# Patient Record
Sex: Female | Born: 1950 | Race: White | Hispanic: No | Marital: Married | Smoking: Former smoker
Health system: Southern US, Community
[De-identification: ages and names within clinical notes are randomized; demographics above are authoritative.]

## PROBLEM LIST (undated history)

## (undated) ENCOUNTER — Ambulatory Visit (HOSPITAL_COMMUNITY): Admission: EM | Discharge status: Absent without leave | Payer: 59 | Source: Home / Self Care

## (undated) DIAGNOSIS — B9681 Helicobacter pylori [H. pylori] as the cause of diseases classified elsewhere: Secondary | ICD-10-CM

## (undated) DIAGNOSIS — K2970 Gastritis, unspecified, without bleeding: Secondary | ICD-10-CM

## (undated) DIAGNOSIS — C539 Malignant neoplasm of cervix uteri, unspecified: Secondary | ICD-10-CM

## (undated) DIAGNOSIS — M810 Age-related osteoporosis without current pathological fracture: Secondary | ICD-10-CM

## (undated) DIAGNOSIS — I7381 Erythromelalgia: Secondary | ICD-10-CM

## (undated) DIAGNOSIS — G629 Polyneuropathy, unspecified: Secondary | ICD-10-CM

## (undated) DIAGNOSIS — Z923 Personal history of irradiation: Secondary | ICD-10-CM

## (undated) DIAGNOSIS — S0240DA Maxillary fracture, left side, initial encounter for closed fracture: Secondary | ICD-10-CM

## (undated) DIAGNOSIS — B029 Zoster without complications: Secondary | ICD-10-CM

## (undated) DIAGNOSIS — M069 Rheumatoid arthritis, unspecified: Secondary | ICD-10-CM

## (undated) DIAGNOSIS — S82141A Displaced bicondylar fracture of right tibia, initial encounter for closed fracture: Secondary | ICD-10-CM

## (undated) DIAGNOSIS — M19049 Primary osteoarthritis, unspecified hand: Secondary | ICD-10-CM

## (undated) DIAGNOSIS — J189 Pneumonia, unspecified organism: Secondary | ICD-10-CM

## (undated) DIAGNOSIS — H269 Unspecified cataract: Secondary | ICD-10-CM

## (undated) DIAGNOSIS — N950 Postmenopausal bleeding: Secondary | ICD-10-CM

## (undated) DIAGNOSIS — Z9889 Other specified postprocedural states: Secondary | ICD-10-CM

## (undated) DIAGNOSIS — Z86010 Personal history of colonic polyps: Secondary | ICD-10-CM

## (undated) DIAGNOSIS — M11279 Other chondrocalcinosis, unspecified ankle and foot: Secondary | ICD-10-CM

## (undated) DIAGNOSIS — K219 Gastro-esophageal reflux disease without esophagitis: Secondary | ICD-10-CM

## (undated) DIAGNOSIS — I10 Essential (primary) hypertension: Secondary | ICD-10-CM

## (undated) DIAGNOSIS — R112 Nausea with vomiting, unspecified: Secondary | ICD-10-CM

## (undated) DIAGNOSIS — T7840XA Allergy, unspecified, initial encounter: Secondary | ICD-10-CM

## (undated) DIAGNOSIS — M8580 Other specified disorders of bone density and structure, unspecified site: Secondary | ICD-10-CM

## (undated) HISTORY — DX: Essential (primary) hypertension: I10

## (undated) HISTORY — DX: Gastro-esophageal reflux disease without esophagitis: K21.9

## (undated) HISTORY — PX: INNER EAR SURGERY: SHX679

## (undated) HISTORY — DX: Other chondrocalcinosis, unspecified ankle and foot: M11.279

## (undated) HISTORY — DX: Rheumatoid arthritis, unspecified: M06.9

## (undated) HISTORY — DX: Personal history of colonic polyps: Z86.010

## (undated) HISTORY — DX: Gastritis, unspecified, without bleeding: K29.70

## (undated) HISTORY — DX: Helicobacter pylori (H. pylori) as the cause of diseases classified elsewhere: B96.81

## (undated) HISTORY — DX: Age-related osteoporosis without current pathological fracture: M81.0

## (undated) HISTORY — DX: Displaced bicondylar fracture of right tibia, initial encounter for closed fracture: S82.141A

## (undated) HISTORY — DX: Maxillary fracture, left side, initial encounter for closed fracture: S02.40DA

## (undated) HISTORY — DX: Allergy, unspecified, initial encounter: T78.40XA

## (undated) HISTORY — DX: Unspecified cataract: H26.9

## (undated) HISTORY — PX: FRACTURE SURGERY: SHX138

## (undated) HISTORY — DX: Other specified disorders of bone density and structure, unspecified site: M85.80

## (undated) HISTORY — PX: COLONOSCOPY: SHX174

## (undated) HISTORY — DX: Primary osteoarthritis, unspecified hand: M19.049

## (undated) HISTORY — PX: COSMETIC SURGERY: SHX468

---

## 1970-08-19 HISTORY — PX: BREAST BIOPSY: SHX20

## 1998-05-31 ENCOUNTER — Encounter: Payer: Self-pay | Admitting: Family Medicine

## 1998-05-31 ENCOUNTER — Ambulatory Visit (HOSPITAL_COMMUNITY): Admission: RE | Admit: 1998-05-31 | Discharge: 1998-05-31 | Payer: Self-pay | Admitting: Family Medicine

## 1998-06-06 ENCOUNTER — Ambulatory Visit (HOSPITAL_COMMUNITY): Admission: RE | Admit: 1998-06-06 | Discharge: 1998-06-06 | Payer: Self-pay | Admitting: Family Medicine

## 1998-06-06 ENCOUNTER — Encounter: Payer: Self-pay | Admitting: Family Medicine

## 1999-11-26 ENCOUNTER — Other Ambulatory Visit: Admission: RE | Admit: 1999-11-26 | Discharge: 1999-11-26 | Payer: Self-pay | Admitting: *Deleted

## 2000-08-22 ENCOUNTER — Encounter: Admission: RE | Admit: 2000-08-22 | Discharge: 2000-08-22 | Payer: Self-pay | Admitting: Family Medicine

## 2000-08-22 ENCOUNTER — Encounter: Payer: Self-pay | Admitting: Family Medicine

## 2000-09-08 ENCOUNTER — Ambulatory Visit (HOSPITAL_BASED_OUTPATIENT_CLINIC_OR_DEPARTMENT_OTHER): Admission: RE | Admit: 2000-09-08 | Discharge: 2000-09-08 | Payer: Self-pay | Admitting: General Surgery

## 2000-12-17 ENCOUNTER — Other Ambulatory Visit: Admission: RE | Admit: 2000-12-17 | Discharge: 2000-12-17 | Payer: Self-pay | Admitting: *Deleted

## 2001-02-04 ENCOUNTER — Encounter: Admission: RE | Admit: 2001-02-04 | Discharge: 2001-02-04 | Payer: Self-pay | Admitting: Family Medicine

## 2001-02-04 ENCOUNTER — Encounter: Payer: Self-pay | Admitting: Family Medicine

## 2004-05-09 ENCOUNTER — Emergency Department (HOSPITAL_COMMUNITY): Admission: EM | Admit: 2004-05-09 | Discharge: 2004-05-09 | Payer: Self-pay | Admitting: Emergency Medicine

## 2005-08-19 HISTORY — PX: OTHER SURGICAL HISTORY: SHX169

## 2005-11-07 ENCOUNTER — Inpatient Hospital Stay (HOSPITAL_COMMUNITY): Admission: EM | Admit: 2005-11-07 | Discharge: 2005-11-09 | Payer: Self-pay | Admitting: Emergency Medicine

## 2005-11-08 ENCOUNTER — Ambulatory Visit: Payer: Self-pay | Admitting: Cardiology

## 2005-11-08 ENCOUNTER — Encounter: Payer: Self-pay | Admitting: Cardiology

## 2007-09-30 ENCOUNTER — Encounter: Admission: RE | Admit: 2007-09-30 | Discharge: 2007-09-30 | Payer: Self-pay | Admitting: Family Medicine

## 2008-08-19 HISTORY — PX: OTHER SURGICAL HISTORY: SHX169

## 2008-11-16 ENCOUNTER — Emergency Department (HOSPITAL_COMMUNITY): Admission: EM | Admit: 2008-11-16 | Discharge: 2008-11-16 | Payer: Self-pay | Admitting: Emergency Medicine

## 2009-04-19 ENCOUNTER — Encounter: Admission: RE | Admit: 2009-04-19 | Discharge: 2009-04-19 | Payer: Self-pay | Admitting: Family Medicine

## 2009-11-18 ENCOUNTER — Ambulatory Visit: Payer: Self-pay | Admitting: Diagnostic Radiology

## 2009-11-18 ENCOUNTER — Encounter: Payer: Self-pay | Admitting: Emergency Medicine

## 2009-11-18 ENCOUNTER — Inpatient Hospital Stay (HOSPITAL_COMMUNITY): Admission: EM | Admit: 2009-11-18 | Discharge: 2009-11-21 | Payer: Self-pay | Admitting: Emergency Medicine

## 2010-02-09 ENCOUNTER — Ambulatory Visit (HOSPITAL_COMMUNITY): Admission: RE | Admit: 2010-02-09 | Discharge: 2010-02-09 | Payer: Self-pay | Admitting: Otolaryngology

## 2010-03-29 ENCOUNTER — Ambulatory Visit (HOSPITAL_COMMUNITY): Admission: RE | Admit: 2010-03-29 | Discharge: 2010-03-29 | Payer: Self-pay | Admitting: Otolaryngology

## 2010-08-02 ENCOUNTER — Ambulatory Visit: Payer: Self-pay | Admitting: Vascular Surgery

## 2010-09-09 ENCOUNTER — Encounter: Payer: Self-pay | Admitting: Otolaryngology

## 2010-11-02 LAB — CBC
HCT: 44 % (ref 36.0–46.0)
Hemoglobin: 15.5 g/dL — ABNORMAL HIGH (ref 12.0–15.0)
MCH: 28.2 pg (ref 26.0–34.0)
MCHC: 35.2 g/dL (ref 30.0–36.0)
MCV: 80.1 fL (ref 78.0–100.0)
Platelets: 216 K/uL (ref 150–400)
RBC: 5.49 MIL/uL — ABNORMAL HIGH (ref 3.87–5.11)
RDW: 13.3 % (ref 11.5–15.5)
WBC: 6.9 10*3/uL (ref 4.0–10.5)

## 2010-11-02 LAB — BASIC METABOLIC PANEL
BUN: 10 mg/dL (ref 6–23)
CO2: 29 mEq/L (ref 19–32)
Calcium: 9.8 mg/dL (ref 8.4–10.5)
Chloride: 102 mEq/L (ref 96–112)
Creatinine, Ser: 0.91 mg/dL (ref 0.4–1.2)
GFR calc Af Amer: >60 mL/min (ref >60)
GFR calc non Af Amer: >60 mL/min (ref >60)
Glucose, Bld: 111 mg/dL — ABNORMAL HIGH (ref 70–99)
Potassium: 3.5 mEq/L (ref 3.5–5.1)
Sodium: 138 mEq/L (ref 135–145)

## 2010-11-02 LAB — URINALYSIS, ROUTINE W REFLEX MICROSCOPIC
Bilirubin Urine: NEGATIVE
Glucose, UA: NEGATIVE mg/dL
Hgb urine dipstick: NEGATIVE
Ketones, ur: NEGATIVE mg/dL
Nitrite: NEGATIVE
Protein, ur: NEGATIVE mg/dL
Specific Gravity, Urine: 1.016 (ref 1.005–1.030)
Urobilinogen, UA: 0.2 mg/dL (ref 0.0–1.0)
pH: 5 (ref 5.0–8.0)

## 2010-11-02 LAB — URINE MICROSCOPIC-ADD ON

## 2010-11-02 LAB — SURGICAL PCR SCREEN
MRSA, PCR: NEGATIVE
Staphylococcus aureus: NEGATIVE

## 2010-11-04 LAB — CREATININE, SERUM
Creatinine, Ser: 0.88 mg/dL (ref 0.4–1.2)
GFR calc Af Amer: >60 mL/min (ref >60)
GFR calc non Af Amer: >60 mL/min (ref >60)

## 2010-11-04 LAB — BUN: BUN: 11 mg/dL (ref 6–23)

## 2010-11-07 LAB — CBC
HCT: 42.5 % (ref 36.0–46.0)
Hemoglobin: 14.5 g/dL (ref 12.0–15.0)
MCV: 84.8 fL (ref 78.0–100.0)
Platelets: 210 10*3/uL (ref 150–400)
RDW: 13.7 % (ref 11.5–15.5)
WBC: 8.8 10*3/uL (ref 4.0–10.5)

## 2010-11-07 LAB — DIFFERENTIAL
Eosinophils Absolute: 0 10*3/uL (ref 0.0–0.7)
Lymphocytes Relative: 12 % (ref 12–46)
Lymphs Abs: 1.1 10*3/uL (ref 0.7–4.0)
Monocytes Absolute: 0.3 10*3/uL (ref 0.1–1.0)
Monocytes Relative: 3 % (ref 3–12)
Neutrophils Relative %: 85 % — ABNORMAL HIGH (ref 43–77)

## 2010-11-07 LAB — BASIC METABOLIC PANEL
CO2: 25 mEq/L (ref 19–32)
Creatinine, Ser: 0.84 mg/dL (ref 0.4–1.2)
GFR calc Af Amer: >60 mL/min (ref >60)

## 2010-11-29 LAB — CBC
Hemoglobin: 14.9 g/dL (ref 12.0–15.0)
MCHC: 34.4 g/dL (ref 30.0–36.0)
MCV: 84.8 fL (ref 78.0–100.0)
Platelets: 228 10*3/uL (ref 150–400)
RDW: 13 % (ref 11.5–15.5)

## 2010-11-29 LAB — POCT CARDIAC MARKERS
Myoglobin, poc: 58.9 ng/mL (ref 12–200)
Troponin i, poc: <0.05 ng/mL (ref 0.00–0.09)

## 2010-11-29 LAB — BASIC METABOLIC PANEL
Chloride: 98 mEq/L (ref 96–112)
GFR calc Af Amer: >60 mL/min (ref >60)
GFR calc non Af Amer: >60 mL/min (ref >60)
Sodium: 133 mEq/L — ABNORMAL LOW (ref 135–145)

## 2010-11-29 LAB — DIFFERENTIAL
Basophils Relative: 1 % (ref 0–1)
Eosinophils Absolute: 0 10*3/uL (ref 0.0–0.7)
Monocytes Relative: 4 % (ref 3–12)
Neutro Abs: 5.3 10*3/uL (ref 1.7–7.7)
Neutrophils Relative %: 72 % (ref 43–77)

## 2011-01-01 NOTE — Procedures (Signed)
DUPLEX DEEP VENOUS EXAM - LOWER EXTREMITY   INDICATION:  Bilateral lower extremity edema.   HISTORY:  Edema:  Yes.  Trauma/Surgery:  No.  Pain:  No.  PE:  No.  Previous DVT:  No.  Anticoagulants:  No.  Other:   DUPLEX EXAM:                CFV   SFV   PopV  PTV    GSV                R  L  R  L  R  L  R   L  R  L  Thrombosis    o  o  o  o  o  o  o   o  o  o  Spontaneous   +  +  +  +  +  +  +   +  +  +  Phasic        +  +  +  +  +  +  +   +  +  +  Augmentation  +  +  +  +  +  +  +   +  +  +  Compressible  +  +  +  +  +  +  +   +  +  +  Competent     o  o  +  +  +  +  +   +  +  +   Legend:  + - yes  o - no  p - partial  D - decreased   IMPRESSION:  1. Bilateral lower extremities appear negative for deep venous      thrombosis.  2. Bilateral deep venous insufficiency noted at the level of the      common femoral vein.    _____________________________  Janetta Hora. Fields, MD   EM/MEDQ  D:  08/02/2010  T:  08/02/2010  Job:  [id number]

## 2011-01-01 NOTE — Assessment & Plan Note (Signed)
OFFICE VISIT   [name], [name]  DOB:  [birth year]                                       08/02/2010  ZOXWR#:[id number]   CHIEF COMPLAINT:  Leg swelling.   HISTORY OF PRESENT ILLNESS:  The patient is a 60 year old female who has  a chronic history of bilateral lower extremity swelling for several  years.  She also occasionally has some knee pain.  She does not really  describe any claudication symptoms.  She occasionally has some burning  in the front of her foot and toes which usually occurs in the evenings.  She denies any history of diabetes.  She states that the swelling has  slowly become worse over the years as well as the burning and tingling  in her toes.  She denies prior lower extremity trauma.  She denies prior  history of DVT.  She currently is on diuretics to try to improve her leg  swelling and has marginal relief with this.  She has not been as active  in the past year due to a recent injury in her forearm.  She was more  active riding a bicycle prior to this.   CHRONIC MEDICAL PROBLEMS:  Include hypertension.  She is currently on  medication and followed by Dr. Ludwig Clarks for this.   PAST SURGICAL HISTORY:  She had an ear perforation repaired in August of  2011.  She had a left wrist fracture plated in April of 2011 and she had  a right wrist operation in 2007.   SOCIAL HISTORY:  She is an Tree surgeon.  She is married.  She has three  children.  She is a former smoker but quit over 20 years ago.  She does  not consume alcohol regularly.   FAMILY HISTORY:  She denies any history of early atherosclerosis or  abdominal aortic aneurysm.   REVIEW OF SYSTEMS:  Pertinent for recent weight gain.  She also  currently is 5 feet 6 inches, 222 pounds.  CARDIAC:  She has had some palpitations in the past.  URINARY:  She has some occasional dysuria and difficulty urinating.  MUSCULOSKELETAL:  She has joint pain in her knees.  All other systems are  negative.  Please see intake referral form for  details.   MEDICATIONS:  1. Metoprolol 50 mg 2 tablets twice a day.  2. Hydrochlorothiazide 25 mg once daily.  3. Lopid 2.5 mg once daily.  4. Aspirin 81 mg once a day.  5. Alprazolam p.r.n.  6. Naproxen 500 mg p.r.n.   ALLERGIES:  No known drug allergies.   PHYSICAL EXAM:  Vital signs:  Blood pressure is 147/90 in the left arm,  heart rate is 58 and regular, oxygen saturation is 100% on room air.  HEENT:  Unremarkable.  Neck:  Has 2+ carotid pulses without bruit.  Chest:  Clear to auscultation.  Cardiac:  Regular rate and rhythm  without murmur.  Abdomen:  Obese, soft, nontender, nondistended.  No  masses.  Extremities:  She has 2+ femoral, dorsalis pedis and posterior  tibial pulses bilaterally.  She has trace pretibial edema bilaterally.  She has a few clusters of varicosities on the left posterior calf.  She  has no obvious visible varicosities in the right calf.  Musculoskeletal:  She has no obvious major joint deformities.  Neurologic:  Shows  symmetric upper  extremity and lower extremity motor strength which is  5/5 and symmetric.  Sensation seems to be intact in both feet.  Skin:  Has no open ulcers or rashes.   She had a venous duplex ultrasound today which showed no evidence of  DVT.  She did have incompetence of the femoral vein bilaterally.  The  superficial veins did not have incompetence.   In summary, the patient has chronic intermittent leg swelling.  Some of  this is probably secondary to obesity.  I discussed with her today a  weight loss plan as well as increasing her exercise overall.  She also  does have deep vein incompetence and I also discussed with her that  primarily this is treated with medical therapy as there are really no  good surgical options for this.  In light of this I have placed her in  compression stockings today that she should wear as much as possible to  try to decrease the swelling in her  legs and also decrease the venous  back flow into her lower extremities for symptomatic relief.  She will  follow up with me on an as-needed basis.     Janetta Hora. Fields, MD  Electronically Signed   CEF/MEDQ  D:  08/02/2010  T:  08/03/2010  Job:  [id number]   cc:   Henri Medal, MD

## 2011-01-04 NOTE — Discharge Summary (Signed)
NAMEMICHELLE, WNEK NO.:  [id number]   MEDICAL RECORD NO.:  [id number]          PATIENT TYPE:  INP   LOCATION:  3737                         FACILITY:  [facility]   PHYSICIAN:  Nelma Rothman, MD   DATE OF BIRTH:  1950-10-27   DATE OF ADMISSION:  11/07/2005  DATE OF DISCHARGE:  11/09/2005                                 DISCHARGE SUMMARY   PRIMARY CARE PHYSICIAN:  [facility], Marian Sorrow, M.D.   DISCHARGE DIAGNOSES:  1.  Community-acquired pneumonia.  2.  Ventricular ectopy secondary to hypokalemia, resolved.  3.  Hypokalemia, likely secondary to diuretic.  4.  Hypertension.  5.  Dyslipidemia.   PROCEDURES:  The patient did undergo a 2-D echocardiogram on March23, 2007  which demonstrated overall normal ejection fraction being 55-60%.  There  were no regional wall motion abnormalities.  There was grade 1 diastolic  dysfunction and trace to mild aortic valvular regurgitation.  There was mild  mitral regurgitation and a mildly dilated left atrium.   LABORATORY DATA:  On admission, potassium was 3.3, magnesium was 2.0,  creatinine was 0.9.  BNP was mildly elevated at 336.  Total cholesterol 190,  triglycerides 106, HDL 34, LDL 135.  A TSH was 0.702.  Blood cultures are no  growth to date.   HISTORY AND PHYSICAL:  Please see dictated admission history and physical by  Dr. Angelena Sole on March22,2007 for details but briefly [name] is a very  pleasant 60 year old lady with a history of hypertension who presented  directly from the [facility] office for workup of increase  in ventricular ectopy in the setting of community-acquired pneumonia.   HOSPITAL COURSE:  [name] was admitted to the medical unit and monitored  on telemetry.  Chest x-ray done at her primary care physician's office  demonstrated a right middle and right lower lobe pneumonia.  For this, she  was started on intravenous Rocephin and azithromycin.  She did have  a  temperature up to 103.0 on the evening of admission, but has been afebrile  since.  She has remained on room air and has not required any oxygen  throughout the hospitalization.   More concerning to her were these extra heart beats.  EKG revealed normal  sinus rhythm with frequent premature ventricular complexes.  The same was  revealed on telemetry.  Electrolytes revealed a magnesium of 2.0 and low  potassium of 3.3.  TSH was within normal limits.  She was given a total of  80 mEq of potassium chloride over the course of 24 hours and with this the  ectopy was markedly decreased.  In fact, on the morning of discharge, she  had hardly any ectopy at all on telemetry.  Her potassium level had  increased to 3.5.  For completeness sake, a 2-D echocardiogram was done on  March23, 2007 with results as dictated above, but relatively normal.  She  does take Ziac at home for a history of blood pressure.  She was continued  on bisoprolol component of this, given its beta blocking activities, which I  do believe  is beneficial for her should this ventricular ectopy ever become  an issue again.  However, the hydrochlorothiazide component was held during  this hospitalization secondary to hypokalemia.  As this seemed to control  her blood pressure quite well, I think it would be reasonable to resume  taking her Ziac as previously, but with the addition of daily potassium  replacement.  I will discharge her home today in much improved condition  with 20 mEq daily of K-Dur.  She should report to [facility] sometime early next week to have her potassium level checked and  dose adjusted accordingly.  Also, for further cardiac risk stratification,  fasting lipid profile was checked and did demonstrate a mildly elevated LDL  in the 130s.  Given lack of significant family history, personal history of  heart disease, or diabetes, an LDL of 130 would be reasonable, however, the  patient will  discuss with her primary care Clinton Wahlberg possible initiation of  anti-cholesterol medications such as Statin.   DISCHARGE MEDICATIONS:  1.  Avelox 400 mg p.o. daily x7 days.  2.  K-Dur 20 mEq p.o. daily.  3.  She is to resume all of her home medications including Ziac 10/6.25 1      tablets p.o. daily.   She was instructed to return to [facility] sometime early  next week to have a potassium level checked.      Nelma Rothman, MD  Electronically Signed     RAR/MEDQ  D:  11/09/2005  T:  11/11/2005  Job:  [id number]   cc:   Marian Sorrow, M.D.  fax [phone]

## 2011-01-04 NOTE — H&P (Signed)
[name], [name] NO.:  [id number]   MEDICAL RECORD NO.:  [id number]          PATIENT TYPE:  INP   LOCATION:  3737                         FACILITY:  [facility]   PHYSICIAN:  Hettie Holstein, D.O.    DATE OF BIRTH:  [birth year]   DATE OF ADMISSION:  11/07/2005  DATE OF DISCHARGE:                                HISTORY & PHYSICAL   PRIMARY CARE PHYSICIAN:  [facility], Dr. Phillips Odor   HISTORY OF PRESENT ILLNESS:  This is a direct admission from office of Dr.  Phillips Odor.  [name] is a 60 year old female who has been feeling unwell for  the past week.  On Wednesday, she started with a sore throat and a postnasal  drip which has progressed.  She subsequently developed fevers and a  persistent cough, worsening since Sunday.  She had a temperature of 102. She  was seen in her primary care physician's office and found to have a right  middle lobe and right lower lobe infiltrate on her chest x-ray and irregular  heart rate and relative low blood pressure.  She was sent to [facility] for  admission and was seen in the triage area.   PAST MEDICAL HISTORY:  1.  History of hypertension.  2.  Dyslipidemia.  3.  History of implant.   Otherwise, no other known medical history.   PAST SURGICAL HISTORY:  1.  Gallbladder.  2.  Uterus.  3.  She had a colonoscopy several years ago due to some abdominal      discomfort, but this has resolved at this point.  4.  She had a mammogram about 4 years ago.   She has not had flu or Pneumovax.   MEDICATIONS:  She takes Ziac 10/6.25 daily.   ALLERGIES:  No known drug allergies.   SOCIAL HISTORY:  She is a nonsmoker; she quit over 20 years ago.  Denies  alcohol.  She is a self Optometrist.  She has three children.   FAMILY HISTORY:  Her mother died at age 74 due to complications of fall.  Father died at age 69 with a myocardial infarction.   REVIEW OF SYSTEMS:  She had been in her usual state of health up until  Wednesday.  No weight loss, no appetite change until recently.  Otherwise,  further review is unremarkable.   PHYSICAL EXAMINATION:  VITAL SIGNS: Were stable in the emergency department.  She did have a transient temperature of 102.  GENERAL: Awake, alert, in no acute distress.  HEENT:  Head normocephalic and atraumatic.  Extraocular movements intact.  NECK:  Supple, nontender.  No palpable thyromegaly or mass.  CARDIOVASCULAR:  Normal S1 and S2, though this was irregular.  LUNGS:  Diminished at the bases.  She had good air entry and no use of  accessory muscles on respirations.  There is no dullness to percussion.  ABDOMEN:  Soft, nontender.  EXTREMITIES:  No edema.  NEUROLOGIC:  Exam reveals the patient to be alert and oriented x3.   LABORATORY DATA:  None performed with exception of EKG which, as above,  revealed PVCs.  She had sinus rhythm, and she had right bundle branch block.   ASSESSMENT:  1.  Community-acquired pneumonia.  2.  Palpitations.  3.  Hyperlipidemia.  4.  Febrile illness due to the above.   PLAN:  At this time, we are going to check initial blood cultures prior to  antibiotics, check her electrolytes, follow her clinical course on telemetry  floor for the next 23 hours and perhaps check a 2-D echocardiogram.      Hettie Holstein, D.O.  Electronically Signed     ESS/MEDQ  D:  11/07/2005  T:  11/08/2005  Job:  [id number]   cc:   Corrie Mckusick, M.D.  Fax: [phone]

## 2011-01-04 NOTE — Op Note (Signed)
[place]  Patient:    [name], [name]                       MRN: [id number] Proc. Date: 09/08/00 Adm. Date:  [id number] Attending:  Janalyn Rouse CC:         Dario Guardian, M.D.   Operative Report  PREOPERATIVE DIAGNOSIS:  Intraductal papilloma of the right breast.  POSTOPERATIVE DIAGNOSIS:  Intraductal papilloma of the right breast.  PROCEDURE:  Excision of intraductal papilloma of the right breast.  SURGEON:  Rose Phi. Maple Hudson, M.D.  ANESTHESIA:  MAC.  DESCRIPTION OF PROCEDURE:  Patient placed on the operating table and the right breast prepped and draped in the usual fashion.  The nipple discharge came from the 7 oclock position, so a curvilinear circumareolar incision was outlined centered on that position.  The area was infiltrated with 1% Xylocaine with adrenalin.  An incision was made, and we dissected under the areola to the nipple and then excised the visible intraductal papilloma.  We also opened up the skin on the other side with a buttonhole, so all the incisions were closed with subcuticular 4-0 Monocryl and Dermabond.  Dressing applied.  The patient transferred to the recovery room in satisfactory condition, having tolerated the procedure well. DD:  09/08/00 TD:  09/08/00 Job: [id number]

## 2011-06-20 ENCOUNTER — Other Ambulatory Visit (HOSPITAL_COMMUNITY): Payer: Self-pay | Admitting: Orthopedic Surgery

## 2011-06-20 ENCOUNTER — Ambulatory Visit (HOSPITAL_COMMUNITY)
Admission: RE | Admit: 2011-06-20 | Discharge: 2011-06-20 | Disposition: A | Discharge status: Home / Self Care | Payer: 59 | Source: Ambulatory Visit | Attending: Orthopedic Surgery | Admitting: Orthopedic Surgery

## 2011-06-20 DIAGNOSIS — R52 Pain, unspecified: Secondary | ICD-10-CM

## 2011-06-20 DIAGNOSIS — Z01818 Encounter for other preprocedural examination: Secondary | ICD-10-CM | POA: Insufficient documentation

## 2011-10-10 ENCOUNTER — Encounter: Payer: Self-pay | Admitting: Internal Medicine

## 2011-10-10 ENCOUNTER — Ambulatory Visit (INDEPENDENT_AMBULATORY_CARE_PROVIDER_SITE_OTHER): Payer: 59 | Admitting: Internal Medicine

## 2011-10-10 DIAGNOSIS — E669 Obesity, unspecified: Secondary | ICD-10-CM

## 2011-10-10 DIAGNOSIS — Z1211 Encounter for screening for malignant neoplasm of colon: Secondary | ICD-10-CM

## 2011-10-10 DIAGNOSIS — I10 Essential (primary) hypertension: Secondary | ICD-10-CM | POA: Insufficient documentation

## 2011-10-10 DIAGNOSIS — M069 Rheumatoid arthritis, unspecified: Secondary | ICD-10-CM

## 2011-10-10 DIAGNOSIS — Z13820 Encounter for screening for osteoporosis: Secondary | ICD-10-CM

## 2011-10-10 MED ORDER — CALCIUM-MAGNESIUM-VITAMIN D ER 600-40-500 MG-MG-UNIT PO TB24: 4.0000 | ORAL_TABLET | Freq: Every day | ORAL | Status: DC

## 2011-10-10 MED ORDER — VITAMIN D 1000 UNITS PO TABS: 1000.0000 [IU] | ORAL_TABLET | Freq: Every day | ORAL | Status: DC

## 2011-10-10 NOTE — Progress Notes (Signed)
Subjective:    Patient ID: [name], female    DOB: 1951-06-16, 61 y.o.   MRN: [id number]  HPI  New patient to me, here today to establish care - previously followed at [facility] Reviewed chronic medical issues today  Rheumatoid Arthritis. Follows with rheumatology for same. On Plaquenil and sulfalazine for treatment of same  - also prior MTX. No acute joint flares or active synovitis symptoms  Hypertension. On ARB+HCT therapy with beta blocker for control of same. reports compliance with medications as prescribed. Denies adverse side effects related to medication  Anxiety. Uses Xanax when necessary for control of same symptoms - denies depression or panic attack symptoms. Never on SSRI therapy or counseling  Obesity. Reports will increase in weight since 2010 following decreased mobility with rheumatoid diagnosis. Exercise and weightbearing causes pain so patient avoids aerobic activity  Past Medical History  Diagnosis Date  . Hypertension   . Rheumatoid arthritis dx 2010  . Pseudogout of foot   . GERD (gastroesophageal reflux disease)    Family History  Problem Relation Age of Onset  . Hypertension Father   . Hypertension Sister     x 3  . Hypertension Brother   . Heart attack Father   . Hyperthyroidism Sister     x2, s/p RAI ablation  . Hypothyroidism Brother   . Stroke Mother    History  Substance Use Topics  . Smoking status: Former Smoker    Types: Cigarettes  . Smokeless tobacco: Former Neurosurgeon    Quit date: 08/19/1981  . Alcohol Use: No    Review of Systems Constitutional: Negative for fever or unexpected weight change.  Respiratory: Negative for cough and shortness of breath.   Cardiovascular: Negative for chest pain or palpitations.  Gastrointestinal: Negative for abdominal pain, no bowel changes.  Musculoskeletal: Negative for gait problem or joint swelling.  Skin: Negative for rash.  Neurological: Negative for dizziness or headache.    No other specific complaints in a complete review of systems (except as listed in HPI above).     Objective:   Physical Exam BP 112/78  Pulse 67  Temp(Src) 98.7 F (37.1 C) (Oral)  Ht 5\' 6"  (1.676 m)  Wt 266 lb (120.657 kg)  BMI 42.93 kg/m2  SpO2 97% Wt Readings from Last 3 Encounters:  10/10/11 266 lb (120.657 kg)   Constitutional: She is overweight, but appears well-developed and well-nourished. No distress.  HENT: Head: Normocephalic and atraumatic. Ears: L TM ok, right TM with scarring from prior rupture and graft repair, no erythema or effusion; Nose: Nose normal. Mouth/Throat: Oropharynx is clear and moist. No oropharyngeal exudate.  Eyes: Conjunctivae and EOM are normal. Pupils are equal, round, and reactive to light. No scleral icterus.  Neck: Thick. Normal range of motion. Neck supple. No JVD present. No thyromegaly present.  Cardiovascular: Normal rate, regular rhythm and normal heart sounds.  No murmur heard. No BLE edema. Pulmonary/Chest: Effort normal and breath sounds normal. No respiratory distress. She has no wheezes.  Abdominal: Soft. Bowel sounds are normal. She exhibits no distension. There is no tenderness. no masses Musculoskeletal: Well-healed scar over left wrist with dorsal plate. Otherwise Normal range of motion, no joint effusions or active synovitis in hands, wrist or ankles. No gross deformities Neurological: She is alert and oriented to person, place, and time. No cranial nerve deficit. Coordination normal.  Skin: Skin is warm and dry. No rash noted. No erythema.  Psychiatric: She has a normal mood and affect. Her  behavior is normal. Judgment and thought content normal.   No results found for this basename: WBC,  HGB,  HCT,  PLT,  GLUCOSE,  CHOL,  TRIG,  HDL,  LDLDIRECT,  LDLCALC,  ALT,  AST,  NA,  K,  CL,  CREATININE,  BUN,  CO2,  TSH,  PSA,  INR,  GLUF,  HGBA1C,  MICROALBUR       Assessment & Plan:  See problem list. Medications and labs reviewed  today.  Time spent with pt today 45 minutes, greater than 50% time spent counseling patient on RA, hypertension, weight and  medication review. Also review of prior records and ROI to obtain recent labs from other providers  Will refer to GI for screening colonoscopy and arrange screening DEXA 4 health maintenance. Review of her health maintenance - reports physical done by primary in [place] over past few months.

## 2011-10-10 NOTE — Patient Instructions (Signed)
It was good to see you today. We have reviewed your prior records including labs and tests today we will send to your prior provider(s) for "release of records" as discussed today -  Medications reviewed, no changes at this time. Call us when med refills are needed we'll make referral for DEXA bone density and for screening colonoscopy. Our office will contact you regarding appointment(s) once made. Please schedule followup in 6 months for blood pressure and weight check, call sooner if problems.

## 2011-10-10 NOTE — Assessment & Plan Note (Signed)
BP Readings from Last 3 Encounters:  10/10/11 112/78   The current medical regimen is effective;  continue present plan and medications. Send for ROI re: recent labs - lipids, Cr, glc

## 2011-10-10 NOTE — Assessment & Plan Note (Signed)
Parts increase in weight since 2010 the following decreased mobility with RAdiagnosis  Send for records to review TSH and other labs Encouraged water aerobics to minimize weightbearing in addition to slow walks to maintain bone health Encouragement and support offered, patient will check into insurance options to help treat same  Wt Readings from Last 3 Encounters:  10/10/11 266 lb (120.657 kg)

## 2011-10-10 NOTE — Assessment & Plan Note (Signed)
Diagnosed 2010 following complicated wrist fracture Follows with rheumatology for same, Deveshwar -  prior trial MTX poorly tolerated due to side effects (hair loss) Now on plaquenil and sulfasalazine - no active synovitis Send for release of information from rheumatologist regarding diagnosis, labs and meds

## 2011-10-14 ENCOUNTER — Other Ambulatory Visit: Payer: Self-pay | Admitting: Internal Medicine

## 2011-10-15 ENCOUNTER — Telehealth: Payer: Self-pay | Admitting: Internal Medicine

## 2011-10-15 NOTE — Telephone Encounter (Signed)
Received records from Dr.Stallings, forwarded 60 pages to Dr.Leschber on 10/15/11

## 2011-10-16 ENCOUNTER — Inpatient Hospital Stay: Admission: RE | Admit: 2011-10-16 | Payer: 59 | Source: Ambulatory Visit

## 2011-10-17 ENCOUNTER — Encounter: Payer: Self-pay | Admitting: Internal Medicine

## 2011-10-17 DIAGNOSIS — M8580 Other specified disorders of bone density and structure, unspecified site: Secondary | ICD-10-CM

## 2011-10-17 DIAGNOSIS — M19049 Primary osteoarthritis, unspecified hand: Secondary | ICD-10-CM

## 2011-10-17 DIAGNOSIS — M810 Age-related osteoporosis without current pathological fracture: Secondary | ICD-10-CM | POA: Insufficient documentation

## 2011-10-17 HISTORY — DX: Primary osteoarthritis, unspecified hand: M19.049

## 2011-10-17 HISTORY — DX: Other specified disorders of bone density and structure, unspecified site: M85.80

## 2011-10-23 ENCOUNTER — Ambulatory Visit (INDEPENDENT_AMBULATORY_CARE_PROVIDER_SITE_OTHER)
Admission: RE | Admit: 2011-10-23 | Discharge: 2011-10-23 | Disposition: A | Discharge status: Home / Self Care | Payer: 59 | Source: Ambulatory Visit | Attending: Internal Medicine | Admitting: Internal Medicine

## 2011-10-23 DIAGNOSIS — Z13820 Encounter for screening for osteoporosis: Secondary | ICD-10-CM

## 2011-11-18 ENCOUNTER — Encounter: Payer: Self-pay | Admitting: Internal Medicine

## 2011-11-20 ENCOUNTER — Ambulatory Visit (INDEPENDENT_AMBULATORY_CARE_PROVIDER_SITE_OTHER): Payer: 59 | Admitting: Internal Medicine

## 2011-11-20 ENCOUNTER — Encounter: Payer: Self-pay | Admitting: Internal Medicine

## 2011-11-20 VITALS — BP 120/80 | HR 66 | Temp 98.6°F | Ht 66.0 in | Wt 220.8 lb

## 2011-11-20 DIAGNOSIS — T148XXA Other injury of unspecified body region, initial encounter: Secondary | ICD-10-CM

## 2011-11-20 DIAGNOSIS — R109 Unspecified abdominal pain: Secondary | ICD-10-CM

## 2011-11-20 DIAGNOSIS — I10 Essential (primary) hypertension: Secondary | ICD-10-CM

## 2011-11-20 DIAGNOSIS — M069 Rheumatoid arthritis, unspecified: Secondary | ICD-10-CM

## 2011-11-20 MED ORDER — NAPROXEN SODIUM 220 MG PO TABS: 220.0000 mg | ORAL_TABLET | Freq: Two times a day (BID) | ORAL | Status: AC

## 2011-11-20 MED ORDER — ALPRAZOLAM 0.5 MG PO TABS: 0.5000 mg | ORAL_TABLET | Freq: Every day | ORAL | Status: DC | PRN

## 2011-11-20 MED ORDER — CYCLOBENZAPRINE HCL 5 MG PO TABS: 5.0000 mg | ORAL_TABLET | Freq: Three times a day (TID) | ORAL | Status: AC | PRN

## 2011-11-20 NOTE — Assessment & Plan Note (Signed)
Diagnosed 2010 following complicated wrist fracture Follows with rheumatology for same, Deveshwar -  prior trial MTX poorly tolerated due to side effects (hair loss) Now on plaquenil and sulfasalazine - no active synovitis Pt reports recent labs normal so will not repeat same at this time 

## 2011-11-20 NOTE — Progress Notes (Signed)
  Subjective:    Patient ID: [name], female    DOB: 09-16-1950, 61 y.o.   MRN: [id number]  Flank Pain This is a new problem. The current episode started 1 to 4 weeks ago. The problem occurs intermittently. The problem has been waxing and waning since onset. Pain location: R lateral side. The quality of the pain is described as aching, cramping and stabbing. The pain does not radiate. The pain is moderate. The pain is the same all the time. The symptoms are aggravated by position and twisting. Stiffness is present all day. Pertinent negatives include no abdominal pain, bladder incontinence, bowel incontinence, dysuria, fever, leg pain, numbness, paresthesias, weakness or weight loss. Risk factors include recent trauma. She has tried chiropractic manipulation (tylenol) for the symptoms. The treatment provided mild relief.   Past Medical History  Diagnosis Date  . Hypertension   . Rheumatoid arthritis dx 2010  . Pseudogout of foot   . GERD (gastroesophageal reflux disease)      Review of Systems  Constitutional: Negative for fever and weight loss.  Gastrointestinal: Negative for abdominal pain and bowel incontinence.  Genitourinary: Positive for flank pain. Negative for bladder incontinence and dysuria.  Neurological: Negative for weakness, numbness and paresthesias.       Objective:   Physical Exam  BP 120/80  Pulse 66  Temp(Src) 98.6 F (37 C) (Oral)  Ht 5\' 6"  (1.676 m)  Wt 220 lb 12.8 oz (100.154 kg)  BMI 35.64 kg/m2  SpO2 95% Wt Readings from Last 3 Encounters:  11/20/11 220 lb 12.8 oz (100.154 kg)  10/10/11 266 lb (120.657 kg)   Constitutional: She appears well-developed and well-nourished. No distress. Neck: Normal range of motion. Neck supple. No JVD present. No thyromegaly present.  Cardiovascular: Normal rate, regular rhythm and normal heart sounds.  No murmur heard. No BLE edema. Pulmonary/Chest: Effort normal and breath sounds normal. No respiratory distress.  She has no wheezes.  Abdominal: Soft. Bowel sounds are normal. She exhibits no distension. There is no tenderness. no masses Musculoskeletal: Back: full range of motion of thoracic and lumbar spine. Non tender to palpation. Negative straight leg raise. DTR's are symmetrically intact. Sensation intact in all dermatomes of the lower extremities. Full strength to manual muscle testing. patient is able to heel toe walk without difficulty and ambulates with antalgic gait. Skin: Skin is warm and dry. No rash noted. No erythema.  Psychiatric: She has a normal mood and affect. Her behavior is normal. Judgment and thought content normal.   No results found for this basename: WBC, HGB, HCT, PLT, GLUCOSE, CHOL, TRIG, HDL, LDLDIRECT, LDLCALC, ALT, AST, NA, K, CL, CREATININE, BUN, CO2, TSH, PSA, INR, GLUF, HGBA1C, MICROALBUR       Assessment & Plan:   R flank pain/side pain - suspect muscle strain from overuse - exam and hx otherwise benign Reports recent labs at rheum normal 10/22/11 - no GU symptoms  Treat with aleve bid, flexeril qhs and ice/heat Pt to call if worse or unimproved

## 2011-11-20 NOTE — Patient Instructions (Signed)
It was good to see you today. Will treat your muscle strain with Flexeril muscle relaxer at bedtime and Aleve twice a day for next 36 hours, then as needed Other medications reviewed, no changes. Refills provided as requested Your prescription(s) have been submitted to your pharmacy. Please take as directed and contact our office if you believe you are having problem(s) with the medication(s). If symptoms unimproved in next 7-10 days with this treatment, call for reevaluation. Call sooner if worse

## 2011-11-20 NOTE — Assessment & Plan Note (Signed)
BP Readings from Last 3 Encounters:  11/20/11 120/80  10/10/11 112/78   The current medical regimen is effective;  continue present plan and medications. I feel short term NSAIDs safe for MSkel pain despite risk of inc BP and fluid retension - Pt will call if problems

## 2011-12-23 ENCOUNTER — Encounter: Payer: Self-pay | Admitting: Gastroenterology

## 2012-03-19 HISTORY — PX: CATARACT EXTRACTION: SUR2

## 2012-04-09 ENCOUNTER — Ambulatory Visit: Payer: 59 | Admitting: Internal Medicine

## 2012-04-15 ENCOUNTER — Ambulatory Visit (INDEPENDENT_AMBULATORY_CARE_PROVIDER_SITE_OTHER): Payer: 59 | Admitting: Internal Medicine

## 2012-04-15 ENCOUNTER — Encounter: Payer: Self-pay | Admitting: Internal Medicine

## 2012-04-15 VITALS — BP 100/72 | HR 68 | Temp 98.6°F | Ht 66.0 in | Wt 214.1 lb

## 2012-04-15 DIAGNOSIS — E669 Obesity, unspecified: Secondary | ICD-10-CM

## 2012-04-15 DIAGNOSIS — M069 Rheumatoid arthritis, unspecified: Secondary | ICD-10-CM

## 2012-04-15 DIAGNOSIS — J309 Allergic rhinitis, unspecified: Secondary | ICD-10-CM

## 2012-04-15 DIAGNOSIS — I10 Essential (primary) hypertension: Secondary | ICD-10-CM

## 2012-04-15 DIAGNOSIS — Z1239 Encounter for other screening for malignant neoplasm of breast: Secondary | ICD-10-CM

## 2012-04-15 DIAGNOSIS — Z1211 Encounter for screening for malignant neoplasm of colon: Secondary | ICD-10-CM

## 2012-04-15 DIAGNOSIS — Z1231 Encounter for screening mammogram for malignant neoplasm of breast: Secondary | ICD-10-CM

## 2012-04-15 MED ORDER — LORATADINE 10 MG PO TABS: 10.0000 mg | ORAL_TABLET | Freq: Every day | ORAL | Status: DC | PRN

## 2012-04-15 MED ORDER — LOSARTAN POTASSIUM-HCTZ 100-25 MG PO TABS: 1.0000 | ORAL_TABLET | Freq: Every day | ORAL | Status: DC

## 2012-04-15 MED ORDER — METOPROLOL SUCCINATE ER 50 MG PO TB24: 100.0000 mg | ORAL_TABLET | Freq: Every day | ORAL | Status: DC

## 2012-04-15 NOTE — Assessment & Plan Note (Signed)
BP Readings from Last 3 Encounters:  04/15/12 100/72  11/20/11 120/80  10/10/11 112/78   The current medical regimen is effective;  continue present plan and medications. I feel short term NSAIDs safe for occasional MSkel pain despite risk of inc BP and fluid retention - Pt will call if problems

## 2012-04-15 NOTE — Progress Notes (Signed)
  Subjective:    Patient ID: [name], female    DOB: 1950/08/25, 61 y.o.   MRN: [id number]  HPI  Here for follow up - reviewed chronic medical issues today  Rheumatoid Arthritis. Follows with rheumatology for same. On Plaquenil and sulfalazine for treatment of same  - also prior MTX. No acute joint flares or active synovitis symptoms  Hypertension. On ARB+HCT therapy with beta blocker for control of same. reports compliance with medications as prescribed. Denies adverse side effects related to medication  Anxiety. Uses Xanax when necessary for control of same symptoms - denies depression or panic attack symptoms. Never on SSRI therapy or counseling  Obesity. Reports increase in weight since 2010 following decreased mobility with rheumatoid diagnosis. Exercise and weightbearing caused pain so patient avoided aerobic activity - but now improving - weight loss reviewed  Past Medical History  Diagnosis Date  . Hypertension   . Rheumatoid arthritis dx 2010  . Pseudogout of foot   . GERD (gastroesophageal reflux disease)     Review of Systems  Constitutional: Negative for fever or unexpected weight change.  Respiratory: Negative for cough and shortness of breath.   Cardiovascular: Negative for chest pain or palpitations.      Objective:   Physical Exam  BP 100/72  Pulse 68  Temp 98.6 F (37 C) (Oral)  Ht 5\' 6"  (1.676 m)  Wt 214 lb 1.9 oz (97.124 kg)  BMI 34.56 kg/m2  SpO2 97% Wt Readings from Last 3 Encounters:  04/15/12 214 lb 1.9 oz (97.124 kg)  11/20/11 220 lb 12.8 oz (100.154 kg)  10/10/11 266 lb (120.657 kg)   Constitutional: She is overweight, but appears well-developed and well-nourished. No distress.   Neck: Thick. Normal range of motion. Neck supple. No JVD present. No thyromegaly present.  Cardiovascular: Normal rate, regular rhythm and normal heart sounds.  No murmur heard. No BLE edema. Pulmonary/Chest: Effort normal and breath sounds normal. No  respiratory distress. She has no wheezes.  Psychiatric: She has a normal mood and affect. Her behavior is normal. Judgment and thought content normal.   Lab Results  Component Value Date   WBC 6.9 03/23/2010   HGB 15.5* 03/23/2010   HCT 44.0 03/23/2010   PLT 216 03/23/2010   GLUCOSE 111* 03/23/2010   NA 138 03/23/2010   K 3.5 03/23/2010   CL 102 03/23/2010   CREATININE 0.91 03/23/2010   BUN 10 03/23/2010   CO2 29 03/23/2010       Assessment & Plan:  See problem list. Medications and labs reviewed today.  allergic rhinitis with allergic sinusitis symptoms - recommended OTC meds

## 2012-04-15 NOTE — Assessment & Plan Note (Signed)
Diagnosed 2010 following complicated wrist fracture Follows with rheumatology for same, Deveshwar -  prior trial MTX poorly tolerated due to side effects (hair loss) Now on plaquenil and sulfasalazine - no active synovitis Pt reports recent labs normal so will not repeat same at this time

## 2012-04-15 NOTE — Patient Instructions (Addendum)
It was good to see you today. We have reviewed your interval medical history including labs and tests today Medications reviewed, no changes at this time. Refill on medication(s) as discussed today. Use claritin as needed for allergy and sinus SUBJECTIVE:  Please schedule followup in 6-12 months for physical and blood pressure and weight check, call sooner if problems. we'll make referral for mammogram and colon screening . Our office will contact you regarding appointment(s) once made.

## 2012-04-15 NOTE — Assessment & Plan Note (Signed)
increase in weight since 2010 the following decreased mobility with RA diagnosis  Now successfully working on diet/exercise Encouraged continued water aerobics to minimize weightbearing in addition to slow walks to maintain bone health Encouragement and support offered  Wt Readings from Last 3 Encounters:  04/15/12 214 lb 1.9 oz (97.124 kg)  11/20/11 220 lb 12.8 oz (100.154 kg)  10/10/11 266 lb (120.657 kg)

## 2012-04-16 ENCOUNTER — Encounter: Payer: Self-pay | Admitting: Gastroenterology

## 2012-05-29 ENCOUNTER — Encounter: Payer: 59 | Admitting: Gastroenterology

## 2012-07-15 ENCOUNTER — Other Ambulatory Visit: Payer: Self-pay | Admitting: Internal Medicine

## 2012-08-17 ENCOUNTER — Telehealth: Payer: Self-pay | Admitting: *Deleted

## 2012-08-17 DIAGNOSIS — N644 Mastodynia: Secondary | ICD-10-CM

## 2012-08-17 NOTE — Telephone Encounter (Signed)
[facility] called stated [facility] had [facility] about [facility] to be scheduled and asked if she had any problems. Stated she told them yes that her left breast was sore. Patient was told that her doctor would need to send in another type of order for her mamogram for this. Please advise. Patient call back # [phone].

## 2012-08-18 ENCOUNTER — Telehealth: Payer: Self-pay | Admitting: *Deleted

## 2012-08-18 NOTE — Telephone Encounter (Signed)
SPOKE WITH RECEPTIONIST AT [facility]. THEY WILL CALL PATIENT AND SCHEDULE TEST WITH PATIENT.

## 2012-08-18 NOTE — Telephone Encounter (Signed)
Dx mammo for left breast pain ordered lease notify them of same

## 2012-08-18 NOTE — Telephone Encounter (Signed)
A user error has taken place: closed for admin

## 2012-08-26 ENCOUNTER — Ambulatory Visit
Admission: RE | Admit: 2012-08-26 | Discharge: 2012-08-26 | Disposition: A | Discharge status: Home / Self Care | Payer: 59 | Source: Ambulatory Visit | Attending: Internal Medicine | Admitting: Internal Medicine

## 2012-08-26 DIAGNOSIS — N644 Mastodynia: Secondary | ICD-10-CM

## 2012-10-29 ENCOUNTER — Other Ambulatory Visit: Payer: Self-pay | Admitting: Internal Medicine

## 2012-10-31 ENCOUNTER — Other Ambulatory Visit: Payer: Self-pay | Admitting: Internal Medicine

## 2013-01-19 ENCOUNTER — Other Ambulatory Visit: Payer: Self-pay | Admitting: Internal Medicine

## 2013-01-19 NOTE — Telephone Encounter (Signed)
Faxed script back to [facility].../lmb 

## 2013-03-29 ENCOUNTER — Other Ambulatory Visit: Payer: Self-pay | Admitting: Internal Medicine

## 2013-04-28 ENCOUNTER — Other Ambulatory Visit: Payer: Self-pay | Admitting: Internal Medicine

## 2013-05-10 ENCOUNTER — Ambulatory Visit (INDEPENDENT_AMBULATORY_CARE_PROVIDER_SITE_OTHER): Payer: 59 | Admitting: Internal Medicine

## 2013-05-10 ENCOUNTER — Encounter: Payer: Self-pay | Admitting: Internal Medicine

## 2013-05-10 VITALS — BP 120/72 | HR 68 | Temp 98.6°F | Wt 214.0 lb

## 2013-05-10 DIAGNOSIS — M069 Rheumatoid arthritis, unspecified: Secondary | ICD-10-CM

## 2013-05-10 DIAGNOSIS — Z0000 Encounter for general adult medical examination without abnormal findings: Secondary | ICD-10-CM

## 2013-05-10 DIAGNOSIS — I10 Essential (primary) hypertension: Secondary | ICD-10-CM

## 2013-05-10 DIAGNOSIS — M899 Disorder of bone, unspecified: Secondary | ICD-10-CM

## 2013-05-10 DIAGNOSIS — Z1211 Encounter for screening for malignant neoplasm of colon: Secondary | ICD-10-CM

## 2013-05-10 DIAGNOSIS — M8580 Other specified disorders of bone density and structure, unspecified site: Secondary | ICD-10-CM

## 2013-05-10 DIAGNOSIS — E669 Obesity, unspecified: Secondary | ICD-10-CM

## 2013-05-10 MED ORDER — METOPROLOL SUCCINATE ER 50 MG PO TB24: ORAL_TABLET | ORAL | Status: DC

## 2013-05-10 MED ORDER — LOSARTAN POTASSIUM-HCTZ 100-25 MG PO TABS: ORAL_TABLET | ORAL | Status: DC

## 2013-05-10 MED ORDER — PREGABALIN 25 MG PO CAPS: 25.0000 mg | ORAL_CAPSULE | Freq: Three times a day (TID) | ORAL | Status: DC

## 2013-05-10 NOTE — Patient Instructions (Addendum)
It was good to see you today. Health Maintenance reviewed - refer for colonoscopy for colon cancer screening- all other recommended immunizations and age-appropriate screenings are up-to-date. Test(s) ordered today. Your results will be released to MyChart (or called to you) after review, usually within 72hours after test completion. If any changes need to be made, you will be notified at that same time. Medications reviewed and updated, begin Lyrica low-dose 25 mg at bedtime, up to 3 times daily for pain -no other changes recommended at this time. Refill on other medications provided as requested Continue working with your other specialists as discussed Proceed with a flu shot this fall if okay with rheumatology Please schedule followup in 12 months for annual medical wellness exam and labs, call sooner if problems.  Health Maintenance, Females A healthy lifestyle and preventative care can promote health and wellness.  Maintain regular health, dental, and eye exams.  Eat a healthy diet. Foods like vegetables, fruits, whole grains, low-fat dairy products, and lean protein foods contain the nutrients you need without too many calories. Decrease your intake of foods high in solid fats, added sugars, and salt. Get information about a proper diet from your caregiver, if necessary.  Regular physical exercise is one of the most important things you can do for your health. Most adults should get at least 150 minutes of moderate-intensity exercise (any activity that increases your heart rate and causes you to sweat) each week. In addition, most adults need muscle-strengthening exercises on 2 or more days a week.   Maintain a healthy weight. The body mass index (BMI) is a screening tool to identify possible weight problems. It provides an estimate of body fat based on height and weight. Your caregiver can help determine your BMI, and can help you achieve or maintain a healthy weight. For adults 20 years  and older:  A BMI below 18.5 is considered underweight.  A BMI of 18.5 to 24.9 is normal.  A BMI of 25 to 29.9 is considered overweight.  A BMI of 30 and above is considered obese.  Maintain normal blood lipids and cholesterol by exercising and minimizing your intake of saturated fat. Eat a balanced diet with plenty of fruits and vegetables. Blood tests for lipids and cholesterol should begin at age 57 and be repeated every 5 years. If your lipid or cholesterol levels are high, you are over 50, or you are a high risk for heart disease, you may need your cholesterol levels checked more frequently.Ongoing high lipid and cholesterol levels should be treated with medicines if diet and exercise are not effective.  If you smoke, find out from your caregiver how to quit. If you do not use tobacco, do not start.  If you are pregnant, do not drink alcohol. If you are breastfeeding, be very cautious about drinking alcohol. If you are not pregnant and choose to drink alcohol, do not exceed 1 drink per day. One drink is considered to be 12 ounces (355 mL) of beer, 5 ounces (148 mL) of wine, or 1.5 ounces (44 mL) of liquor.  Avoid use of street drugs. Do not share needles with anyone. Ask for help if you need support or instructions about stopping the use of drugs.  High blood pressure causes heart disease and increases the risk of stroke. Blood pressure should be checked at least every 1 to 2 years. Ongoing high blood pressure should be treated with medicines, if weight loss and exercise are not effective.  If you are  60 to 64 years old, ask your caregiver if you should take aspirin to prevent strokes.  Diabetes screening involves taking a blood sample to check your fasting blood sugar level. This should be done once every 3 years, after age 68, if you are within normal weight and without risk factors for diabetes. Testing should be considered at a younger age or be carried out more frequently if you are  overweight and have at least 1 risk factor for diabetes.  Breast cancer screening is essential preventative care for women. You should practice "breast self-awareness." This means understanding the normal appearance and feel of your breasts and may include breast self-examination. Any changes detected, no matter how small, should be reported to a caregiver. Women in their 25s and 30s should have a clinical breast exam (CBE) by a caregiver as part of a regular health exam every 1 to 3 years. After age 75, women should have a CBE every year. Starting at age 80, women should consider having a mammogram (breast X-ray) every year. Women who have a family history of breast cancer should talk to their caregiver about genetic screening. Women at a high risk of breast cancer should talk to their caregiver about having an MRI and a mammogram every year.  The Pap test is a screening test for cervical cancer. Women should have a Pap test starting at age 69. Between ages 26 and 58, Pap tests should be repeated every 2 years. Beginning at age 16, you should have a Pap test every 3 years as long as the past 3 Pap tests have been normal. If you had a hysterectomy for a problem that was not cancer or a condition that could lead to cancer, then you no longer need Pap tests. If you are between ages 74 and 77, and you have had normal Pap tests going back 10 years, you no longer need Pap tests. If you have had past treatment for cervical cancer or a condition that could lead to cancer, you need Pap tests and screening for cancer for at least 20 years after your treatment. If Pap tests have been discontinued, risk factors (such as a new sexual partner) need to be reassessed to determine if screening should be resumed. Some women have medical problems that increase the chance of getting cervical cancer. In these cases, your caregiver may recommend more frequent screening and Pap tests.  The human papillomavirus (HPV) test is an  additional test that may be used for cervical cancer screening. The HPV test looks for the virus that can cause the cell changes on the cervix. The cells collected during the Pap test can be tested for HPV. The HPV test could be used to screen women aged 60 years and older, and should be used in women of any age who have unclear Pap test results. After the age of 47, women should have HPV testing at the same frequency as a Pap test.  Colorectal cancer can be detected and often prevented. Most routine colorectal cancer screening begins at the age of 49 and continues through age 17. However, your caregiver may recommend screening at an earlier age if you have risk factors for colon cancer. On a yearly basis, your caregiver may provide home test kits to check for hidden blood in the stool. Use of a small camera at the end of a tube, to directly examine the colon (sigmoidoscopy or colonoscopy), can detect the earliest forms of colorectal cancer. Talk to your caregiver about this  at age 33, when routine screening begins. Direct examination of the colon should be repeated every 5 to 10 years through age 64, unless early forms of pre-cancerous polyps or small growths are found.  Hepatitis C blood testing is recommended for all people born from [birth year] through 1967 and any individual with known risks for hepatitis C.  Practice safe sex. Use condoms and avoid high-risk sexual practices to reduce the spread of sexually transmitted infections (STIs). Sexually active women aged 45 and younger should be checked for Chlamydia, which is a common sexually transmitted infection. Older women with new or multiple partners should also be tested for Chlamydia. Testing for other STIs is recommended if you are sexually active and at increased risk.  Osteoporosis is a disease in which the bones lose minerals and strength with aging. This can result in serious bone fractures. The risk of osteoporosis can be identified using a bone  density scan. Women ages 2 and over and women at risk for fractures or osteoporosis should discuss screening with their caregivers. Ask your caregiver whether you should be taking a calcium supplement or vitamin D to reduce the rate of osteoporosis.  Menopause can be associated with physical symptoms and risks. Hormone replacement therapy is available to decrease symptoms and risks. You should talk to your caregiver about whether hormone replacement therapy is right for you.  Use sunscreen with a sun protection factor (SPF) of 30 or greater. Apply sunscreen liberally and repeatedly throughout the day. You should seek shade when your shadow is shorter than you. Protect yourself by wearing long sleeves, pants, a wide-brimmed hat, and sunglasses year round, whenever you are outdoors.  Notify your caregiver of new moles or changes in moles, especially if there is a change in shape or color. Also notify your caregiver if a mole is larger than the size of a pencil eraser.  Stay current with your immunizations. Document Released: 02/18/2011 Document Revised: 10/28/2011 Document Reviewed: 02/18/2011 [facility] Patient Information 2014 [place], [place].

## 2013-05-10 NOTE — Progress Notes (Signed)
Subjective:    Patient ID: [name], female    DOB: September 21, 1950, 62 y.o.   MRN: [id number]  HPI patient is here today for annual physical. Patient feels well overall Also reviewed chronic medical issues today  Rheumatoid Arthritis. Follows with rheumatology for same. currently on Humira every 2 weeks -Previously on Plaquenil, sulfalazine, and remote MTX. No acute joint flares or active synovitis symptoms. Chronic bilateral foot pain, worse at night. Associated with burning symptoms. Has been advised Lyrica by rheumatologist but has previously declined to initiate same  Hypertension. On ARB+HCT therapy with beta blocker for control of same. reports compliance with medications as prescribed. Denies adverse side effects related to medication  Anxiety. Uses Xanax when necessary for control of same symptoms - denies depression or panic attack symptoms. Never on SSRI therapy or counseling  Obesity. Reports increase in weight since 2010 following decreased mobility with rheumatoid diagnosis. Exercise and weightbearing caused pain so patient avoided aerobic activity - but now improving - weight loss reviewed  Past Medical History  Diagnosis Date  . Hypertension   . Rheumatoid arthritis(714.0) dx 2010  . Pseudogout of foot   . GERD (gastroesophageal reflux disease)    Family History  Problem Relation Age of Onset  . Hypertension Father   . Hypertension Sister     x 3  . Hypertension Brother   . Heart attack Father   . Hyperthyroidism Sister     x2, s/p RAI ablation  . Hypothyroidism Brother   . Stroke Mother    History  Substance Use Topics  . Smoking status: Former Smoker    Types: Cigarettes  . Smokeless tobacco: Former Neurosurgeon    Quit date: 08/19/1981  . Alcohol Use: No    Review of Systems Constitutional: Negative for fever or weight change.  Respiratory: Negative for cough and shortness of breath.   Cardiovascular: Negative for chest pain or palpitations.   Gastrointestinal: Negative for abdominal pain, no bowel changes.  Musculoskeletal: Negative for gait problem or joint swelling.  Skin: Negative for rash.  Neurological: Negative for dizziness or headache.  No other specific complaints in a complete review of systems (except as listed in HPI above).      Objective:   Physical Exam BP 120/72  Pulse 68  Temp(Src) 98.6 F (37 C) (Oral)  Wt 214 lb (97.07 kg)  BMI 34.56 kg/m2  SpO2 97% Wt Readings from Last 3 Encounters:  05/10/13 214 lb (97.07 kg)  04/15/12 214 lb 1.9 oz (97.124 kg)  11/20/11 220 lb 12.8 oz (100.154 kg)   Constitutional: She is overweight, but appears well-developed and well-nourished. No distress.    HENT: Head: Normocephalic and atraumatic. Ears: B TMs ok, no erythema or effusion; Nose: Nose normal. Mouth/Throat: Oropharynx is clear and moist. No oropharyngeal exudate.  Eyes: Conjunctivae and EOM are normal. Pupils are equal, round, and reactive to light. No scleral icterus.  Neck: Normal range of motion. Neck supple. No JVD present. No thyromegaly present.  Cardiovascular: Normal rate, regular rhythm and normal heart sounds.  No murmur heard. No BLE edema. Pulmonary/Chest: Effort normal and breath sounds normal. No respiratory distress. She has no wheezes.  Abdominal: Soft. Bowel sounds are normal. She exhibits no distension. There is no tenderness. no masses Musculoskeletal: Normal range of motion, no joint effusions. No gross deformities Neurological: She is alert and oriented to person, place, and time. No cranial nerve deficit. Coordination, balance, strength, speech and gait are normal.  Skin: Skin is warm and  dry. No rash noted. No erythema.  Psychiatric: She has a normal mood and affect. Her behavior is normal. Judgment and thought content normal.    Lab Results  Component Value Date   WBC 6.9 03/23/2010   HGB 15.5* 03/23/2010   HCT 44.0 03/23/2010   PLT 216 03/23/2010   GLUCOSE 111* 03/23/2010   NA 138 03/23/2010    K 3.5 03/23/2010   CL 102 03/23/2010   CREATININE 0.91 03/23/2010   BUN 10 03/23/2010   CO2 29 03/23/2010       Assessment & Plan:  CPX/v70.0 - Patient has been counseled on age-appropriate routine health concerns for screening and prevention. These are reviewed and up-to-date. Immunizations are up-to-date or declined. Labs ordered and reviewed.refer for colonoscopy  Also see problem list. Medications and labs reviewed today.

## 2013-05-10 NOTE — Assessment & Plan Note (Signed)
Prior DEXA scan and progression reviewed. Will check vitamin D level at recommendation of rheumatologist. Advised to continue weightbearing activity with calcium supplementation as prescribed

## 2013-05-10 NOTE — Assessment & Plan Note (Signed)
Diagnosed 2010 following complicated wrist fracture Follows with rheumatology for same, Deveshwar -  prior trial MTX poorly tolerated due to side effects (hair loss) Then unsuccessful management on plaquenil and sulfasalazine -  Currently, on Humira every 2 weeks -no active synovitis, but question overlapping peripheral neuropathy B feet Discussion the medication options available, the patient elects to initiate low-dose Lyrica for symptomatic management at this time - rx done First follow up with rheumatology next month as planned, call here sooner or rheumatology if problems

## 2013-05-10 NOTE — Assessment & Plan Note (Signed)
BP Readings from Last 3 Encounters:  05/10/13 120/72  04/15/12 100/72  11/20/11 120/80   The current medical regimen is effective;  continue present plan and medications. Ok for short term NSAIDs to tx occasional MSkel pain despite risk of inc BP and fluid retention -

## 2013-05-10 NOTE — Assessment & Plan Note (Signed)
increase in weight since 2010 the following decreased mobility with RA diagnosis  working on diet/exercise Encouraged continued water aerobics to minimize weightbearing in addition to slow walks to maintain bone health Encouragement and support offered  Wt Readings from Last 3 Encounters:  05/10/13 214 lb (97.07 kg)  04/15/12 214 lb 1.9 oz (97.124 kg)  11/20/11 220 lb 12.8 oz (100.154 kg)

## 2013-05-14 ENCOUNTER — Other Ambulatory Visit (INDEPENDENT_AMBULATORY_CARE_PROVIDER_SITE_OTHER): Payer: 59

## 2013-05-14 DIAGNOSIS — Z0000 Encounter for general adult medical examination without abnormal findings: Secondary | ICD-10-CM

## 2013-05-14 DIAGNOSIS — M8580 Other specified disorders of bone density and structure, unspecified site: Secondary | ICD-10-CM

## 2013-05-14 LAB — CBC WITH DIFFERENTIAL/PLATELET
Basophils Relative: 0.5 % (ref 0.0–3.0)
Eosinophils Absolute: 0.2 10*3/uL (ref 0.0–0.7)
Eosinophils Relative: 3.1 % (ref 0.0–5.0)
HCT: 42.1 % (ref 36.0–46.0)
Lymphocytes Relative: 41.4 % (ref 12.0–46.0)
Lymphs Abs: 2.3 10*3/uL (ref 0.7–4.0)
MCHC: 34.3 g/dL (ref 30.0–36.0)
MCV: 84.3 fl (ref 78.0–100.0)
Monocytes Absolute: 0.5 10*3/uL (ref 0.1–1.0)
Neutro Abs: 2.6 10*3/uL (ref 1.4–7.7)
RBC: 5 Mil/uL (ref 3.87–5.11)
WBC: 5.6 10*3/uL (ref 4.5–10.5)

## 2013-05-14 LAB — LDL CHOLESTEROL, DIRECT: Direct LDL: 146.3 mg/dL

## 2013-05-14 LAB — URINALYSIS, ROUTINE W REFLEX MICROSCOPIC
Bilirubin Urine: NEGATIVE
Hgb urine dipstick: NEGATIVE
Ketones, ur: NEGATIVE
Total Protein, Urine: NEGATIVE
Urine Glucose: 100

## 2013-05-14 LAB — LIPID PANEL
HDL: 49.9 mg/dL (ref >39.00)
Triglycerides: 117 mg/dL (ref 0.0–149.0)

## 2013-05-14 LAB — BASIC METABOLIC PANEL
CO2: 28 mEq/L (ref 19–32)
Calcium: 10.6 mg/dL — ABNORMAL HIGH (ref 8.4–10.5)
Chloride: 101 mEq/L (ref 96–112)
Creatinine, Ser: 1 mg/dL (ref 0.4–1.2)
Glucose, Bld: 105 mg/dL — ABNORMAL HIGH (ref 70–99)
Potassium: 4 mEq/L (ref 3.5–5.1)

## 2013-05-14 LAB — TSH: TSH: 1.03 u[IU]/mL (ref 0.35–5.50)

## 2013-05-14 LAB — HEPATIC FUNCTION PANEL
ALT: 24 U/L (ref 0–35)
AST: 20 U/L (ref 0–37)
Albumin: 4.2 g/dL (ref 3.5–5.2)
Alkaline Phosphatase: 55 U/L (ref 39–117)
Total Bilirubin: 0.6 mg/dL (ref 0.3–1.2)
Total Protein: 7.1 g/dL (ref 6.0–8.3)

## 2013-05-15 LAB — VITAMIN D 25 HYDROXY (VIT D DEFICIENCY, FRACTURES): Vit D, 25-Hydroxy: 51 ng/mL (ref 30–89)

## 2013-07-09 ENCOUNTER — Encounter: Payer: Self-pay | Admitting: Internal Medicine

## 2013-12-17 ENCOUNTER — Telehealth: Payer: Self-pay

## 2013-12-17 ENCOUNTER — Other Ambulatory Visit: Payer: Self-pay | Admitting: Internal Medicine

## 2013-12-17 MED ORDER — CYCLOBENZAPRINE HCL 5 MG PO TABS: ORAL_TABLET | ORAL | Status: DC

## 2013-12-17 NOTE — Telephone Encounter (Signed)
Patient would like a refill of cyclobenzaprine currently off list, but on historical list.  Advise on refill ([place].)

## 2013-12-17 NOTE — Telephone Encounter (Signed)
Cyclobenzaprine 5 mg script has been faxed to [facility]

## 2013-12-19 ENCOUNTER — Ambulatory Visit (INDEPENDENT_AMBULATORY_CARE_PROVIDER_SITE_OTHER): Payer: 59 | Admitting: Internal Medicine

## 2013-12-19 VITALS — BP 130/80 | HR 78 | Temp 98.5°F | Resp 16 | Ht 64.75 in | Wt 217.4 lb

## 2013-12-19 DIAGNOSIS — R0789 Other chest pain: Secondary | ICD-10-CM

## 2013-12-19 DIAGNOSIS — S29019A Strain of muscle and tendon of unspecified wall of thorax, initial encounter: Secondary | ICD-10-CM

## 2013-12-19 DIAGNOSIS — S239XXA Sprain of unspecified parts of thorax, initial encounter: Secondary | ICD-10-CM

## 2013-12-19 DIAGNOSIS — R071 Chest pain on breathing: Secondary | ICD-10-CM

## 2013-12-19 MED ORDER — TRAMADOL HCL 50 MG PO TABS: ORAL_TABLET | ORAL | Status: DC

## 2013-12-19 MED ORDER — MELOXICAM 15 MG PO TABS: 15.0000 mg | ORAL_TABLET | Freq: Every day | ORAL | Status: DC

## 2013-12-19 MED ORDER — TIZANIDINE HCL 6 MG PO CAPS: 6.0000 mg | ORAL_CAPSULE | Freq: Three times a day (TID) | ORAL | Status: DC

## 2013-12-19 NOTE — Progress Notes (Signed)
This chart was scribed for [employer]. Laney Pastor, MD by Marcha Dutton, ED Scribe. This patient was seen in room 10 and the patient's care was started at 8:46 AM.  Subjective:    Patient ID: [name], female    DOB: 1950/10/11, 63 y.o.   MRN: [id number]  HPI Chief Complaint  Patient presents with  . Shoulder Pain    L shoulder radiating around to L side of chest, pt. thinks she may have worked too hard in the yard on Wed. Pain started on Thurs.  . Chest Pain    Assoc. with L shoulder pain    HPI Comments: [name] is a 63 y.o. female who presents to the [facility] complaining of left shoulder pain radiating to lower left breast and back. Pt reports her lower back began hurting 4 days ago after working in the yard. She reports that her lower back pain has resolved but reports a constact ache in lower left breast radiating to back. She states she was doing some shoveling and hoeing in the garden and may have overdone it. Pt is right hand dominant. Pt reports rolling in bed hurts, opening car doors, and pain with inspiration. Pt denies any injury or popping sensation. She states the pain has been keeping her from sleeping and would like something to help her but is concerned it may interfere with her BP medication.   Patient Active Problem List   Diagnosis Date Noted  . Allergic rhinitis, cause unspecified   . 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 Osteopenia 10/17/2011  . Rheumatoid arthritis(714.0)   . Hypertension   . Obese      Review of Systems  Constitutional: Positive for activity change. Negative for fever and fatigue.  HENT: Negative for congestion.   Respiratory: Negative for cough, chest tightness and shortness of breath.   Gastrointestinal: Negative for nausea, vomiting and abdominal pain.  Musculoskeletal: Positive for arthralgias, back pain and myalgias.  Neurological: Positive for speech  difficulty.       Objective:   Physical Exam  Eyes: Pupils are equal, round, and reactive to light.  Neck: Normal range of motion. Neck supple.  Cardiovascular: Normal rate, regular rhythm and normal heart sounds.   No murmur heard. Pulmonary/Chest: Effort normal and breath sounds normal. No respiratory distress.  Musculoskeletal:  Pt has tenderness to palpation along the left scapular border and left trapezius, Left shoulder has a full ROM, mildly tender along sternum, full chest excursion without pain    Triage vitals: BP 130/80  Pulse 78  Temp(Src) 98.5 F (36.9 C) (Oral)  Resp 16  Ht 5' 4.75" (1.645 m)  Wt 217 lb 6.4 oz (98.612 kg)  BMI 36.44 kg/m2  SpO2 97%     Assessment & Plan:    Meds ordered this encounter  Medications  . meloxicam (MOBIC) 15 MG tablet    Sig: Take 1 tablet (15 mg total) by mouth daily.    Dispense:  15 tablet    Refill:  0  . tizanidine (ZANAFLEX) 6 MG capsule    Sig: Take 1 capsule (6 mg total) by mouth 3 (three) times daily.    Dispense:  14 capsule    Refill:  0  . traMADol (ULTRAM) 50 MG tablet    Sig: 1-2 hs prn pain    Dispense:  10 tablet    Refill:  0   1. Pain, chest wall  2. Thoracic myofascial strain      Pt advised of plan for treatment and pt agrees.I have completed the patient encounter in its entirety as documented by the scribe, with editing by me where necessary. Perle Gibbon P. Laney Pastor, M.D.

## 2013-12-23 ENCOUNTER — Encounter: Payer: Self-pay | Admitting: Internal Medicine

## 2013-12-23 ENCOUNTER — Ambulatory Visit (INDEPENDENT_AMBULATORY_CARE_PROVIDER_SITE_OTHER): Payer: 59 | Admitting: Internal Medicine

## 2013-12-23 VITALS — BP 110/80 | HR 72 | Temp 97.9°F | Resp 12 | Wt 213.8 lb

## 2013-12-23 DIAGNOSIS — B0229 Other postherpetic nervous system involvement: Secondary | ICD-10-CM

## 2013-12-23 MED ORDER — VALACYCLOVIR HCL 500 MG PO TABS: 500.0000 mg | ORAL_TABLET | Freq: Three times a day (TID) | ORAL | Status: DC

## 2013-12-23 MED ORDER — GABAPENTIN 100 MG PO CAPS: ORAL_CAPSULE | ORAL | Status: DC

## 2013-12-23 NOTE — Patient Instructions (Signed)
Assess response to the gabapentin one every 8 hours as needed. If it is partially beneficial, it can be increased up to a total of 3 pills every 8 hours as needed. This increase of 1 pill each dose  should take place over 36 hours at least.

## 2013-12-23 NOTE — Progress Notes (Signed)
   Subjective:    Patient ID: [name], female    DOB: June 01, 1951, 63 y.o.   MRN: [id number]  HPI  Her symptoms began 12/17/13  as pain in the mid thoracic spine the day after working in yard.  It is described as dull but constant, up to a level VII.  It will radiate from the spine anteriorly to the left breast as well as superiorly into the neck  Temporary relief was provided by ice. Muscle relaxants and nonsteroidals have not been of help.  She does fine that thoracic rotation or deep breathing aggravates the pain She also noted a rash on the posterior thorax  She has a past history of low back pain but no surgery or interventions therapeutically.    Review of Systems  She's had associated fatigue. She describes some chills.  There's been some dizziness and low-grade headache since the onset of the spine pain  She notes some tingling in her fingers.       Objective:   Physical Exam  She appears uncomfortable but in no acute distress  Has no lymphadenopathy about the neck or axilla  Chest is clear with no rubs or increased work of breathing  There is no pain with chest compression or compression of the thoracic spine  Heart rhythm is regular  She is able to lie flat and sit up without help. Straight leg raising is negative.  Strength, tone, deep tendon reflexes are normal.  Gait is normal; she has some difficulty doing heel walking gait due feeling unsteady  instability.  There is a papular rash in the posterior thorax in the T-4 dermatome        Assessment & Plan:  #1 T4 post herpetic neuralgia See orders

## 2013-12-23 NOTE — Progress Notes (Signed)
Pre visit review using our clinic review tool, if applicable. No additional management support is needed unless otherwise documented below in the visit note. 

## 2014-03-09 ENCOUNTER — Encounter: Payer: Self-pay | Admitting: Internal Medicine

## 2014-03-09 ENCOUNTER — Ambulatory Visit (INDEPENDENT_AMBULATORY_CARE_PROVIDER_SITE_OTHER): Payer: 59 | Admitting: Internal Medicine

## 2014-03-09 VITALS — BP 118/84 | HR 82 | Temp 98.2°F | Wt 216.4 lb

## 2014-03-09 DIAGNOSIS — J310 Chronic rhinitis: Secondary | ICD-10-CM

## 2014-03-09 DIAGNOSIS — H6993 Unspecified Eustachian tube disorder, bilateral: Secondary | ICD-10-CM

## 2014-03-09 DIAGNOSIS — H6121 Impacted cerumen, right ear: Secondary | ICD-10-CM

## 2014-03-09 DIAGNOSIS — H6120 Impacted cerumen, unspecified ear: Secondary | ICD-10-CM

## 2014-03-09 DIAGNOSIS — H6990 Unspecified Eustachian tube disorder, unspecified ear: Secondary | ICD-10-CM

## 2014-03-09 NOTE — Patient Instructions (Signed)
Plain Mucinex (NOT D) for thick secretions ;force NON dairy fluids .   Nasal cleansing in the shower as discussed with lather of mild shampoo.After 10 seconds wash off lather while  exhaling through nostrils. Make sure that all residual soap is removed to prevent irritation.  Flonase OR Nasacort AQ 1 spray in each nostril twice a day as needed. Use the "crossover" technique into opposite nostril spraying toward opposite ear @ 45 degree angle, not straight up into nostril.  Use a Neti pot daily only  as needed for significant sinus congestion; going from open side to congested side . Plain Allegra (NOT D )  160 daily , Loratidine 10 mg , OR Zyrtec 10 mg @ bedtime  as needed for itchy eyes & sneezing.  Please do not use Q-tips as we discussed. Should wax build up occur, please put 2-3 drops of mineral oil in the affected  ear at night to soften the wax .Cover the canal with a  cotton ball to prevent the oil from staining bed linens. In the morning fill the ear canal with hydrogen peroxide & lie in the opposite lateral decubitus position(on the side opposite the affected ear)  for 10-15 minutes. After allowing this period of time for the peroxide to dissolve the wax ;shower and use the thinnest washrag available to wick out the wax. If both ears are involved ; alternate this treatment from ear to ear each night until no wax is found on the washrag. 

## 2014-03-09 NOTE — Progress Notes (Signed)
   Subjective:    Patient ID: [name], female    DOB: [birth year], 63 y.o.   MRN: [id number]  HPI  A week ago she developed sore throat, rhinitis, and head congestion after working in yard. She's been using Mucinex with minimal response.  She's developed an earache worse on the right. She also has a dry cough. She describes mild dizziness with this complex.  She has a history of seasonal allergies.    Review of Systems  She specifically denies frontal headache, facial pain, nasal purulence, dental pain, or purulent sputum.  She has no associated shortness of breath or wheezing.  Chest no fever, chills, or sweats.     Objective:   Physical Exam  Upper partial is present  She has a significant cerumen impaction on the right. She hears a whisper at 6 feet bilaterally.  The remainder of the exam is unremarkable.  General appearance:good health ;well nourished; no acute distress or increased work of breathing is present.  No  lymphadenopathy about the head, neck, or axilla noted.   Eyes: No conjunctival inflammation or lid edema is present. There is no scleral icterus.  Ears:  External ear exam shows no significant lesions or deformities.   Nose:  External nasal examination shows no deformity or inflammation. Nasal mucosa are pink and moist without lesions or exudates. No septal dislocation or deviation.No obstruction to airflow.   Oral exam: Dental hygiene is good; lips and gums are healthy appearing.There is no oropharyngeal erythema or exudate noted.   Neck:  No deformities, thyromegaly, masses, or tenderness noted.   Supple with full range of motion without pain.   Heart:  Normal rate and regular rhythm. S1 and S2 normal without gallop, murmur, click, rub or other extra sounds.   Lungs:Chest clear to auscultation; no wheezes, rhonchi,rales ,or rubs present.No increased work of breathing.    Extremities:  No cyanosis, edema, or clubbing  noted    Skin: Warm &  dry          Assessment & Plan:  #1 nonallergic rhinitis  #2 cerumen impaction  See after visit summary

## 2014-03-09 NOTE — Progress Notes (Signed)
Pre visit review using our clinic review tool, if applicable. No additional management support is needed unless otherwise documented below in the visit note. 

## 2014-04-02 ENCOUNTER — Other Ambulatory Visit: Payer: Self-pay | Admitting: Internal Medicine

## 2014-04-29 ENCOUNTER — Ambulatory Visit (INDEPENDENT_AMBULATORY_CARE_PROVIDER_SITE_OTHER): Payer: 59 | Admitting: Internal Medicine

## 2014-04-29 ENCOUNTER — Other Ambulatory Visit (INDEPENDENT_AMBULATORY_CARE_PROVIDER_SITE_OTHER): Payer: 59

## 2014-04-29 ENCOUNTER — Encounter: Payer: Self-pay | Admitting: Internal Medicine

## 2014-04-29 VITALS — BP 134/80 | HR 66 | Temp 98.3°F | Wt 218.4 lb

## 2014-04-29 DIAGNOSIS — R109 Unspecified abdominal pain: Secondary | ICD-10-CM | POA: Insufficient documentation

## 2014-04-29 DIAGNOSIS — R112 Nausea with vomiting, unspecified: Secondary | ICD-10-CM

## 2014-04-29 DIAGNOSIS — R1112 Projectile vomiting: Secondary | ICD-10-CM

## 2014-04-29 DIAGNOSIS — Z0000 Encounter for general adult medical examination without abnormal findings: Secondary | ICD-10-CM

## 2014-04-29 DIAGNOSIS — K921 Melena: Secondary | ICD-10-CM

## 2014-04-29 LAB — URINALYSIS, ROUTINE W REFLEX MICROSCOPIC
BILIRUBIN URINE: NEGATIVE
HGB URINE DIPSTICK: NEGATIVE
Ketones, ur: NEGATIVE
LEUKOCYTES UA: NEGATIVE
NITRITE: NEGATIVE
RBC / HPF: NONE SEEN (ref 0–?)
Specific Gravity, Urine: 1.01 (ref 1.000–1.030)
Total Protein, Urine: NEGATIVE
Urine Glucose: NEGATIVE
Urobilinogen, UA: 0.2 (ref 0.0–1.0)
pH: 5.5 (ref 5.0–8.0)

## 2014-04-29 NOTE — Patient Instructions (Signed)
Please continue all other medications as before, and refills have been done if requested.  Please have the pharmacy call with any other refills you may need.  Please continue your efforts at being more active, low cholesterol diet, and weight control.  You are otherwise up to date with prevention measures today.  Please keep your appointments with your specialists as you may have planned  Please go to the LAB in the Basement (turn left off the elevator) for the tests to be done today - just the urine test today  You will be contacted by phone if any changes need to be made immediately.  Otherwise, you will receive a letter about your results with an explanation, but please check with MyChart first.  Please remember to sign up for MyChart if you have not done so, as this will be important to you in the future with finding out test results, communicating by private email, and scheduling acute appointments online when needed.  You will be contacted regarding the referral for: colonoscopy

## 2014-04-29 NOTE — Progress Notes (Signed)
Pre visit review using our clinic review tool, if applicable. No additional management support is needed unless otherwise documented below in the visit note. 

## 2014-04-29 NOTE — Progress Notes (Signed)
Subjective:    Patient ID: [name], female    DOB: May 31, 1951, 63 y.o.   MRN: [id number]  HPI  Here to f/u,  C/o onset feeling bad, bloating after eating dinner wed sept 9, then several vomiting that evening, no one else sick but her, no fever, felt sluggish tired yesterday, with achy soreness mild worse with moving about/twisting to the left abd/side. Had some recent constipation but not really a problem in the past 2 days, o/w no further n/v.  Stil has GB.  Years ago has hx of abd pain wtihout explanation except for "twisted colon" that o/w did not require tx.  Takes prn phazyme otc for bloating. Past Medical History  Diagnosis Date  . Hypertension   . Rheumatoid arthritis(714.0) dx 2010  . Pseudogout of foot   . GERD (gastroesophageal reflux disease)   . Allergy    Past Surgical History  Procedure Laterality Date  . Breast biopsy  1972  . Broken wrist  2010  . Pneumonia  2007  . Inner ear surgery      busted ear drum  . Cataract extraction  03/2012    left  . Cosmetic surgery    . Fracture surgery      reports that she has quit smoking. Her smoking use included Cigarettes. She smoked 0.00 packs per day. She quit smokeless tobacco use about 32 years ago. She reports that she does not drink alcohol or use illicit drugs. family history includes Heart attack in her father; Hypertension in her brother, father, and sister; Hyperthyroidism in her sister; Hypothyroidism in her brother; Stroke in her mother. No Known Allergies Current Outpatient Prescriptions on File Prior to Visit  Medication Sig Dispense Refill  . acetaminophen (TYLENOL) 325 MG tablet Take 325 mg by mouth every 6 (six) hours as needed.      . ALPRAZolam (XANAX) 0.5 MG tablet TAKE ONE TABLET BY MOUTH EVERY DAY AS NEEDED FOR ANXIETY  30 tablet  0  . aspirin 81 MG tablet Take 81 mg by mouth daily.      . Biotin (BIOTIN 5000) 5 MG CAPS Take by mouth daily.      . Calcium-Magnesium-Vitamin D [id number] MG-MG-UNIT  TB24 Take 4 tablets by mouth daily.      Marland Kitchen gabapentin (NEURONTIN) 100 MG capsule One pill every eight hours as needed; dose may be increased by one pill each dose after 48 hours if only partially effective  30 capsule  2  . guaiFENesin (MUCINEX) 600 MG 12 hr tablet Take 1,200 mg by mouth 2 (two) times daily as needed for congestion.      . hydroxychloroquine (PLAQUENIL) 200 MG tablet Take by mouth 2 (two) times daily.      . Loratadine 10 MG CAPS Take 10 mg by mouth.      . losartan-hydrochlorothiazide (HYZAAR) 100-25 MG per tablet TAKE ONE TABLET BY MOUTH ONCE DAILY  90 tablet  0  . metoprolol succinate (TOPROL-XL) 50 MG 24 hr tablet TAKE TWO TABLETS BY MOUTH ONCE DAILY.  60 tablet  11  . ranitidine (ZANTAC) 150 MG tablet Take 150 mg by mouth daily as needed.      . sulfaSALAzine (AZULFIDINE) 500 MG tablet Take 1,000 mg by mouth 2 (two) times daily.      . cholecalciferol (VITAMIN D) 1000 UNITS tablet Take 1 tablet (1,000 Units total) by mouth daily.       No current facility-administered medications on file prior to visit.  Review of Systems  Constitutional: Negative for unusual diaphoresis or other sweats  HENT: Negative for ringing in ear Eyes: Negative for double vision or worsening visual disturbance.  Respiratory: Negative for choking and stridor.   Gastrointestinal: Negative for vomiting or other signifcant bowel change Genitourinary: Negative for hematuria or decreased urine volume.  Musculoskeletal: Negative for other MSK pain or swelling Skin: Negative for color change and worsening wound.  Neurological: Negative for tremors and numbness other than noted  Psychiatric/Behavioral: Negative for decreased concentration or agitation other than above       Objective:   Physical Exam BP 134/80  Pulse 66  Temp(Src) 98.3 F (36.8 C) (Oral)  Wt 218 lb 6 oz (99.054 kg)  SpO2 96% VS noted,  Constitutional: Pt appears well-developed, well-nourished.  HENT: Head: NCAT.  Right Ear:  External ear normal.  Left Ear: External ear normal.  Eyes: . Pupils are equal, round, and reactive to light. Conjunctivae and EOM are normal Neck: Normal range of motion. Neck supple.  Cardiovascular: Normal rate and regular rhythm.   Pulmonary/Chest: Effort normal and breath sounds normal.  Abd:  Soft, NT, ND, + BS, mild tender noted left lateral side/chest wall at t8 , ant axillary line Neurological: Pt is alert. Not confused , motor grossly intact Skin: Skin is warm. No rash Psychiatric: Pt behavior is normal. No agitation.     Assessment & Plan:

## 2014-04-30 NOTE — Assessment & Plan Note (Signed)
C/w msk, for tylenol prn, to f/u any worsening symptoms or concerns 

## 2014-04-30 NOTE — Assessment & Plan Note (Signed)
Incidental hx, ongoing intermittent many years, has always deferred colonoscopy in past, now ok for exam, will order

## 2014-04-30 NOTE — Assessment & Plan Note (Signed)
Single evening mult episodes projectile, now resolved, no obviuos cause, symptoms resolved, exam benign,  to f/u any worsening symptoms or concerns

## 2014-05-05 ENCOUNTER — Encounter: Payer: Self-pay | Admitting: Internal Medicine

## 2014-05-12 ENCOUNTER — Other Ambulatory Visit: Payer: Self-pay | Admitting: Internal Medicine

## 2014-06-01 ENCOUNTER — Other Ambulatory Visit: Payer: Self-pay | Admitting: Internal Medicine

## 2014-06-02 ENCOUNTER — Encounter: Payer: Self-pay | Admitting: Internal Medicine

## 2014-06-02 ENCOUNTER — Ambulatory Visit (INDEPENDENT_AMBULATORY_CARE_PROVIDER_SITE_OTHER): Payer: 59 | Admitting: Internal Medicine

## 2014-06-02 VITALS — BP 130/78 | HR 64 | Temp 98.7°F | Wt 220.4 lb

## 2014-06-02 DIAGNOSIS — E785 Hyperlipidemia, unspecified: Secondary | ICD-10-CM

## 2014-06-02 DIAGNOSIS — F411 Generalized anxiety disorder: Secondary | ICD-10-CM

## 2014-06-02 DIAGNOSIS — I10 Essential (primary) hypertension: Secondary | ICD-10-CM

## 2014-06-02 DIAGNOSIS — M069 Rheumatoid arthritis, unspecified: Secondary | ICD-10-CM

## 2014-06-02 DIAGNOSIS — R739 Hyperglycemia, unspecified: Secondary | ICD-10-CM

## 2014-06-02 MED ORDER — METOPROLOL SUCCINATE ER 50 MG PO TB24: ORAL_TABLET | ORAL | Status: DC

## 2014-06-02 MED ORDER — ALPRAZOLAM 0.5 MG PO TABS: ORAL_TABLET | ORAL | Status: DC

## 2014-06-02 MED ORDER — LOSARTAN POTASSIUM-HCTZ 100-25 MG PO TABS: ORAL_TABLET | ORAL | Status: DC

## 2014-06-02 NOTE — Assessment & Plan Note (Signed)
A1c

## 2014-06-02 NOTE — Progress Notes (Signed)
Pre visit review using our clinic review tool, if applicable. No additional management support is needed unless otherwise documented below in the visit note. 

## 2014-06-02 NOTE — Assessment & Plan Note (Addendum)
Blood pressure goals reviewed. BMET WNL 05/23/14

## 2014-06-02 NOTE — Patient Instructions (Addendum)
Plain Mucinex (NOT D) for thick secretions ;force NON dairy fluids .   Nasal cleansing in the shower as discussed with lather of mild shampoo.After 10 seconds wash off lather while  exhaling through nostrils. Make sure that all residual soap is removed to prevent irritation.  Flonase OR Nasacort AQ 1 spray in each nostril twice a day as needed. Use the "crossover" technique into opposite nostril spraying toward opposite ear @ 45 degree angle, not straight up into nostril.  Use a Neti pot daily only  as needed for significant sinus congestion; going from open side to congested side . Plain Allegra (NOT D )  160 daily , Loratidine 10 mg , OR Zyrtec 10 mg @ bedtime  as needed for itchy eyes & sneezing.   Minimal Blood Pressure Goal= AVERAGE < 140/90;  Ideal is an AVERAGE < 135/85. This AVERAGE should be calculated from @ least 5-7 BP readings taken @ different times of day on different days of week. You should not respond to isolated BP readings , but rather the AVERAGE for that week .Please bring your  blood pressure cuff to office visits to verify that it is reliable.It  can also be checked against the blood pressure device at the pharmacy. Finger or wrist cuffs are not dependable; an arm cuff is.

## 2014-06-02 NOTE — Progress Notes (Signed)
   Subjective:    Patient ID: [name], female    DOB: [birth year], 63 y.o.   MRN: [id number]  HPI She is here to assess status of active health conditions:  Diet/ nutrition:modified heart healthy Exercise program:none due to RA  HYPERTENSION: Disease Monitoring:no monitor except @ pharmacy 130/70 or < Medication Compliance:yes Adverse effects: none  FASTING HYPERGLYCEMIA :  FBS range/average:no monitor FH DM:no Ophthamology care:UTD   HYPERLIPIDEMIA: FH MI /CVA:Father MI @ 30 Medication Compliance:no statin  Hypercalcemia: No vitamin D or Ca++ supplementation.Ca++ 10.5 on 01/17/13 Also "normal" 05/23/14   Recent flare of perennial rhinitis symptoms.     Review of Systems  Chest pain, palpitations:no       Dyspnea:no Edema:persistent hand & foot edema Claudication: no Lightheadedness,Syncope:no Weight gain/loss:no Polyuria/phagia/dipsia:  no    Blurred vision /diplopia/lossof vision:no Limb numbness/tingling/burning:no Non healing skin lesions:no Abd pain, bowel changes: no; colonoscopy next month  Myalgias: no Memory loss:no        Objective:   Physical Exam  Gen.:  well-nourished in appearance. Alert, appropriate and cooperative throughout exam. As per CDC Guidelines ,Epic documents obesity as being present . Appears younger than stated age  Head: Normocephalic without obvious abnormalities  Eyes: No corneal or conjunctival inflammation noted. Pupils equal round reactive to light and accommodation. Extraocular motion intact.  Ears: External  ear exam reveals no significant lesions or deformities. Canals clear .TMs normal. Hearing is grossly normal bilaterally. Nose: External nasal exam reveals no deformity or inflammation. Nasal mucosa are pink and moist. No lesions or exudates noted.   Mouth: Oral mucosa and oropharynx reveal no lesions or exudates. Teeth in good repair. Neck: No deformities, masses, or tenderness noted.  Thyroid normal Lungs:  Normal respiratory effort; chest expands symmetrically. Lungs are clear to auscultation without rales, wheezes, or increased work of breathing. Heart: Normal rate and rhythm. Normal S1 and S2. No gallop, click, or rub. No murmur. Abdomen: Bowel sounds normal; abdomen soft and nontender. No masses, organomegaly or hernias noted.                          Musculoskeletal/extremities: No deformity or scoliosis noted of  the thoracic or lumbar spine. . Crepitus of knees  No clubbing, cyanosis, edema, or significant extremity  deformity noted. Range of motion normal .Tone & strength normal. Hand joints normal Fingernail health good. Able to lie down & sit up w/o help. Negative SLR bilaterally Vascular: Carotid, radial artery, dorsalis pedis and  posterior tibial pulses are full and equal. No bruits present. Neurologic: Alert and oriented x3.  Gait normal      Skin: Intact without suspicious lesions or rashes. Lymph: No cervical, axillary lymphadenopathy present. Psych: Mood and affect are normal. Normally interactive                                                                                      Assessment & Plan:  See Current Assessment & Plan in Problem List under specific Diagnosis

## 2014-06-02 NOTE — Assessment & Plan Note (Signed)
Dr Deveshwar 

## 2014-06-02 NOTE — Assessment & Plan Note (Signed)
NMR Lipoprofile  

## 2014-06-02 NOTE — Assessment & Plan Note (Addendum)
Calcium "normal" 05/23/2014 @ Dr Arlean Hopping office

## 2014-06-03 ENCOUNTER — Telehealth: Payer: Self-pay | Admitting: Internal Medicine

## 2014-06-03 NOTE — Telephone Encounter (Signed)
emmi emailed °

## 2014-06-21 ENCOUNTER — Other Ambulatory Visit (INDEPENDENT_AMBULATORY_CARE_PROVIDER_SITE_OTHER): Payer: 59

## 2014-06-21 ENCOUNTER — Other Ambulatory Visit: Payer: Self-pay | Admitting: Internal Medicine

## 2014-06-21 DIAGNOSIS — R739 Hyperglycemia, unspecified: Secondary | ICD-10-CM

## 2014-06-21 DIAGNOSIS — E785 Hyperlipidemia, unspecified: Secondary | ICD-10-CM

## 2014-06-21 LAB — HEMOGLOBIN A1C: Hgb A1c MFr Bld: 5.1 % (ref 4.6–6.5)

## 2014-06-21 LAB — TSH: TSH: 0.87 u[IU]/mL (ref 0.35–4.50)

## 2014-06-23 LAB — NMR LIPOPROFILE WITH LIPIDS
Cholesterol, Total: 225 mg/dL — ABNORMAL HIGH (ref 100–199)
HDL Particle Number: 25.8 umol/L — ABNORMAL LOW (ref >=30.5)
HDL Size: 9.3 nm (ref >=9.2)
HDL-C: 48 mg/dL (ref >39)
LARGE VLDL-P: 7.9 nmol/L — AB (ref <=2.7)
LDL (calc): 143 mg/dL — ABNORMAL HIGH (ref 0–99)
LDL PARTICLE NUMBER: 1754 nmol/L — AB (ref <1000)
LDL Size: 21.1 nm (ref >=20.8)
LP-IR Score: 49 — ABNORMAL HIGH (ref <=45)
Large HDL-P: 6.6 umol/L (ref >=4.8)
Small LDL Particle Number: 792 nmol/L — ABNORMAL HIGH (ref <=527)
TRIGLYCERIDES: 169 mg/dL — AB (ref 0–149)
VLDL SIZE: 47 nm — AB (ref <=46.6)

## 2014-07-05 ENCOUNTER — Ambulatory Visit (INDEPENDENT_AMBULATORY_CARE_PROVIDER_SITE_OTHER): Payer: 59 | Admitting: Internal Medicine

## 2014-07-05 ENCOUNTER — Encounter: Payer: Self-pay | Admitting: Internal Medicine

## 2014-07-05 VITALS — BP 132/86 | HR 72 | Ht 64.0 in | Wt 221.8 lb

## 2014-07-05 DIAGNOSIS — Z1211 Encounter for screening for malignant neoplasm of colon: Secondary | ICD-10-CM

## 2014-07-05 DIAGNOSIS — K649 Unspecified hemorrhoids: Secondary | ICD-10-CM

## 2014-07-05 NOTE — Progress Notes (Signed)
   Subjective:    Patient ID: [name], female    DOB: 1950-10-30, 63 y.o.   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 It is better since she has treated her constipation with herbal laxative supplement. It's very rare and only bright red blood with wiping at times and she can have symptoms of prolapsing hemorrhoids at times and she has been diagnosed with hemorrhoids. She has been reluctant to have a screening colonoscopy but is willing to do so now. Her GI review of systems is otherwise negative at this point. Medications, allergies, past medical history, past surgical history, family history and social history are reviewed and updated in the EMR.   Review of Systems Positive for joint pains, she has both rheumatoid and osteoarthritis back pain allergies. All other review of systems are negative.    Objective:   Physical Exam General:  Well-developed, well-nourished and in no acute distress Eyes:  anicteric. Lungs: Clear to auscultation bilaterally. Heart:  S1S2, no rubs, murmurs, gallops. Abdomen:  soft, non-tender, no hepatosplenomegaly, hernia, or mass and BS+.  Rectal: deferred Lymph:  no cervical or supraclavicular adenopathy. Extremities:   Trace edema, erythematous skin in feet Skin   no rash. Neuro:  A&O x 3.  Psych:  appropriate mood and  Affect.   Data Reviewed:        Assessment & Plan:  Colon cancer screening  Bleeding hemorrhoids  1. Screening colonoscopy discussed and she agrees to schedule. The risks and benefits as well as alternatives of endoscopic procedure(s) have been discussed and reviewed. All questions answered. The patient agrees to proceed. 2. Evaluate hemorrhoids at colonoscopy - briefly discussed possible ligation though since using stool softener/herb supplement much less constipation and bleeding   I appreciate the opportunity to care for  this patient.  EX:[id number] Asa Lente, MD

## 2014-07-05 NOTE — Patient Instructions (Signed)
You have been scheduled for a colonoscopy. Please follow written instructions given to you at your visit today.  Please pick up your prep supplies at the pharamcy. If you use inhalers (even only as needed), please bring them with you on the day of your procedure. Your physician has requested that you go to [email or web address] and enter the access code given to you at your visit today. This web site gives a general overview about your procedure. However, you should still follow specific instructions given to you by our office regarding your preparation for the procedure.   I appreciate the opportunity to care for you.

## 2014-07-06 ENCOUNTER — Encounter: Payer: Self-pay | Admitting: Family Medicine

## 2014-07-06 ENCOUNTER — Ambulatory Visit (INDEPENDENT_AMBULATORY_CARE_PROVIDER_SITE_OTHER): Payer: 59 | Admitting: Family Medicine

## 2014-07-06 VITALS — BP 114/70 | HR 72 | Ht 64.0 in | Wt 218.0 lb

## 2014-07-06 DIAGNOSIS — M79672 Pain in left foot: Secondary | ICD-10-CM

## 2014-07-06 DIAGNOSIS — M79671 Pain in right foot: Secondary | ICD-10-CM

## 2014-07-06 DIAGNOSIS — G609 Hereditary and idiopathic neuropathy, unspecified: Secondary | ICD-10-CM | POA: Insufficient documentation

## 2014-07-06 DIAGNOSIS — G629 Polyneuropathy, unspecified: Secondary | ICD-10-CM

## 2014-07-06 MED ORDER — HYDROCORTISONE 2.5 % EX OINT: TOPICAL_OINTMENT | CUTANEOUS | Status: DC

## 2014-07-06 NOTE — Patient Instructions (Signed)
Good to see you Try a sandal in the house with a back. Bronwen Betters Try hydrocortisone to feet 2 times daily.  Try new lotion to skin, Eucerin, Aveeno, etc but look for rich.  Nothing with alcohol or fragrance in it.  B12 1087mcg daily B6 200mg  daily.  Continue all other medications at this time Try to some of the exercises.  See me again in 4 weeks.

## 2014-07-06 NOTE — Assessment & Plan Note (Signed)
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 We discussed the possibility of increasing the we did decided not to at this time. I do think that some of patient's significant discomfort in her foot and warm feeling is more secondary to an allergic reaction. We discussed changing either the cleaning supplies or wearing shoes while she is in-house. We discussed different over-the-counter medicines as well the can be beneficial including B12 and B6 supple mentation. Patient will try these interventions and start home exercises as well to see if we can increase feeling in her feet and come back again in 4 weeks for further evaluation.

## 2014-07-06 NOTE — Progress Notes (Signed)
  [facility] [address] [place], [place] [address] Phone: [phone] Subjective:     CC: neuropathy.     RJJ:[id number] [name] is a 63 y.o. female coming in with complaint of neuropathy. Feet bilaterally.  Patient states she has had this for quite some time. Patient does not remember any true injury. Patient does have a past medical history significant for rheumatoid arthritis and has been diagnosed with osteoporosis.  Patient states that her foot pain seems to be worse. Patient has been diagnosed with neuropathy. Patient was started on gabapentin 100 mg 3 times a day with minimal benefit. Patient does walk around barefoot significant amount of time. Patient states that the plantar aspect of feet can get extremely warm if she tries to put him in shoes or walks for a long amount of time. Patient states some of her toes can be numb from time to time as well. This can be associated with swelling as well. Does not notice any significant change with home exercises, medications or food. Patient denies any significant injuries to the feet. Patient rates the severity of pain is 8 out of 10. Patient has started some over-the-counter medications with minimal benefit so far.     Past medical history, social, surgical and family history all reviewed in electronic medical record.   Review of Systems: No headache, visual changes, nausea, vomiting, diarrhea, constipation, dizziness, abdominal pain, skin rash, fevers, chills, night sweats, weight loss, swollen lymph nodes, body aches, joint swelling, muscle aches, chest pain, shortness of breath, mood changes.   Objective Blood pressure 114/70, pulse 72, height 5\' 4"  (1.626 m), weight 218 lb (98.884 kg), SpO2 96 %.  General: No apparent distress alert and oriented x3 mood and affect normal, dressed appropriately.  HEENT: Pupils equal, extraocular movements intact  Respiratory: Patient's speak in full sentences and does  not appear short of breath  Cardiovascular: No lower extremity edema, non tender, no erythema  Skin: Warm dry intact with no signs of infection or rash on extremities or on axial skeleton.  Abdomen: Soft nontender  Neuro: Cranial nerves II through XII are intact, neurovascularly intact in all extremities with 2+ DTRs and 2+ pulses.  Lymph: No lymphadenopathy of posterior or anterior cervical chain or axillae bilaterally.  Gait normal with good balance and coordination.  MSK:  Non tender with full range of motion and good stability and symmetric strength and tone of shoulders, elbows, wrist, hip, knee and ankles bilaterally.  Foot exam shows the patient does have mild breakdown of the longitudinal and transverse arch bilaterally. Patient's left large toe does have callus formation as well as a bunion formation with mild bunionette on the fifth toe. Patient's plantar aspect of the feet though does have significant erythema and appears to be more of a contact dermatitis. She is neurovascularly intact distally. Patient does have some mild hallux rigidus bilaterally left greater than right.    Impression and Recommendations:     This case required medical decision making of moderate complexity.

## 2014-07-06 NOTE — Assessment & Plan Note (Signed)
I believe the patient's foot pain bilaterally is likely multifactorial. Patient's history of diabetes as well as rheumatoid arthritis anything that there is some peripheral neuropathy. In addition this note patient does have significant erythema of the plantar aspect of the foot as well as significant dry skin that could be secondary to a contact dermatitis. We'll try hydrocortisone cream. We discussed proper shoe wear. We discussed over-the-counter medicines the can help with also osteoarthritis that could be contributing. We discussed about different supplementations over-the-counter that can help with the neuropathy symptoms. Patient will try these interventions and come back and see me again in 4 weeks for further evaluation.

## 2014-08-02 ENCOUNTER — Ambulatory Visit: Payer: 59 | Admitting: Family Medicine

## 2014-08-02 ENCOUNTER — Telehealth: Payer: Self-pay | Admitting: Internal Medicine

## 2014-08-02 NOTE — Telephone Encounter (Signed)
Pt reflects NO SHOW status for todays scheduled follow up appt. Please advise.

## 2014-08-03 NOTE — Telephone Encounter (Signed)
No show please, noted.

## 2014-08-22 ENCOUNTER — Encounter: Payer: Self-pay | Admitting: Family Medicine

## 2014-08-22 ENCOUNTER — Ambulatory Visit (INDEPENDENT_AMBULATORY_CARE_PROVIDER_SITE_OTHER): Payer: 59 | Admitting: Family Medicine

## 2014-08-22 VITALS — BP 140/84 | HR 74 | Ht 64.0 in

## 2014-08-22 DIAGNOSIS — M79671 Pain in right foot: Secondary | ICD-10-CM

## 2014-08-22 DIAGNOSIS — M79672 Pain in left foot: Secondary | ICD-10-CM

## 2014-08-22 NOTE — Progress Notes (Signed)
  [facility] [address] [place], [place] [address] Phone: [phone] Subjective:     CC: neuropathy follow up  EHO:[id number] MAYANA IRIGOYEN is a 64 y.o. female coming in with complaint of neuropathy. Feet bilaterally.  Patient states she has had this for quite some time. Patient does not remember any true injury. Patient does have a past medical history significant for rheumatoid arthritis and has been diagnosed with osteoporosis.  Patient states that her foot pain seems to be worse. Patient has been diagnosed with neuropathy. Patient was started on gabapentin 100 mg 3 times a day with minimal benefit. Patient does walk around barefoot significant amount of time.  Patient was told to do and icing protocol, we discussed over-the-counter medications, and we discussed home exercises. Patient states that she has had a 70% improvement in her feet. Still has some mild discomfort. Patient has changed her tennis shoes and this has been helpful. Patient states that still has a burning sensation in the end of a long day. Not completely pain-free but better.     Past medical history, social, surgical and family history all reviewed in electronic medical record.   Review of Systems: No headache, visual changes, nausea, vomiting, diarrhea, constipation, dizziness, abdominal pain, skin rash, fevers, chills, night sweats, weight loss, swollen lymph nodes, body aches, joint swelling, muscle aches, chest pain, shortness of breath, mood changes.   Objective Blood pressure 140/84, pulse 74, height 5\' 4"  (1.626 m), SpO2 97 %.  General: No apparent distress alert and oriented x3 mood and affect normal, dressed appropriately.  HEENT: Pupils equal, extraocular movements intact  Respiratory: Patient's speak in full sentences and does not appear short of breath  Cardiovascular: No lower extremity edema, non tender, no erythema  Skin: Warm dry intact with no signs of infection or  rash on extremities or on axial skeleton.  Abdomen: Soft nontender  Neuro: Cranial nerves II through XII are intact, neurovascularly intact in all extremities with 2+ DTRs and 2+ pulses.  Lymph: No lymphadenopathy of posterior or anterior cervical chain or axillae bilaterally.  Gait normal with good balance and coordination.  MSK:  Non tender with full range of motion and good stability and symmetric strength and tone of shoulders, elbows, wrist, hip, knee and ankles bilaterally.  Foot exam shows the patient does have mild breakdown of the longitudinal and transverse arch bilaterally. Patient's left large toe does have callus formation as well as a bunion formation with mild bunionette on the fifth toe. Patient's plantar aspect of the feet redness of feet is significantly better from previous exam. She is neurovascularly intact distally. Patient does have some mild hallux rigidus bilaterally left greater than right. No significant change from previous exam.    Impression and Recommendations:     This case required medical decision making of moderate complexity.

## 2014-08-22 NOTE — Assessment & Plan Note (Signed)
Patient is doing relatively well with conservative therapy. With patient's rheumatoid arthritis and CPPD and hypercalcemia this all began be contributing. Discussed with patient continue to wear good shoes and will try over-the-counter orthotics. We discussed continuing the over-the-counter medications. We discussed the icing protocol and how this can be beneficial. Patient will try to make these changes and come back and see me. Continued have pain she may be a candidate for custom orthotics. We will discuss this again in follow-up in 3 weeks.

## 2014-08-22 NOTE — Patient Instructions (Signed)
So good to see you You are dong great!!!! Spenco orthotic online look for "total support" Continue the ice and the natural medicines  Don't worry vitamin D going up.  If not much better in 3-4 weeks then we can do custom orthotics.  Please make  30 minute appointment.

## 2014-09-07 ENCOUNTER — Telehealth: Payer: Self-pay | Admitting: Internal Medicine

## 2014-09-07 NOTE — Telephone Encounter (Signed)
no

## 2014-09-09 ENCOUNTER — Encounter: Payer: 59 | Admitting: Internal Medicine

## 2014-09-15 ENCOUNTER — Ambulatory Visit (INDEPENDENT_AMBULATORY_CARE_PROVIDER_SITE_OTHER): Payer: 59

## 2014-09-15 DIAGNOSIS — Z23 Encounter for immunization: Secondary | ICD-10-CM

## 2014-10-28 ENCOUNTER — Ambulatory Visit (AMBULATORY_SURGERY_CENTER): Payer: 59 | Admitting: Internal Medicine

## 2014-10-28 ENCOUNTER — Encounter: Payer: Self-pay | Admitting: Internal Medicine

## 2014-10-28 VITALS — BP 149/71 | HR 52 | Temp 97.1°F | Resp 18 | Ht 64.0 in | Wt 221.0 lb

## 2014-10-28 DIAGNOSIS — D123 Benign neoplasm of transverse colon: Secondary | ICD-10-CM

## 2014-10-28 DIAGNOSIS — Z1211 Encounter for screening for malignant neoplasm of colon: Secondary | ICD-10-CM

## 2014-10-28 MED ORDER — SODIUM CHLORIDE 0.9 % IV SOLN: 500.0000 mL | INTRAVENOUS | Status: DC

## 2014-10-28 NOTE — Progress Notes (Signed)
Report to PACU, RN, vss, BBS= Clear.  

## 2014-10-28 NOTE — Op Note (Signed)
[place]  [address] [place], [id number]   COLONOSCOPY PROCEDURE REPORT  PATIENT: Christine, [name]  MR#: [id number] BIRTHDATE: 09/11/1950 , 14  yrs. old GENDER: female ENDOSCOPIST: Gatha Mayer, MD, [facility] PROCEDURE DATE:  10/28/2014 PROCEDURE:   Colonoscopy, screening and Colonoscopy with snare polypectomy First Screening Colonoscopy - Avg.  risk and is 50 yrs.  old or older Yes.  Prior Negative Screening - Now for repeat screening. N/A  History of Adenoma - Now for follow-up colonoscopy & has been > or = to 3 yrs.  N/A ASA CLASS:   Class II INDICATIONS:Screening for colonic neoplasia and Colorectal Neoplasm Risk Assessment for this procedure is average risk. MEDICATIONS: Propofol 300 mg IV and Monitored anesthesia care  DESCRIPTION OF PROCEDURE:   After the risks benefits and alternatives of the procedure were thoroughly explained, informed consent was obtained.  The digital rectal exam revealed no abnormalities of the rectum.   The [id number]  endoscope was introduced through the anus and advanced to the cecum, which was identified by both the appendix and ileocecal valve. No adverse events experienced.   The quality of the prep was excellent. (MiraLax was used)  The instrument was then slowly withdrawn as the colon was fully examined.      COLON FINDINGS: A sessile polyp measuring 12 mm in size was found in the transverse colon.  A polypectomy was performed in a piecemeal fashion with a cold snare.  The resection was complete, the polyp tissue was completely retrieved and sent to histology.   Melanosis coli was found throughout the entire examined colon.   The examination was otherwise normal.  Retroflexed views revealed no abnormalities. The time to cecum = 7.0 Withdrawal time = 15.2   The scope was withdrawn and the procedure completed. COMPLICATIONS: There were no immediate complications.  ENDOSCOPIC IMPRESSION: 1.   Sessile polyp  was found in the transverse colon; polypectomy was performed in a piecemeal fashion with a cold snare 2.   Melanosis coli was found throughout the entire examined colon 3.   The examination was otherwise normal  RECOMMENDATIONS: 1.  Timing of repeat colonoscopy will be determined by pathology findings. 2.  Hemorrhoids not seen today but has sxs consistent with these.  eSigned:  Gatha Mayer, MD, [facility] 10/28/2014 2:54 PM   cc: Dr. Asa Lente and The Patient

## 2014-10-28 NOTE — Patient Instructions (Addendum)
I found and removed one polyp that looks benign but could mean coming back in 3-5 years instead of 10. You have melanosis coli also - discoloration of the colon lining from certain laxatives, It is not a problem.  I will let you know pathology results and when to have another routine colonoscopy by mail.  I appreciate the opportunity to care for you. Gatha Mayer, MD, FACG  YOU HAD AN ENDOSCOPIC PROCEDURE TODAY AT [facility]:   Refer to the procedure report that was given to you for any specific questions about what was found during the examination.  If the procedure report does not answer your questions, please call your gastroenterologist to clarify.  If you requested that your care partner not be given the details of your procedure findings, then the procedure report has been included in a sealed envelope for you to review at your convenience later.  YOU SHOULD EXPECT: Some feelings of bloating in the abdomen. Passage of more gas than usual.  Walking can help get rid of the air that was put into your GI tract during the procedure and reduce the bloating. If you had a lower endoscopy (such as a colonoscopy or flexible sigmoidoscopy) you may notice spotting of blood in your stool or on the toilet paper. If you underwent a bowel prep for your procedure, you may not have a normal bowel movement for a few days.  Please Note:  You might notice some irritation and congestion in your nose or some drainage.  This is from the oxygen used during your procedure.  There is no need for concern and it should clear up in a day or so.  SYMPTOMS TO REPORT IMMEDIATELY:   Following lower endoscopy (colonoscopy or flexible sigmoidoscopy):  Excessive amounts of blood in the stool  Significant tenderness or worsening of abdominal pains  Swelling of the abdomen that is new, acute  Fever of 100F or higher    For urgent or emergent issues, a gastroenterologist can be reached at any  hour by calling [phone].   DIET: Your first meal following the procedure should be a small meal and then it is ok to progress to your normal diet. Heavy or fried foods are harder to digest and may make you feel nauseous or bloated.  Likewise, meals heavy in dairy and vegetables can increase bloating.  Drink plenty of fluids but you should avoid alcoholic beverages for 24 hours.  ACTIVITY:  You should plan to take it easy for the rest of today and you should NOT DRIVE or use heavy machinery until tomorrow (because of the sedation medicines used during the test).    FOLLOW UP: Our staff will call the number listed on your records the next business day following your procedure to check on you and address any questions or concerns that you may have regarding the information given to you following your procedure. If we do not reach you, we will leave a message.  However, if you are feeling well and you are not experiencing any problems, there is no need to return our call.  We will assume that you have returned to your regular daily activities without incident.  If any biopsies were taken you will be contacted by phone or by letter within the next 1-3 weeks.  Please call us at [phone] if you have not heard about the biopsies in 3 weeks.    SIGNATURES/CONFIDENTIALITY: You and/or your care partner have signed paperwork  which will be entered into your electronic medical record.  These signatures attest to the fact that that the information above on your After Visit Summary has been reviewed and is understood.  Full responsibility of the confidentiality of this discharge information lies with you and/or your care-partner.   Polyp information given.

## 2014-10-28 NOTE — Progress Notes (Signed)
Called to room to assist during endoscopic procedure.  Patient ID and intended procedure confirmed with present staff. Received instructions for my participation in the procedure from the performing physician.  

## 2014-10-31 ENCOUNTER — Telehealth: Payer: Self-pay

## 2014-10-31 NOTE — Telephone Encounter (Signed)
  Follow up Call-  Call back number 10/28/2014  Post procedure Call Back phone  # [phone]  Permission to leave phone message Yes     Patient questions:  Do you have a fever, pain , or abdominal swelling? No. Pain Score  0 *  Have you tolerated food without any problems? Yes.    Have you been able to return to your normal activities? Yes.    Do you have any questions about your discharge instructions: Diet   No. Medications  No. Follow up visit  No.  Do you have questions or concerns about your Care? No.  Actions: * If pain score is 4 or above: No action needed, pain <4.

## 2014-11-03 ENCOUNTER — Encounter: Payer: Self-pay | Admitting: Internal Medicine

## 2014-11-03 DIAGNOSIS — Z860100 Personal history of colon polyps, unspecified: Secondary | ICD-10-CM | POA: Insufficient documentation

## 2014-11-03 HISTORY — DX: Personal history of colonic polyps: Z86.010

## 2014-11-03 HISTORY — DX: Personal history of colon polyps, unspecified: Z86.0100

## 2014-11-03 NOTE — Progress Notes (Signed)
Quick Note:  Sessile serrated polyp 12 mm - repeat colonoscopy 2019 ______

## 2015-01-06 ENCOUNTER — Other Ambulatory Visit: Payer: Self-pay | Admitting: Internal Medicine

## 2015-01-10 ENCOUNTER — Ambulatory Visit (INDEPENDENT_AMBULATORY_CARE_PROVIDER_SITE_OTHER): Payer: 59 | Admitting: Family

## 2015-01-10 ENCOUNTER — Encounter: Payer: Self-pay | Admitting: Family

## 2015-01-10 VITALS — BP 124/82 | HR 67 | Temp 98.1°F | Resp 18 | Ht 64.0 in | Wt 226.0 lb

## 2015-01-10 DIAGNOSIS — Z1231 Encounter for screening mammogram for malignant neoplasm of breast: Secondary | ICD-10-CM | POA: Diagnosis not present

## 2015-01-10 DIAGNOSIS — I10 Essential (primary) hypertension: Secondary | ICD-10-CM

## 2015-01-10 MED ORDER — METOPROLOL SUCCINATE ER 50 MG PO TB24: ORAL_TABLET | ORAL | Status: DC

## 2015-01-10 MED ORDER — LOSARTAN POTASSIUM-HCTZ 100-25 MG PO TABS: ORAL_TABLET | ORAL | Status: DC

## 2015-01-10 NOTE — Progress Notes (Signed)
Subjective:    Patient ID: [name], female    DOB: 22-Jul-1951, 64 y.o.   MRN: [id number]  Chief Complaint  Patient presents with  . Medication Refill    Refills on BP meds    HPI:  [name] is a 64 y.o. female with a PMH of hypertension, neuropathy, osteoarthritis, anxiety, and rheumatoid arthritis who presents today for an office visit for medication refill   1.) Blood pressure - Reports that her blood pressure is doing well. Currently takes it rarely. Takes it out at the store and they are below the goal of 140/90. Currently taking losartan-HCTZ and metoprolol. Takes the medication as prescribed and denies any adverse side effects.   BP Readings from Last 3 Encounters:  01/10/15 124/82  10/28/14 149/71  08/22/14 140/84    2.) Overdue for mammogram - overdue for mammogram. Requesting referral for mammogram.   No Known Allergies   Current Outpatient Prescriptions on File Prior to Visit  Medication Sig Dispense Refill  . acetaminophen (TYLENOL) 325 MG tablet Take 325 mg by mouth every 6 (six) hours as needed.    . ALPRAZolam (XANAX) 0.5 MG tablet TAKE ONE TABLET BY MOUTH EVERY DAY AS NEEDED FOR ANXIETY 30 tablet 0  . aspirin 81 MG tablet Take 81 mg by mouth daily.    . Biotin (BIOTIN 5000) 5 MG CAPS Take by mouth daily.    . Calcium-Magnesium-Vitamin D [id number] MG-MG-UNIT TB24 Take 4 tablets by mouth daily.    Marland Kitchen gabapentin (NEURONTIN) 100 MG capsule One pill every eight hours as needed; dose may be increased by one pill each dose after 48 hours if only partially effective 30 capsule 2  . Glucosamine-Chondroit-Vit C-Mn (GLUCOSAMINE CHONDR 1500 COMPLX PO) Take 2 capsules by mouth daily.    Marland Kitchen guaiFENesin (MUCINEX) 600 MG 12 hr tablet Take 1,200 mg by mouth 2 (two) times daily as needed for congestion.    . hydrocortisone 2.5 % ointment Apply to affected area BID. 30 g 3  . hydroxychloroquine (PLAQUENIL) 200 MG tablet Take by mouth 2 (two) times daily.    .  Loratadine 10 MG CAPS Take 10 mg by mouth.    . losartan-hydrochlorothiazide (HYZAAR) 100-25 MG per tablet TAKE ONE TABLET BY MOUTH ONCE DAILY 90 tablet 3  . metoprolol succinate (TOPROL-XL) 50 MG 24 hr tablet TAKE 2 TABLETS BY MOUTH ONCE DAILY 180 tablet 3  . ranitidine (ZANTAC) 150 MG tablet Take 150 mg by mouth daily as needed.    . sulfaSALAzine (AZULFIDINE) 500 MG tablet Take 1,000 mg by mouth 2 (two) times daily.    . Turmeric 450 MG CAPS Take 1 capsule by mouth daily.    . cholecalciferol (VITAMIN D) 1000 UNITS tablet Take 1 tablet (1,000 Units total) by mouth daily. (Patient taking differently: Take 4,000 Units by mouth daily. )     No current facility-administered medications on file prior to visit.      Review of Systems  Eyes:       Negative for changes in vision  Respiratory: Negative for chest tightness and shortness of breath.   Cardiovascular: Negative for chest pain, palpitations and leg swelling.  Neurological: Negative for headaches.      Objective:    BP 124/82 mmHg  Pulse 67  Temp(Src) 98.1 F (36.7 C) (Oral)  Resp 18  Ht 5\' 4"  (1.626 m)  Wt 226 lb (102.513 kg)  BMI 38.77 kg/m2  SpO2 95% Nursing note and vital signs reviewed.  Physical Exam  Constitutional: She is oriented to person, place, and time. She appears well-developed and well-nourished. No distress.  Cardiovascular: Normal rate, regular rhythm, normal heart sounds and intact distal pulses.   Pulmonary/Chest: Effort normal and breath sounds normal.  Neurological: She is alert and oriented to person, place, and time.  Skin: Skin is warm and dry.  Psychiatric: She has a normal mood and affect. Her behavior is normal. Judgment and thought content normal.       Assessment & Plan:   Problem List Items Addressed This Visit      Cardiovascular and Mediastinum   Hypertension - Primary    Hypertension remains well controlled with current regimen of metoprolol and losartan-hydrochlorothiazide.  Denies adverse side effects. Continue current medication regimen and dosage of metoprolol and losartan-hydrochlorothiazide. Follow-up with PCP for annual exam.      Relevant Medications   metoprolol succinate (TOPROL-XL) 50 MG 24 hr tablet   losartan-hydrochlorothiazide (HYZAAR) 100-25 MG per tablet     Other   Encounter for screening mammogram for breast cancer    Referral for mammogram placed.      Relevant Orders   MM DIGITAL SCREENING BILATERAL

## 2015-01-10 NOTE — Assessment & Plan Note (Signed)
Hypertension remains well controlled with current regimen of metoprolol and losartan-hydrochlorothiazide. Denies adverse side effects. Continue current medication regimen and dosage of metoprolol and losartan-hydrochlorothiazide. Follow-up with PCP for annual exam.

## 2015-01-10 NOTE — Assessment & Plan Note (Signed)
Referral for mammogram placed 

## 2015-01-10 NOTE — Progress Notes (Signed)
Pre visit review using our clinic review tool, if applicable. No additional management support is needed unless otherwise documented below in the visit note. 

## 2015-01-10 NOTE — Patient Instructions (Addendum)
Thank you for choosing [facility].  Summary/Instructions:  Please continue to take your medications as prescribed.   Your prescription(s) have been submitted to your pharmacy or been printed and provided for you. Please take as directed and contact our office if you believe you are having problem(s) with the medication(s) or have any questions.  If your symptoms worsen or fail to improve, please contact our office for further instruction, or in case of emergency go directly to the emergency room at the closest medical facility.     

## 2015-01-17 ENCOUNTER — Telehealth: Payer: Self-pay | Admitting: Geriatric Medicine

## 2015-01-17 NOTE — Telephone Encounter (Signed)
Mammogram ordered on 01/10/15 by Terri Piedra.

## 2015-02-13 ENCOUNTER — Other Ambulatory Visit: Payer: Self-pay

## 2015-04-21 LAB — BASIC METABOLIC PANEL
BUN: 13 mg/dL (ref 4–21)
Creatinine: 0.8 mg/dL (ref 0.5–1.1)
GLUCOSE: 96 mg/dL
Potassium: 4.2 mmol/L (ref 3.4–5.3)
Sodium: 138 mmol/L (ref 137–147)

## 2015-04-21 LAB — CBC AND DIFFERENTIAL
HEMATOCRIT: 41 % (ref 36–46)
Hemoglobin: 14.5 g/dL (ref 12.0–16.0)
PLATELETS: 229 10*3/uL (ref 150–399)
WBC: 5.6 10*3/mL

## 2015-04-21 LAB — HEPATIC FUNCTION PANEL
ALT: 17 U/L (ref 7–35)
AST: 16 U/L (ref 13–35)
Alkaline Phosphatase: 71 U/L (ref 25–125)
BILIRUBIN, TOTAL: 0.4 mg/dL

## 2015-05-04 ENCOUNTER — Encounter: Payer: Self-pay | Admitting: Internal Medicine

## 2015-05-04 ENCOUNTER — Ambulatory Visit (INDEPENDENT_AMBULATORY_CARE_PROVIDER_SITE_OTHER): Payer: 59 | Admitting: Internal Medicine

## 2015-05-04 VITALS — BP 130/76 | HR 67 | Temp 98.4°F | Resp 18 | Wt 222.0 lb

## 2015-05-04 DIAGNOSIS — J310 Chronic rhinitis: Secondary | ICD-10-CM | POA: Diagnosis not present

## 2015-05-04 DIAGNOSIS — R5383 Other fatigue: Secondary | ICD-10-CM | POA: Diagnosis not present

## 2015-05-04 DIAGNOSIS — Z23 Encounter for immunization: Secondary | ICD-10-CM

## 2015-05-04 DIAGNOSIS — M5414 Radiculopathy, thoracic region: Secondary | ICD-10-CM | POA: Diagnosis not present

## 2015-05-04 MED ORDER — TRAMADOL HCL 50 MG PO TABS: 50.0000 mg | ORAL_TABLET | Freq: Three times a day (TID) | ORAL | Status: DC | PRN

## 2015-05-04 NOTE — Patient Instructions (Signed)
The best exercises for the low back include freestyle swimming, stretch aerobics, and yoga.Cybex & Nautilus machines rather than dead weights are better for the back.  Plain Mucinex (NOT D) for thick secretions ;force NON dairy fluids .   Nasal cleansing in the shower as discussed with lather of mild shampoo.After 10 seconds wash off lather while  exhaling through nostrils. Make sure that all residual soap is removed to prevent irritation.  Fluticasone 1 spray in each nostril twice a day as needed. Use the "crossover" technique into opposite nostril spraying toward opposite ear @ 45 degree angle, not straight up into nostril.  Use a Neti pot daily only  as needed for significant sinus congestion; going from open side to congested side . Plain Allegra (NOT D )  160 daily , Loratidine 10 mg , OR Zyrtec 10 mg @ bedtime  as needed for itchy eyes & sneezing.

## 2015-05-04 NOTE — Progress Notes (Signed)
Pre visit review using our clinic review tool, if applicable. No additional management support is needed unless otherwise documented below in the visit note. 

## 2015-05-04 NOTE — Progress Notes (Signed)
   Subjective:    Patient ID: [name], female    DOB: [birth year], 64 y.o.   MRN: [id number]  HPI She began to have discomfort in the mid thoracic area with a constant aching pain across the back. There was no definite trigger or injury although she had been doing laundry yesterday. Deep breathing has caused a nonproductive cough. There is no pleuritic component to her back pain.  She did have sinus infection 2-3  weeks ago which resolved with medications. She states that her ears are still feeling clogged .She has no residual upper respiratory tract symptoms otherwise. Also she has some fatigue.  Review of Systems  Fever, chills, sweats, or unexplained weight loss not present. Vertigo, near syncope or imbalance denied. There is no numbness, tingling, or weakness in extremities.   No loss of control of bladder or bowels.  There is no significant cough, sputum production,hemoptysis, wheezing,or  paroxysmal nocturnal dyspnea. Abdominal pain, significant dyspepsia, dysphagia, melena, rectal bleeding, or persistently small caliber stools are not present. Dysuria, pyuria, hematuria, frequency, nocturia or polyuria are denied. Frontal headache, facial pain , nasal purulence, dental pain, sore throat , otic pain or otic discharge denied.     Objective:   Physical Exam Pertinent or positive findings include: Otic canals are clear; tympanic membranes are slightly dull. There is erythema of the nasal mucosa. Abdomen is protuberant. Varus changes are noted of the knees. She has very subtle DIP osteoarthritic changes. She has crepitus in her knees. Homans sign is negative. Tone & DTRs normal. Strength excellent to opposition in upper and lower extremities. Heel and toe walking were completed without difficulty. Straight leg raising is negative.   General appearance :adequately nourished; in no distress.  Eyes: No conjunctival inflammation or scleral icterus is present.  Oral exam:  Lips and  gums are healthy appearing.There is no oropharyngeal erythema or exudate noted. Dental hygiene is good.  Heart:  Normal rate and regular rhythm. S1 and S2 normal without gallop, murmur, click, rub or other extra sounds    Lungs:Chest clear to auscultation; no wheezes, rhonchi,rales ,or rubs present.No increased work of breathing.   Abdomen: bowel sounds normal, soft and non-tender without masses, organomegaly or hernias noted.  No guarding or rebound. No flank tenderness to percussion.  Vascular : all pulses equal ; no bruits present.  Skin:Warm & dry.  Intact without suspicious lesions or rashes ; no tenting or jaundice   Lymphatic: No lymphadenopathy is noted about the head, neck, axilla      Assessment & Plan:  #1 thoracic radiculopathy  #2 nonallergic rhinitis with component of Eustachian tube dysfunction  See orders

## 2015-05-16 ENCOUNTER — Encounter: Payer: Self-pay | Admitting: Family Medicine

## 2015-05-16 ENCOUNTER — Ambulatory Visit (INDEPENDENT_AMBULATORY_CARE_PROVIDER_SITE_OTHER): Payer: 59 | Admitting: Family Medicine

## 2015-05-16 VITALS — BP 153/82 | HR 69 | Temp 98.8°F | Ht 64.0 in | Wt 227.0 lb

## 2015-05-16 DIAGNOSIS — K1120 Sialoadenitis, unspecified: Secondary | ICD-10-CM | POA: Diagnosis not present

## 2015-05-16 MED ORDER — AMOXICILLIN-POT CLAVULANATE 875-125 MG PO TABS: 1.0000 | ORAL_TABLET | Freq: Two times a day (BID) | ORAL | Status: DC

## 2015-05-16 NOTE — Progress Notes (Signed)
Pre visit review using our clinic review tool, if applicable. No additional management support is needed unless otherwise documented below in the visit note. 

## 2015-05-16 NOTE — Progress Notes (Signed)
   Subjective:    Patient ID: [name], female    DOB: 1950/08/25, 64 y.o.   MRN: [id number]  HPI Here for 2 days of swelling and pain in the right cheek. She thinks she had a low grade fever last night. She has a mild headache. No ST or cough. About 2 weeks ago she finished a course of Cefuroxime given to her by her ENT, Dr. Ernesto Rutherford, for a sinus infection.    Review of Systems  Constitutional: Positive for fever.  HENT: Positive for facial swelling. Negative for congestion, ear pain, mouth sores, nosebleeds, postnasal drip, sinus pressure and sore throat.   Eyes: Negative.   Respiratory: Negative.        Objective:   Physical Exam  Constitutional: She appears well-developed and well-nourished.  HENT:  Right Ear: External ear normal.  Left Ear: External ear normal.  Nose: Nose normal.  Mouth/Throat: Oropharynx is clear and moist.  The lower right cheek is swollen, tender, and warm to the touch. No visible rash   Eyes: Conjunctivae are normal.  Neck: Neck supple. No thyromegaly present.  Pulmonary/Chest: Effort normal and breath sounds normal.  Lymphadenopathy:    She has no cervical adenopathy.          Assessment & Plan:  Salivary gland infection. Use warm compresses and given 10 days of Augmentin. Recheck prn

## 2015-05-30 ENCOUNTER — Other Ambulatory Visit (INDEPENDENT_AMBULATORY_CARE_PROVIDER_SITE_OTHER): Payer: 59

## 2015-05-30 ENCOUNTER — Encounter: Payer: Self-pay | Admitting: Family

## 2015-05-30 ENCOUNTER — Ambulatory Visit (INDEPENDENT_AMBULATORY_CARE_PROVIDER_SITE_OTHER): Payer: 59 | Admitting: Family

## 2015-05-30 VITALS — BP 138/82 | HR 79 | Temp 97.8°F | Resp 18 | Ht 64.0 in | Wt 225.8 lb

## 2015-05-30 DIAGNOSIS — R221 Localized swelling, mass and lump, neck: Secondary | ICD-10-CM

## 2015-05-30 LAB — CBC WITH DIFFERENTIAL/PLATELET
BASOS ABS: 0.1 10*3/uL (ref 0.0–0.1)
Basophils Relative: 0.7 % (ref 0.0–3.0)
EOS ABS: 0.2 10*3/uL (ref 0.0–0.7)
Eosinophils Relative: 2.7 % (ref 0.0–5.0)
HCT: 43.3 % (ref 36.0–46.0)
HEMOGLOBIN: 14.5 g/dL (ref 12.0–15.0)
Lymphocytes Relative: 33.3 % (ref 12.0–46.0)
Lymphs Abs: 2.4 10*3/uL (ref 0.7–4.0)
MCHC: 33.4 g/dL (ref 30.0–36.0)
MCV: 83.8 fl (ref 78.0–100.0)
MONO ABS: 0.6 10*3/uL (ref 0.1–1.0)
Monocytes Relative: 8 % (ref 3.0–12.0)
Neutro Abs: 4 10*3/uL (ref 1.4–7.7)
Neutrophils Relative %: 55.3 % (ref 43.0–77.0)
Platelets: 240 10*3/uL (ref 150.0–400.0)
RBC: 5.17 Mil/uL — AB (ref 3.87–5.11)
RDW: 13.4 % (ref 11.5–15.5)
WBC: 7.2 10*3/uL (ref 4.0–10.5)

## 2015-05-30 LAB — TSH: TSH: 1.21 u[IU]/mL (ref 0.35–4.50)

## 2015-05-30 NOTE — Progress Notes (Signed)
Pre visit review using our clinic review tool, if applicable. No additional management support is needed unless otherwise documented below in the visit note. 

## 2015-05-30 NOTE — Assessment & Plan Note (Signed)
Neck mass of questionable origin most likely lymph node. Obtain ultrasound of the neck. Obtain CBC w diff to rule out infection and TSH to rule out thyroid dysfunction. No airway compromise. Follow up pending ultrasound and blood work results.

## 2015-05-30 NOTE — Progress Notes (Signed)
Subjective:    Patient ID: [name], female    DOB: [birth year], 64 y.o.   MRN: [id number]  Chief Complaint  Patient presents with  . Cyst    has a knot that has come up on her throat that was sore to touch, started after finishing antibiotics for a sinus infection    HPI:  [name] is a 64 y.o. female who  has a past medical history of Hypertension; Rheumatoid arthritis(714.0) (dx 2010); Pseudogout of foot; GERD (gastroesophageal reflux disease); Allergy; Cataract; and Hx of colonic polyp - ssp (11/03/2014). and presents today for an acute office visit.  This is a new problem. Associated symptom of a knot located on the left aspect of her neck under her chin has been gong on form about 3 days. Recently seen and treated for a sinus infection. Notes that she has some increased swelling of her jaw that were related to 2 infected teeth. The teeth were removed by dentistry and then she noted a small mass on the left side of her neck. Notes that it fluctuates in size. Since initial onset it has gotten slightly better but then swells again. Describes a feeling of uncomfortableness that feels like it is pulling occasionally. Denies difficulty swallowing or shortness of breath.    No Known Allergies   Current Outpatient Prescriptions on File Prior to Visit  Medication Sig Dispense Refill  . acetaminophen (TYLENOL) 325 MG tablet Take 325 mg by mouth every 6 (six) hours as needed.    . ALPRAZolam (XANAX) 0.5 MG tablet TAKE ONE TABLET BY MOUTH EVERY DAY AS NEEDED FOR ANXIETY 30 tablet 0  . amoxicillin-clavulanate (AUGMENTIN) 875-125 MG tablet Take 1 tablet by mouth 2 (two) times daily. 20 tablet 0  . aspirin 81 MG tablet Take 81 mg by mouth daily.    . Biotin (BIOTIN 5000) 5 MG CAPS Take by mouth daily.    . Calcium-Magnesium-Vitamin D 469-62-952 MG-MG-UNIT TB24 Take 4 tablets by mouth daily.    Marland Kitchen gabapentin (NEURONTIN) 100 MG capsule One pill every eight hours as needed; dose may be  increased by one pill each dose after 48 hours if only partially effective 30 capsule 2  . Glucosamine-Chondroit-Vit C-Mn (GLUCOSAMINE CHONDR 1500 COMPLX PO) Take 2 capsules by mouth daily.    Marland Kitchen guaiFENesin (MUCINEX) 600 MG 12 hr tablet Take 1,200 mg by mouth 2 (two) times daily as needed for congestion.    . hydrocortisone 2.5 % ointment Apply to affected area BID. 30 g 3  . hydroxychloroquine (PLAQUENIL) 200 MG tablet Take 200 mg by mouth 2 (two) times daily.     . Loratadine 10 MG CAPS Take 10 mg by mouth.    . losartan-hydrochlorothiazide (HYZAAR) 100-25 MG per tablet TAKE ONE TABLET BY MOUTH ONCE DAILY 90 tablet 0  . metoprolol succinate (TOPROL-XL) 50 MG 24 hr tablet TAKE 2 TABLETS BY MOUTH ONCE DAILY 180 tablet 0  . Misc Natural Products (NARCOSOFT HERBAL LAX PO) Take by mouth.    . ranitidine (ZANTAC) 150 MG tablet Take 150 mg by mouth daily as needed.    . sulfaSALAzine (AZULFIDINE) 500 MG tablet Take 1,000 mg by mouth 2 (two) times daily.    . Turmeric 450 MG CAPS Take 1 capsule by mouth daily.    . cholecalciferol (VITAMIN D) 1000 UNITS tablet Take 1 tablet (1,000 Units total) by mouth daily. (Patient taking differently: Take 4,000 Units by mouth daily. )     No current  facility-administered medications on file prior to visit.    Past Medical History  Diagnosis Date  . Hypertension   . Rheumatoid arthritis(714.0) dx 2010  . Pseudogout of foot   . GERD (gastroesophageal reflux disease)   . Allergy   . Cataract     BILATERAL-REMOVED 2 YEARS AGO  . Hx of colonic polyp - ssp 11/03/2014    Past Surgical History  Procedure Laterality Date  . Breast biopsy  1972  . Broken wrist  2010  . Pneumonia  2007  . Inner ear surgery      busted ear drum  . Cataract extraction  03/2012    left  . Cosmetic surgery    . Fracture surgery    . Colonoscopy       Review of Systems  Constitutional: Positive for chills. Negative for fever, diaphoresis, fatigue and unexpected weight  change.  Respiratory: Positive for chest tightness. Negative for cough, wheezing and stridor.   Cardiovascular: Negative for chest pain, palpitations and leg swelling.      Objective:    BP 138/82 mmHg  Pulse 79  Temp(Src) 97.8 F (36.6 C) (Oral)  Resp 18  Ht 5\' 4"  (1.626 m)  Wt 225 lb 12.8 oz (102.422 kg)  BMI 38.74 kg/m2  SpO2 96% Nursing note and vital signs reviewed.  Physical Exam  Constitutional: She is oriented to person, place, and time. She appears well-developed and well-nourished. No distress.  Neck: Trachea normal. Neck supple. No edema present. No thyromegaly present.    Cardiovascular: Normal rate, regular rhythm, normal heart sounds and intact distal pulses.   Pulmonary/Chest: Effort normal and breath sounds normal.  Neurological: She is alert and oriented to person, place, and time.  Skin: Skin is warm and dry.  Psychiatric: She has a normal mood and affect. Her behavior is normal. Judgment and thought content normal.       Assessment & Plan:   Problem List Items Addressed This Visit      Other   Neck mass - Primary    Neck mass of questionable origin most likely lymph node. Obtain ultrasound of the neck. Obtain CBC w diff to rule out infection and TSH to rule out thyroid dysfunction. No airway compromise. Follow up pending ultrasound and blood work results.       Relevant Orders   US Soft Tissue Head/Neck   CBC w/Diff (Completed)   TSH (Completed)

## 2015-05-30 NOTE — Patient Instructions (Signed)
Thank you for choosing [facility].  Summary/Instructions:  Please stop by the lab on the basement level of the building for your blood work. Your results will be released to [facility] (or called to you) after review, usually within 72 hours after test completion. If any changes need to be made, you will be notified at that same time.  If your symptoms worsen or fail to improve, please contact our office for further instruction, or in case of emergency go directly to the emergency room at the closest medical facility.   Lymphadenopathy Lymphadenopathy refers to swollen or enlarged lymph glands, also called lymph nodes. Lymph glands are part of your body's defense (immune) system, which protects the body from infections, germs, and diseases. Lymph glands are found in many locations in your body, including the neck, underarm, and groin.  Many things can cause lymph glands to become enlarged. When your immune system responds to germs, such as viruses or bacteria, infection-fighting cells and fluid build up. This causes the glands to grow in size. Usually, this is not something to worry about. The swelling and any soreness often go away without treatment. However, swollen lymph glands can also be caused by a number of diseases. Your health care provider may do various tests to help determine the cause. If the cause of your swollen lymph glands cannot be found, it is important to monitor your condition to make sure the swelling goes away. HOME CARE INSTRUCTIONS Watch your condition for any changes. The following actions may help to lessen any discomfort you are feeling:  Get plenty of rest.  Take medicines only as directed by your health care provider. Your health care provider may recommend over-the-counter medicines for pain.  Apply moist heat compresses to the site of swollen lymph nodes as directed by your health care provider. This can help reduce any pain.  Check your lymph nodes daily for  any changes.  Keep all follow-up visits as directed by your health care provider. This is important. SEEK MEDICAL CARE IF:  Your lymph nodes are still swollen after 2 weeks.  Your swelling increases or spreads to other areas.  Your lymph nodes are hard, seem fixed to the skin, or are growing rapidly.  Your skin over the lymph nodes is red and inflamed.  You have a fever.  You have chills.  You have fatigue.  You develop a sore throat.  You have abdominal pain.  You have weight loss.  You have night sweats. SEEK IMMEDIATE MEDICAL CARE IF:  You notice fluid leaking from the area of the enlarged lymph node.  You have severe pain in any area of your body.  You have chest pain.  You have shortness of breath.   This information is not intended to replace advice given to you by your health care provider. Make sure you discuss any questions you have with your health care provider.   Document Released: 05/14/2008 Document Revised: 08/26/2014 Document Reviewed: 03/10/2014 Elsevier Interactive Patient Education [employer].

## 2015-06-02 ENCOUNTER — Other Ambulatory Visit: Payer: Self-pay | Admitting: Family

## 2015-06-02 ENCOUNTER — Encounter: Payer: Self-pay | Admitting: Family

## 2015-06-02 ENCOUNTER — Ambulatory Visit
Admission: RE | Admit: 2015-06-02 | Discharge: 2015-06-02 | Disposition: A | Discharge status: Home / Self Care | Payer: 59 | Source: Ambulatory Visit | Attending: Family | Admitting: Family

## 2015-06-02 DIAGNOSIS — R221 Localized swelling, mass and lump, neck: Secondary | ICD-10-CM

## 2015-06-05 ENCOUNTER — Encounter: Payer: Self-pay | Admitting: Family

## 2015-06-05 ENCOUNTER — Other Ambulatory Visit (INDEPENDENT_AMBULATORY_CARE_PROVIDER_SITE_OTHER): Payer: 59

## 2015-06-05 DIAGNOSIS — R221 Localized swelling, mass and lump, neck: Secondary | ICD-10-CM

## 2015-06-05 LAB — BASIC METABOLIC PANEL
BUN: 10 mg/dL (ref 6–23)
CO2: 29 mEq/L (ref 19–32)
Calcium: 10.4 mg/dL (ref 8.4–10.5)
Chloride: 100 mEq/L (ref 96–112)
Creatinine, Ser: 0.86 mg/dL (ref 0.40–1.20)
GFR: 70.08 mL/min (ref >60.00)
Glucose, Bld: 99 mg/dL (ref 70–99)
POTASSIUM: 3.4 meq/L — AB (ref 3.5–5.1)
Sodium: 137 mEq/L (ref 135–145)

## 2015-06-06 ENCOUNTER — Ambulatory Visit (INDEPENDENT_AMBULATORY_CARE_PROVIDER_SITE_OTHER)
Admission: RE | Admit: 2015-06-06 | Discharge: 2015-06-06 | Disposition: A | Discharge status: Home / Self Care | Payer: 59 | Source: Ambulatory Visit | Attending: Family | Admitting: Family

## 2015-06-06 DIAGNOSIS — R221 Localized swelling, mass and lump, neck: Secondary | ICD-10-CM

## 2015-06-06 MED ORDER — IOHEXOL 300 MG/ML  SOLN
75.0000 mL | Freq: Once | INTRAMUSCULAR | Status: AC | PRN
  Administered 2015-06-06: 75 mL via INTRAVENOUS

## 2015-06-08 ENCOUNTER — Encounter: Payer: Self-pay | Admitting: Family

## 2015-06-08 ENCOUNTER — Other Ambulatory Visit: Payer: Self-pay | Admitting: Family

## 2015-06-08 MED ORDER — LEVOFLOXACIN 500 MG PO TABS: 500.0000 mg | ORAL_TABLET | Freq: Every day | ORAL | Status: DC

## 2015-06-28 ENCOUNTER — Encounter: Payer: Self-pay | Admitting: Internal Medicine

## 2015-07-21 ENCOUNTER — Ambulatory Visit (INDEPENDENT_AMBULATORY_CARE_PROVIDER_SITE_OTHER): Payer: 59 | Admitting: Podiatry

## 2015-07-21 ENCOUNTER — Encounter: Payer: Self-pay | Admitting: Podiatry

## 2015-07-21 VITALS — BP 135/68 | HR 70 | Resp 16

## 2015-07-21 DIAGNOSIS — G629 Polyneuropathy, unspecified: Secondary | ICD-10-CM

## 2015-07-21 DIAGNOSIS — B0229 Other postherpetic nervous system involvement: Secondary | ICD-10-CM | POA: Diagnosis not present

## 2015-07-21 MED ORDER — GABAPENTIN 400 MG PO CAPS: 400.0000 mg | ORAL_CAPSULE | Freq: Three times a day (TID) | ORAL | Status: DC

## 2015-07-21 NOTE — Progress Notes (Signed)
   Subjective:    Patient ID: [name], female    DOB: [birth year], 64 y.o.   MRN: [id number]  HPI Comments: "They think I have neuropathy"  Patient presents with: Foot Pain: Plantar bilateral - severe burning for about 2 years, but has worsened in the last year, can't walk for long periods without the burning sensations, notices that they get "blood red" at times, tries to wear loose socks and shoes-her rheumatologist and PCP thinks she may have neuropathy-recommendation/treatment? NOTE: Patient is on Gabapentin-initially started when she has shingles-is currently taking 100 mg BID    Foot Pain Associated symptoms include arthralgias.      Review of Systems  HENT: Positive for hearing loss and sinus pressure.   Musculoskeletal: Positive for arthralgias.  All other systems reviewed and are negative.      Objective:   Physical Exam        Assessment & Plan:

## 2015-07-24 NOTE — Progress Notes (Signed)
Subjective:     Patient ID: [name], female   DOB: [birth year], 64 y.o.   MRN: [id number]  HPI patient presents stating that she is getting some burning pains in both her feet and they can get blood read at times she's been trying to wear loose socks and the diagnosis of neuropathy has been given to her   Review of Systems  All other systems reviewed and are negative.      Objective:   Physical Exam  Constitutional: She is oriented to person, place, and time.  Cardiovascular: Intact distal pulses.   Musculoskeletal: Normal range of motion.  Neurological: She is oriented to person, place, and time.  Skin: Skin is warm.  Nursing note and vitals reviewed.  neurovascular status intact muscle strength adequate range of motion within normal with patient noted to have discomfort in the plantar aspect of both feet and does have mild diminishment of sharp dull and vibratory. Patient also may have some vascular abnormalities even though she does have good pulses and good digital perfusion     Assessment:     Difficult to say if this may not be some kind of a hormonal issue or other systemic issue creating the changes she is experiencing    Plan:     H&P conditions reviewed and we will increase her gabapentin and see if she responds well to this. May require other treatments depending on response

## 2015-08-17 ENCOUNTER — Other Ambulatory Visit: Payer: Self-pay | Admitting: Family

## 2015-08-25 MED FILL — GABAPENTIN 100 MG CAPSULE: 100 | 90 days supply | Qty: 360 | Fill #0

## 2015-09-08 ENCOUNTER — Encounter: Payer: Self-pay | Admitting: Family Medicine

## 2015-09-08 ENCOUNTER — Ambulatory Visit (INDEPENDENT_AMBULATORY_CARE_PROVIDER_SITE_OTHER): Payer: 59 | Admitting: Family Medicine

## 2015-09-08 VITALS — BP 118/82 | HR 88 | Temp 98.6°F | Ht 64.0 in | Wt 231.7 lb

## 2015-09-08 DIAGNOSIS — G629 Polyneuropathy, unspecified: Secondary | ICD-10-CM

## 2015-09-08 DIAGNOSIS — K219 Gastro-esophageal reflux disease without esophagitis: Secondary | ICD-10-CM

## 2015-09-08 DIAGNOSIS — M069 Rheumatoid arthritis, unspecified: Secondary | ICD-10-CM

## 2015-09-08 DIAGNOSIS — I10 Essential (primary) hypertension: Secondary | ICD-10-CM

## 2015-09-08 DIAGNOSIS — E669 Obesity, unspecified: Secondary | ICD-10-CM

## 2015-09-08 DIAGNOSIS — R221 Localized swelling, mass and lump, neck: Secondary | ICD-10-CM

## 2015-09-08 DIAGNOSIS — E785 Hyperlipidemia, unspecified: Secondary | ICD-10-CM

## 2015-09-08 DIAGNOSIS — Z23 Encounter for immunization: Secondary | ICD-10-CM | POA: Diagnosis not present

## 2015-09-08 NOTE — Progress Notes (Signed)
Pre visit review using our clinic review tool, if applicable. No additional management support is needed unless otherwise documented below in the visit note. 

## 2015-09-08 NOTE — Addendum Note (Signed)
Addended by: Agnes Lawrence on: 09/08/2015 04:19 PM   Modules accepted: Orders

## 2015-09-08 NOTE — Patient Instructions (Signed)
BEFORE YOU LEAVE: -PCV13 -schedule a physical exam in about 3 months - come fasting for labs that day  Please schedule your mammogram  Call your Ear nose and throat doctor today to schedule an appointment about the swelling in the neck  Consider slowly increasing your neurontin to see if this helps with the neuropathy  We recommend the following healthy lifestyle measures: - eat a healthy whole foods diet consisting of regular small meals composed of vegetables, fruits, beans, nuts, seeds, healthy meats such as white chicken and fish and whole grains.  - avoid sweets, white starchy foods, fried foods, fast food, processed foods, sodas, red meet and other fattening foods.  - get a least 150-300 minutes of aerobic exercise per week.

## 2015-09-08 NOTE — Progress Notes (Signed)
HPI:  [name] is here to establish care. She reports she used to see Dr. Asa Lente. Her husband is [name] whom takes care of our computers.  Has the following chronic problems that require follow up and concerns today:  Rheumatoid Arthritis/Peripheral Neuropathy: -managed by her [facility] -meds: gabapentin 200mg  bid, plaquenil, sulfasalazine -her neuropathy in her feet is still bad - sees podiatrist  HTN: -asa, losartan-hctz 100-25, toprol xl 50mg  -denies: hx palpitations, hx tachy, CP, SOB  GERD: -takes zantac -no hx stricture, no dysphagia  Seasonal allergies: -take loratidine, musinex -sees Dr. Marcelline Mates - ENT -neck LAD - has not resolved, did not follow up on this  ROS negative for unless reported above: fevers, unintentional weight loss, hearing or vision loss, chest pain, palpitations, struggling to breath, hemoptysis, melena, hematochezia, hematuria, falls, loc, si, thoughts of self harm  Past Medical History  Diagnosis Date  . Hypertension   . Rheumatoid arthritis(714.0) dx 2010  . Pseudogout of foot   . GERD (gastroesophageal reflux disease)   . Allergy   . Cataract     BILATERAL-REMOVED 2 YEARS AGO  . Hx of colonic polyp - ssp 11/03/2014  . 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 Osteopenia 10/17/2011    DEXA 09/2007: -1.4 L fem; 10/2011: -1.2 L fem     Past Surgical History  Procedure Laterality Date  . Breast biopsy  1972  . Broken wrist  2010  . Pneumonia  2007  . Inner ear surgery      busted ear drum  . Cataract extraction  03/2012    left  . Cosmetic surgery    . Fracture surgery    . Colonoscopy      Family History  Problem Relation Age of Onset  . Hypertension Father   . Hypertension Sister     x 3  . Hypertension Brother   . Heart attack Father   . Hyperthyroidism Sister     x2, s/p RAI ablation  . Hypothyroidism Brother   . Stroke Mother     Social History   Social History  . Marital Status: Married    Spouse Name:  N/A  . Number of Children: 3  . Years of Education: N/A   Occupational History  . Retired    Social History Main Topics  . Smoking status: Former Smoker    Types: Cigarettes  . Smokeless tobacco: Former Systems developer    Quit date: 08/19/1981  . Alcohol Use: No  . Drug Use: No  . Sexual Activity: Not Asked   Other Topics Concern  . None   Social History Narrative   Artist -retired Building control surveyor   Married, lives with spouse, [name], he is IT support for [employer]   3 sons   2 caffeinated beverages a day   No regular exercise, diet is ok     Current outpatient prescriptions:  .  aspirin 81 MG tablet, Take 81 mg by mouth daily., Disp: , Rfl:  .  COLLAGEN PO, Take by mouth., Disp: , Rfl:  .  gabapentin (NEURONTIN) 400 MG capsule, Take 1 capsule (400 mg total) by mouth 3 (three) times daily., Disp: 90 capsule, Rfl: 3 .  guaiFENesin (MUCINEX) 600 MG 12 hr tablet, Take 1,200 mg by mouth 2 (two) times daily as needed for congestion., Disp: , Rfl:  .  hydrocortisone 2.5 % ointment, Apply to affected area BID., Disp: 30 g, Rfl: 3 .  hydroxychloroquine (PLAQUENIL) 200 MG tablet, Take 200  mg by mouth 2 (two) times daily. , Disp: , Rfl:  .  Linoleic Acid-Sunflower Oil (CLA PO), Take by mouth., Disp: , Rfl:  .  Loratadine 10 MG CAPS, Take 10 mg by mouth., Disp: , Rfl:  .  losartan-hydrochlorothiazide (HYZAAR) 100-25 MG tablet, TAKE 1 TABLET BY MOUTH ONCE DAILY, Disp: 90 tablet, Rfl: 0 .  metoprolol succinate (TOPROL-XL) 50 MG 24 hr tablet, TAKE 2 TABLETS BY MOUTH ONCE DAILY, Disp: 180 tablet, Rfl: 0 .  Misc Natural Products (NARCOSOFT HERBAL LAX PO), Take by mouth., Disp: , Rfl:  .  ranitidine (ZANTAC) 150 MG tablet, Take 150 mg by mouth daily as needed., Disp: , Rfl:  .  sulfaSALAzine (AZULFIDINE) 500 MG tablet, Take 1,000 mg by mouth 2 (two) times daily., Disp: , Rfl:  .  Turmeric 450 MG CAPS, Take 1 capsule by mouth daily., Disp: , Rfl:   EXAM:  Filed Vitals:   09/08/15  1505  BP: 118/82  Pulse: 88  Temp: 98.6 F (37 C)    Body mass index is 39.75 kg/(m^2).  GENERAL: vitals reviewed and listed above, alert, oriented, appears well hydrated and in no acute distress  HEENT: atraumatic, conjunttiva clear, no obvious abnormalities on inspection of external nose and ears  NECK: no obvious masses on inspection,somewhat mobile 1.5x2 cm mas L submandibular region  LUNGS: clear to auscultation bilaterally, no wheezes, rales or rhonchi, good air movement  CV: HRRR, no peripheral edema  MS: moves all extremities without noticeable abnormality  PSYCH: pleasant and cooperative, no obvious depression or anxiety  ASSESSMENT AND PLAN:  Discussed the following assessment and plan:  Essential hypertension -stable, cont treatment  Obese Hyperlipidemia -lifestlye recs, activity somewhat limited due to below -labs at CPE  Rheumatoid arthritis, involving unspecified site, unspecified rheumatoid factor presence (HCC) Neuropathy ([place]) -managed by rheum and podiatry -she may consider trial slowly increasing neurotin  Neck mass -advised ENT eval, she agrees to set up appt as see ENT frequently for other issues per her report  Gastroesophageal reflux disease, esophagitis presence not specified -stable, cont current tx  -We reviewed the PMH, PSH, FH, SH, Meds and Allergies. -We provided refills for any medications we will prescribe as needed. -We addressed current concerns per orders and patient instructions. -We have asked for records for pertinent exams, studies, vaccines and notes from previous providers. -We have advised patient to follow up per instructions below.   -Patient advised to return or notify a doctor immediately if symptoms worsen or persist or new concerns arise.  Patient Instructions  BEFORE YOU LEAVE: -PCV13 -schedule a physical exam in about 3 months - come fasting for labs that day  Please schedule your mammogram  Call your Ear  nose and throat doctor today to schedule an appointment about the swelling in the neck  Consider slowly increasing your neurontin to see if this helps with the neuropathy  We recommend the following healthy lifestyle measures: - eat a healthy whole foods diet consisting of regular small meals composed of vegetables, fruits, beans, nuts, seeds, healthy meats such as white chicken and fish and whole grains.  - avoid sweets, white starchy foods, fried foods, fast food, processed foods, sodas, red meet and other fattening foods.  - get a least 150-300 minutes of aerobic exercise per week.       Colin Benton R.

## 2015-09-13 DIAGNOSIS — J320 Chronic maxillary sinusitis: Secondary | ICD-10-CM | POA: Diagnosis not present

## 2015-09-13 DIAGNOSIS — J322 Chronic ethmoidal sinusitis: Secondary | ICD-10-CM | POA: Diagnosis not present

## 2015-09-13 DIAGNOSIS — J370 Chronic laryngitis: Secondary | ICD-10-CM | POA: Diagnosis not present

## 2015-09-13 MED FILL — FLUCONAZOLE 150 MG TABLET: 150 | 3 days supply | Qty: 3 | Fill #0

## 2015-09-13 MED FILL — CLINDAMYCIN HCL 150 MG CAPS: 150 | 10 days supply | Qty: 30 | Fill #0

## 2015-09-15 MED FILL — sulfaSALAzine 500 MG TBEC: 500 | 30 days supply | Qty: 120 | Fill #2

## 2015-09-20 DIAGNOSIS — J370 Chronic laryngitis: Secondary | ICD-10-CM | POA: Diagnosis not present

## 2015-09-20 DIAGNOSIS — J320 Chronic maxillary sinusitis: Secondary | ICD-10-CM | POA: Diagnosis not present

## 2015-09-20 DIAGNOSIS — J322 Chronic ethmoidal sinusitis: Secondary | ICD-10-CM | POA: Diagnosis not present

## 2015-09-25 DIAGNOSIS — G9009 Other idiopathic peripheral autonomic neuropathy: Secondary | ICD-10-CM | POA: Diagnosis not present

## 2015-10-04 ENCOUNTER — Other Ambulatory Visit: Payer: Self-pay | Admitting: *Deleted

## 2015-10-04 DIAGNOSIS — I10 Essential (primary) hypertension: Secondary | ICD-10-CM

## 2015-10-05 MED ORDER — METOPROLOL SUCCINATE ER 50 MG PO TB24: ORAL_TABLET | ORAL | Status: DC

## 2015-10-05 MED FILL — METOPROLOL SUCC ER 50 MG TA: 50 | 90 days supply | Qty: 180 | Fill #0

## 2015-10-12 MED FILL — sulfaSALAzine 500 MG TBEC: 500 | 30 days supply | Qty: 120 | Fill #0

## 2015-10-24 DIAGNOSIS — M9903 Segmental and somatic dysfunction of lumbar region: Secondary | ICD-10-CM | POA: Diagnosis not present

## 2015-10-24 DIAGNOSIS — M5431 Sciatica, right side: Secondary | ICD-10-CM | POA: Diagnosis not present

## 2015-10-26 ENCOUNTER — Ambulatory Visit: Payer: 59 | Admitting: Podiatry

## 2015-10-26 DIAGNOSIS — M5431 Sciatica, right side: Secondary | ICD-10-CM | POA: Diagnosis not present

## 2015-10-26 DIAGNOSIS — M9903 Segmental and somatic dysfunction of lumbar region: Secondary | ICD-10-CM | POA: Diagnosis not present

## 2015-10-30 DIAGNOSIS — M5431 Sciatica, right side: Secondary | ICD-10-CM | POA: Diagnosis not present

## 2015-10-30 DIAGNOSIS — M9903 Segmental and somatic dysfunction of lumbar region: Secondary | ICD-10-CM | POA: Diagnosis not present

## 2015-11-01 DIAGNOSIS — M5431 Sciatica, right side: Secondary | ICD-10-CM | POA: Diagnosis not present

## 2015-11-01 DIAGNOSIS — M9903 Segmental and somatic dysfunction of lumbar region: Secondary | ICD-10-CM | POA: Diagnosis not present

## 2015-11-06 DIAGNOSIS — M5431 Sciatica, right side: Secondary | ICD-10-CM | POA: Diagnosis not present

## 2015-11-06 DIAGNOSIS — M9903 Segmental and somatic dysfunction of lumbar region: Secondary | ICD-10-CM | POA: Diagnosis not present

## 2015-11-08 DIAGNOSIS — M5431 Sciatica, right side: Secondary | ICD-10-CM | POA: Diagnosis not present

## 2015-11-08 DIAGNOSIS — M9903 Segmental and somatic dysfunction of lumbar region: Secondary | ICD-10-CM | POA: Diagnosis not present

## 2015-11-13 DIAGNOSIS — M5431 Sciatica, right side: Secondary | ICD-10-CM | POA: Diagnosis not present

## 2015-11-13 DIAGNOSIS — M9903 Segmental and somatic dysfunction of lumbar region: Secondary | ICD-10-CM | POA: Diagnosis not present

## 2015-11-15 MED FILL — sulfaSALAzine 500 MG TBEC: 500 | 30 days supply | Qty: 120 | Fill #1

## 2015-11-16 MED FILL — GABAPENTIN 100 MG CAPSULE: 100 | 90 days supply | Qty: 360 | Fill #0

## 2015-11-20 DIAGNOSIS — M9903 Segmental and somatic dysfunction of lumbar region: Secondary | ICD-10-CM | POA: Diagnosis not present

## 2015-11-20 DIAGNOSIS — M5431 Sciatica, right side: Secondary | ICD-10-CM | POA: Diagnosis not present

## 2015-11-23 ENCOUNTER — Other Ambulatory Visit: Payer: Self-pay | Admitting: *Deleted

## 2015-11-23 NOTE — Telephone Encounter (Signed)
Patient has appt for physical on 4/20.

## 2015-11-24 MED ORDER — LOSARTAN POTASSIUM-HCTZ 100-25 MG PO TABS: 1.0000 | ORAL_TABLET | Freq: Every day | ORAL | Status: DC

## 2015-11-24 MED FILL — LOSARTAN-HCTZ 100-25 MG TAB: 100-25 | 90 days supply | Qty: 90 | Fill #0

## 2015-12-05 DIAGNOSIS — H6121 Impacted cerumen, right ear: Secondary | ICD-10-CM | POA: Diagnosis not present

## 2015-12-05 DIAGNOSIS — J040 Acute laryngitis: Secondary | ICD-10-CM | POA: Diagnosis not present

## 2015-12-05 DIAGNOSIS — J3081 Allergic rhinitis due to animal (cat) (dog) hair and dander: Secondary | ICD-10-CM | POA: Diagnosis not present

## 2015-12-05 DIAGNOSIS — E040 Nontoxic diffuse goiter: Secondary | ICD-10-CM | POA: Diagnosis not present

## 2015-12-05 DIAGNOSIS — J320 Chronic maxillary sinusitis: Secondary | ICD-10-CM | POA: Diagnosis not present

## 2015-12-05 DIAGNOSIS — J301 Allergic rhinitis due to pollen: Secondary | ICD-10-CM | POA: Diagnosis not present

## 2015-12-05 MED FILL — FLUCONAZOLE 150 MG TABLET: 150 | 10 days supply | Qty: 3 | Fill #0

## 2015-12-05 MED FILL — CEFUROXIME AXETIL 250 MG TA: 250 | 10 days supply | Qty: 20 | Fill #0

## 2015-12-06 ENCOUNTER — Other Ambulatory Visit (HOSPITAL_COMMUNITY): Payer: Self-pay | Admitting: Otolaryngology

## 2015-12-06 DIAGNOSIS — R220 Localized swelling, mass and lump, head: Secondary | ICD-10-CM

## 2015-12-07 ENCOUNTER — Encounter: Payer: 59 | Admitting: Family Medicine

## 2015-12-08 ENCOUNTER — Ambulatory Visit (HOSPITAL_COMMUNITY)
Admission: RE | Admit: 2015-12-08 | Discharge: 2015-12-08 | Disposition: A | Discharge status: Home / Self Care | Payer: 59 | Source: Ambulatory Visit | Attending: Otolaryngology | Admitting: Otolaryngology

## 2015-12-08 DIAGNOSIS — R221 Localized swelling, mass and lump, neck: Secondary | ICD-10-CM | POA: Diagnosis not present

## 2015-12-08 DIAGNOSIS — Z79899 Other long term (current) drug therapy: Secondary | ICD-10-CM | POA: Diagnosis not present

## 2015-12-08 DIAGNOSIS — R220 Localized swelling, mass and lump, head: Secondary | ICD-10-CM | POA: Insufficient documentation

## 2015-12-13 MED FILL — sulfaSALAzine 500 MG TBEC: 500 | 30 days supply | Qty: 120 | Fill #2

## 2015-12-13 MED FILL — HYDROXYCHLOROQUINE 200 MG T: 200 | 84 days supply | Qty: 120 | Fill #1

## 2015-12-18 ENCOUNTER — Other Ambulatory Visit: Payer: Self-pay | Admitting: Radiology

## 2015-12-19 ENCOUNTER — Other Ambulatory Visit: Payer: Self-pay | Admitting: Radiology

## 2015-12-20 ENCOUNTER — Ambulatory Visit (HOSPITAL_COMMUNITY)
Admission: RE | Admit: 2015-12-20 | Discharge: 2015-12-20 | Disposition: A | Discharge status: Home / Self Care | Payer: 59 | Source: Ambulatory Visit | Attending: Otolaryngology | Admitting: Otolaryngology

## 2015-12-20 DIAGNOSIS — Z8249 Family history of ischemic heart disease and other diseases of the circulatory system: Secondary | ICD-10-CM | POA: Diagnosis not present

## 2015-12-20 DIAGNOSIS — K219 Gastro-esophageal reflux disease without esophagitis: Secondary | ICD-10-CM | POA: Insufficient documentation

## 2015-12-20 DIAGNOSIS — Z79899 Other long term (current) drug therapy: Secondary | ICD-10-CM | POA: Diagnosis not present

## 2015-12-20 DIAGNOSIS — M8580 Other specified disorders of bone density and structure, unspecified site: Secondary | ICD-10-CM | POA: Insufficient documentation

## 2015-12-20 DIAGNOSIS — Z8349 Family history of other endocrine, nutritional and metabolic diseases: Secondary | ICD-10-CM | POA: Diagnosis not present

## 2015-12-20 DIAGNOSIS — R590 Localized enlarged lymph nodes: Secondary | ICD-10-CM | POA: Diagnosis present

## 2015-12-20 DIAGNOSIS — M069 Rheumatoid arthritis, unspecified: Secondary | ICD-10-CM | POA: Diagnosis not present

## 2015-12-20 DIAGNOSIS — Z87891 Personal history of nicotine dependence: Secondary | ICD-10-CM | POA: Insufficient documentation

## 2015-12-20 DIAGNOSIS — Z7982 Long term (current) use of aspirin: Secondary | ICD-10-CM | POA: Insufficient documentation

## 2015-12-20 DIAGNOSIS — I10 Essential (primary) hypertension: Secondary | ICD-10-CM | POA: Insufficient documentation

## 2015-12-20 DIAGNOSIS — Z823 Family history of stroke: Secondary | ICD-10-CM | POA: Insufficient documentation

## 2015-12-20 DIAGNOSIS — R599 Enlarged lymph nodes, unspecified: Secondary | ICD-10-CM | POA: Diagnosis not present

## 2015-12-20 DIAGNOSIS — R220 Localized swelling, mass and lump, head: Secondary | ICD-10-CM

## 2015-12-20 LAB — CBC
HCT: 41.5 % (ref 36.0–46.0)
Hemoglobin: 14.4 g/dL (ref 12.0–15.0)
MCH: 28.5 pg (ref 26.0–34.0)
MCHC: 34.7 g/dL (ref 30.0–36.0)
MCV: 82 fL (ref 78.0–100.0)
PLATELETS: 211 10*3/uL (ref 150–400)
RBC: 5.06 MIL/uL (ref 3.87–5.11)
RDW: 12.7 % (ref 11.5–15.5)
WBC: 5.2 10*3/uL (ref 4.0–10.5)

## 2015-12-20 LAB — PROTIME-INR
INR: 0.99 (ref 0.00–1.49)
Prothrombin Time: 13.3 seconds (ref 11.6–15.2)

## 2015-12-20 LAB — APTT: APTT: 26 s (ref 24–37)

## 2015-12-20 MED ORDER — SODIUM CHLORIDE 0.9 % IV SOLN: Freq: Once | INTRAVENOUS | Status: DC

## 2015-12-20 MED ORDER — LIDOCAINE HCL (PF) 1 % IJ SOLN
INTRAMUSCULAR | Status: AC
  Filled 2015-12-20: qty 10

## 2015-12-20 NOTE — Procedures (Signed)
Successful LT cervical submandibular LN core bx No comp Stable EBL 0 Full report in PACS

## 2015-12-20 NOTE — H&P (Signed)
Chief Complaint: Enlarged submandibular lymph node  Referring Physician(s): Crossley,James J  Supervising Physician: Daryll Brod  Patient Status: In-pt / Out-pt  History of Present Illness: [name] is a 65 y.o. female who states she has had a "hard marble" under her chin for a few months now.  She had a CT scan in and [place] in October 2016 which showed a round hypoechoic structure measuring 1.3 x 1.2 x 1.5 cm  She states she has been on a couple of rounds of antibiotics as her provider felt it could be infectious or inflammatory response.  Repeat US done 12/08/2015 showed a smaller similar-appearing 7 x 7 x 6 mm hypoechoic nodule, as well as an additional submandibular lymph node 16 x 8 x 15 mm containing a 7 mm low-attenuation pathologic appearing nodular region.  We are asked to biopsy this area today.  She feels well today and denies any fever/chills.  She is NPO.  She is not taking blood thinners.  Past Medical History  Diagnosis Date  . Hypertension   . Rheumatoid arthritis(714.0) dx 2010  . Pseudogout of foot   . GERD (gastroesophageal reflux disease)   . Allergy   . Cataract     BILATERAL-REMOVED 2 YEARS AGO  . Hx of colonic polyp - ssp 11/03/2014  . 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 Osteopenia 10/17/2011    DEXA 09/2007: -1.4 L fem; 10/2011: -1.2 L fem     Past Surgical History  Procedure Laterality Date  . Breast biopsy  1972  . Broken wrist  2010  . Pneumonia  2007  . Inner ear surgery      busted ear drum  . Cataract extraction  03/2012    left  . Cosmetic surgery    . Fracture surgery    . Colonoscopy      Allergies: Review of patient's allergies indicates no known allergies.  Medications: Prior to Admission medications   Medication Sig Start Date End Date Taking? Authorizing Provider  aspirin 81 MG tablet Take 81 mg by mouth daily.   Yes Historical Provider, MD  B Complex-C (B-COMPLEX WITH VITAMIN C) tablet Take 2 tablets by mouth  daily.   Yes Historical Provider, MD  cholecalciferol (VITAMIN D) 1000 units tablet Take 1,000 Units by mouth daily.   Yes Historical Provider, MD  diclofenac sodium (VOLTAREN) 1 % GEL Apply 4 g topically 4 (four) times daily.   Yes Historical Provider, MD  gabapentin (NEURONTIN) 400 MG capsule Take 1 capsule (400 mg total) by mouth 3 (three) times daily. Patient taking differently: Take 400 mg by mouth 2 (two) times daily.  07/21/15  Yes Tamala Fothergill Regal, DPM  guaiFENesin (MUCINEX) 600 MG 12 hr tablet Take 1,200 mg by mouth 2 (two) times daily as needed for cough or to loosen phlegm.    Yes Historical Provider, MD  hydroxychloroquine (PLAQUENIL) 200 MG tablet Take 200 mg by mouth daily.    Yes Historical Provider, MD  Loratadine 10 MG CAPS Take 10 mg by mouth daily as needed (allergies).    Yes Historical Provider, MD  losartan-hydrochlorothiazide (HYZAAR) 100-25 MG tablet Take 1 tablet by mouth daily. 11/24/15  Yes Lucretia Kern, DO  metoprolol succinate (TOPROL-XL) 50 MG 24 hr tablet TAKE 2 TABLETS BY MOUTH ONCE DAILY Patient taking differently: Take 50 mg by mouth 2 (two) times daily.  10/05/15  Yes Lucretia Kern, DO  pyridOXINE (VITAMIN B-6) 100 MG tablet Take 100 mg by mouth daily.  Yes Historical Provider, MD  ranitidine (ZANTAC) 150 MG tablet Take 150 mg by mouth daily as needed for heartburn.    Yes Historical Provider, MD  sulfaSALAzine (AZULFIDINE) 500 MG tablet Take 1,000 mg by mouth 2 (two) times daily.   Yes Historical Provider, MD  thiamine (VITAMIN B-1) 100 MG tablet Take 300 mg by mouth daily.   Yes Historical Provider, MD     Family History  Problem Relation Age of Onset  . Hypertension Father   . Hypertension Sister     x 3  . Hypertension Brother   . Heart attack Father   . Hyperthyroidism Sister     x2, s/p RAI ablation  . Hypothyroidism Brother   . Stroke Mother     Social History   Social History  . Marital Status: Married    Spouse Name: N/A  . Number of Children:  3  . Years of Education: N/A   Occupational History  . Retired    Social History Main Topics  . Smoking status: Former Smoker    Types: Cigarettes  . Smokeless tobacco: Former Systems developer    Quit date: 08/19/1981  . Alcohol Use: No  . Drug Use: No  . Sexual Activity: Not on file   Other Topics Concern  . Not on file   Social History Narrative   Artist -retired Building control surveyor   Married, lives with spouse, [name], he is IT support for [employer]   3 sons   2 caffeinated beverages a day   No regular exercise, diet is ok      Review of Systems: A 12 point ROS discussed  Review of Systems  Constitutional: Negative.   HENT:       Palpable nodule left submandibular area  Respiratory: Negative.   Cardiovascular: Negative.   Gastrointestinal: Negative.   Genitourinary: Negative.   Musculoskeletal: Negative.   Skin: Negative.   Neurological: Negative.   Hematological: Negative.   Psychiatric/Behavioral: Negative.     Vital Signs: BP 151/71 mmHg  Pulse 66  Temp(Src) 98.5 F (36.9 C) (Oral)  Resp 16  Ht 5\' 6"  (1.676 m)  Wt 231 lb (104.781 kg)  BMI 37.30 kg/m2  SpO2 100%  Physical Exam  Constitutional: She is oriented to person, place, and time. She appears well-developed and well-nourished.  HENT:  Head: Normocephalic and atraumatic.  Eyes: EOM are normal.  Neck:  Palpable nodule left submandibular area  Cardiovascular: Normal rate, regular rhythm and normal heart sounds.   Pulmonary/Chest: Effort normal and breath sounds normal. No respiratory distress.  Abdominal: Soft. She exhibits no distension. There is no tenderness.  Musculoskeletal: Normal range of motion.  Neurological: She is alert and oriented to person, place, and time.  Skin: Skin is warm and dry.  Psychiatric: She has a normal mood and affect. Her behavior is normal. Judgment and thought content normal.  Vitals reviewed.   Mallampati Score:  MD Evaluation Airway: WNL Heart:  WNL Abdomen: WNL Chest/ Lungs: WNL ASA  Classification: 2 Mallampati/Airway Score: Two  Imaging: US Soft Tissue Head/neck  12/08/2015  CLINICAL DATA:  Left submandibular mass EXAM: ULTRASOUND OF HEAD/NECK SOFT TISSUES TECHNIQUE: Ultrasound examination of the head and neck soft tissues was performed in the area of clinical concern. COMPARISON:  CT 06/06/2015 and previous studies FINDINGS: Hypoechoic 12 x 8 x 12 mm sharply marginated nodule in the left submandibular region corresponding to palpable area, slightly decreased in size previously 13 x 12 x 15. This demonstrates internal  blood flow signal on color Doppler. There is a smaller similar-appearing 7 x 7 x 6 mm hypoechoic nodule, as well as an additional submandibular lymph node 16 x 8 x 15 mm containing a 7 mm low-attenuation pathologic appearing nodular region. IMPRESSION: 1. Slight decrease in size of left submandibular pathologic appearing adenopathy Electronically Signed   By: Lucrezia Europe M.D.   On: 12/08/2015 14:07    Labs:  CBC:  Recent Labs  04/21/15 05/30/15 1627  WBC 5.6 7.2  HGB 14.5 14.5  HCT 41 43.3  PLT 229 240.0    COAGS: No results for input(s): INR, APTT in the last 8760 hours.  BMP:  Recent Labs  04/21/15 06/05/15 1403  NA 138 137  K 4.2 3.4*  CL  --  100  CO2  --  29  GLUCOSE  --  99  BUN 13 10  CALCIUM  --  10.4  CREATININE 0.8 0.86    LIVER FUNCTION TESTS:  Recent Labs  04/21/15  AST 16  ALT 17  ALKPHOS 71    TUMOR MARKERS: No results for input(s): AFPTM, CEA, CA199, CHROMGRNA in the last 8760 hours.  Assessment and Plan:  A 7 x 7 x 6 mm hypoechoic nodule, as well as an additional submandibular lymph node 16 x 8 x 15 mm   Will proceed with US guided biopsy today by Dr. Annamaria Boots.  Risks and Benefits discussed with the patient including, but not limited to bleeding, infection, damage to adjacent structures or low yield requiring additional tests.  All of the patient's questions were  answered, patient is agreeable to proceed. Consent signed and in chart.  Thank you for this interesting consult.  I greatly enjoyed meeting DOSHA NARAMORE and look forward to participating in their care.  A copy of this report was sent to the requesting provider on this date.  Electronically Signed: Murrell Redden PA-C 12/20/2015, 12:46 PM   I spent a total of  30 Minutes in face to face in clinical consultation, greater than 50% of which was counseling/coordinating care for lymph node biopsy

## 2015-12-22 DIAGNOSIS — M0579 Rheumatoid arthritis with rheumatoid factor of multiple sites without organ or systems involvement: Secondary | ICD-10-CM | POA: Diagnosis not present

## 2015-12-22 DIAGNOSIS — Z09 Encounter for follow-up examination after completed treatment for conditions other than malignant neoplasm: Secondary | ICD-10-CM | POA: Diagnosis not present

## 2015-12-22 DIAGNOSIS — M461 Sacroiliitis, not elsewhere classified: Secondary | ICD-10-CM | POA: Diagnosis not present

## 2015-12-22 DIAGNOSIS — M06251 Rheumatoid bursitis, right hip: Secondary | ICD-10-CM | POA: Diagnosis not present

## 2015-12-27 DIAGNOSIS — E040 Nontoxic diffuse goiter: Secondary | ICD-10-CM | POA: Diagnosis not present

## 2015-12-28 ENCOUNTER — Other Ambulatory Visit: Payer: Self-pay | Admitting: Family Medicine

## 2015-12-28 MED FILL — METOPROLOL SUCC ER 50 MG TA: 50 | 90 days supply | Qty: 180 | Fill #0

## 2015-12-28 MED FILL — DICLOFENAC SODIUM 1% GEL: 1 | 11 days supply | Qty: 300 | Fill #0

## 2016-01-09 DIAGNOSIS — Z09 Encounter for follow-up examination after completed treatment for conditions other than malignant neoplasm: Secondary | ICD-10-CM | POA: Diagnosis not present

## 2016-01-09 DIAGNOSIS — M7071 Other bursitis of hip, right hip: Secondary | ICD-10-CM | POA: Diagnosis not present

## 2016-01-09 DIAGNOSIS — M0579 Rheumatoid arthritis with rheumatoid factor of multiple sites without organ or systems involvement: Secondary | ICD-10-CM | POA: Diagnosis not present

## 2016-01-17 MED FILL — sulfaSALAzine 500 MG TBEC: 500 | 90 days supply | Qty: 360 | Fill #0

## 2016-02-12 ENCOUNTER — Ambulatory Visit (INDEPENDENT_AMBULATORY_CARE_PROVIDER_SITE_OTHER): Payer: 59 | Admitting: Family Medicine

## 2016-02-12 ENCOUNTER — Encounter: Payer: Self-pay | Admitting: Family Medicine

## 2016-02-12 ENCOUNTER — Ambulatory Visit (INDEPENDENT_AMBULATORY_CARE_PROVIDER_SITE_OTHER)
Admission: RE | Admit: 2016-02-12 | Discharge: 2016-02-12 | Disposition: A | Discharge status: Home / Self Care | Payer: 59 | Source: Ambulatory Visit | Attending: Family Medicine | Admitting: Family Medicine

## 2016-02-12 VITALS — BP 142/80 | HR 74 | Temp 98.3°F | Ht 66.0 in | Wt 233.1 lb

## 2016-02-12 DIAGNOSIS — M5441 Lumbago with sciatica, right side: Secondary | ICD-10-CM

## 2016-02-12 DIAGNOSIS — M47816 Spondylosis without myelopathy or radiculopathy, lumbar region: Secondary | ICD-10-CM | POA: Diagnosis not present

## 2016-02-12 MED ORDER — NAPROXEN SODIUM 220 MG PO TABS: 220.0000 mg | ORAL_TABLET | Freq: Two times a day (BID) | ORAL | Status: DC

## 2016-02-12 NOTE — Progress Notes (Signed)
Pre visit review using our clinic review tool, if applicable. No additional management support is needed unless otherwise documented below in the visit note. 

## 2016-02-12 NOTE — Progress Notes (Signed)
HPI:  [name] is here for an acute visit for:  Back pain: -hx back pain on brief review prior PCP notes -This pain started about 2 months ago after she stepped wrong on a stone and lurched to the right without following -The pain is in the right low back and radiates to the right buttock in time will quickly radiate down the right leg as well -She describes the pain as sharp and constant at a mild-to-moderate level, with severe pain briefly at times with certain movements such as bending over -She reports she saw her rheumatologist for this and was diagnosed with right hip bursitis and was given an injection reports this did not help at all and she was not happy with them coming up with this diagnosis so has not followed up there -Ibuprofen helps with the pain and she takes from time to time, Voltaren does not help, Tylenol does not help -She saw her chiropractor, but this did not help -Denies weakness, numbness, fevers, malaise, bowel or bladder incontinence   Chronic conditions:  Rheumatoid Arthritis/Peripheral Neuropathy: -managed by her [facility] -meds: gabapentin 200mg  bid, plaquenil, sulfasalazine -her neuropathy in her feet is still bad - sees podiatrist  HTN: -asa, losartan-hctz 100-25, toprol xl 50mg  -denies: hx palpitations, hx tachy, CP, SOB  GERD: -takes zantac -no hx stricture, no dysphagia  Seasonal allergies: -take loratidine, musinex -sees Dr. Marcelline Mates - ENT -neck LAD - has not resolved, did not follow up on this  ROS: See pertinent positives and negatives per HPI.  Past Medical History  Diagnosis Date  . Hypertension   . Rheumatoid arthritis(714.0) dx 2010  . Pseudogout of foot   . GERD (gastroesophageal reflux disease)   . Allergy   . Cataract     BILATERAL-REMOVED 2 YEARS AGO  . Hx of colonic polyp - ssp 11/03/2014  . 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 Osteopenia 10/17/2011    DEXA 09/2007: -1.4 L fem; 10/2011: -1.2 L fem      Past Surgical History  Procedure Laterality Date  . Breast biopsy  1972  . Broken wrist  2010  . Pneumonia  2007  . Inner ear surgery      busted ear drum  . Cataract extraction  03/2012    left  . Cosmetic surgery    . Fracture surgery    . Colonoscopy      Family History  Problem Relation Age of Onset  . Hypertension Father   . Hypertension Sister     x 3  . Hypertension Brother   . Heart attack Father   . Hyperthyroidism Sister     x2, s/p RAI ablation  . Hypothyroidism Brother   . Stroke Mother     Social History   Social History  . Marital Status: Married    Spouse Name: N/A  . Number of Children: 3  . Years of Education: N/A   Occupational History  . Retired    Social History Main Topics  . Smoking status: Former Smoker    Types: Cigarettes  . Smokeless tobacco: Former Systems developer    Quit date: 08/19/1981  . Alcohol Use: No  . Drug Use: No  . Sexual Activity: Not Asked   Other Topics Concern  . None   Social History Narrative   Artist -retired Building control surveyor   Married, lives with spouse, [name], he is IT support for [employer]   3 sons   2 caffeinated beverages a day  No regular exercise, diet is ok     Current outpatient prescriptions:  .  aspirin 81 MG tablet, Take 81 mg by mouth daily., Disp: , Rfl:  .  B Complex-C (B-COMPLEX WITH VITAMIN C) tablet, Take 2 tablets by mouth daily., Disp: , Rfl:  .  BIOTIN PO, Take 5,000 mg by mouth daily., Disp: , Rfl:  .  cholecalciferol (VITAMIN D) 1000 units tablet, Take 1,000 Units by mouth daily., Disp: , Rfl:  .  Cyanocobalamin (VITAMIN B-12 PO), Take 2,500 mg by mouth daily., Disp: , Rfl:  .  gabapentin (NEURONTIN) 400 MG capsule, Take 1 capsule (400 mg total) by mouth 3 (three) times daily. (Patient taking differently: Take 100 mg by mouth 2 (two) times daily. Takes 200mg  every morning and 100mg  at bedtime), Disp: 90 capsule, Rfl: 3 .  guaiFENesin (MUCINEX) 600 MG 12 hr tablet, Take  1,200 mg by mouth 2 (two) times daily as needed for cough or to loosen phlegm. , Disp: , Rfl:  .  hydroxychloroquine (PLAQUENIL) 200 MG tablet, Take 200 mg by mouth daily. , Disp: , Rfl:  .  Loratadine 10 MG CAPS, Take 10 mg by mouth daily as needed (allergies). , Disp: , Rfl:  .  losartan-hydrochlorothiazide (HYZAAR) 100-25 MG tablet, Take 1 tablet by mouth daily., Disp: 90 tablet, Rfl: 3 .  metoprolol succinate (TOPROL-XL) 50 MG 24 hr tablet, TAKE 2 TABLETS BY MOUTH ONCE DAILY, Disp: 180 tablet, Rfl: 0 .  pyridOXINE (VITAMIN B-6) 100 MG tablet, Take 100 mg by mouth daily., Disp: , Rfl:  .  ranitidine (ZANTAC) 150 MG tablet, Take 150 mg by mouth daily as needed for heartburn. , Disp: , Rfl:  .  sulfaSALAzine (AZULFIDINE) 500 MG tablet, Take 1,000 mg by mouth 2 (two) times daily., Disp: , Rfl:  .  thiamine (VITAMIN B-1) 100 MG tablet, Take 300 mg by mouth daily., Disp: , Rfl:  .  naproxen sodium (ALEVE) 220 MG tablet, Take 1 tablet (220 mg total) by mouth 2 (two) times daily with a meal., Disp: , Rfl:   EXAM:  Filed Vitals:   02/12/16 1258  BP: 142/80  Pulse: 74  Temp: 98.3 F (36.8 C)    Body mass index is 37.64 kg/(m^2).  GENERAL: vitals reviewed and listed above, alert, oriented, appears well hydrated and in no acute distress  HEENT: atraumatic, conjunttiva clear, no obvious abnormalities on inspection of external nose and ears  NECK: no obvious masses on inspection  LUNGS: clear to auscultation bilaterally, no wheezes, rales or rhonchi, good air movement  CV: HRRR, no peripheral edema  MS: moves all extremities without noticeable abnormality Normal Gait Normal inspection of back, no obvious scoliosis or leg length descrepancy No bony TTP Soft tissue TTP at: Right SI joint, right buttock, right greater trochanter and IT band -/+ tests: neg trendelenburg,-facet loading, -SLRT, -CLRT, -FABER, -FADIR Normal muscle strength, sensation to light touch and DTRs in LEs  bilaterally  PSYCH: pleasant and cooperative, no obvious depression or anxiety  ASSESSMENT AND PLAN:  Discussed the following assessment and plan:  Right-sided low back pain with right-sided sciatica - Plan: DG Lumbar Spine Complete  -Symptoms suggest possible radicular pain  -Exam without significant focal findings and multiple areas of tenderness  -we discussed possible serious and likely etiologies, workup and treatment, treatment risks and return precautions -after this discussion, Maritsa opted for start with plain films of the back, home exercise program, cautious use of NSAIDs and other over-the-counter options for pain  for now with close follow-up -May need  further evaluation with MRI and/or ortho symptoms persist -of course, we advised Sabra  to return or notify a doctor immediately if symptoms worsen or persist or new concerns arise.   Patient Instructions  BEFORE YOU LEAVE: -xray sheet -low back exercises -follow up in 1 month  Go get the xray  Topical sports creams with capsaicin or menthol along with Aleve not more then twice daily per instructions as needed for pain.  Heat 15 minutes twice daily.  Do the exercises provided 4 days per week.     Colin Benton R.

## 2016-02-12 NOTE — Patient Instructions (Addendum)
BEFORE YOU LEAVE: -xray sheet -low back exercises -follow up in 1 month  Go get the xray  Topical sports creams with capsaicin or menthol along with Aleve not more then twice daily per instructions as needed for pain.  Heat 15 minutes twice daily.  Do the exercises provided 4 days per week.

## 2016-02-22 MED FILL — LOSARTAN-HCTZ 100-25 MG TAB: 100-25 | 90 days supply | Qty: 90 | Fill #1

## 2016-03-11 DIAGNOSIS — M25551 Pain in right hip: Secondary | ICD-10-CM | POA: Diagnosis not present

## 2016-03-11 DIAGNOSIS — M545 Low back pain: Secondary | ICD-10-CM | POA: Diagnosis not present

## 2016-03-13 ENCOUNTER — Other Ambulatory Visit: Payer: Self-pay | Admitting: Orthopaedic Surgery

## 2016-03-13 DIAGNOSIS — G8929 Other chronic pain: Secondary | ICD-10-CM

## 2016-03-13 DIAGNOSIS — M549 Dorsalgia, unspecified: Principal | ICD-10-CM

## 2016-03-13 DIAGNOSIS — M5416 Radiculopathy, lumbar region: Secondary | ICD-10-CM

## 2016-03-14 MED FILL — GABAPENTIN 100 MG CAPSULE: 100 | 90 days supply | Qty: 360 | Fill #0

## 2016-03-18 ENCOUNTER — Ambulatory Visit: Payer: 59 | Admitting: Family Medicine

## 2016-03-19 ENCOUNTER — Ambulatory Visit
Admission: RE | Admit: 2016-03-19 | Discharge: 2016-03-19 | Disposition: A | Discharge status: Home / Self Care | Payer: 59 | Source: Ambulatory Visit | Attending: Orthopaedic Surgery | Admitting: Orthopaedic Surgery

## 2016-03-19 DIAGNOSIS — M5416 Radiculopathy, lumbar region: Secondary | ICD-10-CM

## 2016-03-19 DIAGNOSIS — M4806 Spinal stenosis, lumbar region: Secondary | ICD-10-CM | POA: Diagnosis not present

## 2016-03-19 DIAGNOSIS — G8929 Other chronic pain: Secondary | ICD-10-CM

## 2016-03-19 DIAGNOSIS — M549 Dorsalgia, unspecified: Principal | ICD-10-CM

## 2016-03-25 NOTE — Progress Notes (Deleted)
HPI:  Chronic back pain: -referred to ortho -ortho did MRI and she is here today to follow up on MRI results? -sees Dr. Estanislado Pandy for her hx rheumatoid arthritis  ROS: See pertinent positives and negatives per HPI.  Past Medical History:  Diagnosis Date  . Allergy   . Cataract    BILATERAL-REMOVED 2 YEARS AGO  . GERD (gastroesophageal reflux disease)   . Hx of colonic polyp - ssp 11/03/2014  . Hypertension   . 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 Osteopenia 10/17/2011   DEXA 09/2007: -1.4 L fem; 10/2011: -1.2 L fem   . Pseudogout of foot   . Rheumatoid arthritis(714.0) dx 2010    Past Surgical History:  Procedure Laterality Date  . BREAST BIOPSY  1972  . Broken wrist  2010  . CATARACT EXTRACTION  03/2012   left  . COLONOSCOPY    . COSMETIC SURGERY    . FRACTURE SURGERY    . INNER EAR SURGERY     busted ear drum  . pneumonia  2007    Family History  Problem Relation Age of Onset  . Hypertension Father   . Hypertension Sister     x 3  . Hypertension Brother   . Heart attack Father   . Hyperthyroidism Sister     x2, s/p RAI ablation  . Hypothyroidism Brother   . Stroke Mother     Social History   Social History  . Marital status: Married    Spouse name: N/A  . Number of children: 3  . Years of education: N/A   Occupational History  . Retired    Social History Main Topics  . Smoking status: Former Smoker    Types: Cigarettes  . Smokeless tobacco: Former Systems developer    Quit date: 08/19/1981  . Alcohol use No  . Drug use: No  . Sexual activity: Not on file   Other Topics Concern  . Not on file   Social History Narrative   Artist -retired Building control surveyor   Married, lives with spouse, [name], he is IT support for [employer]   3 sons   2 caffeinated beverages a day   No regular exercise, diet is ok     Current Outpatient Prescriptions:  .  aspirin 81 MG tablet, Take 81 mg by mouth daily., Disp: , Rfl:  .  B Complex-C (B-COMPLEX WITH  VITAMIN C) tablet, Take 2 tablets by mouth daily., Disp: , Rfl:  .  BIOTIN PO, Take 5,000 mg by mouth daily., Disp: , Rfl:  .  cholecalciferol (VITAMIN D) 1000 units tablet, Take 1,000 Units by mouth daily., Disp: , Rfl:  .  Cyanocobalamin (VITAMIN B-12 PO), Take 2,500 mg by mouth daily., Disp: , Rfl:  .  gabapentin (NEURONTIN) 400 MG capsule, Take 1 capsule (400 mg total) by mouth 3 (three) times daily. (Patient taking differently: Take 100 mg by mouth 2 (two) times daily. Takes 200mg  every morning and 100mg  at bedtime), Disp: 90 capsule, Rfl: 3 .  guaiFENesin (MUCINEX) 600 MG 12 hr tablet, Take 1,200 mg by mouth 2 (two) times daily as needed for cough or to loosen phlegm. , Disp: , Rfl:  .  hydroxychloroquine (PLAQUENIL) 200 MG tablet, Take 200 mg by mouth daily. , Disp: , Rfl:  .  Loratadine 10 MG CAPS, Take 10 mg by mouth daily as needed (allergies). , Disp: , Rfl:  .  losartan-hydrochlorothiazide (HYZAAR) 100-25 MG tablet, Take 1 tablet by mouth daily., Disp: 90  tablet, Rfl: 3 .  metoprolol succinate (TOPROL-XL) 50 MG 24 hr tablet, TAKE 2 TABLETS BY MOUTH ONCE DAILY, Disp: 180 tablet, Rfl: 0 .  naproxen sodium (ALEVE) 220 MG tablet, Take 1 tablet (220 mg total) by mouth 2 (two) times daily with a meal., Disp: , Rfl:  .  pyridOXINE (VITAMIN B-6) 100 MG tablet, Take 100 mg by mouth daily., Disp: , Rfl:  .  ranitidine (ZANTAC) 150 MG tablet, Take 150 mg by mouth daily as needed for heartburn. , Disp: , Rfl:  .  sulfaSALAzine (AZULFIDINE) 500 MG tablet, Take 1,000 mg by mouth 2 (two) times daily., Disp: , Rfl:  .  thiamine (VITAMIN B-1) 100 MG tablet, Take 300 mg by mouth daily., Disp: , Rfl:   EXAM:  There were no vitals filed for this visit.  There is no height or weight on file to calculate BMI.  GENERAL: vitals reviewed and listed above, alert, oriented, appears well hydrated and in no acute distress  HEENT: atraumatic, conjunttiva clear, no obvious abnormalities on inspection of  external nose and ears  NECK: no obvious masses on inspection  LUNGS: clear to auscultation bilaterally, no wheezes, rales or rhonchi, good air movement  CV: HRRR, no peripheral edema  MS: moves all extremities without noticeable abnormality  PSYCH: pleasant and cooperative, no obvious depression or anxiety  ASSESSMENT AND PLAN:  Discussed the following assessment and plan:  No diagnosis found.  -Patient advised to return or notify a doctor immediately if symptoms worsen or persist or new concerns arise.  There are no Patient Instructions on file for this visit.  Colin Benton R., DO

## 2016-03-26 ENCOUNTER — Telehealth: Payer: Self-pay | Admitting: Family Medicine

## 2016-03-26 ENCOUNTER — Ambulatory Visit: Payer: 59 | Admitting: Family Medicine
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
## 2016-03-29 ENCOUNTER — Other Ambulatory Visit: Payer: Self-pay | Admitting: Family Medicine

## 2016-03-29 MED FILL — METOPROLOL SUCC ER 50 MG TA: 50 | 90 days supply | Qty: 180 | Fill #0

## 2016-04-10 DIAGNOSIS — M545 Low back pain: Secondary | ICD-10-CM | POA: Diagnosis not present

## 2016-04-10 MED FILL — traMADol HCL 50 MG TABS: 50 | 8 days supply | Qty: 45 | Fill #0

## 2016-04-15 DIAGNOSIS — M4306 Spondylolysis, lumbar region: Secondary | ICD-10-CM | POA: Diagnosis not present

## 2016-04-15 DIAGNOSIS — M5416 Radiculopathy, lumbar region: Secondary | ICD-10-CM | POA: Diagnosis not present

## 2016-04-15 DIAGNOSIS — M4316 Spondylolisthesis, lumbar region: Secondary | ICD-10-CM | POA: Diagnosis not present

## 2016-04-15 MED FILL — HYDROXYCHLOROQUINE 200 MG T: 200 | 84 days supply | Qty: 120 | Fill #0

## 2016-04-18 MED FILL — sulfaSALAzine 500 MG TBEC: 500 | 90 days supply | Qty: 360 | Fill #1

## 2016-05-01 MED FILL — traMADol HCL 50 MG TABS: 50 | 8 days supply | Qty: 45 | Fill #0

## 2016-05-06 DIAGNOSIS — M4316 Spondylolisthesis, lumbar region: Secondary | ICD-10-CM | POA: Diagnosis not present

## 2016-05-13 MED FILL — traMADol HCL 50 MG TABS: 50 | 7 days supply | Qty: 45 | Fill #0

## 2016-05-16 MED FILL — LOSARTAN-HCTZ 100-25 MG TAB: 100-25 | 90 days supply | Qty: 90 | Fill #2

## 2016-05-22 ENCOUNTER — Ambulatory Visit (INDEPENDENT_AMBULATORY_CARE_PROVIDER_SITE_OTHER): Payer: 59 | Admitting: Orthopaedic Surgery

## 2016-05-23 DIAGNOSIS — M9904 Segmental and somatic dysfunction of sacral region: Secondary | ICD-10-CM | POA: Diagnosis not present

## 2016-05-23 DIAGNOSIS — M9905 Segmental and somatic dysfunction of pelvic region: Secondary | ICD-10-CM | POA: Diagnosis not present

## 2016-05-23 DIAGNOSIS — M5136 Other intervertebral disc degeneration, lumbar region: Secondary | ICD-10-CM | POA: Diagnosis not present

## 2016-05-23 DIAGNOSIS — M9903 Segmental and somatic dysfunction of lumbar region: Secondary | ICD-10-CM | POA: Diagnosis not present

## 2016-05-24 DIAGNOSIS — M5136 Other intervertebral disc degeneration, lumbar region: Secondary | ICD-10-CM | POA: Diagnosis not present

## 2016-05-24 DIAGNOSIS — M9903 Segmental and somatic dysfunction of lumbar region: Secondary | ICD-10-CM | POA: Diagnosis not present

## 2016-05-24 DIAGNOSIS — M9904 Segmental and somatic dysfunction of sacral region: Secondary | ICD-10-CM | POA: Diagnosis not present

## 2016-05-24 DIAGNOSIS — M9905 Segmental and somatic dysfunction of pelvic region: Secondary | ICD-10-CM | POA: Diagnosis not present

## 2016-05-27 DIAGNOSIS — M9904 Segmental and somatic dysfunction of sacral region: Secondary | ICD-10-CM | POA: Diagnosis not present

## 2016-05-27 DIAGNOSIS — M5136 Other intervertebral disc degeneration, lumbar region: Secondary | ICD-10-CM | POA: Diagnosis not present

## 2016-05-27 DIAGNOSIS — M9903 Segmental and somatic dysfunction of lumbar region: Secondary | ICD-10-CM | POA: Diagnosis not present

## 2016-05-27 DIAGNOSIS — M9905 Segmental and somatic dysfunction of pelvic region: Secondary | ICD-10-CM | POA: Diagnosis not present

## 2016-05-29 DIAGNOSIS — M9905 Segmental and somatic dysfunction of pelvic region: Secondary | ICD-10-CM | POA: Diagnosis not present

## 2016-05-29 DIAGNOSIS — M9904 Segmental and somatic dysfunction of sacral region: Secondary | ICD-10-CM | POA: Diagnosis not present

## 2016-05-29 DIAGNOSIS — M9903 Segmental and somatic dysfunction of lumbar region: Secondary | ICD-10-CM | POA: Diagnosis not present

## 2016-05-29 DIAGNOSIS — M5136 Other intervertebral disc degeneration, lumbar region: Secondary | ICD-10-CM | POA: Diagnosis not present

## 2016-05-30 DIAGNOSIS — M5417 Radiculopathy, lumbosacral region: Secondary | ICD-10-CM | POA: Insufficient documentation

## 2016-05-30 NOTE — Progress Notes (Signed)
HPI:  [name] is a very pleasant 65. She recently transferred her care to me and has not been in much since. She reports she is here today to go over MRI results done with her orthopedic specialist. She has a hx of RA, neuropathy and chronic pain and sees a rheumatologist and now an orthopedic specialist for these issues. She has DDD and had and MRI ordered by her specialist  (Dr. Lorin Mercy) per review of chart on 03/2016 that per radiology report showed DDD with facet arthrosis and possible involvement of R S1. She unfortunately did not have a good experience with Dr. Lorin Mercy. Several spinal injections did not help. She reports he told her she was a big woman and tried to insist that she take tramadol for pain. She then sought care at a chiropractic clinic and feels finally is getting some relief (Dr. Veronda Prude) Her chronic medical problems that I manage include hypertension and GERD. She has not been in for some time to follow up on these issues. No reported CP, SOB, HA, vision changes. She sees Dr. Marcelline Mates in ENT for allergies.  ROS: See pertinent positives and negatives per HPI.  Past Medical History:  Diagnosis Date  . Allergy   . Cataract    BILATERAL-REMOVED 2 YEARS AGO  . GERD (gastroesophageal reflux disease)   . Hx of colonic polyp - ssp 11/03/2014  . Hypertension   . 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 Osteopenia 10/17/2011   DEXA 09/2007: -1.4 L fem; 10/2011: -1.2 L fem   . Pseudogout of foot   . Rheumatoid arthritis(714.0) dx 2010    Past Surgical History:  Procedure Laterality Date  . BREAST BIOPSY  1972  . Broken wrist  2010  . CATARACT EXTRACTION  03/2012   left  . COLONOSCOPY    . COSMETIC SURGERY    . FRACTURE SURGERY    . INNER EAR SURGERY     busted ear drum  . pneumonia  2007    Family History  Problem Relation Age of Onset  . Hypertension Father   . Hypertension Sister     x 3  . Hypertension Brother   . Heart attack Father   . Hyperthyroidism Sister    x2, s/p RAI ablation  . Hypothyroidism Brother   . Stroke Mother     Social History   Social History  . Marital status: Married    Spouse name: N/A  . Number of children: 3  . Years of education: N/A   Occupational History  . Retired    Social History Main Topics  . Smoking status: Former Smoker    Types: Cigarettes  . Smokeless tobacco: Former Systems developer    Quit date: 08/19/1981  . Alcohol use No  . Drug use: No  . Sexual activity: Not Asked   Other Topics Concern  . None   Social History Narrative   Artist -retired Building control surveyor   Married, lives with spouse, [name], he is IT support for [employer]   3 sons   2 caffeinated beverages a day   No regular exercise, diet is ok     Current Outpatient Prescriptions:  .  aspirin 81 MG tablet, Take 81 mg by mouth daily., Disp: , Rfl:  .  B Complex-C (B-COMPLEX WITH VITAMIN C) tablet, Take 2 tablets by mouth daily., Disp: , Rfl:  .  BIOTIN PO, Take 5,000 mg by mouth daily., Disp: , Rfl:  .  cholecalciferol (VITAMIN D) 1000  units tablet, Take 1,000 Units by mouth daily., Disp: , Rfl:  .  Cyanocobalamin (VITAMIN B-12 PO), Take 2,500 mg by mouth daily., Disp: , Rfl:  .  gabapentin (NEURONTIN) 400 MG capsule, Take 1 capsule (400 mg total) by mouth 3 (three) times daily. (Patient taking differently: Take 100 mg by mouth 2 (two) times daily. Takes 200mg  every morning and 100mg  at bedtime), Disp: 90 capsule, Rfl: 3 .  guaiFENesin (MUCINEX) 600 MG 12 hr tablet, Take 1,200 mg by mouth 2 (two) times daily as needed for cough or to loosen phlegm. , Disp: , Rfl:  .  hydroxychloroquine (PLAQUENIL) 200 MG tablet, Take 200 mg by mouth daily. , Disp: , Rfl:  .  Loratadine 10 MG CAPS, Take 10 mg by mouth daily as needed (allergies). , Disp: , Rfl:  .  losartan-hydrochlorothiazide (HYZAAR) 100-25 MG tablet, Take 1 tablet by mouth daily., Disp: 90 tablet, Rfl: 3 .  metoprolol succinate (TOPROL-XL) 50 MG 24 hr tablet, TAKE 2 TABLETS BY  MOUTH ONCE DAILY, Disp: 180 tablet, Rfl: 0 .  naproxen sodium (ALEVE) 220 MG tablet, Take 1 tablet (220 mg total) by mouth 2 (two) times daily with a meal., Disp: , Rfl:  .  pyridOXINE (VITAMIN B-6) 100 MG tablet, Take 100 mg by mouth daily., Disp: , Rfl:  .  ranitidine (ZANTAC) 150 MG tablet, Take 150 mg by mouth daily as needed for heartburn. , Disp: , Rfl:  .  sulfaSALAzine (AZULFIDINE) 500 MG tablet, Take 1,000 mg by mouth 2 (two) times daily., Disp: , Rfl:  .  thiamine (VITAMIN B-1) 100 MG tablet, Take 300 mg by mouth daily., Disp: , Rfl:   EXAM:  Vitals:   05/31/16 0958  BP: 126/80  Pulse: 73  Temp: 98.6 F (37 C)    Body mass index is 36.57 kg/m.  GENERAL: vitals reviewed and listed above, alert, oriented, appears well hydrated and in no acute distress  HEENT: atraumatic, conjunttiva clear, no obvious abnormalities on inspection of external nose and ears  NECK: no obvious masses on inspection  LUNGS: clear to auscultation bilaterally, no wheezes, rales or rhonchi, good air movement  CV: HRRR, no peripheral edema  MS: moves all extremities without noticeable abnormality  PSYCH: pleasant and cooperative, no obvious depression or anxiety  ASSESSMENT AND PLAN:  Discussed the following assessment and plan:  Need for Tdap vaccination - Plan: Tdap vaccine greater than or equal to 7yo IM  Essential hypertension - Plan: Basic metabolic panel, CBC (no diff)  Hyperlipidemia, unspecified hyperlipidemia type - Plan: Lipid Panel  Seasonal allergic rhinitis, unspecified chronicity, unspecified trigger  Rheumatoid arthritis, involving unspecified site, unspecified rheumatoid factor presence (HCC)  L-S radiculopathy  BMI 36.0-36.9,adult  -I am so glad that she is getting some relief from the back pain - this is such a difficult issue to treat, I am glad she is trying to avoid opoids (which are not a good answer for chronic musculoskeletal pain.) -she plans to continue  current treatment for her back and I also advised easing into a core strengthening program and regular gentle aerobic exercise to prevent future flares and work on a healthy diet for wt reduction -advised labs today for HTN management -she is due for a preventive visit and advised her to schedule -advise/offered mammogram and vaccines that are due -Patient advised to return or notify a doctor immediately if symptoms worsen or persist or new concerns arise.  Patient Instructions  BEFORE YOU LEAVE: -low back exercises -flu  shot and Tdap -follow up: schedule physical exam in 3 months  I am so glad that you have found something that is helping the back pain!  Once your chiropractor feels you are ready, please start a core strengthening program and regular aerobic exercise (water aerobics or walking on a flat surface may be best). Ease into these activities gradually but with a goal of 150 minutes of exercise per week minimum.  We have ordered labs or studies at this visit. It can take up to 1-2 weeks for results and processing. IF results require follow up or explanation, we will call you with instructions. Clinically stable results will be released to your [facility]. If you have not heard from [place] or cannot find your results in [facility] in 2 weeks please contact our office at [phone].  If you are not yet signed up for [facility], please consider signing up.   We recommend the following healthy lifestyle for LIFE. I think a healthy, nutrient rich diet may help with the back issues as well. 1) Small portions.   Tip: eat off of a salad plate instead of a dinner plate.  Tip: if you need more or a snack choose fruits, veggies and/or a handful of nuts or seeds.  2) Eat a healthy clean diet.  * Tip: Avoid (less then 1 serving per week): processed foods, sweets, sweetened drinks, white starches (rice, flour, bread, potatoes, pasta, etc), red meat, fast foods, butter  *Tip: CHOOSE instead   * 5-9  servings per day of fresh or frozen fruits and vegetables (but not corn, potatoes, bananas, canned or dried fruit)   *nuts and seeds, beans   *olives and olive oil   *small portions of lean meats such as fish and white chicken    *small portions of whole grains  3)Get at least 150 minutes of sweaty aerobic exercise per week.  4)Reduce stress - consider counseling, meditation and relaxation to balance other aspects of your life.            Colin Benton R., DO

## 2016-05-31 ENCOUNTER — Encounter: Payer: Self-pay | Admitting: Family Medicine

## 2016-05-31 ENCOUNTER — Ambulatory Visit (INDEPENDENT_AMBULATORY_CARE_PROVIDER_SITE_OTHER): Payer: 59 | Admitting: Family Medicine

## 2016-05-31 VITALS — BP 126/80 | HR 73 | Temp 98.6°F | Wt 226.6 lb

## 2016-05-31 DIAGNOSIS — J302 Other seasonal allergic rhinitis: Secondary | ICD-10-CM

## 2016-05-31 DIAGNOSIS — I10 Essential (primary) hypertension: Secondary | ICD-10-CM | POA: Diagnosis not present

## 2016-05-31 DIAGNOSIS — M5136 Other intervertebral disc degeneration, lumbar region: Secondary | ICD-10-CM | POA: Diagnosis not present

## 2016-05-31 DIAGNOSIS — M9904 Segmental and somatic dysfunction of sacral region: Secondary | ICD-10-CM | POA: Diagnosis not present

## 2016-05-31 DIAGNOSIS — M069 Rheumatoid arthritis, unspecified: Secondary | ICD-10-CM

## 2016-05-31 DIAGNOSIS — Z23 Encounter for immunization: Secondary | ICD-10-CM

## 2016-05-31 DIAGNOSIS — M5417 Radiculopathy, lumbosacral region: Secondary | ICD-10-CM

## 2016-05-31 DIAGNOSIS — E785 Hyperlipidemia, unspecified: Secondary | ICD-10-CM | POA: Diagnosis not present

## 2016-05-31 DIAGNOSIS — M9905 Segmental and somatic dysfunction of pelvic region: Secondary | ICD-10-CM | POA: Diagnosis not present

## 2016-05-31 DIAGNOSIS — M9903 Segmental and somatic dysfunction of lumbar region: Secondary | ICD-10-CM | POA: Diagnosis not present

## 2016-05-31 DIAGNOSIS — Z6836 Body mass index (BMI) 36.0-36.9, adult: Secondary | ICD-10-CM

## 2016-05-31 LAB — BASIC METABOLIC PANEL
BUN: 14 mg/dL (ref 6–23)
CALCIUM: 10.8 mg/dL — AB (ref 8.4–10.5)
CO2: 33 mEq/L — ABNORMAL HIGH (ref 19–32)
CREATININE: 0.9 mg/dL (ref 0.40–1.20)
Chloride: 98 mEq/L (ref 96–112)
GFR: 66.3 mL/min (ref >60.00)
Glucose, Bld: 100 mg/dL — ABNORMAL HIGH (ref 70–99)
Potassium: 4.1 mEq/L (ref 3.5–5.1)
Sodium: 136 mEq/L (ref 135–145)

## 2016-05-31 LAB — CBC
HCT: 41.6 % (ref 36.0–46.0)
Hemoglobin: 14.2 g/dL (ref 12.0–15.0)
MCHC: 34 g/dL (ref 30.0–36.0)
MCV: 86.5 fl (ref 78.0–100.0)
Platelets: 235 10*3/uL (ref 150.0–400.0)
RBC: 4.81 Mil/uL (ref 3.87–5.11)
RDW: 13.3 % (ref 11.5–15.5)
WBC: 5.7 10*3/uL (ref 4.0–10.5)

## 2016-05-31 LAB — LIPID PANEL
CHOLESTEROL: 223 mg/dL — AB (ref 0–200)
HDL: 56.3 mg/dL (ref >39.00)
LDL CALC: 144 mg/dL — AB (ref 0–99)
NonHDL: 166.99
TRIGLYCERIDES: 116 mg/dL (ref 0.0–149.0)
Total CHOL/HDL Ratio: 4
VLDL: 23.2 mg/dL (ref 0.0–40.0)

## 2016-05-31 NOTE — Patient Instructions (Signed)
BEFORE YOU LEAVE: -low back exercises -flu shot and Tdap -follow up: schedule physical exam in 3 months  I am so glad that you have found something that is helping the back pain!  Once your chiropractor feels you are ready, please start a core strengthening program and regular aerobic exercise (water aerobics or walking on a flat surface may be best). Ease into these activities gradually but with a goal of 150 minutes of exercise per week minimum.  We have ordered labs or studies at this visit. It can take up to 1-2 weeks for results and processing. IF results require follow up or explanation, we will call you with instructions. Clinically stable results will be released to your [facility]. If you have not heard from [place] or cannot find your results in [facility] in 2 weeks please contact our office at [phone].  If you are not yet signed up for [facility] please consider signing up.   We recommend the following healthy lifestyle for LIFE. I think a healthy, nutrient rich diet may help with the back issues as well. 1) Small portions.   Tip: eat off of a salad plate instead of a dinner plate.  Tip: if you need more or a snack choose fruits, veggies and/or a handful of nuts or seeds.  2) Eat a healthy clean diet.  * Tip: Avoid (less then 1 serving per week): processed foods, sweets, sweetened drinks, white starches (rice, flour, bread, potatoes, pasta, etc), red meat, fast foods, butter  *Tip: CHOOSE instead   * 5-9 servings per day of fresh or frozen fruits and vegetables (but not corn, potatoes, bananas, canned or dried fruit)   *nuts and seeds, beans   *olives and olive oil   *small portions of lean meats such as fish and white chicken    *small portions of whole grains  3)Get at least 150 minutes of sweaty aerobic exercise per week.  4)Reduce stress - consider counseling, meditation and relaxation to balance other aspects of your life.

## 2016-05-31 NOTE — Progress Notes (Signed)
Pre visit review using our clinic review tool, if applicable. No additional management support is needed unless otherwise documented below in the visit note. 

## 2016-06-03 DIAGNOSIS — M5136 Other intervertebral disc degeneration, lumbar region: Secondary | ICD-10-CM | POA: Diagnosis not present

## 2016-06-03 DIAGNOSIS — M9904 Segmental and somatic dysfunction of sacral region: Secondary | ICD-10-CM | POA: Diagnosis not present

## 2016-06-03 DIAGNOSIS — M9905 Segmental and somatic dysfunction of pelvic region: Secondary | ICD-10-CM | POA: Diagnosis not present

## 2016-06-03 DIAGNOSIS — M9903 Segmental and somatic dysfunction of lumbar region: Secondary | ICD-10-CM | POA: Diagnosis not present

## 2016-06-04 MED FILL — traMADol HCL 50 MG TABS: 50 | 7 days supply | Qty: 45 | Fill #0

## 2016-06-04 NOTE — Addendum Note (Signed)
Addended by: Agnes Lawrence on: 06/04/2016 05:51 PM   Modules accepted: Orders

## 2016-06-05 DIAGNOSIS — M9903 Segmental and somatic dysfunction of lumbar region: Secondary | ICD-10-CM | POA: Diagnosis not present

## 2016-06-05 DIAGNOSIS — M9904 Segmental and somatic dysfunction of sacral region: Secondary | ICD-10-CM | POA: Diagnosis not present

## 2016-06-05 DIAGNOSIS — M5136 Other intervertebral disc degeneration, lumbar region: Secondary | ICD-10-CM | POA: Diagnosis not present

## 2016-06-05 DIAGNOSIS — M9905 Segmental and somatic dysfunction of pelvic region: Secondary | ICD-10-CM | POA: Diagnosis not present

## 2016-06-07 DIAGNOSIS — M9904 Segmental and somatic dysfunction of sacral region: Secondary | ICD-10-CM | POA: Diagnosis not present

## 2016-06-07 DIAGNOSIS — M5136 Other intervertebral disc degeneration, lumbar region: Secondary | ICD-10-CM | POA: Diagnosis not present

## 2016-06-07 DIAGNOSIS — M9905 Segmental and somatic dysfunction of pelvic region: Secondary | ICD-10-CM | POA: Diagnosis not present

## 2016-06-07 DIAGNOSIS — M9903 Segmental and somatic dysfunction of lumbar region: Secondary | ICD-10-CM | POA: Diagnosis not present

## 2016-06-10 DIAGNOSIS — M9904 Segmental and somatic dysfunction of sacral region: Secondary | ICD-10-CM | POA: Diagnosis not present

## 2016-06-10 DIAGNOSIS — M9905 Segmental and somatic dysfunction of pelvic region: Secondary | ICD-10-CM | POA: Diagnosis not present

## 2016-06-10 DIAGNOSIS — M5136 Other intervertebral disc degeneration, lumbar region: Secondary | ICD-10-CM | POA: Diagnosis not present

## 2016-06-10 DIAGNOSIS — M9903 Segmental and somatic dysfunction of lumbar region: Secondary | ICD-10-CM | POA: Diagnosis not present

## 2016-06-18 DIAGNOSIS — M5136 Other intervertebral disc degeneration, lumbar region: Secondary | ICD-10-CM | POA: Diagnosis not present

## 2016-06-18 DIAGNOSIS — M9904 Segmental and somatic dysfunction of sacral region: Secondary | ICD-10-CM | POA: Diagnosis not present

## 2016-06-18 DIAGNOSIS — M9905 Segmental and somatic dysfunction of pelvic region: Secondary | ICD-10-CM | POA: Diagnosis not present

## 2016-06-18 DIAGNOSIS — M9903 Segmental and somatic dysfunction of lumbar region: Secondary | ICD-10-CM | POA: Diagnosis not present

## 2016-06-25 DIAGNOSIS — M5136 Other intervertebral disc degeneration, lumbar region: Secondary | ICD-10-CM | POA: Diagnosis not present

## 2016-06-25 DIAGNOSIS — M9905 Segmental and somatic dysfunction of pelvic region: Secondary | ICD-10-CM | POA: Diagnosis not present

## 2016-06-25 DIAGNOSIS — M9903 Segmental and somatic dysfunction of lumbar region: Secondary | ICD-10-CM | POA: Diagnosis not present

## 2016-06-25 DIAGNOSIS — M9904 Segmental and somatic dysfunction of sacral region: Secondary | ICD-10-CM | POA: Diagnosis not present

## 2016-06-26 ENCOUNTER — Other Ambulatory Visit: Payer: Self-pay | Admitting: Family Medicine

## 2016-06-26 MED FILL — GABAPENTIN 100 MG CAPSULE: 100 | 90 days supply | Qty: 360 | Fill #1

## 2016-06-27 ENCOUNTER — Other Ambulatory Visit: Payer: Self-pay | Admitting: Rheumatology

## 2016-06-27 DIAGNOSIS — M9904 Segmental and somatic dysfunction of sacral region: Secondary | ICD-10-CM | POA: Diagnosis not present

## 2016-06-27 DIAGNOSIS — E040 Nontoxic diffuse goiter: Secondary | ICD-10-CM | POA: Diagnosis not present

## 2016-06-27 DIAGNOSIS — M5136 Other intervertebral disc degeneration, lumbar region: Secondary | ICD-10-CM | POA: Diagnosis not present

## 2016-06-27 DIAGNOSIS — M9905 Segmental and somatic dysfunction of pelvic region: Secondary | ICD-10-CM | POA: Diagnosis not present

## 2016-06-27 DIAGNOSIS — M9903 Segmental and somatic dysfunction of lumbar region: Secondary | ICD-10-CM | POA: Diagnosis not present

## 2016-06-27 DIAGNOSIS — Z79899 Other long term (current) drug therapy: Secondary | ICD-10-CM | POA: Diagnosis not present

## 2016-06-27 DIAGNOSIS — H6121 Impacted cerumen, right ear: Secondary | ICD-10-CM | POA: Diagnosis not present

## 2016-06-27 LAB — CBC WITH DIFFERENTIAL/PLATELET
BASOS ABS: 55 {cells}/uL (ref 0–200)
BASOS PCT: 1 %
EOS ABS: 110 {cells}/uL (ref 15–500)
Eosinophils Relative: 2 %
HEMATOCRIT: 40.5 % (ref 35.0–45.0)
Hemoglobin: 13.5 g/dL (ref 11.7–15.5)
LYMPHS PCT: 31 %
Lymphs Abs: 1705 cells/uL (ref 850–3900)
MCH: 28.7 pg (ref 27.0–33.0)
MCHC: 33.3 g/dL (ref 32.0–36.0)
MCV: 86.2 fL (ref 80.0–100.0)
MONO ABS: 550 {cells}/uL (ref 200–950)
MONOS PCT: 10 %
MPV: 8.9 fL (ref 7.5–12.5)
NEUTROS ABS: 3080 {cells}/uL (ref 1500–7800)
Neutrophils Relative %: 56 %
Platelets: 243 10*3/uL (ref 140–400)
RBC: 4.7 MIL/uL (ref 3.80–5.10)
RDW: 13.5 % (ref 11.0–15.0)
WBC: 5.5 10*3/uL (ref 3.8–10.8)

## 2016-06-27 MED FILL — METOPROLOL SUCC ER 50 MG TA: 50 | 90 days supply | Qty: 180 | Fill #0

## 2016-06-28 LAB — COMPLETE METABOLIC PANEL WITH GFR
ALT: 21 U/L (ref 6–29)
AST: 20 U/L (ref 10–35)
Albumin: 4.1 g/dL (ref 3.6–5.1)
Alkaline Phosphatase: 67 U/L (ref 33–130)
BILIRUBIN TOTAL: 0.5 mg/dL (ref 0.2–1.2)
BUN: 11 mg/dL (ref 7–25)
CHLORIDE: 98 mmol/L (ref 98–110)
CO2: 26 mmol/L (ref 20–31)
CREATININE: 0.95 mg/dL (ref 0.50–0.99)
Calcium: 10.6 mg/dL — ABNORMAL HIGH (ref 8.6–10.4)
GFR, EST AFRICAN AMERICAN: 72 mL/min (ref >=60)
GFR, Est Non African American: 62 mL/min (ref >=60)
GLUCOSE: 107 mg/dL — AB (ref 65–99)
Potassium: 3.8 mmol/L (ref 3.5–5.3)
SODIUM: 135 mmol/L (ref 135–146)
TOTAL PROTEIN: 6.8 g/dL (ref 6.1–8.1)

## 2016-06-28 NOTE — Progress Notes (Signed)
No calcium suppl or Vit D

## 2016-07-01 ENCOUNTER — Other Ambulatory Visit (INDEPENDENT_AMBULATORY_CARE_PROVIDER_SITE_OTHER): Payer: 59

## 2016-07-01 DIAGNOSIS — M9903 Segmental and somatic dysfunction of lumbar region: Secondary | ICD-10-CM | POA: Diagnosis not present

## 2016-07-01 DIAGNOSIS — M5136 Other intervertebral disc degeneration, lumbar region: Secondary | ICD-10-CM | POA: Diagnosis not present

## 2016-07-01 DIAGNOSIS — M9905 Segmental and somatic dysfunction of pelvic region: Secondary | ICD-10-CM | POA: Diagnosis not present

## 2016-07-01 DIAGNOSIS — M9904 Segmental and somatic dysfunction of sacral region: Secondary | ICD-10-CM | POA: Diagnosis not present

## 2016-07-02 LAB — CALCIUM, IONIZED: CALCIUM ION: 5.6 mg/dL (ref 4.8–5.6)

## 2016-07-03 DIAGNOSIS — M5136 Other intervertebral disc degeneration, lumbar region: Secondary | ICD-10-CM | POA: Diagnosis not present

## 2016-07-03 DIAGNOSIS — M9905 Segmental and somatic dysfunction of pelvic region: Secondary | ICD-10-CM | POA: Diagnosis not present

## 2016-07-03 DIAGNOSIS — M9903 Segmental and somatic dysfunction of lumbar region: Secondary | ICD-10-CM | POA: Diagnosis not present

## 2016-07-03 DIAGNOSIS — M9904 Segmental and somatic dysfunction of sacral region: Secondary | ICD-10-CM | POA: Diagnosis not present

## 2016-07-17 ENCOUNTER — Other Ambulatory Visit: Payer: Self-pay | Admitting: Rheumatology

## 2016-07-17 MED FILL — sulfaSALAzine 500 MG TABS: 500 | 30 days supply | Qty: 120 | Fill #0

## 2016-07-18 ENCOUNTER — Encounter: Payer: Self-pay | Admitting: Family Medicine

## 2016-07-18 ENCOUNTER — Ambulatory Visit (INDEPENDENT_AMBULATORY_CARE_PROVIDER_SITE_OTHER): Payer: 59 | Admitting: Family Medicine

## 2016-07-18 VITALS — BP 140/88 | HR 74 | Temp 98.0°F | Ht 66.0 in | Wt 228.7 lb

## 2016-07-18 DIAGNOSIS — M069 Rheumatoid arthritis, unspecified: Secondary | ICD-10-CM | POA: Diagnosis not present

## 2016-07-18 DIAGNOSIS — M5440 Lumbago with sciatica, unspecified side: Secondary | ICD-10-CM | POA: Diagnosis not present

## 2016-07-18 DIAGNOSIS — G8929 Other chronic pain: Secondary | ICD-10-CM

## 2016-07-18 DIAGNOSIS — M5417 Radiculopathy, lumbosacral region: Secondary | ICD-10-CM | POA: Diagnosis not present

## 2016-07-18 MED ORDER — CYCLOBENZAPRINE HCL 5 MG PO TABS: 5.0000 mg | ORAL_TABLET | Freq: Every evening | ORAL | 0 refills | Status: DC | PRN

## 2016-07-18 MED FILL — CYCLOBENZAPRINE 5 MG TABLET: 5 | 30 days supply | Qty: 30 | Fill #0

## 2016-07-18 NOTE — Progress Notes (Signed)
Pre visit review using our clinic review tool, if applicable. No additional management support is needed unless otherwise documented below in the visit note. 

## 2016-07-18 NOTE — Patient Instructions (Signed)
Keep follow up as scheduled and we will plan to recheck labs then.  Use the flexeril as needed but limit as we discussed.  Start to gradual work up on walking on flat surface. Start with 5 minutes, then increase by 5 minutes every 1-2 weeks.  Work on eating a healthy clean diet that helps your body to heal - I put some tips on a healthy anti-inflammatory diet below.   -We placed a referral for you as discussed. It usually takes about 1-2 weeks to process and schedule this referral. If you have not heard from [place] regarding this appointment in 2 weeks please contact our office.   We recommend the following healthy lifestyle for LIFE: 1) Small portions.   Tip: eat off of a salad plate instead of a dinner plate.  Tip: It is ok to feel hungry after a meal of proper portion sizes  Tip: if you need more or a snack choose fruits, veggies and/or a handful of nuts or seeds.  2) Eat a healthy clean diet.  * Tip: Avoid (less then 1 serving per week): processed foods, sweets, sweetened drinks, white starches (rice, flour, bread, potatoes, pasta, etc), red meat, fast foods, butter  *Tip: CHOOSE instead   * 5-9 servings per day of fresh or frozen fruits and vegetables (but not corn, potatoes, bananas, canned or dried fruit)   *nuts and seeds, beans   *olives and olive oil   *small portions of lean meats such as fish and white chicken    *small portions of whole grains  3)Get at least 150 minutes of sweaty aerobic exercise per week.  4)Reduce stress - consider counseling, meditation and relaxation to balance other aspects of your life.

## 2016-07-18 NOTE — Progress Notes (Signed)
HPI:  [name] is a pleasant 65 year old, relatively new patient to me, with a past medical history significant for chronic back pain/DDD(followed by several specialists including orthopedics, rheumatology and chiropractic), acid reflux, hypertension, osteoarthritis and rheumatoid arthritis (managed by her rheumatologist) here for follow-up. She did not like her orthopedic specialist, and at her last visit was doing well with chiropractic care. Today she reports she is doing "100% better" compared to her debilitating severe pain in the past, however he still has some right sided low back pain that radiates to the right buttock. She continues to work with her chiropractic doctor on this and he suggested a muscle relaxer at night may be helpful. She is here today to see if I will give her a prescription for this. She has started to get back to some of her usual activities including yardwork. She does not get any regular exercise. Denies weakness, numbness, bowel or bladder incontinence, unexplained weight loss, fevers or malaise. She seems to have poor diet. She had a mildly elevated calcium on basic labs and recheck was normal after holding supplements. We did advise that she continue to hold off on supplements  and plan was to monitor on lab work. This is also monitored by her rheumatologist. Her rheumatologist advised holding vitamin D. She wonders when this will be reexamined. Wants to transfer rheumatology care to Dr. Amil Amen and request referral. His office is much close to her home and he has in office lab which she feels will be much more convenient. She scheduled for preventive care visit in January.  ROS: See pertinent positives and negatives per HPI.  Past Medical History:  Diagnosis Date  . Allergy   . Cataract    BILATERAL-REMOVED 2 YEARS AGO  . GERD (gastroesophageal reflux disease)   . Hx of colonic polyp - ssp 11/03/2014  . Hypertension   . 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 Osteopenia 10/17/2011   DEXA 09/2007: -1.4 L fem; 10/2011: -1.2 L fem   . Pseudogout of foot   . Rheumatoid arthritis(714.0) dx 2010    Past Surgical History:  Procedure Laterality Date  . BREAST BIOPSY  1972  . Broken wrist  2010  . CATARACT EXTRACTION  03/2012   left  . COLONOSCOPY    . COSMETIC SURGERY    . FRACTURE SURGERY    . INNER EAR SURGERY     busted ear drum  . pneumonia  2007    Family History  Problem Relation Age of Onset  . Hypertension Father   . Hypertension Sister     x 3  . Hypertension Brother   . Heart attack Father   . Hyperthyroidism Sister     x2, s/p RAI ablation  . Hypothyroidism Brother   . Stroke Mother     Social History   Social History  . Marital status: Married    Spouse name: N/A  . Number of children: 3  . Years of education: N/A   Occupational History  . Retired    Social History Main Topics  . Smoking status: Former Smoker    Types: Cigarettes  . Smokeless tobacco: Former Systems developer    Quit date: 08/19/1981  . Alcohol use No  . Drug use: No  . Sexual activity: Not Asked   Other Topics Concern  . None   Social History Narrative   Artist -retired Building control surveyor   Married, lives with spouse, [name], he is IT support for [employer]  3 sons   2 caffeinated beverages a day   No regular exercise, diet is ok     Current Outpatient Prescriptions:  .  aspirin 81 MG tablet, Take 81 mg by mouth daily., Disp: , Rfl:  .  gabapentin (NEURONTIN) 400 MG capsule, Take 1 capsule (400 mg total) by mouth 3 (three) times daily. (Patient taking differently: Take 100 mg by mouth 2 (two) times daily. Takes 200mg  every morning and 100mg  at bedtime), Disp: 90 capsule, Rfl: 3 .  guaiFENesin (MUCINEX) 600 MG 12 hr tablet, Take 1,200 mg by mouth 2 (two) times daily as needed for cough or to loosen phlegm. , Disp: , Rfl:  .  hydroxychloroquine (PLAQUENIL) 200 MG tablet, Take 200 mg by mouth daily. , Disp: , Rfl:  .  Loratadine 10 MG  CAPS, Take 10 mg by mouth daily as needed (allergies). , Disp: , Rfl:  .  losartan-hydrochlorothiazide (HYZAAR) 100-25 MG tablet, Take 1 tablet by mouth daily., Disp: 90 tablet, Rfl: 3 .  metoprolol succinate (TOPROL-XL) 50 MG 24 hr tablet, TAKE 2 TABLETS BY MOUTH ONCE DAILY, Disp: 180 tablet, Rfl: 1 .  naproxen sodium (ALEVE) 220 MG tablet, Take 1 tablet (220 mg total) by mouth 2 (two) times daily with a meal., Disp: , Rfl:  .  ranitidine (ZANTAC) 150 MG tablet, Take 150 mg by mouth daily as needed for heartburn. , Disp: , Rfl:  .  sulfaSALAzine (AZULFIDINE) 500 MG EC tablet, TAKE 2 TABLETS BY MOUTH TWICE DAILY, Disp: 120 tablet, Rfl: 0 .  B Complex-C (B-COMPLEX WITH VITAMIN C) tablet, Take 2 tablets by mouth daily., Disp: , Rfl:  .  BIOTIN PO, Take 5,000 mg by mouth daily., Disp: , Rfl:  .  cholecalciferol (VITAMIN D) 1000 units tablet, Take 1,000 Units by mouth daily., Disp: , Rfl:  .  Cyanocobalamin (VITAMIN B-12 PO), Take 2,500 mg by mouth daily., Disp: , Rfl:  .  cyclobenzaprine (FLEXERIL) 5 MG tablet, Take 1 tablet (5 mg total) by mouth at bedtime as needed for muscle spasms., Disp: 30 tablet, Rfl: 0 .  pyridOXINE (VITAMIN B-6) 100 MG tablet, Take 100 mg by mouth daily., Disp: , Rfl:  .  thiamine (VITAMIN B-1) 100 MG tablet, Take 300 mg by mouth daily., Disp: , Rfl:   EXAM:  Vitals:   07/18/16 1312  BP: 140/88  Pulse: 74  Temp: 98 F (36.7 C)    Body mass index is 36.91 kg/m.  GENERAL: vitals reviewed and listed above, alert, oriented, appears well hydrated and in no acute distress  HEENT: atraumatic, conjunttiva clear, no obvious abnormalities on inspection of external nose and ears  NECK: no obvious masses on inspection  LUNGS: clear to auscultation bilaterally, no wheezes, rales or rhonchi, good air movement  CV: HRRR, no peripheral edema  MS: moves all extremities without noticeable abnormality, normal gait, normal function of the lower extremities   PSYCH: pleasant  and cooperative, no obvious depression or anxiety  ASSESSMENT AND PLAN:  Discussed the following assessment and plan:  Chronic low back pain with sciatica, sciatica laterality unspecified, unspecified back pain laterality  L-S radiculopathy  Rheumatoid arthritis, involving unspecified site, unspecified rheumatoid factor presence ([place]) - Plan: Ambulatory referral to Rheumatology  -flexeril rx for intermittent use provided after discussion risks -referral per her request to another rheumatologist due to more convenient location -offered referral to a different back specialist, but she feels is doing so much better with her chiropractor then she ever did and  prefers to continue that instead -advised healthy diet, gradually trying to get some gentle exercise and weight reduction -plan to recheck ion ca and ipth at [place] -Patient advised to return or notify a doctor immediately if symptoms worsen or persist or new concerns arise.  Patient Instructions  Keep follow up as scheduled and we will plan to recheck labs then.  Use the flexeril as needed but limit as we discussed.  Start to gradual work up on walking on flat surface. Start with 5 minutes, then increase by 5 minutes every 1-2 weeks.  Work on eating a healthy clean diet that helps your body to heal - I put some tips on a healthy anti-inflammatory diet below.   -We placed a referral for you as discussed. It usually takes about 1-2 weeks to process and schedule this referral. If you have not heard from [place] regarding this appointment in 2 weeks please contact our office.   We recommend the following healthy lifestyle for LIFE: 1) Small portions.   Tip: eat off of a salad plate instead of a dinner plate.  Tip: It is ok to feel hungry after a meal of proper portion sizes  Tip: if you need more or a snack choose fruits, veggies and/or a handful of nuts or seeds.  2) Eat a healthy clean diet.  * Tip: Avoid (less then 1 serving per  week): processed foods, sweets, sweetened drinks, white starches (rice, flour, bread, potatoes, pasta, etc), red meat, fast foods, butter  *Tip: CHOOSE instead   * 5-9 servings per day of fresh or frozen fruits and vegetables (but not corn, potatoes, bananas, canned or dried fruit)   *nuts and seeds, beans   *olives and olive oil   *small portions of lean meats such as fish and white chicken    *small portions of whole grains  3)Get at least 150 minutes of sweaty aerobic exercise per week.  4)Reduce stress - consider counseling, meditation and relaxation to balance other aspects of your life.      Colin Benton R., DO

## 2016-08-08 ENCOUNTER — Ambulatory Visit (INDEPENDENT_AMBULATORY_CARE_PROVIDER_SITE_OTHER): Payer: 59 | Admitting: Family Medicine

## 2016-08-08 ENCOUNTER — Encounter: Payer: Self-pay | Admitting: Family Medicine

## 2016-08-08 VITALS — BP 136/82 | HR 87 | Temp 98.3°F | Ht 66.0 in | Wt 230.8 lb

## 2016-08-08 DIAGNOSIS — G6289 Other specified polyneuropathies: Secondary | ICD-10-CM

## 2016-08-08 DIAGNOSIS — I999 Unspecified disorder of circulatory system: Secondary | ICD-10-CM

## 2016-08-08 DIAGNOSIS — L03115 Cellulitis of right lower limb: Secondary | ICD-10-CM | POA: Diagnosis not present

## 2016-08-08 DIAGNOSIS — R230 Cyanosis: Secondary | ICD-10-CM

## 2016-08-08 DIAGNOSIS — R0989 Other specified symptoms and signs involving the circulatory and respiratory systems: Secondary | ICD-10-CM

## 2016-08-08 MED ORDER — CEPHALEXIN 500 MG PO CAPS: 500.0000 mg | ORAL_CAPSULE | Freq: Three times a day (TID) | ORAL | 0 refills | Status: DC

## 2016-08-08 MED FILL — CEPHALEXIN 500 MG CAPSULE: 500 | 5 days supply | Qty: 15 | Fill #0

## 2016-08-08 NOTE — Progress Notes (Addendum)
HPI:  [name] is a pleasant 65 year old with a past medical history significant for rheumatoid arthritis, peripheral neuropathy, hypertension, acid reflux, seasonal allergies and chronic pain here for an acute visit for lower extremity edema, pain in feet and redness of skin on feet. Reports her feet are chronically (for several years )mildly swollen and discolored - blue or purple most of the time. She has peripheral neuropathy and saw a podiatrist about this and was told there was nothing to be done other than gabapentin. She went shopping 3 days ago and pain, redness, swelling in her feet worsened. Denies: Fevers, malaise, redness or swelling in the legs, shortness of breath. She reports that putting her feet in ice actually helps them in terms of coloring and pain. She would like to see a vascular specialist to evaluate the circulation in her feet.  ROS: See pertinent positives and negatives per HPI.  Past Medical History:  Diagnosis Date  . Allergy   . Cataract    BILATERAL-REMOVED 2 YEARS AGO  . GERD (gastroesophageal reflux disease)   . Hx of colonic polyp - ssp 11/03/2014  . Hypertension   . 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 Osteopenia 10/17/2011   DEXA 09/2007: -1.4 L fem; 10/2011: -1.2 L fem   . Pseudogout of foot   . Rheumatoid arthritis(714.0) dx 2010    Past Surgical History:  Procedure Laterality Date  . BREAST BIOPSY  1972  . Broken wrist  2010  . CATARACT EXTRACTION  03/2012   left  . COLONOSCOPY    . COSMETIC SURGERY    . FRACTURE SURGERY    . INNER EAR SURGERY     busted ear drum  . pneumonia  2007    Family History  Problem Relation Age of Onset  . Hypertension Father   . Hypertension Sister     x 3  . Hypertension Brother   . Heart attack Father   . Hyperthyroidism Sister     x2, s/p RAI ablation  . Hypothyroidism Brother   . Stroke Mother     Social History   Social History  . Marital status: Married    Spouse name: N/A  . Number of  children: 3  . Years of education: N/A   Occupational History  . Retired    Social History Main Topics  . Smoking status: Former Smoker    Types: Cigarettes  . Smokeless tobacco: Former Systems developer    Quit date: 08/19/1981  . Alcohol use No  . Drug use: No  . Sexual activity: Not Asked   Other Topics Concern  . None   Social History Narrative   Artist -retired Building control surveyor   Married, lives with spouse, [name], he is IT support for [employer]   3 sons   2 caffeinated beverages a day   No regular exercise, diet is ok     Current Outpatient Prescriptions:  .  aspirin 81 MG tablet, Take 81 mg by mouth daily., Disp: , Rfl:  .  B Complex-C (B-COMPLEX WITH VITAMIN C) tablet, Take 2 tablets by mouth daily., Disp: , Rfl:  .  BIOTIN PO, Take 5,000 mg by mouth daily., Disp: , Rfl:  .  cholecalciferol (VITAMIN D) 1000 units tablet, Take 1,000 Units by mouth daily., Disp: , Rfl:  .  Cyanocobalamin (VITAMIN B-12 PO), Take 2,500 mg by mouth daily., Disp: , Rfl:  .  cyclobenzaprine (FLEXERIL) 5 MG tablet, Take 1 tablet (5 mg total) by mouth  at bedtime as needed for muscle spasms., Disp: 30 tablet, Rfl: 0 .  gabapentin (NEURONTIN) 400 MG capsule, Take 1 capsule (400 mg total) by mouth 3 (three) times daily. (Patient taking differently: Take 100 mg by mouth 2 (two) times daily. Takes 200mg  every morning and 100mg  at bedtime), Disp: 90 capsule, Rfl: 3 .  guaiFENesin (MUCINEX) 600 MG 12 hr tablet, Take 1,200 mg by mouth 2 (two) times daily as needed for cough or to loosen phlegm. , Disp: , Rfl:  .  hydroxychloroquine (PLAQUENIL) 200 MG tablet, Take 200 mg by mouth daily. , Disp: , Rfl:  .  Loratadine 10 MG CAPS, Take 10 mg by mouth daily as needed (allergies). , Disp: , Rfl:  .  losartan-hydrochlorothiazide (HYZAAR) 100-25 MG tablet, Take 1 tablet by mouth daily., Disp: 90 tablet, Rfl: 3 .  metoprolol succinate (TOPROL-XL) 50 MG 24 hr tablet, TAKE 2 TABLETS BY MOUTH ONCE DAILY, Disp: 180  tablet, Rfl: 1 .  naproxen sodium (ALEVE) 220 MG tablet, Take 1 tablet (220 mg total) by mouth 2 (two) times daily with a meal., Disp: , Rfl:  .  pyridOXINE (VITAMIN B-6) 100 MG tablet, Take 100 mg by mouth daily., Disp: , Rfl:  .  ranitidine (ZANTAC) 150 MG tablet, Take 150 mg by mouth daily as needed for heartburn. , Disp: , Rfl:  .  sulfaSALAzine (AZULFIDINE) 500 MG EC tablet, TAKE 2 TABLETS BY MOUTH TWICE DAILY, Disp: 120 tablet, Rfl: 0 .  thiamine (VITAMIN B-1) 100 MG tablet, Take 300 mg by mouth daily., Disp: , Rfl:   EXAM:  Vitals:   08/08/16 1612  BP: 136/82  Pulse: 87  Temp: 98.3 F (36.8 C)    Body mass index is 37.25 kg/m.  GENERAL: vitals reviewed and listed above, alert, oriented, appears well hydrated and in no acute distress  HEENT: atraumatic, conjunttiva clear, no obvious abnormalities on inspection of external nose and ears  NECK: no obvious masses on inspection  LUNGS: clear to auscultation bilaterally, no wheezes, rales or rhonchi, good air movement  CV: HRRR, mild ankle and foot edema bilat  MS: moves all extremities without noticeable abnormality  SKIN: purpulish discoloration of skin on feet and toes, pedal pulses ok, blotchy erythema and warmth dorsal R foot, normal cap refill  PSYCH: pleasant and cooperative, no obvious depression or anxiety  ASSESSMENT AND PLAN:  Discussed the following assessment and plan:  Poor circulation  Blue toes  Other polyneuropathy (HCC)  Cellulitis of right lower extremity  -looks like she may be developing cellulitis - opted to start abx -referral to vasc per her preference after discussion -elevate legs -Patient advised to return or notify a doctor immediately if symptoms worsen or persist or new concerns arise.  There are no Patient Instructions on file for this visit.  Colin Benton R., DO

## 2016-08-08 NOTE — Patient Instructions (Signed)
Please start the antibiotic, Keflex, and take 3 times daily.  Elevate the feet above the waist for 30 minutes twice daily.  We placed a referral for you as discussed to the vascular specialist. It usually takes about 1-2 weeks to process and schedule this referral. If you have not heard from [place] regarding this appointment in 2 weeks please contact our office.  Care if worsening symptoms, fevers, redness is spreading, new concerns or if symptoms are not improving with treatment.

## 2016-08-08 NOTE — Progress Notes (Signed)
Pre visit review using our clinic review tool, if applicable. No additional management support is needed unless otherwise documented below in the visit note. 

## 2016-08-14 DIAGNOSIS — M0609 Rheumatoid arthritis without rheumatoid factor, multiple sites: Secondary | ICD-10-CM | POA: Diagnosis not present

## 2016-08-14 DIAGNOSIS — M5136 Other intervertebral disc degeneration, lumbar region: Secondary | ICD-10-CM | POA: Diagnosis not present

## 2016-08-14 DIAGNOSIS — M2550 Pain in unspecified joint: Secondary | ICD-10-CM | POA: Diagnosis not present

## 2016-08-14 DIAGNOSIS — M5441 Lumbago with sciatica, right side: Secondary | ICD-10-CM | POA: Diagnosis not present

## 2016-08-14 DIAGNOSIS — E669 Obesity, unspecified: Secondary | ICD-10-CM | POA: Diagnosis not present

## 2016-08-14 DIAGNOSIS — Z6838 Body mass index (BMI) 38.0-38.9, adult: Secondary | ICD-10-CM | POA: Diagnosis not present

## 2016-08-15 ENCOUNTER — Telehealth: Payer: Self-pay | Admitting: Rheumatology

## 2016-08-15 NOTE — Telephone Encounter (Signed)
Patient wanted [place] to know she has switched to [facility] rheumatology. She appreciates everything Dr. Estanislado Pandy has done for her; she just needs to get a doctor that is closer to her home.

## 2016-08-20 ENCOUNTER — Other Ambulatory Visit: Payer: Self-pay | Admitting: Rheumatology

## 2016-08-20 DIAGNOSIS — M9905 Segmental and somatic dysfunction of pelvic region: Secondary | ICD-10-CM | POA: Diagnosis not present

## 2016-08-20 DIAGNOSIS — M9904 Segmental and somatic dysfunction of sacral region: Secondary | ICD-10-CM | POA: Diagnosis not present

## 2016-08-20 DIAGNOSIS — M9903 Segmental and somatic dysfunction of lumbar region: Secondary | ICD-10-CM | POA: Diagnosis not present

## 2016-08-20 DIAGNOSIS — M5441 Lumbago with sciatica, right side: Secondary | ICD-10-CM | POA: Diagnosis not present

## 2016-08-20 MED FILL — LOSARTAN-HCTZ 100-25 MG TAB: 100-25 | 90 days supply | Qty: 90 | Fill #3

## 2016-08-20 MED FILL — HYDROXYCHLOROQUINE 200 MG T: 200 | 30 days supply | Qty: 30 | Fill #0

## 2016-08-20 MED FILL — sulfaSALAzine 500 MG TABS: 500 | 30 days supply | Qty: 120 | Fill #0

## 2016-08-20 NOTE — Telephone Encounter (Signed)
Patient will be seeing Dr. Amil Amen as her Rheumatologist but is requesting two of her Rxs (at least 1 wk supply) until her upcoming appt. Plaquenil & (other that could not make out from phone message left...ending in "zine")

## 2016-08-21 NOTE — Telephone Encounter (Signed)
Patient states we can disregard the message because Dr. Melissa Noon office filled the prescriptions for her.

## 2016-08-29 DIAGNOSIS — M9905 Segmental and somatic dysfunction of pelvic region: Secondary | ICD-10-CM | POA: Diagnosis not present

## 2016-08-29 DIAGNOSIS — M5441 Lumbago with sciatica, right side: Secondary | ICD-10-CM | POA: Diagnosis not present

## 2016-08-29 DIAGNOSIS — M9903 Segmental and somatic dysfunction of lumbar region: Secondary | ICD-10-CM | POA: Diagnosis not present

## 2016-08-29 DIAGNOSIS — M9904 Segmental and somatic dysfunction of sacral region: Secondary | ICD-10-CM | POA: Diagnosis not present

## 2016-09-05 ENCOUNTER — Ambulatory Visit: Payer: 59 | Admitting: Family Medicine

## 2016-09-06 ENCOUNTER — Other Ambulatory Visit: Payer: Self-pay | Admitting: Surgery

## 2016-09-06 DIAGNOSIS — R230 Cyanosis: Secondary | ICD-10-CM

## 2016-09-11 NOTE — Progress Notes (Signed)
HPI:  Here for CPE:  -Concerns and/or follow up today: PMH sig for RA (now seeing [facility] rheum),Obesity, Hypertension,  peripheral neuropathy, acid reflux, chronic pain. She is seeing a new rheumatologist and has appointment in a few weeks to see neurosurgeon. Continues to see chiropractor which she feels helps the most. Take motrin several times per day which she feels is the only thing that helps her pain. No exercise. Diet ok. Due for labs, hep c screen, mammo, pna ppsv23, shingles vaccine  -Diet: variety of foods, balance and well rounded, larger portion sizes  -Exercise: no regular exercise  -Taking folic acid, vitamin D or calcium: yes  -Diabetes and Dyslipidemia Screening: labs today  -Vaccines: UTD for most part, pneumococcal today, declines shingles vaccine for now  -pap history: report regular paps and all normal in the past until 59 and now declines pelvic or pap  -sexual activity: yes, female partner, no new partners  -wants STI testing (Hep C if born 60-65): no to sti, agrees to hep c screening  -FH breast, colon or ovarian ca: see FH Last mammogram: she agrees to schedule, refuses to let us schedule Last colon cancer screening: done, utd per pt  DEXA done and normal 2013  -Alcohol, Tobacco, drug use: see social history  Review of Systems - no fevers, unintentional weight loss, vision loss, hearing loss, chest pain, sob, hemoptysis, melena, hematochezia, hematuria, genital discharge, changing or concerning skin lesions, bleeding, bruising, loc, thoughts of self harm or SI  Past Medical History:  Diagnosis Date  . Allergy   . Cataract    BILATERAL-REMOVED 2 YEARS AGO  . GERD (gastroesophageal reflux disease)   . Hx of colonic polyp - ssp 11/03/2014  . Hypertension   . 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 Osteopenia 10/17/2011   DEXA 09/2007: -1.4 L fem; 10/2011: -1.2 L fem   . Pseudogout of foot   . Rheumatoid arthritis(714.0) dx 2010    Past Surgical History:   Procedure Laterality Date  . BREAST BIOPSY  1972  . Broken wrist  2010  . CATARACT EXTRACTION  03/2012   left  . COLONOSCOPY    . COSMETIC SURGERY    . FRACTURE SURGERY    . INNER EAR SURGERY     busted ear drum  . pneumonia  2007    Family History  Problem Relation Age of Onset  . Hypertension Father   . Hypertension Sister     x 3  . Hypertension Brother   . Heart attack Father   . Hyperthyroidism Sister     x2, s/p RAI ablation  . Hypothyroidism Brother   . Stroke Mother     Social History   Social History  . Marital status: Married    Spouse name: N/A  . Number of children: 3  . Years of education: N/A   Occupational History  . Retired    Social History Main Topics  . Smoking status: Former Smoker    Types: Cigarettes  . Smokeless tobacco: Former Systems developer    Quit date: 08/19/1981  . Alcohol use No  . Drug use: No  . Sexual activity: Not Asked   Other Topics Concern  . None   Social History Narrative   Artist -retired Building control surveyor   Married, lives with spouse, [name], he is IT support for [employer]   3 sons   2 caffeinated beverages a day   No regular exercise, diet is ok     Current  Outpatient Prescriptions:  .  aspirin 81 MG tablet, Take 81 mg by mouth daily., Disp: , Rfl:  .  B Complex-C (B-COMPLEX WITH VITAMIN C) tablet, Take 2 tablets by mouth daily., Disp: , Rfl:  .  BIOTIN PO, Take 5,000 mg by mouth daily., Disp: , Rfl:  .  cholecalciferol (VITAMIN D) 1000 units tablet, Take 1,000 Units by mouth daily., Disp: , Rfl:  .  Cyanocobalamin (VITAMIN B-12 PO), Take 2,500 mg by mouth daily., Disp: , Rfl:  .  cyclobenzaprine (FLEXERIL) 5 MG tablet, Take 1 tablet (5 mg total) by mouth at bedtime as needed for muscle spasms., Disp: 30 tablet, Rfl: 0 .  gabapentin (NEURONTIN) 400 MG capsule, Take 1 capsule (400 mg total) by mouth 3 (three) times daily. (Patient taking differently: Take 100 mg by mouth 2 (two) times daily. Takes 200mg  every  morning and 100mg  at bedtime), Disp: 90 capsule, Rfl: 3 .  guaiFENesin (MUCINEX) 600 MG 12 hr tablet, Take 1,200 mg by mouth 2 (two) times daily as needed for cough or to loosen phlegm. , Disp: , Rfl:  .  hydroxychloroquine (PLAQUENIL) 200 MG tablet, Take 200 mg by mouth daily. , Disp: , Rfl:  .  Loratadine 10 MG CAPS, Take 10 mg by mouth daily as needed (allergies). , Disp: , Rfl:  .  losartan-hydrochlorothiazide (HYZAAR) 100-25 MG tablet, Take 1 tablet by mouth daily., Disp: 90 tablet, Rfl: 3 .  metoprolol succinate (TOPROL-XL) 50 MG 24 hr tablet, TAKE 2 TABLETS BY MOUTH ONCE DAILY, Disp: 180 tablet, Rfl: 1 .  naproxen sodium (ALEVE) 220 MG tablet, Take 1 tablet (220 mg total) by mouth 2 (two) times daily with a meal., Disp: , Rfl:  .  pyridOXINE (VITAMIN B-6) 100 MG tablet, Take 100 mg by mouth daily., Disp: , Rfl:  .  ranitidine (ZANTAC) 150 MG tablet, Take 150 mg by mouth daily as needed for heartburn. , Disp: , Rfl:  .  sulfaSALAzine (AZULFIDINE) 500 MG EC tablet, TAKE 2 TABLETS BY MOUTH TWICE DAILY, Disp: 120 tablet, Rfl: 0 .  thiamine (VITAMIN B-1) 100 MG tablet, Take 300 mg by mouth daily., Disp: , Rfl:  .  amLODipine (NORVASC) 5 MG tablet, Take 0.5 tablets (2.5 mg total) by mouth daily., Disp: 30 tablet, Rfl: 3  EXAM:  Vitals:   09/12/16 0914  BP: (!) 160/82  Pulse: 69  Temp: 98.2 F (36.8 C)   Body mass index is 37.38 kg/m.  GENERAL: vitals reviewed and listed below, alert, oriented, appears well hydrated and in no acute distress  HEENT: head atraumatic, PERRLA, normal appearance of eyes, ears, nose and mouth. moist mucus membranes.  NECK: supple, no masses or lymphadenopathy  LUNGS: clear to auscultation bilaterally, no rales, rhonchi or wheeze  CV: HRRR, no peripheral edema or cyanosis, normal pedal pulses  BREAST: normal appearance - no skin lesions or discharge noted on inspection of both breasts, on palpation of both breast has implants - asymmetrical with what  feels sl larger and firmer implant vs breast asymetry on the L - she reports this has been the case forever since implants and is unchanged - reports she does self breast exams on a regular basis.  GU: declined  ABDOMEN: bowel sounds normal, soft, non tender to palpation, no masses, no rebound or guarding  SKIN: no rash or abnormal lesions  MS: normal gait, moves all extremities normally  NEURO: normal gait, speech and thought processing grossly intact, muscle tone grossly intact throughout  PSYCH:  normal affect, pleasant and cooperative  ASSESSMENT AND PLAN:  Discussed the following assessment and plan:  Encounter for preventive health examination - Plan: Lipid Panel, Hemoglobin A1c, Hep C Antibody Need for prophylactic vaccination against Streptococcus pneumoniae (pneumococcus) - Plan: Pneumococcal polysaccharide vaccine 23-valent greater than or equal to 2yo subcutaneous/IM -Discussed and advised all [place] preventive services health task force level A and B recommendations for age, sex and risks. -Advised at least 150 minutes of exercise per week and a healthy diet -advised mammogram and given asymmetry advised we order - she refused and assures me this has been present since her breast augmentation and agrees to call the breast center. -labs, studies and vaccines per orders this encounter  Essential hypertension - Plan: Basic metabolic panel -add norvasc after discussion risks/options -labs -follow up 1 month  Rheumatoid arthritis, involving unspecified site, unspecified rheumatoid factor presence ([place]) -sees rheumatologist for management  Hyperlipidemia, unspecified hyperlipidemia type -labs, lifestyle recs  Neuropathy ([place]) -sees specialist  Need for prophylactic vaccination against Streptococcus pneumoniae (pneumococcus) - Plan: Pneumococcal polysaccharide vaccine 23-valent greater than or equal to 2yo subcutaneous/IM   Orders Placed This Encounter  Procedures  .  Pneumococcal polysaccharide vaccine 23-valent greater than or equal to 2yo subcutaneous/IM  . Basic metabolic panel  . Lipid Panel  . Hemoglobin A1c  . Hep C Antibody    Patient advised to return to clinic immediately if symptoms worsen or persist or new concerns.  Patient Instructions  BEFORE YOU LEAVE: -labs -PPSV23 -follow up: 1 month for hypertension  Start the norvasc (amlodipine) 2.5 mg every day in the morning for the blood pressure.  Call today to set up your mammogram  We have ordered labs or studies at this visit. It can take up to 1-2 weeks for results and processing. IF results require follow up or explanation, we will call you with instructions. Clinically stable results will be released to your [facility]. If you have not heard from [place] or cannot find your results in [facility] in 2 weeks please contact our office at [phone].  If you are not yet signed up for [facility], please consider signing up.   We recommend the following healthy lifestyle for LIFE: 1) Small portions.   Tip: eat off of a salad plate instead of a dinner plate.  Tip: It is ok to feel hungry after a meal - that likely means you ate an appropriate portion.  Tip: if you need more or a snack choose fruits, veggies and/or a handful of nuts or seeds.  2) Eat a healthy clean diet.  * Tip: Avoid (less then 1 serving per week): processed foods, sweets, sweetened drinks, white starches (rice, flour, bread, potatoes, pasta, etc), red meat, fast foods, butter  *Tip: CHOOSE instead   * 5-9 servings per day of fresh or frozen fruits and vegetables (but not corn, potatoes, bananas, canned or dried fruit)   *nuts and seeds, beans   *olives and olive oil   *small portions of lean meats such as fish and white chicken    *small portions of whole grains  3)Get at least 150 minutes of sweaty aerobic exercise per week.  4)Reduce stress - consider counseling, meditation and relaxation to balance other aspects of your  life.          No Follow-up on file.  Colin Benton R., DO

## 2016-09-12 ENCOUNTER — Encounter: Payer: Self-pay | Admitting: Family Medicine

## 2016-09-12 ENCOUNTER — Ambulatory Visit (INDEPENDENT_AMBULATORY_CARE_PROVIDER_SITE_OTHER): Payer: 59 | Admitting: Family Medicine

## 2016-09-12 VITALS — BP 160/82 | HR 69 | Temp 98.2°F | Ht 66.0 in | Wt 231.6 lb

## 2016-09-12 DIAGNOSIS — G629 Polyneuropathy, unspecified: Secondary | ICD-10-CM

## 2016-09-12 DIAGNOSIS — Z9882 Breast implant status: Secondary | ICD-10-CM | POA: Insufficient documentation

## 2016-09-12 DIAGNOSIS — E785 Hyperlipidemia, unspecified: Secondary | ICD-10-CM

## 2016-09-12 DIAGNOSIS — R899 Unspecified abnormal finding in specimens from other organs, systems and tissues: Secondary | ICD-10-CM

## 2016-09-12 DIAGNOSIS — M069 Rheumatoid arthritis, unspecified: Secondary | ICD-10-CM | POA: Diagnosis not present

## 2016-09-12 DIAGNOSIS — I10 Essential (primary) hypertension: Secondary | ICD-10-CM | POA: Diagnosis not present

## 2016-09-12 DIAGNOSIS — Z23 Encounter for immunization: Secondary | ICD-10-CM | POA: Diagnosis not present

## 2016-09-12 DIAGNOSIS — Z6837 Body mass index (BMI) 37.0-37.9, adult: Secondary | ICD-10-CM

## 2016-09-12 DIAGNOSIS — M5417 Radiculopathy, lumbosacral region: Secondary | ICD-10-CM

## 2016-09-12 DIAGNOSIS — M9904 Segmental and somatic dysfunction of sacral region: Secondary | ICD-10-CM | POA: Diagnosis not present

## 2016-09-12 DIAGNOSIS — M9905 Segmental and somatic dysfunction of pelvic region: Secondary | ICD-10-CM | POA: Diagnosis not present

## 2016-09-12 DIAGNOSIS — M9903 Segmental and somatic dysfunction of lumbar region: Secondary | ICD-10-CM | POA: Diagnosis not present

## 2016-09-12 DIAGNOSIS — Z0000 Encounter for general adult medical examination without abnormal findings: Secondary | ICD-10-CM | POA: Diagnosis not present

## 2016-09-12 DIAGNOSIS — IMO0001 Reserved for inherently not codable concepts without codable children: Secondary | ICD-10-CM

## 2016-09-12 DIAGNOSIS — E6609 Other obesity due to excess calories: Secondary | ICD-10-CM

## 2016-09-12 DIAGNOSIS — M5441 Lumbago with sciatica, right side: Secondary | ICD-10-CM | POA: Diagnosis not present

## 2016-09-12 LAB — BASIC METABOLIC PANEL
BUN: 12 mg/dL (ref 6–23)
CALCIUM: 10.6 mg/dL — AB (ref 8.4–10.5)
CHLORIDE: 101 meq/L (ref 96–112)
CO2: 29 mEq/L (ref 19–32)
CREATININE: 0.85 mg/dL (ref 0.40–1.20)
GFR: 70.76 mL/min (ref >60.00)
Glucose, Bld: 101 mg/dL — ABNORMAL HIGH (ref 70–99)
Potassium: 3.6 mEq/L (ref 3.5–5.1)
Sodium: 138 mEq/L (ref 135–145)

## 2016-09-12 LAB — LIPID PANEL
CHOLESTEROL: 232 mg/dL — AB (ref 0–200)
HDL: 49.1 mg/dL (ref >39.00)
LDL Cholesterol: 154 mg/dL — ABNORMAL HIGH (ref 0–99)
NonHDL: 182.42
Total CHOL/HDL Ratio: 5
Triglycerides: 141 mg/dL (ref 0.0–149.0)
VLDL: 28.2 mg/dL (ref 0.0–40.0)

## 2016-09-12 LAB — HEMOGLOBIN A1C: HEMOGLOBIN A1C: 5.2 % (ref 4.6–6.5)

## 2016-09-12 MED ORDER — AMLODIPINE BESYLATE 5 MG PO TABS: 0.5000 mg | ORAL_TABLET | Freq: Every day | ORAL | 3 refills | Status: DC

## 2016-09-12 MED FILL — AMLODIPINE BESYLATE 5 MG TA: 5 | 90 days supply | Qty: 45 | Fill #0

## 2016-09-12 NOTE — Patient Instructions (Addendum)
BEFORE YOU LEAVE: -labs -PPSV23 -follow up: 1 month for hypertension  Start the norvasc (amlodipine) 2.5 mg every day in the morning for the blood pressure.  Call today to set up your mammogram  We have ordered labs or studies at this visit. It can take up to 1-2 weeks for results and processing. IF results require follow up or explanation, we will call you with instructions. Clinically stable results will be released to your [facility]. If you have not heard from [place] or cannot find your results in [facility] in 2 weeks please contact our office at [phone].  If you are not yet signed up for [facility], please consider signing up.   We recommend the following healthy lifestyle for LIFE: 1) Small portions.   Tip: eat off of a salad plate instead of a dinner plate.  Tip: It is ok to feel hungry after a meal - that likely means you ate an appropriate portion.  Tip: if you need more or a snack choose fruits, veggies and/or a handful of nuts or seeds.  2) Eat a healthy clean diet.  * Tip: Avoid (less then 1 serving per week): processed foods, sweets, sweetened drinks, white starches (rice, flour, bread, potatoes, pasta, etc), red meat, fast foods, butter  *Tip: CHOOSE instead   * 5-9 servings per day of fresh or frozen fruits and vegetables (but not corn, potatoes, bananas, canned or dried fruit)   *nuts and seeds, beans   *olives and olive oil   *small portions of lean meats such as fish and white chicken    *small portions of whole grains  3)Get at least 150 minutes of sweaty aerobic exercise per week.  4)Reduce stress - consider counseling, meditation and relaxation to balance other aspects of your life.

## 2016-09-12 NOTE — Progress Notes (Signed)
Pre visit review using our clinic review tool, if applicable. No additional management support is needed unless otherwise documented below in the visit note. 

## 2016-09-13 LAB — HEPATITIS C ANTIBODY: HCV AB: NEGATIVE

## 2016-09-16 NOTE — Addendum Note (Signed)
Addended by: Agnes Lawrence on: 09/16/2016 01:13 PM   Modules accepted: Orders

## 2016-09-16 NOTE — Addendum Note (Signed)
Addended by: Agnes Lawrence on: 09/16/2016 01:11 PM   Modules accepted: Orders

## 2016-09-18 MED FILL — sulfaSALAzine 500 MG TABS: 500 | 30 days supply | Qty: 120 | Fill #1

## 2016-09-18 MED FILL — HYDROXYCHLOROQUINE 200 MG T: 200 | 30 days supply | Qty: 30 | Fill #1

## 2016-09-19 DIAGNOSIS — M9905 Segmental and somatic dysfunction of pelvic region: Secondary | ICD-10-CM | POA: Diagnosis not present

## 2016-09-19 DIAGNOSIS — M9903 Segmental and somatic dysfunction of lumbar region: Secondary | ICD-10-CM | POA: Diagnosis not present

## 2016-09-19 DIAGNOSIS — M9904 Segmental and somatic dysfunction of sacral region: Secondary | ICD-10-CM | POA: Diagnosis not present

## 2016-09-19 DIAGNOSIS — M5441 Lumbago with sciatica, right side: Secondary | ICD-10-CM | POA: Diagnosis not present

## 2016-09-20 ENCOUNTER — Encounter: Payer: Self-pay | Admitting: Surgery

## 2016-09-25 ENCOUNTER — Ambulatory Visit (INDEPENDENT_AMBULATORY_CARE_PROVIDER_SITE_OTHER): Payer: 59 | Admitting: Surgery

## 2016-09-25 ENCOUNTER — Encounter: Payer: Self-pay | Admitting: Surgery

## 2016-09-25 ENCOUNTER — Ambulatory Visit (HOSPITAL_COMMUNITY)
Admission: RE | Admit: 2016-09-25 | Discharge: 2016-09-25 | Disposition: A | Discharge status: Home / Self Care | Payer: 59 | Source: Ambulatory Visit | Attending: Surgery | Admitting: Surgery

## 2016-09-25 VITALS — BP 153/86 | HR 62 | Temp 97.7°F | Resp 18 | Ht 66.0 in | Wt 230.9 lb

## 2016-09-25 DIAGNOSIS — M7989 Other specified soft tissue disorders: Secondary | ICD-10-CM | POA: Diagnosis not present

## 2016-09-25 DIAGNOSIS — I872 Venous insufficiency (chronic) (peripheral): Secondary | ICD-10-CM

## 2016-09-25 DIAGNOSIS — R230 Cyanosis: Secondary | ICD-10-CM | POA: Insufficient documentation

## 2016-09-25 NOTE — Progress Notes (Signed)
[facility]in [facility]ist of [place]  Patient name: [name] MRN: [id number] DOB: 1951/05/09 Sex: female   REFERRING PROVIDER:    [place] FOR CONSULT:    Poor circulation  HISTORY OF PRESENT ILLNESS:   [name] is a 66 y.o. female, who is Her today for evaluation of poor circulation.  The patient states that she has been having pain in her feet for at least 2 years.  Currently her right leg bothers her more than the left.  She feels as if her feet are on fire.  They're very painful.  She also feels like her feet are going to explode.  She states that her symptoms are improved with Motrin as well as placing them in ice.  There are no aggravating factors.  Her pain appears to be constant.  She has tried Neurontin at high doses but could not tolerate it.  Lower dose Neurontin has minimal effect.  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 Vertell Limber and with neurosurgery within the next few weeks.  She states all of her symptoms got worse when she developed shingles approximately 2 years ago.  She does report having pain in her lower back.  The patient has a history of hypertension which is medically managed on multiple medications.  She is a former smoker.  PAST MEDICAL HISTORY    Past Medical History:  Diagnosis Date  . Allergy   . Cataract    BILATERAL-REMOVED 2 YEARS AGO  . GERD (gastroesophageal reflux disease)   . Hx of colonic polyp - ssp 11/03/2014  . Hypertension   . 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 Osteopenia 10/17/2011   DEXA 09/2007: -1.4 L fem; 10/2011: -1.2 L fem   . Pseudogout of foot   . Rheumatoid arthritis(714.0) dx 2010     FAMILY HISTORY   Family History  Problem Relation Age of Onset  . Hypertension Father   . Hypertension Sister     x 3  . Hypertension Brother   . Heart attack Father   . Hyperthyroidism Sister       x2, s/p RAI ablation  . Hypothyroidism Brother   . Stroke Mother     SOCIAL HISTORY:   Social History   Social History  . Marital status: Married    Spouse name: N/A  . Number of children: 3  . Years of education: N/A   Occupational History  . Retired    Social History Main Topics  . Smoking status: Former Smoker    Types: Cigarettes  . Smokeless tobacco: Former Systems developer    Quit date: 08/19/1981  . Alcohol use No  . Drug use: No  . Sexual activity: Not on file   Other Topics Concern  . Not on file   Social History Narrative   Artist -retired Building control surveyor   Married, lives with spouse, [name], he is IT support for [employer]   3 sons   2 caffeinated beverages a day   No regular exercise, diet is ok    ALLERGIES:    No Known Allergies  CURRENT MEDICATIONS:    Current Outpatient Prescriptions  Medication Sig Dispense Refill  . amLODipine (NORVASC) 5 MG tablet Take 0.5 tablets (2.5 mg total) by mouth daily. 30 tablet 3  . aspirin 81 MG tablet Take 81 mg by mouth daily.    Marland Kitchen B  Complex-C (B-COMPLEX WITH VITAMIN C) tablet Take 2 tablets by mouth daily.    Marland Kitchen BIOTIN PO Take 5,000 mg by mouth daily.    . Cyanocobalamin (VITAMIN B-12 PO) Take 2,500 mg by mouth daily.    . cyclobenzaprine (FLEXERIL) 5 MG tablet Take 1 tablet (5 mg total) by mouth at bedtime as needed for muscle spasms. 30 tablet 0  . gabapentin (NEURONTIN) 400 MG capsule Take 1 capsule (400 mg total) by mouth 3 (three) times daily. (Patient taking differently: Take 100 mg by mouth 2 (two) times daily. Takes 200mg  every morning and 100mg  at bedtime) 90 capsule 3  . guaiFENesin (MUCINEX) 600 MG 12 hr tablet Take 1,200 mg by mouth 2 (two) times daily as needed for cough or to loosen phlegm.     . hydroxychloroquine (PLAQUENIL) 200 MG tablet Take 200 mg by mouth daily.     . Loratadine 10 MG CAPS Take 10 mg by mouth daily as needed (allergies).     . losartan-hydrochlorothiazide (HYZAAR)  100-25 MG tablet Take 1 tablet by mouth daily. 90 tablet 3  . metoprolol succinate (TOPROL-XL) 50 MG 24 hr tablet TAKE 2 TABLETS BY MOUTH ONCE DAILY 180 tablet 1  . naproxen sodium (ALEVE) 220 MG tablet Take 1 tablet (220 mg total) by mouth 2 (two) times daily with a meal.    . pyridOXINE (VITAMIN B-6) 100 MG tablet Take 100 mg by mouth daily.    . ranitidine (ZANTAC) 150 MG tablet Take 150 mg by mouth daily as needed for heartburn.     . sulfaSALAzine (AZULFIDINE) 500 MG EC tablet TAKE 2 TABLETS BY MOUTH TWICE DAILY 120 tablet 0  . thiamine (VITAMIN B-1) 100 MG tablet Take 300 mg by mouth daily.     No current facility-administered medications for this visit.     REVIEW OF SYSTEMS:   [X]  denotes positive finding, [ ]  denotes negative finding Cardiac  Comments:  Chest pain or chest pressure:    Shortness of breath upon exertion:    Short of breath when lying flat:    Irregular heart rhythm:        Vascular    Pain in calf, thigh, or hip brought on by ambulation: x   Pain in feet at night that wakes you up from your sleep:  x   Blood clot in your veins:    Leg swelling:  x       Pulmonary    Oxygen at home:    Productive cough:     Wheezing:         Neurologic    Sudden weakness in arms or legs:     Sudden numbness in arms or legs:     Sudden onset of difficulty speaking or slurred speech:    Temporary loss of vision in one eye:     Problems with dizziness:         Gastrointestinal    Blood in stool:      Vomited blood:         Genitourinary    Burning when urinating:     Blood in urine:        Psychiatric    Major depression:         Hematologic    Bleeding problems:    Problems with blood clotting too easily:        Skin    Rashes or ulcers:        Constitutional    Fever or  chills:     PHYSICAL EXAM:   There were no vitals filed for this visit.  GENERAL: The patient is a well-nourished female, in no acute distress. The vital signs are documented  above. CARDIAC: There is a regular rate and rhythm.  VASCULAR: Palpable pedal pulses.  1+ pitting edema bilateral lower extremity PULMONARY: Nonlabored respirations ABDOMEN: Soft and non-tender with normal pitched bowel sounds.  MUSCULOSKELETAL: There are no major deformities or cyanosis. NEUROLOGIC: No focal weakness or paresthesias are detected. SKIN: Bluish discoloration of both feet which are warm to the touch PSYCHIATRIC: The patient has a normal affect.  STUDIES:   ABIs were performed today.  These were normal bilaterally with triphasic waveforms.  ASSESSMENT and PLAN   Bilateral foot pain:  The patient had a normal ABI today.  In addition to palpable pedal pulses, I do not think that she has arterial insufficiency contributing to her problem.  Because of her swelling, I suspect she may have some underlying component of chronic venous insufficiency.  The initial management would be with leg elevation and compression stockings.  I have recommended at least a 3 month trial of this.  She is a little concerned that she may not be able to put the compression stockings on.  The patient suffers from chronic low back pain with severe arthritis.  She is scheduled to see neurosurgery next week.  I suspect this is the underlying etiology of her leg issues.  The patient will call me in approximately 3 months if she wishes to proceed with a workup for chronic venous insufficiency.  Her next test would be a reflux evaluation of both lower extremities.   Annamarie Major, MD [facility] of [facility] [phone] Pager [phone]

## 2016-09-26 MED FILL — METOPROLOL SUCC ER 50 MG TA: 50 | 90 days supply | Qty: 180 | Fill #1

## 2016-09-27 DIAGNOSIS — Z6837 Body mass index (BMI) 37.0-37.9, adult: Secondary | ICD-10-CM | POA: Diagnosis not present

## 2016-09-27 DIAGNOSIS — E669 Obesity, unspecified: Secondary | ICD-10-CM | POA: Diagnosis not present

## 2016-09-27 DIAGNOSIS — M5136 Other intervertebral disc degeneration, lumbar region: Secondary | ICD-10-CM | POA: Diagnosis not present

## 2016-09-27 DIAGNOSIS — M2550 Pain in unspecified joint: Secondary | ICD-10-CM | POA: Diagnosis not present

## 2016-09-27 DIAGNOSIS — M7989 Other specified soft tissue disorders: Secondary | ICD-10-CM | POA: Diagnosis not present

## 2016-09-27 DIAGNOSIS — M0609 Rheumatoid arthritis without rheumatoid factor, multiple sites: Secondary | ICD-10-CM | POA: Diagnosis not present

## 2016-09-27 DIAGNOSIS — Z6838 Body mass index (BMI) 38.0-38.9, adult: Secondary | ICD-10-CM | POA: Diagnosis not present

## 2016-09-27 DIAGNOSIS — M5441 Lumbago with sciatica, right side: Secondary | ICD-10-CM | POA: Diagnosis not present

## 2016-09-30 ENCOUNTER — Telehealth: Payer: Self-pay | Admitting: Family Medicine

## 2016-09-30 MED ORDER — GABAPENTIN 100 MG PO CAPS: 200.0000 mg | ORAL_CAPSULE | Freq: Two times a day (BID) | ORAL | 1 refills | Status: DC

## 2016-09-30 MED FILL — GABAPENTIN 100 MG CAPSULE: 100 | 90 days supply | Qty: 360 | Fill #0

## 2016-09-30 NOTE — Telephone Encounter (Signed)
° ° ° °  Pt request refill of the following:   gabapentin (NEURONTIN) 400 MG capsule  Phamacy:  [facility]

## 2016-09-30 NOTE — Telephone Encounter (Signed)
Ok to refill per last rx - 90 days, 1 rf. Thanks!

## 2016-09-30 NOTE — Telephone Encounter (Signed)
Per Dr Maudie Mercury Rx sent with instructions as per how the pt is taking this.

## 2016-10-02 ENCOUNTER — Other Ambulatory Visit: Payer: Self-pay | Admitting: Neurosurgery

## 2016-10-02 DIAGNOSIS — M9983 Other biomechanical lesions of lumbar region: Secondary | ICD-10-CM | POA: Diagnosis not present

## 2016-10-02 DIAGNOSIS — M5126 Other intervertebral disc displacement, lumbar region: Secondary | ICD-10-CM | POA: Diagnosis not present

## 2016-10-02 DIAGNOSIS — M5416 Radiculopathy, lumbar region: Secondary | ICD-10-CM | POA: Diagnosis not present

## 2016-10-02 DIAGNOSIS — M545 Low back pain: Secondary | ICD-10-CM | POA: Diagnosis not present

## 2016-10-03 ENCOUNTER — Telehealth: Payer: Self-pay | Admitting: Family Medicine

## 2016-10-03 DIAGNOSIS — M5441 Lumbago with sciatica, right side: Secondary | ICD-10-CM | POA: Diagnosis not present

## 2016-10-03 DIAGNOSIS — M9903 Segmental and somatic dysfunction of lumbar region: Secondary | ICD-10-CM | POA: Diagnosis not present

## 2016-10-03 DIAGNOSIS — M9905 Segmental and somatic dysfunction of pelvic region: Secondary | ICD-10-CM | POA: Diagnosis not present

## 2016-10-03 DIAGNOSIS — M9904 Segmental and somatic dysfunction of sacral region: Secondary | ICD-10-CM | POA: Diagnosis not present

## 2016-10-03 NOTE — Telephone Encounter (Signed)
FYI pt is having back surgery on 10-11-16

## 2016-10-04 NOTE — Telephone Encounter (Signed)
I called the pt and informed her of the message below

## 2016-10-04 NOTE — Telephone Encounter (Signed)
Please call her and let her know I hope it goes well and will keep her in my prayers.

## 2016-10-08 MED FILL — OXYCODONE/APAP 5/325 MG TAB: 5-325 | 15 days supply | Qty: 60 | Fill #0

## 2016-10-09 NOTE — Pre-Procedure Instructions (Signed)
[name]  10/09/2016      [place], [place] - [address] [place] [address] Phone: [phone] Fax: [phone]    Your procedure is scheduled on Fri, Feb 23 @ 11:45 AM  Report to [facility] Admitting at 8:45 AM  Call this number if you have problems the morning of surgery:  [phone]   Remember:  Do not eat food or drink liquids after midnight.  Take these medicines the morning of surgery with A SIP OF WATER Amlodipine(Norvasc) Gabapentin(Neurontin),Plaquenil(Hydroxychloroquine),Zantac(Ranitidine), and Metoprolol(Toprol)              Stop taking any Vitamins or Herbal Medications. No Goody's,BC's,Aleve,Advil,Motrin,Ibuprofen,or Fish Oil.      Do not wear jewelry, make-up or nail polish.  Do not wear lotions, powders,  perfumes, or deoderant.  Do not shave 48 hours prior to surgery.    Do not bring valuables to the hospital.  [facility] is not responsible for any belongings or valuables.  Contacts, dentures or bridgework may not be worn into surgery.  Leave your suitcase in the car.  After surgery it may be brought to your room.  For patients admitted to the hospital, discharge time will be determined by your treatment team.  Patients discharged the day of surgery will not be allowed to drive home.    [facility]  Before surgery, you can play an important role.  Because skin is not sterile, your skin needs to be as free of germs as possible.  You can reduce the number of germs on you skin by washing with CHG (chlorahexidine gluconate) soap before surgery.  CHG is an antiseptic cleaner which kills germs and bonds with the skin to continue killing germs even after washing.  Please DO NOT use if you have an allergy to CHG or antibacterial soaps.  If your skin becomes reddened/irritated stop using the CHG and inform your nurse when you arrive at  Short Stay.  Do not shave (including legs and underarms) for at least 48 hours prior to the first CHG shower.  You may shave your face.  Please follow these instructions carefully:   1.  Shower with CHG Soap the night before surgery and the                                morning of Surgery.  2.  If you choose to wash your hair, wash your hair first as usual with your       normal shampoo.  3.  After you shampoo, rinse your hair and body thoroughly to remove the                      Shampoo.  4.  Use CHG as you would any other liquid soap.  You can apply chg directly       to the skin and wash gently with scrungie or a clean washcloth.  5.  Apply the CHG Soap to your body ONLY FROM THE NECK DOWN.        Do not use on open wounds or open sores.  Avoid contact with your eyes,       ears, mouth and genitals (private parts).  Wash genitals (private parts)       with your normal soap.  6.  Wash thoroughly, paying  special attention to the area where your surgery        will be performed.  7.  Thoroughly rinse your body with warm water from the neck down.  8.  DO NOT shower/wash with your normal soap after using and rinsing off       the CHG Soap.  9.  Pat yourself dry with a clean towel.            10.  Wear clean pajamas.            11.  Place clean sheets on your bed the night of your first shower and do not        sleep with pets.  Day of Surgery  Do not apply any lotions/deoderants the morning of surgery.  Please wear clean clothes to the hospital/surgery center.   Please read over the following fact sheets that you were given. Pain Booklet, Coughing and Deep Breathing, MRSA Information and Surgical Site Infection Prevention

## 2016-10-10 ENCOUNTER — Encounter (HOSPITAL_COMMUNITY): Payer: Self-pay

## 2016-10-10 ENCOUNTER — Other Ambulatory Visit: Payer: Self-pay

## 2016-10-10 ENCOUNTER — Encounter (HOSPITAL_COMMUNITY)
Admission: RE | Admit: 2016-10-10 | Discharge: 2016-10-10 | Disposition: A | Discharge status: Home / Self Care | Payer: 59 | Source: Ambulatory Visit | Attending: Neurosurgery | Admitting: Neurosurgery

## 2016-10-10 DIAGNOSIS — Z7982 Long term (current) use of aspirin: Secondary | ICD-10-CM | POA: Diagnosis not present

## 2016-10-10 DIAGNOSIS — M48061 Spinal stenosis, lumbar region without neurogenic claudication: Secondary | ICD-10-CM

## 2016-10-10 DIAGNOSIS — Z01818 Encounter for other preprocedural examination: Secondary | ICD-10-CM | POA: Insufficient documentation

## 2016-10-10 DIAGNOSIS — K219 Gastro-esophageal reflux disease without esophagitis: Secondary | ICD-10-CM | POA: Diagnosis not present

## 2016-10-10 DIAGNOSIS — I10 Essential (primary) hypertension: Secondary | ICD-10-CM | POA: Diagnosis not present

## 2016-10-10 DIAGNOSIS — Z87891 Personal history of nicotine dependence: Secondary | ICD-10-CM | POA: Diagnosis not present

## 2016-10-10 DIAGNOSIS — M069 Rheumatoid arthritis, unspecified: Secondary | ICD-10-CM | POA: Diagnosis not present

## 2016-10-10 DIAGNOSIS — M48062 Spinal stenosis, lumbar region with neurogenic claudication: Secondary | ICD-10-CM | POA: Diagnosis not present

## 2016-10-10 DIAGNOSIS — M5117 Intervertebral disc disorders with radiculopathy, lumbosacral region: Secondary | ICD-10-CM | POA: Diagnosis not present

## 2016-10-10 DIAGNOSIS — M419 Scoliosis, unspecified: Secondary | ICD-10-CM | POA: Diagnosis not present

## 2016-10-10 HISTORY — DX: Other specified postprocedural states: R11.2

## 2016-10-10 HISTORY — DX: Other specified postprocedural states: Z98.890

## 2016-10-10 LAB — BASIC METABOLIC PANEL
Anion gap: 7 (ref 5–15)
BUN: 11 mg/dL (ref 6–20)
CO2: 26 mmol/L (ref 22–32)
Calcium: 10.7 mg/dL — ABNORMAL HIGH (ref 8.9–10.3)
Chloride: 101 mmol/L (ref 101–111)
Creatinine, Ser: 0.83 mg/dL (ref 0.44–1.00)
Glucose, Bld: 105 mg/dL — ABNORMAL HIGH (ref 65–99)
POTASSIUM: 3.8 mmol/L (ref 3.5–5.1)
SODIUM: 134 mmol/L — AB (ref 135–145)

## 2016-10-10 LAB — CBC
HCT: 39.8 % (ref 36.0–46.0)
Hemoglobin: 13.8 g/dL (ref 12.0–15.0)
MCH: 27.9 pg (ref 26.0–34.0)
MCHC: 34.7 g/dL (ref 30.0–36.0)
MCV: 80.4 fL (ref 78.0–100.0)
PLATELETS: 197 10*3/uL (ref 150–400)
RBC: 4.95 MIL/uL (ref 3.87–5.11)
RDW: 12.9 % (ref 11.5–15.5)
WBC: 6.1 10*3/uL (ref 4.0–10.5)

## 2016-10-10 LAB — SURGICAL PCR SCREEN
MRSA, PCR: NEGATIVE
Staphylococcus aureus: NEGATIVE

## 2016-10-10 LAB — TYPE AND SCREEN
ABO/RH(D): A POS
Antibody Screen: NEGATIVE

## 2016-10-10 LAB — ABO/RH: ABO/RH(D): A POS

## 2016-10-10 MED ORDER — CHLORHEXIDINE GLUCONATE CLOTH 2 % EX PADS: 6.0000 | MEDICATED_PAD | Freq: Once | CUTANEOUS | Status: DC

## 2016-10-10 NOTE — Patient Outreach (Signed)
[facility]) Care Management  10/10/2016  [name] [birth year] [id number]  Subjective: Telephone call to patient. Discussed UMR pre-op follow up. Client voices understanding and agrees to pre-op call.    Objective: per chart review- client to be admitted on 10/11/16 for L5-S1 maximum access posterior lumbar interbody fusion.  Assessment: received UMR pre-op call referral on 10/10/16. Pre op call completed. 66 year old with history of HTN, Rheumatoid Arthritis.   [name] states her husband is the [employer]. FMLA discussed with client. [name] reports that her son is there with her and her Husband will not need to take more than a day off as a result of her procedure. RNCM reinforced the benefits of having FMLA in place. [name] voiced understanding. Client reports she does not have short term/long term disability and has not worked in while due to her back issues. 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    Post procedure equipment/home health-Client reports she has a back brace provided prior to surgery. RNCM reinforced that equipment/home health will be arranged per inpatient case manager prior to discharge, if needed.   RNCM discussed [place] benefit is higher when using a  facility/pharmacy. [name] verbalized understanding. She reports she uses a [facility] [employer] routinely.  Support: Client reports she has transportation to her follow up appointment. She also reports that she has someone to obtain any new prescriptions at discharge  .  Discussed Advanced Directives. RNCM reinforced that Renaldo Harrison with the Spiritual care department available to assist [facility] employees with Advanced Directives as needed.  No other medical issues identified and no additional community resource information needs at this time.   Patient is agreeable post procedure call.  Thea Silversmith, RN, MSN, [place] Coordinator Cell:  [phone]

## 2016-10-10 NOTE — Telephone Encounter (Signed)
This encounter was created in error - please disregard.

## 2016-10-10 NOTE — Patient Outreach (Signed)
[name]) Care Management  10/10/2016  [name] 1950-10-16 [id number]  Subjective: none.  Objective: Per chart review, client with other biomechanical lesions of lumbar region is scheduled for L5-S1 maximun access posterior lumbar interbody fusion on 10/11/16. History of HTN, Rheumatiod Arthritis.   Assessment:  Received UMR Pre-surgical call referral on 10/10/16. Telephone call to patient's home / mobile number, no answer. HIPPA compliant voice message left on home number. Client's voice message is not set up on mobile number. Pre-surgical call pending patient contact.   Plan: RNCM will await return call.   Thea Silversmith, RN, MSN, [place] Coordinator Cell: [phone]

## 2016-10-11 ENCOUNTER — Inpatient Hospital Stay (HOSPITAL_COMMUNITY): Payer: 59 | Admitting: Certified Registered Nurse Anesthetist

## 2016-10-11 ENCOUNTER — Encounter (HOSPITAL_COMMUNITY): Payer: Self-pay | Admitting: Certified Registered Nurse Anesthetist

## 2016-10-11 ENCOUNTER — Encounter (HOSPITAL_COMMUNITY)
Admission: RE | Disposition: A | Discharge status: Home Health | Payer: Self-pay | Source: Ambulatory Visit | Attending: Neurosurgery

## 2016-10-11 ENCOUNTER — Inpatient Hospital Stay (HOSPITAL_COMMUNITY)
Admission: RE | Admit: 2016-10-11 | Discharge: 2016-10-14 | DRG: 460 | Disposition: A | Discharge status: Home Health | Payer: 59 | Source: Ambulatory Visit | Attending: Neurosurgery | Admitting: Neurosurgery

## 2016-10-11 ENCOUNTER — Inpatient Hospital Stay (HOSPITAL_COMMUNITY): Payer: 59

## 2016-10-11 ENCOUNTER — Ambulatory Visit: Payer: 59 | Admitting: Family Medicine

## 2016-10-11 DIAGNOSIS — E785 Hyperlipidemia, unspecified: Secondary | ICD-10-CM | POA: Diagnosis not present

## 2016-10-11 DIAGNOSIS — Z87891 Personal history of nicotine dependence: Secondary | ICD-10-CM

## 2016-10-11 DIAGNOSIS — M4326 Fusion of spine, lumbar region: Secondary | ICD-10-CM | POA: Diagnosis not present

## 2016-10-11 DIAGNOSIS — M5136 Other intervertebral disc degeneration, lumbar region: Secondary | ICD-10-CM | POA: Diagnosis not present

## 2016-10-11 DIAGNOSIS — M4807 Spinal stenosis, lumbosacral region: Secondary | ICD-10-CM | POA: Diagnosis not present

## 2016-10-11 DIAGNOSIS — Z7982 Long term (current) use of aspirin: Secondary | ICD-10-CM | POA: Diagnosis not present

## 2016-10-11 DIAGNOSIS — K219 Gastro-esophageal reflux disease without esophagitis: Secondary | ICD-10-CM | POA: Diagnosis present

## 2016-10-11 DIAGNOSIS — I10 Essential (primary) hypertension: Secondary | ICD-10-CM | POA: Diagnosis present

## 2016-10-11 DIAGNOSIS — M48062 Spinal stenosis, lumbar region with neurogenic claudication: Secondary | ICD-10-CM | POA: Diagnosis present

## 2016-10-11 DIAGNOSIS — M069 Rheumatoid arthritis, unspecified: Secondary | ICD-10-CM | POA: Diagnosis not present

## 2016-10-11 DIAGNOSIS — M4317 Spondylolisthesis, lumbosacral region: Secondary | ICD-10-CM | POA: Diagnosis not present

## 2016-10-11 DIAGNOSIS — M419 Scoliosis, unspecified: Secondary | ICD-10-CM | POA: Diagnosis present

## 2016-10-11 DIAGNOSIS — M5117 Intervertebral disc disorders with radiculopathy, lumbosacral region: Principal | ICD-10-CM | POA: Diagnosis present

## 2016-10-11 DIAGNOSIS — J309 Allergic rhinitis, unspecified: Secondary | ICD-10-CM | POA: Diagnosis not present

## 2016-10-11 DIAGNOSIS — M48061 Spinal stenosis, lumbar region without neurogenic claudication: Secondary | ICD-10-CM | POA: Diagnosis not present

## 2016-10-11 DIAGNOSIS — M4727 Other spondylosis with radiculopathy, lumbosacral region: Secondary | ICD-10-CM | POA: Diagnosis not present

## 2016-10-11 DIAGNOSIS — M549 Dorsalgia, unspecified: Secondary | ICD-10-CM | POA: Diagnosis present

## 2016-10-11 DIAGNOSIS — Z419 Encounter for procedure for purposes other than remedying health state, unspecified: Secondary | ICD-10-CM

## 2016-10-11 HISTORY — PX: MAXIMUM ACCESS (MAS)POSTERIOR LUMBAR INTERBODY FUSION (PLIF) 1 LEVEL: SHX6368

## 2016-10-11 SURGERY — FOR MAXIMUM ACCESS (MAS) POSTERIOR LUMBAR INTERBODY FUSION (PLIF) 1 LEVEL
Anesthesia: General | Site: Back

## 2016-10-11 MED ORDER — LACTATED RINGERS IV SOLN
INTRAVENOUS | Status: DC
  Administered 2016-10-11 (×2): via INTRAVENOUS

## 2016-10-11 MED ORDER — ACETAMINOPHEN 650 MG RE SUPP
650.0000 mg | Freq: Four times a day (QID) | RECTAL | Status: DC | PRN
Start: 2016-10-11 — End: 2016-10-14

## 2016-10-11 MED ORDER — METHOCARBAMOL 500 MG PO TABS
500.0000 mg | ORAL_TABLET | Freq: Four times a day (QID) | ORAL | Status: DC | PRN
Start: 2016-10-11 — End: 2016-10-14
  Administered 2016-10-12 – 2016-10-13 (×4): 500 mg via ORAL
  Filled 2016-10-11 (×6): qty 1

## 2016-10-11 MED ORDER — SODIUM CHLORIDE 0.9 % IV SOLN: 250.0000 mL | INTRAVENOUS | Status: DC

## 2016-10-11 MED ORDER — ZOLPIDEM TARTRATE 5 MG PO TABS: 5.0000 mg | ORAL_TABLET | Freq: Every evening | ORAL | Status: DC | PRN

## 2016-10-11 MED ORDER — ACETAMINOPHEN 650 MG RE SUPP: 650.0000 mg | RECTAL | Status: DC | PRN

## 2016-10-11 MED ORDER — BISACODYL 10 MG RE SUPP: 10.0000 mg | Freq: Every day | RECTAL | Status: DC | PRN

## 2016-10-11 MED ORDER — MENTHOL 3 MG MT LOZG
1.0000 | LOZENGE | OROMUCOSAL | Status: DC | PRN
  Filled 2016-10-11: qty 9

## 2016-10-11 MED ORDER — THROMBIN 20000 UNITS EX SOLR
CUTANEOUS | Status: DC | PRN
  Administered 2016-10-11: 14:00:00 via TOPICAL

## 2016-10-11 MED ORDER — PHENYLEPHRINE HCL 10 MG/ML IJ SOLN
INTRAMUSCULAR | Status: DC | PRN
  Administered 2016-10-11 (×2): 80 ug via INTRAVENOUS
  Administered 2016-10-11: 120 ug via INTRAVENOUS

## 2016-10-11 MED ORDER — HYDROMORPHONE HCL 1 MG/ML IJ SOLN
1.0000 mg | INTRAMUSCULAR | Status: DC | PRN
  Administered 2016-10-11: 1 mg via INTRAVENOUS
  Filled 2016-10-11: qty 1

## 2016-10-11 MED ORDER — SULFASALAZINE 500 MG PO TBEC
1000.0000 mg | DELAYED_RELEASE_TABLET | Freq: Two times a day (BID) | ORAL | Status: DC
  Administered 2016-10-12 – 2016-10-14 (×5): 1000 mg via ORAL
  Filled 2016-10-11 (×6): qty 2

## 2016-10-11 MED ORDER — DEXAMETHASONE SODIUM PHOSPHATE 10 MG/ML IJ SOLN
INTRAMUSCULAR | Status: AC
  Filled 2016-10-11: qty 1

## 2016-10-11 MED ORDER — HYDROMORPHONE HCL 1 MG/ML IJ SOLN
INTRAMUSCULAR | Status: AC
  Filled 2016-10-11: qty 0.5

## 2016-10-11 MED ORDER — HYDROXYCHLOROQUINE SULFATE 200 MG PO TABS
200.0000 mg | ORAL_TABLET | Freq: Every day | ORAL | Status: DC
  Administered 2016-10-12 – 2016-10-14 (×3): 200 mg via ORAL
  Filled 2016-10-11 (×3): qty 1

## 2016-10-11 MED ORDER — ONDANSETRON HCL 4 MG/2ML IJ SOLN
INTRAMUSCULAR | Status: AC
  Filled 2016-10-11: qty 2

## 2016-10-11 MED ORDER — SODIUM CHLORIDE 0.9% FLUSH
3.0000 mL | Freq: Two times a day (BID) | INTRAVENOUS | Status: DC
  Administered 2016-10-11: 3 mL via INTRAVENOUS

## 2016-10-11 MED ORDER — PROPOFOL 10 MG/ML IV BOLUS
INTRAVENOUS | Status: DC | PRN
Start: 2016-10-11 — End: 2016-10-11
  Administered 2016-10-11: 200 mg via INTRAVENOUS
  Administered 2016-10-11: 50 mg via INTRAVENOUS

## 2016-10-11 MED ORDER — PHENOL 1.4 % MT LIQD
1.0000 | OROMUCOSAL | Status: DC | PRN
  Administered 2016-10-12: 1 via OROMUCOSAL
  Filled 2016-10-11: qty 177

## 2016-10-11 MED ORDER — BIOTIN 2.5 MG PO TABS: ORAL_TABLET | Freq: Every day | ORAL | Status: DC

## 2016-10-11 MED ORDER — HYDROMORPHONE HCL 1 MG/ML IJ SOLN
0.2500 mg | INTRAMUSCULAR | Status: DC | PRN
  Administered 2016-10-11 (×2): 0.5 mg via INTRAVENOUS

## 2016-10-11 MED ORDER — HYDROCHLOROTHIAZIDE 25 MG PO TABS
25.0000 mg | ORAL_TABLET | Freq: Every day | ORAL | Status: DC
  Administered 2016-10-12: 25 mg via ORAL
  Filled 2016-10-11: qty 1

## 2016-10-11 MED ORDER — VITAMIN B-12 100 MCG PO TABS
100.0000 ug | ORAL_TABLET | Freq: Every day | ORAL | Status: DC
  Administered 2016-10-13: 100 ug via ORAL
  Filled 2016-10-11 (×3): qty 1

## 2016-10-11 MED ORDER — OXYCODONE HCL 5 MG PO TABS
5.0000 mg | ORAL_TABLET | ORAL | Status: DC | PRN
  Administered 2016-10-12: 5 mg via ORAL
  Administered 2016-10-12: 10 mg via ORAL
  Filled 2016-10-11 (×3): qty 2
  Filled 2016-10-11: qty 1

## 2016-10-11 MED ORDER — LOSARTAN POTASSIUM 50 MG PO TABS
100.0000 mg | ORAL_TABLET | Freq: Every day | ORAL | Status: DC
  Administered 2016-10-12 – 2016-10-14 (×2): 100 mg via ORAL
  Filled 2016-10-11 (×2): qty 2

## 2016-10-11 MED ORDER — ROCURONIUM BROMIDE 50 MG/5ML IV SOSY
PREFILLED_SYRINGE | INTRAVENOUS | Status: AC
  Filled 2016-10-11: qty 10

## 2016-10-11 MED ORDER — GUAIFENESIN ER 600 MG PO TB12: 600.0000 mg | ORAL_TABLET | Freq: Two times a day (BID) | ORAL | Status: DC | PRN

## 2016-10-11 MED ORDER — ASPIRIN 81 MG PO CHEW
81.0000 mg | CHEWABLE_TABLET | Freq: Every day | ORAL | Status: DC
  Administered 2016-10-12 – 2016-10-14 (×3): 81 mg via ORAL
  Filled 2016-10-11 (×3): qty 1

## 2016-10-11 MED ORDER — PHENYLEPHRINE 40 MCG/ML (10ML) SYRINGE FOR IV PUSH (FOR BLOOD PRESSURE SUPPORT)
PREFILLED_SYRINGE | INTRAVENOUS | Status: AC
  Filled 2016-10-11: qty 10

## 2016-10-11 MED ORDER — METOPROLOL SUCCINATE ER 100 MG PO TB24
100.0000 mg | ORAL_TABLET | Freq: Every day | ORAL | Status: DC
Start: 2016-10-12 — End: 2016-10-14
  Administered 2016-10-12 – 2016-10-13 (×2): 100 mg via ORAL
  Filled 2016-10-11 (×2): qty 1

## 2016-10-11 MED ORDER — BUPIVACAINE HCL (PF) 0.5 % IJ SOLN
INTRAMUSCULAR | Status: AC
  Filled 2016-10-11: qty 30

## 2016-10-11 MED ORDER — RED YEAST RICE 600 MG PO CAPS: ORAL_CAPSULE | Freq: Every day | ORAL | Status: DC

## 2016-10-11 MED ORDER — ONDANSETRON HCL 4 MG/2ML IJ SOLN
4.0000 mg | INTRAMUSCULAR | Status: DC | PRN
  Administered 2016-10-11 – 2016-10-13 (×4): 4 mg via INTRAVENOUS
  Filled 2016-10-11 (×4): qty 2

## 2016-10-11 MED ORDER — FENTANYL CITRATE (PF) 100 MCG/2ML IJ SOLN
INTRAMUSCULAR | Status: AC
  Filled 2016-10-11: qty 4

## 2016-10-11 MED ORDER — THROMBIN 20000 UNITS EX SOLR
CUTANEOUS | Status: AC
  Filled 2016-10-11: qty 20000

## 2016-10-11 MED ORDER — LIDOCAINE 2% (20 MG/ML) 5 ML SYRINGE
INTRAMUSCULAR | Status: AC
Start: 2016-10-11 — End: 2016-10-11
  Filled 2016-10-11: qty 5

## 2016-10-11 MED ORDER — LIDOCAINE-EPINEPHRINE 2 %-1:100000 IJ SOLN
INTRAMUSCULAR | Status: AC
  Filled 2016-10-11: qty 1

## 2016-10-11 MED ORDER — VITAMIN B-6 100 MG PO TABS
100.0000 mg | ORAL_TABLET | Freq: Every day | ORAL | Status: DC
  Administered 2016-10-12 – 2016-10-13 (×2): 100 mg via ORAL
  Filled 2016-10-11 (×4): qty 1

## 2016-10-11 MED ORDER — BUPIVACAINE LIPOSOME 1.3 % IJ SUSP
20.0000 mL | INTRAMUSCULAR | Status: AC
  Administered 2016-10-11: 20 mL
  Filled 2016-10-11: qty 20

## 2016-10-11 MED ORDER — PHENYLEPHRINE HCL 10 MG/ML IJ SOLN
INTRAMUSCULAR | Status: DC | PRN
  Administered 2016-10-11: 40 ug/min via INTRAVENOUS

## 2016-10-11 MED ORDER — FENTANYL CITRATE (PF) 100 MCG/2ML IJ SOLN
INTRAMUSCULAR | Status: DC | PRN
  Administered 2016-10-11 (×2): 50 ug via INTRAVENOUS

## 2016-10-11 MED ORDER — SCOPOLAMINE 1 MG/3DAYS TD PT72
MEDICATED_PATCH | TRANSDERMAL | Status: AC
  Filled 2016-10-11: qty 1

## 2016-10-11 MED ORDER — ARTIFICIAL TEARS OP OINT
TOPICAL_OINTMENT | OPHTHALMIC | Status: DC | PRN
  Administered 2016-10-11: 1 via OPHTHALMIC

## 2016-10-11 MED ORDER — MIDAZOLAM HCL 5 MG/5ML IJ SOLN
INTRAMUSCULAR | Status: DC | PRN
  Administered 2016-10-11: 2 mg via INTRAVENOUS

## 2016-10-11 MED ORDER — TURMERIC CURCUMIN PO CAPS: ORAL_CAPSULE | Freq: Every day | ORAL | Status: DC

## 2016-10-11 MED ORDER — EPHEDRINE 5 MG/ML INJ
INTRAVENOUS | Status: AC
  Filled 2016-10-11: qty 10

## 2016-10-11 MED ORDER — KCL IN DEXTROSE-NACL 20-5-0.45 MEQ/L-%-% IV SOLN
INTRAVENOUS | Status: DC
  Administered 2016-10-11 – 2016-10-12 (×3): via INTRAVENOUS
  Filled 2016-10-11 (×4): qty 1000

## 2016-10-11 MED ORDER — B COMPLEX-C PO TABS: 2.0000 | ORAL_TABLET | Freq: Every day | ORAL | Status: DC

## 2016-10-11 MED ORDER — ACETAMINOPHEN 325 MG PO TABS
650.0000 mg | ORAL_TABLET | ORAL | Status: DC | PRN
  Administered 2016-10-12 – 2016-10-14 (×9): 650 mg via ORAL
  Filled 2016-10-11 (×7): qty 2

## 2016-10-11 MED ORDER — CEFAZOLIN SODIUM-DEXTROSE 2-4 GM/100ML-% IV SOLN
INTRAVENOUS | Status: AC
  Filled 2016-10-11: qty 100

## 2016-10-11 MED ORDER — SUCCINYLCHOLINE CHLORIDE 200 MG/10ML IV SOSY
PREFILLED_SYRINGE | INTRAVENOUS | Status: DC | PRN
  Administered 2016-10-11: 60 mg via INTRAVENOUS
  Administered 2016-10-11: 100 mg via INTRAVENOUS

## 2016-10-11 MED ORDER — 0.9 % SODIUM CHLORIDE (POUR BTL) OPTIME
TOPICAL | Status: DC | PRN
  Administered 2016-10-11: 1000 mL

## 2016-10-11 MED ORDER — METHOCARBAMOL 1000 MG/10ML IJ SOLN
500.0000 mg | Freq: Four times a day (QID) | INTRAVENOUS | Status: DC | PRN
  Administered 2016-10-11: 500 mg via INTRAVENOUS
  Filled 2016-10-11 (×3): qty 5

## 2016-10-11 MED ORDER — EPHEDRINE SULFATE 50 MG/ML IJ SOLN
INTRAMUSCULAR | Status: DC | PRN
  Administered 2016-10-11: 5 mg via INTRAVENOUS
  Administered 2016-10-11: 10 mg via INTRAVENOUS

## 2016-10-11 MED ORDER — LIDOCAINE HCL (CARDIAC) 20 MG/ML IV SOLN
INTRAVENOUS | Status: DC | PRN
  Administered 2016-10-11: 100 mg via INTRAVENOUS

## 2016-10-11 MED ORDER — SODIUM CHLORIDE 0.9% FLUSH: 3.0000 mL | INTRAVENOUS | Status: DC | PRN

## 2016-10-11 MED ORDER — CEFAZOLIN SODIUM-DEXTROSE 2-4 GM/100ML-% IV SOLN
2.0000 g | Freq: Three times a day (TID) | INTRAVENOUS | Status: DC
  Filled 2016-10-11 (×2): qty 100

## 2016-10-11 MED ORDER — POLYETHYLENE GLYCOL 3350 17 G PO PACK: 17.0000 g | PACK | Freq: Every day | ORAL | Status: DC | PRN

## 2016-10-11 MED ORDER — LOSARTAN POTASSIUM-HCTZ 100-25 MG PO TABS: 1.0000 | ORAL_TABLET | Freq: Every day | ORAL | Status: DC

## 2016-10-11 MED ORDER — CEFAZOLIN SODIUM-DEXTROSE 2-4 GM/100ML-% IV SOLN
2.0000 g | INTRAVENOUS | Status: AC
  Administered 2016-10-11: 2 g via INTRAVENOUS

## 2016-10-11 MED ORDER — VITAMIN B-1 100 MG PO TABS
100.0000 mg | ORAL_TABLET | Freq: Every day | ORAL | Status: DC
  Administered 2016-10-13: 100 mg via ORAL
  Filled 2016-10-11 (×3): qty 1

## 2016-10-11 MED ORDER — PROPOFOL 10 MG/ML IV BOLUS
INTRAVENOUS | Status: AC
  Filled 2016-10-11: qty 20

## 2016-10-11 MED ORDER — DEXAMETHASONE SODIUM PHOSPHATE 10 MG/ML IJ SOLN
INTRAMUSCULAR | Status: DC | PRN
  Administered 2016-10-11: 10 mg via INTRAVENOUS

## 2016-10-11 MED ORDER — BUPIVACAINE HCL (PF) 0.5 % IJ SOLN
INTRAMUSCULAR | Status: DC | PRN
Start: 2016-10-11 — End: 2016-10-11
  Administered 2016-10-11: 5 mL

## 2016-10-11 MED ORDER — FLEET ENEMA 7-19 GM/118ML RE ENEM: 1.0000 | ENEMA | Freq: Once | RECTAL | Status: DC | PRN

## 2016-10-11 MED ORDER — DOCUSATE SODIUM 100 MG PO CAPS
100.0000 mg | ORAL_CAPSULE | Freq: Two times a day (BID) | ORAL | Status: DC
  Administered 2016-10-12 – 2016-10-14 (×5): 100 mg via ORAL
  Filled 2016-10-11 (×6): qty 1

## 2016-10-11 MED ORDER — FAMOTIDINE 10 MG PO TABS
10.0000 mg | ORAL_TABLET | Freq: Two times a day (BID) | ORAL | Status: DC
  Administered 2016-10-12 – 2016-10-14 (×5): 10 mg via ORAL
  Filled 2016-10-11 (×5): qty 1

## 2016-10-11 MED ORDER — AMLODIPINE BESYLATE 5 MG PO TABS
2.5000 mg | ORAL_TABLET | Freq: Every day | ORAL | Status: DC
  Administered 2016-10-12 – 2016-10-14 (×2): 2.5 mg via ORAL
  Filled 2016-10-11 (×2): qty 1

## 2016-10-11 MED ORDER — LIDOCAINE-EPINEPHRINE 2 %-1:100000 IJ SOLN
INTRAMUSCULAR | Status: DC | PRN
  Administered 2016-10-11: 5 mL

## 2016-10-11 MED ORDER — PROMETHAZINE HCL 25 MG/ML IJ SOLN: 6.2500 mg | INTRAMUSCULAR | Status: DC | PRN

## 2016-10-11 MED ORDER — SCOPOLAMINE 1 MG/3DAYS TD PT72SCOPOLAMINE 1 MG/3DAYS
MEDICATED_PATCH | TRANSDERMAL | Status: DC | PRN
Start: 2016-10-11 — End: 2016-10-11
  Administered 2016-10-11: 1 via TRANSDERMAL

## 2016-10-11 MED ORDER — SUCCINYLCHOLINE CHLORIDE 200 MG/10ML IV SOSY
PREFILLED_SYRINGE | INTRAVENOUS | Status: AC
  Filled 2016-10-11: qty 10

## 2016-10-11 MED ORDER — ACETAMINOPHEN 325 MG PO TABS
650.0000 mg | ORAL_TABLET | Freq: Four times a day (QID) | ORAL | Status: DC | PRN
Start: 2016-10-11 — End: 2016-10-14
  Filled 2016-10-11 (×2): qty 2

## 2016-10-11 MED ORDER — GABAPENTIN 100 MG PO CAPS
200.0000 mg | ORAL_CAPSULE | Freq: Two times a day (BID) | ORAL | Status: DC
  Administered 2016-10-11 – 2016-10-14 (×6): 200 mg via ORAL
  Filled 2016-10-11 (×7): qty 2

## 2016-10-11 MED ORDER — MIDAZOLAM HCL 2 MG/2ML IJ SOLN
INTRAMUSCULAR | Status: AC
  Filled 2016-10-11: qty 2

## 2016-10-11 MED ORDER — CEFAZOLIN SODIUM-DEXTROSE 2-4 GM/100ML-% IV SOLN
2.0000 g | Freq: Three times a day (TID) | INTRAVENOUS | Status: AC
  Administered 2016-10-11 – 2016-10-12 (×2): 2 g via INTRAVENOUS
  Filled 2016-10-11 (×2): qty 100

## 2016-10-11 SURGICAL SUPPLY — 93 items
ADH SKN CLS APL DERMABOND .7 (GAUZE/BANDAGES/DRESSINGS) ×1
BASKET BONE COLLECTION (BASKET) ×3 IMPLANT
BIT DRILL PLIF MAS DISP 5.5MM (DRILL) IMPLANT
BLADE CLIPPER SURG (BLADE) IMPLANT
BONE CANC CHIPS 20CC PCAN1/4 (Bone Implant) ×3 IMPLANT
BUR MATCHSTICK NEURO 3.0 LAGG (BURR) ×3 IMPLANT
BUR ROUND FLUTED 5 RND (BURR) ×2 IMPLANT
BUR ROUND FLUTED 5MM RND (BURR) ×1
CAGE COROENT LG 10X9X23-12 (Cage) ×4 IMPLANT
CANISTER SUCT 3000ML PPV (MISCELLANEOUS) ×3 IMPLANT
CAP RELINE MOD TULIP RMM (Cap) ×4 IMPLANT
CARTRIDGE OIL MAESTRO DRILL (MISCELLANEOUS) ×1 IMPLANT
CHIPS CANC BONE 20CC PCAN1/4 (Bone Implant) ×1 IMPLANT
CLIP NEUROVISION LG (CLIP) ×2 IMPLANT
CONT SPEC 4OZ CLIKSEAL STRL BL (MISCELLANEOUS) ×3 IMPLANT
COVER BACK TABLE 24X17X13 BIG (DRAPES) IMPLANT
COVER BACK TABLE 60X90IN (DRAPES) ×3 IMPLANT
DECANTER SPIKE VIAL GLASS SM (MISCELLANEOUS) ×3 IMPLANT
DERMABOND ADVANCED (GAUZE/BANDAGES/DRESSINGS) ×2
DERMABOND ADVANCED .7 DNX12 (GAUZE/BANDAGES/DRESSINGS) ×1 IMPLANT
DIFFUSER DRILL AIR PNEUMATIC (MISCELLANEOUS) ×3 IMPLANT
DRAPE C-ARM 42X72 X-RAY (DRAPES) ×3 IMPLANT
DRAPE C-ARMOR (DRAPES) ×3 IMPLANT
DRAPE LAPAROTOMY 100X72X124 (DRAPES) ×3 IMPLANT
DRAPE POUCH INSTRU U-SHP 10X18 (DRAPES) ×3 IMPLANT
DRAPE SURG 17X23 STRL (DRAPES) ×3 IMPLANT
DRILL PLIF MAS DISP 5.5MM (DRILL) ×3
DRSG OPSITE POSTOP 4X6 (GAUZE/BANDAGES/DRESSINGS) ×2 IMPLANT
DURAPREP 26ML APPLICATOR (WOUND CARE) ×3 IMPLANT
ELECT BLADE 4.0 EZ CLEAN MEGAD (MISCELLANEOUS) ×3
ELECT REM PT RETURN 9FT ADLT (ELECTROSURGICAL) ×3
ELECTRODE BLDE 4.0 EZ CLN MEGD (MISCELLANEOUS) IMPLANT
ELECTRODE REM PT RTRN 9FT ADLT (ELECTROSURGICAL) ×1 IMPLANT
EVACUATOR 1/8 PVC DRAIN (DRAIN) ×1 IMPLANT
GAUZE SPONGE 4X4 12PLY STRL (GAUZE/BANDAGES/DRESSINGS) ×1 IMPLANT
GAUZE SPONGE 4X4 16PLY XRAY LF (GAUZE/BANDAGES/DRESSINGS) IMPLANT
GLOVE BIO SURGEON STRL SZ8 (GLOVE) ×5 IMPLANT
GLOVE BIOGEL PI IND STRL 7.0 (GLOVE) IMPLANT
GLOVE BIOGEL PI IND STRL 7.5 (GLOVE) IMPLANT
GLOVE BIOGEL PI IND STRL 8 (GLOVE) ×1 IMPLANT
GLOVE BIOGEL PI IND STRL 8.5 (GLOVE) ×1 IMPLANT
GLOVE BIOGEL PI INDICATOR 7.0 (GLOVE) ×2
GLOVE BIOGEL PI INDICATOR 7.5 (GLOVE) ×2
GLOVE BIOGEL PI INDICATOR 8 (GLOVE) ×4
GLOVE BIOGEL PI INDICATOR 8.5 (GLOVE) ×4
GLOVE ECLIPSE 8.0 STRL XLNG CF (GLOVE) ×7 IMPLANT
GLOVE EXAM NITRILE LRG STRL (GLOVE) IMPLANT
GLOVE EXAM NITRILE XL STR (GLOVE) ×2 IMPLANT
GLOVE EXAM NITRILE XS STR PU (GLOVE) IMPLANT
GLOVE SS BIOGEL STRL SZ 7.5 (GLOVE) IMPLANT
GLOVE SUPERSENSE BIOGEL SZ 7.5 (GLOVE)
GLOVE SURG SS PI 7.0 STRL IVOR (GLOVE) ×6 IMPLANT
GOWN STRL REUS W/ TWL LRG LVL3 (GOWN DISPOSABLE) IMPLANT
GOWN STRL REUS W/ TWL XL LVL3 (GOWN DISPOSABLE) ×1 IMPLANT
GOWN STRL REUS W/TWL 2XL LVL3 (GOWN DISPOSABLE) ×5 IMPLANT
GOWN STRL REUS W/TWL LRG LVL3 (GOWN DISPOSABLE) ×3
GOWN STRL REUS W/TWL XL LVL3 (GOWN DISPOSABLE) ×9
GRAFT BNE CANC CHIPS 1-8 20CC (Bone Implant) IMPLANT
KIT BASIN OR (CUSTOM PROCEDURE TRAY) ×3 IMPLANT
KIT POSITION SURG JACKSON T1 (MISCELLANEOUS) ×3 IMPLANT
KIT ROOM TURNOVER OR (KITS) ×3 IMPLANT
MARKER SKIN DUAL TIP RULER LAB (MISCELLANEOUS) ×2 IMPLANT
MODULE NVM5 NEXT GEN EMG (NEEDLE) ×2 IMPLANT
NDL HYPO 21X1.5 SAFETY (NEEDLE) IMPLANT
NDL HYPO 25X1 1.5 SAFETY (NEEDLE) ×1 IMPLANT
NDL SPNL 18GX3.5 QUINCKE PK (NEEDLE) IMPLANT
NEEDLE HYPO 21X1.5 SAFETY (NEEDLE) ×3 IMPLANT
NEEDLE HYPO 25X1 1.5 SAFETY (NEEDLE) ×3 IMPLANT
NEEDLE SPNL 18GX3.5 QUINCKE PK (NEEDLE) ×3 IMPLANT
NS IRRIG 1000ML POUR BTL (IV SOLUTION) ×3 IMPLANT
OIL CARTRIDGE MAESTRO DRILL (MISCELLANEOUS) ×3
PACK LAMINECTOMY NEURO (CUSTOM PROCEDURE TRAY) ×3 IMPLANT
PAD ARMBOARD 7.5X6 YLW CONV (MISCELLANEOUS) ×7 IMPLANT
PATTIES SURGICAL .5 X.5 (GAUZE/BANDAGES/DRESSINGS) IMPLANT
PATTIES SURGICAL .5 X1 (DISPOSABLE) IMPLANT
PATTIES SURGICAL 1X1 (DISPOSABLE) IMPLANT
ROD RELINE COCR LORD 5.0X35 (Rod) ×4 IMPLANT
SCREW LOCK RSS 4.5/5.0MM (Screw) ×8 IMPLANT
SCREW RELINE RMM 6.5X35 4S (Screw) ×4 IMPLANT
SCREW SHANK RELINE MOD 5.5X35 (Screw) ×4 IMPLANT
SPONGE LAP 4X18 X RAY DECT (DISPOSABLE) IMPLANT
SPONGE SURGIFOAM ABS GEL 100 (HEMOSTASIS) ×3 IMPLANT
STAPLER SKIN PROX WIDE 3.9 (STAPLE) IMPLANT
SUT VIC AB 1 CT1 18XBRD ANBCTR (SUTURE) ×2 IMPLANT
SUT VIC AB 1 CT1 8-18 (SUTURE) ×3
SUT VIC AB 2-0 CT1 18 (SUTURE) ×4 IMPLANT
SUT VIC AB 3-0 SH 8-18 (SUTURE) ×4 IMPLANT
SYR 20CC LL (SYRINGE) ×2 IMPLANT
SYR 5ML LL (SYRINGE) IMPLANT
TOWEL OR 17X24 6PK STRL BLUE (TOWEL DISPOSABLE) ×3 IMPLANT
TOWEL OR 17X26 10 PK STRL BLUE (TOWEL DISPOSABLE) ×3 IMPLANT
TRAY FOLEY W/METER SILVER 16FR (SET/KITS/TRAYS/PACK) ×3 IMPLANT
WATER STERILE IRR 1000ML POUR (IV SOLUTION) ×3 IMPLANT

## 2016-10-11 NOTE — Transfer of Care (Signed)
Immediate Anesthesia Transfer of Care Note  Patient: [name]  Procedure(s) Performed: Procedure(s): Lumbar one-Sacral one Maximum access posterior lumbar interbody fusion (N/A)  Patient Location: PACU  Anesthesia Type:General  Level of Consciousness: awake, alert  and oriented  Airway & Oxygen Therapy: Patient Spontanous Breathing and Patient connected to nasal cannula oxygen  Post-op Assessment: Report given to RN and Post -op Vital signs reviewed and stable  Post vital signs: Reviewed and stable  Last Vitals:  Vitals:   10/11/16 0856 10/11/16 1600  BP: (!) 147/68   Pulse: 68   Resp: 18   Temp: 36.9 C (P) 36.1 C    Last Pain:  Vitals:   10/11/16 0856  TempSrc: Oral         Complications: No apparent anesthesia complications

## 2016-10-11 NOTE — Progress Notes (Signed)
Pt received from PACU drowsy with no noted distress. Pt stable, neuro intact. Surgical honeycomb dressing clean, dry and intact. Pt oriented to room. Safety measures in place. Pt complaint of nausea, administered prn Zofran for comfort. Family at bedside. Call bell within reach.

## 2016-10-11 NOTE — Anesthesia Preprocedure Evaluation (Addendum)
Anesthesia Evaluation  Patient identified by MRN, date of birth, ID band Patient awake    Reviewed: Allergy & Precautions, NPO status , Patient's Chart, lab work & pertinent test results  History of Anesthesia Complications (+) PONV and history of anesthetic complications  Airway Mallampati: II  TM Distance: >3 FB Neck ROM: Full    Dental  (+) Teeth Intact   Pulmonary former smoker,    breath sounds clear to auscultation       Cardiovascular hypertension,  Rhythm:Regular Rate:Normal     Neuro/Psych    GI/Hepatic Neg liver ROS, GERD  ,  Endo/Other    Renal/GU negative Renal ROS     Musculoskeletal  (+) Arthritis ,   Abdominal (+) + obese,   Peds  Hematology   Anesthesia Other Findings   Reproductive/Obstetrics                             Anesthesia Physical Anesthesia Plan  ASA: II  Anesthesia Plan: General   Post-op Pain Management:    Induction: Intravenous  Airway Management Planned: Oral ETT  Additional Equipment:   Intra-op Plan:   Post-operative Plan: Extubation in OR  Informed Consent: I have reviewed the patients History and Physical, chart, labs and discussed the procedure including the risks, benefits and alternatives for the proposed anesthesia with the patient or authorized representative who has indicated his/her understanding and acceptance.   Dental advisory given  Plan Discussed with: CRNA  Anesthesia Plan Comments:         Anesthesia Quick Evaluation

## 2016-10-11 NOTE — Brief Op Note (Signed)
10/11/2016  3:47 PM  PATIENT:  [name]  66 y.o. female  PRE-OPERATIVE DIAGNOSIS:  Lumbar foraminal stenosis, spondylolisthesis, herniated lumbar disc, lumbago, lumbar radiculopathy L 5 S 1   POST-OPERATIVE DIAGNOSIS:   Lumbar foraminal stenosis, spondylolisthesis, herniated lumbar disc, lumbago, lumbar radiculopathy L 5 S 1    PROCEDURE:  Procedure(s): Lumbar one-Sacral one Maximum access posterior lumbar interbody fusion (N/A) with PEEK cages, posterolateral arthrodesis  Decompression greater than for standard PLIF procedure  SURGEON:  Surgeon(s) and Role:    * Erline Levine, MD - Primary    * Kevan Ny Ditty, MD - Assisting  PHYSICIAN ASSISTANT:   ASSISTANTS: P[place], RN   ANESTHESIA:   general  EBL:  Total I/O In: 1000 [I.V.:1000] Out: 35 [Urine:240; Blood:300]  BLOOD ADMINISTERED:none  DRAINS: none   LOCAL MEDICATIONS USED:  MARCAINE    and LIDOCAINE   SPECIMEN:  No Specimen  DISPOSITION OF SPECIMEN:  N/A  COUNTS:  YES  TOURNIQUET:  * No tourniquets in log *  DICTATION: Patient is a 66 year old female with spondylosis , stenosis, spondylolisthesis, disc herniation and severe back and right greater than left lower extremity pain at L 5 S 1  level of the lumbar spine. It was elected to take her to surgery for MASPLIF L 5 S 1 level with posterolateral arthrodesis.  Procedure:   Following uncomplicated induction of GETA, and placement of electrodes for neural monitoring, patient was turned into a prone position on the [place] tableand using AP  fluoroscopy the area of planned incision was marked, prepped with betadine scrub and Duraprep, then draped. Exposure was performed of facet joint complex at L 5 S 1 level and the MAS retractor was placed.5.5 x 35 mm cortical Nuvasive screws were placed at L 5 bilaterally according to standard landmarks using neural monitoring.  A total laminectomy of L 5 was then performed with disarticulation of facets.  This bone  was saved for grafting, combined with allograft chips after being run through bone mill and was placed in bone packing device.  Thorough discectomy was performed bilaterally at L 5 S 1 and the endplates were prepared for grafting.  The facet joints were severely overgrown and dense amounts of hypertrophied bone and ligament were adherent to the nerve roots and thecal sac.  These were painstakingly decompressed under Loupe magnification and with microinstruments.  Decompression was greater than for standard PLIF procedure.  23 x 10 x 12 degree cages were placed in the interspace and positioning was confirmed with AP and lateral fluoroscopy.  10 cc of autograft was packed in the interspace medial to the second cage.   Remaining screws were placed at S 1 and 35 mm rods were placed.   And the screws were locked and torqued. Final Xrays showed well positioned implants and screw fixation. The posterolateral region was packed with remaining 15 cc of autograft on the right of midline. The wounds were irrigated and then closed with 1, 2-0 and 3-0 Vicryl stitches. Sterile occlusive dressing was placed with Dermabond and an occlusive dressing. The patient was then extubated in the operating room and taken to recovery in stable and satisfactory condition having tolerated her operation well. Counts were correct at the end of the case.  PLAN OF CARE: Admit to inpatient   PATIENT DISPOSITION:  PACU - hemodynamically stable.   Delay start of Pharmacological VTE agent (>24hrs) due to surgical blood loss or risk of bleeding: yes

## 2016-10-11 NOTE — Progress Notes (Signed)
Awake, alert, conversant.  MAEW with good strength.

## 2016-10-11 NOTE — Op Note (Signed)
10/11/2016  3:47 PM  PATIENT:  [name]  66 y.o. female  PRE-OPERATIVE DIAGNOSIS:  Lumbar foraminal stenosis, spondylolisthesis, herniated lumbar disc, lumbago, lumbar radiculopathy L 5 S 1   POST-OPERATIVE DIAGNOSIS:   Lumbar foraminal stenosis, spondylolisthesis, herniated lumbar disc, lumbago, lumbar radiculopathy L 5 S 1    PROCEDURE:  Procedure(s): Lumbar one-Sacral one Maximum access posterior lumbar interbody fusion (N/A) with PEEK cages, posterolateral arthrodesis  Decompression greater than for standard PLIF procedure  SURGEON:  Surgeon(s) and Role:    * Erline Levine, MD - Primary    * Kevan Ny Ditty, MD - Assisting  PHYSICIAN ASSISTANT:   ASSISTANTS: Poteat, RN   ANESTHESIA:   general  EBL:  Total I/O In: 1000 [I.V.:1000] Out: 69 [Urine:240; Blood:300]  BLOOD ADMINISTERED:none  DRAINS: none   LOCAL MEDICATIONS USED:  MARCAINE    and LIDOCAINE   SPECIMEN:  No Specimen  DISPOSITION OF SPECIMEN:  N/A  COUNTS:  YES  TOURNIQUET:  * No tourniquets in log *  DICTATION: Patient is a 66 year old female with spondylosis , stenosis, spondylolisthesis, disc herniation and severe back and right greater than left lower extremity pain at L 5 S 1  level of the lumbar spine. It was elected to take her to surgery for MASPLIF L 5 S 1 level with posterolateral arthrodesis.  Procedure:   Following uncomplicated induction of GETA, and placement of electrodes for neural monitoring, patient was turned into a prone position on the Coleta tableand using AP  fluoroscopy the area of planned incision was marked, prepped with betadine scrub and Duraprep, then draped. Exposure was performed of facet joint complex at L 5 S 1 level and the MAS retractor was placed.5.5 x 35 mm cortical Nuvasive screws were placed at L 5 bilaterally according to standard landmarks using neural monitoring.  A total laminectomy of L 5 was then performed with disarticulation of facets.  This bone  was saved for grafting, combined with allograft chips after being run through bone mill and was placed in bone packing device.  Thorough discectomy was performed bilaterally at L 5 S 1 and the endplates were prepared for grafting.  The facet joints were severely overgrown and dense amounts of hypertrophied bone and ligament were adherent to the nerve roots and thecal sac.  These were painstakingly decompressed under Loupe magnification and with microinstruments.  Decompression was greater than for standard PLIF procedure.  23 x 10 x 12 degree cages were placed in the interspace and positioning was confirmed with AP and lateral fluoroscopy.  10 cc of autograft was packed in the interspace medial to the second cage.   Remaining screws were placed at S 1 and 35 mm rods were placed.   And the screws were locked and torqued. Final Xrays showed well positioned implants and screw fixation. The posterolateral region was packed with remaining 15 cc of autograft on the right of midline. The wounds were irrigated and then closed with 1, 2-0 and 3-0 Vicryl stitches. Sterile occlusive dressing was placed with Dermabond and an occlusive dressing. The patient was then extubated in the operating room and taken to recovery in stable and satisfactory condition having tolerated her operation well. Counts were correct at the end of the case.  PLAN OF CARE: Admit to inpatient   PATIENT DISPOSITION:  PACU - hemodynamically stable.   Delay start of Pharmacological VTE agent (>24hrs) due to surgical blood loss or risk of bleeding: yes

## 2016-10-11 NOTE — Anesthesia Postprocedure Evaluation (Signed)
Anesthesia Post Note  Patient: [name]  Procedure(s) Performed: Procedure(s) (LRB): Lumbar one-Sacral one Maximum access posterior lumbar interbody fusion (N/A)  Patient location during evaluation: PACU Anesthesia Type: General Level of consciousness: awake and alert and oriented Pain management: pain level controlled Vital Signs Assessment: post-procedure vital signs reviewed and stable Respiratory status: spontaneous breathing, nonlabored ventilation and respiratory function stable Cardiovascular status: blood pressure returned to baseline and stable Postop Assessment: no signs of nausea or vomiting Anesthetic complications: no       Last Vitals:  Vitals:   10/11/16 1646 10/11/16 1658  BP: 133/65   Pulse: (!) 55   Resp: 10   Temp:  36.1 C    Last Pain:  Vitals:   10/11/16 1638  TempSrc:   PainSc: 10-Worst pain ever    LLE Motor Response: Purposeful movement (10/11/16 1658) LLE Sensation: Full sensation;No numbness;No pain;No tingling (10/11/16 1658) RLE Motor Response: Purposeful movement (10/11/16 1658) RLE Sensation: Full sensation;No numbness;No pain;No tingling (10/11/16 1658)      Adlai Nieblas A.

## 2016-10-11 NOTE — Interval H&P Note (Signed)
History and Physical Interval Note:  10/11/2016 12:03 PM  [name]  has presented today for surgery, with the diagnosis of Lumbar foraminal stenosis  The various methods of treatment have been discussed with the patient and family. After consideration of risks, benefits and other options for treatment, the patient has consented to  Procedure(s) with comments: L5-S1 Maximum access posterior lumbar interbody fusion (N/A) - L5-S1 Maximum access posterior lumbar interbody fusion as a surgical intervention .  The patient's history has been reviewed, patient examined, no change in status, stable for surgery.  I have reviewed the patient's chart and labs.  Questions were answered to the patient's satisfaction.     [name] D

## 2016-10-11 NOTE — Anesthesia Procedure Notes (Signed)
Procedure Name: Intubation Date/Time: 10/11/2016 12:58 PM Performed by: Candis Shine Pre-anesthesia Checklist: Patient identified, Emergency Drugs available, Suction available and Patient being monitored Patient Re-evaluated:Patient Re-evaluated prior to inductionOxygen Delivery Method: Circle System Utilized Preoxygenation: Pre-oxygenation with 100% oxygen Intubation Type: IV induction Ventilation: Mask ventilation without difficulty Laryngoscope Size: Miller and 2 Tube type: Oral Tube size: 7.0 mm Number of attempts: 3 Airway Equipment and Method: Stylet and Oral airway Placement Confirmation: ETT inserted through vocal cords under direct vision,  positive ETCO2 and breath sounds checked- equal and bilateral Secured at: 22 cm Tube secured with: Tape Dental Injury: Teeth and Oropharynx as per pre-operative assessment  Comments: DL x 1 by CRNA with Mac 3, grade II view, unable to pass ETT. DL x 1 by Dr. Orene Desanctis with Sabra Heck 2, pt no longer relaxed, more propofol and succs given, DL again by Dr. Orene Desanctis, 7.0 ETT placed atraumatically.

## 2016-10-11 NOTE — H&P (Signed)
Patient ID:   [id number] Patient: [name]  Date of Birth: 1951-05-20 Visit Type: Office Visit   Date: 10/02/2016 11:30 AM Provider: Marchia Meiers. Vertell Limber MD   This 66 year old female presents for back pain.  History of Present Illness: 1.  back pain    [name], 66 year old female visits for evaluation of back and bilateral leg pain.  She recalls twisting her ankle while walking last March.  This seems to have exacerbated her longstanding low back discomfort.  She finds sitting and walking painful.  Her pain is usually 5/10 and is a 10/10 its worst.  ESI offered no relief Chiropractic offers only short periods of relief  Motrin 1 b.i.d.-t.i.d. Flexeril 5 mg only taken once, no help Aleve only as needed Gabapentin 200 mg b.i.d. for neuropathy (she did not tolerate higher doses)  History:  HTN, cholesterol, RA, GERD Surgical history:  Inner ear surgery 2007, cataract 2013  MRI June 2017 and x-ray on [facility]  10/02/16  MRI: Severe facet arthrosis at L5-S1 with grade 1 anterolisthesis and moderate right lateral recess stenosis, potentially affecting the right S1 nerve.  Mild right and moderate left lateral recess stenosis at L4-5.  Moderate left lateral recess stenosis at L3-4.  X-rays show narrowing at left L3-4, mild arthritis at L4-5, severe arthritis at L5-S1 with L5 nerve impingement on the right, and L5-S1 retrolisthesis in extension.        PAST MEDICAL/SURGICAL HISTORY   (Detailed)  Disease/disorder Onset Date Management Date Comments  Arthritis      High cholesterol      Hypertension         Family History  (Detailed)   SOCIAL HISTORY  (Detailed) Tobacco use reviewed. Preferred language is Unknown.   Smoking status: Never smoker.  SMOKING STATUS Use Status Type Smoking Status Usage Per Day Years Used Total Pack Years  no/never  Never smoker             MEDICATIONS(added, continued or stopped this visit): Started Medication Directions  Instruction Stopped   amlodipine 5 mg tablet take 1/2  tablet by oral route  every day     aspirin 81 mg tablet,delayed release take 1 tablet by oral route  every day     biotin 5,000 mcg disintegrating tablet Take daily as directed     cyclobenzaprine 5 mg tablet take 1 tablet by oral route  every day     gabapentin 400 mg capsule take 1 capsule by oral route 3 times every day     guaifenesin ER 600 mg tablet, extended release 12 hr take 1 tablet by oral route  every 12 hours as needed     hydroxychloroquine 200 mg tablet take 1 tablet by oral route  every day     loratadine 10 mg tablet take 1 tablet by oral route  every day     losartan 100 mg-hydrochlorothiazide 25 mg tablet take 1 tablet by oral route  every day     metoprolol succinate ER 50 mg tablet,extended release 24 hr take 2 tablet by oral route  every day     naproxen sodium 220 mg capsule take 2 capsule by oral route 2 times every day     pyridoxine (vitamin B6) 100 mg tablet Take 1 tablet daily     ranitidine 150 mg tablet take 1 tablet by oral route  every day     sulfasalazine 500 mg tablet take 2 tablet by oral route 2 times every day  after meals     thiamine HCl (vitamin B1) 100 mg tablet Take 1 tablet daily     Vitamin B Complex With C capsule Take 1 tablet daily     vitamin b12  ORAL        ALLERGIES: Ingredient Reaction Medication Name Comment  NO KNOWN ALLERGIES     No known allergies.    Vitals Date Temp F BP Pulse Ht In Wt Lb BMI BSA Pain Score  10/02/2016  172/83 66 66 231 37.28  5/10     PHYSICAL EXAM General Level of Distress: no acute distress Overall Appearance: normal    Cardiovascular Cardiac: regular rate and rhythm without murmur  Respiratory Lungs: clear to auscultation  Neurological Recent and Remote Memory: normal Attention Span and Concentration:   normal Language: normal Fund of Knowledge: normal  Right Left Sensation: normal normal Upper Extremity  Coordination: normal normal  Lower Extremity Coordination: normal normal  Musculoskeletal Gait and Station: normal  Right Left Upper Extremity Muscle Strength: normal normal Lower Extremity Muscle Strength: normal normal Upper Extremity Muscle Tone:  normal normal Lower Extremity Muscle Tone: normal normal  Motor Strength Upper and lower extremity motor strength was tested in the clinically pertinent muscles. Any abnormal findings will be noted below.   Right Left EHL: 4/5    Deep Tendon Reflexes  Right Left Biceps: normal normal Triceps: normal normal Brachioradialis: normal normal Patellar: normal normal Achilles: normal normal  Sensory Sensation was tested at L1 to S1.   Cranial Nerves II. Optic Nerve/Visual Fields: normal III. Oculomotor: normal IV. Trochlear: normal V. Trigeminal: normal VI. Abducens: normal VII. Facial: normal VIII. Acoustic/Vestibular: normal IX. Glossopharyngeal: normal X. Vagus: normal XI. Spinal Accessory: normal XII. Hypoglossal: normal  Motor and other Tests Lhermittes: negative Rhomberg: negative    Right Left Hoffman's: normal normal Clonus: normal normal Babinski: normal normal SLR: negative negative Patrick's Corky Sox): negative negative Toe Walk: normal normal Toe Lift: normal normal Heel Walk: normal normal SI Joint: nontender nontender   Additional Findings:  Feet are extremely red/warm, positive sciatic notch discomfort on the right, toe touch 8 inches, positive seated SLR on the right, intact pinprick bilaterally, normal stocking distribution, and decreased proprioception and vibration at malleolus, and right S1-2 paraesthesia.   DIAGNOSTIC RESULTS 10/02/16 MRI: Severe facet arthrosis at L5-S1 with grade 1 anterolisthesis and moderate right lateral recess stenosis, potentially affecting the right S1 nerve.  Mild right and moderate left lateral recess stenosis at L4-5.  Moderate left lateral recess stenosis at  L3-4.    IMPRESSION The patient comes in with long-lasting low back and bilateral leg pain, right> left, that was exacerbated a year ago due to a slip.  She described the right leg pain as a "shooting pain" down to her knee.  She denies any weakness and states that injections have provided no relief.  She notes that she "can't do anything" due to her symptoms which has resulted in weight gain.  Additionally, she reports neuropathy bilaterally in her feet.  She states her feet begin to tingle when standing.  Her MRI and X-ray show narrowing at left L3-4, mild arthritis at L4-5 left>right, severe arthritis at L5-S1 with L5 nerve impingement on the right, and L5-S1 retrolisthesis in extension.  On confrontational testing, both feet are extremely red/warm, 4/5 right EHL, positive sciatic notch discomfort on the right, toe touch 8 inches, positive seated SLR on the right, intact pinprick bilaterally, normal stocking distribution, and decreased proprioception and vibration at malleolus, and  right S1-2 paraesthesia.  We discussed the importance of weight loss in alleviating her symptoms.  I believe surgical intervention is warranted at this time.  Schedule L5-S1 decompression and fusion MAS PLIF.  Completed Orders (this encounter) Order Details Reason Side Interpretation Result Initial Treatment Date Region  Scoliosis- AP/Lat 1 of 2     10/02/2016 All Levels to All Levels  Lumbar Spine- AP/Lat/Flex/Ex 2 of 2     10/02/2016 All Levels to All Levels   Assessment/Plan # Detail Type Description   1. Assessment Lumbar radiculopathy (M54.16).         Pain Assessment/Treatment Pain Scale: 5/10. Method: Numeric Pain Intensity Scale. Location: back. Onset: 10/31/2015. Duration: varies. Quality: discomforting. Pain Assessment/Treatment follow-up plan of care: Patient is taking medications as prescribed..  Schedule L5-S1 decompression and fusion MAS PLIF.  Nurse education given.  Fit for LSO  brace.  Orders: Diagnostic Procedures: Assessment Procedure  M54.16 Lumbar Spine- AP/Lat/Flex/Ex  M54.16 Scoliosis- AP/Lat             Provider:  Marchia Meiers. Vertell Limber MD  10/02/2016 02:13 PM Dictation edited by: Daine Gravel    CC Providers: [facility] [address] [place], [place] [id number]              Electronically signed by Marchia Meiers. Vertell Limber MD on 10/06/2016 02:47 PM

## 2016-10-12 NOTE — Care Management Note (Signed)
Case Management Note  Patient Details  Name: [name] MRN: [id number] Date of Birth: Apr 08, 1951  Subjective/Objective:                 Patient from home, admitted for:  Lumbar foraminal stenosis, spondylolisthesis, herniated lumbar disc, lumbago, lumbar radiculopathy L 5 S 1  PROCEDURE:  Procedure(s): Lumbar one-Sacral one Maximum access posterior lumbar interbody fusion (N/A) with PEEK cages, posterolateral arthrodesis Decompression greater than for standard PLIF procedure. Plan for patient to work with PT today.    Action/Plan:  CM will continue to follow for DC needs.  Expected Discharge Date:                  Expected Discharge Plan:     In-House Referral:  Clinical Social Work  Discharge planning Services  CM Consult  Post Acute Care Choice:    Choice offered to:     DME Arranged:    DME Agency:     HH Arranged:    HH Agency:     Status of Service:  In process, will continue to follow  If discussed at Long Length of Stay Meetings, dates discussed:    Additional Comments:  Carles Collet, RN 10/12/2016, 10:42 AM

## 2016-10-12 NOTE — Progress Notes (Signed)
Patient ID: [name], female   DOB: 1951/06/28, 66 y.o.   MRN: [id number] Patient reports more neck pain and ear fullness and sore throat than low back pain or leg pain. Symptoms in her feet preoperatively are gone. Moves legs well. She does have a headache. Checkout report did not mention spinal fluid leak but it is certainly in the differential, though it is not highly suspected. Mobilize today with therapy if she can tolerate it. Continue pain control.

## 2016-10-12 NOTE — Evaluation (Signed)
Occupational Therapy Evaluation Patient Details Name: [name] MRN: [id number] DOB: 13-Feb-1951 Today's Date: 10/12/2016    History of Present Illness This 66 y.o. female admitted for L1-S1 maximum access PLIF with PEEK cages, and posterolateral arthrodesis.  PMH includes: RA, pseudogout foot, HTN   Clinical Impression   Pt admitted with above. She demonstrates the below listed deficits and will benefit from continued OT to maximize safety and independence with BADLs.  Eval limited to EOB sitting today due to onset of "chills" with extreme shivering/shaking, hyperventilation with inability to progress activity despite ~25 mins of attempting to manage symptoms.  Pt indicates Pain precipitates the "chills".  She reported pain to be 3-5/10 during eval.   She currently requires max - total A for ADLs.   She lives in 2 story home with bed and bath upstairs.  Based on eval anticipate she will need SNF level rehab prior to return home.  Will follow.       Follow Up Recommendations  SNF;Supervision/Assistance - 24 hour    Equipment Recommendations  3 in 1 bedside commode;Tub/shower bench    Recommendations for Other Services       Precautions / Restrictions Precautions Precautions: Back;Fall Precaution Booklet Issued: No Precaution Comments: Pt instructed in back precautions       Mobility Bed Mobility Overal bed mobility: Needs Assistance Bed Mobility: Rolling;Sidelying to Sit;Sit to Sidelying Rolling: Mod assist Sidelying to sit: Max assist     Sit to sidelying: Max assist General bed mobility comments: Pt instructed in log rolling.  She rquired assist with all aspects.  She was able to assist minimally with trunk and with LEs   Transfers                 General transfer comment: unable to attempt     Balance Overall balance assessment: Needs assistance Sitting-balance support: Feet supported Sitting balance-Leahy Scale: Poor Sitting balance - Comments:  requires min A                                     ADL Overall ADL's : Needs assistance/impaired Eating/Feeding: Independent Eating/Feeding Details (indicate cue type and reason): although nsg reports spouse has been feeding pt  Grooming: Wash/dry hands;Wash/dry face;Brushing hair;Set up;Bed level   Upper Body Bathing: Maximal assistance;Bed level   Lower Body Bathing: Total assistance;Bed level   Upper Body Dressing : Total assistance;Bed level   Lower Body Dressing: Total assistance;Bed level   Toilet Transfer: Total assistance Toilet Transfer Details (indicate cue type and reason): unable to safely attempt  Toileting- Clothing Manipulation and Hygiene: Total assistance;Bed level       Functional mobility during ADLs: Maximal assistance General ADL Comments: Pt only able to tolerate EOB sitting.  Upon sitting, pt immediately began shaking - head/neck, Rt UE, and grunting with increased RR.  Pt instructed on pursed lip breahing which helped minimally.  Upon questioning, pt indicates she is cold and got chilled.  Blankets wrapped around pt with minimal improvement.  Spouse reports this behavior has been going on for >6wks weeks PTA, and occurs anytime she gets chilled.   Upon further questioning, pt reports that pain triggers the chills that then trigger the shaking.   Unable to adequately settle pt to progress session, so she was returned to supine afer EOB sitting x ~25 mins      Vision  Perception     Praxis      Pertinent Vitals/Pain Pain Assessment: 0-10 Pain Score: 4  Pain Location: back and neck  Pain Descriptors / Indicators: Aching;Spasm;Operative site guarding;Grimacing;Crying Pain Intervention(s): Monitored during session;Limited activity within patient's tolerance;Repositioned;Premedicated before session;Ice applied;Relaxation     Hand Dominance Right   Extremity/Trunk Assessment Upper Extremity Assessment Upper Extremity Assessment:  Overall WFL for tasks assessed   Lower Extremity Assessment Lower Extremity Assessment: Defer to PT evaluation       Communication Communication Communication: No difficulties   Cognition Arousal/Alertness: Awake/alert Behavior During Therapy: Anxious Overall Cognitive Status: Within Functional Limits for tasks assessed                     General Comments       Exercises       Shoulder Instructions      Home Living Family/patient expects to be discharged to:: Private residence Living Arrangements: Spouse/significant other;Children Available Help at Discharge: Family;Available 24 hours/day Type of Home: House Home Access: Level entry     Home Layout: Two level;Bed/bath upstairs Alternate Level Stairs-Number of Steps: 13   Bathroom Shower/Tub: Walk-in shower;Tub only   Bathroom Toilet: Handicapped height     Home Equipment: Financial controller: Reacher Additional Comments: Pt reports that one of her sons, or spouse will be with her 24/7      Prior Functioning/Environment Level of Independence: Independent        Comments: Pt reports she was independent including driving         OT Problem List: Decreased strength;Decreased activity tolerance;Impaired balance (sitting and/or standing);Decreased safety awareness;Decreased knowledge of use of DME or AE;Decreased knowledge of precautions;Obesity;Pain      OT Treatment/Interventions: Self-care/ADL training;DME and/or AE instruction;Therapeutic activities;Patient/family education;Balance training    OT Goals(Current goals can be found in the care plan section) Acute Rehab OT Goals Patient Stated Goal: to get better  OT Goal Formulation: With patient/family Time For Goal Achievement: 10/19/16 Potential to Achieve Goals: Good ADL Goals Pt Will Perform Grooming: with set-up;with supervision;sitting Pt Will Perform Upper Body Bathing: with supervision;with set-up;sitting Pt Will  Perform Lower Body Bathing: with min assist;with adaptive equipment;sit to/from stand Pt Will Perform Upper Body Dressing: with set-up;with supervision;sitting Pt Will Perform Lower Body Dressing: with min assist;with adaptive equipment;sit to/from stand Pt Will Transfer to Toilet: with min assist;ambulating;regular height toilet;bedside commode;grab bars Pt Will Perform Toileting - Clothing Manipulation and hygiene: with min assist;sit to/from stand;with adaptive equipment  OT Frequency: Min 2X/week   Barriers to D/C:            Co-evaluation              End of Session Equipment Utilized During Treatment:  (unable to apply brace due to severe shaking and unsteadiness) Nurse Communication: Mobility status;Other (comment) (pt behaviors during eval )  Activity Tolerance: Other (comment) (chills and shaking ) Patient left: in bed;with call bell/phone within reach;with family/visitor present  OT Visit Diagnosis: Pain                ADL either performed or assessed with clinical judgement  Time: [id number] OT Time Calculation (min): 56 min Charges:  OT General Charges $OT Visit: 1 Procedure OT Evaluation $OT Eval Moderate Complexity: 1 Procedure OT Treatments $Therapeutic Activity: 38-52 mins G-Codes:     [facility], OTR/L [id number]   Lucille Passy M 10/12/2016, 1:39 PM

## 2016-10-12 NOTE — Evaluation (Signed)
Physical Therapy Evaluation Patient Details Name: [name] MRN: [id number] DOB: September 06, 1950 Today's Date: 10/12/2016   History of Present Illness  This 66 y.o. female admitted for L1-S1 maximum access PLIF with PEEK cages, and posterolateral arthrodesis.  PMH includes: RA, pseudogout foot, HTN  Clinical Impression  Patient presents with problems listed below.  Will benefit from acute PT to maximize functional mobility prior to discharge.  Patient limited to bed mobility and sitting EOB today.  Unable to attempt standing.  Continued to have "chills" and shaking at EOB as per OT note this am.   Anticipate slow progress, requiring increased time for recovery/rehab.  Recommend ST-SNF at d/c prior to return home with husband.    Follow Up Recommendations SNF;Supervision/Assistance - 24 hour    Equipment Recommendations  Rolling walker with 5" wheels;3in1 (PT)    Recommendations for Other Services       Precautions / Restrictions Precautions Precautions: Back;Fall Precaution Booklet Issued: No Precaution Comments: Pt instructed in back precautions  Required Braces or Orthoses: Spinal Brace Spinal Brace: Applied in sitting position (To bathroom without brace) Restrictions Weight Bearing Restrictions: No      Mobility  Bed Mobility Overal bed mobility: Needs Assistance Bed Mobility: Rolling;Sidelying to Sit;Sit to Sidelying Rolling: Mod assist Sidelying to sit: Mod assist     Sit to sidelying: Mod assist General bed mobility comments: Patient able to recall log rolling from OT session.  Mod assist to roll to Lt side along with use of rail.  Mod assist to bring trunk to sitting position.  Required increased time for mobility.  Patient able to sit EOB x 8 minutes with UE support and min guard assist.  Patient shaking as described earlier in OT note.  Focused on deep breathing activities.  Patient unable to move to standing.  Returned to sidelying with assist to bring LE's onto  bed.  Transfers                 General transfer comment: NT  Ambulation/Gait                Stairs            Wheelchair Mobility    Modified Rankin (Stroke Patients Only)       Balance   Sitting-balance support: Feet supported;Single extremity supported Sitting balance-Leahy Scale: Poor                                       Pertinent Vitals/Pain Pain Assessment: 0-10 Pain Score: 3  Pain Location: back and neck  Pain Descriptors / Indicators: Aching;Grimacing Pain Intervention(s): Limited activity within patient's tolerance;Monitored during session;Repositioned;Patient requesting pain meds-RN notified    Home Living Family/patient expects to be discharged to:: Private residence Living Arrangements: Spouse/significant other;Children Available Help at Discharge: Family;Available 24 hours/day Type of Home: House Home Access: Level entry     Home Layout: Two level;Bed/bath upstairs Home Equipment: None Additional Comments: Pt reports that one of her sons, or spouse will be with her 24/7    Prior Function Level of Independence: Independent         Comments: Pt reports she was independent including driving      Hand Dominance   Dominant Hand: Right    Extremity/Trunk Assessment   Upper Extremity Assessment Upper Extremity Assessment: Defer to OT evaluation    Lower Extremity Assessment Lower Extremity Assessment: Generalized weakness  Communication   Communication: No difficulties  Cognition Arousal/Alertness: Awake/alert Behavior During Therapy: Anxious;Flat affect Overall Cognitive Status: Within Functional Limits for tasks assessed                      General Comments      Exercises General Exercises - Lower Extremity Ankle Circles/Pumps: AROM;Both;10 reps;Seated   Assessment/Plan    PT Assessment Patient needs continued PT services  PT Problem List Decreased strength;Decreased activity  tolerance;Decreased balance;Decreased mobility;Decreased knowledge of use of DME;Decreased knowledge of precautions;Cardiopulmonary status limiting activity;Obesity;Pain       PT Treatment Interventions DME instruction;Gait training;Stair training;Functional mobility training;Therapeutic activities;Patient/family education    PT Goals (Current goals can be found in the Care Plan section)  Acute Rehab PT Goals Patient Stated Goal: to get better  PT Goal Formulation: With patient/family Time For Goal Achievement: 10/26/16 Potential to Achieve Goals: Good    Frequency Min 5X/week   Barriers to discharge Inaccessible home environment Bedroom/bathroom are upstairs - 13 steps    Co-evaluation               End of Session Equipment Utilized During Treatment: Oxygen Activity Tolerance: Patient limited by pain;Patient limited by fatigue Patient left: in bed;with call bell/phone within reach;with bed alarm set;with family/visitor present;with SCD's reapplied Nurse Communication: Mobility status;Patient requests pain meds (Nausea meds) PT Visit Diagnosis: Other abnormalities of gait and mobility (R26.89);Muscle weakness (generalized) (M62.81);Pain Pain - part of body:  (Back)         Time: [id number] PT Time Calculation (min) (ACUTE ONLY): 16 min   Charges:   PT Evaluation $PT Eval Moderate Complexity: 1 Procedure     PT G Codes:         Despina Pole 10-21-16, 10:27 PM Carita Pian. Sanjuana Kava, [place] Pager [phone]

## 2016-10-13 MED ORDER — SODIUM CHLORIDE 0.9% FLUSH
3.0000 mL | Freq: Two times a day (BID) | INTRAVENOUS | Status: DC
  Administered 2016-10-13 – 2016-10-14 (×2): 3 mL via INTRAVENOUS

## 2016-10-13 MED ORDER — HYDROCODONE-ACETAMINOPHEN 5-325 MG PO TABS
1.0000 | ORAL_TABLET | ORAL | Status: DC | PRN
  Filled 2016-10-13: qty 1

## 2016-10-13 NOTE — Progress Notes (Signed)
LCSW was consulted for skilled nursing placement. Met with patient at bedside along with her husband reports they plan to go home and would like home health vs SNF.  LCSW will speak with CM regarding plan.  No other needs or questions warranted at this time.  Plan: CSW signed off and patient planning to go home.

## 2016-10-13 NOTE — Progress Notes (Addendum)
Pt seen and examined.  No issues overnight. Reports last night was a much better night pain wise. Tolerating po. Denies bowel or bladder dysfunction Does not feel as though she is ready to go home  EXAM: Temp:  [98.9 F (37.2 C)-99.9 F (37.7 C)] 99 F (37.2 C) (02/25 0500) Pulse Rate:  [75-83] 75 (02/25 0500) Resp:  [15-18] 16 (02/25 0500) BP: (104-132)/(51-62) 132/57 (02/25 0500) SpO2:  [94 %-97 %] 94 % (02/25 0500) Weight:  [103.9 kg (229 lb)] 103.9 kg (229 lb) (02/24 1600) Intake/Output      02/24 0701 - 02/25 0700   Urine (mL/kg/hr) 2425 (1)   Total Output 2425   Net -2425        Awake and alert Follows commands throughout Full strength Surgical site c/d/i. No signs of infection  Plan Appears to be improving slowly Continue current care Most likely d/c tomorrow   Addendum Nursing reports patient advised them Oxycodone gives her a HA She only has oxycodone listed for breakthrough pain Pain is currently managed with tylenol and muscle relaxer Put in order for vicodin as needed/ D/C Oxy

## 2016-10-13 NOTE — Progress Notes (Signed)
Physical Therapy Treatment Patient Details Name: [name] MRN: [id number] DOB: 1951/03/11 Today's Date: 10/13/2016    History of Present Illness This 66 y.o. female admitted for L1-S1 maximum access PLIF with PEEK cages, and posterolateral arthrodesis.  PMH includes: RA, pseudogout foot, HTN    PT Comments    Able to progress mobility despite fatigue and pain; Educated in back precautions, and extensive education re: benefits of getting up, moving, walking to combat the effects of bedrest; Hypotensive after walking (see serial BPs below) -- RN aware    Follow Up Recommendations  SNF;Supervision/Assistance - 24 hour     Equipment Recommendations  Rolling walker with 5" wheels;3in1 (PT)    Recommendations for Other Services       Precautions / Restrictions Precautions Precautions: Back;Fall Precaution Comments: Pt instructed in back precautions  Required Braces or Orthoses: Spinal Brace Spinal Brace: Applied in sitting position    Mobility  Bed Mobility Overal bed mobility: Needs Assistance Bed Mobility: Rolling;Sidelying to Sit Rolling: Min guard Sidelying to sit: Min guard       General bed mobility comments: able to recall log rolling, hob flat, utilized rails to assist with rolling, exited on L side.  max cues and increased time to scoot eob and weight shift hips  Transfers Overall transfer level: Needs assistance Equipment used: Rolling walker (2 wheeled) Transfers: Sit to/from [facility] Sit to Stand: Min assist;Mod assist Stand pivot transfers: Min assist       General transfer comment: max inst. cues for safe hand placement, intermittent tactile guidance  Ambulation/Gait Ambulation/Gait assistance: Min guard;+2 safety/equipment Ambulation Distance (Feet): 30 Feet Assistive device: Rolling walker (2 wheeled) Gait Pattern/deviations: Shuffle;Decreased step length - right;Decreased step length - left     General Gait Details:  Cues for upright posture and eyes open during walk; second person for safety   Stairs            Wheelchair Mobility    Modified Rankin (Stroke Patients Only)       Balance   Sitting-balance support: Bilateral upper extremity supported Sitting balance-Leahy Scale: Fair       Standing balance-Leahy Scale: Poor                      Cognition Arousal/Alertness: Awake/alert Behavior During Therapy: Flat affect Overall Cognitive Status: Within Functional Limits for tasks assessed                 General Comments: Needing encouragement to participate    Exercises General Exercises - Lower Extremity Ankle Circles/Pumps: AROM;Both;Seated;20 reps (at least 20 reps)    General Comments General comments (skin integrity, edema, etc.):    10/13/16 0815 10/13/16 0819 10/13/16 0823  Vital Signs  Pulse Rate 65 64 66  BP (!) 89/51 (!) 92/56 (!) 101/57  Patient Position (if appropriate) Sitting Sitting Sitting         Pertinent Vitals/Pain Pain Assessment: 0-10 Pain Score: 5  Pain Location: back Pain Descriptors / Indicators: Aching Pain Intervention(s): Limited activity within patient's tolerance;Monitored during session;Premedicated before session;Repositioned    Home Living                      Prior Function            PT Goals (current goals can now be found in the care plan section) Acute Rehab PT Goals Patient Stated Goal: to get better  PT Goal Formulation: With patient/family Time For  Goal Achievement: 10/26/16 Potential to Achieve Goals: Good Progress towards PT goals: Progressing toward goals    Frequency    Min 5X/week      PT Plan Current plan remains appropriate    Co-evaluation PT/OT/SLP Co-Evaluation/Treatment: Yes Reason for Co-Treatment: For patient/therapist safety PT goals addressed during session: Mobility/safety with mobility       End of Session Equipment Utilized During Treatment: Gait belt;Back  brace Activity Tolerance: Patient limited by fatigue Patient left: in chair;with call bell/phone within reach;with family/visitor present;with nursing/sitter in room Nurse Communication: Mobility status;Other (comment) (BP) PT Visit Diagnosis: Other abnormalities of gait and mobility (R26.89);Muscle weakness (generalized) (M62.81);Pain Pain - part of body:  (Back)     Time: [id number] PT Time Calculation (min) (ACUTE ONLY): 38 min  Charges:  $Gait Training: 8-22 mins                    G Codes:       Colletta Maryland 15-Oct-2016, 8:39 AM  Roney Marion, [place] Pager [phone] Office [phone]

## 2016-10-13 NOTE — Progress Notes (Signed)
BP daily meds held today due to Low Bp. Will continue to monitor. VS 101/57, with pulse 60.

## 2016-10-13 NOTE — Progress Notes (Signed)
Occupational Therapy Treatment Patient Details Name: [name] MRN: [id number] DOB: 07/26/1951 Today's Date: 10/13/2016    History of present illness This 66 y.o. female admitted for L1-S1 maximum access PLIF with PEEK cages, and posterolateral arthrodesis.  PMH includes: RA, pseudogout foot, HTN   OT comments  Pt. Seen for co-tx with PT to promote safety while progressing pts. Functional mobility in conjunction with ADL completion.  Pt. Continues to c/o nausea and dizziness.  Fearful of vomiting.  Was able to tolerate OOB and short distance ambulation (see PT notes for details), along with standing grooming tasks.  At end of session pt. With increased c/o not feeling well.  Encouraged oob as able to tolerate.  Will continue to follow acutely.    Initial BP 89/51 P 63 2nd BP 92/52 P 65 3rd BP 101/57 P 70  O2 also placed by RN as initial o2 was 87 and quickly returned to 92   Follow Up Recommendations  SNF;Supervision/Assistance - 24 hour    Equipment Recommendations       Recommendations for Other Services      Precautions / Restrictions Precautions Precautions: Back;Fall Precaution Comments: Pt instructed in back precautions  Required Braces or Orthoses: Spinal Brace Spinal Brace: Applied in sitting position       Mobility Bed Mobility Overal bed mobility: Needs Assistance Bed Mobility: Rolling;Sidelying to Sit Rolling: Min guard Sidelying to sit: Min guard       General bed mobility comments: able to recall log rolling, hob flat, utilized rails to assist with rolling, exited on L side.  max cues and increased time to scoot eob and weight shift hips  Transfers Overall transfer level: Needs assistance Equipment used: Rolling walker (2 wheeled) Transfers: Sit to/from [facility] Sit to Stand: Min assist;Mod assist Stand pivot transfers: Min assist       General transfer comment: max inst. cues for safe hand placement, intermittent tactile  guidance    Balance   Sitting-balance support: Bilateral upper extremity supported Sitting balance-Leahy Scale: Fair       Standing balance-Leahy Scale: Poor                     ADL Overall ADL's : Needs assistance/impaired     Grooming: Wash/dry hands;Wash/dry face;Oral care;Min guard;Standing Grooming Details (indicate cue type and reason): educated on walker placement while standing at the counter         Upper Body Dressing : Maximal assistance;Sitting Upper Body Dressing Details (indicate cue type and reason): to don brace and gown     Toilet Transfer: Minimal assistance;RW Toilet Transfer Details (indicate cue type and reason): simulated during amb. in the room to the recliner, cues for backing all the way up before sitting down and reaching for the arm rests Toileting- Clothing Manipulation and Hygiene: Maximal assistance;Sit to/from stand Toileting - Clothing Manipulation Details (indicate cue type and reason): simulated during in room transfers, (declined toilet transfer when offered)     Functional mobility during ADLs: Minimal assistance        Vision                     Perception     Praxis      Cognition   Behavior During Therapy: Flat affect Overall Cognitive Status: Within Functional Limits for tasks assessed                  General Comments: Needing encouragement to participate  Exercises General Exercises - Lower Extremity Ankle Circles/Pumps: AROM;Both;Seated;20 reps (at least 20 reps)   Shoulder Instructions       General Comments      Pertinent Vitals/ Pain       Pain Assessment: 0-10 Pain Score: 5  Pain Location: back Pain Descriptors / Indicators: Aching Pain Intervention(s): Limited activity within patient's tolerance;Monitored during session;Premedicated before session;Repositioned  Home Living                                          Prior Functioning/Environment               Frequency  Min 2X/week        Progress Toward Goals  OT Goals(current goals can now be found in the care plan section)  Progress towards OT goals: Progressing toward goals  Acute Rehab OT Goals Patient Stated Goal: to get better   Plan Discharge plan remains appropriate    Co-evaluation    PT/OT/SLP Co-Evaluation/Treatment: Yes Reason for Co-Treatment: For patient/therapist safety;To address functional/ADL transfers PT goals addressed during session: Mobility/safety with mobility OT goals addressed during session: ADL's and self-care      End of Session Equipment Utilized During Treatment: Rolling walker;Gait belt;Back brace;Oxygen;Other (comment) (o2 placed by RN at end of session)  OT Visit Diagnosis: Pain   Activity Tolerance Other (comment) (limited by c/o nausea, dizziness )   Patient Left in chair;with call bell/phone within reach;with family/visitor present;with nursing/sitter in room   Nurse Communication Other (comment) (Vital signs taken 3x and reported to RN pt. not feeling well)        Time: [id number] OT Time Calculation (min): 38 min  Charges: OT General Charges $OT Visit: 1 Procedure OT Treatments $Self Care/Home Management : 23-37 mins     Janice Coffin, COTA/L 10/13/2016, 8:43 AM

## 2016-10-14 ENCOUNTER — Encounter (HOSPITAL_COMMUNITY): Payer: Self-pay | Admitting: Neurosurgery

## 2016-10-14 MED ORDER — TIZANIDINE HCL 4 MG PO TABS: 2.0000 mg | ORAL_TABLET | Freq: Four times a day (QID) | ORAL | 1 refills | Status: DC | PRN

## 2016-10-14 MED FILL — tiZANidine HCL 4 MG TABS: 4 | 15 days supply | Qty: 60 | Fill #0

## 2016-10-14 NOTE — Discharge Summary (Signed)
Physician Discharge Summary  Patient ID: [name] MRN: [id number] DOB/AGE: [birth year] 66 y.o.  Admit date: 10/11/2016 Discharge date: 10/14/2016  Admission Diagnoses:Lumbar stenosis, spondylolisthesis, radiculopath HNP, L 5 S 1   Discharge Diagnoses: Lumbar stenosis, spondylolisthesis, radiculopath HNP, L 5 S 1   Active Problems:   Lumbar stenosis with neurogenic claudication   Discharged Condition: good  Hospital Course: Patient underwent decompression and fusion L 5 S 1 with significant improvement in back and leg pain and weakness  Consults: None  Significant Diagnostic Studies: None  Treatments: surgery: decompression and fusion L 5 S 1  Discharge Exam: Blood pressure (!) 140/59, pulse 72, temperature 98.6 F (37 C), temperature source Oral, resp. rate 18, height 5\' 6"  (1.676 m), weight 103.9 kg (229 lb), SpO2 100 %. Neurologic: Alert and oriented X 3, normal strength and tone. Normal symmetric reflexes. Normal coordination and gait Wound:CDI  Disposition: Home   Allergies as of 10/14/2016   No Known Allergies     Medication List    STOP taking these medications   hydroxychloroquine 200 MG tablet Commonly known as:  PLAQUENIL   metoprolol succinate 50 MG 24 hr tablet Commonly known as:  TOPROL-XL     TAKE these medications   amLODipine 5 MG tablet Commonly known as:  NORVASC Take 0.5 tablets (2.5 mg total) by mouth daily.   aspirin 81 MG tablet Take 81 mg by mouth daily.   B-complex with vitamin C tablet Take 2 tablets by mouth daily.   BIOTIN PO Take 1 tablet by mouth daily.   gabapentin 100 MG capsule Commonly known as:  NEURONTIN Take 2 capsules (200 mg total) by mouth 2 (two) times daily.   guaiFENesin 600 MG 12 hr tablet Commonly known as:  MUCINEX Take 600 mg by mouth 2 (two) times daily as needed for cough or to loosen phlegm.   losartan-hydrochlorothiazide 100-25 MG tablet Commonly known as:  HYZAAR Take 1 tablet by mouth  daily.   naproxen sodium 220 MG tablet Commonly known as:  ALEVE Take 1 tablet (220 mg total) by mouth 2 (two) times daily with a meal. What changed:  when to take this  reasons to take this   pyridOXINE 100 MG tablet Commonly known as:  VITAMIN B-6 Take 100 mg by mouth daily.   ranitidine 150 MG tablet Commonly known as:  ZANTAC Take 150 mg by mouth daily as needed for heartburn.   RED YEAST RICE PO Take 2 tablets by mouth daily.   sulfaSALAzine 500 MG EC tablet Commonly known as:  AZULFIDINE TAKE 2 TABLETS BY MOUTH TWICE DAILY   thiamine 100 MG tablet Commonly known as:  VITAMIN B-1 Take 100 mg by mouth daily.   tiZANidine 4 MG tablet Commonly known as:  ZANAFLEX Take 0.5-1 tablets (2-4 mg total) by mouth every 6 (six) hours as needed for muscle spasms.   TURMERIC CURCUMIN PO Take 2 tablets by mouth daily.   VITAMIN B-12 PO Take 1 tablet by mouth daily.        Signed: Peggyann Shoals, MD 10/14/2016, 7:41 AM

## 2016-10-14 NOTE — Care Management Note (Signed)
Case Management Note  Patient Details  Name: SHANELE BELONGIA MRN: [id number] Date of Birth: [birth year]  Subjective/Objective:                    Action/Plan: Pt discharging home with her husband and orders for [facility] services. CM provided her with a list of [place] agencies. She selected [facility]. [facility] Glad informed and accepted the referral. Pt also with orders for walker. Santiago Glad will have equipment delivered to the room. Husband to provide transportation home.   Expected Discharge Date:  10/14/16               Expected Discharge Plan:  [place]  In-House Referral:  Clinical Social Work  Discharge planning Services  CM Consult  Post Acute Care Choice:  Durable Medical Equipment, Home Health Choice offered to:  Patient  DME Arranged:  Walker rolling DME Agency:  [place]:  PT [facility] Agency:  [place]  Status of Service:  Completed, signed off  If discussed at [facility] of Stay Meetings, dates discussed:    Additional Comments:  Pollie Friar, RN 10/14/2016, 10:11 AM

## 2016-10-14 NOTE — Progress Notes (Addendum)
Occupational Therapy Treatment Patient Details Name: [name] MRN: [id number] DOB: October 12, 1950 Today's Date: 10/14/2016    History of present illness This 66 y.o. female admitted for L1-S1 maximum access PLIF with PEEK cages, and posterolateral arthrodesis.  PMH includes: RA, pseudogout foot, HTN   OT comments  Pt demonstrating good progress toward OT goals. No report of dizziness/lightheadedness this session. She was able to complete toilet transfers this session with supervision for safety and shower transfers with min guard assist. She was able to recall 3/3 back precautions but continues to require VC's to adhere to these. She continues to be limited by significant pain. Pt reporting preference to go home vs. SNF at this time and discussed need for assistance at all times. She reports that she will have assistance from her family at all times. Husband present and demonstrates good understanding of all topics. Updated D/C recommendation to [facility] services in order to improve independence with ADL and functional mobility. Will continue to follow acutely.    Follow Up Recommendations  Home health OT;Supervision/Assistance - 24 hour    Equipment Recommendations  3 in 1 bedside commode;Tub/shower bench    Recommendations for Other Services      Precautions / Restrictions Precautions Precautions: Back;Fall Precaution Booklet Issued: No Precaution Comments: Pt able to recall 3/3 back precautions independently.  Required Braces or Orthoses: Spinal Brace Spinal Brace: Lumbar corset;Applied in sitting position Restrictions Weight Bearing Restrictions: No       Mobility Bed Mobility Overal bed mobility: Needs Assistance Bed Mobility: Rolling;Sidelying to Sit;Sit to Sidelying Rolling: Supervision Sidelying to sit: Supervision     Sit to sidelying: Supervision General bed mobility comments: Pt able to recall log roll technique and required supervision for safety throughout bed  mobility.   Transfers Overall transfer level: Needs assistance Equipment used: Rolling walker (2 wheeled) Transfers: Sit to/from Stand Sit to Stand: Supervision         General transfer comment: VC's for safe hand placement on initial standing.    Balance Overall balance assessment: Needs assistance Sitting-balance support: Feet supported Sitting balance-Leahy Scale: Fair Sitting balance - Comments: Pt able to apply brace in sitting with set up.    Standing balance support: Bilateral upper extremity supported Standing balance-Leahy Scale: Fair Standing balance comment: Able to stand at sink without UE support and with supervision from OT for safety to brush teeth.                   ADL Overall ADL's : Needs assistance/impaired     Grooming: Oral care;Supervision/safety;Standing           Upper Body Dressing : Minimal assistance;Sitting Upper Body Dressing Details (indicate cue type and reason): Min assist to don brace. VC's for not twisting. Lower Body Dressing: Moderate assistance;Sit to/from stand   Toilet Transfer: Supervision/safety;RW     Toileting - Clothing Manipulation Details (indicate cue type and reason): Discussed no twisting. Pt had already used restroom. Tub/ Shower Transfer: Min guard;Ambulation;Rolling walker   Functional mobility during ADLs: Supervision/safety;Rolling walker General ADL Comments: Pt much improved this session. Educated on compensatory ADL strategies including 2 cup method for brushing teeth and use of wet wipes for toileting.      Vision                     Perception     Praxis      Cognition   Behavior During Therapy: [facility] for tasks assessed/performed Overall Cognitive Status: Within  Functional Limits for tasks assessed                  General Comments: Needing encouragement to participate      Exercises     Shoulder Instructions       General Comments      Pertinent Vitals/ Pain        Pain Assessment: 0-10 Pain Score: 7  Pain Location: back  Pain Descriptors / Indicators: Operative site guarding;Spasm Pain Intervention(s): Monitored during session;Repositioned  Home Living                                          Prior Functioning/Environment              Frequency           Progress Toward Goals  OT Goals(current goals can now be found in the care plan section)  Progress towards OT goals: Progressing toward goals  Acute Rehab OT Goals Patient Stated Goal: to get better  OT Goal Formulation: With patient/family Time For Goal Achievement: 10/19/16 Potential to Achieve Goals: Good ADL Goals Pt Will Perform Grooming: with set-up;with supervision;sitting Pt Will Perform Upper Body Bathing: with supervision;with set-up;sitting Pt Will Perform Lower Body Bathing: with min assist;with adaptive equipment;sit to/from stand Pt Will Perform Upper Body Dressing: with set-up;with supervision;sitting Pt Will Perform Lower Body Dressing: with min assist;with adaptive equipment;sit to/from stand Pt Will Transfer to Toilet: with min assist;ambulating;regular height toilet;bedside commode;grab bars Pt Will Perform Toileting - Clothing Manipulation and hygiene: with min assist;sit to/from stand;with adaptive equipment  Plan Discharge plan needs to be updated    Co-evaluation                 End of Session Equipment Utilized During Treatment: Gait belt;Back brace;Rolling walker  OT Visit Diagnosis: Pain;Unsteadiness on feet (R26.81)   Activity Tolerance Patient tolerated treatment well   Patient Left in chair;with call bell/phone within reach;with family/visitor present   Nurse Communication          Time: [id number] OT Time Calculation (min): 21 min  Charges: OT General Charges $OT Visit: 1 Procedure OT Treatments $Self Care/Home Management : 8-22 mins  Norman Herrlich, MS OTR/L  Pager: [place]  [name] 10/14/2016, 11:39 AM

## 2016-10-14 NOTE — Progress Notes (Signed)
Physical Therapy Treatment Patient Details Name: [name] MRN: [id number] DOB: April 29, 1951 Today's Date: 10/14/2016    History of Present Illness This 66 y.o. female admitted for L1-S1 maximum access PLIF with PEEK cages, and posterolateral arthrodesis.  PMH includes: RA, pseudogout foot, HTN    PT Comments    Pt progressing towards goals. Per CSW notes and pt report, pt wants to go home with HHPT vs. SNF. D/c recommendations updated. Pt increased ambulation tolerance, however, continues to be limited secondary to reports of back spasms. Attempted stair training, however, pt unable to complete full flight of stairs. Pt completed some stair training and required min guard to min A, however, continues to be limited by back pain. Educated about HHPT and assist required at home. Pt states husband and son will be able to provide 24/7 assist that is required. Recommending HHPT for follow up recommendations. Will continue to follow.    Follow Up Recommendations  Home health PT;Supervision/Assistance - 24 hour     Equipment Recommendations  Rolling walker with 5" wheels;3in1 (PT)    Recommendations for Other Services       Precautions / Restrictions Precautions Precautions: Back;Fall Precaution Booklet Issued: No Precaution Comments: Pt able to recall 1/3 back precautions independently. Required verbal cues for lifting and twisting precautions.  Required Braces or Orthoses: Spinal Brace Spinal Brace: Lumbar corset;Applied in sitting position Restrictions Weight Bearing Restrictions: No    Mobility  Bed Mobility Overal bed mobility: Needs Assistance Bed Mobility: Rolling;Sidelying to Sit Rolling: Supervision Sidelying to sit: Supervision       General bed mobility comments: Pt able to recall log roll technique and required supervision for safety throughout bed mobility.   Transfers Overall transfer level: Needs assistance Equipment used: Rolling walker (2  wheeled) Transfers: Sit to/from Stand Sit to Stand: Supervision         General transfer comment: Required verbal cues for appropriate UE placement during transfer. Pt required verbal cues for scooting to EOB before standing this session.   Ambulation/Gait Ambulation/Gait assistance: Supervision;Min guard;+2 safety/equipment (chair follow ) Ambulation Distance (Feet): 50 Feet Assistive device: Rolling walker (2 wheeled) Gait Pattern/deviations: Step-through pattern;Decreased stride length;Wide base of support;Trunk flexed Gait velocity: Decreased Gait velocity interpretation: Below normal speed for age/gender General Gait Details: Required chair follow for safety during ambulation. Pt required verbal cues for upright posture. Reports of spasming in back which limited gait distance. Required min A towards end of gait training seondary to decreased steadiness from back spasms.    Stairs Stairs: Yes   Stair Management: One rail Right;Step to pattern;Sideways Number of Stairs: 7 General stair comments: Pt educated about using BUE for stair mangement and ascending/descending stairs sideways to maintain precautions. Pt limited in number of stairs performed secondary to back spasms. required min A for descending secondary to back spasms and decreased steadiness. Pt has bathroom on first level and educated about staying on first level until spasms decrease and safety with mobility increased. Pt and husband educated about assist required on stairs.   Wheelchair Mobility    Modified Rankin (Stroke Patients Only)       Balance Overall balance assessment: Needs assistance Sitting-balance support: Feet supported Sitting balance-Leahy Scale: Fair Sitting balance - Comments: Pt able to apply brace in sitting with set up.    Standing balance support: Bilateral upper extremity supported Standing balance-Leahy Scale: Poor Standing balance comment: Required use of BUE on RW to maintain static  standing balance.  Cognition Arousal/Alertness: Awake/alert Behavior During Therapy: WFL for tasks assessed/performed Overall Cognitive Status: Within Functional Limits for tasks assessed                      Exercises      General Comments General comments (skin integrity, edema, etc.): Husband present during session. Educated about use of shower seat and hand held shower head during bathing activities for increased safety. Educated about assist level required at home. Pt reporting she wants to go home with HHPT.        Pertinent Vitals/Pain Pain Assessment: 0-10 Pain Score: 8  (during ambulation ) Pain Location: back  Pain Descriptors / Indicators: Operative site guarding;Spasm Pain Intervention(s): Limited activity within patient's tolerance;Monitored during session;Repositioned    Home Living                      Prior Function            PT Goals (current goals can now be found in the care plan section) Acute Rehab PT Goals Patient Stated Goal: to get better  PT Goal Formulation: With patient/family Time For Goal Achievement: 10/26/16 Potential to Achieve Goals: Good Progress towards PT goals: Progressing toward goals    Frequency    Min 5X/week      PT Plan Discharge plan needs to be updated    Co-evaluation             End of Session Equipment Utilized During Treatment: Gait belt;Back brace Activity Tolerance: Patient limited by pain Patient left: in bed;with chair alarm set;with family/visitor present;with call bell/phone within reach Nurse Communication: Mobility status PT Visit Diagnosis: Muscle weakness (generalized) (M62.81);Pain;Other abnormalities of gait and mobility (R26.89) Pain - part of body:  (back)     Time: [id number] PT Time Calculation (min) (ACUTE ONLY): 35 min  Charges:  $Gait Training: 23-37 mins                    G Codes:       Mamie Levers 10/14/2016, 9:29  AM  Nicky Pugh, PT, DPT  Acute Rehabilitation Services  Pager: [phone]

## 2016-10-14 NOTE — Progress Notes (Signed)
Subjective: Patient reports doing much better  Objective: Vital signs in last 24 hours: Temp:  [98.6 F (37 C)-98.8 F (37.1 C)] 98.6 F (37 C) (02/26 0500) Pulse Rate:  [63-74] 72 (02/26 0500) Resp:  [16-18] 18 (02/26 0500) BP: (89-144)/(51-71) 140/59 (02/26 0500) SpO2:  [99 %-100 %] 100 % (02/26 0500)  Intake/Output from previous day: 02/25 0701 - 02/26 0700 In: 1500 [P.O.:600; I.V.:900] Out: -  Intake/Output this shift: No intake/output data recorded.  Physical Exam: Feet no longer red/hot.  Strength full.  Dressing CDI  Lab Results: No results for input(s): WBC, HGB, HCT, PLT in the last 72 hours. BMET No results for input(s): NA, K, CL, CO2, GLUCOSE, BUN, CREATININE, CALCIUM in the last 72 hours.  Studies/Results: No results found.  Assessment/Plan: Doing well.  D/C home.    LOS: 3 days    Peggyann Shoals, MD 10/14/2016, 7:39 AM

## 2016-10-14 NOTE — Progress Notes (Signed)
Subjective: Patient reports "I feel great! I'm just taking Tylenol when I have some pain"  Objective: Vital signs in last 24 hours: Temp:  [98.6 F (37 C)-98.8 F (37.1 C)] 98.6 F (37 C) (02/26 0500) Pulse Rate:  [63-74] 72 (02/26 0500) Resp:  [16-18] 18 (02/26 0500) BP: (89-144)/(51-71) 140/59 (02/26 0500) SpO2:  [99 %-100 %] 100 % (02/26 0500)  Intake/Output from previous day: 02/25 0701 - 02/26 0700 In: 1500 [P.O.:600; I.V.:900] Out: -  Intake/Output this shift: No intake/output data recorded.  Alert, conversant. Husband present. No report of headache or leg pain. Pt notes occasional lumbar/incisional pain, controlled by Tylenol. She has stopped Robaxin as it lowered her blood pressure. Incision is flat without erythema or drainage beneath Dermabond and Honeycomb Drsg. Strength is good BLE.   Lab Results: No results for input(s): WBC, HGB, HCT, PLT in the last 72 hours. BMET No results for input(s): NA, K, CL, CO2, GLUCOSE, BUN, CREATININE, CALCIUM in the last 72 hours.  Studies/Results: No results found.  Assessment/Plan: Improving  LOS: 3 days  Ok per DrStern to d/c to home. RW, HHPT. Pt has sufficient pain medication at home for prn use. Tizanidine 2mg  will be eRx'ed through office for prn use as alternative to Robaxin. Pt verbalizes understanding of d/c instructions and agrees to call office to schedue 3-4 week f/u appt.    Verdis Prime 10/14/2016, 7:35 AM

## 2016-10-15 DIAGNOSIS — Z4789 Encounter for other orthopedic aftercare: Secondary | ICD-10-CM | POA: Diagnosis not present

## 2016-10-15 DIAGNOSIS — M4316 Spondylolisthesis, lumbar region: Secondary | ICD-10-CM | POA: Diagnosis not present

## 2016-10-15 DIAGNOSIS — M069 Rheumatoid arthritis, unspecified: Secondary | ICD-10-CM | POA: Diagnosis not present

## 2016-10-16 ENCOUNTER — Other Ambulatory Visit: Payer: Self-pay | Admitting: *Deleted

## 2016-10-16 ENCOUNTER — Telehealth: Payer: Self-pay | Admitting: Family Medicine

## 2016-10-16 ENCOUNTER — Encounter: Payer: Self-pay | Admitting: *Deleted

## 2016-10-16 DIAGNOSIS — M4807 Spinal stenosis, lumbosacral region: Secondary | ICD-10-CM | POA: Diagnosis not present

## 2016-10-16 DIAGNOSIS — R269 Unspecified abnormalities of gait and mobility: Secondary | ICD-10-CM | POA: Diagnosis not present

## 2016-10-16 NOTE — Telephone Encounter (Signed)
Pt just had back surgery and was called by [facility] nurse today. Nurse advised pt she is not supposed to be taking  hydroxychloroquine (PLAQUENIL) 200 MG tablet metoprolol succinate (TOPROL-XL) 50 MG 24 hr tablet  Pt states she has continued to take these 2 meds since home from the hospital. Would like Dr Maudie Mercury to call her and let her know what she is supposed to be taking.  Pt aware Dr Maudie Mercury is out today and will look for a call back tomorrow, Thursday, March 1

## 2016-10-16 NOTE — Patient Outreach (Addendum)
[name]) Care Management  10/16/2016  [name] 02-24-1951 [id number]   Subjective: Telephone call to patient's home number times 2, spoke with patient, and HIPAA verified.  Patient also gave RNCM verbal authorization to speak with husband [name] regarding her healthcare as needed.   Discussed [facility] Care Management [facility] Transition of care follow up, patient voices understanding, and is in agreement to complete follow up. Patient states she is doing well, receiving home health physical therapy, has supportive family that is assisting her, and a scheduled follow up appointment with surgeon on 11/04/16.   States her primary MD increased blood pressure medication prior to admission due to elevation. During hospitalization blood pressure was low, has purchased a blood pressure,  planning to monitor blood pressure, and will report readings to MD.  States today she has not taken blood pressure because husband just got home with the cuff an hour ago.  Medication review completed and discrepancies identified.   Patient states she will call primary MD today to discuss discrepancies (Norvasc, Toprol, and Plaquenil) and schedule sooner follow up appointment .   Spoke with patient's husband regarding hospital indemnity plan, voiced understanding, states he did not get this benefit, and will double check with his Scientist, research (medical) department.  Patient states she does not have any transition of care, care coordination, disease management, disease monitoring, transportation, community resource, or pharmacy needs at this time.  States she is very appreciative of follow up and is in agreement with receiving [facility] information.   Outpatient Encounter Prescriptions as of 10/16/2016  Medication Sig Note  . gabapentin (NEURONTIN) 100 MG capsule Take 2 capsules (200 mg total) by mouth 2 (two) times daily.   Marland Kitchen guaiFENesin (MUCINEX) 600 MG 12 hr tablet Take 600 mg by mouth 2 (two) times daily  as needed for cough or to loosen phlegm.    . hydroxychloroquine (PLAQUENIL) 200 MG tablet Take 200 mg by mouth daily.   Marland Kitchen losartan-hydrochlorothiazide (HYZAAR) 100-25 MG tablet Take 1 tablet by mouth daily.   . ranitidine (ZANTAC) 150 MG tablet Take 150 mg by mouth daily as needed for heartburn.    . sulfaSALAzine (AZULFIDINE) 500 MG EC tablet TAKE 2 TABLETS BY MOUTH TWICE DAILY   . tiZANidine (ZANAFLEX) 4 MG tablet Take 0.5-1 tablets (2-4 mg total) by mouth every 6 (six) hours as needed for muscle spasms.   Marland Kitchen amLODipine (NORVASC) 5 MG tablet Take 0.5 tablets (2.5 mg total) by mouth daily. (Patient not taking: Reported on 10/16/2016) 10/16/2016: Patient states she is not taking, will call the MD to clarify.   Marland Kitchen aspirin 81 MG tablet Take 81 mg by mouth daily. 10/07/2016: Currently holding.  . B Complex-C (B-COMPLEX WITH VITAMIN C) tablet Take 2 tablets by mouth daily. 10/07/2016: Currently holding.  Marland Kitchen BIOTIN PO Take 1 tablet by mouth daily.  10/07/2016: Currently holding.  . Cyanocobalamin (VITAMIN B-12 PO) Take 1 tablet by mouth daily.  10/07/2016: Currently holding.  . naproxen sodium (ALEVE) 220 MG tablet Take 1 tablet (220 mg total) by mouth 2 (two) times daily with a meal. (Patient not taking: Reported on 10/16/2016)   . pyridOXINE (VITAMIN B-6) 100 MG tablet Take 100 mg by mouth daily. 10/07/2016: Currently holding.  . Red Yeast Rice Extract (RED YEAST RICE PO) Take 2 tablets by mouth daily. 10/07/2016: Currently holding.  . thiamine (VITAMIN B-1) 100 MG tablet Take 100 mg by mouth daily.  10/07/2016: Currently holding.  . TURMERIC CURCUMIN PO Take  2 tablets by mouth daily. 10/07/2016: Currently holding.   No facility-administered encounter medications on file as of 10/16/2016.    Objective: Per chart and [facility] point of care tool review, patient hospitalized 10/11/16 - 10/14/16.   Patient has a history of rheumatoid arthritis, hypertension, and hyperlipidemia.   [facility] Care Management preoperative call  completed on 10/10/16.    Assessment: Received [facility] Transition of care referral on 10/14/16 via [employer] report.  Transition of care referral completed, no care management needs, and will proceed with case closure.  Plan: RNCM will send successful outreach letter, [facility] pamphlet, and magnet. RNCM will send case closure due to follow up completed / no care management needs request to [facility].  Jashay Roddy H. Annia Friendly, BSN, [place] Management [facility] Telephonic CM Phone: [phone] Fax: [phone]

## 2016-10-17 NOTE — Telephone Encounter (Signed)
Please call patient. I hope she is doing well and recovering from her surgery. I am not sure why the discharge summary advises her to stop the Toprol-XL. This is not typically a medication that should be stopped suddenly. She should call her surgeon's office to inquire why this recommendation was made, as it appears the discharge summary was signed by her surgeon. Then I would recommend that she please call us as she should stop this medication slowly if possible if there is a reason she needed to come off of it. She should call her rheumatologist about the plaquenil as this is not a medication that I prescribed for her.

## 2016-10-17 NOTE — Telephone Encounter (Signed)
I called the pt and informed her of the message below and she stated she will call the surgeon's office and call us back.

## 2016-10-18 DIAGNOSIS — M069 Rheumatoid arthritis, unspecified: Secondary | ICD-10-CM | POA: Diagnosis not present

## 2016-10-18 DIAGNOSIS — Z4789 Encounter for other orthopedic aftercare: Secondary | ICD-10-CM | POA: Diagnosis not present

## 2016-10-18 DIAGNOSIS — M4316 Spondylolisthesis, lumbar region: Secondary | ICD-10-CM | POA: Diagnosis not present

## 2016-10-21 DIAGNOSIS — M4316 Spondylolisthesis, lumbar region: Secondary | ICD-10-CM | POA: Diagnosis not present

## 2016-10-21 DIAGNOSIS — M069 Rheumatoid arthritis, unspecified: Secondary | ICD-10-CM | POA: Diagnosis not present

## 2016-10-21 DIAGNOSIS — Z4789 Encounter for other orthopedic aftercare: Secondary | ICD-10-CM | POA: Diagnosis not present

## 2016-10-22 MED FILL — sulfaSALAzine 500 MG TABS: 500 | 30 days supply | Qty: 120 | Fill #2

## 2016-10-24 DIAGNOSIS — M069 Rheumatoid arthritis, unspecified: Secondary | ICD-10-CM | POA: Diagnosis not present

## 2016-10-24 DIAGNOSIS — Z4789 Encounter for other orthopedic aftercare: Secondary | ICD-10-CM | POA: Diagnosis not present

## 2016-10-24 DIAGNOSIS — M4316 Spondylolisthesis, lumbar region: Secondary | ICD-10-CM | POA: Diagnosis not present

## 2016-10-28 DIAGNOSIS — M069 Rheumatoid arthritis, unspecified: Secondary | ICD-10-CM | POA: Diagnosis not present

## 2016-10-28 DIAGNOSIS — Z4789 Encounter for other orthopedic aftercare: Secondary | ICD-10-CM | POA: Diagnosis not present

## 2016-10-28 DIAGNOSIS — M4316 Spondylolisthesis, lumbar region: Secondary | ICD-10-CM | POA: Diagnosis not present

## 2016-10-28 MED FILL — tiZANidine HCL 4 MG TABS: 4 | 15 days supply | Qty: 60 | Fill #1

## 2016-11-04 DIAGNOSIS — M545 Low back pain: Secondary | ICD-10-CM | POA: Diagnosis not present

## 2016-11-04 DIAGNOSIS — Z6837 Body mass index (BMI) 37.0-37.9, adult: Secondary | ICD-10-CM | POA: Diagnosis not present

## 2016-11-04 DIAGNOSIS — M9983 Other biomechanical lesions of lumbar region: Secondary | ICD-10-CM | POA: Diagnosis not present

## 2016-11-04 DIAGNOSIS — M5416 Radiculopathy, lumbar region: Secondary | ICD-10-CM | POA: Diagnosis not present

## 2016-11-04 DIAGNOSIS — M5126 Other intervertebral disc displacement, lumbar region: Secondary | ICD-10-CM | POA: Diagnosis not present

## 2016-11-11 MED FILL — tiZANidine HCL 4 MG TABS: 4 | 15 days supply | Qty: 30 | Fill #0

## 2016-11-13 NOTE — Anesthesia Postprocedure Evaluation (Signed)
Anesthesia Post Note  Patient: [name]  Procedure(s) Performed: Procedure(s) (LRB): Lumbar one-Sacral one Maximum access posterior lumbar interbody fusion (N/A)  Patient location during evaluation: PACU Anesthesia Type: General Level of consciousness: awake and sedated Pain management: pain level controlled Vital Signs Assessment: post-procedure vital signs reviewed and stable Respiratory status: spontaneous breathing, nonlabored ventilation, respiratory function stable and patient connected to nasal cannula oxygen Cardiovascular status: blood pressure returned to baseline and stable Postop Assessment: no signs of nausea or vomiting Anesthetic complications: no       Last Vitals:  Vitals:   10/14/16 0911 10/14/16 0946  BP: (!) 125/52   Pulse:  82  Resp:  19  Temp:  36.7 C    Last Pain:  Vitals:   10/14/16 0946  TempSrc: Oral  PainSc:                  Marianne Golightly,JAMES TERRILL

## 2016-11-20 ENCOUNTER — Other Ambulatory Visit: Payer: Self-pay | Admitting: Family Medicine

## 2016-11-20 MED FILL — LOSARTAN-HCTZ 100-25 MG TAB: 100-25 | 90 days supply | Qty: 90 | Fill #0

## 2016-11-20 NOTE — Telephone Encounter (Signed)
Sent to the pharmacy by e-scribe.  Pt has upcoming appt for follow up amlodipine and labs on 01/09/17

## 2016-11-21 ENCOUNTER — Telehealth: Payer: Self-pay | Admitting: Family Medicine

## 2016-11-21 DIAGNOSIS — G629 Polyneuropathy, unspecified: Secondary | ICD-10-CM

## 2016-11-21 NOTE — Telephone Encounter (Signed)
Pt would like to have a referral to Dr. Narda Amber @ [facility] for the pain that she is still having in her feet after back surgery and pt prefers to see Dr. 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

## 2016-11-22 ENCOUNTER — Encounter: Payer: Self-pay | Admitting: Neurology

## 2016-11-22 MED FILL — sulfaSALAzine 500 MG TABS: 500 | 30 days supply | Qty: 120 | Fill #0

## 2016-11-22 NOTE — Telephone Encounter (Signed)
Referral placed and I called the pt and informed her someone will call with appt info. 

## 2016-11-22 NOTE — Telephone Encounter (Signed)
Ok to refer per her request.

## 2016-11-25 ENCOUNTER — Ambulatory Visit: Payer: 59 | Admitting: Rheumatology

## 2016-11-28 MED FILL — tiZANidine HCL 4 MG TABS: 4 | 15 days supply | Qty: 30 | Fill #0

## 2016-12-12 ENCOUNTER — Ambulatory Visit (INDEPENDENT_AMBULATORY_CARE_PROVIDER_SITE_OTHER): Payer: 59 | Admitting: Family Medicine

## 2016-12-12 ENCOUNTER — Encounter: Payer: Self-pay | Admitting: Family Medicine

## 2016-12-12 ENCOUNTER — Telehealth: Payer: Self-pay | Admitting: Family Medicine

## 2016-12-12 VITALS — BP 122/72 | HR 78 | Temp 98.2°F | Ht 66.0 in | Wt 229.1 lb

## 2016-12-12 DIAGNOSIS — R600 Localized edema: Secondary | ICD-10-CM | POA: Diagnosis not present

## 2016-12-12 DIAGNOSIS — I10 Essential (primary) hypertension: Secondary | ICD-10-CM

## 2016-12-12 MED ORDER — FUROSEMIDE 20 MG PO TABS: 20.0000 mg | ORAL_TABLET | Freq: Every day | ORAL | 3 refills | Status: DC

## 2016-12-12 MED ORDER — LOSARTAN POTASSIUM 100 MG PO TABS: 100.0000 mg | ORAL_TABLET | Freq: Every day | ORAL | 3 refills | Status: DC

## 2016-12-12 MED ORDER — METOPROLOL SUCCINATE ER 50 MG PO TB24: 50.0000 mg | ORAL_TABLET | Freq: Every day | ORAL | 3 refills | Status: DC

## 2016-12-12 MED FILL — LOSARTAN POTASSIUM 100 MG T: 100 | 90 days supply | Qty: 90 | Fill #0

## 2016-12-12 MED FILL — FUROSEMIDE 20 MG TABLET: 20 | 30 days supply | Qty: 30 | Fill #0

## 2016-12-12 NOTE — Telephone Encounter (Signed)
Pt would like jo ann to return her call concerning side effect of amlodipine . Pt no longer want to take med

## 2016-12-12 NOTE — Telephone Encounter (Signed)
I called the pt and scheduled an appt for today at 4:30pm. 

## 2016-12-12 NOTE — Progress Notes (Signed)
HPI:  Acute visit for several issues. She reports chronic (> 3 months and unclear when it started) intermittent spells of feeling chilled at night. She has chronic pain and swelling in the feet (per notes this was mentioned as far back as 2015). She has burning pain in the feet - is seeing neurologist for this and has diagnosed peripheral neuropathy. She thinks her Norvasc was held during a hospitalization for back surgery in February and that when it was held the swelling, foot pain and chills at night resolved, then recurred when medications were restarted when she got home. She feels the Norvasc may be contributing to all of these symptoms and wants to stop it. She is very bothered by the swelling. Feels she can not go out with her feet looking like this. Denies CP, SOB, DOE, fevers.  ROS: See pertinent positives and negatives per HPI.  Past Medical History:  Diagnosis Date  . Allergy   . Cataract    BILATERAL-REMOVED 2 YEARS AGO  . GERD (gastroesophageal reflux disease)   . Hx of colonic polyp - ssp 11/03/2014  . Hypertension   . 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 Osteopenia 10/17/2011   DEXA 09/2007: -1.4 L fem; 10/2011: -1.2 L fem   . PONV (postoperative nausea and vomiting)   . Pseudogout of foot   . Rheumatoid arthritis(714.0) dx 2010    Past Surgical History:  Procedure Laterality Date  . BREAST BIOPSY  1972  . Broken wrist  2010  . CATARACT EXTRACTION  03/2012   left  . COLONOSCOPY    . COSMETIC SURGERY    . FRACTURE SURGERY    . INNER EAR SURGERY     busted ear drum  . MAXIMUM ACCESS (MAS)POSTERIOR LUMBAR INTERBODY FUSION (PLIF) 1 LEVEL N/A 10/11/2016   Procedure: Lumbar one-Sacral one Maximum access posterior lumbar interbody fusion;  Surgeon: Erline Levine, MD;  Location: [facility];  Service: Neurosurgery;  Laterality: N/A;  . pneumonia  2007    Family History  Problem Relation Age of Onset  . Stroke Mother   . Hypertension Father   . Heart attack Father   .  Hypertension Sister     x 3  . Hypertension Brother   . Hyperthyroidism Sister     x2, s/p RAI ablation  . Hypothyroidism Brother     Social History   Social History  . Marital status: Married    Spouse name: N/A  . Number of children: 3  . Years of education: N/A   Occupational History  . Retired    Social History Main Topics  . Smoking status: Former Smoker    Types: Cigarettes  . Smokeless tobacco: Former Systems developer    Quit date: 08/19/1981  . Alcohol use No  . Drug use: No  . Sexual activity: Not Asked   Other Topics Concern  . None   Social History Narrative   Artist -retired Building control surveyor   Married, lives with spouse, [name], he is IT support for [employer]   3 sons   2 caffeinated beverages a day   No regular exercise, diet is ok     Current Outpatient Prescriptions:  .  aspirin 81 MG tablet, Take 81 mg by mouth daily., Disp: , Rfl:  .  B Complex-C (B-COMPLEX WITH VITAMIN C) tablet, Take 2 tablets by mouth daily., Disp: , Rfl:  .  BIOTIN PO, Take 1 tablet by mouth daily. , Disp: , Rfl:  .  Cyanocobalamin (VITAMIN  B-12 PO), Take 1 tablet by mouth daily. , Disp: , Rfl:  .  gabapentin (NEURONTIN) 100 MG capsule, Take 2 capsules (200 mg total) by mouth 2 (two) times daily., Disp: 360 capsule, Rfl: 1 .  guaiFENesin (MUCINEX) 600 MG 12 hr tablet, Take 600 mg by mouth 2 (two) times daily as needed for cough or to loosen phlegm. , Disp: , Rfl:  .  hydroxychloroquine (PLAQUENIL) 200 MG tablet, Take 200 mg by mouth daily., Disp: , Rfl:  .  pyridOXINE (VITAMIN B-6) 100 MG tablet, Take 100 mg by mouth daily., Disp: , Rfl:  .  ranitidine (ZANTAC) 150 MG tablet, Take 150 mg by mouth daily as needed for heartburn. , Disp: , Rfl:  .  Red Yeast Rice Extract (RED YEAST RICE PO), Take 2 tablets by mouth daily., Disp: , Rfl:  .  sulfaSALAzine (AZULFIDINE) 500 MG EC tablet, TAKE 2 TABLETS BY MOUTH TWICE DAILY, Disp: 120 tablet, Rfl: 0 .  thiamine (VITAMIN B-1) 100 MG  tablet, Take 100 mg by mouth daily. , Disp: , Rfl:  .  tiZANidine (ZANAFLEX) 4 MG tablet, Take 0.5-1 tablets (2-4 mg total) by mouth every 6 (six) hours as needed for muscle spasms., Disp: 60 tablet, Rfl: 1 .  TURMERIC CURCUMIN PO, Take 2 tablets by mouth daily., Disp: , Rfl:  .  furosemide (LASIX) 20 MG tablet, Take 1 tablet (20 mg total) by mouth daily., Disp: 30 tablet, Rfl: 3 .  losartan (COZAAR) 100 MG tablet, Take 1 tablet (100 mg total) by mouth daily., Disp: 90 tablet, Rfl: 3 .  metoprolol succinate (TOPROL-XL) 50 MG 24 hr tablet, Take 1 tablet (50 mg total) by mouth daily. Take with or immediately following a meal., Disp: 90 tablet, Rfl: 3  EXAM:  Vitals:   12/12/16 1607  BP: 122/72  Pulse: 78  Temp: 98.2 F (36.8 C)    Body mass index is 36.98 kg/m.  GENERAL: vitals reviewed and listed above, alert, oriented, appears well hydrated and in no acute distress  HEENT: atraumatic, conjunttiva clear, no obvious abnormalities on inspection of external nose and ears  NECK: no obvious masses on inspection  LUNGS: clear to auscultation bilaterally, no wheezes, rales or rhonchi, good air movement  CV: HRRR, tr-1+ swelling in feet and ankles  MS: moves all extremities without noticeable abnormality  PSYCH: pleasant and cooperative, no obvious depression or anxiety  ASSESSMENT AND PLAN:  Discussed the following assessment and plan:  Lower extremity edema  Essential hypertension  -I cannot see where norvasc was held in the hospital, looks like metoprolol was held on discharge, and she reports it was restarted and she is taking it -she has had these issues for many yeats and I am not sure the norvasc is contributing, but she would like to try a trial off of it -discussed alt options for her blood pressure  -opted to stop norvasc, try lasix in place of hxtz -close follow up in 2 weeks -advised f/u with neurologist and rheumatologist as other symptoms not likely caused by the  norvasc and more likely related to her neuropathy and RA -Patient advised to return or notify a doctor immediately if symptoms worsen or persist or new concerns arise.  Patient Instructions  BEFORE YOU LEAVE: -follow up: 2 weeks  For Blood pressure and concerns regarding swelling in the feet and ankles:  STOP the following medications: 1) Norvasc (amlodipine) 2) Hyzaar (losartan-hctz 100-25)  START the following medications: 1) Lasix 20mg  daily 2) Losartan  100mg  daily  Continue the Metoprolol 50mg  twice daily  Elevate legs for 30 minutes twice daily.  Follow up with your neurologist and your rheumatologist about the other symptoms.   Colin Benton R., DO

## 2016-12-12 NOTE — Telephone Encounter (Signed)
Can you help her get appt so we can eval and help with choosing a different medication? Thanks.

## 2016-12-12 NOTE — Telephone Encounter (Signed)
I called the pt and she stated she does not want to take Amlodipine any longer due to: 1) increased swelling in both feet, to the point she can no longer wear shoes 2) has had "shakes" and chills, temp not taken and she did not have these symptoms when she was off of this medication while in the hospital after back surgery.  She has not taken her dose for today and questions what to do?  Message sent to Dr Maudie Mercury.

## 2016-12-12 NOTE — Patient Instructions (Signed)
BEFORE YOU LEAVE: -follow up: 2 weeks  For Blood pressure and concerns regarding swelling in the feet and ankles:  STOP the following medications: 1) Norvasc (amlodipine) 2) Hyzaar (losartan-hctz 100-25)  START the following medications: 1) Lasix 20mg  daily 2) Losartan 100mg  daily  Continue the Metoprolol 50mg  twice daily  Elevate legs for 30 minutes twice daily.  Follow up with your neurologist and your rheumatologist about the other symptoms.

## 2016-12-12 NOTE — Progress Notes (Signed)
Pre visit review using our clinic review tool, if applicable. No additional management support is needed unless otherwise documented below in the visit note. 

## 2016-12-16 DIAGNOSIS — M9983 Other biomechanical lesions of lumbar region: Secondary | ICD-10-CM | POA: Diagnosis not present

## 2016-12-18 ENCOUNTER — Encounter: Payer: Self-pay | Admitting: Neurology

## 2016-12-18 ENCOUNTER — Other Ambulatory Visit (INDEPENDENT_AMBULATORY_CARE_PROVIDER_SITE_OTHER): Payer: 59

## 2016-12-18 ENCOUNTER — Ambulatory Visit (INDEPENDENT_AMBULATORY_CARE_PROVIDER_SITE_OTHER): Payer: 59 | Admitting: Neurology

## 2016-12-18 ENCOUNTER — Other Ambulatory Visit: Payer: 59

## 2016-12-18 VITALS — BP 152/84 | HR 69 | Temp 97.6°F | Ht 66.0 in | Wt 234.0 lb

## 2016-12-18 DIAGNOSIS — I7381 Erythromelalgia: Secondary | ICD-10-CM | POA: Diagnosis not present

## 2016-12-18 DIAGNOSIS — G629 Polyneuropathy, unspecified: Secondary | ICD-10-CM

## 2016-12-18 LAB — VITAMIN B12: Vitamin B-12: 405 pg/mL (ref 211–911)

## 2016-12-18 LAB — TSH: TSH: 0.89 u[IU]/mL (ref 0.35–4.50)

## 2016-12-18 MED ORDER — NORTRIPTYLINE HCL 10 MG PO CAPS: ORAL_CAPSULE | ORAL | 5 refills | Status: DC

## 2016-12-18 MED FILL — NORTRIPTYLINE HCL 10 MG CAP: 10 | 30 days supply | Qty: 60 | Fill #0

## 2016-12-18 NOTE — Patient Instructions (Addendum)
1.  Start nortriptyline 10mg  at bedtime for 2 week, then increase to 2 tablet at bedtime 2.  NCS/EMG of the legs 3.  Check labs 4.  Follow-up with Dr. Amil Amen and discuss the diagnosis of erythromelalgia  Return to clinic in 3 months

## 2016-12-18 NOTE — Progress Notes (Signed)
[name] [name]   Dear Dr. Maudie Mercury:  Thank you for your kind referral of [name] for consultation of bilateral feet pain. Although her history is well known to you, please allow [place] to reiterate it for the purpose of our medical record. The patient was accompanied to the clinic by self.  History of Present Illness: [name] is a 66 y.o. right-handed Caucasian female with hypertension, GERD, rheumatoid arthritis, status post L5-S1 lumbar fusion and decompression presenting for evaluation of bilateral feet pain.    Starting around 5 years ago, she started redness and swelling of the toes and feet.  Later, she began having burning and firey sensation of the toes.  She also has numbness of the feet.  Over the past two years, her burning pain, swelling, and redness has progressively worsened and now involve both feet to the level of the ankles. Her toes can swell so much that it prohibits her from moving her toes at times.   She gets significant relief of symptoms with soaking her feet in ice water.  Activity and heat exacerbates her symptoms.  She has previously seen cardiology who did not feel symptoms were vascular in origin.  She went to [facility] and started her on gabapentin 200mg  twice daily, which provides some relief.   She was unable to tolerate any higher dose due to sedation.  She did not tolerate Lyrica due to hair loss. She started vitamin B12 1042mcg and B1 for neuropathy after reading that this may help.   She was diagnosed with rheumatoid arthritis around the same time that her feet started to change color and cause pain.  She takes a daily aspirin, but does not appreciate that this improves her pain any.  She sees Dr. Amil Amen for RA and takes plaquenil and sulfasalazine.   She has long history of low back and bilateral leg pain worse on the right  which was exacerbated in 2017 due to a slip. Around that time, she started having right leg shooting pain radiating from her back to her knee. MRI lumbar spine from August 2017 showed narrowing at left L3-4, mild arthritis at L4-5 left>right, severe arthritis at L5-S1 with L5 nerve impingement on the right.  Due to ongoing pain, she underwent decompression and fusion L5-S1 with significant improvement in back and leg pain  on 10/11/2016 by Dr. Vertell Limber and is very pleased with her results.  Out-side paper records, electronic medical record, and images have been reviewed where available and summarized as:  MRI lumbar spine 03/19/2016: 1. Severe facet arthrosis at L5-S1 with grade 1 anterolisthesis and moderate right lateral recess stenosis, potentially affecting the right S1 nerve. 2. Mild right and moderate left lateral recess stenosis at L4-5. 3. Moderate left lateral recess stenosis at L3-4.  EKG 10/10/2016:  QTc 442  Lab Results  Component Value Date   HGBA1C 5.2 09/12/2016    Past Medical History:  Diagnosis Date  . Allergy   . Cataract    BILATERAL-REMOVED 2 YEARS AGO  . GERD (gastroesophageal reflux disease)   . Hx of colonic polyp - ssp 11/03/2014  . Hypertension   . 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 Osteopenia 10/17/2011   DEXA 09/2007: -1.4 L fem; 10/2011: -1.2 L fem   . PONV (postoperative nausea and vomiting)   . Pseudogout of foot   . Rheumatoid arthritis(714.0) dx 2010  Past Surgical History:  Procedure Laterality Date  . BREAST BIOPSY  1972  . Broken wrist  2010  . CATARACT EXTRACTION  03/2012   left  . COLONOSCOPY    . COSMETIC SURGERY    . FRACTURE SURGERY    . INNER EAR SURGERY     busted ear drum  . MAXIMUM ACCESS (MAS)POSTERIOR LUMBAR INTERBODY FUSION (PLIF) 1 LEVEL N/A 10/11/2016   Procedure: Lumbar one-Sacral one Maximum access posterior lumbar interbody fusion;  Surgeon: Erline Levine, MD;  Location: [place];  Service: Neurosurgery;  Laterality: N/A;  . pneumonia   2007     Medications:  Outpatient Encounter Prescriptions as of 12/18/2016  Medication Sig Note  . aspirin 81 MG tablet Take 81 mg by mouth daily.   . B Complex-C (B-COMPLEX WITH VITAMIN C) tablet Take 2 tablets by mouth daily.   Marland Kitchen BIOTIN PO Take 1 tablet by mouth daily.    . Cyanocobalamin (VITAMIN B-12 PO) Take 1 tablet by mouth daily.    . furosemide (LASIX) 20 MG tablet Take 1 tablet (20 mg total) by mouth daily.   Marland Kitchen gabapentin (NEURONTIN) 100 MG capsule Take 2 capsules (200 mg total) by mouth 2 (two) times daily.   Marland Kitchen guaiFENesin (MUCINEX) 600 MG 12 hr tablet Take 600 mg by mouth 2 (two) times daily as needed for cough or to loosen phlegm.    Marland Kitchen losartan (COZAAR) 100 MG tablet Take 1 tablet (100 mg total) by mouth daily.   . metoprolol succinate (TOPROL-XL) 50 MG 24 hr tablet Take 1 tablet (50 mg total) by mouth daily. Take with or immediately following a meal.   . pyridOXINE (VITAMIN B-6) 100 MG tablet Take 100 mg by mouth daily.   . ranitidine (ZANTAC) 150 MG tablet Take 150 mg by mouth daily as needed for heartburn.    . Red Yeast Rice Extract (RED YEAST RICE PO) Take 2 tablets by mouth daily. 10/07/2016: Currently holding.  . sulfaSALAzine (AZULFIDINE) 500 MG EC tablet TAKE 2 TABLETS BY MOUTH TWICE DAILY   . thiamine (VITAMIN B-1) 100 MG tablet Take 100 mg by mouth daily.  10/07/2016: Currently holding.  Marland Kitchen tiZANidine (ZANAFLEX) 4 MG tablet Take 0.5-1 tablets (2-4 mg total) by mouth every 6 (six) hours as needed for muscle spasms.   . [DISCONTINUED] TURMERIC CURCUMIN PO Take 2 tablets by mouth daily. 10/07/2016: Currently holding.  . hydroxychloroquine (PLAQUENIL) 200 MG tablet Take 200 mg by mouth daily. 12/18/2016: Not taking  . nortriptyline (PAMELOR) 10 MG capsule Nortriptyline 10mg  at bedtime for 2 week, then increase to 2 tablet at bedtime    No facility-administered encounter medications on file as of 12/18/2016.      Allergies:  Allergies  Allergen Reactions  . Amlodipine  Besylate     Tremors    Family History: Family History  Problem Relation Age of Onset  . Stroke Mother   . Hypertension Father   . Heart attack Father   . Hypertension Sister     x 3  . Hypertension Brother   . Hyperthyroidism Sister     x2, s/p RAI ablation  . Hypothyroidism Brother     Social History: Social History  Substance Use Topics  . Smoking status: Former Smoker    Types: Cigarettes  . Smokeless tobacco: Former Systems developer    Quit date: 08/19/1981  . Alcohol use No   Social History   Social History Narrative   Artist -retired Building control surveyor   Married, lives with  spouse, [name], he is IT support for [employer]   3 sons   2 caffeinated beverages a day   No regular exercise, diet is ok    Review of Systems:  CONSTITUTIONAL: No fevers, chills, night sweats, or weight loss.   EYES: No visual changes or eye pain ENT: No hearing changes.  No history of nose bleeds.   RESPIRATORY: No cough, wheezing and shortness of breath.   CARDIOVASCULAR: Negative for chest pain, and palpitations.   GI: Negative for abdominal discomfort, blood in stools or black stools.  No recent change in bowel habits.   GU:  No history of incontinence.   MUSCLOSKELETAL: +history of joint pain or swelling.  No myalgias.   SKIN: Negative for lesions, rash, and itching.   HEMATOLOGY/ONCOLOGY: Negative for prolonged bleeding, bruising easily, and swollen nodes.  No history of cancer.   ENDOCRINE: Negative for cold or heat intolerance, polydipsia or goiter.   PSYCH:  No depression or anxiety symptoms.   NEURO: As Above.   Vital Signs:  BP (!) 152/84   Pulse 69   Temp 97.6 F (36.4 C) (Oral)   Ht 5\' 6"  (1.676 m)   Wt 234 lb (106.1 kg)   SpO2 94%   BMI 37.77 kg/m    General Medical Exam:   General:  Well appearing, comfortable.   Eyes/ENT: see cranial nerve examination.   Neck: No masses appreciated.  Full range of motion without tenderness.  No carotid bruits. Respiratory:   Clear to auscultation, good air entry bilaterally.   Cardiac:  Regular rate and rhythm, no murmur.   Extremities:  Bilateral feet with prominent erythema and swelling of the feet and toes.  Feet are warm to touch.  Neurological Exam: MENTAL STATUS including orientation to time, place, person, recent and remote memory, attention span and concentration, language, and fund of knowledge is normal.  Speech is not dysarthric.  CRANIAL NERVES: II:  No visual field defects.  Unremarkable fundi.   III-IV-VI: Pupils equal round and reactive to light.  Normal conjugate, extra-ocular eye movements in all directions of gaze.  No nystagmus.  Right ptosis (mild, old).   V:  Normal facial sensation.   VII:  Normal facial symmetry and movements.    VIII:  Normal hearing and vestibular function.   IX-X:  Normal palatal movement.   XI:  Normal shoulder shrug and head rotation.   XII:  Normal tongue strength and range of motion, no deviation or fasciculation.  MOTOR:  No atrophy, fasciculations or abnormal movements.  No pronator drift.  Tone is normal.    Right Upper Extremity:    Left Upper Extremity:    Deltoid  5/5   Deltoid  5/5   Biceps  5/5   Biceps  5/5   Triceps  5/5   Triceps  5/5   Wrist extensors  5/5   Wrist extensors  5/5   Wrist flexors  5/5   Wrist flexors  5/5   Finger extensors  5/5   Finger extensors  5/5   Finger flexors  5/5   Finger flexors  5/5   Dorsal interossei  5/5   Dorsal interossei  5/5   Abductor pollicis  5/5   Abductor pollicis  5/5   Tone (Ashworth scale)  0  Tone (Ashworth scale)  0   Right Lower Extremity:    Left Lower Extremity:    Hip flexors  5/5   Hip flexors  5/5   Hip  extensors  5/5   Hip extensors  5/5   Knee flexors  5/5   Knee flexors  5/5   Knee extensors  5/5   Knee extensors  5/5   Dorsiflexors  5/5   Dorsiflexors  5/5   Plantarflexors  5/5   Plantarflexors  5/5   Toe extensors  5/5   Toe extensors  5/5   Toe flexors  5/5   Toe flexors  5/5   Tone  (Ashworth scale)  0  Tone (Ashworth scale)  0   MSRs:  Right                                                                 Left brachioradialis 2+  brachioradialis 2+  biceps 2+  biceps 2+  triceps 2+  triceps 2+  patellar 2+  patellar 2+  ankle jerk 0  ankle jerk 0  Hoffman no  Hoffman no  plantar response down  plantar response down   SENSORY: Vibration is reduced to 20% at the great toe bilaterally.  Temperature, pin prick, and light touch intact throughout.  Romberg's sign absent.   COORDINATION/GAIT: Normal finger-to- nose-finger.  Intact rapid alternating movements bilaterally.  Gait narrow based and stable. Tandem and stressed gait intact.    IMPRESSION: [name] is a 66 year old female referred for evaluation of bilateral feet pain.  On exam, she has prominent erythema and swelling of the feet.  Vibration is the only sensory modality reduced at the great toe, otherwise pin prick, light touch, and temperature is intact.  Her swelling and hyperemia are not typical features of neuropathy and more consistent with erythromelalgia, which can be associated with autoimmune conditions such as rheumatoid arthritis.  She may have mild neuropathy underlying to these symptoms, but I do not see that it is the primary contributor to her pain.    To better determine the degree of neuropathy, NCS/EMG of the legs will be ordered; however, will need to keep in mind that the results may be hampered by the fact that she has prominent edema of the legs.  To assess for secondary causes of neuropathy, TSH, vitamin B12, copper, SPEP with IFE will be checked.  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  In the meantime, I will start nortriptyline 10mg  at bedtime and titrate to 20mg  at bedtime for her pain.  Continue gapapentin 200mg  twice daily.  Return to clinic in 4 months.   The duration of this  appointment visit was 50 minutes of face-to-face time with the patient.  Greater than 50% of this time was spent in counseling, explanation of diagnosis, planning of further management, and coordination of care.   Thank you for allowing me to participate in patient's care.  If I can answer any additional questions, I would be pleased to do so.    Sincerely,    Landin Tallon K. Posey Pronto, DO

## 2016-12-19 MED FILL — sulfaSALAzine 500 MG TABS: 500 | 30 days supply | Qty: 120 | Fill #1

## 2016-12-19 NOTE — Progress Notes (Signed)
Note faxed.

## 2016-12-20 ENCOUNTER — Telehealth: Payer: Self-pay

## 2016-12-20 DIAGNOSIS — R600 Localized edema: Secondary | ICD-10-CM

## 2016-12-20 LAB — PROTEIN ELECTROPHORESIS, SERUM
Albumin ELP: 3.9 g/dL (ref 3.8–4.8)
Alpha-1-Globulin: 0.3 g/dL (ref 0.2–0.3)
Alpha-2-Globulin: 0.6 g/dL (ref 0.5–0.9)
Beta 2: 0.3 g/dL (ref 0.2–0.5)
Beta Globulin: 0.5 g/dL (ref 0.4–0.6)
Gamma Globulin: 1.2 g/dL (ref 0.8–1.7)
Total Protein, Serum Electrophoresis: 6.8 g/dL (ref 6.1–8.1)

## 2016-12-20 LAB — IMMUNOFIXATION ELECTROPHORESIS
IGA: 201 mg/dL (ref 81–463)
IGG (IMMUNOGLOBIN G), SERUM: 1222 mg/dL (ref 694–1618)
IGM, SERUM: 76 mg/dL (ref 48–271)

## 2016-12-20 LAB — COPPER, SERUM: COPPER: 91 ug/dL (ref 70–175)

## 2016-12-20 NOTE — Telephone Encounter (Signed)
Thanks! Was she ok with ordering the heart Echo as well? Ok to place order if she is agreeable.

## 2016-12-20 NOTE — Telephone Encounter (Signed)
Spoke with pt and she denies CP, ShOB or other cardiac s/s. She is scheduled for EMG with Dr. Posey Pronto in the near future. Explained that this will help determine neuropathy but not help with any vein issues. Gave recommendation to contact [facility] for urgent appt to review symptoms. She agrees and will call them today. Nothing further needed at this time.   Dr. Maudie Mercury - FYI. Thanks!

## 2016-12-20 NOTE — Telephone Encounter (Signed)
Patient called to report that since starting Lasix she has not noticed an improvement in her BLE edema. She has not noticed any increase in frequency of urination. She has gained 5lbs since LOV. She would like to know what you would recommend.  Dr. Maudie Mercury - Please advise. Thanks!

## 2016-12-20 NOTE — Telephone Encounter (Signed)
I am not sure what else we can do for her legs if the lasix is not helping and she can not tolerate compression or elevation. Would advise follow up with [facility] about this - I think from reading their notes they were planning to do testing for vein issues. Would advise Echo since she had weight gain to ensure heart ok. If having trouble breathing, CP, SOB advise appt today here also.

## 2016-12-20 NOTE — Telephone Encounter (Signed)
Echo ordered.

## 2016-12-25 ENCOUNTER — Other Ambulatory Visit: Payer: 59

## 2016-12-25 NOTE — Progress Notes (Signed)
HPI:  Follow up chronic LE edema and hypertension. Saw vasc in th past for this. Held norvasc and did trial lasix last visit. She doesn't think this helped and is quite distraught about the swelling. She reported in the past that she does not tolerate compression or elevation due to other health issues (neuropathy in feet and back pain - sees specialist for these issues.) Today reports has difficulty putting on compression socks and difficulty stacking pillow on bed to elevated feet. Ordered wedge to elevated feet. Ordered echo and referred her back to vascular doc - these evaluations are pending. She is not getting any activity. No SOB, DOE, orthopnea, CP, palpitations. 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 Neurologist also started nortriptyline and ordered several labs (IFEP, SPEP, copper, TSH, B12 - all ok) and NCS/EMG for her foot pain issues. Appreciate care. Due for mammogram.   ROS: See pertinent positives and negatives per HPI.  Past Medical History:  Diagnosis Date  . Allergy   . Cataract    BILATERAL-REMOVED 2 YEARS AGO  . GERD (gastroesophageal reflux disease)   . Hx of colonic polyp - ssp 11/03/2014  . Hypertension   . 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 Osteopenia 10/17/2011   DEXA 09/2007: -1.4 L fem; 10/2011: -1.2 L fem   . PONV (postoperative nausea and vomiting)   . Pseudogout of foot   . Rheumatoid arthritis(714.0) dx 2010    Past Surgical History:  Procedure Laterality Date  . BREAST BIOPSY  1972  . Broken wrist  2010  . CATARACT EXTRACTION  03/2012   left  . COLONOSCOPY    . COSMETIC SURGERY    . FRACTURE SURGERY    . INNER EAR SURGERY     busted ear drum  . MAXIMUM ACCESS (MAS)POSTERIOR LUMBAR INTERBODY FUSION (PLIF) 1 LEVEL N/A 10/11/2016   Procedure: Lumbar one-Sacral one Maximum access posterior lumbar interbody fusion;  Surgeon: Erline Levine, MD;  Location: [facility];   Service: Neurosurgery;  Laterality: N/A;  . pneumonia  2007    Family History  Problem Relation Age of Onset  . Stroke Mother   . Hypertension Father   . Heart attack Father   . Hypertension Sister        x 3  . Hypertension Brother   . Hyperthyroidism Sister        x2, s/p RAI ablation  . Hypothyroidism Brother     Social History   Social History  . Marital status: Married    Spouse name: N/A  . Number of children: 3  . Years of education: N/A   Occupational History  . Retired    Social History Main Topics  . Smoking status: Former Smoker    Types: Cigarettes  . Smokeless tobacco: Former Systems developer    Quit date: 08/19/1981  . Alcohol use No  . Drug use: No  . Sexual activity: Not Asked   Other Topics Concern  . None   Social History Narrative   Artist -retired Building control surveyor   Married, lives with spouse, [name], he is IT support for [employer]   3 sons   2 caffeinated beverages a day   No regular exercise, diet is ok     Current Outpatient Prescriptions:  .  aspirin 81 MG tablet, Take 81 mg by mouth daily., Disp: , Rfl:  .  B Complex-C (B-COMPLEX WITH VITAMIN C) tablet, Take 2  tablets by mouth daily., Disp: , Rfl:  .  BIOTIN PO, Take 1 tablet by mouth daily. , Disp: , Rfl:  .  Cyanocobalamin (VITAMIN B-12 PO), Take 1 tablet by mouth daily. , Disp: , Rfl:  .  furosemide (LASIX) 20 MG tablet, Take 1 tablet (20 mg total) by mouth daily., Disp: 30 tablet, Rfl: 3 .  gabapentin (NEURONTIN) 100 MG capsule, Take 2 capsules (200 mg total) by mouth 2 (two) times daily., Disp: 360 capsule, Rfl: 1 .  guaiFENesin (MUCINEX) 600 MG 12 hr tablet, Take 600 mg by mouth 2 (two) times daily as needed for cough or to loosen phlegm. , Disp: , Rfl:  .  hydroxychloroquine (PLAQUENIL) 200 MG tablet, Take 200 mg by mouth daily., Disp: , Rfl:  .  losartan (COZAAR) 100 MG tablet, Take 1 tablet (100 mg total) by mouth daily., Disp: 90 tablet, Rfl: 3 .  metoprolol succinate  (TOPROL-XL) 50 MG 24 hr tablet, Take 1 tablet (50 mg total) by mouth daily. Take with or immediately following a meal., Disp: 90 tablet, Rfl: 3 .  nortriptyline (PAMELOR) 10 MG capsule, Nortriptyline 10mg  at bedtime for 2 week, then increase to 2 tablet at bedtime, Disp: 60 capsule, Rfl: 5 .  pyridOXINE (VITAMIN B-6) 100 MG tablet, Take 100 mg by mouth daily., Disp: , Rfl:  .  ranitidine (ZANTAC) 150 MG tablet, Take 150 mg by mouth daily as needed for heartburn. , Disp: , Rfl:  .  Red Yeast Rice Extract (RED YEAST RICE PO), Take 2 tablets by mouth daily., Disp: , Rfl:  .  sulfaSALAzine (AZULFIDINE) 500 MG EC tablet, TAKE 2 TABLETS BY MOUTH TWICE DAILY, Disp: 120 tablet, Rfl: 0 .  thiamine (VITAMIN B-1) 100 MG tablet, Take 100 mg by mouth daily. , Disp: , Rfl:  .  tiZANidine (ZANAFLEX) 4 MG tablet, Take 0.5-1 tablets (2-4 mg total) by mouth every 6 (six) hours as needed for muscle spasms., Disp: 60 tablet, Rfl: 1  EXAM:  Vitals:   12/26/16 1027  BP: 118/80  Pulse: 72  Temp: 98.2 F (36.8 C)    Body mass index is 37.87 kg/m.  GENERAL: vitals reviewed and listed above, alert, oriented, appears well hydrated and in no acute distress  HEENT: atraumatic, conjunttiva clear, no obvious abnormalities on inspection of external nose and ears  NECK: no obvious masses on inspection  LUNGS: clear to auscultation bilaterally, no wheezes, rales or rhonchi, good air movement  CV: HRRR, 1+ bilat ankle edema to mid calf  MS: moves all extremities without noticeable abnormality  PSYCH: pleasant and cooperative, no obvious depression or anxiety  ASSESSMENT AND PLAN:  Discussed the following assessment and plan:  Secondary hypertension  Leg edema - Plan: Basic metabolic panel  Pain in both feet  -echo, BMP since recently started lasix - BP ok on this regimen -discussed options for help with donning comp, elevation - wedge and comp sock donner advised -has f/u with vasc -has nerve  conduction testing pedning -lifestyle recs - advised considering water aerobics -Patient advised to return or notify a doctor immediately if symptoms worsen or persist or new concerns arise.  Patient Instructions  BEFORE YOU LEAVE: -lab -follow up: 3 months  Try the compression donner (available on amazon) to help with getting the compression sock on.  See the Vascular doctor, get the Echo and the nerve testing as planned.  Try the nortriptyline the neurologist suggested to see if this helps the symptoms.    [name],  Jakeob Tullis R., DO

## 2016-12-26 ENCOUNTER — Encounter: Payer: Self-pay | Admitting: Family Medicine

## 2016-12-26 ENCOUNTER — Ambulatory Visit (INDEPENDENT_AMBULATORY_CARE_PROVIDER_SITE_OTHER): Payer: 59 | Admitting: Family Medicine

## 2016-12-26 VITALS — BP 118/80 | HR 72 | Temp 98.2°F | Ht 66.0 in | Wt 234.6 lb

## 2016-12-26 DIAGNOSIS — R600 Localized edema: Secondary | ICD-10-CM

## 2016-12-26 DIAGNOSIS — I159 Secondary hypertension, unspecified: Secondary | ICD-10-CM | POA: Diagnosis not present

## 2016-12-26 DIAGNOSIS — M79671 Pain in right foot: Secondary | ICD-10-CM | POA: Diagnosis not present

## 2016-12-26 DIAGNOSIS — M79672 Pain in left foot: Secondary | ICD-10-CM

## 2016-12-26 LAB — BASIC METABOLIC PANEL
BUN: 12 mg/dL (ref 6–23)
CO2: 27 mEq/L (ref 19–32)
CREATININE: 0.85 mg/dL (ref 0.40–1.20)
Calcium: 10 mg/dL (ref 8.4–10.5)
Chloride: 104 mEq/L (ref 96–112)
GFR: 70.7 mL/min (ref >60.00)
Glucose, Bld: 103 mg/dL — ABNORMAL HIGH (ref 70–99)
POTASSIUM: 4 meq/L (ref 3.5–5.1)
Sodium: 138 mEq/L (ref 135–145)

## 2016-12-26 MED FILL — HYDROXYCHLOROQUINE 200 MG T: 200 | 30 days supply | Qty: 30 | Fill #2

## 2016-12-26 MED FILL — METOPROLOL SUCC ER 50 MG TA: 50 | 90 days supply | Qty: 90 | Fill #0

## 2016-12-26 NOTE — Patient Instructions (Signed)
BEFORE YOU LEAVE: -lab -follow up: 3 months  Try the compression donner (available on amazon) to help with getting the compression sock on.  See the Vascular doctor, get the Echo and the nerve testing as planned.  Try the nortriptyline the neurologist suggested to see if this helps the symptoms.

## 2017-01-01 ENCOUNTER — Other Ambulatory Visit: Payer: Self-pay

## 2017-01-01 ENCOUNTER — Ambulatory Visit (HOSPITAL_COMMUNITY): Payer: 59 | Attending: Cardiovascular Disease

## 2017-01-01 DIAGNOSIS — R600 Localized edema: Secondary | ICD-10-CM | POA: Insufficient documentation

## 2017-01-01 DIAGNOSIS — I340 Nonrheumatic mitral (valve) insufficiency: Secondary | ICD-10-CM | POA: Insufficient documentation

## 2017-01-02 ENCOUNTER — Ambulatory Visit (INDEPENDENT_AMBULATORY_CARE_PROVIDER_SITE_OTHER): Payer: 59 | Admitting: Neurology

## 2017-01-02 DIAGNOSIS — I7381 Erythromelalgia: Secondary | ICD-10-CM

## 2017-01-02 DIAGNOSIS — G629 Polyneuropathy, unspecified: Secondary | ICD-10-CM | POA: Diagnosis not present

## 2017-01-02 MED FILL — GABAPENTIN 100 MG CAPSULE: 100 | 90 days supply | Qty: 360 | Fill #1

## 2017-01-02 NOTE — Procedures (Signed)
[facility]  [place], [address]  [place], [place] [address] Tel: [phone] Fax:  [phone] Test Date:  01/02/2017  Patient: [name] DOB: 03/10/1951 Physician: Narda Amber, DO  Sex: Female Height: 5\' 6"  Ref Phys: Narda Amber, DO  ID#: [id number] Temp: 35.9C Technician:    Patient Complaints: This is a 66 year-old female with rheumatoid arthritis and erythromelalgia referred for evaluation of bilateral feet pain.  NCV & EMG Findings: Extensive electrodiagnostic testing is somewhat hampered by patient's lower extremity edema. Findings are as follows:  1. Bilateral superficial peroneal sensory responses are absent. Left sural sensory response shows reduced amplitude. Right sural sensory responses within normal limits. 2. Bilateral peroneal motor responses are absent at the extensor digitorum brevis, and normal at the tibialis anterior. Bilateral tibial motor responses show reduced amplitude and proximal stimulation was technically challenging due to body physiognomy. 3. Moderate recruitment pattern was seen in the flexor digitorum longus muscles bilaterally, without accompanied changes in motor unit evaluation.   Impression: This study was technically challenged and limited by lower extremity edema.  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  Clinical correlation recommended.   ___________________________ Narda Amber, DO    Nerve Conduction Studies Anti Sensory Summary Table   Stim Site NR Peak (ms) Norm Peak (ms) P-T Amp (V) Norm P-T Amp  Left Sup Peroneal Anti Sensory (Ant Lat Mall)  35.9C  12 cm NR  <4.6  >3  Right Sup Peroneal Anti Sensory (Ant Lat Mall)  35.9C    Lower leg edema  12 cm NR  <4.6  >3  Left Sural Anti Sensory (Lat Mall)  35.9C  Calf    3.3 <4.6 2.7 >3  Right Sural Anti Sensory (Lat Mall)  35.9C  Calf    3.3  <4.6 5.4 >3   Motor Summary Table   Stim Site NR Onset (ms) Norm Onset (ms) O-P Amp (mV) Norm O-P Amp Site1 Site2 Delta-0 (ms) Dist (cm) Vel (m/s) Norm Vel (m/s)  Left Peroneal Motor (Ext Dig Brev)  35.9C    Lower leg edema  Ankle NR  <6.0  >2.5 B Fib Ankle  0.0  >40  B Fib NR     Poplt B Fib  0.0  >40  Poplt NR            Right Peroneal Motor (Ext Dig Brev)  35.9C  Ankle NR  <6.0  >2.5 B Fib Ankle  0.0  >40  B Fib NR     Poplt B Fib  0.0  >40  Poplt NR            Left Peroneal TA Motor (Tib Ant)  35.9C  Fib Head    3.2 <4.5 3.2 >3 Poplit Fib Head 1.4 8.0 57 >40  Poplit    4.6  2.9         Right Peroneal TA Motor (Tib Ant)  35.9C    Lower leg edema  Fib Head    3.1 <4.5 3.0 >3 Poplit Fib Head 1.4 8.0 57 >40  Poplit    4.5  3.0         Left Tibial Motor (Abd Hall Brev)  35.9C    body habitus behind knee  Ankle    4.1 <6.0 2.2 >4 Knee Ankle 9.7 0.0  >40  Knee    13.8  0.5  Right Tibial Motor (Abd Hall Brev)  35.9C    body habitus behind knee  Ankle    3.8 <6.0 1.9 >4 Knee Ankle  0.0  >40  Knee NR             EMG   Side Muscle Ins Act Fibs Psw Fasc Number Recrt Dur Dur. Amp Amp. Poly Poly. Comment  Left AntTibialis Nml Nml Nml Nml Nml Nml Nml Nml Nml Nml Nml Nml N/A  Left Gastroc Nml Nml Nml Nml Nml Nml Nml Nml Nml Nml Nml Nml N/A  Left Flex Dig Long Nml Nml Nml Nml 3- Mod-V Nml Nml Nml Nml Nml Nml N/A  Left RectFemoris Nml Nml Nml Nml Nml Nml Nml Nml Nml Nml Nml Nml N/A  Left GluteusMed Nml Nml Nml Nml Nml Nml Nml Nml Nml Nml Nml Nml N/A  Left BicepsFemS Nml Nml Nml Nml Nml Nml Nml Nml Nml Nml Nml Nml N/A  Right BicepsFemS Nml Nml Nml Nml Nml Nml Nml Nml Nml Nml Nml Nml N/A  Right AntTibialis Nml Nml Nml Nml Nml Nml Nml Nml Nml Nml Nml Nml N/A  Right Flex Dig Long Nml Nml Nml Nml 3- Mod-V Nml Nml Nml Nml Nml Nml N/A  Right RectFemoris Nml Nml Nml Nml Nml Nml Nml Nml Nml Nml Nml Nml N/A  Right GluteusMed Nml Nml Nml Nml Nml Nml Nml Nml Nml Nml Nml Nml N/A       Waveforms:

## 2017-01-06 ENCOUNTER — Telehealth: Payer: Self-pay | Admitting: *Deleted

## 2017-01-06 NOTE — Telephone Encounter (Signed)
Note faxed.

## 2017-01-06 NOTE — Telephone Encounter (Signed)
-----   Message from Alda Berthold, DO sent at 01/03/2017  4:58 PM EDT ----- Please fax these results to Dr. Leigh Aurora, rheumatology. Thanks.

## 2017-01-09 ENCOUNTER — Ambulatory Visit: Payer: 59 | Admitting: Family Medicine

## 2017-01-09 MED FILL — FUROSEMIDE 20 MG TABLET: 20 | 30 days supply | Qty: 30 | Fill #1

## 2017-01-10 DIAGNOSIS — M2550 Pain in unspecified joint: Secondary | ICD-10-CM | POA: Diagnosis not present

## 2017-01-10 DIAGNOSIS — E669 Obesity, unspecified: Secondary | ICD-10-CM | POA: Diagnosis not present

## 2017-01-10 DIAGNOSIS — M0609 Rheumatoid arthritis without rheumatoid factor, multiple sites: Secondary | ICD-10-CM | POA: Diagnosis not present

## 2017-01-10 DIAGNOSIS — M5136 Other intervertebral disc degeneration, lumbar region: Secondary | ICD-10-CM | POA: Diagnosis not present

## 2017-01-10 DIAGNOSIS — I7381 Erythromelalgia: Secondary | ICD-10-CM | POA: Diagnosis not present

## 2017-01-10 DIAGNOSIS — M5441 Lumbago with sciatica, right side: Secondary | ICD-10-CM | POA: Diagnosis not present

## 2017-01-10 DIAGNOSIS — Z6837 Body mass index (BMI) 37.0-37.9, adult: Secondary | ICD-10-CM | POA: Diagnosis not present

## 2017-01-15 ENCOUNTER — Other Ambulatory Visit: Payer: Self-pay | Admitting: Family Medicine

## 2017-01-15 DIAGNOSIS — Z1231 Encounter for screening mammogram for malignant neoplasm of breast: Secondary | ICD-10-CM

## 2017-01-19 MED FILL — HYDROXYCHLOROQUINE 200 MG T: 200 | 30 days supply | Qty: 60 | Fill #0

## 2017-01-21 MED FILL — sulfaSALAzine 500 MG TABS: 500 | 30 days supply | Qty: 120 | Fill #0

## 2017-01-27 ENCOUNTER — Encounter: Payer: Self-pay | Admitting: Surgery

## 2017-01-30 ENCOUNTER — Ambulatory Visit: Payer: Medicare Other

## 2017-01-30 NOTE — Addendum Note (Signed)
Addended by: Lianne Cure A on: 01/30/2017 10:21 AM   Modules accepted: Orders

## 2017-02-03 ENCOUNTER — Ambulatory Visit (HOSPITAL_COMMUNITY)
Admission: RE | Admit: 2017-02-03 | Discharge: 2017-02-03 | Disposition: A | Discharge status: Home / Self Care | Payer: 59 | Source: Ambulatory Visit | Attending: Surgery | Admitting: Surgery

## 2017-02-03 ENCOUNTER — Ambulatory Visit (INDEPENDENT_AMBULATORY_CARE_PROVIDER_SITE_OTHER): Payer: 59 | Admitting: Surgery

## 2017-02-03 ENCOUNTER — Encounter: Payer: Self-pay | Admitting: Surgery

## 2017-02-03 VITALS — BP 205/87 | HR 73 | Temp 98.0°F | Resp 20 | Ht 66.0 in | Wt 236.0 lb

## 2017-02-03 DIAGNOSIS — M7989 Other specified soft tissue disorders: Secondary | ICD-10-CM

## 2017-02-03 DIAGNOSIS — I872 Venous insufficiency (chronic) (peripheral): Secondary | ICD-10-CM | POA: Insufficient documentation

## 2017-02-03 NOTE — Progress Notes (Signed)
[facility] [place]  Patient name: [name] MRN: [id number] DOB: 1951/07/05 Sex: female   REASON FOR VISIT:    Follow up  HISOTRY OF PRESENT ILLNESS:    [name] is a 66 y.o. female, who is Her today for evaluation of poor circulation.  The patient states that she has been having pain in her feet for at least 2 years.  Currently her right leg bothers her more than the left.  She feels as if her feet are on fire.  They're very painful.  She also feels like her feet are going to explode.  She states that her symptoms are improved with Motrin as well as placing them in ice.  There are no aggravating factors.  Her pain appears to be constant.  She has tried Neurontin at high doses but could not tolerate it.  Lower dose Neurontin has minimal effect.  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 Vertell Limber and with neurosurgery within the next few weeks.  She states all of her symptoms got worse when she developed shingles approximately 2 years ago.  She does report having pain in her lower back.  Since I last saw her, she has undergone back surgery with Dr. Vertell Limber.  She still complains of pain in her feet as well as swelling.  She was unable to put on the compression stockings.  PAST MEDICAL HISTORY:   Past Medical History:  Diagnosis Date  . Allergy   . Cataract    BILATERAL-REMOVED 2 YEARS AGO  . GERD (gastroesophageal reflux disease)   . Hx of colonic polyp - ssp 11/03/2014  . Hypertension   . 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 Osteopenia 10/17/2011   DEXA 09/2007: -1.4 L fem; 10/2011: -1.2 L fem   . PONV (postoperative nausea and vomiting)   . Pseudogout of foot   . Rheumatoid arthritis(714.0) dx 2010     FAMILY HISTORY:   Family History  Problem Relation Age of Onset  . Stroke Mother   . Hypertension Father   . Heart attack Father     . Hypertension Sister        x 3  . Hypertension Brother   . Hyperthyroidism Sister        x2, s/p RAI ablation  . Hypothyroidism Brother     SOCIAL HISTORY:   Social History  Substance Use Topics  . Smoking status: Former Smoker    Types: Cigarettes  . Smokeless tobacco: Former Systems developer    Quit date: 08/19/1981  . Alcohol use No     ALLERGIES:   Allergies  Allergen Reactions  . Amlodipine Besylate     Tremors     CURRENT MEDICATIONS:   Current Outpatient Prescriptions  Medication Sig Dispense Refill  . aspirin 81 MG tablet Take 81 mg by mouth daily.    . B Complex-C (B-COMPLEX WITH VITAMIN C) tablet Take 2 tablets by mouth daily.    Marland Kitchen BIOTIN PO Take 1 tablet by mouth daily.     . Cyanocobalamin (VITAMIN B-12 PO) Take 1 tablet by mouth daily.     . furosemide (LASIX) 20 MG tablet Take 1 tablet (20 mg total) by mouth daily. 30 tablet 3  . gabapentin (NEURONTIN) 100 MG capsule Take 2 capsules (200 mg total) by mouth 2 (two) times daily. 360 capsule 1  . guaiFENesin (MUCINEX) 600 MG 12 hr  tablet Take 600 mg by mouth 2 (two) times daily as needed for cough or to loosen phlegm.     . hydroxychloroquine (PLAQUENIL) 200 MG tablet Take 200 mg by mouth daily.    Marland Kitchen losartan (COZAAR) 100 MG tablet Take 1 tablet (100 mg total) by mouth daily. 90 tablet 3  . metoprolol succinate (TOPROL-XL) 50 MG 24 hr tablet Take 1 tablet (50 mg total) by mouth daily. Take with or immediately following a meal. 90 tablet 3  . nortriptyline (PAMELOR) 10 MG capsule Nortriptyline 10mg  at bedtime for 2 week, then increase to 2 tablet at bedtime 60 capsule 5  . pyridOXINE (VITAMIN B-6) 100 MG tablet Take 100 mg by mouth daily.    . ranitidine (ZANTAC) 150 MG tablet Take 150 mg by mouth daily as needed for heartburn.     . Red Yeast Rice Extract (RED YEAST RICE PO) Take 2 tablets by mouth daily.    Marland Kitchen sulfaSALAzine (AZULFIDINE) 500 MG EC tablet TAKE 2 TABLETS BY MOUTH TWICE DAILY 120 tablet 0  . thiamine  (VITAMIN B-1) 100 MG tablet Take 100 mg by mouth daily.     Marland Kitchen tiZANidine (ZANAFLEX) 4 MG tablet Take 0.5-1 tablets (2-4 mg total) by mouth every 6 (six) hours as needed for muscle spasms. 60 tablet 1   No current facility-administered medications for this visit.     REVIEW OF SYSTEMS:   [X]  denotes positive finding, [ ]  denotes negative finding Cardiac  Comments:  Chest pain or chest pressure:    Shortness of breath upon exertion:    Short of breath when lying flat:    Irregular heart rhythm:        Vascular    Pain in calf, thigh, or hip brought on by ambulation:    Pain in feet at night that wakes you up from your sleep:  x   Blood clot in your veins:    Leg swelling:  x       Pulmonary    Oxygen at home:    Productive cough:     Wheezing:         Neurologic    Sudden weakness in arms or legs:     Sudden numbness in arms or legs:     Sudden onset of difficulty speaking or slurred speech:    Temporary loss of vision in one eye:     Problems with dizziness:         Gastrointestinal    Blood in stool:     Vomited blood:         Genitourinary    Burning when urinating:     Blood in urine:        Psychiatric    Major depression:         Hematologic    Bleeding problems:    Problems with blood clotting too easily:        Skin    Rashes or ulcers:        Constitutional    Fever or chills:      PHYSICAL EXAM:   Vitals:   02/03/17 1612 02/03/17 1617  BP: (!) 195/94 (!) 205/87  Pulse: 73   Resp: 20   Temp: 98 F (36.7 C)   TempSrc: Oral   SpO2: 96%   Weight: 236 lb (107 kg)   Height: 5\' 6"  (1.676 m)     GENERAL: The patient is a well-nourished female, in no acute distress. The vital signs are  documented above. CARDIAC: There is a regular rate and rhythm.  VASCULAR: Palpable posterior tibial pulses.  2+ pitting edema bilaterally. PULMONARY: Non-labored respirations MUSCULOSKELETAL: There are no major deformities or cyanosis. NEUROLOGIC: No focal  weakness or paresthesias are detected. SKIN: There are no ulcers or rashes noted. PSYCHIATRIC: The patient has a normal affect.  STUDIES:   Venous reflux study was performed today.  No significant reflux was seen in the bilateral great and small saphenous vein.  Incompetence was noted in the right common femoral vein.  No DVT was seen  MEDICAL ISSUES:   Bilateral leg swelling: The patient is suffering from lymphedema.  She does not have significant venous reflux.  She is unable to tolerate compression stockings.  I think she would be an excellent candidate for the lymphedema pump.  She was given a pamphlet today.  She will return in 3 months to get the paperwork completed.  I am also referring her to lymphedema clinic.  Peripheral neuropathy: This is not secondary to venous or arterial insufficiency.  She is scheduled to see neurology in the near future.    Annamarie Major, MD [facility] [phone] Pager [phone]

## 2017-02-06 DIAGNOSIS — H26491 Other secondary cataract, right eye: Secondary | ICD-10-CM | POA: Diagnosis not present

## 2017-02-06 DIAGNOSIS — M069 Rheumatoid arthritis, unspecified: Secondary | ICD-10-CM | POA: Diagnosis not present

## 2017-02-06 DIAGNOSIS — H26492 Other secondary cataract, left eye: Secondary | ICD-10-CM | POA: Diagnosis not present

## 2017-02-07 MED FILL — FUROSEMIDE 20 MG TABLET: 20 | 30 days supply | Qty: 30 | Fill #2

## 2017-02-13 ENCOUNTER — Other Ambulatory Visit: Payer: Self-pay | Admitting: Family Medicine

## 2017-02-13 ENCOUNTER — Telehealth: Payer: Self-pay | Admitting: *Deleted

## 2017-02-13 ENCOUNTER — Ambulatory Visit: Payer: Medicare Other

## 2017-02-13 MED ORDER — METOPROLOL SUCCINATE ER 50 MG PO TB24: 100.0000 mg | ORAL_TABLET | Freq: Every day | ORAL | 1 refills | Status: DC

## 2017-02-13 MED FILL — tiZANidine HCL 4 MG TABS: 4 | 15 days supply | Qty: 30 | Fill #0

## 2017-02-13 NOTE — Telephone Encounter (Signed)
I called [name] and informed her of the message below and she stated she spoke with the pt and she has been taking 2 once a day.  New Rx sent via escribe to [facility].

## 2017-02-13 NOTE — Telephone Encounter (Signed)
This is a long acting medication - usually taken once daily. Can send as 2 tabs once daily if she is take a total of 100mg  daily. If she feels better splitting to twice a day that is ok.

## 2017-02-13 NOTE — Telephone Encounter (Signed)
Kendall called from [facility] stating an Rx was sent for Metoprolol to take one a day and the pt told them she is taking 2 a day.  Please verify correct sigs and Delilah Shan can be reached at [phone].

## 2017-02-14 MED FILL — METOPROLOL SUCC ER 50 MG TA: 50 | 90 days supply | Qty: 180 | Fill #0

## 2017-02-17 DIAGNOSIS — I890 Lymphedema, not elsewhere classified: Secondary | ICD-10-CM | POA: Diagnosis not present

## 2017-02-20 MED FILL — sulfaSALAzine 500 MG TABS: 500 | 30 days supply | Qty: 120 | Fill #1

## 2017-02-21 DIAGNOSIS — H26492 Other secondary cataract, left eye: Secondary | ICD-10-CM | POA: Diagnosis not present

## 2017-03-10 MED FILL — LOSARTAN POTASSIUM 100 MG T: 100 | 90 days supply | Qty: 90 | Fill #1

## 2017-03-10 MED FILL — HYDROXYCHLOROQUINE 200 MG T: 200 | 30 days supply | Qty: 60 | Fill #1

## 2017-03-10 MED FILL — FUROSEMIDE 20 MG TAB: 20 | 30 days supply | Qty: 30 | Fill #3

## 2017-03-24 MED FILL — sulfaSALAzine 500 MG TABS: 500 | 30 days supply | Qty: 120 | Fill #0

## 2017-04-02 NOTE — Progress Notes (Signed)
HPI: [name] is a pleasant 66 y.o. here for follow up. Chronic medical problems summarized below were reviewed for changes.  This is limiting and sometimes the pain is so bad that she feels she needs a wheelchair parking decal for those times.Otherwise she tries to walk and get some exercise when able. She went to the lymphedema clinic, but this was not helpful at all as she reports they did not do any treatment, but rather simply handed her a sheet of exercises to do at home. She is not able to do some of these because of her back issues.  Denies CP, SOB, DOE, treatment intolerance or new symptoms.  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  Past Medical History:  Diagnosis Date  . Allergy   . Cataract    BILATERAL-REMOVED 2 YEARS AGO  . GERD (gastroesophageal reflux disease)   . Hx of colonic polyp - ssp 11/03/2014  . Hypertension   . 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 Osteopenia 10/17/2011   DEXA 09/2007: -1.4 L fem; 10/2011: -1.2 L fem   . PONV (postoperative nausea and vomiting)   . Pseudogout of foot   . Rheumatoid arthritis(714.0) dx 2010    Past Surgical History:  Procedure Laterality Date  . BREAST BIOPSY  1972  . Broken wrist  2010  . CATARACT EXTRACTION  03/2012   left  . COLONOSCOPY    . COSMETIC SURGERY    . FRACTURE SURGERY    . INNER EAR SURGERY     busted ear drum  .  MAXIMUM ACCESS ([name])POSTERIOR LUMBAR INTERBODY FUSION (PLIF) 1 LEVEL N/A 10/11/2016   Procedure: Lumbar one-Sacral one Maximum access posterior lumbar interbody fusion;  Surgeon: Erline Levine, MD;  Location: [facility];  Service: Neurosurgery;  Laterality: N/A;  . pneumonia  2007    Family History  Problem Relation Age of Onset  . Stroke Mother   . Hypertension Father   . Heart attack Father   . Hypertension Sister        x 3  . Hypertension Brother   . Hyperthyroidism Sister        x2, s/p RAI ablation  . Hypothyroidism Brother     Social History   Social History  . Marital status: Married    Spouse name: N/A  . Number of children: 3  . Years of education: N/A   Occupational History  . Retired    Social History Main Topics  . Smoking status: Former Smoker    Types: Cigarettes  . Smokeless tobacco: Former Systems developer    Quit date: 08/19/1981  . Alcohol use No  . Drug use: No  . Sexual activity: Not Asked   Other Topics Concern  . None   Social History Narrative   Artist -retired Building control surveyor   Married, lives with spouse, [name], he is IT support for [employer]   3 sons   2 caffeinated beverages a day   No regular exercise, diet  is ok     Current Outpatient Prescriptions:  .  aspirin 81 MG tablet, Take 81 mg by mouth daily., Disp: , Rfl:  .  B Complex-C (B-COMPLEX WITH VITAMIN C) tablet, Take 2 tablets by mouth daily., Disp: , Rfl:  .  BIOTIN PO, Take 1 tablet by mouth daily. , Disp: , Rfl:  .  Cyanocobalamin (VITAMIN B-12 PO), Take 1 tablet by mouth daily. , Disp: , Rfl:  .  furosemide (LASIX) 20 MG tablet, Take 1 tablet (20 mg total) by mouth daily., Disp: 30 tablet, Rfl: 3 .  gabapentin (NEURONTIN) 100 MG capsule, Take 2 capsules (200 mg total) by mouth 2 (two) times daily., Disp: 360 capsule, Rfl: 1 .  guaiFENesin (MUCINEX) 600 MG 12 hr tablet, Take 600 mg by mouth 2 (two) times daily as needed for cough or to loosen phlegm. , Disp: , Rfl:  .   hydroxychloroquine (PLAQUENIL) 200 MG tablet, Take 200 mg by mouth daily., Disp: , Rfl:  .  losartan (COZAAR) 100 MG tablet, Take 1 tablet (100 mg total) by mouth daily., Disp: 90 tablet, Rfl: 3 .  metoprolol succinate (TOPROL-XL) 50 MG 24 hr tablet, Take 2 tablets (100 mg total) by mouth daily. Take with or immediately following a meal., Disp: 180 tablet, Rfl: 1 .  nortriptyline (PAMELOR) 10 MG capsule, Nortriptyline 10mg  at bedtime for 2 week, then increase to 2 tablet at bedtime (Patient taking differently: as needed. Nortriptyline 10mg  at bedtime for 2 week, then increase to 2 tablet at bedtime), Disp: 60 capsule, Rfl: 5 .  pyridOXINE (VITAMIN B-6) 100 MG tablet, Take 100 mg by mouth daily., Disp: , Rfl:  .  ranitidine (ZANTAC) 150 MG tablet, Take 150 mg by mouth daily as needed for heartburn. , Disp: , Rfl:  .  Red Yeast Rice Extract (RED YEAST RICE PO), Take 2 tablets by mouth daily., Disp: , Rfl:  .  sulfaSALAzine (AZULFIDINE) 500 MG EC tablet, TAKE 2 TABLETS BY MOUTH TWICE DAILY, Disp: 120 tablet, Rfl: 0 .  thiamine (VITAMIN B-1) 100 MG tablet, Take 100 mg by mouth daily. , Disp: , Rfl:  .  tiZANidine (ZANAFLEX) 4 MG tablet, Take 0.5-1 tablets (2-4 mg total) by mouth every 6 (six) hours as needed for muscle spasms., Disp: 60 tablet, Rfl: 1  EXAM:  Vitals:   04/03/17 1102  BP: 120/80  Pulse: 67  Temp: 98.5 F (36.9 C)    Body mass index is 38.08 kg/m.  GENERAL: vitals reviewed and listed above, alert, oriented, appears well hydrated and in no acute distress  HEENT: atraumatic, conjunttiva clear, no obvious abnormalities on inspection of external nose and ears  NECK: no obvious masses on inspection  LUNGS: clear to auscultation bilaterally, no wheezes, rales or rhonchi, good air movement  CV: HRRR  MS: moves all extremities without noticeable abnormality  PSYCH: pleasant and cooperative, no obvious depression or anxiety  ASSESSMENT AND PLAN:  Discussed the following  assessment and plan:  Essential hypertension - Plan: Basic metabolic panel, CBC  Hyperlipidemia, unspecified hyperlipidemia type  Class 2 severe obesity due to excess calories with serious comorbidity and body mass index (BMI) of 37.0 to 37.9 in adult [facility])  Rheumatoid arthritis, involving unspecified site, unspecified rheumatoid factor presence (HCC)  Neuropathy  - blood pressure labs today, cholesterol follow-up and she does her wellness visit as will need fasting - advised to schedule wellness visit - lifestyle recommendations -  Wheelchair parking decal form completed for bad days when  feet are too sore to walk distances - discussed trying the lymphedema clinic again, however advise that she requests an actual treatment - she is also considering laser light therapy in some sort of injection treatment with a chiropractor's office,[facility], she plans to discuss with her neurologist first -  I am not familiar with these treatments for neuropathy -Patient advised to return or notify a doctor immediately if symptoms worsen or persist or new concerns arise.  Patient Instructions  BEFORE YOU LEAVE: -wheelchair parking decal form -labs -follow up: 1) AWV with Manuela Schwartz asap when convenient for pt and ensure is on a day that Dr. Maudie Mercury is in the office (not a Wednesday, etc) 2) follow up with Dr. Maudie Mercury in 3-4 months  We have ordered labs or studies at this visit. It can take up to 1-2 weeks for results and processing. IF results require follow up or explanation, we will call you with instructions. Clinically stable results will be released to your [facility]. If you have not heard from [place] or cannot find your results in [facility] in 2 weeks please contact our office at [phone].  If you are not yet signed up for [facility], please consider signing up.  Contact lymphedema clinic again and ask for actual treatment with therapist. Explain situation.  WE NOW OFFER   [facility] FAST  TRACK!!!  SAME DAY Appointments for ACUTE CARE  Such as: Sprains, Injuries, cuts, abrasions, rashes, muscle pain, joint pain, back pain Colds, flu, sore throats, headache, allergies, cough, fever  Ear pain, sinus and eye infections Abdominal pain, nausea, vomiting, diarrhea, upset stomach Animal/insect bites  3 Easy Ways to Schedule: Walk-In Scheduling Call in scheduling Mychart Sign-up: [email or web address]                  Colin Benton R., DO

## 2017-04-03 ENCOUNTER — Encounter: Payer: Self-pay | Admitting: Family Medicine

## 2017-04-03 ENCOUNTER — Ambulatory Visit (INDEPENDENT_AMBULATORY_CARE_PROVIDER_SITE_OTHER): Payer: 59 | Admitting: Family Medicine

## 2017-04-03 ENCOUNTER — Other Ambulatory Visit: Payer: Self-pay | Admitting: Family Medicine

## 2017-04-03 VITALS — BP 120/80 | HR 67 | Temp 98.5°F | Ht 66.0 in | Wt 235.9 lb

## 2017-04-03 DIAGNOSIS — G629 Polyneuropathy, unspecified: Secondary | ICD-10-CM

## 2017-04-03 DIAGNOSIS — I10 Essential (primary) hypertension: Secondary | ICD-10-CM

## 2017-04-03 DIAGNOSIS — E785 Hyperlipidemia, unspecified: Secondary | ICD-10-CM | POA: Diagnosis not present

## 2017-04-03 DIAGNOSIS — R899 Unspecified abnormal finding in specimens from other organs, systems and tissues: Secondary | ICD-10-CM

## 2017-04-03 DIAGNOSIS — M069 Rheumatoid arthritis, unspecified: Secondary | ICD-10-CM | POA: Diagnosis not present

## 2017-04-03 DIAGNOSIS — Z6837 Body mass index (BMI) 37.0-37.9, adult: Secondary | ICD-10-CM | POA: Diagnosis not present

## 2017-04-03 LAB — BASIC METABOLIC PANEL
BUN: 13 mg/dL (ref 6–23)
CHLORIDE: 101 meq/L (ref 96–112)
CO2: 32 mEq/L (ref 19–32)
CREATININE: 0.93 mg/dL (ref 0.40–1.20)
Calcium: 10.4 mg/dL (ref 8.4–10.5)
GFR: 63.67 mL/min (ref >60.00)
GLUCOSE: 109 mg/dL — AB (ref 70–99)
POTASSIUM: 4.4 meq/L (ref 3.5–5.1)
Sodium: 137 mEq/L (ref 135–145)

## 2017-04-03 LAB — CBC
HEMATOCRIT: 38 % (ref 36.0–46.0)
HEMOGLOBIN: 12.1 g/dL (ref 12.0–15.0)
MCHC: 31.9 g/dL (ref 30.0–36.0)
MCV: 77.4 fl — ABNORMAL LOW (ref 78.0–100.0)
Platelets: 266 10*3/uL (ref 150.0–400.0)
RBC: 4.9 Mil/uL (ref 3.87–5.11)
RDW: 15 % (ref 11.5–15.5)
WBC: 4.9 10*3/uL (ref 4.0–10.5)

## 2017-04-03 MED FILL — GABAPENTIN 100 MG CAPS: 100 | 90 days supply | Qty: 360 | Fill #0

## 2017-04-03 NOTE — Patient Instructions (Signed)
BEFORE YOU LEAVE: -wheelchair parking decal form -labs -follow up: 1) AWV with Manuela Schwartz asap when convenient for pt and ensure is on a day that Dr. Maudie Mercury is in the office (not a Wednesday, etc) 2) follow up with Dr. Maudie Mercury in 3-4 months  We have ordered labs or studies at this visit. It can take up to 1-2 weeks for results and processing. IF results require follow up or explanation, we will call you with instructions. Clinically stable results will be released to your [facility]. If you have not heard from [place] or cannot find your results in [facility] in 2 weeks please contact our office at [phone].  If you are not yet signed up for [facility], please consider signing up.  Contact lymphedema clinic again and ask for actual treatment with therapist. Explain situation.  WE NOW OFFER   [facility] FAST TRACK!!!  SAME DAY Appointments for ACUTE CARE  Such as: Sprains, Injuries, cuts, abrasions, rashes, muscle pain, joint pain, back pain Colds, flu, sore throats, headache, allergies, cough, fever  Ear pain, sinus and eye infections Abdominal pain, nausea, vomiting, diarrhea, upset stomach Animal/insect bites  3 Easy Ways to Schedule: Walk-In Scheduling Call in scheduling Mychart Sign-up: [email or web address]

## 2017-04-04 ENCOUNTER — Encounter: Payer: Self-pay | Admitting: Neurology

## 2017-04-04 ENCOUNTER — Ambulatory Visit (INDEPENDENT_AMBULATORY_CARE_PROVIDER_SITE_OTHER): Payer: 59 | Admitting: Neurology

## 2017-04-04 VITALS — BP 130/80 | HR 69 | Ht 66.0 in | Wt 236.1 lb

## 2017-04-04 DIAGNOSIS — G629 Polyneuropathy, unspecified: Secondary | ICD-10-CM | POA: Diagnosis not present

## 2017-04-04 NOTE — Patient Instructions (Addendum)
Take gabapentin 200mg  in the morning and increase to 300mg  at bedtime  You can try lidocaine ointment to feet  Return to clinic as needed

## 2017-04-04 NOTE — Progress Notes (Signed)
Follow-up Visit   Date: 04/04/17    [name] MRN: [id number] DOB: 06/15/1951   Interim History: [name] is a 66 y.o. right-handed Caucasian female with hypertension, GERD, rheumatoid arthritis, status post L5-S1 lumbar fusion and decompression returning to the clinic for follow-up of neuropathy.  The patient was accompanied to the clinic by self.   History of present illness: Starting around2013, she started redness and swelling of the toes and feet.  Later, she began having burning and firey sensation of the toes.  She also has numbness of the feet.  Over the past two years, her burning pain, swelling, and redness has progressively worsened and now involve both feet to the level of the ankles. Her toes can swell so much that it prohibits her from moving her toes at times.   She gets significant relief of symptoms with soaking her feet in ice water.  Activity and heat exacerbates her symptoms.  She has previously seen cardiology who did not feel symptoms were vascular in origin.  She went to [name] and started her on gabapentin 200mg  twice daily, which provides some relief.   She was unable to tolerate any higher dose due to sedation.  She did not tolerate Lyrica due to hair loss. She started vitamin B12 1021mcg and B1 for neuropathy after reading that this may help.   She was diagnosed with rheumatoid arthritis around the same time that her feet started to change color and cause pain.  She takes a daily aspirin, but does not appreciate that this improves her pain any.  She sees [name] for RA and takes plaquenil and sulfasalazine.   She has long history of low back and bilateral leg pain worse on the right which was exacerbated in 2017 due to a slip. Around that time, she started having right leg shooting pain radiating from her back to her knee. MRI lumbar spine from August 2017 showed narrowing at left L3-4, mild arthritis at L4-5 left>right, severe  arthritis at L5-S1 with L5 nerve impingement on the right.  Due to ongoing pain, she underwent decompression and fusion L5-S1 with significant improvement in back and leg pain  on 10/11/2016 by [name] and is very pleased with her results.  UPDATE 04/04/2017: She is here for follow-up of neuropathy.  Since her last visit, she has not noticed any change in the severity of her pain and she is able to function with her discomfort more.  She stopped nortriptyline because of tremors.  She is taking gabapentin 200mg  twice daily and is not having side effects, but continues to have firey pain in her feet.  She is being treated for lymphedema.  She has questions about infrared heat treatment and blood exchange and for neuropathy.  She had an initial consultation with a [name] and was told it would cost $4000.  Medications:  Current Outpatient Prescriptions on File Prior to Visit  Medication Sig Dispense Refill  . aspirin 81 MG tablet Take 81 mg by mouth daily.    . B Complex-C (B-COMPLEX WITH VITAMIN C) tablet Take 2 tablets by mouth daily.    Marland Kitchen BIOTIN PO Take 1 tablet by mouth daily.     . Cyanocobalamin (VITAMIN B-12 PO) Take 1 tablet by mouth daily.     . furosemide (LASIX) 20 MG tablet Take 1 tablet (20 mg total) by mouth daily. 30 tablet 3  . gabapentin (NEURONTIN) 100 MG capsule TAKE 2 CAPSULES BY MOUTH  TWICE DAILY 360 capsule 1  . guaiFENesin (MUCINEX) 600 MG 12 hr tablet Take 600 mg by mouth 2 (two) times daily as needed for cough or to loosen phlegm.     . hydroxychloroquine (PLAQUENIL) 200 MG tablet Take 200 mg by mouth daily.    Marland Kitchen losartan (COZAAR) 100 MG tablet Take 1 tablet (100 mg total) by mouth daily. 90 tablet 3  . metoprolol succinate (TOPROL-XL) 50 MG 24 hr tablet Take 2 tablets (100 mg total) by mouth daily. Take with or immediately following a meal. 180 tablet 1  . pyridOXINE (VITAMIN B-6) 100 MG tablet Take 100 mg by mouth daily.    . ranitidine (ZANTAC) 150 MG tablet Take  150 mg by mouth daily as needed for heartburn.     . Red Yeast Rice Extract (RED YEAST RICE PO) Take 2 tablets by mouth daily.    Marland Kitchen sulfaSALAzine (AZULFIDINE) 500 MG EC tablet TAKE 2 TABLETS BY MOUTH TWICE DAILY 120 tablet 0  . thiamine (VITAMIN B-1) 100 MG tablet Take 100 mg by mouth daily.     Marland Kitchen tiZANidine (ZANAFLEX) 4 MG tablet Take 0.5-1 tablets (2-4 mg total) by mouth every 6 (six) hours as needed for muscle spasms. 60 tablet 1   No current facility-administered medications on file prior to visit.     Allergies:  Allergies  Allergen Reactions  . Amlodipine Besylate     Tremors    Review of Systems:  CONSTITUTIONAL: No fevers, chills, night sweats, or weight loss.  EYES: No visual changes or eye pain ENT: No hearing changes.  No history of nose bleeds.   RESPIRATORY: No cough, wheezing and shortness of breath.   CARDIOVASCULAR: Negative for chest pain, and palpitations.   GI: Negative for abdominal discomfort, blood in stools or black stools.  No recent change in bowel habits.   GU:  No history of incontinence.   MUSCLOSKELETAL: +history of joint pain +swelling.  No myalgias.   SKIN: Negative for lesions, rash, and itching.   ENDOCRINE: Negative for cold or heat intolerance, polydipsia or goiter.   PSYCH:  No depression or anxiety symptoms.   NEURO: As Above.   Vital Signs:  BP 130/80   Pulse 69   Ht 5\' 6"  (1.676 m)   Wt 236 lb 2 oz (107.1 kg)   SpO2 96%   BMI 38.11 kg/m   Neurological Exam: MENTAL STATUS including orientation to time, place, person, recent and remote memory, attention span and concentration, language, and fund of knowledge is normal.  Speech is not dysarthric.  CRANIAL NERVES:  Face is symmetric.  MOTOR:  Motor strength is 5/5 in all extremities.  No pronator drift.  Tone is normal.    SENSORY:  Vibration is reduced distal to ankles bilaterally.  Temperature is intact.  COORDINATION/GAIT:   Gait narrow based and stable.   Data: MRI lumbar  spine 03/19/2016: 1. Severe facet arthrosis at L5-S1 with grade 1 anterolisthesis and moderate right lateral recess stenosis, potentially affecting the right S1 nerve. 2. Mild right and moderate left lateral recess stenosis at L4-5. 3. Moderate left lateral recess stenosis at L3-4.  EKG 10/10/2016:  QTc 442  LAbs 12/18/2016:  TSH 0.89, vitamin B12 405, copper 91, SPEP with IFE no M protein   NCS/EMG of the legs 01/02/2017:   This study was technically challenged and limited by lower extremity edema.  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  Clinical correlation recommended.  IMPRESSION/PLAN: Mrs. Gains is a 66 year old female returning for evaluation of neuropathy of the feet.  Because of swelling and hyperemia are not typical features of neuropathy, I had also raised the possibility of erythromelalgia, which can be associated with autoimmune conditions such as rheumatoid arthritis.  However, it was suggested that the color changes may be due more to lymphedema.  For her neuropathy, she is taking gabapentin 200mg  BID and I will increase her nighttime dose to 300mg  at bedtime for better control of pain.  She did not tolerate nortriptyline due to tremors.  She may also try lidocaine ointment to feet as needed.  I had a lengthy discussion regarding alternative forms of medicine and did not endorse that she pursue infrared heat treatment and blood exchange for neuropathy as these are not evidenced-based therapies.  Return to clinic as needed  The duration of this appointment visit was 35 minutes of face-to-face time with the patient.  Greater than 50% of this time was spent in counseling, explanation of diagnosis, planning of further management, and coordination of care.   Thank you for allowing me to participate in patient's care.  If I can answer any additional questions, I would be  pleased to do so.    Sincerely,    Donika K. Posey Pronto, DO

## 2017-04-07 LAB — PTH, INTACT AND CALCIUM
CALCIUM: 10.4 mg/dL (ref 8.6–10.4)
PTH: 66 pg/mL — AB (ref 14–64)

## 2017-04-08 ENCOUNTER — Telehealth: Payer: Self-pay | Admitting: Family Medicine

## 2017-04-08 NOTE — Addendum Note (Signed)
Addended by: Agnes Lawrence on: 04/08/2017 03:47 PM   Modules accepted: Orders

## 2017-04-08 NOTE — Telephone Encounter (Signed)
See results note. 

## 2017-04-08 NOTE — Telephone Encounter (Signed)
[name] pt returned your call °

## 2017-04-09 ENCOUNTER — Other Ambulatory Visit: Payer: Self-pay | Admitting: Family Medicine

## 2017-04-10 ENCOUNTER — Ambulatory Visit: Payer: Medicare Other

## 2017-04-10 MED FILL — FUROSEMIDE 20 MG TAB: 20 | 30 days supply | Qty: 30 | Fill #0

## 2017-04-17 DIAGNOSIS — R5382 Chronic fatigue, unspecified: Secondary | ICD-10-CM | POA: Diagnosis not present

## 2017-04-17 DIAGNOSIS — M5136 Other intervertebral disc degeneration, lumbar region: Secondary | ICD-10-CM | POA: Diagnosis not present

## 2017-04-17 DIAGNOSIS — Z6837 Body mass index (BMI) 37.0-37.9, adult: Secondary | ICD-10-CM | POA: Diagnosis not present

## 2017-04-17 DIAGNOSIS — M2550 Pain in unspecified joint: Secondary | ICD-10-CM | POA: Diagnosis not present

## 2017-04-17 DIAGNOSIS — I7381 Erythromelalgia: Secondary | ICD-10-CM | POA: Diagnosis not present

## 2017-04-17 DIAGNOSIS — E669 Obesity, unspecified: Secondary | ICD-10-CM | POA: Diagnosis not present

## 2017-04-17 DIAGNOSIS — M0609 Rheumatoid arthritis without rheumatoid factor, multiple sites: Secondary | ICD-10-CM | POA: Diagnosis not present

## 2017-04-17 DIAGNOSIS — M5441 Lumbago with sciatica, right side: Secondary | ICD-10-CM | POA: Diagnosis not present

## 2017-04-17 DIAGNOSIS — M7989 Other specified soft tissue disorders: Secondary | ICD-10-CM | POA: Diagnosis not present

## 2017-04-22 MED FILL — sulfaSALAzine 500 MG TABS: 500 | 30 days supply | Qty: 120 | Fill #1

## 2017-05-01 ENCOUNTER — Ambulatory Visit: Payer: Medicare Other

## 2017-05-05 ENCOUNTER — Other Ambulatory Visit (INDEPENDENT_AMBULATORY_CARE_PROVIDER_SITE_OTHER): Payer: 59

## 2017-05-05 ENCOUNTER — Ambulatory Visit (INDEPENDENT_AMBULATORY_CARE_PROVIDER_SITE_OTHER): Payer: 59 | Admitting: Family

## 2017-05-05 ENCOUNTER — Ambulatory Visit (INDEPENDENT_AMBULATORY_CARE_PROVIDER_SITE_OTHER): Payer: 59 | Admitting: Internal Medicine

## 2017-05-05 ENCOUNTER — Encounter: Payer: Self-pay | Admitting: Family

## 2017-05-05 ENCOUNTER — Encounter: Payer: Self-pay | Admitting: Internal Medicine

## 2017-05-05 VITALS — BP 138/90 | HR 74 | Ht 65.5 in | Wt 237.0 lb

## 2017-05-05 VITALS — BP 151/83 | HR 77 | Temp 97.8°F | Resp 20 | Ht 66.0 in | Wt 237.9 lb

## 2017-05-05 DIAGNOSIS — G5793 Unspecified mononeuropathy of bilateral lower limbs: Secondary | ICD-10-CM | POA: Diagnosis not present

## 2017-05-05 DIAGNOSIS — E559 Vitamin D deficiency, unspecified: Secondary | ICD-10-CM | POA: Diagnosis not present

## 2017-05-05 DIAGNOSIS — L539 Erythematous condition, unspecified: Secondary | ICD-10-CM

## 2017-05-05 DIAGNOSIS — R600 Localized edema: Secondary | ICD-10-CM

## 2017-05-05 DIAGNOSIS — E213 Hyperparathyroidism, unspecified: Secondary | ICD-10-CM | POA: Diagnosis not present

## 2017-05-05 LAB — VITAMIN D 25 HYDROXY (VIT D DEFICIENCY, FRACTURES): VITD: 14.17 ng/mL — AB (ref 30.00–100.00)

## 2017-05-05 NOTE — Progress Notes (Signed)
CC: Follow up edema and dependent rubor of feet  History of Present Illness  [name] is a 66 y.o. (Oct 21, 1950) female who states that she has been having pain in her feet for at least 2 years. Currently her right leg bothers her more than the left. She feels as if her feet are on fire. They're very painful. She also feels like her feet are going to explode. She states that her symptoms are improved with Motrin as well as placing them in ice. There are no aggravating factors. Her pain appears to be constant. She has tried Neurontin at high doses but could not tolerate it. Lower dose Neurontin has minimal effect.   She had shingles in 2015 or 2016, states since then her feet have had intermittent swelling and pain, not resolved with elevation, but she states the ruddy discoloration in her feet and toes do resolve with elevation.   She has undergone back surgery with Dr. Vertell Limber.  She still complains of pain in her feet as well as swelling.  She was unable to put on the compression stockings.  Dr. Trula Slade last evaluated pt on 02-03-17. At that time venous reflux study showed no significant reflux in the bilateral great and small saphenous vein.  Incompetence was noted in the right common femoral vein.  No DVT was seen. Dr. Stephens Shire assessment was that pt had lymphedema.  She did not have significant venous reflux.  She was unable to tolerate compression stockings.  Dr. Trula Slade indicated that she would be an excellent candidate for the lymphedema pump.  She was given a pamphlet that day.  She was to return in 3 months to get the paperwork completed. Dr. Trula Slade referred her to a lymphedema clinic. 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  She was scheduled to see neurology.  Dr. Deliah Boston, neurologist, evaluated pt on 04-05-17. Her assessment/plan indicated because of swelling and hyperemia are not typical features of neuropathy, Dr. Posey Pronto had  also raised the possibility of erythromelalgia, which can be associated with autoimmune conditions such as rheumatoid arthritis.  However, it was suggested that the color changes may be due more to lymphedema.  For her neuropathy, she is taking gabapentin 200mg  BID and Dr. Posey Pronto increased her nighttime dose to 300mg  at bedtime for better control of pain.  She did not tolerate nortriptyline due to tremors.  She may also try lidocaine ointment to feet as needed.  Dr. Posey Pronto had a lengthy discussion regarding alternative forms of medicine and did not endorse that she pursue infrared heat treatment and blood exchange for neuropathy as these are not evidenced-based therapies. Pt states she is scheduled to see Dr. Posey Pronto soon for follow up.   She was evaluated by Dr. Cruzita Lederer, endocrine, today for mild hypercalcemia and mildly elevated PTH. Dr. Cruzita Lederer indicated that she will check whether her hyperparathyroidism is primary (Familial hypercalcemic hypocalciuria or parathyroid adenoma) or secondary (to conditions like: vitamin D deficiency, calcium malabsorption, hypercalciuria, renal insufficiency, etc.). Dr. Cruzita Lederer discussed with pt that we first need to bring her vitamin D level to normal so we can further investigate the parathyroid status. In the setting of a low vitamin D, the parathyroid hormone can be elevated, which is not a pathologic finding. However, if the PTH is elevated in the setting of a normal vitamin D, we will further need to investigate her for primary or secondary hyperparathyroidism.  She saw a physical therapist with [facility], pt states she  was given written instructions re self lymph massage, but she did not receive any massage.   She did obtain the pneumatic hose which she cannot put on without help due to recent back surgery.    Past Medical History:  Diagnosis Date  . Allergy   . Cataract    BILATERAL-REMOVED 2 YEARS AGO  . GERD (gastroesophageal reflux disease)   . Hx of  colonic polyp - ssp 11/03/2014  . Hypertension   . 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 Osteopenia 10/17/2011   DEXA 09/2007: -1.4 L fem; 10/2011: -1.2 L fem   . PONV (postoperative nausea and vomiting)   . Pseudogout of foot   . Rheumatoid arthritis(714.0) dx 2010    Social History Social History  Substance Use Topics  . Smoking status: Former Smoker    Types: Cigarettes  . Smokeless tobacco: Former Systems developer    Quit date: 08/19/1981  . Alcohol use No    Family History Family History  Problem Relation Age of Onset  . Stroke Mother   . Hypertension Father   . Heart attack Father   . Hypertension Sister        x 3  . Hypertension Brother   . Hyperthyroidism Sister        x2, s/p RAI ablation  . Hypothyroidism Brother     Surgical History Past Surgical History:  Procedure Laterality Date  . BREAST BIOPSY  1972  . Broken wrist  2010  . CATARACT EXTRACTION  03/2012   left  . COLONOSCOPY    . COSMETIC SURGERY    . FRACTURE SURGERY    . INNER EAR SURGERY     busted ear drum  . MAXIMUM ACCESS (MAS)POSTERIOR LUMBAR INTERBODY FUSION (PLIF) 1 LEVEL N/A 10/11/2016   Procedure: Lumbar one-Sacral one Maximum access posterior lumbar interbody fusion;  Surgeon: Erline Levine, MD;  Location: [place];  Service: Neurosurgery;  Laterality: N/A;  . pneumonia  2007    Allergies  Allergen Reactions  . Amlodipine Besylate     Tremors    Current Outpatient Prescriptions  Medication Sig Dispense Refill  . aspirin 81 MG tablet Take 81 mg by mouth daily.    . B Complex-C (B-COMPLEX WITH VITAMIN C) tablet Take 2 tablets by mouth daily.    . Cyanocobalamin (VITAMIN B-12 PO) Take 1 tablet by mouth daily.     . furosemide (LASIX) 20 MG tablet TAKE 1 TABLET BY MOUTH ONCE DAILY 30 tablet 5  . gabapentin (NEURONTIN) 100 MG capsule TAKE 2 CAPSULES BY MOUTH TWICE DAILY 360 capsule 1  . guaiFENesin (MUCINEX) 600 MG 12 hr tablet Take 600 mg by mouth 2 (two) times daily as needed for cough or to loosen  phlegm.     . hydroxychloroquine (PLAQUENIL) 200 MG tablet Take 200 mg by mouth daily. Patient is currently taking    . losartan (COZAAR) 100 MG tablet Take 1 tablet (100 mg total) by mouth daily. 90 tablet 3  . metoprolol succinate (TOPROL-XL) 50 MG 24 hr tablet Take 2 tablets (100 mg total) by mouth daily. Take with or immediately following a meal. 180 tablet 1  . pyridOXINE (VITAMIN B-6) 100 MG tablet Take 100 mg by mouth daily.    . ranitidine (ZANTAC) 150 MG tablet Take 150 mg by mouth daily as needed for heartburn.     . sulfaSALAzine (AZULFIDINE) 500 MG EC tablet TAKE 2 TABLETS BY MOUTH TWICE DAILY 120 tablet 0  . thiamine (VITAMIN B-1) 100 MG tablet  Take 100 mg by mouth daily.     Marland Kitchen tiZANidine (ZANAFLEX) 4 MG tablet Take 0.5-1 tablets (2-4 mg total) by mouth every 6 (six) hours as needed for muscle spasms. 60 tablet 1   No current facility-administered medications for this visit.     REVIEW OF SYSTEMS: see HPI for pertinent positives and negatives   Physical Examination  Vitals:   05/05/17 1513  BP: (!) 151/83  Pulse: 77  Resp: 20  Temp: 97.8 F (36.6 C)  TempSrc: Oral  SpO2: 95%  Weight: 237 lb 14.4 oz (107.9 kg)  Height: 5\' 6"  (1.676 m)   Body mass index is 38.4 kg/m.  General: The obese female appears her stated age.   HEENT:  No gross abnormalities Pulmonary: Respirations are non-labored, BBS are CTAB Abdomen: Soft and non-tender with normal pitched bowel sounds.  Musculoskeletal: There are no major deformities.   Neurologic: No focal weakness. No sensation at the tips of all toes, both feet.  Skin: There are no ulcer or rashes noted. Dependent rubor in both feet that mostly resolves with a couple of minutes elevation above her heart. No edema in her legs. 1-2+ non pitting edema in both feet.  Psychiatric: The patient has normal affect. Cardiovascular: There is a regular rate and rhythm without significant murmur appreciated.    Vascular: Vessel Right Left    Radial 2+Palpable 2+Palpable  Carotid  without bruit  without bruit  Aorta Not palpable N/A  Femoral Palpable Palpable  Popliteal Not palpable Not palpable  PT Palpable Palpable  DP Palpable Palpable      Medical Decision Making  [name] is a 66 y.o. female who presents with: 2-3 year history of worsening painful neuropathy in her feet since she had shingles on the right side of her mid back.  She does not seem to have edema in her legs at this time. She does have 1-2+ non pitting edema with dependent rubor in her feet. Bilateral pedal pulses are palpable. All toes of both feet lack sensation to touch. The dorsal aspects of both feet are painful with walking.  The pneumatic compression device seems of limited utility since she has no edema in her legs, just her feet.  Continued evaluation by endocrine, neurology, and possibly rheumatology to continue to search for etiology of her sx's, several symptom relief options have been tried. Placing her feet in ice water seems to alleviate her sx's best.   Dr. Trula Slade spoke with pt.    Follow up with [place] as needed.   Thank you for allowing [place] to participate in this patient's care.  Daryn Hicks, Sharmon Leyden, RN, MSN, FNP-C [facility] Office: [phone]  05/05/2017, 3:33 PM  Clinic MD: Trula Slade

## 2017-05-05 NOTE — Patient Instructions (Signed)
Please stop at [place] office.  I will let you know about the need for further labs once the results is back.  Please come back for a follow-up appointment in 6 months.

## 2017-05-05 NOTE — Progress Notes (Signed)
Patient ID: [name], female   DOB: [birth year], 66 y.o.   MRN: [id number]    HPI  [name] is a 66 y.o.-year-old female, referred by her PCP, Dr. Maudie Mercury, for evaluation for hypercalcemia/hyperparathyroidism. She is the wife of Demarie Uhlig.  Pt has had hypercalcemia at least since 2014. I reviewed pt's pertinent labs: Lab Results  Component Value Date   PTH 66 (H) 04/03/2017   CALCIUM 10.4 04/03/2017   CALCIUM 10.4 04/03/2017   CALCIUM 10.0 12/26/2016   CALCIUM 10.7 (H) 10/10/2016   CALCIUM 10.6 (H) 09/12/2016   CALCIUM 10.6 (H) 06/27/2016   CALCIUM 10.8 (H) 05/31/2016   CALCIUM 10.4 06/05/2015   CALCIUM 10.6 (H) 05/14/2013   CALCIUM 9.8 03/23/2010   She is on Biotin - in B complex  - taken for PN, erythromelalgia.  I reviewed pt's DEXA scans: Date L1-L4 T score FN T score  10/23/2011 ([place]) -1.0 LFN: -1.2 RFN: -1.0  10/05/2007 -1.3 (-15.4%*) LFN: -1.4   + fractures or falls: L wrist fx in 2005 (distal radius and small ulnar styloid avulsion fx).  No h/o kidney stones.  No h/o CKD. Last BUN/Cr: Lab Results  Component Value Date   BUN 13 04/03/2017   BUN 12 12/26/2016   CREATININE 0.93 04/03/2017   CREATININE 0.85 12/26/2016   Pt is not on HCTZ. She was on this until switched to Lasix 2-3 mo ago.  No h/o vitamin D deficiency. Reviewed vit D levels: Lab Results  Component Value Date   VD25OH 51 05/14/2013   Pt stopped calcium and also vitamin D - as advised by PCP 1 year ago.  Pt does not have a FH of hypercalcemia, pituitary tumors, thyroid cancer, or osteoporosis. She has FH of thyroid ds in 2 sisters and 1 brother. Older son and little sister had kidney stones.  Pt. also has a history of RA - on Plaquenyl.  Has HL - on Red Yeast Rice.  ROS: Constitutional: + weight gain/no weight loss, no fatigue, no subjective hyperthermia, no subjective hypothermia, + poor sleep Eyes: no blurry vision, no xerophthalmia ENT: no sore throat, + L cervical nodule  palpated in throat, no dysphagia, no odynophagia, no hoarseness Cardiovascular: no CP/no SOB/no palpitations/+ leg swelling Respiratory: no cough/no SOB/no wheezing Gastrointestinal: no N/no V/no D/+ C/+ acid reflux Musculoskeletal: + muscle aches/+ joint aches Skin: + rash - lower legs, + hair loss Neurological: + tremors/no numbness/no tingling/no dizziness  Past Medical History:  Diagnosis Date  . Allergy   . Cataract    BILATERAL-REMOVED 2 YEARS AGO  . GERD (gastroesophageal reflux disease)   . Hx of colonic polyp - ssp 11/03/2014  . Hypertension   . 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 Osteopenia 10/17/2011   DEXA 09/2007: -1.4 L fem; 10/2011: -1.2 L fem   . PONV (postoperative nausea and vomiting)   . Pseudogout of foot   . Rheumatoid arthritis(714.0) dx 2010   Past Surgical History:  Procedure Laterality Date  . BREAST BIOPSY  1972  . Broken wrist  2010  . CATARACT EXTRACTION  03/2012   left  . COLONOSCOPY    . COSMETIC SURGERY    . FRACTURE SURGERY    . INNER EAR SURGERY     busted ear drum  . MAXIMUM ACCESS (MAS)POSTERIOR LUMBAR INTERBODY FUSION (PLIF) 1 LEVEL N/A 10/11/2016   Procedure: Lumbar one-Sacral one Maximum access posterior lumbar interbody fusion;  Surgeon: Erline Levine, MD;  Location: [place];  Service: Neurosurgery;  Laterality: N/A;  . pneumonia  2007   Social History   Social History  . Marital status: Married    Spouse name: N/A  . Number of children: 3  . Years of education: N/A   Occupational History  . Retired    Social History Main Topics  . Smoking status: Former Smoker    Types: Cigarettes  . Smokeless tobacco: Former Systems developer    Quit date: 08/19/1981  . Alcohol use No  . Drug use: No   Social History Narrative   Artist -retired Building control surveyor   Married, lives with spouse, [name], he is IT support for [employer]   3 sons   2 caffeinated beverages a day   No regular exercise, diet is ok   Current Outpatient Prescriptions  on File Prior to Visit  Medication Sig Dispense Refill  . aspirin 81 MG tablet Take 81 mg by mouth daily.    . B Complex-C (B-COMPLEX WITH VITAMIN C) tablet Take 2 tablets by mouth daily.    . Cyanocobalamin (VITAMIN B-12 PO) Take 1 tablet by mouth daily.     . furosemide (LASIX) 20 MG tablet TAKE 1 TABLET BY MOUTH ONCE DAILY 30 tablet 5  . gabapentin (NEURONTIN) 100 MG capsule TAKE 2 CAPSULES BY MOUTH TWICE DAILY 360 capsule 1  . guaiFENesin (MUCINEX) 600 MG 12 hr tablet Take 600 mg by mouth 2 (two) times daily as needed for cough or to loosen phlegm.     . hydroxychloroquine (PLAQUENIL) 200 MG tablet Take 200 mg by mouth daily.    Marland Kitchen losartan (COZAAR) 100 MG tablet Take 1 tablet (100 mg total) by mouth daily. 90 tablet 3  . metoprolol succinate (TOPROL-XL) 50 MG 24 hr tablet Take 2 tablets (100 mg total) by mouth daily. Take with or immediately following a meal. 180 tablet 1  . pyridOXINE (VITAMIN B-6) 100 MG tablet Take 100 mg by mouth daily.    . ranitidine (ZANTAC) 150 MG tablet Take 150 mg by mouth daily as needed for heartburn.     . sulfaSALAzine (AZULFIDINE) 500 MG EC tablet TAKE 2 TABLETS BY MOUTH TWICE DAILY 120 tablet 0  . thiamine (VITAMIN B-1) 100 MG tablet Take 100 mg by mouth daily.     Marland Kitchen tiZANidine (ZANAFLEX) 4 MG tablet Take 0.5-1 tablets (2-4 mg total) by mouth every 6 (six) hours as needed for muscle spasms. 60 tablet 1  . Red Yeast Rice Extract (RED YEAST RICE PO) Take 2 tablets by mouth daily.     No current facility-administered medications on file prior to visit.    Allergies  Allergen Reactions  . Amlodipine Besylate     Tremors   Family History  Problem Relation Age of Onset  . Stroke Mother   . Hypertension Father   . Heart attack Father   . Hypertension Sister        x 3  . Hypertension Brother   . Hyperthyroidism Sister        x2, s/p RAI ablation  . Hypothyroidism Brother     PE: BP 138/90 (BP Location: Left Arm, Patient Position: Sitting)   Pulse  74   Ht 5' 5.5" (1.664 m)   Wt 237 lb (107.5 kg)   SpO2 93%   BMI 38.84 kg/m  Wt Readings from Last 3 Encounters:  05/05/17 237 lb (107.5 kg)  04/04/17 236 lb 2 oz (107.1 kg)  04/03/17 235 lb 14.4 oz (107 kg)   Constitutional: obese,  in NAD. No kyphosis. Eyes: PERRLA, EOMI, no exophthalmos ENT: moist mucous membranes, no thyromegaly, + L upper cervical lymphadenopathy Cardiovascular: RRR, No MRG, periankle swelling B Respiratory: CTA B Gastrointestinal: abdomen soft, NT, ND, BS+ Musculoskeletal: no deformities, strength intact in all 4 Skin: moist, warm, + erythema and swelling B LE Neurological: + tremor with outstretched hands, DTR normal in all 4  Assessment: 1. Hypercalcemia/hyperparathyroidism  Plan: Patient has had several instances of elevated calcium, with the highest level being at 10.8, but has normalized after stopping calcium supplement and stopping HCTZ. An intact PTH level was, however, high, at 66 last month (corresponding calcium at ULN, 10.4). - unclear vitamin D status - the last level was normal, but this was in 2014 - No apparent complications from hypercalcemia: no h/o nephrolithiasis, no osteoporosis (had 1 fracture in 2005). No abdominal pain, depression, bone pain. - I discussed with the patient about the physiology of calcium and parathyroid hormone, and possible side effects from increased PTH, including kidney stones, osteoporosis, abdominal pain, etc.  - We discussed that we need to check whether her hyperparathyroidism is primary (Familial hypercalcemic hypocalciuria or parathyroid adenoma) or secondary (to conditions like: vitamin D deficiency, calcium malabsorption, hypercalciuria, renal insufficiency, etc.). - I discussed with her that we first need to bring her vitamin D level to normal so we can further investigate the parathyroid status. I explained that in the setting of a low vitamin D, the parathyroid hormone can be elevated, which is not a  pathologic finding. However, if the PTH is elevated in the setting of a normal vitamin D, we will further need to investigate her for primary or secondary hyperparathyroidism. - after we normalize the vitamin D level, we'll need to check (while off Biotin for at least 2 days): calcium level intact PTH ([facility]) Magnesium Phosphorus vitamin D- 25 HO and 1,25 HO 24h urinary calcium/creatinine ratio - given instructions for urine collection - We discussed possible consequences of hyperparathyroidism: ~1/3 pts will develop complications over 15 years (OP, nephrolithiasis).  - If the tests indicate a parathyroid adenoma, she agrees with a referral to surgery.  - criteria for parathyroid surgery:  Increased calcium by more than 1 mg/dL above the upper limit of normal  Kidney ds.  Osteoporosis (or Vb fx) Age <66 years old New (2013): High UCa >400 mg/d and increased stone risk by biochemical stone risk analysis Presence of nephrolithiasis or nephrocalcinosis Pt's preference!  - We may need to check a new DEXA scan to see if she has osteoporosis (+ add a 33% distal radius for evaluation of cortical bone, which is predominantly affected by hyperparathyroidism).  - I will advise pt. about vitamin D supplement dose when the results of the vitamin D level are back. - I will see the patient back in 6 months  Component     Latest Ref Rng & Units 05/05/2017  VITD     30.00 - 100.00 ng/mL 14.17 (L)  Very low vitamin D >> will start 5000 vitamin D daily and recheck vitamin D in 2 mo.  Philemon Kingdom, MD PhD [facility]

## 2017-05-05 NOTE — Patient Instructions (Addendum)
  To decrease swelling in your feet: Elevate feet above slightly bent knees, feet above heart, overnight and 3-4 times per day for 20 minutes.

## 2017-05-06 ENCOUNTER — Telehealth: Payer: Self-pay

## 2017-05-06 ENCOUNTER — Telehealth: Payer: Self-pay | Admitting: Internal Medicine

## 2017-05-06 DIAGNOSIS — E213 Hyperparathyroidism, unspecified: Secondary | ICD-10-CM | POA: Insufficient documentation

## 2017-05-06 NOTE — Telephone Encounter (Signed)
Patient called and advised of MD note.

## 2017-05-06 NOTE — Telephone Encounter (Signed)
Please advise. Thank you

## 2017-05-06 NOTE — Telephone Encounter (Signed)
Patient was advised of message, is it okay to restart her vitamin b medications?

## 2017-05-06 NOTE — Telephone Encounter (Signed)
Patient called in reference to Vit D. Patient was told she needs to take 5,000 units daily. Patient wanted to clarify. Patient also wanted to know if her vit D was the issue then does she need to come back and do more blood work. Please call patient and advise. OK to leave message.

## 2017-05-06 NOTE — Telephone Encounter (Signed)
Yes, she needs to start 5000 units daily over-the-counter vitamin D. As per my message to her, we need to repeat a vitamin D in 2 months. If this is normal, we'll need to investigate the rest of her labs and at that time we'll find out whether the high parathyroid hormone was related to the low vitamin D or not.

## 2017-05-06 NOTE — Telephone Encounter (Signed)
Yes, that would be OK

## 2017-05-06 NOTE — Telephone Encounter (Signed)
Routing to you °

## 2017-05-07 ENCOUNTER — Telehealth: Payer: Self-pay

## 2017-05-07 MED FILL — FUROSEMIDE 20 MG TAB: 20 | 30 days supply | Qty: 30 | Fill #1

## 2017-05-07 MED FILL — HYDROXYCHLOROQUINE 200 MG T: 200 | 30 days supply | Qty: 60 | Fill #2

## 2017-05-07 NOTE — Telephone Encounter (Signed)
Called patient and advised okay to restart vitamin b medications.

## 2017-05-08 ENCOUNTER — Encounter: Payer: Self-pay | Admitting: Family Medicine

## 2017-05-15 MED FILL — METOPROLOL SUCC ER 50 MG TA: 50 | 90 days supply | Qty: 180 | Fill #1

## 2017-05-20 MED FILL — sulfaSALAzine 500 MG TABS: 500 | 30 days supply | Qty: 120 | Fill #0

## 2017-05-22 DIAGNOSIS — H6122 Impacted cerumen, left ear: Secondary | ICD-10-CM | POA: Diagnosis not present

## 2017-05-22 DIAGNOSIS — J320 Chronic maxillary sinusitis: Secondary | ICD-10-CM | POA: Diagnosis not present

## 2017-05-22 DIAGNOSIS — J040 Acute laryngitis: Secondary | ICD-10-CM | POA: Diagnosis not present

## 2017-05-22 DIAGNOSIS — J322 Chronic ethmoidal sinusitis: Secondary | ICD-10-CM | POA: Diagnosis not present

## 2017-05-22 MED FILL — AZITHROMYCIN 500 MG TABLET: 500 | 3 days supply | Qty: 3 | Fill #0

## 2017-05-26 DIAGNOSIS — M5126 Other intervertebral disc displacement, lumbar region: Secondary | ICD-10-CM | POA: Diagnosis not present

## 2017-05-26 DIAGNOSIS — M545 Low back pain: Secondary | ICD-10-CM | POA: Diagnosis not present

## 2017-05-26 DIAGNOSIS — M5416 Radiculopathy, lumbar region: Secondary | ICD-10-CM | POA: Diagnosis not present

## 2017-05-30 ENCOUNTER — Ambulatory Visit: Payer: Medicare Other | Admitting: Internal Medicine

## 2017-06-05 DIAGNOSIS — J040 Acute laryngitis: Secondary | ICD-10-CM | POA: Diagnosis not present

## 2017-06-05 DIAGNOSIS — J301 Allergic rhinitis due to pollen: Secondary | ICD-10-CM | POA: Diagnosis not present

## 2017-06-05 DIAGNOSIS — J320 Chronic maxillary sinusitis: Secondary | ICD-10-CM | POA: Diagnosis not present

## 2017-06-05 DIAGNOSIS — J322 Chronic ethmoidal sinusitis: Secondary | ICD-10-CM | POA: Diagnosis not present

## 2017-06-10 MED FILL — FUROSEMIDE 20 MG TABLET: 20 | 30 days supply | Qty: 30 | Fill #2

## 2017-06-10 MED FILL — LOSARTAN POTASSIUM 100 MG T: 100 | 90 days supply | Qty: 90 | Fill #2

## 2017-06-12 NOTE — Progress Notes (Signed)
Follow-up Visit   Date: 06/13/17    BELISSA KOOY MRN: [id number] DOB: Feb 04, 1951   Interim History: FRANCENE MCERLEAN is a 66 y.o. right-handed Caucasian female with hypertension, GERD, rheumatoid arthritis, status post L5-S1 lumbar fusion and decompression returning to the clinic for follow-up of neuropathy.  The patient was accompanied to the clinic by self.   History of present illness: Starting around2013, she started redness and swelling of the toes and feet.  Later, she began having burning and firey sensation of the toes.  She also has numbness of the feet.  Over the past two years, her burning pain, swelling, and redness has progressively worsened and now involve both feet to the level of the ankles. Her toes can swell so much that it prohibits her from moving her toes at times.   She gets significant relief of symptoms with soaking her feet in ice water.  Activity and heat exacerbates her symptoms.  She has previously seen cardiology who did not feel symptoms were vascular in origin.  She went to [facility] and started her on gabapentin 200mg  twice daily, which provides some relief.   She was unable to tolerate any higher dose due to sedation.  She did not tolerate Lyrica due to hair loss. She started vitamin B12 1035mcg and B1 for neuropathy after reading that this may help.   She was diagnosed with rheumatoid arthritis around the same time that her feet started to change color and cause pain.  She takes a daily aspirin, but does not appreciate that this improves her pain any.  She sees Dr. Amil Amen for RA and takes plaquenil and sulfasalazine.   She has long history of low back and bilateral leg pain worse on the right which was exacerbated in 2017 due to a slip. Around that time, she started having right leg shooting pain radiating from her back to her knee. MRI lumbar spine from August 2017 showed narrowing at left L3-4, mild arthritis at L4-5 left>right, severe  arthritis at L5-S1 with L5 nerve impingement on the right.  Due to ongoing pain, she underwent decompression and fusion L5-S1 with significant improvement in back and leg pain  on 10/11/2016 by Dr. Vertell Limber and is very pleased with her results.  UPDATE 04/04/2017: She is here for follow-up of neuropathy.  Since her last visit, she has not noticed any change in the severity of her pain and she is able to function with her discomfort more.  She stopped nortriptyline because of tremors.  She is taking gabapentin 200mg  twice daily and is not having side effects, but continues to have firey pain in her feet.  She is being treated for lymphedema.  She has questions about infrared heat treatment and blood exchange and for neuropathy.  She had an initial consultation with a wellness center and was told it would cost $4000.  UPDATE 06/13/2017:  She scheduled sooner follow-up visit because of increased neuropathic pain of the feet.  She takes gabapentin 200mg  twice daily.  Last night, she tried taking 300 mg at bedtime, which helped alleviate her pain and denies having any adverse cognitive side effects.  The swelling and redness of her feet remains unchanged. She denies any falls or hospitalizations.    Medications:  Current Outpatient Prescriptions on File Prior to Visit  Medication Sig Dispense Refill  . aspirin 81 MG tablet Take 81 mg by mouth daily.    . B Complex-C (B-COMPLEX WITH VITAMIN C) tablet Take 2  tablets by mouth daily.    . Cyanocobalamin (VITAMIN B-12 PO) Take 1 tablet by mouth daily.     . furosemide (LASIX) 20 MG tablet TAKE 1 TABLET BY MOUTH ONCE DAILY 30 tablet 5  . gabapentin (NEURONTIN) 100 MG capsule TAKE 2 CAPSULES BY MOUTH TWICE DAILY 360 capsule 1  . guaiFENesin (MUCINEX) 600 MG 12 hr tablet Take 600 mg by mouth 2 (two) times daily as needed for cough or to loosen phlegm.     . hydroxychloroquine (PLAQUENIL) 200 MG tablet Take 200 mg by mouth daily. Patient is currently taking    .  losartan (COZAAR) 100 MG tablet Take 1 tablet (100 mg total) by mouth daily. 90 tablet 3  . metoprolol succinate (TOPROL-XL) 50 MG 24 hr tablet Take 2 tablets (100 mg total) by mouth daily. Take with or immediately following a meal. 180 tablet 1  . pyridOXINE (VITAMIN B-6) 100 MG tablet Take 100 mg by mouth daily.    . ranitidine (ZANTAC) 150 MG tablet Take 150 mg by mouth daily as needed for heartburn.     . sulfaSALAzine (AZULFIDINE) 500 MG EC tablet TAKE 2 TABLETS BY MOUTH TWICE DAILY 120 tablet 0  . thiamine (VITAMIN B-1) 100 MG tablet Take 100 mg by mouth daily.     Marland Kitchen tiZANidine (ZANAFLEX) 4 MG tablet Take 0.5-1 tablets (2-4 mg total) by mouth every 6 (six) hours as needed for muscle spasms. 60 tablet 1   No current facility-administered medications on file prior to visit.     Allergies:  Allergies  Allergen Reactions  . Amlodipine Besylate     Tremors  . Nortriptyline Other (See Comments)    tremors    Review of Systems:  CONSTITUTIONAL: No fevers, chills, night sweats, or weight loss.  EYES: No visual changes or eye pain ENT: No hearing changes.  No history of nose bleeds.   RESPIRATORY: No cough, wheezing and shortness of breath.   CARDIOVASCULAR: Negative for chest pain, and palpitations.   GI: Negative for abdominal discomfort, blood in stools or black stools.  No recent change in bowel habits.   GU:  No history of incontinence.   MUSCLOSKELETAL: +history of joint pain +swelling.  No myalgias.   SKIN: Negative for lesions, rash, and itching.   ENDOCRINE: Negative for cold or heat intolerance, polydipsia or goiter.   PSYCH:  No depression or anxiety symptoms.   NEURO: As Above.   Vital Signs:  BP (!) 160/90   Pulse 70   Ht 5\' 6"  (1.676 m)   Wt 241 lb (109.3 kg)   SpO2 97%   BMI 38.90 kg/m   Neurological Exam: MENTAL STATUS including orientation to time, place, person, recent and remote memory, attention span and concentration, language, and fund of knowledge is  normal.  Speech is not dysarthric.  CRANIAL NERVES:  Face is symmetric.  MOTOR:  Motor strength is 5/5 in all extremities. Tone is normal.    SENSORY:  Vibration is reduced distal to ankles bilaterally.  Temperature is intact.  COORDINATION/GAIT:   Gait narrow based and stable.   Data: MRI lumbar spine 03/19/2016: 1. Severe facet arthrosis at L5-S1 with grade 1 anterolisthesis and moderate right lateral recess stenosis, potentially affecting the right S1 nerve. 2. Mild right and moderate left lateral recess stenosis at L4-5. 3. Moderate left lateral recess stenosis at L3-4.  EKG 10/10/2016:  QTc 442  LAbs 12/18/2016:  TSH 0.89, vitamin B12 405, copper 91, SPEP with IFE no M  protein   NCS/EMG of the legs 01/02/2017:   This study was technically challenged and limited by lower extremity edema.  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  Clinical correlation recommended.  IMPRESSION/PLAN: Mrs. Casco is a 66 year old female returning for evaluation of neuropathy of the feet.  Because of swelling and hyperemia are not typical features of neuropathy, I had also raised the possibility of erythromelalgia, which can be associated with autoimmune conditions such as rheumatoid arthritis.  However, it was suggested that the color changes may be due more to lymphedema.   For her neuropathy, recommend that she increase gabapentin to 300mg  at bedtime, continue 200mg  in the morning, and add 100-200mg  in the afternoon.  Previously tried:  Lyrica (swelling), nortriptyline (tremors).  Consider Cymbalta or venlafaxine going forward.  She has mild benefit with OTC lidocaine ointment, which will be continued.  Return to clinic 3 months  Greater than 50% of this 25 minute visit was spent in counseling, explanation of diagnosis, planning of further management, and coordination of  care.    Thank you for allowing me to participate in patient's care.  If I can answer any additional questions, I would be pleased to do so.    Sincerely,    Donika K. Posey Pronto, DO

## 2017-06-13 ENCOUNTER — Ambulatory Visit (INDEPENDENT_AMBULATORY_CARE_PROVIDER_SITE_OTHER): Payer: 59 | Admitting: Neurology

## 2017-06-13 ENCOUNTER — Encounter: Payer: Self-pay | Admitting: Neurology

## 2017-06-13 VITALS — BP 160/90 | HR 70 | Ht 66.0 in | Wt 241.0 lb

## 2017-06-13 DIAGNOSIS — I7381 Erythromelalgia: Secondary | ICD-10-CM

## 2017-06-13 DIAGNOSIS — G629 Polyneuropathy, unspecified: Secondary | ICD-10-CM | POA: Diagnosis not present

## 2017-06-13 MED ORDER — GABAPENTIN 300 MG PO CAPS: 300.0000 mg | ORAL_CAPSULE | Freq: Every day | ORAL | 3 refills | Status: DC

## 2017-06-13 NOTE — Patient Instructions (Addendum)
Take gabapentin as follows:     AM  Afternoon  PM Week 1 200mg   100mg    300mg  Week 2 200mg   200mg    300mg   Return to clinic in January

## 2017-06-17 MED FILL — GABAPENTIN 300 MG CAPSULE: 300 | 90 days supply | Qty: 90 | Fill #0

## 2017-06-20 MED FILL — sulfaSALAzine 500 MG TABS: 500 | 30 days supply | Qty: 120 | Fill #1

## 2017-07-04 ENCOUNTER — Ambulatory Visit (INDEPENDENT_AMBULATORY_CARE_PROVIDER_SITE_OTHER): Payer: 59 | Admitting: Family Medicine

## 2017-07-04 ENCOUNTER — Encounter: Payer: Self-pay | Admitting: Family Medicine

## 2017-07-04 VITALS — BP 138/80 | HR 67 | Temp 98.3°F | Ht 66.0 in | Wt 236.0 lb

## 2017-07-04 DIAGNOSIS — E785 Hyperlipidemia, unspecified: Secondary | ICD-10-CM

## 2017-07-04 DIAGNOSIS — E213 Hyperparathyroidism, unspecified: Secondary | ICD-10-CM

## 2017-07-04 DIAGNOSIS — I10 Essential (primary) hypertension: Secondary | ICD-10-CM

## 2017-07-04 DIAGNOSIS — Z23 Encounter for immunization: Secondary | ICD-10-CM | POA: Diagnosis not present

## 2017-07-04 DIAGNOSIS — R591 Generalized enlarged lymph nodes: Secondary | ICD-10-CM

## 2017-07-04 LAB — BASIC METABOLIC PANEL
BUN: 12 mg/dL (ref 6–23)
CHLORIDE: 101 meq/L (ref 96–112)
CO2: 29 mEq/L (ref 19–32)
CREATININE: 0.97 mg/dL (ref 0.40–1.20)
Calcium: 10.5 mg/dL (ref 8.4–10.5)
GFR: 60.61 mL/min (ref >60.00)
Glucose, Bld: 105 mg/dL — ABNORMAL HIGH (ref 70–99)
Potassium: 4.1 mEq/L (ref 3.5–5.1)
Sodium: 137 mEq/L (ref 135–145)

## 2017-07-04 LAB — CBC
HEMATOCRIT: 37.6 % (ref 36.0–46.0)
Hemoglobin: 12 g/dL (ref 12.0–15.0)
MCHC: 31.8 g/dL (ref 30.0–36.0)
MCV: 75.1 fl — ABNORMAL LOW (ref 78.0–100.0)
Platelets: 258 10*3/uL (ref 150.0–400.0)
RBC: 5.01 Mil/uL (ref 3.87–5.11)
RDW: 15.8 % — AB (ref 11.5–15.5)
WBC: 5.2 10*3/uL (ref 4.0–10.5)

## 2017-07-04 LAB — TSH: TSH: 1.28 u[IU]/mL (ref 0.35–4.50)

## 2017-07-04 NOTE — Patient Instructions (Signed)
BEFORE YOU LEAVE: -Flu shot -Labs  -follow up: Annual wellness visit with Manuela Schwartz in the next several months, follow-up with Dr. Maudie Mercury in 3-4 months  Your thyroid was checked earlier this year.  Please discuss your concerns regarding thyroid disease further with your endocrinologist, to see if further testing is needed.  Please make an appointment with your ear nose and throat doctor about the swollen gland.  We have ordered labs or studies at this visit. It can take up to 1-2 weeks for results and processing. IF results require follow up or explanation, we will call you with instructions. Clinically stable results will be released to your [facility]. If you have not heard from [place] or cannot find your results in [facility] in 2 weeks please contact our office at [phone].  If you are not yet signed up for [facility], please consider signing up.   We recommend the following healthy lifestyle for LIFE: 1) Small portions. But, make sure to get regular (at least 3 per day), healthy meals and small healthy snacks if needed.  2) Eat a healthy clean diet.   TRY TO EAT: -at least 5-7 servings of low sugar, colorful, and nutrient rich vegetables per day (not corn, potatoes or bananas.) -berries are the best choice if you wish to eat fruit (only eat small amounts if trying to reduce weight)  -lean meets (fish, white meat of chicken or [place]) -vegan proteins for some meals - beans or tofu, whole grains, nuts and seeds -Replace bad fats with good fats - good fats include: fish, nuts and seeds, canola oil, olive oil -small amounts of low fat or non fat dairy -small amounts of100 % whole grains - check the lables -drink plenty of water  AVOID: -SUGAR, sweets, anything with added sugar, corn syrup or sweeteners - must read labels as even foods advertised as "healthy" often are loaded with sugar -if you must have a sweetener, small amounts of stevia may be best -sweetened beverages and artificially sweetened  beverages -simple starches (rice, bread, potatoes, pasta, chips, etc - small amounts of 100% whole grains are ok) -red meat, pork, butter -fried foods, fast food, processed food, excessive dairy, eggs and coconut.  3)Get at least 150 minutes of sweaty aerobic exercise per week.  4)Reduce stress - consider counseling, meditation and relaxation to balance other aspects of your life.

## 2017-07-04 NOTE — Progress Notes (Signed)
HPI:  [name] is a pleasant 66 year old with past medical history significant for hypertension, rheumatoid arthritis, hyperparathyroidism, neuropathy, anxiety, morbid obesity and hyperlipidemia here for follow-up.  She sees several specialists including neurologist and endocrinologist.  She is due for her flu shot and basic lab work related to her blood pressure.  She also requests a thyroid test today.  She feels like her weight and some hair issues may be related to thyroid issues.  She reports she has dry hair that breaks easily.  She also has a complaint of left tender gland in her neck.  She reports she has had this for over a month.  She saw her ear nose and throat doctor for this about a month ago per her report and was told it was okay, she had a sinus infection at the time.  Tender swelling has not resolved.  She has not followed up with her ear nose and throat specialist.  Denies fevers, sinus congestion, sinus pain, tooth infection, gum infection.  ROS: See pertinent positives and negatives per HPI.  Past Medical History:  Diagnosis Date  . Allergy   . Cataract    BILATERAL-REMOVED 2 YEARS AGO  . GERD (gastroesophageal reflux disease)   . Hx of colonic polyp - ssp 11/03/2014  . Hypertension   . 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 Osteopenia 10/17/2011   DEXA 09/2007: -1.4 L fem; 10/2011: -1.2 L fem   . PONV (postoperative nausea and vomiting)   . Pseudogout of foot   . Rheumatoid arthritis(714.0) dx 2010    Past Surgical History:  Procedure Laterality Date  . BREAST BIOPSY  1972  . Broken wrist  2010  . CATARACT EXTRACTION  03/2012   left  . COLONOSCOPY    . COSMETIC SURGERY    . FRACTURE SURGERY    . INNER EAR SURGERY     busted ear drum  . Lumbar one-Sacral one Maximum access posterior lumbar interbody fusion N/A 10/11/2016   Performed by Erline Levine, MD at [facility] OR  . pneumonia  2007    Family History  Problem Relation Age of Onset  . Stroke Mother   . Hypertension  Father   . Heart attack Father   . Hypertension Sister        x 3  . Hypertension Brother   . Hyperthyroidism Sister        x2, s/p RAI ablation  . Hypothyroidism Brother     Social History   Socioeconomic History  . Marital status: Married    Spouse name: None  . Number of children: 3  . Years of education: None  . Highest education level: None  Social Needs  . Financial resource strain: None  . Food insecurity - worry: None  . Food insecurity - inability: None  . Transportation needs - medical: None  . Transportation needs - non-medical: None  Occupational History  . Occupation: Retired  Tobacco Use  . Smoking status: Former Smoker    Types: Cigarettes  . Smokeless tobacco: Former Systems developer    Quit date: 08/19/1981  Substance and Sexual Activity  . Alcohol use: No    Alcohol/week: 0.0 oz  . Drug use: No  . Sexual activity: None  Other Topics Concern  . None  Social History Narrative   Artist -retired Building control surveyor   Married, lives with spouse, [name], he is IT support for [employer]   3 sons   2 caffeinated beverages a day   No regular  exercise, diet is ok     Current Outpatient Medications:  .  aspirin 81 MG tablet, Take 81 mg by mouth daily., Disp: , Rfl:  .  B Complex-C (B-COMPLEX WITH VITAMIN C) tablet, Take 2 tablets by mouth daily., Disp: , Rfl:  .  Cyanocobalamin (VITAMIN B-12 PO), Take 1 tablet by mouth daily. , Disp: , Rfl:  .  furosemide (LASIX) 20 MG tablet, TAKE 1 TABLET BY MOUTH ONCE DAILY, Disp: 30 tablet, Rfl: 5 .  gabapentin (NEURONTIN) 100 MG capsule, TAKE 2 CAPSULES BY MOUTH TWICE DAILY, Disp: 360 capsule, Rfl: 1 .  gabapentin (NEURONTIN) 300 MG capsule, Take 1 capsule (300 mg total) by mouth at bedtime., Disp: 90 capsule, Rfl: 3 .  guaiFENesin (MUCINEX) 600 MG 12 hr tablet, Take 600 mg by mouth 2 (two) times daily as needed for cough or to loosen phlegm. , Disp: , Rfl:  .  hydroxychloroquine (PLAQUENIL) 200 MG tablet, Take 200 mg  by mouth daily. Patient is currently taking, Disp: , Rfl:  .  losartan (COZAAR) 100 MG tablet, Take 1 tablet (100 mg total) by mouth daily., Disp: 90 tablet, Rfl: 3 .  metoprolol succinate (TOPROL-XL) 50 MG 24 hr tablet, Take 2 tablets (100 mg total) by mouth daily. Take with or immediately following a meal., Disp: 180 tablet, Rfl: 1 .  pyridOXINE (VITAMIN B-6) 100 MG tablet, Take 100 mg by mouth daily., Disp: , Rfl:  .  ranitidine (ZANTAC) 150 MG tablet, Take 150 mg by mouth daily as needed for heartburn. , Disp: , Rfl:  .  sulfaSALAzine (AZULFIDINE) 500 MG EC tablet, TAKE 2 TABLETS BY MOUTH TWICE DAILY, Disp: 120 tablet, Rfl: 0 .  thiamine (VITAMIN B-1) 100 MG tablet, Take 100 mg by mouth daily. , Disp: , Rfl:  .  tiZANidine (ZANAFLEX) 4 MG tablet, Take 0.5-1 tablets (2-4 mg total) by mouth every 6 (six) hours as needed for muscle spasms., Disp: 60 tablet, Rfl: 1 .  Vitamin D, Cholecalciferol, 400 units TABS, Take by mouth., Disp: , Rfl:   EXAM:  Vitals:   07/04/17 1304  BP: 138/80  Pulse: 67  Temp: 98.3 F (36.8 C)    Body mass index is 38.09 kg/m.  GENERAL: vitals reviewed and listed above, alert, oriented, appears well hydrated and in no acute distress  HEENT: atraumatic, conjunttiva clear, no obvious abnormalities on inspection of external nose and ears  NECK: She has tender left-sided sub-mental lymphadenopathy.  No other obvious masses on inspection  LUNGS: clear to auscultation bilaterally, no wheezes, rales or rhonchi, good air movement  CV: HRRR, no peripheral edema  MS: moves all extremities without noticeable abnormality  PSYCH: pleasant and cooperative, no obvious depression or anxiety  ASSESSMENT AND PLAN:  Discussed the following assessment and plan:  Essential hypertension - Plan: Basic metabolic panel, CBC  Morbid obesity ([place])  Hyperlipidemia, unspecified hyperlipidemia type  Lymphadenopathy  Need for immunization against influenza - Plan: Flu  vaccine HIGH DOSE PF (Fluzone High dose)  Hyperparathyroidism ([place]) - Plan: TSH  -I have advised that she follow-up with ear nose and throat doctor about the swollen gland -We will do some basic labs today, she had thyroid testing earlier this year she does want to check this again, advised that she discuss this further with her endocrinologist -Flu shot today -Lifestyle recommendations -Follow-up in 3-4 months -Patient advised to return or notify a doctor immediately if symptoms worsen or persist or new concerns arise.  Patient Instructions  BEFORE YOU  LEAVE: -Flu shot -Labs  -follow up: Annual wellness visit with Manuela Schwartz in the next several months, follow-up with Dr. Maudie Mercury in 3-4 months  Your thyroid was checked earlier this year.  Please discuss your concerns regarding thyroid disease further with your endocrinologist, to see if further testing is needed.  Please make an appointment with your ear nose and throat doctor about the swollen gland.  We have ordered labs or studies at this visit. It can take up to 1-2 weeks for results and processing. IF results require follow up or explanation, we will call you with instructions. Clinically stable results will be released to your [facility]. If you have not heard from [place] or cannot find your results in [facility] in 2 weeks please contact our office at [phone].  If you are not yet signed up for [facility], please consider signing up.   We recommend the following healthy lifestyle for LIFE: 1) Small portions. But, make sure to get regular (at least 3 per day), healthy meals and small healthy snacks if needed.  2) Eat a healthy clean diet.   TRY TO EAT: -at least 5-7 servings of low sugar, colorful, and nutrient rich vegetables per day (not corn, potatoes or bananas.) -berries are the best choice if you wish to eat fruit (only eat small amounts if trying to reduce weight)  -lean meets (fish, white meat of chicken or [place]) -vegan proteins for  some meals - beans or tofu, whole grains, nuts and seeds -Replace bad fats with good fats - good fats include: fish, nuts and seeds, canola oil, olive oil -small amounts of low fat or non fat dairy -small amounts of100 % whole grains - check the lables -drink plenty of water  AVOID: -SUGAR, sweets, anything with added sugar, corn syrup or sweeteners - must read labels as even foods advertised as "healthy" often are loaded with sugar -if you must have a sweetener, small amounts of stevia may be best -sweetened beverages and artificially sweetened beverages -simple starches (rice, bread, potatoes, pasta, chips, etc - small amounts of 100% whole grains are ok) -red meat, pork, butter -fried foods, fast food, processed food, excessive dairy, eggs and coconut.  3)Get at least 150 minutes of sweaty aerobic exercise per week.  4)Reduce stress - consider counseling, meditation and relaxation to balance other aspects of your life.            Colin Benton R., DO

## 2017-07-07 ENCOUNTER — Telehealth: Payer: Self-pay | Admitting: Family Medicine

## 2017-07-07 NOTE — Telephone Encounter (Signed)
Copied from [place] [id number]. Topic: Inquiry >> Jul 07, 2017  3:10 PM Bea Graff, NT wrote: Reason for CRM: Patient returning a call to Wendie Simmer. No CRM in chart of why patient was called.

## 2017-07-08 MED FILL — FUROSEMIDE 20 MG TABLET: 20 | 90 days supply | Qty: 90 | Fill #3

## 2017-07-08 MED FILL — GABAPENTIN 100 MG CAPS: 100 | 90 days supply | Qty: 360 | Fill #1

## 2017-07-08 MED FILL — HYDROXYCHLOROQUINE 200 MG T: 200 | 30 days supply | Qty: 60 | Fill #3

## 2017-07-08 NOTE — Telephone Encounter (Signed)
See results note. 

## 2017-07-15 DIAGNOSIS — K1120 Sialoadenitis, unspecified: Secondary | ICD-10-CM | POA: Diagnosis not present

## 2017-07-15 DIAGNOSIS — J322 Chronic ethmoidal sinusitis: Secondary | ICD-10-CM | POA: Diagnosis not present

## 2017-07-15 DIAGNOSIS — J320 Chronic maxillary sinusitis: Secondary | ICD-10-CM | POA: Diagnosis not present

## 2017-07-15 MED FILL — FLUCONAZOLE 100 MG TABLET: 100 | 10 days supply | Qty: 3 | Fill #0

## 2017-07-15 MED FILL — CEFUROXIME AXETIL 250 MG TA: 250 | 10 days supply | Qty: 20 | Fill #0

## 2017-07-16 ENCOUNTER — Encounter (HOSPITAL_COMMUNITY): Payer: Self-pay | Admitting: Emergency Medicine

## 2017-07-16 ENCOUNTER — Emergency Department (HOSPITAL_COMMUNITY): Payer: 59

## 2017-07-16 ENCOUNTER — Inpatient Hospital Stay (HOSPITAL_COMMUNITY)
Admission: EM | Admit: 2017-07-16 | Discharge: 2017-07-23 | DRG: 511 | Disposition: A | Discharge status: Skilled Nursing Facility | Payer: 59 | Attending: Internal Medicine | Admitting: Internal Medicine

## 2017-07-16 DIAGNOSIS — R221 Localized swelling, mass and lump, neck: Secondary | ICD-10-CM | POA: Diagnosis not present

## 2017-07-16 DIAGNOSIS — S82141A Displaced bicondylar fracture of right tibia, initial encounter for closed fracture: Secondary | ICD-10-CM | POA: Diagnosis not present

## 2017-07-16 DIAGNOSIS — I10 Essential (primary) hypertension: Secondary | ICD-10-CM | POA: Diagnosis present

## 2017-07-16 DIAGNOSIS — R599 Enlarged lymph nodes, unspecified: Secondary | ICD-10-CM | POA: Diagnosis present

## 2017-07-16 DIAGNOSIS — W19XXXA Unspecified fall, initial encounter: Secondary | ICD-10-CM | POA: Diagnosis not present

## 2017-07-16 DIAGNOSIS — Z7982 Long term (current) use of aspirin: Secondary | ICD-10-CM | POA: Diagnosis not present

## 2017-07-16 DIAGNOSIS — R05 Cough: Secondary | ICD-10-CM | POA: Diagnosis not present

## 2017-07-16 DIAGNOSIS — Z79899 Other long term (current) drug therapy: Secondary | ICD-10-CM

## 2017-07-16 DIAGNOSIS — M069 Rheumatoid arthritis, unspecified: Secondary | ICD-10-CM | POA: Diagnosis present

## 2017-07-16 DIAGNOSIS — Z981 Arthrodesis status: Secondary | ICD-10-CM | POA: Diagnosis not present

## 2017-07-16 DIAGNOSIS — R040 Epistaxis: Secondary | ICD-10-CM | POA: Diagnosis present

## 2017-07-16 DIAGNOSIS — G8918 Other acute postprocedural pain: Secondary | ICD-10-CM | POA: Diagnosis not present

## 2017-07-16 DIAGNOSIS — E559 Vitamin D deficiency, unspecified: Secondary | ICD-10-CM | POA: Diagnosis present

## 2017-07-16 DIAGNOSIS — S199XXA Unspecified injury of neck, initial encounter: Secondary | ICD-10-CM | POA: Diagnosis not present

## 2017-07-16 DIAGNOSIS — Z01818 Encounter for other preprocedural examination: Secondary | ICD-10-CM

## 2017-07-16 DIAGNOSIS — S0240CA Maxillary fracture, right side, initial encounter for closed fracture: Secondary | ICD-10-CM

## 2017-07-16 DIAGNOSIS — K219 Gastro-esophageal reflux disease without esophagitis: Secondary | ICD-10-CM | POA: Diagnosis present

## 2017-07-16 DIAGNOSIS — W109XXA Fall (on) (from) unspecified stairs and steps, initial encounter: Secondary | ICD-10-CM | POA: Diagnosis present

## 2017-07-16 DIAGNOSIS — Z9841 Cataract extraction status, right eye: Secondary | ICD-10-CM | POA: Diagnosis not present

## 2017-07-16 DIAGNOSIS — M25561 Pain in right knee: Secondary | ICD-10-CM | POA: Diagnosis not present

## 2017-07-16 DIAGNOSIS — Z87891 Personal history of nicotine dependence: Secondary | ICD-10-CM

## 2017-07-16 DIAGNOSIS — S92909A Unspecified fracture of unspecified foot, initial encounter for closed fracture: Secondary | ICD-10-CM | POA: Diagnosis not present

## 2017-07-16 DIAGNOSIS — S52501A Unspecified fracture of the lower end of right radius, initial encounter for closed fracture: Secondary | ICD-10-CM | POA: Diagnosis not present

## 2017-07-16 DIAGNOSIS — Y92009 Unspecified place in unspecified non-institutional (private) residence as the place of occurrence of the external cause: Secondary | ICD-10-CM

## 2017-07-16 DIAGNOSIS — S0990XA Unspecified injury of head, initial encounter: Secondary | ICD-10-CM | POA: Diagnosis not present

## 2017-07-16 DIAGNOSIS — S62101A Fracture of unspecified carpal bone, right wrist, initial encounter for closed fracture: Secondary | ICD-10-CM | POA: Diagnosis not present

## 2017-07-16 DIAGNOSIS — Z9842 Cataract extraction status, left eye: Secondary | ICD-10-CM | POA: Diagnosis not present

## 2017-07-16 DIAGNOSIS — S5290XA Unspecified fracture of unspecified forearm, initial encounter for closed fracture: Secondary | ICD-10-CM | POA: Diagnosis not present

## 2017-07-16 DIAGNOSIS — E213 Hyperparathyroidism, unspecified: Secondary | ICD-10-CM | POA: Diagnosis not present

## 2017-07-16 DIAGNOSIS — D509 Iron deficiency anemia, unspecified: Secondary | ICD-10-CM | POA: Diagnosis present

## 2017-07-16 DIAGNOSIS — M48062 Spinal stenosis, lumbar region with neurogenic claudication: Secondary | ICD-10-CM | POA: Diagnosis not present

## 2017-07-16 DIAGNOSIS — G8911 Acute pain due to trauma: Secondary | ICD-10-CM | POA: Diagnosis not present

## 2017-07-16 DIAGNOSIS — S0240DA Maxillary fracture, left side, initial encounter for closed fracture: Secondary | ICD-10-CM | POA: Diagnosis not present

## 2017-07-16 DIAGNOSIS — S82144A Nondisplaced bicondylar fracture of right tibia, initial encounter for closed fracture: Secondary | ICD-10-CM | POA: Diagnosis not present

## 2017-07-16 DIAGNOSIS — M25531 Pain in right wrist: Secondary | ICD-10-CM | POA: Diagnosis not present

## 2017-07-16 HISTORY — DX: Hypercalcemia: E83.52

## 2017-07-16 HISTORY — DX: Displaced bicondylar fracture of right tibia, initial encounter for closed fracture: S82.141A

## 2017-07-16 MED ORDER — MORPHINE SULFATE (PF) 4 MG/ML IV SOLN
4.0000 mg | INTRAVENOUS | Status: DC | PRN
Start: 2017-07-16 — End: 2017-07-17
  Administered 2017-07-16 (×2): 4 mg via INTRAVENOUS
  Filled 2017-07-16 (×2): qty 1

## 2017-07-16 MED ORDER — ONDANSETRON HCL 4 MG/2ML IJ SOLN
4.0000 mg | Freq: Once | INTRAMUSCULAR | Status: AC
  Administered 2017-07-16: 4 mg via INTRAVENOUS
  Filled 2017-07-16: qty 2

## 2017-07-16 NOTE — ED Provider Notes (Signed)
[facility] DEPT Provider Note   CSN: [id number] Arrival date & time: 07/16/17  1651     History   Chief Complaint Chief Complaint  Patient presents with  . Fall  . Facial Injury  . Epistaxis  . Wrist Pain    right  . Knee Pain    right    HPI AIREANA RYLAND is a 66 y.o. female. Chief complaint is fall with face, wrist, and knee pain.  HPI: Pleasant 66 year old female. Was carrying some laundry basket behind her Belenda Cruise down some steps. It slid forward and caught her feet she stumbled falling forward striking her face, right knee, and right wrist on the floor below the steps.  Past Medical History:  Diagnosis Date  . Allergy   . Cataract    BILATERAL-REMOVED 2 YEARS AGO  . GERD (gastroesophageal reflux disease)   . Hx of colonic polyp - ssp 11/03/2014  . Hypertension   . 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 Osteopenia 10/17/2011   DEXA 09/2007: -1.4 L fem; 10/2011: -1.2 L fem   . PONV (postoperative nausea and vomiting)   . Pseudogout of foot   . Rheumatoid arthritis(714.0) dx 2010    Patient Active Problem List   Diagnosis Date Noted  . Hyperparathyroidism ([place]) 05/06/2017  . Lumbar stenosis with neurogenic claudication 10/11/2016  . H/O breast augmentation 09/12/2016  . Obesity 09/12/2016  . L-S radiculopathy 05/30/2016  . Neck mass 05/30/2015  . Hx of colonic polyp - ssp 11/03/2014  . Neuropathy 07/06/2014  . Hyperlipidemia 06/02/2014  . Anxiety state 06/02/2014  . Allergic rhinitis   . Osteopenia 10/17/2011  . Rheumatoid arthritis ([place])   . Hypertension     Past Surgical History:  Procedure Laterality Date  . BREAST BIOPSY  1972  . Broken wrist  2010  . CATARACT EXTRACTION  03/2012   left  . COLONOSCOPY    . COSMETIC SURGERY    . FRACTURE SURGERY    . INNER EAR SURGERY     busted ear drum  . MAXIMUM ACCESS (MAS)POSTERIOR LUMBAR INTERBODY FUSION (PLIF) 1 LEVEL N/A 10/11/2016   Procedure: Lumbar one-Sacral one  Maximum access posterior lumbar interbody fusion;  Surgeon: Erline Levine, MD;  Location: [facility];  Service: Neurosurgery;  Laterality: N/A;  . pneumonia  2007    OB History    No data available       Home Medications    Prior to Admission medications   Medication Sig Start Date End Date Taking? Authorizing Provider  aspirin 81 MG tablet Take 81 mg by mouth at bedtime.    Yes [provider]  cefUROXime (CEFTIN) 250 MG tablet Take 250 mg by mouth 2 (two) times daily with a meal.  07/15/17  Yes [provider]  Cholecalciferol (VITAMIN D-3) 5000 units TABS Take 1 tablet by mouth daily.   Yes [provider]  furosemide (LASIX) 20 MG tablet TAKE 1 TABLET BY MOUTH ONCE DAILY 04/10/17  Yes Colin Benton R, DO  gabapentin (NEURONTIN) 100 MG capsule TAKE 2 CAPSULES BY MOUTH TWICE DAILY Patient taking differently: TAKE 2 CAPSULES BY MOUTH DAILY 04/03/17  Yes Colin Benton R, DO  gabapentin (NEURONTIN) 300 MG capsule Take 1 capsule (300 mg total) by mouth at bedtime. 06/13/17  Yes Patel, Donika K, DO  guaiFENesin (MUCINEX) 600 MG 12 hr tablet Take 600 mg by mouth 2 (two) times daily as needed for cough or to loosen phlegm.    Yes [provider]  hydroxychloroquine (PLAQUENIL) 200 MG tablet Take 200 mg by mouth daily. Patient is currently taking   Yes [provider]  losartan (COZAAR) 100 MG tablet Take 1 tablet (100 mg total) by mouth daily. 12/12/16  Yes Colin Benton R, DO  metoprolol succinate (TOPROL-XL) 50 MG 24 hr tablet Take 2 tablets (100 mg total) by mouth daily. Take with or immediately following a meal. Patient taking differently: Take 50 mg by mouth 2 (two) times daily. Take with or immediately following a meal. 02/13/17  Yes Colin Benton R, DO  ranitidine (ZANTAC) 150 MG tablet Take 150 mg by mouth daily as needed for heartburn.    Yes [provider]  sulfaSALAzine (AZULFIDINE) 500 MG EC tablet TAKE 2 TABLETS BY MOUTH TWICE DAILY 07/17/16   Yes Deveshwar, Abel Presto, MD  sulfaSALAzine (AZULFIDINE) 500 MG tablet Take 1,000 mg by mouth 2 (two) times daily.  06/20/17  Yes [provider]  tiZANidine (ZANAFLEX) 4 MG tablet Take 0.5-1 tablets (2-4 mg total) by mouth every 6 (six) hours as needed for muscle spasms. 10/14/16  Yes Erline Levine, MD  Vit B6-Vit B12-Omega 3 Acids (VITAMIN B PLUS+ PO) Take 1 tablet by mouth every evening.   Yes [provider]  B Complex-C (B-COMPLEX WITH VITAMIN C) tablet Take 2 tablets by mouth daily.    [provider]  Cyanocobalamin (VITAMIN B-12 PO) Take 1 tablet by mouth daily.     [provider]  fluconazole (DIFLUCAN) 100 MG tablet  07/15/17   [provider]  pyridOXINE (VITAMIN B-6) 100 MG tablet Take 100 mg by mouth daily.    [provider]  thiamine (VITAMIN B-1) 100 MG tablet Take 100 mg by mouth daily.     [provider]  Vitamin D, Cholecalciferol, 400 units TABS Take 1 tablet by mouth daily.     [provider]    Family History Family History  Problem Relation Age of Onset  . Stroke Mother   . Hypertension Father   . Heart attack Father   . Hypertension Sister        x 3  . Hypertension Brother   . Hyperthyroidism Sister        x2, s/p RAI ablation  . Hypothyroidism Brother     Social History Social History   Tobacco Use  . Smoking status: Former Smoker    Types: Cigarettes  . Smokeless tobacco: Former Systems developer    Quit date: 08/19/1981  Substance Use Topics  . Alcohol use: No    Alcohol/week: 0.0 oz  . Drug use: No     Allergies   Amlodipine besylate and Nortriptyline   Review of Systems Review of Systems  Constitutional: Negative for appetite change, chills, diaphoresis, fatigue and fever.  HENT: Positive for nosebleeds. Negative for mouth sores, sore throat and trouble swallowing.        Facial pain swelling and bruising  Eyes: Negative for visual disturbance.  Respiratory: Negative for cough,  chest tightness, shortness of breath and wheezing.   Cardiovascular: Negative for chest pain.  Gastrointestinal: Negative for abdominal distention, abdominal pain, diarrhea, nausea and vomiting.  Endocrine: Negative for polydipsia, polyphagia and polyuria.  Genitourinary: Negative for dysuria, frequency and hematuria.  Musculoskeletal: Negative for gait problem.       Right wrist, right knee pain  Skin: Negative for color change, pallor and rash.  Neurological: Negative for dizziness, syncope, light-headedness and headaches.  Hematological: Does not bruise/bleed easily.  Psychiatric/Behavioral: Negative for  behavioral problems and confusion.     Physical Exam Updated Vital Signs BP (!) 159/77 (BP Location: Left Arm)   Pulse 83   Temp 98.5 F (36.9 C) (Oral)   Resp 20   SpO2 98%   Physical Exam  Constitutional: She is oriented to person, place, and time. She appears well-developed and well-nourished. No distress.  HENT:  Head: Normocephalic.  Soft tissue swelling over the nose and left maxilla. No proptosis or enophthalmos. No blood over the TMs, mastoids, or from ears nose or mouth. She has left inferior lid ecchymosis. Flexor ocular movements without subjective diplopia. Normal V1 through V3 sensation. No malocclusion or dental trauma.  Eyes: Conjunctivae are normal. Pupils are equal, round, and reactive to light. No scleral icterus.  Neck: Normal range of motion. Neck supple. No thyromegaly present.  Cardiovascular: Normal rate and regular rhythm. Exam reveals no gallop and no friction rub.  No murmur heard. Pulmonary/Chest: Effort normal and breath sounds normal. No respiratory distress. She has no wheezes. She has no rales.  Abdominal: Soft. Bowel sounds are normal. She exhibits no distension. There is no tenderness. There is no rebound.  Musculoskeletal: Normal range of motion.  Soft tissue swelling without deformity at the distal radius. Soft tissue swelling and tenderness on  the right lateral knee.  Neurological: She is alert and oriented to person, place, and time.  Skin: Skin is warm and dry. No rash noted.  Psychiatric: She has a normal mood and affect. Her behavior is normal.     ED Treatments / Results  Labs (all labs ordered are listed, but only abnormal results are displayed) Labs Reviewed - No data to display  EKG  EKG Interpretation None       Radiology Dg Wrist Complete Right  Result Date: 07/16/2017 CLINICAL DATA:  Right wrist pain post fall. EXAM: RIGHT WRIST - COMPLETE 3+ VIEW COMPARISON:  05/09/2004 FINDINGS: There is a mildly comminuted impacted fracture of the distal radius with no significant displacement. The main horizontal fracture line is located 18 mm proximal to the radiocarpal joint. Additional longitudinal lucency extends to the radiocarpal joint. It is difficult to determine whether this particular lucency represents an intra-articular extension of the acute fracture or incomplete healing of the previous fracture demonstrated in 2005, as it has a strikingly similar appearance. Old posttraumatic changes from avulsion fracture of the ulnar styloid process. IMPRESSION: Mildly comminuted impacted fracture of the distal radius with no significant displacement. It is uncertain whether there is an intra-articular extension to the radiocarpal joint. Electronically Signed   By: Fidela Salisbury M.D.   On: 07/16/2017 18:29   Ct Head Wo Contrast  Result Date: 07/16/2017 CLINICAL DATA:  Fall while carrying laundry downstairs. EXAM: CT HEAD WITHOUT CONTRAST CT CERVICAL SPINE WITHOUT CONTRAST TECHNIQUE: Multidetector CT imaging of the head and cervical spine was performed following the standard protocol without intravenous contrast. Multiplanar CT image reconstructions of the cervical spine were also generated. COMPARISON:  None. FINDINGS: CT HEAD FINDINGS Brain: Mild generalized atrophy. Normal for age minimal chronic small vessel ischemia. No  intracranial hemorrhage, mass effect, or midline shift. No hydrocephalus. The basilar cisterns are patent. No evidence of territorial infarct or acute ischemia. No extra-axial or intracranial fluid collection. Vascular: Atherosclerosis of skullbase vasculature without hyperdense vessel or abnormal calcification. Skull: No fracture or focal lesion. Sinuses/Orbits: Minimally displaced fractures through the anterior and lateral walls the left maxillary sinus with associated hemosinus. Mucosal thickening of left ethmoid air cells. Mastoid air cells  are clear. Bilateral cataract resection. Other: Small right frontal scalp hematoma. CT CERVICAL SPINE FINDINGS Alignment: Normal. Skull base and vertebrae: No acute fracture. Vertebral body heights are maintained. The dens and skull base are intact. Soft tissues and spinal canal: No prevertebral fluid or swelling. No visible canal hematoma. Disc levels: Disc space narrowing and endplate spurring at X9-B7 and C6-C7. Scattered multilevel facet arthropathy. Upper chest: No acute abnormality. Enlarged right lobe with these thyroid gland containing small nodules, similar to prior neck CT 06/06/2015. Other: None. IMPRESSION: 1.  No acute intracranial abnormality. 2. Acute minimally displaced fracture anterior and lateral walls of the left maxillary sinus. 3. No fracture or subluxation of the cervical spine. Electronically Signed   By: Jeb Levering M.D.   On: 07/16/2017 21:10   Ct Cervical Spine Wo Contrast  Result Date: 07/16/2017 CLINICAL DATA:  Fall while carrying laundry downstairs. EXAM: CT HEAD WITHOUT CONTRAST CT CERVICAL SPINE WITHOUT CONTRAST TECHNIQUE: Multidetector CT imaging of the head and cervical spine was performed following the standard protocol without intravenous contrast. Multiplanar CT image reconstructions of the cervical spine were also generated. COMPARISON:  None. FINDINGS: CT HEAD FINDINGS Brain: Mild generalized atrophy. Normal for age minimal  chronic small vessel ischemia. No intracranial hemorrhage, mass effect, or midline shift. No hydrocephalus. The basilar cisterns are patent. No evidence of territorial infarct or acute ischemia. No extra-axial or intracranial fluid collection. Vascular: Atherosclerosis of skullbase vasculature without hyperdense vessel or abnormal calcification. Skull: No fracture or focal lesion. Sinuses/Orbits: Minimally displaced fractures through the anterior and lateral walls the left maxillary sinus with associated hemosinus. Mucosal thickening of left ethmoid air cells. Mastoid air cells are clear. Bilateral cataract resection. Other: Small right frontal scalp hematoma. CT CERVICAL SPINE FINDINGS Alignment: Normal. Skull base and vertebrae: No acute fracture. Vertebral body heights are maintained. The dens and skull base are intact. Soft tissues and spinal canal: No prevertebral fluid or swelling. No visible canal hematoma. Disc levels: Disc space narrowing and endplate spurring at J6-R6 and C6-C7. Scattered multilevel facet arthropathy. Upper chest: No acute abnormality. Enlarged right lobe with these thyroid gland containing small nodules, similar to prior neck CT 06/06/2015. Other: None. IMPRESSION: 1.  No acute intracranial abnormality. 2. Acute minimally displaced fracture anterior and lateral walls of the left maxillary sinus. 3. No fracture or subluxation of the cervical spine. Electronically Signed   By: Jeb Levering M.D.   On: 07/16/2017 21:10   Ct Knee Right Wo Contrast  Result Date: 07/16/2017 CLINICAL DATA:  Acute tibial plateau fracture, right knee pain. Fall carrying [employer]. EXAM: CT OF THE RIGHT KNEE WITHOUT CONTRAST TECHNIQUE: Multidetector CT imaging of the RIGHT knee was performed according to the standard protocol. Multiplanar CT image reconstructions were also generated. COMPARISON:  Radiograph earlier this day FINDINGS: Bones/Joint/Cartilage Acute comminuted lateral tibial plateau  fracture. Mild displacement involving the anterior-most cortex. Only minimal articular surface depression, less than 1 mm. Fracture extends to the tibial spines posteriorly. No vertically-oriented component. No metaphyseal component. Moderate lipohemarthrosis. No additional fracture of the patella, distal femur, or proximal fibula. Medial and lateral tibiofemoral chondrocalcinosis incidentally noted. Ligaments Suboptimally assessed by CT.  Possible ACL thickening. Muscles and Tendons No intramuscular hematoma. Quadriceps and patellar tendons are intact. Soft tissues Soft tissue edema about the knee. IMPRESSION: Schatzker type 3 minimally depressed lateral tibial plateau fracture. Electronically Signed   By: Jeb Levering M.D.   On: 07/16/2017 21:22   Dg Knee Complete 4 Views Right  Result Date:  07/16/2017 CLINICAL DATA:  Recent fall downstairs, generalized right knee pain EXAM: RIGHT KNEE - COMPLETE 4+ VIEW COMPARISON:  None available FINDINGS: Mild degenerative changes noted with chondrocalcinosis. Slight osteopenia. Joint effusion noted on the lateral view. Acute minimally depressed lateral tibial plateau fracture noted on the oblique views. IMPRESSION: Acute minimally depressed lateral tibial plateau fracture and likely an associated right knee effusion/ hemarthrosis. Right knee degenerative change with chondrocalcinosis. Electronically Signed   By: Jerilynn Mages.  Shick M.D.   On: 07/16/2017 18:17    Procedures Procedures (including critical care time)  Medications Ordered in ED Medications  morphine 4 MG/ML injection 4 mg (4 mg Intravenous Given 07/16/17 2126)  ondansetron (ZOFRAN) injection 4 mg (4 mg Intravenous Given 07/16/17 2125)     Initial Impression / Assessment and Plan / ED Course  I have reviewed the triage vital signs and the nursing notes.  Pertinent labs & imaging results that were available during my care of the patient were reviewed by me and considered in my medical decision making  (see chart for details).    CT of maxillofacial shows left anterior and medial wall of the left next sinus fracture. No superior wall or orbital floor fracture/blowout. She has tibial plateau fracture on plain films, and by CT. She has nondisplaced complex radius fracture.  I discussed the case with Dr. Fredonia Highland on call for orthopedic. Dr. Percell Miller as always return my call immediately and we've discussed the patient. She's not tolerating symptoms well and has difficulty manipulating even in bed, let alone with a raised walker. She'll be admitted. I discussed case with Dr. Inda Merlin. Dr. Percell Miller did recommend transfer to [facility] for her planned for the procedure.  Final Clinical Impressions(s) / ED Diagnoses   Final diagnoses:  Closed fracture of right tibial plateau, initial encounter  Closed fracture of right wrist, initial encounter  Closed fracture of right side of maxilla, initial encounter [facility])    ED Discharge Orders    None       Tanna Furry, MD 07/16/17 2303

## 2017-07-16 NOTE — H&P (Signed)
History and Physical    [name] WNU:[id number] DOB: 1950/11/25 DOA: 07/16/2017  PCP: Lucretia Kern, DO Consultants:  Marijean Bravo - rheumatology; Posey Pronto - neurology; Heywood Bene - Endocrinology; Vertell Limber - neurosurgery; [facility] - ENT; [facility] - vascular Patient coming from:  Home - lives with husband; NOK: husband  Chief Complaint: fall  HPI: [name] is a 66 y.o. female with medical history significant of RA; pseudogout; HTN; and GERD presenting following a fall.    Patient was carrying laundry down the stairs.  She was about 2-3 steps down and she slipped.  She lost her footing and went headfirst down the stairs.  She slid on her chest and rammed her face into the floor.  She injured her left face and right wrist and knee.  Her hip was hurting a bit but that seems to be okay.  She has a h/o swollen LM in her left submandibular neck years ago and it recurred recently.  She also had hypercalcemia; she was placed on vitamin D for vitamin D deficiency.  She saw ENT twice including yesterday and he has placed her on 2 consecutive antibiotics.  ED Course: L maxillar sinus fracture, R tibial plateau fracture, R radial fracture.  Orthopedics suggest outpatient f/u vs. Possible inpatient tibial plateau fracture repair.  Needs to go to [facility].  Review of Systems: As per HPI; otherwise review of systems reviewed and negative.   Ambulatory Status:  Ambulated without assistance prior to this injury  Past Medical History:  Diagnosis Date  . Allergy   . Cataract    BILATERAL-REMOVED 2 YEARS AGO  . GERD (gastroesophageal reflux disease)   . Hx of colonic polyp - ssp 11/03/2014  . Hypercalcemia   . Hypertension   . 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 Osteopenia 10/17/2011   DEXA 09/2007: -1.4 L fem; 10/2011: -1.2 L fem   . PONV (postoperative nausea and vomiting)   . Pseudogout of foot   . Rheumatoid arthritis(714.0) dx 2010    Past Surgical History:  Procedure Laterality Date  . BREAST BIOPSY   1972  . Broken wrist  2010  . CATARACT EXTRACTION  03/2012   left  . COLONOSCOPY    . COSMETIC SURGERY    . FRACTURE SURGERY    . INNER EAR SURGERY     busted ear drum  . MAXIMUM ACCESS (MAS)POSTERIOR LUMBAR INTERBODY FUSION (PLIF) 1 LEVEL N/A 10/11/2016   Procedure: Lumbar one-Sacral one Maximum access posterior lumbar interbody fusion;  Surgeon: Erline Levine, MD;  Location: [facility];  Service: Neurosurgery;  Laterality: N/A;  . pneumonia  2007    Social History   Socioeconomic History  . Marital status: Married    Spouse name: Not on file  . Number of children: 3  . Years of education: Not on file  . Highest education level: Not on file  Social Needs  . Financial resource strain: Not on file  . Food insecurity - worry: Not on file  . Food insecurity - inability: Not on file  . Transportation needs - medical: Not on file  . Transportation needs - non-medical: Not on file  Occupational History  . Occupation: Retired  Tobacco Use  . Smoking status: Former Smoker    Packs/day: 4.00    Years: 4.00    Pack years: 16.00    Types: Cigarettes    Last attempt to quit: 1985    Years since quitting: 33.9  . Smokeless tobacco: Former Leisure centre manager  date: 08/19/1981  Substance and Sexual Activity  . Alcohol use: No    Alcohol/week: 0.0 oz  . Drug use: No  . Sexual activity: Not on file  Other Topics Concern  . Not on file  Social History Narrative   Artist -retired Building control surveyor   Married, lives with spouse, [name], he is IT support for [employer]   3 sons   2 caffeinated beverages a day   No regular exercise, diet is ok    Allergies  Allergen Reactions  . Amlodipine Besylate     Tremors  . Nortriptyline Other (See Comments)    tremors    Family History  Problem Relation Age of Onset  . Stroke Mother   . Hypertension Father   . Heart attack Father   . Hypertension Sister        x 3  . Hypertension Brother   . Hyperthyroidism Sister        x2, s/p  RAI ablation  . Hypothyroidism Brother     Prior to Admission medications   Medication Sig Start Date End Date Taking? Authorizing Provider  aspirin 81 MG tablet Take 81 mg by mouth at bedtime.    Yes [provider]  cefUROXime (CEFTIN) 250 MG tablet Take 250 mg by mouth 2 (two) times daily with a meal.  07/15/17  Yes [provider]  Cholecalciferol (VITAMIN D-3) 5000 units TABS Take 1 tablet by mouth daily.   Yes [provider]  furosemide (LASIX) 20 MG tablet TAKE 1 TABLET BY MOUTH ONCE DAILY 04/10/17  Yes Colin Benton R, DO  gabapentin (NEURONTIN) 100 MG capsule TAKE 2 CAPSULES BY MOUTH TWICE DAILY Patient taking differently: TAKE 2 CAPSULES BY MOUTH DAILY 04/03/17  Yes Colin Benton R, DO  gabapentin (NEURONTIN) 300 MG capsule Take 1 capsule (300 mg total) by mouth at bedtime. 06/13/17  Yes Patel, Donika K, DO  guaiFENesin (MUCINEX) 600 MG 12 hr tablet Take 600 mg by mouth 2 (two) times daily as needed for cough or to loosen phlegm.    Yes [provider]  hydroxychloroquine (PLAQUENIL) 200 MG tablet Take 200 mg by mouth daily. Patient is currently taking   Yes [provider]  losartan (COZAAR) 100 MG tablet Take 1 tablet (100 mg total) by mouth daily. 12/12/16  Yes Colin Benton R, DO  metoprolol succinate (TOPROL-XL) 50 MG 24 hr tablet Take 2 tablets (100 mg total) by mouth daily. Take with or immediately following a meal. Patient taking differently: Take 50 mg by mouth 2 (two) times daily. Take with or immediately following a meal. 02/13/17  Yes Colin Benton R, DO  ranitidine (ZANTAC) 150 MG tablet Take 150 mg by mouth daily as needed for heartburn.    Yes [provider]  sulfaSALAzine (AZULFIDINE) 500 MG EC tablet TAKE 2 TABLETS BY MOUTH TWICE DAILY 07/17/16  Yes Deveshwar, Abel Presto, MD  sulfaSALAzine (AZULFIDINE) 500 MG tablet Take 1,000 mg by mouth 2 (two) times daily.  06/20/17  Yes [provider]  tiZANidine (ZANAFLEX) 4 MG  tablet Take 0.5-1 tablets (2-4 mg total) by mouth every 6 (six) hours as needed for muscle spasms. 10/14/16  Yes Erline Levine, MD  Vit B6-Vit B12-Omega 3 Acids (VITAMIN B PLUS+ PO) Take 1 tablet by mouth every evening.   Yes [provider]  B Complex-C (B-COMPLEX WITH VITAMIN C) tablet Take 2 tablets by mouth daily.    [provider]  Cyanocobalamin (VITAMIN B-12 PO) Take  1 tablet by mouth daily.     [provider]  fluconazole (DIFLUCAN) 100 MG tablet  07/15/17   [provider]  pyridOXINE (VITAMIN B-6) 100 MG tablet Take 100 mg by mouth daily.    [provider]  thiamine (VITAMIN B-1) 100 MG tablet Take 100 mg by mouth daily.     [provider]  Vitamin D, Cholecalciferol, 400 units TABS Take 1 tablet by mouth daily.     [provider]    Physical Exam: Vitals:   07/16/17 1707 07/16/17 2122 07/16/17 2123  BP: (!) 200/88  (!) 159/77  Pulse: 71 80 83  Resp: 19 20 20   Temp: 98.3 F (36.8 C) 98.5 F (36.9 C) 98.5 F (36.9 C)  TempSrc: Oral Oral Oral  SpO2: 98% 98% 98%     General:  Appears bruised and shaken up Eyes:  PERRL, EOMI, normal lids, iris ENT:  grossly normal hearing, lips & tongue, mmm; partial dentures removed.  Worsening facial ecchymoses particularly in the left maxillary region. Neck:  Small L submandibular lymph node swelling; no masses or thyromegaly; no carotid bruits Cardiovascular:  RRR, no m/r/g. No LE edema.  Respiratory:   CTA bilaterally with no wheezes/rales/rhonchi.  Normal respiratory effort. Abdomen:  soft, NT, ND, NABS Skin:  no rash or induration seen on limited exam other than previously described facial trauma Musculoskeletal: R arm is splinted and in a sling; R knee immobilizer in place Lower extremity: No LE edema.  Limited foot exam with no ulcerations.  2+ distal pulses. Psychiatric: blunted mood and affect, speech fluent and appropriate, AOx3 Neurologic:  CN 2-12 grossly  intact, moves left extremities in coordinated fashion, sensation intact    Radiological Exams on Admission: Dg Wrist Complete Right  Result Date: 07/16/2017 CLINICAL DATA:  Right wrist pain post fall. EXAM: RIGHT WRIST - COMPLETE 3+ VIEW COMPARISON:  05/09/2004 FINDINGS: There is a mildly comminuted impacted fracture of the distal radius with no significant displacement. The main horizontal fracture line is located 18 mm proximal to the radiocarpal joint. Additional longitudinal lucency extends to the radiocarpal joint. It is difficult to determine whether this particular lucency represents an intra-articular extension of the acute fracture or incomplete healing of the previous fracture demonstrated in 2005, as it has a strikingly similar appearance. Old posttraumatic changes from avulsion fracture of the ulnar styloid process. IMPRESSION: Mildly comminuted impacted fracture of the distal radius with no significant displacement. It is uncertain whether there is an intra-articular extension to the radiocarpal joint. Electronically Signed   By: Fidela Salisbury M.D.   On: 07/16/2017 18:29   Ct Head Wo Contrast  Result Date: 07/16/2017 CLINICAL DATA:  Fall while carrying laundry downstairs. EXAM: CT HEAD WITHOUT CONTRAST CT CERVICAL SPINE WITHOUT CONTRAST TECHNIQUE: Multidetector CT imaging of the head and cervical spine was performed following the standard protocol without intravenous contrast. Multiplanar CT image reconstructions of the cervical spine were also generated. COMPARISON:  None. FINDINGS: CT HEAD FINDINGS Brain: Mild generalized atrophy. Normal for age minimal chronic small vessel ischemia. No intracranial hemorrhage, mass effect, or midline shift. No hydrocephalus. The basilar cisterns are patent. No evidence of territorial infarct or acute ischemia. No extra-axial or intracranial fluid collection. Vascular: Atherosclerosis of skullbase vasculature without hyperdense vessel or abnormal  calcification. Skull: No fracture or focal lesion. Sinuses/Orbits: Minimally displaced fractures through the anterior and lateral walls the left maxillary sinus with associated hemosinus. Mucosal thickening of left ethmoid air cells. Mastoid air cells are  clear. Bilateral cataract resection. Other: Small right frontal scalp hematoma. CT CERVICAL SPINE FINDINGS Alignment: Normal. Skull base and vertebrae: No acute fracture. Vertebral body heights are maintained. The dens and skull base are intact. Soft tissues and spinal canal: No prevertebral fluid or swelling. No visible canal hematoma. Disc levels: Disc space narrowing and endplate spurring at V5-I4 and C6-C7. Scattered multilevel facet arthropathy. Upper chest: No acute abnormality. Enlarged right lobe with these thyroid gland containing small nodules, similar to prior neck CT 06/06/2015. Other: None. IMPRESSION: 1.  No acute intracranial abnormality. 2. Acute minimally displaced fracture anterior and lateral walls of the left maxillary sinus. 3. No fracture or subluxation of the cervical spine. Electronically Signed   By: Jeb Levering M.D.   On: 07/16/2017 21:10   Ct Cervical Spine Wo Contrast  Result Date: 07/16/2017 CLINICAL DATA:  Fall while carrying laundry downstairs. EXAM: CT HEAD WITHOUT CONTRAST CT CERVICAL SPINE WITHOUT CONTRAST TECHNIQUE: Multidetector CT imaging of the head and cervical spine was performed following the standard protocol without intravenous contrast. Multiplanar CT image reconstructions of the cervical spine were also generated. COMPARISON:  None. FINDINGS: CT HEAD FINDINGS Brain: Mild generalized atrophy. Normal for age minimal chronic small vessel ischemia. No intracranial hemorrhage, mass effect, or midline shift. No hydrocephalus. The basilar cisterns are patent. No evidence of territorial infarct or acute ischemia. No extra-axial or intracranial fluid collection. Vascular: Atherosclerosis of skullbase vasculature without  hyperdense vessel or abnormal calcification. Skull: No fracture or focal lesion. Sinuses/Orbits: Minimally displaced fractures through the anterior and lateral walls the left maxillary sinus with associated hemosinus. Mucosal thickening of left ethmoid air cells. Mastoid air cells are clear. Bilateral cataract resection. Other: Small right frontal scalp hematoma. CT CERVICAL SPINE FINDINGS Alignment: Normal. Skull base and vertebrae: No acute fracture. Vertebral body heights are maintained. The dens and skull base are intact. Soft tissues and spinal canal: No prevertebral fluid or swelling. No visible canal hematoma. Disc levels: Disc space narrowing and endplate spurring at P3-I9 and C6-C7. Scattered multilevel facet arthropathy. Upper chest: No acute abnormality. Enlarged right lobe with these thyroid gland containing small nodules, similar to prior neck CT 06/06/2015. Other: None. IMPRESSION: 1.  No acute intracranial abnormality. 2. Acute minimally displaced fracture anterior and lateral walls of the left maxillary sinus. 3. No fracture or subluxation of the cervical spine. Electronically Signed   By: Jeb Levering M.D.   On: 07/16/2017 21:10   Ct Knee Right Wo Contrast  Result Date: 07/16/2017 CLINICAL DATA:  Acute tibial plateau fracture, right knee pain. Fall carrying [employer]. EXAM: CT OF THE RIGHT KNEE WITHOUT CONTRAST TECHNIQUE: Multidetector CT imaging of the RIGHT knee was performed according to the standard protocol. Multiplanar CT image reconstructions were also generated. COMPARISON:  Radiograph earlier this day FINDINGS: Bones/Joint/Cartilage Acute comminuted lateral tibial plateau fracture. Mild displacement involving the anterior-most cortex. Only minimal articular surface depression, less than 1 mm. Fracture extends to the tibial spines posteriorly. No vertically-oriented component. No metaphyseal component. Moderate lipohemarthrosis. No additional fracture of the patella, distal  femur, or proximal fibula. Medial and lateral tibiofemoral chondrocalcinosis incidentally noted. Ligaments Suboptimally assessed by CT.  Possible ACL thickening. Muscles and Tendons No intramuscular hematoma. Quadriceps and patellar tendons are intact. Soft tissues Soft tissue edema about the knee. IMPRESSION: Schatzker type 3 minimally depressed lateral tibial plateau fracture. Electronically Signed   By: Jeb Levering M.D.   On: 07/16/2017 21:22   Dg Knee Complete 4 Views Right  Result Date: 07/16/2017  CLINICAL DATA:  Recent fall downstairs, generalized right knee pain EXAM: RIGHT KNEE - COMPLETE 4+ VIEW COMPARISON:  None available FINDINGS: Mild degenerative changes noted with chondrocalcinosis. Slight osteopenia. Joint effusion noted on the lateral view. Acute minimally depressed lateral tibial plateau fracture noted on the oblique views. IMPRESSION: Acute minimally depressed lateral tibial plateau fracture and likely an associated right knee effusion/ hemarthrosis. Right knee degenerative change with chondrocalcinosis. Electronically Signed   By: Jerilynn Mages.  Shick M.D.   On: 07/16/2017 18:17    EKG: pending   Labs on Admission: I have personally reviewed the available labs and imaging studies at the time of the admission.  Pertinent labs:   Glucose 105 TSH 1.28 Vitamin D 14.17 in 9/18   Assessment/Plan Principal Problem:   Fall at home, initial encounter Active Problems:   Rheumatoid arthritis ([place])   Hypertension   Neck mass   Hyperparathyroidism ([place])   Tibial plateau fracture, right, closed, initial encounter   Left maxillary fracture ([place])   Closed right radial fracture   Fall with multiple fractures -Mechanical fall resulting in multiple fractures -Orthopedics consult in AM -Maxillary fracture is unlikely to require operative intervention -Radial fracture has been splinted and is unlikely to require operative intervention -Her tibial plateau fracture does need repair but the  edema needs to improve first; this may take days to a week.  The surgery may happen during this hospitalization or may be delayed until she is able to return for surgery. -Dr. Percell Miller has requested that patient be transferred to [facility] -CXR and EKG prior to surgery -Pain control with Vicodin and morphine -SW consult for rehab placement and also CM consult for [facility] services and equipment, as it is not entirely clear where the patient will best recover functionality at this time. -Will need PT consult post-operatively or possibly sooner if surgery is significantly delayed. -Will attempt to avoid foley placement with use of PureWick  Neck mass -She saw Dr. Claria Dice for this just yesterday and by her description it sounds as though he thinks this is a reactive lymph node -Will continue Ceftin as ordered  Hyperparathyroidism -Followed by endocrinology and PCP -TSH checked by PCP last week and was normal -Continue vitamin D supplementation  HTN -Continue Cozaar and Toprol XL -Hold Lasix for now  RA -Continue Plaquenil and Sulfasalazine   DVT prophylaxis: Heparin Code Status:  Full - confirmed with patient/family Family Communication: Husband present throughout evaluation  Disposition Plan: To be determined Consults called: Orthopedics; CM/SW/Nutrition Admission status: Admit - It is my clinical opinion that admission to INPATIENT is reasonable and necessary because this patient will require at least 2 midnights in the hospital to treat this condition based on the medical complexity of the problems presented.  Given the aforementioned information, the predictability of an adverse outcome is felt to be significant.    Karmen Bongo MD [facility]  If note is complete, please contact covering daytime or nighttime physician. [email or web address] Password [email or web address]  07/17/2017, 1:29 AM

## 2017-07-16 NOTE — ED Triage Notes (Signed)
Patient states that she was carrying laundry down the stairs when she fell. Patient hit face, denies LOC or taking blood thinners. Patient has scratch on nose and reports that nose has ben bleeding.

## 2017-07-17 ENCOUNTER — Other Ambulatory Visit: Payer: Self-pay

## 2017-07-17 ENCOUNTER — Inpatient Hospital Stay (HOSPITAL_COMMUNITY): Payer: 59

## 2017-07-17 ENCOUNTER — Encounter (HOSPITAL_COMMUNITY): Payer: Self-pay | Admitting: General Practice

## 2017-07-17 DIAGNOSIS — M069 Rheumatoid arthritis, unspecified: Secondary | ICD-10-CM

## 2017-07-17 DIAGNOSIS — E213 Hyperparathyroidism, unspecified: Secondary | ICD-10-CM

## 2017-07-17 DIAGNOSIS — S0240DA Maxillary fracture, left side, initial encounter for closed fracture: Secondary | ICD-10-CM

## 2017-07-17 DIAGNOSIS — R221 Localized swelling, mass and lump, neck: Secondary | ICD-10-CM

## 2017-07-17 DIAGNOSIS — I10 Essential (primary) hypertension: Secondary | ICD-10-CM

## 2017-07-17 HISTORY — DX: Maxillary fracture, left side, initial encounter for closed fracture: S02.40DA

## 2017-07-17 LAB — BASIC METABOLIC PANEL
ANION GAP: 6 (ref 5–15)
BUN: 12 mg/dL (ref 6–20)
CHLORIDE: 102 mmol/L (ref 101–111)
CO2: 27 mmol/L (ref 22–32)
Calcium: 10 mg/dL (ref 8.9–10.3)
Creatinine, Ser: 0.86 mg/dL (ref 0.44–1.00)
GFR calc non Af Amer: >60 mL/min (ref >60)
GLUCOSE: 118 mg/dL — AB (ref 65–99)
POTASSIUM: 3.5 mmol/L (ref 3.5–5.1)
Sodium: 135 mmol/L (ref 135–145)

## 2017-07-17 LAB — CBC
HEMATOCRIT: 36 % (ref 36.0–46.0)
HEMOGLOBIN: 11.3 g/dL — AB (ref 12.0–15.0)
MCH: 23.7 pg — ABNORMAL LOW (ref 26.0–34.0)
MCHC: 31.4 g/dL (ref 30.0–36.0)
MCV: 75.5 fL — AB (ref 78.0–100.0)
Platelets: 197 10*3/uL (ref 150–400)
RBC: 4.77 MIL/uL (ref 3.87–5.11)
RDW: 14.6 % (ref 11.5–15.5)
WBC: 9.2 10*3/uL (ref 4.0–10.5)

## 2017-07-17 MED ORDER — SULFASALAZINE 500 MG PO TBEC
1000.0000 mg | DELAYED_RELEASE_TABLET | Freq: Two times a day (BID) | ORAL | Status: DC
  Administered 2017-07-17 – 2017-07-23 (×11): 1000 mg via ORAL
  Filled 2017-07-17 (×14): qty 2

## 2017-07-17 MED ORDER — HYDROXYCHLOROQUINE SULFATE 200 MG PO TABS
200.0000 mg | ORAL_TABLET | Freq: Every day | ORAL | Status: DC
Start: 2017-07-17 — End: 2017-07-23
  Administered 2017-07-19 – 2017-07-23 (×5): 200 mg via ORAL
  Filled 2017-07-17 (×6): qty 1

## 2017-07-17 MED ORDER — METOPROLOL SUCCINATE ER 50 MG PO TB24
50.0000 mg | ORAL_TABLET | Freq: Two times a day (BID) | ORAL | Status: DC
  Administered 2017-07-17 – 2017-07-23 (×12): 50 mg via ORAL
  Filled 2017-07-17 (×12): qty 1

## 2017-07-17 MED ORDER — HEPARIN SODIUM (PORCINE) 5000 UNIT/ML IJ SOLN
5000.0000 [IU] | Freq: Three times a day (TID) | INTRAMUSCULAR | Status: DC
  Administered 2017-07-17 – 2017-07-23 (×15): 5000 [IU] via SUBCUTANEOUS
  Filled 2017-07-17 (×16): qty 1

## 2017-07-17 MED ORDER — KETOROLAC TROMETHAMINE 30 MG/ML IJ SOLN
30.0000 mg | Freq: Three times a day (TID) | INTRAMUSCULAR | Status: AC | PRN
Start: 2017-07-17 — End: 2017-07-22
  Administered 2017-07-17 – 2017-07-21 (×8): 30 mg via INTRAVENOUS
  Filled 2017-07-17 (×8): qty 1

## 2017-07-17 MED ORDER — HYDROCODONE-ACETAMINOPHEN 5-325 MG PO TABS
1.0000 | ORAL_TABLET | Freq: Four times a day (QID) | ORAL | Status: DC | PRN
  Administered 2017-07-21: 2 via ORAL
  Administered 2017-07-21: 1 via ORAL
  Administered 2017-07-21: 2 via ORAL
  Filled 2017-07-17 (×5): qty 2

## 2017-07-17 MED ORDER — MORPHINE SULFATE (PF) 4 MG/ML IV SOLN
2.0000 mg | INTRAVENOUS | Status: DC | PRN
  Administered 2017-07-17 – 2017-07-22 (×9): 2 mg via INTRAVENOUS
  Filled 2017-07-17 (×9): qty 1

## 2017-07-17 MED ORDER — TIZANIDINE HCL 4 MG PO TABS
2.0000 mg | ORAL_TABLET | Freq: Four times a day (QID) | ORAL | Status: DC | PRN
  Administered 2017-07-19: 4 mg via ORAL
  Filled 2017-07-17 (×2): qty 1

## 2017-07-17 MED ORDER — VITAMIN D 1000 UNITS PO TABS
5000.0000 [IU] | ORAL_TABLET | Freq: Every day | ORAL | Status: DC
  Administered 2017-07-19 – 2017-07-23 (×5): 5000 [IU] via ORAL
  Filled 2017-07-17 (×6): qty 5

## 2017-07-17 MED ORDER — GABAPENTIN 100 MG PO CAPS
200.0000 mg | ORAL_CAPSULE | Freq: Every day | ORAL | Status: DC
  Administered 2017-07-19 – 2017-07-23 (×5): 200 mg via ORAL
  Filled 2017-07-17 (×6): qty 2

## 2017-07-17 MED ORDER — ONDANSETRON HCL 4 MG/2ML IJ SOLN
4.0000 mg | INTRAMUSCULAR | Status: DC | PRN
  Administered 2017-07-17 – 2017-07-22 (×9): 4 mg via INTRAVENOUS
  Filled 2017-07-17 (×10): qty 2

## 2017-07-17 MED ORDER — LOSARTAN POTASSIUM 50 MG PO TABS
100.0000 mg | ORAL_TABLET | Freq: Every day | ORAL | Status: DC
  Administered 2017-07-19 – 2017-07-23 (×5): 100 mg via ORAL
  Filled 2017-07-17 (×7): qty 2

## 2017-07-17 MED ORDER — POLYETHYLENE GLYCOL 3350 17 G PO PACK
17.0000 g | PACK | Freq: Every day | ORAL | Status: DC | PRN
  Administered 2017-07-21: 17 g via ORAL
  Filled 2017-07-17 (×2): qty 1

## 2017-07-17 MED ORDER — DOCUSATE SODIUM 100 MG PO CAPS
100.0000 mg | ORAL_CAPSULE | Freq: Two times a day (BID) | ORAL | Status: DC
  Administered 2017-07-17 – 2017-07-22 (×10): 100 mg via ORAL
  Filled 2017-07-17 (×11): qty 1

## 2017-07-17 MED ORDER — CEFUROXIME AXETIL 250 MG PO TABS
250.0000 mg | ORAL_TABLET | Freq: Two times a day (BID) | ORAL | Status: AC
  Administered 2017-07-17 – 2017-07-19 (×4): 250 mg via ORAL
  Filled 2017-07-17 (×6): qty 1

## 2017-07-17 MED ORDER — ONDANSETRON HCL 4 MG/2ML IJ SOLN
4.0000 mg | Freq: Four times a day (QID) | INTRAMUSCULAR | Status: DC | PRN
  Administered 2017-07-17: 4 mg via INTRAVENOUS
  Filled 2017-07-17: qty 2

## 2017-07-17 MED ORDER — ONDANSETRON HCL 4 MG/2ML IJ SOLN
4.0000 mg | Freq: Once | INTRAMUSCULAR | Status: AC
  Administered 2017-07-17: 4 mg via INTRAVENOUS
  Filled 2017-07-17: qty 2

## 2017-07-17 MED ORDER — HYDROMORPHONE HCL 1 MG/ML IJ SOLN
0.5000 mg | INTRAMUSCULAR | Status: DC | PRN
  Administered 2017-07-17: 0.5 mg via INTRAVENOUS
  Filled 2017-07-17: qty 1

## 2017-07-17 MED ORDER — ONDANSETRON HCL 4 MG/2ML IJ SOLN
INTRAMUSCULAR | Status: AC
  Filled 2017-07-17: qty 2

## 2017-07-17 MED ORDER — GABAPENTIN 300 MG PO CAPS
300.0000 mg | ORAL_CAPSULE | Freq: Every day | ORAL | Status: DC
  Administered 2017-07-17 – 2017-07-22 (×6): 300 mg via ORAL
  Filled 2017-07-17 (×6): qty 1

## 2017-07-17 NOTE — Progress Notes (Signed)
PROGRESS NOTE    [name]  ENI:[id number] DOB: 1951-07-19 DOA: 07/16/2017 PCP: Lucretia Kern, DO   Chief Complaint  Patient presents with  . Fall  . Facial Injury  . Epistaxis  . Wrist Pain    right  . Knee Pain    right    Brief Narrative:  HPI On 07/16/2017 by Dr. Karmen Bongo [name] is a 66 y.o. female with medical history significant of RA; pseudogout; HTN; and GERD presenting following a fall.    Patient was carrying laundry down the stairs.  She was about 2-3 steps down and she slipped.  She lost her footing and went headfirst down the stairs.  She slid on her chest and rammed her face into the floor. She injured her left face and right wrist and knee.  Her hip was hurting a bit but that seems to be okay.  She has a h/o swollen LM in her left submandibular neck years ago and it recurred recently.  She also had hypercalcemia; she was placed on vitamin D for vitamin D deficiency.  She saw ENT twice including yesterday and he has placed her on 2 consecutive antibiotics.  Assessment & Plan   Fall with multiple fractures -Patient noted to have maxillary fracture, likely to require surgical intervention -Right Radial fracture noted, currently splinted -Right Tibial plateau fracture, currently an immobilizer -Orthopedics consulted and appreciated -Discuss with Dr. Fredonia Highland, who stated that she possibly have surgical intervention on her wrist on 07/18/2017. May not need surgical intervention on tibial plateau fracture. -Pending PT, OT -Continue pain control -Patient does have nausea with some pain medications, will order antiemetics as needed  Neck mass -Patient follows with Dr. Claria Dice. Possibly reactive lymph node -Continue Ceftin  Hyperparathyroidism -Followed by PCP and endocrinology -Continue vitamin D supplementation -TSH recently checked 1 week ago and was normal  Essential hypertension -Continue Toprol, Cozaar -Lasix held  RA -Continue  Plaquenil, sulfasalazine  DVT Prophylaxis  Heparin  Code Status: Full  Family Communication: Husband at bedside  Disposition Plan: Admitted. Pending surgery  Consultants Orthopedics  Procedures  None  Antibiotics   Anti-infectives (From admission, onward)   Start     Dose/Rate Route Frequency Ordered Stop   07/17/17 1000  hydroxychloroquine (PLAQUENIL) tablet 200 mg    Comments:  Patient is currently taking     200 mg Oral Daily 07/17/17 0628     07/17/17 0800  cefUROXime (CEFTIN) tablet 250 mg     250 mg Oral 2 times daily with meals 07/17/17 0628 07/20/17 0759      Subjective:   [name] seen and examined today.  Complains of pain everywhere. Denies chest pain, shortness of breath, abdominal pain. Does feel some nausea depending on pain medication. Denies dizziness. Does have headache.  Objective:   Vitals:   07/17/17 0300 07/17/17 0359 07/17/17 0538 07/17/17 0651  BP:  (!) 172/106 (!) 177/77 (!) 157/79  Pulse: 89 90 91 90  Resp:  (!) 21  19  Temp:    98.6 F (37 C)  TempSrc:    Oral  SpO2: 94% 93% 94% 96%  Height:    5\' 6"  (1.676 m)   No intake or output data in the 24 hours ending 07/17/17 1506 There were no vitals filed for this visit.  Exam  General: Well developed, well nourished, NAD, appears stated age  HEENT: NCAT, bruising noted on nasal bridge/forehead/periorbital, mucous membranes moist.   Cardiovascular: S1 S2 auscultated, no  rubs, murmurs or gallops. Regular rate and rhythm.  Respiratory: Clear to auscultation bilaterally with equal chest rise  Abdomen: Soft, nontender, nondistended, + bowel sounds  Extremities: warm dry without cyanosis clubbing or edema. RLE in [place], RUE wrapped  Neuro: AAOx3, nonfocal  Psych: Normal affect and demeanor with intact judgement and insight   Data Reviewed: I have personally reviewed following labs and imaging studies  CBC: Recent Labs  Lab 07/17/17 1202  WBC 9.2  HGB 11.3*  HCT 36.0    MCV 75.5*  PLT 332   Basic Metabolic Panel: Recent Labs  Lab 07/17/17 1202  NA 135  K 3.5  CL 102  CO2 27  GLUCOSE 118*  BUN 12  CREATININE 0.86  CALCIUM 10.0   GFR: Estimated Creatinine Clearance: 77.5 mL/min (by C-G formula based on SCr of 0.86 mg/dL). Liver Function Tests: No results for input(s): AST, ALT, ALKPHOS, BILITOT, PROT, ALBUMIN in the last 168 hours. No results for input(s): LIPASE, AMYLASE in the last 168 hours. No results for input(s): AMMONIA in the last 168 hours. Coagulation Profile: No results for input(s): INR, PROTIME in the last 168 hours. Cardiac Enzymes: No results for input(s): CKTOTAL, CKMB, CKMBINDEX, TROPONINI in the last 168 hours. BNP (last 3 results) No results for input(s): PROBNP in the last 8760 hours. HbA1C: No results for input(s): HGBA1C in the last 72 hours. CBG: No results for input(s): GLUCAP in the last 168 hours. Lipid Profile: No results for input(s): CHOL, HDL, LDLCALC, TRIG, CHOLHDL, LDLDIRECT in the last 72 hours. Thyroid Function Tests: No results for input(s): TSH, T4TOTAL, FREET4, T3FREE, THYROIDAB in the last 72 hours. Anemia Panel: No results for input(s): VITAMINB12, FOLATE, FERRITIN, TIBC, IRON, RETICCTPCT in the last 72 hours. Urine analysis:    Component Value Date/Time   COLORURINE YELLOW 04/29/2014 [place] 04/29/2014 1244   LABSPEC 1.010 04/29/2014 1244   PHURINE 5.5 04/29/2014 [place] 04/29/2014 [place] 04/29/2014 1244   BILIRUBINUR NEGATIVE 04/29/2014 1244   KETONESUR NEGATIVE 04/29/2014 1244   PROTEINUR NEGATIVE 03/23/2010 1124   UROBILINOGEN 0.2 04/29/2014 1244   NITRITE NEGATIVE 04/29/2014 1244   LEUKOCYTESUR NEGATIVE 04/29/2014 1244   Sepsis Labs: @LABRCNTIP (procalcitonin:4,lacticidven:4)  )No results found for this or any previous visit (from the past 240 hour(s)).    Radiology Studies: Dg Wrist Complete Right  Result Date:  07/16/2017 CLINICAL DATA:  Right wrist pain post fall. EXAM: RIGHT WRIST - COMPLETE 3+ VIEW COMPARISON:  05/09/2004 FINDINGS: There is a mildly comminuted impacted fracture of the distal radius with no significant displacement. The main horizontal fracture line is located 18 mm proximal to the radiocarpal joint. Additional longitudinal lucency extends to the radiocarpal joint. It is difficult to determine whether this particular lucency represents an intra-articular extension of the acute fracture or incomplete healing of the previous fracture demonstrated in 2005, as it has a strikingly similar appearance. Old posttraumatic changes from avulsion fracture of the ulnar styloid process. IMPRESSION: Mildly comminuted impacted fracture of the distal radius with no significant displacement. It is uncertain whether there is an intra-articular extension to the radiocarpal joint. Electronically Signed   By: Fidela Salisbury M.D.   On: 07/16/2017 18:29   Ct Head Wo Contrast  Result Date: 07/16/2017 CLINICAL DATA:  Fall while carrying laundry downstairs. EXAM: CT HEAD WITHOUT CONTRAST CT CERVICAL SPINE WITHOUT CONTRAST TECHNIQUE: Multidetector CT imaging of the head and cervical spine was performed following the standard protocol without intravenous  contrast. Multiplanar CT image reconstructions of the cervical spine were also generated. COMPARISON:  None. FINDINGS: CT HEAD FINDINGS Brain: Mild generalized atrophy. Normal for age minimal chronic small vessel ischemia. No intracranial hemorrhage, mass effect, or midline shift. No hydrocephalus. The basilar cisterns are patent. No evidence of territorial infarct or acute ischemia. No extra-axial or intracranial fluid collection. Vascular: Atherosclerosis of skullbase vasculature without hyperdense vessel or abnormal calcification. Skull: No fracture or focal lesion. Sinuses/Orbits: Minimally displaced fractures through the anterior and lateral walls the left maxillary  sinus with associated hemosinus. Mucosal thickening of left ethmoid air cells. Mastoid air cells are clear. Bilateral cataract resection. Other: Small right frontal scalp hematoma. CT CERVICAL SPINE FINDINGS Alignment: Normal. Skull base and vertebrae: No acute fracture. Vertebral body heights are maintained. The dens and skull base are intact. Soft tissues and spinal canal: No prevertebral fluid or swelling. No visible canal hematoma. Disc levels: Disc space narrowing and endplate spurring at Z3-Y8 and C6-C7. Scattered multilevel facet arthropathy. Upper chest: No acute abnormality. Enlarged right lobe with these thyroid gland containing small nodules, similar to prior neck CT 06/06/2015. Other: None. IMPRESSION: 1.  No acute intracranial abnormality. 2. Acute minimally displaced fracture anterior and lateral walls of the left maxillary sinus. 3. No fracture or subluxation of the cervical spine. Electronically Signed   By: Jeb Levering M.D.   On: 07/16/2017 21:10   Ct Cervical Spine Wo Contrast  Result Date: 07/16/2017 CLINICAL DATA:  Fall while carrying laundry downstairs. EXAM: CT HEAD WITHOUT CONTRAST CT CERVICAL SPINE WITHOUT CONTRAST TECHNIQUE: Multidetector CT imaging of the head and cervical spine was performed following the standard protocol without intravenous contrast. Multiplanar CT image reconstructions of the cervical spine were also generated. COMPARISON:  None. FINDINGS: CT HEAD FINDINGS Brain: Mild generalized atrophy. Normal for age minimal chronic small vessel ischemia. No intracranial hemorrhage, mass effect, or midline shift. No hydrocephalus. The basilar cisterns are patent. No evidence of territorial infarct or acute ischemia. No extra-axial or intracranial fluid collection. Vascular: Atherosclerosis of skullbase vasculature without hyperdense vessel or abnormal calcification. Skull: No fracture or focal lesion. Sinuses/Orbits: Minimally displaced fractures through the anterior and  lateral walls the left maxillary sinus with associated hemosinus. Mucosal thickening of left ethmoid air cells. Mastoid air cells are clear. Bilateral cataract resection. Other: Small right frontal scalp hematoma. CT CERVICAL SPINE FINDINGS Alignment: Normal. Skull base and vertebrae: No acute fracture. Vertebral body heights are maintained. The dens and skull base are intact. Soft tissues and spinal canal: No prevertebral fluid or swelling. No visible canal hematoma. Disc levels: Disc space narrowing and endplate spurring at M5-H8 and C6-C7. Scattered multilevel facet arthropathy. Upper chest: No acute abnormality. Enlarged right lobe with these thyroid gland containing small nodules, similar to prior neck CT 06/06/2015. Other: None. IMPRESSION: 1.  No acute intracranial abnormality. 2. Acute minimally displaced fracture anterior and lateral walls of the left maxillary sinus. 3. No fracture or subluxation of the cervical spine. Electronically Signed   By: Jeb Levering M.D.   On: 07/16/2017 21:10   Ct Knee Right Wo Contrast  Result Date: 07/16/2017 CLINICAL DATA:  Acute tibial plateau fracture, right knee pain. Fall carrying [employer]. EXAM: CT OF THE RIGHT KNEE WITHOUT CONTRAST TECHNIQUE: Multidetector CT imaging of the RIGHT knee was performed according to the standard protocol. Multiplanar CT image reconstructions were also generated. COMPARISON:  Radiograph earlier this day FINDINGS: Bones/Joint/Cartilage Acute comminuted lateral tibial plateau fracture. Mild displacement involving the anterior-most cortex. Only minimal  articular surface depression, less than 1 mm. Fracture extends to the tibial spines posteriorly. No vertically-oriented component. No metaphyseal component. Moderate lipohemarthrosis. No additional fracture of the patella, distal femur, or proximal fibula. Medial and lateral tibiofemoral chondrocalcinosis incidentally noted. Ligaments Suboptimally assessed by CT.  Possible ACL  thickening. Muscles and Tendons No intramuscular hematoma. Quadriceps and patellar tendons are intact. Soft tissues Soft tissue edema about the knee. IMPRESSION: Schatzker type 3 minimally depressed lateral tibial plateau fracture. Electronically Signed   By: Jeb Levering M.D.   On: 07/16/2017 21:22   Chest Portable 1 View  Result Date: 07/17/2017 CLINICAL DATA:  66 year old female with intermittent cough. Preoperative study. EXAM: PORTABLE CHEST 1 VIEW COMPARISON:  06/20/2011. FINDINGS: Portable AP semi upright view at 1339 hours. Lower lung volumes. Mediastinal contours are stable. Cardiac size at the upper limits of normal. Visualized tracheal air column is within normal limits. Allowing for portable technique the lungs are clear. No pneumothorax or pleural effusion. Partially calcified left breast implant. IMPRESSION: No acute cardiopulmonary abnormality. Electronically Signed   By: Genevie Ann M.D.   On: 07/17/2017 14:20   Dg Knee Complete 4 Views Right  Result Date: 07/16/2017 CLINICAL DATA:  Recent fall downstairs, generalized right knee pain EXAM: RIGHT KNEE - COMPLETE 4+ VIEW COMPARISON:  None available FINDINGS: Mild degenerative changes noted with chondrocalcinosis. Slight osteopenia. Joint effusion noted on the lateral view. Acute minimally depressed lateral tibial plateau fracture noted on the oblique views. IMPRESSION: Acute minimally depressed lateral tibial plateau fracture and likely an associated right knee effusion/ hemarthrosis. Right knee degenerative change with chondrocalcinosis. Electronically Signed   By: Jerilynn Mages.  Shick M.D.   On: 07/16/2017 18:17     Scheduled Meds: . cefUROXime  250 mg Oral BID WC  . cholecalciferol  5,000 Units Oral Daily  . docusate sodium  100 mg Oral BID  . gabapentin  200 mg Oral Daily  . gabapentin  300 mg Oral QHS  . heparin  5,000 Units Subcutaneous Q8H  . hydroxychloroquine  200 mg Oral Daily  . losartan  100 mg Oral Daily  . metoprolol succinate   50 mg Oral BID  . sulfaSALAzine  1,000 mg Oral BID WC   Continuous Infusions:   LOS: 1 day   Time Spent in minutes   30 minutes  Axcel Horsch D.O. on 07/17/2017 at 3:06 PM  Between 7am to 7pm - Pager - [phone]  After 7pm go to [email or web address] - password [email or web address]  And look for the night coverage person covering for me after hours  [facility] Office  [phone]

## 2017-07-17 NOTE — ED Notes (Signed)
[facility] called for transport. 

## 2017-07-17 NOTE — Consult Note (Signed)
ORTHOPAEDIC CONSULTATION  REQUESTING PHYSICIAN: Cristal Ford, DO  Chief Complaint: Fall down stairs  Assessment / Plan: Principal Problem:   Fall at home, initial encounter Active Problems:   Rheumatoid arthritis ([place])   Hypertension   Neck mass   Hyperparathyroidism ([place])   Tibial plateau fracture, right, closed, initial encounter   Left maxillary fracture ([place])   Closed right radial fracture  Right lateral tibial plateau fracture, closed  Mild displacement and depression.  Dr. Percell Miller will further evaluate CT images-this may be amenable to nonoperative management.  Surgery is recommended, this would likely be delayed until next Tuesday to allow for swelling to subside.  Details, risks, benefits of ORIF along with the pre-and postoperative course were discussed at length with the patient and her husband.  They verbalized understanding.  Right distal radius fracture: Likely amenable to nonoperative management. Nonweightbearing.  Knee immobilizer at all times when mobilizing. Maintain splint right wrist Apply ice and elevate. Apply Ace wrap for compression right knee Mobilize with therapy-the patient would prefer discharged home if mobilizing safely.  Likely will need a platform walker. Okay for diet from an orthopedic perspective  Weight Bearing Status: Non Weight Bearing (NWB) Right Wrist - okay to wb through elbow / platform walker NWB RLE.   VTE px: SCD's and Heparin.    HPI: [name] is a 66 y.o. female who complains of facial, right wrist, right knee pain after a fall down her stairs when doing laundry yesterday afternoon.  She denies dizziness or loss of consciousness.  She presented to the emergency department where images showed a right distal radius and right lateral tibial plateau fractures.  Orthopedics was consulted for evaluation.  Today her pain is controlled and most significant in her face.  Denies history of MI, CVA, DVT, PE.   Previously ambulatory without aid.  The patient was living in a multiple story home, but is able to reside on the lower level.    Past Medical History:  Diagnosis Date  . Allergy   . Cataract    BILATERAL-REMOVED 2 YEARS AGO  . GERD (gastroesophageal reflux disease)   . Hx of colonic polyp - ssp 11/03/2014  . Hypercalcemia   . Hypertension   . 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 Osteopenia 10/17/2011   DEXA 09/2007: -1.4 L fem; 10/2011: -1.2 L fem   . PONV (postoperative nausea and vomiting)   . Pseudogout of foot   . Rheumatoid arthritis(714.0) dx 2010   Past Surgical History:  Procedure Laterality Date  . BREAST BIOPSY  1972  . Broken wrist  2010  . CATARACT EXTRACTION  03/2012   left  . COLONOSCOPY    . COSMETIC SURGERY    . FRACTURE SURGERY    . INNER EAR SURGERY     busted ear drum  . MAXIMUM ACCESS (MAS)POSTERIOR LUMBAR INTERBODY FUSION (PLIF) 1 LEVEL N/A 10/11/2016   Procedure: Lumbar one-Sacral one Maximum access posterior lumbar interbody fusion;  Surgeon: Erline Levine, MD;  Location: [facility];  Service: Neurosurgery;  Laterality: N/A;  . pneumonia  2007   Social History   Socioeconomic History  . Marital status: Married    Spouse name: None  . Number of children: 3  . Years of education: None  . Highest education level: None  Social Needs  . Financial resource strain: None  . Food insecurity - worry: None  . Food insecurity - inability: None  . Transportation needs - medical: None  . Transportation  needs - non-medical: None  Occupational History  . Occupation: Retired  Tobacco Use  . Smoking status: Former Smoker    Packs/day: 4.00    Years: 4.00    Pack years: 16.00    Types: Cigarettes    Last attempt to quit: 1985    Years since quitting: 33.9  . Smokeless tobacco: Former Systems developer    Quit date: 08/19/1981  Substance and Sexual Activity  . Alcohol use: No    Alcohol/week: 0.0 oz  . Drug use: No  . Sexual activity: None  Other Topics Concern  . None    Social History Narrative   Artist -retired Building control surveyor   Married, lives with spouse, [name], he is IT support for [employer]   3 sons   2 caffeinated beverages a day   No regular exercise, diet is ok   Family History  Problem Relation Age of Onset  . Stroke Mother   . Hypertension Father   . Heart attack Father   . Hypertension Sister        x 3  . Hypertension Brother   . Hyperthyroidism Sister        x2, s/p RAI ablation  . Hypothyroidism Brother    Allergies  Allergen Reactions  . Amlodipine Besylate     Tremors  . Nortriptyline Other (See Comments)    tremors   Prior to Admission medications   Medication Sig Start Date End Date Taking? Authorizing Provider  aspirin 81 MG tablet Take 81 mg by mouth at bedtime.    Yes [provider]  cefUROXime (CEFTIN) 250 MG tablet Take 250 mg by mouth 2 (two) times daily with a meal.  07/15/17  Yes [provider]  Cholecalciferol (VITAMIN D-3) 5000 units TABS Take 1 tablet by mouth daily.   Yes [provider]  furosemide (LASIX) 20 MG tablet TAKE 1 TABLET BY MOUTH ONCE DAILY 04/10/17  Yes Colin Benton R, DO  gabapentin (NEURONTIN) 100 MG capsule TAKE 2 CAPSULES BY MOUTH TWICE DAILY Patient taking differently: TAKE 2 CAPSULES BY MOUTH DAILY 04/03/17  Yes Colin Benton R, DO  gabapentin (NEURONTIN) 300 MG capsule Take 1 capsule (300 mg total) by mouth at bedtime. 06/13/17  Yes Patel, Donika K, DO  guaiFENesin (MUCINEX) 600 MG 12 hr tablet Take 600 mg by mouth 2 (two) times daily as needed for cough or to loosen phlegm.    Yes [provider]  hydroxychloroquine (PLAQUENIL) 200 MG tablet Take 200 mg by mouth daily. Patient is currently taking   Yes [provider]  losartan (COZAAR) 100 MG tablet Take 1 tablet (100 mg total) by mouth daily. 12/12/16  Yes Colin Benton R, DO  metoprolol succinate (TOPROL-XL) 50 MG 24 hr tablet Take 2 tablets (100 mg total) by mouth daily. Take with  or immediately following a meal. Patient taking differently: Take 50 mg by mouth 2 (two) times daily. Take with or immediately following a meal. 02/13/17  Yes Colin Benton R, DO  ranitidine (ZANTAC) 150 MG tablet Take 150 mg by mouth daily as needed for heartburn.    Yes [provider]  sulfaSALAzine (AZULFIDINE) 500 MG EC tablet TAKE 2 TABLETS BY MOUTH TWICE DAILY 07/17/16  Yes Deveshwar, Abel Presto, MD  tiZANidine (ZANAFLEX) 4 MG tablet Take 0.5-1 tablets (2-4 mg total) by mouth every 6 (six) hours as needed for muscle spasms. 10/14/16  Yes Erline Levine, MD  Vit B6-Vit B12-Omega 3 Acids (VITAMIN B PLUS+ PO) Take  1 tablet by mouth every evening.   Yes [provider]   Dg Wrist Complete Right  Result Date: 07/16/2017 CLINICAL DATA:  Right wrist pain post fall. EXAM: RIGHT WRIST - COMPLETE 3+ VIEW COMPARISON:  05/09/2004 FINDINGS: There is a mildly comminuted impacted fracture of the distal radius with no significant displacement. The main horizontal fracture line is located 18 mm proximal to the radiocarpal joint. Additional longitudinal lucency extends to the radiocarpal joint. It is difficult to determine whether this particular lucency represents an intra-articular extension of the acute fracture or incomplete healing of the previous fracture demonstrated in 2005, as it has a strikingly similar appearance. Old posttraumatic changes from avulsion fracture of the ulnar styloid process. IMPRESSION: Mildly comminuted impacted fracture of the distal radius with no significant displacement. It is uncertain whether there is an intra-articular extension to the radiocarpal joint. Electronically Signed   By: Fidela Salisbury M.D.   On: 07/16/2017 18:29   Ct Head Wo Contrast  Result Date: 07/16/2017 CLINICAL DATA:  Fall while carrying laundry downstairs. EXAM: CT HEAD WITHOUT CONTRAST CT CERVICAL SPINE WITHOUT CONTRAST TECHNIQUE: Multidetector CT imaging of the head and cervical spine was  performed following the standard protocol without intravenous contrast. Multiplanar CT image reconstructions of the cervical spine were also generated. COMPARISON:  None. FINDINGS: CT HEAD FINDINGS Brain: Mild generalized atrophy. Normal for age minimal chronic small vessel ischemia. No intracranial hemorrhage, mass effect, or midline shift. No hydrocephalus. The basilar cisterns are patent. No evidence of territorial infarct or acute ischemia. No extra-axial or intracranial fluid collection. Vascular: Atherosclerosis of skullbase vasculature without hyperdense vessel or abnormal calcification. Skull: No fracture or focal lesion. Sinuses/Orbits: Minimally displaced fractures through the anterior and lateral walls the left maxillary sinus with associated hemosinus. Mucosal thickening of left ethmoid air cells. Mastoid air cells are clear. Bilateral cataract resection. Other: Small right frontal scalp hematoma. CT CERVICAL SPINE FINDINGS Alignment: Normal. Skull base and vertebrae: No acute fracture. Vertebral body heights are maintained. The dens and skull base are intact. Soft tissues and spinal canal: No prevertebral fluid or swelling. No visible canal hematoma. Disc levels: Disc space narrowing and endplate spurring at B3-Z3 and C6-C7. Scattered multilevel facet arthropathy. Upper chest: No acute abnormality. Enlarged right lobe with these thyroid gland containing small nodules, similar to prior neck CT 06/06/2015. Other: None. IMPRESSION: 1.  No acute intracranial abnormality. 2. Acute minimally displaced fracture anterior and lateral walls of the left maxillary sinus. 3. No fracture or subluxation of the cervical spine. Electronically Signed   By: Jeb Levering M.D.   On: 07/16/2017 21:10   Ct Cervical Spine Wo Contrast  Result Date: 07/16/2017 CLINICAL DATA:  Fall while carrying laundry downstairs. EXAM: CT HEAD WITHOUT CONTRAST CT CERVICAL SPINE WITHOUT CONTRAST TECHNIQUE: Multidetector CT imaging of  the head and cervical spine was performed following the standard protocol without intravenous contrast. Multiplanar CT image reconstructions of the cervical spine were also generated. COMPARISON:  None. FINDINGS: CT HEAD FINDINGS Brain: Mild generalized atrophy. Normal for age minimal chronic small vessel ischemia. No intracranial hemorrhage, mass effect, or midline shift. No hydrocephalus. The basilar cisterns are patent. No evidence of territorial infarct or acute ischemia. No extra-axial or intracranial fluid collection. Vascular: Atherosclerosis of skullbase vasculature without hyperdense vessel or abnormal calcification. Skull: No fracture or focal lesion. Sinuses/Orbits: Minimally displaced fractures through the anterior and lateral walls the left maxillary sinus with associated hemosinus. Mucosal thickening of left ethmoid air cells. Mastoid air cells are  clear. Bilateral cataract resection. Other: Small right frontal scalp hematoma. CT CERVICAL SPINE FINDINGS Alignment: Normal. Skull base and vertebrae: No acute fracture. Vertebral body heights are maintained. The dens and skull base are intact. Soft tissues and spinal canal: No prevertebral fluid or swelling. No visible canal hematoma. Disc levels: Disc space narrowing and endplate spurring at H2-Z2 and C6-C7. Scattered multilevel facet arthropathy. Upper chest: No acute abnormality. Enlarged right lobe with these thyroid gland containing small nodules, similar to prior neck CT 06/06/2015. Other: None. IMPRESSION: 1.  No acute intracranial abnormality. 2. Acute minimally displaced fracture anterior and lateral walls of the left maxillary sinus. 3. No fracture or subluxation of the cervical spine. Electronically Signed   By: Jeb Levering M.D.   On: 07/16/2017 21:10   Ct Knee Right Wo Contrast  Result Date: 07/16/2017 CLINICAL DATA:  Acute tibial plateau fracture, right knee pain. Fall carrying [employer]. EXAM: CT OF THE RIGHT KNEE WITHOUT  CONTRAST TECHNIQUE: Multidetector CT imaging of the RIGHT knee was performed according to the standard protocol. Multiplanar CT image reconstructions were also generated. COMPARISON:  Radiograph earlier this day FINDINGS: Bones/Joint/Cartilage Acute comminuted lateral tibial plateau fracture. Mild displacement involving the anterior-most cortex. Only minimal articular surface depression, less than 1 mm. Fracture extends to the tibial spines posteriorly. No vertically-oriented component. No metaphyseal component. Moderate lipohemarthrosis. No additional fracture of the patella, distal femur, or proximal fibula. Medial and lateral tibiofemoral chondrocalcinosis incidentally noted. Ligaments Suboptimally assessed by CT.  Possible ACL thickening. Muscles and Tendons No intramuscular hematoma. Quadriceps and patellar tendons are intact. Soft tissues Soft tissue edema about the knee. IMPRESSION: Schatzker type 3 minimally depressed lateral tibial plateau fracture. Electronically Signed   By: Jeb Levering M.D.   On: 07/16/2017 21:22   Dg Knee Complete 4 Views Right  Result Date: 07/16/2017 CLINICAL DATA:  Recent fall downstairs, generalized right knee pain EXAM: RIGHT KNEE - COMPLETE 4+ VIEW COMPARISON:  None available FINDINGS: Mild degenerative changes noted with chondrocalcinosis. Slight osteopenia. Joint effusion noted on the lateral view. Acute minimally depressed lateral tibial plateau fracture noted on the oblique views. IMPRESSION: Acute minimally depressed lateral tibial plateau fracture and likely an associated right knee effusion/ hemarthrosis. Right knee degenerative change with chondrocalcinosis. Electronically Signed   By: Jerilynn Mages.  Shick M.D.   On: 07/16/2017 18:17    Positive ROS: All other systems have been reviewed and were otherwise negative with the exception of those mentioned in the HPI and as above.  Objective: Labs cbc No results for input(s): WBC, HGB, HCT, PLT in the last 72  hours.  Labs inflam No results for input(s): CRP in the last 72 hours.  Invalid input(s): ESR  Labs coag No results for input(s): INR, PTT in the last 72 hours.  Invalid input(s): PT  No results for input(s): NA, K, CL, CO2, GLUCOSE, BUN, CREATININE, CALCIUM in the last 72 hours.  Physical Exam: Vitals:   07/17/17 0538 07/17/17 0651  BP: (!) 177/77 (!) 157/79  Pulse: 91 90  Resp:  19  Temp:  98.6 F (37 C)  SpO2: 94% 96%   General: Alert, no acute distress Mental status: Alert and Oriented x3 Neurologic: Speech Clear and organized, no gross focal findings or movement disorder appreciated. Respiratory: No cyanosis, no use of accessory musculature Cardiovascular: No pedal edema GI: Abdomen is soft and non-tender, non-distended. Skin: Warm and dry.  No lesions in the area of chief complaint . Extremities: Warm and well perfused w/o edema Psychiatric:  Patient is competent for consent with normal mood and affect  MUSCULOSKELETAL:  RUE: Sugar tong splint in place and in good condition.  Hand warm with good capillary refill.  Wiggles fingers freely.  Sensation intact. RLE: Right knee with moderate effusion.  No ecchymosis or lesion.  Minimal tenderness to palpation.  Skin does wrinkle.  Lower leg compressible.  No pain with active or passive range of motion at the ankle or toes.  EHL, FHL, dorsiflexion, plantarflexion intact.  Bilateral foot swelling at baseline per patient.  Other extremities are atraumatic with painless ROM and NVI.   Prudencio Burly III PA-C 07/17/2017 7:20 AM

## 2017-07-17 NOTE — Evaluation (Signed)
Physical Therapy Evaluation Patient Details Name: [name] MRN: [id number] DOB: 05/20/1951 Today's Date: 07/17/2017   History of Present Illness  Pt is a 66 y/o female who presents s/p fall down the stairs at home. Pt sustained a R tibial plateau fracture and a R distal radius fracture. She is NWB through the R wrist but is Ok to bear weight through the elbow, and NWB on the RLE. PMH significant for L1-S1 PLIF 09/2016.  Clinical Impression  Pt admitted with above diagnosis. Pt currently with functional limitations due to the deficits listed below (see PT Problem List). At the time of PT eval pt was able to perform transfers with +2 max assist for advancement of RLE and trunk support. Pt refused to attempt OOB mobility at this time due to nausea and pain. Pt was repositioned back in bed at end of session. Pt and husband adamant about return home at d/c. Will continue to follow acutely however will also reassess if/when surgery occurs (tentatively planned for Tuesday). If pt does not make significant progress with therapies, may want to consider a higher level of care at d/c. Pt will benefit from skilled PT to increase their independence and safety with mobility to allow discharge to the venue listed below.       Follow Up Recommendations Home health PT;Supervision/Assistance - 24 hour    Equipment Recommendations  Wheelchair (measurements PT);Wheelchair cushion (measurements PT)(R platform RW (pt 5'6"))    Recommendations for Other Services       Precautions / Restrictions Precautions Precautions: Fall Required Braces or Orthoses: Knee Immobilizer - Right Knee Immobilizer - Right: On when out of bed or walking Restrictions Weight Bearing Restrictions: Yes RUE Weight Bearing: Weight bear through elbow only RLE Weight Bearing: Non weight bearing Other Position/Activity Restrictions: Must have KI donned for mobility      Mobility  Bed Mobility Overal bed mobility: Needs  Assistance Bed Mobility: Supine to Sit;Sit to Supine     Supine to sit: Max assist;+2 for physical assistance Sit to supine: Max assist;+2 for physical assistance   General bed mobility comments: +2 assist for all aspects of bed mobility. Pt required assist to advance RLE as well as elevate trunk to full sitting position. Bed pad used to complete scooting activity to EOB. To return to supine, pt required +2 assist for LE elevation and trunk support, as well as scooting towards HOB.   Transfers                 General transfer comment: Pt refused any OOB mobility at this time due to pain and nausea.   Ambulation/Gait                Stairs            Wheelchair Mobility    Modified Rankin (Stroke Patients Only)       Balance Overall balance assessment: Needs assistance Sitting-balance support: Feet supported;Bilateral upper extremity supported Sitting balance-Leahy Scale: Poor Sitting balance - Comments: BUE support at all times but no outside support was required to maintain sitting balance.                                      Pertinent Vitals/Pain Pain Assessment: 0-10 Pain Score: 5  Pain Location: knee, wrist Pain Descriptors / Indicators: Discomfort;Aching Pain Intervention(s): Limited activity within patient's tolerance;Monitored during session;Repositioned    Home Living Family/patient  expects to be discharged to:: Private residence Living Arrangements: Spouse/significant other;Children Available Help at Discharge: Family;Available 24 hours/day Type of Home: House Home Access: Level entry     Home Layout: Two level;1/2 bath on main level Home Equipment: Walker - 2 wheels      Prior Function Level of Independence: Independent               Hand Dominance   Dominant Hand: Right    Extremity/Trunk Assessment   Upper Extremity Assessment Upper Extremity Assessment: Defer to OT evaluation;RUE deficits/detail RUE  Deficits / Details: NWB through wrist in splint    Lower Extremity Assessment Lower Extremity Assessment: Generalized weakness;RLE deficits/detail RLE Deficits / Details: Decreased strength and AROM consistent with above mentioned injury.  RLE: Unable to fully assess due to pain;Unable to fully assess due to immobilization    Cervical / Trunk Assessment Cervical / Trunk Assessment: Normal  Communication   Communication: No difficulties  Cognition Arousal/Alertness: Lethargic Behavior During Therapy: Flat affect Overall Cognitive Status: Within Functional Limits for tasks assessed                                        General Comments      Exercises     Assessment/Plan    PT Assessment Patient needs continued PT services  PT Problem List Decreased strength;Decreased range of motion;Decreased activity tolerance;Decreased balance;Decreased mobility;Decreased knowledge of use of DME;Decreased safety awareness;Decreased knowledge of precautions;Pain       PT Treatment Interventions DME instruction;Gait training;Stair training;Functional mobility training;Therapeutic activities;Therapeutic exercise;Neuromuscular re-education;Patient/family education;Wheelchair mobility training    PT Goals (Current goals can be found in the Care Plan section)  Acute Rehab PT Goals Patient Stated Goal: Feel better PT Goal Formulation: With patient Time For Goal Achievement: 07/24/17 Potential to Achieve Goals: Good    Frequency Min 5X/week   Barriers to discharge        Co-evaluation               AM-PAC PT "6 Clicks" Daily Activity  Outcome Measure Difficulty turning over in bed (including adjusting bedclothes, sheets and blankets)?: Unable Difficulty moving from lying on back to sitting on the side of the bed? : Unable Difficulty sitting down on and standing up from a chair with arms (e.g., wheelchair, bedside commode, etc,.)?: Unable Help needed moving to and  from a bed to chair (including a wheelchair)?: Total Help needed walking in hospital room?: Total Help needed climbing 3-5 steps with a railing? : Total 6 Click Score: 6    End of Session   Activity Tolerance: Patient limited by pain Patient left: in bed;with call bell/phone within reach;with bed alarm set Nurse Communication: Mobility status PT Visit Diagnosis: Unsteadiness on feet (R26.81);History of falling (Z91.81);Difficulty in walking, not elsewhere classified (R26.2);Pain Pain - Right/Left: Right Pain - part of body: Arm;Leg    Time: 1212-1236 PT Time Calculation (min) (ACUTE ONLY): 24 min   Charges:   PT Evaluation $PT Eval Moderate Complexity: 1 Mod PT Treatments $Therapeutic Activity: 8-22 mins   PT G Codes:        Rolinda Roan, PT, DPT Acute Rehabilitation Services Pager: [phone]   Thelma Comp 07/17/2017, 1:41 PM

## 2017-07-17 NOTE — H&P (View-Only) (Signed)
ORTHOPAEDIC CONSULTATION  REQUESTING PHYSICIAN: Cristal Ford, DO  Chief Complaint: Fall down stairs  Assessment / Plan: Principal Problem:   Fall at home, initial encounter Active Problems:   Rheumatoid arthritis ([place])   Hypertension   Neck mass   Hyperparathyroidism ([place])   Tibial plateau fracture, right, closed, initial encounter   Left maxillary fracture ([place])   Closed right radial fracture  Right lateral tibial plateau fracture, closed  Mild displacement and depression.  Dr. Percell Miller will further evaluate CT images-this may be amenable to nonoperative management.  Surgery is recommended, this would likely be delayed until next Tuesday to allow for swelling to subside.  Details, risks, benefits of ORIF along with the pre-and postoperative course were discussed at length with the patient and her husband.  They verbalized understanding.  Right distal radius fracture: Likely amenable to nonoperative management. Nonweightbearing.  Knee immobilizer at all times when mobilizing. Maintain splint right wrist Apply ice and elevate. Apply Ace wrap for compression right knee Mobilize with therapy-the patient would prefer discharged home if mobilizing safely.  Likely will need a platform walker. Okay for diet from an orthopedic perspective  Weight Bearing Status: Non Weight Bearing (NWB) Right Wrist - okay to wb through elbow / platform walker NWB RLE.   VTE px: SCD's and Heparin.    HPI: [name] is a 66 y.o. female who complains of facial, right wrist, right knee pain after a fall down her stairs when doing laundry yesterday afternoon.  She denies dizziness or loss of consciousness.  She presented to the emergency department where images showed a right distal radius and right lateral tibial plateau fractures.  Orthopedics was consulted for evaluation.  Today her pain is controlled and most significant in her face.  Denies history of MI, CVA, DVT, PE.   Previously ambulatory without aid.  The patient was living in a multiple story home, but is able to reside on the lower level.    Past Medical History:  Diagnosis Date  . Allergy   . Cataract    BILATERAL-REMOVED 2 YEARS AGO  . GERD (gastroesophageal reflux disease)   . Hx of colonic polyp - ssp 11/03/2014  . Hypercalcemia   . Hypertension   . 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 Osteopenia 10/17/2011   DEXA 09/2007: -1.4 L fem; 10/2011: -1.2 L fem   . PONV (postoperative nausea and vomiting)   . Pseudogout of foot   . Rheumatoid arthritis(714.0) dx 2010   Past Surgical History:  Procedure Laterality Date  . BREAST BIOPSY  1972  . Broken wrist  2010  . CATARACT EXTRACTION  03/2012   left  . COLONOSCOPY    . COSMETIC SURGERY    . FRACTURE SURGERY    . INNER EAR SURGERY     busted ear drum  . MAXIMUM ACCESS (MAS)POSTERIOR LUMBAR INTERBODY FUSION (PLIF) 1 LEVEL N/A 10/11/2016   Procedure: Lumbar one-Sacral one Maximum access posterior lumbar interbody fusion;  Surgeon: Erline Levine, MD;  Location: [facility];  Service: Neurosurgery;  Laterality: N/A;  . pneumonia  2007   Social History   Socioeconomic History  . Marital status: Married    Spouse name: None  . Number of children: 3  . Years of education: None  . Highest education level: None  Social Needs  . Financial resource strain: None  . Food insecurity - worry: None  . Food insecurity - inability: None  . Transportation needs - medical: None  . Transportation  needs - non-medical: None  Occupational History  . Occupation: Retired  Tobacco Use  . Smoking status: Former Smoker    Packs/day: 4.00    Years: 4.00    Pack years: 16.00    Types: Cigarettes    Last attempt to quit: 1985    Years since quitting: 33.9  . Smokeless tobacco: Former Systems developer    Quit date: 08/19/1981  Substance and Sexual Activity  . Alcohol use: No    Alcohol/week: 0.0 oz  . Drug use: No  . Sexual activity: None  Other Topics Concern  . None    Social History Narrative   Artist -retired Building control surveyor   Married, lives with spouse, [name], he is IT support for [employer]   3 sons   2 caffeinated beverages a day   No regular exercise, diet is ok   Family History  Problem Relation Age of Onset  . Stroke Mother   . Hypertension Father   . Heart attack Father   . Hypertension Sister        x 3  . Hypertension Brother   . Hyperthyroidism Sister        x2, s/p RAI ablation  . Hypothyroidism Brother    Allergies  Allergen Reactions  . Amlodipine Besylate     Tremors  . Nortriptyline Other (See Comments)    tremors   Prior to Admission medications   Medication Sig Start Date End Date Taking? Authorizing Provider  aspirin 81 MG tablet Take 81 mg by mouth at bedtime.    Yes [provider]  cefUROXime (CEFTIN) 250 MG tablet Take 250 mg by mouth 2 (two) times daily with a meal.  07/15/17  Yes [provider]  Cholecalciferol (VITAMIN D-3) 5000 units TABS Take 1 tablet by mouth daily.   Yes [provider]  furosemide (LASIX) 20 MG tablet TAKE 1 TABLET BY MOUTH ONCE DAILY 04/10/17  Yes Colin Benton R, DO  gabapentin (NEURONTIN) 100 MG capsule TAKE 2 CAPSULES BY MOUTH TWICE DAILY Patient taking differently: TAKE 2 CAPSULES BY MOUTH DAILY 04/03/17  Yes Colin Benton R, DO  gabapentin (NEURONTIN) 300 MG capsule Take 1 capsule (300 mg total) by mouth at bedtime. 06/13/17  Yes Patel, Donika K, DO  guaiFENesin (MUCINEX) 600 MG 12 hr tablet Take 600 mg by mouth 2 (two) times daily as needed for cough or to loosen phlegm.    Yes [provider]  hydroxychloroquine (PLAQUENIL) 200 MG tablet Take 200 mg by mouth daily. Patient is currently taking   Yes [provider]  losartan (COZAAR) 100 MG tablet Take 1 tablet (100 mg total) by mouth daily. 12/12/16  Yes Colin Benton R, DO  metoprolol succinate (TOPROL-XL) 50 MG 24 hr tablet Take 2 tablets (100 mg total) by mouth daily. Take with  or immediately following a meal. Patient taking differently: Take 50 mg by mouth 2 (two) times daily. Take with or immediately following a meal. 02/13/17  Yes Colin Benton R, DO  ranitidine (ZANTAC) 150 MG tablet Take 150 mg by mouth daily as needed for heartburn.    Yes [provider]  sulfaSALAzine (AZULFIDINE) 500 MG EC tablet TAKE 2 TABLETS BY MOUTH TWICE DAILY 07/17/16  Yes Deveshwar, Abel Presto, MD  tiZANidine (ZANAFLEX) 4 MG tablet Take 0.5-1 tablets (2-4 mg total) by mouth every 6 (six) hours as needed for muscle spasms. 10/14/16  Yes Erline Levine, MD  Vit B6-Vit B12-Omega 3 Acids (VITAMIN B PLUS+ PO) Take  1 tablet by mouth every evening.   Yes [provider]   Dg Wrist Complete Right  Result Date: 07/16/2017 CLINICAL DATA:  Right wrist pain post fall. EXAM: RIGHT WRIST - COMPLETE 3+ VIEW COMPARISON:  05/09/2004 FINDINGS: There is a mildly comminuted impacted fracture of the distal radius with no significant displacement. The main horizontal fracture line is located 18 mm proximal to the radiocarpal joint. Additional longitudinal lucency extends to the radiocarpal joint. It is difficult to determine whether this particular lucency represents an intra-articular extension of the acute fracture or incomplete healing of the previous fracture demonstrated in 2005, as it has a strikingly similar appearance. Old posttraumatic changes from avulsion fracture of the ulnar styloid process. IMPRESSION: Mildly comminuted impacted fracture of the distal radius with no significant displacement. It is uncertain whether there is an intra-articular extension to the radiocarpal joint. Electronically Signed   By: Fidela Salisbury M.D.   On: 07/16/2017 18:29   Ct Head Wo Contrast  Result Date: 07/16/2017 CLINICAL DATA:  Fall while carrying laundry downstairs. EXAM: CT HEAD WITHOUT CONTRAST CT CERVICAL SPINE WITHOUT CONTRAST TECHNIQUE: Multidetector CT imaging of the head and cervical spine was  performed following the standard protocol without intravenous contrast. Multiplanar CT image reconstructions of the cervical spine were also generated. COMPARISON:  None. FINDINGS: CT HEAD FINDINGS Brain: Mild generalized atrophy. Normal for age minimal chronic small vessel ischemia. No intracranial hemorrhage, mass effect, or midline shift. No hydrocephalus. The basilar cisterns are patent. No evidence of territorial infarct or acute ischemia. No extra-axial or intracranial fluid collection. Vascular: Atherosclerosis of skullbase vasculature without hyperdense vessel or abnormal calcification. Skull: No fracture or focal lesion. Sinuses/Orbits: Minimally displaced fractures through the anterior and lateral walls the left maxillary sinus with associated hemosinus. Mucosal thickening of left ethmoid air cells. Mastoid air cells are clear. Bilateral cataract resection. Other: Small right frontal scalp hematoma. CT CERVICAL SPINE FINDINGS Alignment: Normal. Skull base and vertebrae: No acute fracture. Vertebral body heights are maintained. The dens and skull base are intact. Soft tissues and spinal canal: No prevertebral fluid or swelling. No visible canal hematoma. Disc levels: Disc space narrowing and endplate spurring at B3-Z3 and C6-C7. Scattered multilevel facet arthropathy. Upper chest: No acute abnormality. Enlarged right lobe with these thyroid gland containing small nodules, similar to prior neck CT 06/06/2015. Other: None. IMPRESSION: 1.  No acute intracranial abnormality. 2. Acute minimally displaced fracture anterior and lateral walls of the left maxillary sinus. 3. No fracture or subluxation of the cervical spine. Electronically Signed   By: Jeb Levering M.D.   On: 07/16/2017 21:10   Ct Cervical Spine Wo Contrast  Result Date: 07/16/2017 CLINICAL DATA:  Fall while carrying laundry downstairs. EXAM: CT HEAD WITHOUT CONTRAST CT CERVICAL SPINE WITHOUT CONTRAST TECHNIQUE: Multidetector CT imaging of  the head and cervical spine was performed following the standard protocol without intravenous contrast. Multiplanar CT image reconstructions of the cervical spine were also generated. COMPARISON:  None. FINDINGS: CT HEAD FINDINGS Brain: Mild generalized atrophy. Normal for age minimal chronic small vessel ischemia. No intracranial hemorrhage, mass effect, or midline shift. No hydrocephalus. The basilar cisterns are patent. No evidence of territorial infarct or acute ischemia. No extra-axial or intracranial fluid collection. Vascular: Atherosclerosis of skullbase vasculature without hyperdense vessel or abnormal calcification. Skull: No fracture or focal lesion. Sinuses/Orbits: Minimally displaced fractures through the anterior and lateral walls the left maxillary sinus with associated hemosinus. Mucosal thickening of left ethmoid air cells. Mastoid air cells are  clear. Bilateral cataract resection. Other: Small right frontal scalp hematoma. CT CERVICAL SPINE FINDINGS Alignment: Normal. Skull base and vertebrae: No acute fracture. Vertebral body heights are maintained. The dens and skull base are intact. Soft tissues and spinal canal: No prevertebral fluid or swelling. No visible canal hematoma. Disc levels: Disc space narrowing and endplate spurring at H2-Z2 and C6-C7. Scattered multilevel facet arthropathy. Upper chest: No acute abnormality. Enlarged right lobe with these thyroid gland containing small nodules, similar to prior neck CT 06/06/2015. Other: None. IMPRESSION: 1.  No acute intracranial abnormality. 2. Acute minimally displaced fracture anterior and lateral walls of the left maxillary sinus. 3. No fracture or subluxation of the cervical spine. Electronically Signed   By: Jeb Levering M.D.   On: 07/16/2017 21:10   Ct Knee Right Wo Contrast  Result Date: 07/16/2017 CLINICAL DATA:  Acute tibial plateau fracture, right knee pain. Fall carrying [employer]. EXAM: CT OF THE RIGHT KNEE WITHOUT  CONTRAST TECHNIQUE: Multidetector CT imaging of the RIGHT knee was performed according to the standard protocol. Multiplanar CT image reconstructions were also generated. COMPARISON:  Radiograph earlier this day FINDINGS: Bones/Joint/Cartilage Acute comminuted lateral tibial plateau fracture. Mild displacement involving the anterior-most cortex. Only minimal articular surface depression, less than 1 mm. Fracture extends to the tibial spines posteriorly. No vertically-oriented component. No metaphyseal component. Moderate lipohemarthrosis. No additional fracture of the patella, distal femur, or proximal fibula. Medial and lateral tibiofemoral chondrocalcinosis incidentally noted. Ligaments Suboptimally assessed by CT.  Possible ACL thickening. Muscles and Tendons No intramuscular hematoma. Quadriceps and patellar tendons are intact. Soft tissues Soft tissue edema about the knee. IMPRESSION: Schatzker type 3 minimally depressed lateral tibial plateau fracture. Electronically Signed   By: Jeb Levering M.D.   On: 07/16/2017 21:22   Dg Knee Complete 4 Views Right  Result Date: 07/16/2017 CLINICAL DATA:  Recent fall downstairs, generalized right knee pain EXAM: RIGHT KNEE - COMPLETE 4+ VIEW COMPARISON:  None available FINDINGS: Mild degenerative changes noted with chondrocalcinosis. Slight osteopenia. Joint effusion noted on the lateral view. Acute minimally depressed lateral tibial plateau fracture noted on the oblique views. IMPRESSION: Acute minimally depressed lateral tibial plateau fracture and likely an associated right knee effusion/ hemarthrosis. Right knee degenerative change with chondrocalcinosis. Electronically Signed   By: Jerilynn Mages.  Shick M.D.   On: 07/16/2017 18:17    Positive ROS: All other systems have been reviewed and were otherwise negative with the exception of those mentioned in the HPI and as above.  Objective: Labs cbc No results for input(s): WBC, HGB, HCT, PLT in the last 72  hours.  Labs inflam No results for input(s): CRP in the last 72 hours.  Invalid input(s): ESR  Labs coag No results for input(s): INR, PTT in the last 72 hours.  Invalid input(s): PT  No results for input(s): NA, K, CL, CO2, GLUCOSE, BUN, CREATININE, CALCIUM in the last 72 hours.  Physical Exam: Vitals:   07/17/17 0538 07/17/17 0651  BP: (!) 177/77 (!) 157/79  Pulse: 91 90  Resp:  19  Temp:  98.6 F (37 C)  SpO2: 94% 96%   General: Alert, no acute distress Mental status: Alert and Oriented x3 Neurologic: Speech Clear and organized, no gross focal findings or movement disorder appreciated. Respiratory: No cyanosis, no use of accessory musculature Cardiovascular: No pedal edema GI: Abdomen is soft and non-tender, non-distended. Skin: Warm and dry.  No lesions in the area of chief complaint . Extremities: Warm and well perfused w/o edema Psychiatric:  Patient is competent for consent with normal mood and affect  MUSCULOSKELETAL:  RUE: Sugar tong splint in place and in good condition.  Hand warm with good capillary refill.  Wiggles fingers freely.  Sensation intact. RLE: Right knee with moderate effusion.  No ecchymosis or lesion.  Minimal tenderness to palpation.  Skin does wrinkle.  Lower leg compressible.  No pain with active or passive range of motion at the ankle or toes.  EHL, FHL, dorsiflexion, plantarflexion intact.  Bilateral foot swelling at baseline per patient.  Other extremities are atraumatic with painless ROM and NVI.   Prudencio Burly III PA-C 07/17/2017 7:20 AM

## 2017-07-17 NOTE — Progress Notes (Signed)
Initial Nutrition Assessment  DOCUMENTATION CODES:   Obesity unspecified  INTERVENTION:    Continue Regular diet  NUTRITION DIAGNOSIS:   Increased nutrient needs related to other (see comment)(multiple fractures, s/p fall) as evidenced by estimated needs  GOAL:   Patient will meet greater than or equal to 90% of their needs  MONITOR:   PO intake, Labs, Weight trends, Skin  REASON FOR ASSESSMENT:   Consult Assessment of nutrition requirement/status  ASSESSMENT:   66 y.o. Female with medical history significant of RA; pseudogout; HTN; and GERD presenting following a fall.   RD spoke with pt at bedside. Husband present. Pt reports she has no appetite. States "it comes and goes". At home she typically consumes 1 meal per day. States "I'm not a big eater".  No unintentional weight loss reported. Medications reviewed and include Colace and Vitamin D. Pt declined addition of oral nutrition supplements at this time.  NUTRITION - FOCUSED PHYSICAL EXAM:    Most Recent Value  Orbital Region  No depletion  Upper Arm Region  No depletion  Thoracic and Lumbar Region  No depletion  Buccal Region  No depletion  Temple Region  No depletion  Clavicle Bone Region  No depletion  Clavicle and Acromion Bone Region  No depletion  Scapular Bone Region  No depletion  Dorsal Hand  No depletion  Patellar Region  No depletion  Anterior Thigh Region  No depletion  Posterior Calf Region  No depletion  Edema (RD Assessment)  None    Diet Order:  Diet regular Room service appropriate? Yes; Fluid consistency: Thin  EDUCATION NEEDS:   No education needs have been identified at this time  Skin:  Skin Assessment: Reviewed RN Assessment  Last BM:  11/28  Height:   Ht Readings from Last 1 Encounters:  07/17/17 5\' 6"  (1.676 m)   Weight:   Wt Readings from Last 1 Encounters:  07/04/17 236 lb (107 kg)   Ideal Body Weight:  59 kg  BMI:  Body mass index is 38.09  kg/m.  Estimated Nutritional Needs:   Kcal:  1950-2150  Protein:  100-115 gm  Fluid:  1.9-2.1 L  Arthur Holms, RD, LDN Pager #: [phone] After-Hours Pager #: [phone]

## 2017-07-17 NOTE — Progress Notes (Addendum)
Spoke with Dr. Ree Kida concerning patients nausea and pain.  Dilaudid 0.5 administered and Zofran 4 mg given.  Patient states nausea is still there but better and Dilaudid gave her a headache and muscle tightness in her jaw.  She stated she did not like the way it made her feel. New orders placed for Morphine and and Zofran q4 instead of q6 PRN. Instructed by Dr. Ree Kida to give Zofran before pain medication to decrease nausea. Possible order for Toradol if kidney function is WNL.

## 2017-07-18 ENCOUNTER — Encounter (HOSPITAL_COMMUNITY): Payer: Self-pay | Admitting: *Deleted

## 2017-07-18 ENCOUNTER — Inpatient Hospital Stay (HOSPITAL_COMMUNITY): Payer: 59 | Admitting: Certified Registered Nurse Anesthetist

## 2017-07-18 ENCOUNTER — Encounter (HOSPITAL_COMMUNITY)
Admission: EM | Disposition: A | Discharge status: Skilled Nursing Facility | Payer: Self-pay | Source: Home / Self Care | Attending: Internal Medicine

## 2017-07-18 HISTORY — PX: ORIF WRIST FRACTURE: SHX2133

## 2017-07-18 LAB — COMPREHENSIVE METABOLIC PANEL
ALBUMIN: 3.2 g/dL — AB (ref 3.5–5.0)
ALT: 20 U/L (ref 14–54)
AST: 17 U/L (ref 15–41)
Alkaline Phosphatase: 88 U/L (ref 38–126)
Anion gap: 6 (ref 5–15)
BUN: 16 mg/dL (ref 6–20)
CHLORIDE: 101 mmol/L (ref 101–111)
CO2: 27 mmol/L (ref 22–32)
CREATININE: 0.96 mg/dL (ref 0.44–1.00)
Calcium: 9.5 mg/dL (ref 8.9–10.3)
GFR calc Af Amer: >60 mL/min (ref >60)
GFR calc non Af Amer: 59 mL/min — ABNORMAL LOW (ref >60)
GLUCOSE: 101 mg/dL — AB (ref 65–99)
POTASSIUM: 3.9 mmol/L (ref 3.5–5.1)
SODIUM: 134 mmol/L — AB (ref 135–145)
Total Bilirubin: 0.6 mg/dL (ref 0.3–1.2)
Total Protein: 6 g/dL — ABNORMAL LOW (ref 6.5–8.1)

## 2017-07-18 LAB — CBC WITH DIFFERENTIAL/PLATELET
BASOS ABS: 0 10*3/uL (ref 0.0–0.1)
BASOS PCT: 0 %
EOS PCT: 3 %
Eosinophils Absolute: 0.2 10*3/uL (ref 0.0–0.7)
HCT: 34.1 % — ABNORMAL LOW (ref 36.0–46.0)
Hemoglobin: 10.6 g/dL — ABNORMAL LOW (ref 12.0–15.0)
Lymphocytes Relative: 15 %
Lymphs Abs: 1.1 10*3/uL (ref 0.7–4.0)
MCH: 23.9 pg — ABNORMAL LOW (ref 26.0–34.0)
MCHC: 31.1 g/dL (ref 30.0–36.0)
MCV: 76.8 fL — AB (ref 78.0–100.0)
MONO ABS: 0.6 10*3/uL (ref 0.1–1.0)
Monocytes Relative: 8 %
NEUTROS ABS: 5.3 10*3/uL (ref 1.7–7.7)
Neutrophils Relative %: 74 %
PLATELETS: 187 10*3/uL (ref 150–400)
RBC: 4.44 MIL/uL (ref 3.87–5.11)
RDW: 14.9 % (ref 11.5–15.5)
WBC: 7.1 10*3/uL (ref 4.0–10.5)

## 2017-07-18 LAB — CBC
HCT: 33 % — ABNORMAL LOW (ref 36.0–46.0)
Hemoglobin: 10.3 g/dL — ABNORMAL LOW (ref 12.0–15.0)
MCH: 23.6 pg — ABNORMAL LOW (ref 26.0–34.0)
MCHC: 31.2 g/dL (ref 30.0–36.0)
MCV: 75.5 fL — ABNORMAL LOW (ref 78.0–100.0)
PLATELETS: 192 10*3/uL (ref 150–400)
RBC: 4.37 MIL/uL (ref 3.87–5.11)
RDW: 14.8 % (ref 11.5–15.5)
WBC: 6.4 10*3/uL (ref 4.0–10.5)

## 2017-07-18 LAB — TYPE AND SCREEN
ABO/RH(D): A POS
Antibody Screen: NEGATIVE

## 2017-07-18 LAB — BASIC METABOLIC PANEL
Anion gap: 6 (ref 5–15)
BUN: 15 mg/dL (ref 6–20)
CHLORIDE: 101 mmol/L (ref 101–111)
CO2: 26 mmol/L (ref 22–32)
Calcium: 9.5 mg/dL (ref 8.9–10.3)
Creatinine, Ser: 0.93 mg/dL (ref 0.44–1.00)
GFR calc non Af Amer: >60 mL/min (ref >60)
GLUCOSE: 109 mg/dL — AB (ref 65–99)
Potassium: 3.7 mmol/L (ref 3.5–5.1)
SODIUM: 133 mmol/L — AB (ref 135–145)

## 2017-07-18 LAB — APTT: aPTT: 28 seconds (ref 24–36)

## 2017-07-18 LAB — PROTIME-INR
INR: 1.05
PROTHROMBIN TIME: 13.6 s (ref 11.4–15.2)

## 2017-07-18 LAB — SURGICAL PCR SCREEN
MRSA, PCR: NEGATIVE
Staphylococcus aureus: NEGATIVE

## 2017-07-18 SURGERY — OPEN REDUCTION INTERNAL FIXATION (ORIF) WRIST FRACTURE
Anesthesia: Regional | Laterality: Right

## 2017-07-18 MED ORDER — CHLORHEXIDINE GLUCONATE 4 % EX LIQD
60.0000 mL | Freq: Once | CUTANEOUS | Status: DC
  Administered 2017-07-18: 4 via TOPICAL

## 2017-07-18 MED ORDER — PHENYLEPHRINE HCL 10 MG/ML IJ SOLN
INTRAMUSCULAR | Status: DC | PRN
  Administered 2017-07-18: 40 ug/min via INTRAVENOUS

## 2017-07-18 MED ORDER — ACETAMINOPHEN 500 MG PO TABS
1000.0000 mg | ORAL_TABLET | Freq: Once | ORAL | Status: AC
  Administered 2017-07-18: 1000 mg via ORAL
  Filled 2017-07-18: qty 2

## 2017-07-18 MED ORDER — PROMETHAZINE HCL 25 MG/ML IJ SOLN: 6.2500 mg | INTRAMUSCULAR | Status: DC | PRN

## 2017-07-18 MED ORDER — LACTATED RINGERS IV SOLN
INTRAVENOUS | Status: DC
  Administered 2017-07-18: 13:00:00 via INTRAVENOUS
  Administered 2017-07-18: 1000 mL via INTRAVENOUS

## 2017-07-18 MED ORDER — ROPIVACAINE HCL 7.5 MG/ML IJ SOLN
INTRAMUSCULAR | Status: DC | PRN
  Administered 2017-07-18: 20 mL via PERINEURAL

## 2017-07-18 MED ORDER — PROPOFOL 10 MG/ML IV BOLUS
INTRAVENOUS | Status: DC | PRN
  Administered 2017-07-18: 110 mg via INTRAVENOUS

## 2017-07-18 MED ORDER — FENTANYL CITRATE (PF) 250 MCG/5ML IJ SOLN
INTRAMUSCULAR | Status: AC
  Filled 2017-07-18: qty 5

## 2017-07-18 MED ORDER — ONDANSETRON HCL 4 MG/2ML IJ SOLN
INTRAMUSCULAR | Status: DC | PRN
  Administered 2017-07-18: 4 mg via INTRAVENOUS

## 2017-07-18 MED ORDER — DEXAMETHASONE SODIUM PHOSPHATE 10 MG/ML IJ SOLN
INTRAMUSCULAR | Status: DC | PRN
  Administered 2017-07-18: 10 mg via INTRAVENOUS

## 2017-07-18 MED ORDER — MEPERIDINE HCL 25 MG/ML IJ SOLN: 6.2500 mg | INTRAMUSCULAR | Status: DC | PRN

## 2017-07-18 MED ORDER — FENTANYL CITRATE (PF) 100 MCG/2ML IJ SOLN
INTRAMUSCULAR | Status: AC
  Filled 2017-07-18: qty 2

## 2017-07-18 MED ORDER — 0.9 % SODIUM CHLORIDE (POUR BTL) OPTIME
TOPICAL | Status: DC | PRN
  Administered 2017-07-18: 1000 mL

## 2017-07-18 MED ORDER — MIDAZOLAM HCL 2 MG/2ML IJ SOLN
INTRAMUSCULAR | Status: AC
  Filled 2017-07-18: qty 2

## 2017-07-18 MED ORDER — LIDOCAINE HCL (CARDIAC) 20 MG/ML IV SOLN
INTRAVENOUS | Status: DC | PRN
  Administered 2017-07-18: 10 mg via INTRAVENOUS

## 2017-07-18 MED ORDER — MIDAZOLAM HCL 5 MG/5ML IJ SOLN
INTRAMUSCULAR | Status: DC | PRN
  Administered 2017-07-18: 1 mg via INTRAVENOUS

## 2017-07-18 MED ORDER — FENTANYL CITRATE (PF) 100 MCG/2ML IJ SOLN
INTRAMUSCULAR | Status: DC | PRN
  Administered 2017-07-18 (×2): 50 ug via INTRAVENOUS

## 2017-07-18 MED ORDER — DEXTROSE-NACL 5-0.45 % IV SOLN
100.0000 mL/h | INTRAVENOUS | Status: DC
Start: 2017-07-18 — End: 2017-07-18

## 2017-07-18 MED ORDER — MORPHINE SULFATE (PF) 4 MG/ML IV SOLN: 1.0000 mg | INTRAVENOUS | Status: DC | PRN

## 2017-07-18 MED ORDER — MIDAZOLAM HCL 2 MG/2ML IJ SOLN: 0.5000 mg | Freq: Once | INTRAMUSCULAR | Status: DC | PRN

## 2017-07-18 MED ORDER — ALUM & MAG HYDROXIDE-SIMETH 200-200-20 MG/5ML PO SUSP
15.0000 mL | ORAL | Status: DC | PRN
  Administered 2017-07-18: 15 mL via ORAL
  Filled 2017-07-18: qty 30

## 2017-07-18 MED ORDER — SUCCINYLCHOLINE CHLORIDE 200 MG/10ML IV SOSY
PREFILLED_SYRINGE | INTRAVENOUS | Status: DC | PRN
  Administered 2017-07-18: 100 mg via INTRAVENOUS

## 2017-07-18 MED ORDER — CEFAZOLIN SODIUM-DEXTROSE 2-4 GM/100ML-% IV SOLN
2.0000 g | INTRAVENOUS | Status: AC
  Administered 2017-07-18: 2 g via INTRAVENOUS
  Filled 2017-07-18: qty 100

## 2017-07-18 SURGICAL SUPPLY — 65 items
APL SKNCLS STERI-STRIP NONHPOA (GAUZE/BANDAGES/DRESSINGS) ×1
BANDAGE ACE 3X5.8 VEL STRL LF (GAUZE/BANDAGES/DRESSINGS) ×2 IMPLANT
BANDAGE ACE 4X5 VEL STRL LF (GAUZE/BANDAGES/DRESSINGS) ×2 IMPLANT
BENZOIN TINCTURE PRP APPL 2/3 (GAUZE/BANDAGES/DRESSINGS) ×2 IMPLANT
BIT DRILL 2.2 SS TIBIAL (BIT) ×1 IMPLANT
BLADE CLIPPER SURG (BLADE) IMPLANT
BNDG CMPR 9X4 STRL LF SNTH (GAUZE/BANDAGES/DRESSINGS) ×1
BNDG COHESIVE 4X5 TAN STRL (GAUZE/BANDAGES/DRESSINGS) ×2 IMPLANT
BNDG ESMARK 4X9 LF (GAUZE/BANDAGES/DRESSINGS) ×2 IMPLANT
CLSR STERI-STRIP ANTIMIC 1/2X4 (GAUZE/BANDAGES/DRESSINGS) ×1 IMPLANT
CORDS BIPOLAR (ELECTRODE) ×2 IMPLANT
COVER SURGICAL LIGHT HANDLE (MISCELLANEOUS) ×4 IMPLANT
CUFF TOURNIQUET SINGLE 18IN (TOURNIQUET CUFF) ×2 IMPLANT
CUFF TOURNIQUET SINGLE 24IN (TOURNIQUET CUFF) IMPLANT
DECANTER SPIKE VIAL GLASS SM (MISCELLANEOUS) IMPLANT
DRAPE OEC MINIVIEW 54X84 (DRAPES) IMPLANT
DRAPE U-SHAPE 47X51 STRL (DRAPES) ×2 IMPLANT
ELECT REM PT RETURN 9FT ADLT (ELECTROSURGICAL)
ELECTRODE REM PT RTRN 9FT ADLT (ELECTROSURGICAL) IMPLANT
GAUZE SPONGE 4X4 12PLY STRL (GAUZE/BANDAGES/DRESSINGS) ×2 IMPLANT
GAUZE XEROFORM 1X8 LF (GAUZE/BANDAGES/DRESSINGS) ×2 IMPLANT
GLOVE BIO SURGEON STRL SZ7.5 (GLOVE) ×4 IMPLANT
GLOVE BIOGEL PI IND STRL 8 (GLOVE) ×2 IMPLANT
GLOVE BIOGEL PI INDICATOR 8 (GLOVE) ×2
GOWN STRL REUS W/ TWL LRG LVL3 (GOWN DISPOSABLE) ×2 IMPLANT
GOWN STRL REUS W/ TWL XL LVL3 (GOWN DISPOSABLE) ×1 IMPLANT
GOWN STRL REUS W/TWL LRG LVL3 (GOWN DISPOSABLE) ×4
GOWN STRL REUS W/TWL XL LVL3 (GOWN DISPOSABLE) ×2
K-WIRE 1.6 (WIRE) ×4
K-WIRE FX5X1.6XNS BN SS (WIRE) ×2
KIT BASIN OR (CUSTOM PROCEDURE TRAY) ×2 IMPLANT
KIT ROOM TURNOVER OR (KITS) ×2 IMPLANT
KWIRE FX5X1.6XNS BN SS (WIRE) IMPLANT
MANIFOLD NEPTUNE II (INSTRUMENTS) ×2 IMPLANT
NDL HYPO 25GX1X1/2 BEV (NEEDLE) IMPLANT
NEEDLE HYPO 25GX1X1/2 BEV (NEEDLE) IMPLANT
NS IRRIG 1000ML POUR BTL (IV SOLUTION) ×4 IMPLANT
PACK ORTHO EXTREMITY (CUSTOM PROCEDURE TRAY) ×2 IMPLANT
PAD ARMBOARD 7.5X6 YLW CONV (MISCELLANEOUS) ×4 IMPLANT
PAD CAST 3X4 CTTN HI CHSV (CAST SUPPLIES) IMPLANT
PAD CAST 4YDX4 CTTN HI CHSV (CAST SUPPLIES) ×1 IMPLANT
PADDING CAST COTTON 3X4 STRL (CAST SUPPLIES) ×2
PADDING CAST COTTON 4X4 STRL (CAST SUPPLIES) ×2
PEG LOCKING SMOOTH 2.2X18 (Peg) ×4 IMPLANT
PEG LOCKING SMOOTH 2.2X20 (Screw) ×3 IMPLANT
PLATE DVR CROSSLOCK STD RT (Plate) ×1 IMPLANT
SCREW  LP NL 2.7X15MM (Screw) ×2 IMPLANT
SCREW LOCK 14X2.7X 3 LD TPR (Screw) IMPLANT
SCREW LOCKING 2.7X14 (Screw) ×2 IMPLANT
SCREW LP NL 2.7X15MM (Screw) IMPLANT
SPLINT PLASTER CAST XFAST 5X30 (CAST SUPPLIES) IMPLANT
SPLINT PLASTER XFAST SET 5X30 (CAST SUPPLIES) ×1
SPONGE LAP 4X18 X RAY DECT (DISPOSABLE) ×2 IMPLANT
STRIP CLOSURE SKIN 1/2X4 (GAUZE/BANDAGES/DRESSINGS) ×2 IMPLANT
SUT ETHILON 3 0 PS 1 (SUTURE) ×1 IMPLANT
SUT MNCRL AB 4-0 PS2 18 (SUTURE) ×2 IMPLANT
SUT VIC AB 0 CT1 27 (SUTURE) ×2
SUT VIC AB 0 CT1 27XBRD ANBCTR (SUTURE) IMPLANT
SYR CONTROL 10ML LL (SYRINGE) IMPLANT
TOWEL OR 17X24 6PK STRL BLUE (TOWEL DISPOSABLE) ×2 IMPLANT
TOWEL OR 17X26 10 PK STRL BLUE (TOWEL DISPOSABLE) ×2 IMPLANT
TOWEL OR NON WOVEN STRL DISP B (DISPOSABLE) ×2 IMPLANT
TUBE CONNECTING 12X1/4 (SUCTIONS) ×2 IMPLANT
WATER STERILE IRR 1000ML POUR (IV SOLUTION) ×4 IMPLANT
YANKAUER SUCT BULB TIP NO VENT (SUCTIONS) IMPLANT

## 2017-07-18 NOTE — Progress Notes (Signed)
Pt to be transferred to room per Herschel Senegal RN , awaiting call from floor RN

## 2017-07-18 NOTE — Care Management Note (Signed)
Case Management Note  Patient Details  Name: KENIAH KLEMMER MRN: [id number] Date of Birth: [birth year]  Subjective/Objective:  Fall, facial injury, wrist fracture, ORIF right wrist                  Action/Plan: Discharge Planning: Pending SNF vs HH. Will need HH PT/OT orders with F2F if dc to home. PT/OT recommending SNF. Will follow up with pt to decide if SNF vs HH. CSW following for SNF placement.   PCP  Lucretia Kern MD  Expected Discharge Date:                Expected Discharge Plan:  [place]  In-House Referral:  NA  Discharge planning Services  CM Consult  Post Acute Care Choice:  Home Health Choice offered to:  Patient  DME Arranged:    DME Agency:     HH Arranged:  PT, OT HH Agency:  [facility]  Status of Service:     If discussed at [place] of Stay Meetings, dates discussed:    Additional Comments:  Erenest Rasher, RN 07/18/2017, 3:29 PM

## 2017-07-18 NOTE — Progress Notes (Signed)
OT Cancellation Note  Patient Details Name: [name] MRN: [id number] DOB: 05/09/1951   Cancelled Treatment:    Reason Eval/Treat Not Completed: Patient at procedure or test/ unavailable. Pt off unit at OR  Britt Bottom 07/18/2017, 12:41 PM

## 2017-07-18 NOTE — Plan of Care (Signed)
  Progressing Pain Managment: General experience of comfort will improve 07/18/2017 0509 - Progressing by West Pugh, RN 07/18/2017 0508 - Progressing by West Pugh, RN Safety: Ability to remain free from injury will improve 07/18/2017 0509 - Progressing by West Pugh, RN 07/18/2017 0508 - Progressing by West Pugh, RN Skin Integrity: Risk for impaired skin integrity will decrease 07/18/2017 0509 - Progressing by West Pugh, RN 07/18/2017 0508 - Progressing by West Pugh, RN   Nutrition: Adequate nutrition will be maintained 07/18/2017 0509 - Not Progressing by West Pugh, RN Patient complains loss of appetite due to constant pain.

## 2017-07-18 NOTE — Progress Notes (Signed)
Physical Therapy Treatment Patient Details Name: [name] MRN: [id number] DOB: Jun 29, 1951 Today's Date: 07/18/2017    History of Present Illness Pt is a 66 y/o female who presents s/p fall down the stairs at home. Pt sustained a R tibial plateau fracture and a R distal radius fracture. She is NWB through the R wrist but is Ok to bear weight through the elbow, and NWB on the RLE. PMH significant for L1-S1 PLIF 09/2016.    PT Comments    Pt progressing slowly towards physical therapy goals. Although continues to require +2 assist for all aspects of mobility, pt demonstrated improvement today with transition to EOB. She was able to tolerate sit<>stand to platform walker, however had immediate onset of nausea and required seated rest. Discussed the updated recommendation of continued rehab at the SNF level to maximize functional independence prior to return home. Pt and husband agreeable.   Follow Up Recommendations  SNF;Supervision/Assistance - 24 hour     Equipment Recommendations  Wheelchair (measurements PT);Wheelchair cushion (measurements PT)(R platform RW pt 5'6")    Recommendations for Other Services       Precautions / Restrictions Precautions Precautions: Fall Required Braces or Orthoses: Knee Immobilizer - Right Knee Immobilizer - Right: On when out of bed or walking Restrictions Weight Bearing Restrictions: Yes RUE Weight Bearing: Weight bear through elbow only RLE Weight Bearing: Non weight bearing Other Position/Activity Restrictions: Must have KI donned for mobility    Mobility  Bed Mobility Overal bed mobility: Needs Assistance Bed Mobility: Supine to Sit;Sit to Supine     Supine to sit: Max assist;+2 for physical assistance Sit to supine: Max assist;+2 for physical assistance   General bed mobility comments: +2 assist for all aspects of bed mobility. Pt required assist to advance RLE as well as elevate trunk to full sitting position. Bed pad used to  complete scooting activity to EOB. To return to supine, pt required +2 assist for LE elevation and trunk support, as well as scooting towards HOB.   Transfers                 General transfer comment: Pt refused any OOB mobility at this time due to pain and nausea.   Ambulation/Gait                 Stairs            Wheelchair Mobility    Modified Rankin (Stroke Patients Only)       Balance Overall balance assessment: Needs assistance Sitting-balance support: Feet supported;Bilateral upper extremity supported Sitting balance-Leahy Scale: Poor Sitting balance - Comments: BUE support at all times but no outside support was required to maintain sitting balance.                                     Cognition Arousal/Alertness: Lethargic Behavior During Therapy: Flat affect;Anxious Overall Cognitive Status: Within Functional Limits for tasks assessed                                        Exercises      General Comments        Pertinent Vitals/Pain Pain Assessment: Faces Faces Pain Scale: Hurts even more Pain Location: knee, wrist Pain Descriptors / Indicators: Discomfort;Aching Pain Intervention(s): Limited activity within patient's tolerance;Monitored during session;Repositioned  Home Living                      Prior Function            PT Goals (current goals can now be found in the care plan section) Acute Rehab PT Goals Patient Stated Goal: Feel better PT Goal Formulation: With patient Time For Goal Achievement: 07/24/17 Potential to Achieve Goals: Fair Progress towards PT goals: Progressing toward goals    Frequency    Min 3X/week      PT Plan Discharge plan needs to be updated;Frequency needs to be updated    Co-evaluation              AM-PAC PT "6 Clicks" Daily Activity  Outcome Measure  Difficulty turning over in bed (including adjusting bedclothes, sheets and blankets)?:  Unable Difficulty moving from lying on back to sitting on the side of the bed? : Unable Difficulty sitting down on and standing up from a chair with arms (e.g., wheelchair, bedside commode, etc,.)?: Unable Help needed moving to and from a bed to chair (including a wheelchair)?: Total Help needed walking in hospital room?: Total Help needed climbing 3-5 steps with a railing? : Total 6 Click Score: 6    End of Session Equipment Utilized During Treatment: Gait belt Activity Tolerance: Patient limited by pain Patient left: in bed;with call bell/phone within reach;with bed alarm set Nurse Communication: Mobility status PT Visit Diagnosis: Unsteadiness on feet (R26.81);History of falling (Z91.81);Difficulty in walking, not elsewhere classified (R26.2);Pain Pain - Right/Left: Right Pain - part of body: Arm;Leg     Time: 0355-9741 PT Time Calculation (min) (ACUTE ONLY): 23 min  Charges:  $Therapeutic Activity: 23-37 mins                    G Codes:       Rolinda Roan, PT, DPT Acute Rehabilitation Services Pager: [phone]    Thelma Comp 07/18/2017, 11:54 AM

## 2017-07-18 NOTE — Op Note (Signed)
07/16/2017 - 07/18/2017  2:28 PM  PATIENT:  [name]    PRE-OPERATIVE DIAGNOSIS:  right wrist fracture  POST-OPERATIVE DIAGNOSIS:  Same  PROCEDURE:  OPEN REDUCTION INTERNAL FIXATION (ORIF) WRIST FRACTURE  SURGEON:  Santo Zahradnik D, MD  ASSISTANT: none  ANESTHESIA:   gen  PREOPERATIVE INDICATIONS:  [name] is a  66 y.o. female with a diagnosis of right wrist fracture who failed conservative measures and elected for surgical management.    The risks benefits and alternatives were discussed with the patient preoperatively including but not limited to the risks of infection, bleeding, nerve injury, cardiopulmonary complications, the need for revision surgery, among others, and the patient was willing to proceed.  OPERATIVE IMPLANTS: DVR plate   TOURNIQUET TIME: 51min  OPERATIVE PROCEDURE:  Patient was identified in the preoperative holding area and site was marked by me She was transported to the operating theater and placed on the table in supine position taking care to pad all bony prominences. After a preincinduction time out anesthesia was induced. The right upper extremity was prepped and draped in normal sterile fashion and a pre-incision timeout was performed. She received ancef for preoperative antibiotics.   I made a 5 cm incision centered over her FCR tendon and dissected down carefully to the level of the flexor tendon sheath and incise this longitudinally and retracted the FCR radially and incised the dorsal aspect of the sheath.   I bluntly dissected the FPL muscle belly away from the brachioradialis and then sharply incised the pronator tendon from the distal radius and from the wrist capsule. I Elevated this off the bone the fractures visible.   I released the brachioradialis from its insertion. I then debrided the fracture and performed a manual reduction.   I selected a plate and I placed it on the bone. I pinned it into place and was happy on  multiple radiographic views with it's placement. I then fixed the plate distally with the locking pegs. I confirmed no articular penetration with the pegs and that none were prominent dorsally.   I then reduced the plate to the shaft improving the volar and radial tilt of her distal radius.  I was happy with the final fluoro xrays    I thoroughly irrigated the wound and closed the pronator over top of the plate and then closed the skin in layers with absorbable stitch. Sterile dressing was applied using the PACU in stable condition.  POST OPERATIVE PLAN: NWB, Splint full time. Ambulate for DVT px.

## 2017-07-18 NOTE — NC FL2 (Signed)
[place] LEVEL OF CARE SCREENING TOOL     IDENTIFICATION  Patient Name: [name] Birthdate: [birth year] Sex: female Admission Date (Current Location): 07/16/2017  [facility] and [place] Number:  Herbalist and Address:  [facility]. [facility], [place] [address] [place], [place] [address]      Provider Number: [id number]  Attending Physician Name and Address:  Cristal Ford, DO  Relative Name and Phone Number:  [name], spouse, [phone]    Current Level of Care: Hospital Recommended Level of Care: [place] Prior Approval Number:    Date Approved/Denied:   PASRR Number: [id number]  Discharge Plan: SNF    Current Diagnoses: Patient Active Problem List   Diagnosis Date Noted  . Fall at home, initial encounter 07/17/2017  . Left maxillary fracture ([place]) 07/17/2017  . Closed right radial fracture 07/17/2017  . Tibial plateau fracture, right, closed, initial encounter 07/16/2017  . Hyperparathyroidism ([place]) 05/06/2017  . Lumbar stenosis with neurogenic claudication 10/11/2016  . H/O breast augmentation 09/12/2016  . Obesity 09/12/2016  . L-S radiculopathy 05/30/2016  . Neck mass 05/30/2015  . Hx of colonic polyp - ssp 11/03/2014  . Neuropathy 07/06/2014  . Hyperlipidemia 06/02/2014  . Anxiety state 06/02/2014  . Allergic rhinitis   . Osteopenia 10/17/2011  . Rheumatoid arthritis ([place])   . Hypertension     Orientation RESPIRATION BLADDER Height & Weight     Self, Time, Situation, Place  Normal Continent, External catheter Weight:   Height:  5\' 6"  (167.6 cm)  BEHAVIORAL SYMPTOMS/MOOD NEUROLOGICAL BOWEL NUTRITION STATUS      Continent Diet(Please see DC Summary)  AMBULATORY STATUS COMMUNICATION OF NEEDS Skin   Limited Assist Verbally Normal                       Personal Care Assistance Level of Assistance  Bathing, Feeding, Dressing Bathing Assistance: Maximum assistance Feeding assistance:  Independent Dressing Assistance: Limited assistance     Functional Limitations Info             SPECIAL CARE FACTORS FREQUENCY  PT (By licensed PT)     PT Frequency: 5x/week              Contractures      Additional Factors Info  Code Status, Allergies Code Status Info: Full Allergies Info: Amlodipine Besylate, Nortriptyline, Dilaudid Hydromorphone Hcl           Current Medications (07/18/2017):  This is the current hospital active medication list Current Facility-Administered Medications  Medication Dose Route Frequency Provider Last Rate Last Dose  . cefUROXime (CEFTIN) tablet 250 mg  250 mg Oral BID WC Karmen Bongo, MD   250 mg at 07/17/17 1753  . cholecalciferol (VITAMIN D) tablet 5,000 Units  5,000 Units Oral Daily Karmen Bongo, MD      . docusate sodium (COLACE) capsule 100 mg  100 mg Oral BID Karmen Bongo, MD   100 mg at 07/17/17 2151  . gabapentin (NEURONTIN) capsule 200 mg  200 mg Oral Daily Karmen Bongo, MD      . gabapentin (NEURONTIN) capsule 300 mg  300 mg Oral Ivery Quale, MD   300 mg at 07/17/17 2150  . heparin injection 5,000 Units  5,000 Units Subcutaneous Lynne Logan, MD   Stopped at 07/18/17 0600  . HYDROcodone-acetaminophen (NORCO/VICODIN) 5-325 MG per tablet 1-2 tablet  1-2 tablet Oral Q6H PRN Karmen Bongo, MD      .  hydroxychloroquine (PLAQUENIL) tablet 200 mg  200 mg Oral Daily Karmen Bongo, MD      . ketorolac (TORADOL) 30 MG/ML injection 30 mg  30 mg Intravenous Q8H PRN Cristal Ford, DO   30 mg at 07/18/17 0210  . losartan (COZAAR) tablet 100 mg  100 mg Oral Daily Karmen Bongo, MD      . metoprolol succinate (TOPROL-XL) 24 hr tablet 50 mg  50 mg Oral BID Karmen Bongo, MD   50 mg at 07/17/17 2150  . morphine 4 MG/ML injection 2 mg  2 mg Intravenous Q4H PRN Cristal Ford, DO   2 mg at 07/18/17 1034  . ondansetron (ZOFRAN) injection 4 mg  4 mg Intravenous Q4H PRN Cristal Ford, DO   4 mg at 07/18/17  1034  . polyethylene glycol (MIRALAX / GLYCOLAX) packet 17 g  17 g Oral Daily PRN Karmen Bongo, MD      . sulfaSALAzine (AZULFIDINE) EC tablet 1,000 mg  1,000 mg Oral BID WC Karmen Bongo, MD   1,000 mg at 07/17/17 1753  . tiZANidine (ZANAFLEX) tablet 2-4 mg  2-4 mg Oral Q6H PRN Karmen Bongo, MD         Discharge Medications: Please see discharge summary for a list of discharge medications.  Relevant Imaging Results:  Relevant Lab Results:   Additional Information SSN: [place], [place]

## 2017-07-18 NOTE — Anesthesia Procedure Notes (Signed)
Anesthesia Regional Block: Supraclavicular block   Pre-Anesthetic Checklist: ,, timeout performed, Correct Patient, Correct Site, Correct Laterality, Correct Procedure, Correct Position, site marked, Risks and benefits discussed,  Surgical consent,  Pre-op evaluation,  At surgeon's request and post-op pain management  Laterality: Right and Upper  Prep: chloraprep       Needles:  Injection technique: Single-shot  Needle Type: Echogenic Needle     Needle Length: 4cm  Needle Gauge: 21     Additional Needles:   Procedures:,,,, ultrasound used (permanent image in chart),,,,  Narrative:  Start time: 07/18/2017 12:51 PM End time: 07/18/2017 1:04 PM Injection made incrementally with aspirations every 5 mL.  Performed by: Personally  Anesthesiologist: Annye Asa, MD  Additional Notes: Pt identified in Holding room.  Monitors applied. Working IV access confirmed. Sterile prep, drape R clavicle and neck.  #2iga ECHOgenic needle supraclavicular, with US guidance.  20cc 0.75% Ropivacaine injected incrementally after negative test dose.  Patient asymptomatic, VSS, no heme aspirated, tolerated well.  Jenita Seashore, MD

## 2017-07-18 NOTE — Anesthesia Procedure Notes (Signed)
Procedure Name: Intubation Date/Time: 07/18/2017 1:34 PM Performed by: Carney Living, CRNA Pre-anesthesia Checklist: Patient identified, Emergency Drugs available, Suction available, Patient being monitored and Timeout performed Patient Re-evaluated:Patient Re-evaluated prior to induction Oxygen Delivery Method: Circle system utilized Preoxygenation: Pre-oxygenation with 100% oxygen Induction Type: IV induction, Rapid sequence and Cricoid Pressure applied Laryngoscope Size: Glidescope and 4 Grade View: Grade I Tube type: Oral Tube size: 7.0 mm Number of attempts: 1 Airway Equipment and Method: Stylet and Video-laryngoscopy Placement Confirmation: ETT inserted through vocal cords under direct vision,  positive ETCO2 and breath sounds checked- equal and bilateral Secured at: 23 cm Tube secured with: Tape Dental Injury: Teeth and Oropharynx as per pre-operative assessment

## 2017-07-18 NOTE — Interval H&P Note (Signed)
History and Physical Interval Note:  07/18/2017 8:40 AM  [name]  has presented today for surgery, with the diagnosis of right wrist fx  The various methods of treatment have been discussed with the patient and family. After consideration of risks, benefits and other options for treatment, the patient has consented to  Procedure(s): OPEN REDUCTION INTERNAL FIXATION (ORIF) WRIST FRACTURE (Right) as a surgical intervention .  The patient's history has been reviewed, patient examined, no change in status, stable for surgery.  I have reviewed the patient's chart and labs.  Questions were answered to the patient's satisfaction.     [name]

## 2017-07-18 NOTE — Anesthesia Postprocedure Evaluation (Signed)
Anesthesia Post Note  Patient: [name]  Procedure(s) Performed: OPEN REDUCTION INTERNAL FIXATION (ORIF) WRIST FRACTURE (Right )     Patient location during evaluation: PACU Anesthesia Type: Regional and General Level of consciousness: sedated Pain management: pain level controlled Vital Signs Assessment: post-procedure vital signs reviewed and stable Respiratory status: spontaneous breathing and respiratory function stable Cardiovascular status: stable Postop Assessment: no apparent nausea or vomiting Anesthetic complications: no    Last Vitals:  Vitals:   07/18/17 1445 07/18/17 1500  BP: 114/60 130/62  Pulse: 77 74  Resp: 14 15  Temp:    SpO2: (!) 89% 93%    Last Pain:  Vitals:   07/18/17 1104  TempSrc:   PainSc: 5                  Stacy Sailer DANIEL

## 2017-07-18 NOTE — Progress Notes (Signed)
PROGRESS NOTE    [name]  MWN:[id number] DOB: November 09, 1950 DOA: 07/16/2017 PCP: Lucretia Kern, DO   Chief Complaint  Patient presents with  . Fall  . Facial Injury  . Epistaxis  . Wrist Pain    right  . Knee Pain    right    Brief Narrative:  HPI On 07/16/2017 by Dr. Karmen Bongo [name] is a 66 y.o. female with medical history significant of RA; pseudogout; HTN; and GERD presenting following a fall.    Patient was carrying laundry down the stairs.  She was about 2-3 steps down and she slipped.  She lost her footing and went headfirst down the stairs.  She slid on her chest and rammed her face into the floor. She injured her left face and right wrist and knee.  Her hip was hurting a bit but that seems to be okay.  She has a h/o swollen LM in her left submandibular neck years ago and it recurred recently.  She also had hypercalcemia; she was placed on vitamin D for vitamin D deficiency.  She saw ENT twice including yesterday and he has placed her on 2 consecutive antibiotics.  Assessment & Plan   Fall with multiple fractures -Patient noted to have maxillary fracture, likely to require surgical intervention -Right Radial fracture noted, currently splinted- plan for surgery today -Right Tibial plateau fracture, currently an immobilizer -Orthopedics consulted and appreciated -Discuss with Dr. Fredonia Highland, who stated that she possibly have surgical intervention on her wrist on 07/18/2017. May not need surgical intervention on tibial plateau fracture. -PT recommended SNF -Continue pain control and antiemetics as needed  Neck mass -Patient follows with Dr. Claria Dice. Possibly reactive lymph node -Continue Ceftin  Hyperparathyroidism -Followed by PCP and endocrinology -Continue vitamin D supplementation -TSH recently checked 1 week ago and was normal  Essential hypertension -Continue Toprol, Cozaar -Lasix held  RA -Continue Plaquenil, sulfasalazine  DVT  Prophylaxis  Heparin  Code Status: Full  Family Communication: Husband at bedside  Disposition Plan: Admitted. Pending surgery today, likely will need SNF  Consultants Orthopedics  Procedures  None  Antibiotics   Anti-infectives (From admission, onward)   Start     Dose/Rate Route Frequency Ordered Stop   07/19/17 0600  ceFAZolin (ANCEF) IVPB 2g/100 mL premix     2 g 200 mL/hr over 30 Minutes Intravenous On call to O.R. 07/18/17 1224 07/20/17 0559   07/17/17 1000  [MAR Hold]  hydroxychloroquine (PLAQUENIL) tablet 200 mg     (MAR Hold since 07/18/17 1138)  Comments:  Patient is currently taking     200 mg Oral Daily 07/17/17 0628     07/17/17 0800  [MAR Hold]  cefUROXime (CEFTIN) tablet 250 mg     (MAR Hold since 07/18/17 1138)   250 mg Oral 2 times daily with meals 07/17/17 0628 07/20/17 0759      Subjective:   [name] seen and examined today.  Continues to complain of pain and soreness all over. Denies chest pain, shortness of breath, abdominal pain. Has had some nausea with pain medications.   Objective:   Vitals:   07/17/17 2001 07/18/17 0359 07/18/17 0840 07/18/17 1100  BP: (!) 164/78 (!) 121/48    Pulse: 85 97    Resp: 18 19    Temp: 99.5 F (37.5 C) 99.8 F (37.7 C)    TempSrc: Oral Axillary    SpO2: 93% 93%    Weight:    107 kg (235 lb  14.3 oz)  Height:   5' (1.524 m) 5\' 6"  (1.676 m)    Intake/Output Summary (Last 24 hours) at 07/18/2017 1226 Last data filed at 07/18/2017 0900 Gross per 24 hour  Intake 320 ml  Output 1000 ml  Net -680 ml   Filed Weights   07/18/17 1100  Weight: 107 kg (235 lb 14.3 oz)   Exam  General: Well developed, well nourished, NAD, appears stated age  HEENT: NCAT, mucous membranes moist. Facial bruising   Cardiovascular: S1 S2 auscultated, RRR, no murmurs  Respiratory: Clear to auscultation bilaterally with equal chest rise  Abdomen: Soft, nontender, nondistended, + bowel sounds  Extremities: warm dry without  cyanosis clubbing or edema. RLE in immobilizer. RUE wrapped  Neuro: AAOx3, nonfocal  Psych: Appropriate mood and affect  Data Reviewed: I have personally reviewed following labs and imaging studies  CBC: Recent Labs  Lab 07/17/17 1202 07/18/17 0359  WBC 9.2 6.4  HGB 11.3* 10.3*  HCT 36.0 33.0*  MCV 75.5* 75.5*  PLT 197 403   Basic Metabolic Panel: Recent Labs  Lab 07/17/17 1202 07/18/17 0359  NA 135 133*  K 3.5 3.7  CL 102 101  CO2 27 26  GLUCOSE 118* 109*  BUN 12 15  CREATININE 0.86 0.93  CALCIUM 10.0 9.5   GFR: Estimated Creatinine Clearance: 71.7 mL/min (by C-G formula based on SCr of 0.93 mg/dL). Liver Function Tests: No results for input(s): AST, ALT, ALKPHOS, BILITOT, PROT, ALBUMIN in the last 168 hours. No results for input(s): LIPASE, AMYLASE in the last 168 hours. No results for input(s): AMMONIA in the last 168 hours. Coagulation Profile: No results for input(s): INR, PROTIME in the last 168 hours. Cardiac Enzymes: No results for input(s): CKTOTAL, CKMB, CKMBINDEX, TROPONINI in the last 168 hours. BNP (last 3 results) No results for input(s): PROBNP in the last 8760 hours. HbA1C: No results for input(s): HGBA1C in the last 72 hours. CBG: No results for input(s): GLUCAP in the last 168 hours. Lipid Profile: No results for input(s): CHOL, HDL, LDLCALC, TRIG, CHOLHDL, LDLDIRECT in the last 72 hours. Thyroid Function Tests: No results for input(s): TSH, T4TOTAL, FREET4, T3FREE, THYROIDAB in the last 72 hours. Anemia Panel: No results for input(s): VITAMINB12, FOLATE, FERRITIN, TIBC, IRON, RETICCTPCT in the last 72 hours. Urine analysis:    Component Value Date/Time   COLORURINE YELLOW 04/29/2014 [place] 04/29/2014 1244   LABSPEC 1.010 04/29/2014 1244   PHURINE 5.5 04/29/2014 [place] 04/29/2014 [place] 04/29/2014 1244   BILIRUBINUR NEGATIVE 04/29/2014 1244   KETONESUR NEGATIVE 04/29/2014 1244    PROTEINUR NEGATIVE 03/23/2010 1124   UROBILINOGEN 0.2 04/29/2014 1244   NITRITE NEGATIVE 04/29/2014 1244   LEUKOCYTESUR NEGATIVE 04/29/2014 1244   Sepsis Labs: @LABRCNTIP (procalcitonin:4,lacticidven:4)  ) Recent Results (from the past 240 hour(s))  Surgical pcr screen     Status: None   Collection Time: 07/18/17  6:18 AM  Result Value Ref Range Status   MRSA, PCR NEGATIVE NEGATIVE Final   Staphylococcus aureus NEGATIVE NEGATIVE Final    Comment: (NOTE) The Xpert SA Assay (FDA approved for NASAL specimens in patients 88 years of age and older), is one component of a comprehensive surveillance program. It is not intended to diagnose infection nor to guide or monitor treatment.       Radiology Studies: Dg Wrist Complete Right  Result Date: 07/16/2017 CLINICAL DATA:  Right wrist pain post fall. EXAM: RIGHT WRIST - COMPLETE 3+ VIEW  COMPARISON:  05/09/2004 FINDINGS: There is a mildly comminuted impacted fracture of the distal radius with no significant displacement. The main horizontal fracture line is located 18 mm proximal to the radiocarpal joint. Additional longitudinal lucency extends to the radiocarpal joint. It is difficult to determine whether this particular lucency represents an intra-articular extension of the acute fracture or incomplete healing of the previous fracture demonstrated in 2005, as it has a strikingly similar appearance. Old posttraumatic changes from avulsion fracture of the ulnar styloid process. IMPRESSION: Mildly comminuted impacted fracture of the distal radius with no significant displacement. It is uncertain whether there is an intra-articular extension to the radiocarpal joint. Electronically Signed   By: Fidela Salisbury M.D.   On: 07/16/2017 18:29   Ct Head Wo Contrast  Result Date: 07/16/2017 CLINICAL DATA:  Fall while carrying laundry downstairs. EXAM: CT HEAD WITHOUT CONTRAST CT CERVICAL SPINE WITHOUT CONTRAST TECHNIQUE: Multidetector CT imaging of  the head and cervical spine was performed following the standard protocol without intravenous contrast. Multiplanar CT image reconstructions of the cervical spine were also generated. COMPARISON:  None. FINDINGS: CT HEAD FINDINGS Brain: Mild generalized atrophy. Normal for age minimal chronic small vessel ischemia. No intracranial hemorrhage, mass effect, or midline shift. No hydrocephalus. The basilar cisterns are patent. No evidence of territorial infarct or acute ischemia. No extra-axial or intracranial fluid collection. Vascular: Atherosclerosis of skullbase vasculature without hyperdense vessel or abnormal calcification. Skull: No fracture or focal lesion. Sinuses/Orbits: Minimally displaced fractures through the anterior and lateral walls the left maxillary sinus with associated hemosinus. Mucosal thickening of left ethmoid air cells. Mastoid air cells are clear. Bilateral cataract resection. Other: Small right frontal scalp hematoma. CT CERVICAL SPINE FINDINGS Alignment: Normal. Skull base and vertebrae: No acute fracture. Vertebral body heights are maintained. The dens and skull base are intact. Soft tissues and spinal canal: No prevertebral fluid or swelling. No visible canal hematoma. Disc levels: Disc space narrowing and endplate spurring at F0-Y6 and C6-C7. Scattered multilevel facet arthropathy. Upper chest: No acute abnormality. Enlarged right lobe with these thyroid gland containing small nodules, similar to prior neck CT 06/06/2015. Other: None. IMPRESSION: 1.  No acute intracranial abnormality. 2. Acute minimally displaced fracture anterior and lateral walls of the left maxillary sinus. 3. No fracture or subluxation of the cervical spine. Electronically Signed   By: Jeb Levering M.D.   On: 07/16/2017 21:10   Ct Cervical Spine Wo Contrast  Result Date: 07/16/2017 CLINICAL DATA:  Fall while carrying laundry downstairs. EXAM: CT HEAD WITHOUT CONTRAST CT CERVICAL SPINE WITHOUT CONTRAST  TECHNIQUE: Multidetector CT imaging of the head and cervical spine was performed following the standard protocol without intravenous contrast. Multiplanar CT image reconstructions of the cervical spine were also generated. COMPARISON:  None. FINDINGS: CT HEAD FINDINGS Brain: Mild generalized atrophy. Normal for age minimal chronic small vessel ischemia. No intracranial hemorrhage, mass effect, or midline shift. No hydrocephalus. The basilar cisterns are patent. No evidence of territorial infarct or acute ischemia. No extra-axial or intracranial fluid collection. Vascular: Atherosclerosis of skullbase vasculature without hyperdense vessel or abnormal calcification. Skull: No fracture or focal lesion. Sinuses/Orbits: Minimally displaced fractures through the anterior and lateral walls the left maxillary sinus with associated hemosinus. Mucosal thickening of left ethmoid air cells. Mastoid air cells are clear. Bilateral cataract resection. Other: Small right frontal scalp hematoma. CT CERVICAL SPINE FINDINGS Alignment: Normal. Skull base and vertebrae: No acute fracture. Vertebral body heights are maintained. The dens and skull base are intact. Soft tissues  and spinal canal: No prevertebral fluid or swelling. No visible canal hematoma. Disc levels: Disc space narrowing and endplate spurring at L2-X5 and C6-C7. Scattered multilevel facet arthropathy. Upper chest: No acute abnormality. Enlarged right lobe with these thyroid gland containing small nodules, similar to prior neck CT 06/06/2015. Other: None. IMPRESSION: 1.  No acute intracranial abnormality. 2. Acute minimally displaced fracture anterior and lateral walls of the left maxillary sinus. 3. No fracture or subluxation of the cervical spine. Electronically Signed   By: Jeb Levering M.D.   On: 07/16/2017 21:10   Ct Knee Right Wo Contrast  Result Date: 07/16/2017 CLINICAL DATA:  Acute tibial plateau fracture, right knee pain. Fall carrying [employer]. EXAM: CT OF THE RIGHT KNEE WITHOUT CONTRAST TECHNIQUE: Multidetector CT imaging of the RIGHT knee was performed according to the standard protocol. Multiplanar CT image reconstructions were also generated. COMPARISON:  Radiograph earlier this day FINDINGS: Bones/Joint/Cartilage Acute comminuted lateral tibial plateau fracture. Mild displacement involving the anterior-most cortex. Only minimal articular surface depression, less than 1 mm. Fracture extends to the tibial spines posteriorly. No vertically-oriented component. No metaphyseal component. Moderate lipohemarthrosis. No additional fracture of the patella, distal femur, or proximal fibula. Medial and lateral tibiofemoral chondrocalcinosis incidentally noted. Ligaments Suboptimally assessed by CT.  Possible ACL thickening. Muscles and Tendons No intramuscular hematoma. Quadriceps and patellar tendons are intact. Soft tissues Soft tissue edema about the knee. IMPRESSION: Schatzker type 3 minimally depressed lateral tibial plateau fracture. Electronically Signed   By: Jeb Levering M.D.   On: 07/16/2017 21:22   Chest Portable 1 View  Result Date: 07/17/2017 CLINICAL DATA:  66 year old female with intermittent cough. Preoperative study. EXAM: PORTABLE CHEST 1 VIEW COMPARISON:  06/20/2011. FINDINGS: Portable AP semi upright view at 1339 hours. Lower lung volumes. Mediastinal contours are stable. Cardiac size at the upper limits of normal. Visualized tracheal air column is within normal limits. Allowing for portable technique the lungs are clear. No pneumothorax or pleural effusion. Partially calcified left breast implant. IMPRESSION: No acute cardiopulmonary abnormality. Electronically Signed   By: Genevie Ann M.D.   On: 07/17/2017 14:20   Dg Knee Complete 4 Views Right  Result Date: 07/16/2017 CLINICAL DATA:  Recent fall downstairs, generalized right knee pain EXAM: RIGHT KNEE - COMPLETE 4+ VIEW COMPARISON:  None available FINDINGS: Mild  degenerative changes noted with chondrocalcinosis. Slight osteopenia. Joint effusion noted on the lateral view. Acute minimally depressed lateral tibial plateau fracture noted on the oblique views. IMPRESSION: Acute minimally depressed lateral tibial plateau fracture and likely an associated right knee effusion/ hemarthrosis. Right knee degenerative change with chondrocalcinosis. Electronically Signed   By: Jerilynn Mages.  Shick M.D.   On: 07/16/2017 18:17     Scheduled Meds: . acetaminophen  1,000 mg Oral Once  . [MAR Hold] cefUROXime  250 mg Oral BID WC  . chlorhexidine  60 mL Topical Once  . [MAR Hold] cholecalciferol  5,000 Units Oral Daily  . [MAR Hold] docusate sodium  100 mg Oral BID  . [MAR Hold] gabapentin  200 mg Oral Daily  . [MAR Hold] gabapentin  300 mg Oral QHS  . [MAR Hold] heparin  5,000 Units Subcutaneous Q8H  . [MAR Hold] hydroxychloroquine  200 mg Oral Daily  . [MAR Hold] losartan  100 mg Oral Daily  . [MAR Hold] metoprolol succinate  50 mg Oral BID  . [MAR Hold] sulfaSALAzine  1,000 mg Oral BID WC   Continuous Infusions: . [START ON 07/19/2017]  ceFAZolin (ANCEF) IV    .  dextrose 5 % and 0.45% NaCl    . lactated ringers 1,000 mL (07/18/17 1129)     LOS: 2 days   Time Spent in minutes   30 minutes  Bryan Omura D.O. on 07/18/2017 at 12:26 PM  Between 7am to 7pm - Pager - [phone]  After 7pm go to [email or web address] - password [email or web address]  And look for the night coverage person covering for me after hours  [facility] Office  [phone]

## 2017-07-18 NOTE — Transfer of Care (Signed)
Immediate Anesthesia Transfer of Care Note  Patient: [name]  Procedure(s) Performed: OPEN REDUCTION INTERNAL FIXATION (ORIF) WRIST FRACTURE (Right )  Patient Location: PACU  Anesthesia Type:General and GA combined with regional for post-op pain  Level of Consciousness: drowsy and patient cooperative  Airway & Oxygen Therapy: Patient Spontanous Breathing and Patient connected to nasal cannula oxygen  Post-op Assessment: Report given to RN and Post -op Vital signs reviewed and stable  Post vital signs: Reviewed and stable  Last Vitals:  Vitals:   07/18/17 0359 07/18/17 1442  BP: (!) 121/48   Pulse: 97   Resp: 19   Temp: 37.7 C (P) 36.6 C  SpO2: 93%     Last Pain:  Vitals:   07/18/17 1104  TempSrc:   PainSc: 5       Patients Stated Pain Goal: 0 (76/22/63 3354)  Complications: No apparent anesthesia complications

## 2017-07-18 NOTE — Anesthesia Preprocedure Evaluation (Addendum)
Anesthesia Evaluation  Patient identified by MRN, date of birth, ID band Patient awake    Reviewed: Allergy & Precautions, NPO status , Patient's Chart, lab work & pertinent test results, reviewed documented beta blocker date and time   History of Anesthesia Complications (+) PONV  Airway Mallampati: III  TM Distance: <3 FB Neck ROM: Full    Dental  (+) Dental Advisory Given, Implants, Caps, Partial Lower, Partial Upper   Pulmonary former smoker (quit 1985),    breath sounds clear to auscultation       Cardiovascular hypertension, Pt. on medications and Pt. on home beta blockers (-) angina Rhythm:Regular Rate:Normal  5/18 ECHO: EF 55-60%, valves OK   Neuro/Psych Anxiety  Neuromuscular disease (peripheral neuropathy)    GI/Hepatic Neg liver ROS, GERD  Medicated and Poorly Controlled,  Endo/Other  Morbid obesity  Renal/GU negative Renal ROS     Musculoskeletal  (+) Arthritis , Rheumatoid disorders,    Abdominal (+) + obese,   Peds  Hematology negative hematology ROS (+)   Anesthesia Other Findings   Reproductive/Obstetrics                            Anesthesia Physical Anesthesia Plan  ASA: III  Anesthesia Plan: General   Post-op Pain Management: GA combined w/ Regional for post-op pain   Induction: Intravenous  PONV Risk Score and Plan: 4 or greater and Ondansetron, Dexamethasone and Midazolam  Airway Management Planned: Oral ETT and Video Laryngoscope Planned  Additional Equipment:   Intra-op Plan:   Post-operative Plan: Extubation in OR  Informed Consent: I have reviewed the patients History and Physical, chart, labs and discussed the procedure including the risks, benefits and alternatives for the proposed anesthesia with the patient or authorized representative who has indicated his/her understanding and acceptance.   Dental advisory given  Plan Discussed with: CRNA,  Surgeon and Anesthesiologist  Anesthesia Plan Comments: (Plan routine monitors, GETA with supraclavicular block for post op analgesia.   Pt nauseated presently)       Anesthesia Quick Evaluation

## 2017-07-18 NOTE — Interval H&P Note (Signed)
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 After consideration of risks, benefits and other options for treatment, the patient has consented to  Procedure(s): OPEN REDUCTION INTERNAL FIXATION (ORIF) WRIST FRACTURE (Right) as a surgical intervention .  The patient's history has been reviewed, patient examined, no change in status, stable for surgery.  I have reviewed the patient's chart and labs.  Questions were answered to the patient's satisfaction.     Jerrad Mendibles D

## 2017-07-19 LAB — BASIC METABOLIC PANEL
Anion gap: 6 (ref 5–15)
BUN: 15 mg/dL (ref 6–20)
CHLORIDE: 101 mmol/L (ref 101–111)
CO2: 29 mmol/L (ref 22–32)
CREATININE: 0.92 mg/dL (ref 0.44–1.00)
Calcium: 9.6 mg/dL (ref 8.9–10.3)
GFR calc Af Amer: >60 mL/min (ref >60)
GFR calc non Af Amer: >60 mL/min (ref >60)
GLUCOSE: 109 mg/dL — AB (ref 65–99)
Potassium: 3.9 mmol/L (ref 3.5–5.1)
SODIUM: 136 mmol/L (ref 135–145)

## 2017-07-19 LAB — CBC
HCT: 32.7 % — ABNORMAL LOW (ref 36.0–46.0)
HEMOGLOBIN: 10.1 g/dL — AB (ref 12.0–15.0)
MCH: 23.5 pg — AB (ref 26.0–34.0)
MCHC: 30.9 g/dL (ref 30.0–36.0)
MCV: 76.2 fL — AB (ref 78.0–100.0)
PLATELETS: 178 10*3/uL (ref 150–400)
RBC: 4.29 MIL/uL (ref 3.87–5.11)
RDW: 14.5 % (ref 11.5–15.5)
WBC: 6.8 10*3/uL (ref 4.0–10.5)

## 2017-07-19 MED ORDER — BISACODYL 5 MG PO TBEC
5.0000 mg | DELAYED_RELEASE_TABLET | Freq: Once | ORAL | Status: AC
  Administered 2017-07-19: 5 mg via ORAL
  Filled 2017-07-19: qty 1

## 2017-07-19 NOTE — Progress Notes (Signed)
PROGRESS NOTE    [name]  [name] DOB: 1951-07-20 DOA: 07/16/2017 PCP: Lucretia Kern, DO   Chief Complaint  Patient presents with  . Fall  . Facial Injury  . Epistaxis  . Wrist Pain    right  . Knee Pain    right    Brief Narrative:  HPI On 07/16/2017 by Dr. Karmen Bongo [name] is a 66 y.o. female with medical history significant of RA; pseudogout; HTN; and GERD presenting following a fall.    Patient was carrying laundry down the stairs.  She was about 2-3 steps down and she slipped.  She lost her footing and went headfirst down the stairs.  She slid on her chest and rammed her face into the floor. She injured her left face and right wrist and knee.  Her hip was hurting a bit but that seems to be okay.  She has a h/o swollen LM in her left submandibular neck years ago and it recurred recently.  She also had hypercalcemia; she was placed on vitamin D for vitamin D deficiency.  She saw ENT twice including yesterday and he has placed her on 2 consecutive antibiotics.  Assessment & Plan   Fall with multiple fractures -Patient noted to have maxillary fracture, likely to require surgical intervention -Right Radial fracture noted, currently splinted- plan for surgery today -Right Tibial plateau fracture, currently an immobilizer -Orthopedics consulted and appreciated -On 07/17/2017, Discussed with Dr. Fredonia Highland, who stated that she possibly have surgical intervention on her wrist on 07/18/2017. May not need surgical intervention on tibial plateau fracture. -S/p ORIF right wrist  -PT recommended SNF -Continue pain control and antiemetics as needed -continue pain control with IV toradol, morphine  Neck mass -Patient follows with Dr. Claria Dice. Possibly reactive lymph node -Continue Ceftin  Hyperparathyroidism -Followed by PCP and endocrinology -Continue vitamin D supplementation -TSH recently checked 1 week ago and was normal  Essential  hypertension -Continue Toprol, Cozaar -Lasix held  Microcytic anemia -hemoglobin currently 10.1 -will continue to monitor CBC  RA -Continue Plaquenil, sulfasalazine  DVT Prophylaxis  Heparin  Code Status: Full  Family Communication: Husband at bedside  Disposition Plan: Admitted. Continue pain control, likely will need SNF vs Home with home health  Consultants Orthopedics  Procedures  None  Antibiotics   Anti-infectives (From admission, onward)   Start     Dose/Rate Route Frequency Ordered Stop   07/19/17 0600  ceFAZolin (ANCEF) IVPB 2g/100 mL premix     2 g 200 mL/hr over 30 Minutes Intravenous On call to O.R. 07/18/17 1224 07/18/17 1347   07/17/17 1000  hydroxychloroquine (PLAQUENIL) tablet 200 mg    Comments:  Patient is currently taking     200 mg Oral Daily 07/17/17 0628     07/17/17 0800  cefUROXime (CEFTIN) tablet 250 mg     250 mg Oral 2 times daily with meals 07/17/17 0628 07/20/17 0759      Subjective:   [name] seen and examined today.  Continues to feel sore. Would like to go home with home health, but would like more information. Denies chest pain, shortness breath, abdominal pain, nausea vomiting, diarrhea or constipation.  Objective:   Vitals:   07/19/17 0100 07/19/17 0502 07/19/17 1050 07/19/17 1300  BP:  (!) 159/70 (!) 138/54 140/61  Pulse: 62 60 68 70  Resp:   16 16  Temp:  98.8 F (37.1 C)  98.1 F (36.7 C)  TempSrc:  Oral  Oral  SpO2: 94% 97% 97% 100%  Weight:      Height:        Intake/Output Summary (Last 24 hours) at 07/19/2017 1518 Last data filed at 07/19/2017 1200 Gross per 24 hour  Intake -  Output 500 ml  Net -500 ml   Filed Weights   07/18/17 1100  Weight: 107 kg (235 lb 14.3 oz)   Exam  General: Well developed, well nourished, NAD, appears stated age  HEENT: NCAT, mucous membranes moist. Facial bruising   Cardiovascular: S1 S2 auscultated, RRR, no murmurs  Respiratory: Clear to auscultation bilaterally  with equal chest rise  Abdomen: Soft, nontender, nondistended, + bowel sounds  Extremities: warm dry without cyanosis clubbing or edema. RLE in [place]. RUE wrapped  Neuro: AAOx3, nonfocal  Psych: Appropriate mood and affect, pleasant  Data Reviewed: I have personally reviewed following labs and imaging studies  CBC: Recent Labs  Lab 07/17/17 1202 07/18/17 0359 07/18/17 1245 07/19/17 0540  WBC 9.2 6.4 7.1 6.8  NEUTROABS  --   --  5.3  --   HGB 11.3* 10.3* 10.6* 10.1*  HCT 36.0 33.0* 34.1* 32.7*  MCV 75.5* 75.5* 76.8* 76.2*  PLT 197 192 187 983   Basic Metabolic Panel: Recent Labs  Lab 07/17/17 1202 07/18/17 0359 07/18/17 1245 07/19/17 0540  NA 135 133* 134* 136  K 3.5 3.7 3.9 3.9  CL 102 101 101 101  CO2 27 26 27 29   GLUCOSE 118* 109* 101* 109*  BUN 12 15 16 15   CREATININE 0.86 0.93 0.96 0.92  CALCIUM 10.0 9.5 9.5 9.6   GFR: Estimated Creatinine Clearance: 72.4 mL/min (by C-G formula based on SCr of 0.92 mg/dL). Liver Function Tests: Recent Labs  Lab 07/18/17 1245  AST 17  ALT 20  ALKPHOS 88  BILITOT 0.6  PROT 6.0*  ALBUMIN 3.2*   No results for input(s): LIPASE, AMYLASE in the last 168 hours. No results for input(s): AMMONIA in the last 168 hours. Coagulation Profile: Recent Labs  Lab 07/18/17 1245  INR 1.05   Cardiac Enzymes: No results for input(s): CKTOTAL, CKMB, CKMBINDEX, TROPONINI in the last 168 hours. BNP (last 3 results) No results for input(s): PROBNP in the last 8760 hours. HbA1C: No results for input(s): HGBA1C in the last 72 hours. CBG: No results for input(s): GLUCAP in the last 168 hours. Lipid Profile: No results for input(s): CHOL, HDL, LDLCALC, TRIG, CHOLHDL, LDLDIRECT in the last 72 hours. Thyroid Function Tests: No results for input(s): TSH, T4TOTAL, FREET4, T3FREE, THYROIDAB in the last 72 hours. Anemia Panel: No results for input(s): VITAMINB12, FOLATE, FERRITIN, TIBC, IRON, RETICCTPCT in the last 72 hours. Urine  analysis:    Component Value Date/Time   COLORURINE YELLOW 04/29/2014 [place] 04/29/2014 1244   LABSPEC 1.010 04/29/2014 1244   PHURINE 5.5 04/29/2014 [place] 04/29/2014 [place] 04/29/2014 1244   BILIRUBINUR NEGATIVE 04/29/2014 1244   KETONESUR NEGATIVE 04/29/2014 1244   PROTEINUR NEGATIVE 03/23/2010 1124   UROBILINOGEN 0.2 04/29/2014 1244   NITRITE NEGATIVE 04/29/2014 1244   LEUKOCYTESUR NEGATIVE 04/29/2014 1244   Sepsis Labs: @LABRCNTIP (procalcitonin:4,lacticidven:4)  ) Recent Results (from the past 240 hour(s))  Surgical pcr screen     Status: None   Collection Time: 07/18/17  6:18 AM  Result Value Ref Range Status   MRSA, PCR NEGATIVE NEGATIVE Final   Staphylococcus aureus NEGATIVE NEGATIVE Final    Comment: (NOTE) The Xpert SA Assay (FDA approved for NASAL  specimens in patients 59 years of age and older), is one component of a comprehensive surveillance program. It is not intended to diagnose infection nor to guide or monitor treatment.       Radiology Studies: No results found.   Scheduled Meds: . cefUROXime  250 mg Oral BID WC  . cholecalciferol  5,000 Units Oral Daily  . docusate sodium  100 mg Oral BID  . gabapentin  200 mg Oral Daily  . gabapentin  300 mg Oral QHS  . heparin  5,000 Units Subcutaneous Q8H  . hydroxychloroquine  200 mg Oral Daily  . losartan  100 mg Oral Daily  . metoprolol succinate  50 mg Oral BID  . sulfaSALAzine  1,000 mg Oral BID WC   Continuous Infusions: . lactated ringers 10 mL/hr at 07/18/17 1244     LOS: 3 days   Time Spent in minutes   30 minutes  Dashawn Golda D.O. on 07/19/2017 at 3:18 PM  Between 7am to 7pm - Pager - [phone]  After 7pm go to [email or web address] - password [email or web address]  And look for the night coverage person covering for me after hours  [facility] Office  [phone]

## 2017-07-19 NOTE — Progress Notes (Signed)
SPORTS MEDICINE AND JOINT REPLACEMENT  Lara Mulch, MD    Carlyon Shadow, PA-C [place], [place],   [address]                             [phone]   PROGRESS NOTE  Subjective:  negative for Chest Pain  negative for Shortness of Breath  negative for Nausea/Vomiting   negative for Calf Pain  negative for Bowel Movement   Tolerating Diet: yes         Patient reports pain as 5 on 0-10 scale.    Objective: Vital signs in last 24 hours:    Patient Vitals for the past 24 hrs:  BP Temp Temp src Pulse Resp SpO2 Height Weight  07/19/17 0502 (!) 159/70 98.8 F (37.1 C) Oral 60 - 97 % - -  07/19/17 0100 - - - 62 - 94 % - -  07/18/17 1956 (!) 181/85 98.7 F (37.1 C) Oral 81 - 94 % - -  07/18/17 1615 (!) 163/74 98.3 F (36.8 C) Oral 81 18 93 % - -  07/18/17 1530 120/69 (!) 97 F (36.1 C) - 83 18 91 % - -  07/18/17 1515 134/74 - - 79 (!) 22 (!) 89 % - -  07/18/17 1500 130/62 - - 74 15 93 % - -  07/18/17 1445 114/60 - - 77 14 (!) 89 % - -  07/18/17 1442 - 97.8 F (36.6 C) - 74 11 (!) 81 % - -  07/18/17 1100 - - - - - - 5\' 6"  (1.676 m) 107 kg (235 lb 14.3 oz)    @flow {1959:LAST@   Intake/Output from previous day:   11/30 0701 - 12/01 0700 In: 850 [I.V.:800] Out: -    Intake/Output this shift:   No intake/output data recorded.   Intake/Output      11/30 0701 - 12/01 0700 12/01 0701 - 12/02 0700   P.O. 0    I.V. (mL/kg) 800 (7.5)    IV Piggyback 50    Total Intake(mL/kg) 850 (7.9)    Urine (mL/kg/hr)     Total Output     Net +850            LABORATORY DATA: Recent Labs    07/17/17 1202 07/18/17 0359 07/18/17 1245 07/19/17 0540  WBC 9.2 6.4 7.1 6.8  HGB 11.3* 10.3* 10.6* 10.1*  HCT 36.0 33.0* 34.1* 32.7*  PLT 197 192 187 178   Recent Labs    07/17/17 1202 07/18/17 0359 07/18/17 1245 07/19/17 0540  NA 135 133* 134* 136  K 3.5 3.7 3.9 3.9  CL 102 101 101 101  CO2 27 26 27 29   BUN 12 15 16 15   CREATININE 0.86 0.93 0.96 0.92   GLUCOSE 118* 109* 101* 109*  CALCIUM 10.0 9.5 9.5 9.6   Lab Results  Component Value Date   INR 1.05 07/18/2017   INR 0.99 12/20/2015    Examination:  General appearance: alert, cooperative and no distress Extremities: extremities normal, atraumatic, no cyanosis or edema  Wound Exam: clean, dry, intact   Drainage:  None: wound tissue dry  Motor Exam: Opposition, Pinch, Wrist Dorsiflexion, Quadriceps and Hamstrings Intact  Sensory Exam: Radial, Ulnar, Superficial Peroneal, Deep Peroneal and Tibial normal   Assessment:    1 Day Post-Op  Procedure(s) (LRB): OPEN REDUCTION INTERNAL FIXATION (ORIF) WRIST FRACTURE (Right)  ADDITIONAL DIAGNOSIS:  Principal Problem:   Fall at home, initial  encounter Active Problems:   Rheumatoid arthritis ([place])   Hypertension   Neck mass   Hyperparathyroidism ([place])   Tibial plateau fracture, right, closed, initial encounter   Left maxillary fracture ([place])   Closed right radial fracture     Plan: Physical Therapy as ordered Non Weight Bearing (NWB)  DVT Prophylaxis:  Aspirin   Patient ate a full breakfast and reports an improvement in pain since last night. Ortho will continue to follow until patient D/C by medicine           Donia Ast 07/19/2017, 9:07 AM

## 2017-07-20 ENCOUNTER — Encounter (HOSPITAL_COMMUNITY): Payer: Self-pay | Admitting: Orthopedic Surgery

## 2017-07-20 LAB — BASIC METABOLIC PANEL
Anion gap: 6 (ref 5–15)
BUN: 21 mg/dL — AB (ref 6–20)
CHLORIDE: 101 mmol/L (ref 101–111)
CO2: 28 mmol/L (ref 22–32)
Calcium: 9.2 mg/dL (ref 8.9–10.3)
Creatinine, Ser: 0.92 mg/dL (ref 0.44–1.00)
Glucose, Bld: 104 mg/dL — ABNORMAL HIGH (ref 65–99)
POTASSIUM: 3.9 mmol/L (ref 3.5–5.1)
SODIUM: 135 mmol/L (ref 135–145)

## 2017-07-20 LAB — CBC
HCT: 32.4 % — ABNORMAL LOW (ref 36.0–46.0)
HEMOGLOBIN: 10 g/dL — AB (ref 12.0–15.0)
MCH: 23.6 pg — AB (ref 26.0–34.0)
MCHC: 30.9 g/dL (ref 30.0–36.0)
MCV: 76.4 fL — ABNORMAL LOW (ref 78.0–100.0)
PLATELETS: 179 10*3/uL (ref 150–400)
RBC: 4.24 MIL/uL (ref 3.87–5.11)
RDW: 14.7 % (ref 11.5–15.5)
WBC: 6.2 10*3/uL (ref 4.0–10.5)

## 2017-07-20 NOTE — Evaluation (Signed)
Occupational Therapy Evaluation Patient Details Name: [name] MRN: [id number] DOB: 05-08-1951 Today's Date: 07/20/2017    History of Present Illness Pt is a 66 y/o female who presents s/p fall down the stairs at home. Pt sustained a R tibial plateau fracture and a R distal radius fracture. She is NWB through the R wrist but is Ok to bear weight through the elbow, and NWB on the RLE. PMH significant for L1-S1 PLIF 09/2016.   Clinical Impression   PTA pt independent in ADL and mobility. Pt is currently max A for LB ADL and min +2 for bed mobility. Pt reports performing BSC transfers with RN but did not attempt this session with OT. Please see OT problem list below. Pt is very pleasant and motivated to work with therapy to return to PLOF. Pt will benefit from skilled OT in the acute setting and afterwards at SNF level to maximize safety and independence in ADL. Next session to focus on [facility] transfer.    Follow Up Recommendations  SNF    Equipment Recommendations  Other (comment)(defer to next venue (Pt has a lot of DME from back sx))    Recommendations for Other Services       Precautions / Restrictions Precautions Precautions: Fall Required Braces or Orthoses: Knee Immobilizer - Right Knee Immobilizer - Right: On when out of bed or walking Restrictions Weight Bearing Restrictions: Yes RUE Weight Bearing: Weight bear through elbow only RLE Weight Bearing: Non weight bearing Other Position/Activity Restrictions: Must have KI donned for mobility      Mobility Bed Mobility Overal bed mobility: Needs Assistance Bed Mobility: Supine to Sit;Sit to Supine     Supine to sit: Min assist;HOB elevated(min A for bed pad to bring hips EOB, support for LE off bed) Sit to supine: Mod assist;HOB elevated;+2 for safety/equipment(mod A for LE back into bed, use of bed pads to assist hips)   General bed mobility comments: greatly improved from previous PT session  Transfers                  General transfer comment: Pt declined as there are not enough chairs for her visitors at this time. Pt reports that she has been using the [facility], and has been twice.    Balance Overall balance assessment: Needs assistance Sitting-balance support: Single extremity supported;Feet supported Sitting balance-Leahy Scale: Fair Sitting balance - Comments: able to sit EOB with supervision for visual testing                                   ADL either performed or assessed with clinical judgement   ADL Overall ADL's : Needs assistance/impaired Eating/Feeding: Set up   Grooming: Set up;Sitting Grooming Details (indicate cue type and reason): Pt can put on full makeup including eyeliner, mascara - the works Upper Body Bathing: Minimal assistance   Lower Body Bathing: Moderate assistance   Upper Body Dressing : Min guard   Lower Body Dressing: Maximal assistance   Toilet Transfer: +2 for safety/equipment   Toileting- Clothing Manipulation and Hygiene: Maximal assistance       Functional mobility during ADLs: (deferred this session) General ADL Comments: Pt very determined and motivated to work with therapy     Vision Baseline Vision/History: Wears glasses Wears Glasses: Reading only Patient Visual Report: Other (comment)(sometimes "unfocused" in the morning since fall) Vision Assessment?: Yes Eye Alignment: Within Functional Limits Ocular Range of  Motion: Within Functional Limits Alignment/Gaze Preference: Within Defined Limits Tracking/Visual Pursuits: Able to track stimulus in all quads without difficulty Convergence: Within functional limits(slight deficit, however Pt has reading glasses at baseline) Visual Fields: No apparent deficits Diplopia Assessment: (denies) Additional Comments: vision WFL this session, Pt advised if blurryness continues in AM to follow up with MD     Perception     Praxis      Pertinent Vitals/Pain Pain Assessment:  Faces Faces Pain Scale: Hurts a little bit Pain Location: knee, wrist Pain Descriptors / Indicators: Discomfort;Aching Pain Intervention(s): Monitored during session;Repositioned;Ice applied     Hand Dominance Right   Extremity/Trunk Assessment Upper Extremity Assessment Upper Extremity Assessment: RUE deficits/detail RUE Deficits / Details: NWB through wrist in splint RUE: Unable to fully assess due to immobilization RUE Coordination: decreased fine motor   Lower Extremity Assessment Lower Extremity Assessment: Defer to PT evaluation   Cervical / Trunk Assessment Cervical / Trunk Assessment: Normal   Communication Communication Communication: No difficulties   Cognition Arousal/Alertness: Awake/alert Behavior During Therapy: WFL for tasks assessed/performed Overall Cognitive Status: Within Functional Limits for tasks assessed                                     General Comments  Pt educated in elevation, edema, movement management of RUE    Exercises     Shoulder Instructions      Home Living Family/patient expects to be discharged to:: Skilled nursing facility Living Arrangements: Spouse/significant other Available Help at Discharge: Family;Available 24 hours/day Type of Home: House Home Access: Level entry     Home Layout: Two level;1/2 bath on main level Alternate Level Stairs-Number of Steps: 13 Alternate Level Stairs-Rails: Right Bathroom Shower/Tub: Walk-in shower;Tub only   Bathroom Toilet: Handicapped height     Home Equipment: Walker - 2 wheels          Prior Functioning/Environment Level of Independence: Independent        Comments: Pt reports she was independent including driving         OT Problem List: Decreased strength;Decreased range of motion;Decreased activity tolerance;Impaired balance (sitting and/or standing);Decreased safety awareness;Decreased knowledge of use of DME or AE;Decreased knowledge of  precautions;Obesity;Impaired UE functional use;Pain      OT Treatment/Interventions: Self-care/ADL training;Energy conservation;DME and/or AE instruction;Therapeutic activities;Patient/family education;Balance training    OT Goals(Current goals can be found in the care plan section) Acute Rehab OT Goals Patient Stated Goal: get stronger OT Goal Formulation: With patient Time For Goal Achievement: 08/03/17 Potential to Achieve Goals: Good ADL Goals Pt Will Perform Lower Body Bathing: with supervision;with adaptive equipment;sitting/lateral leans;with caregiver independent in assisting Pt Will Perform Lower Body Dressing: with min guard assist;with caregiver independent in assisting;with adaptive equipment;sit to/from stand Pt Will Transfer to Toilet: with supervision;stand pivot transfer;bedside commode Pt Will Perform Toileting - Clothing Manipulation and hygiene: with mod assist;with caregiver independent in assisting;sit to/from stand Additional ADL Goal #1: Pt will perform bed mobility at supervision level prior to starting ADL activity  OT Frequency: Min 2X/week   Barriers to D/C: Inaccessible home environment  Pt has to go up a flight of stairs       Co-evaluation              AM-PAC PT "6 Clicks" Daily Activity     Outcome Measure Help from another person eating meals?: A Little Help from another person taking care  of personal grooming?: A Little Help from another person toileting, which includes using toliet, bedpan, or urinal?: A Lot Help from another person bathing (including washing, rinsing, drying)?: A Lot Help from another person to put on and taking off regular upper body clothing?: A Lot Help from another person to put on and taking off regular lower body clothing?: A Lot 6 Click Score: 14   End of Session Nurse Communication: Mobility status;Other (comment)(purewick taken out)  Activity Tolerance: Patient tolerated treatment well Patient left: in bed;with  call bell/phone within reach;with bed alarm set;with family/visitor present;with SCD's reapplied  OT Visit Diagnosis: Unsteadiness on feet (R26.81);Other abnormalities of gait and mobility (R26.89);History of falling (Z91.81);Pain Pain - Right/Left: Right Pain - part of body: Hand;Leg                Time: 1420-1455 OT Time Calculation (min): 35 min Charges:  OT General Charges $OT Visit: 1 Visit OT Evaluation $OT Eval Moderate Complexity: 1 Mod OT Treatments $Self Care/Home Management : 8-22 mins G-Codes:     Hulda Humphrey OTR/L [place] 07/20/2017, 3:27 PM

## 2017-07-20 NOTE — Plan of Care (Signed)
  Clinical Measurements: Will remain free from infection 07/20/2017 1053 - Progressing by Williams Che, RN   Coping: Level of anxiety will decrease 07/20/2017 1053 - Progressing by Williams Che, RN   Pain Managment: General experience of comfort will improve 07/20/2017 1053 - Progressing by Williams Che, RN   Safety: Ability to remain free from injury will improve 07/20/2017 1053 - Progressing by Williams Che, RN

## 2017-07-20 NOTE — Progress Notes (Signed)
PROGRESS NOTE    [name]  RJJ:[id number] DOB: Jun 16, 1951 DOA: 07/16/2017 PCP: Lucretia Kern, DO   Chief Complaint  Patient presents with  . Fall  . Facial Injury  . Epistaxis  . Wrist Pain    right  . Knee Pain    right    Brief Narrative:  HPI On 07/16/2017 by Dr. Karmen Bongo [name] is a 66 y.o. female with medical history significant of RA; pseudogout; HTN; and GERD presenting following a fall.    Patient was carrying laundry down the stairs.  She was about 2-3 steps down and she slipped.  She lost her footing and went headfirst down the stairs.  She slid on her chest and rammed her face into the floor. She injured her left face and right wrist and knee.  Her hip was hurting a bit but that seems to be okay.  She has a h/o swollen LM in her left submandibular neck years ago and it recurred recently.  She also had hypercalcemia; she was placed on vitamin D for vitamin D deficiency.  She saw ENT twice including yesterday and he has placed her on 2 consecutive antibiotics.  Assessment & Plan   Fall with multiple fractures -Patient noted to have maxillary fracture, likely to require surgical intervention -Right Radial fracture noted, currently splinted- plan for surgery today -Right Tibial plateau fracture, currently an immobilizer -Orthopedics consulted and appreciated -On 07/17/2017, Discussed with Dr. Fredonia Highland, who stated that she possibly have surgical intervention on her wrist on 07/18/2017. May not need surgical intervention on tibial plateau fracture. -S/p ORIF right wrist  -PT recommended SNF -had a long discussion with patient and husband, they have now opted to look into rehab -Continue pain control and antiemetics as needed -continue pain control with IV toradol, morphine -social work consulted for SNF placement   Neck mass -Patient follows with Dr. Claria Dice. Possibly reactive lymph node -Completed Ceftin  course  Hyperparathyroidism -Followed by PCP and endocrinology -Continue vitamin D supplementation -TSH recently checked 1 week ago and was normal  Essential hypertension -Continue Toprol, Cozaar -Lasix held  Microcytic anemia -hemoglobin currently 10 -will continue to monitor CBC  RA -Continue Plaquenil, sulfasalazine  DVT Prophylaxis  Heparin  Code Status: Full  Family Communication: Husband at bedside  Disposition Plan: Admitted. Continue pain control, likely will need SNF   Consultants Orthopedics  Procedures  None  Antibiotics   Anti-infectives (From admission, onward)   Start     Dose/Rate Route Frequency Ordered Stop   07/19/17 0600  ceFAZolin (ANCEF) IVPB 2g/100 mL premix     2 g 200 mL/hr over 30 Minutes Intravenous On call to O.R. 07/18/17 1224 07/18/17 1347   07/17/17 1000  hydroxychloroquine (PLAQUENIL) tablet 200 mg    Comments:  Patient is currently taking     200 mg Oral Daily 07/17/17 0628     07/17/17 0800  cefUROXime (CEFTIN) tablet 250 mg     250 mg Oral 2 times daily with meals 07/17/17 0628 07/20/17 0759      Subjective:   [name] seen and examined today.  Continues to feel sore, but is feeling mildly better. Denies chest pain, shortness of breath, abdominal pain, N/V/D/C. Feels her appetite is getting better.   Objective:   Vitals:   07/19/17 1300 07/19/17 1945 07/20/17 0540 07/20/17 1320  BP: 140/61 (!) 137/55 (!) 145/57 (!) 162/72  Pulse: 70 76 72 64  Resp: 16  18 18   Temp:  98.1 F (36.7 C) 98.4 F (36.9 C) 99.3 F (37.4 C) 98.2 F (36.8 C)  TempSrc: Oral Oral Oral Oral  SpO2: 100% 92% 93% 99%  Weight:      Height:        Intake/Output Summary (Last 24 hours) at 07/20/2017 1327 Last data filed at 07/20/2017 0540 Gross per 24 hour  Intake -  Output 300 ml  Net -300 ml   Filed Weights   07/18/17 1100  Weight: 107 kg (235 lb 14.3 oz)   Exam  General: Well developed, well nourished, NAD, appears stated  age  HEENT: NCAT, mucous membranes moist. Facial bruising  Cardiovascular: S1 S2 auscultated, RRR, no murmurs  Respiratory: Clear to auscultation bilaterally with equal chest rise  Abdomen: Soft, nontender, nondistended, + bowel sounds  Extremities: warm dry without cyanosis clubbing or edema. RLE in [place], RUE wrapped  Neuro: AAOx3, nonfocal  Psych: Normal affect and demeanor, pleasant   Data Reviewed: I have personally reviewed following labs and imaging studies  CBC: Recent Labs  Lab 07/17/17 1202 07/18/17 0359 07/18/17 1245 07/19/17 0540 07/20/17 0529  WBC 9.2 6.4 7.1 6.8 6.2  NEUTROABS  --   --  5.3  --   --   HGB 11.3* 10.3* 10.6* 10.1* 10.0*  HCT 36.0 33.0* 34.1* 32.7* 32.4*  MCV 75.5* 75.5* 76.8* 76.2* 76.4*  PLT 197 192 187 178 761   Basic Metabolic Panel: Recent Labs  Lab 07/17/17 1202 07/18/17 0359 07/18/17 1245 07/19/17 0540 07/20/17 0529  NA 135 133* 134* 136 135  K 3.5 3.7 3.9 3.9 3.9  CL 102 101 101 101 101  CO2 27 26 27 29 28   GLUCOSE 118* 109* 101* 109* 104*  BUN 12 15 16 15  21*  CREATININE 0.86 0.93 0.96 0.92 0.92  CALCIUM 10.0 9.5 9.5 9.6 9.2   GFR: Estimated Creatinine Clearance: 72.4 mL/min (by C-G formula based on SCr of 0.92 mg/dL). Liver Function Tests: Recent Labs  Lab 07/18/17 1245  AST 17  ALT 20  ALKPHOS 88  BILITOT 0.6  PROT 6.0*  ALBUMIN 3.2*   No results for input(s): LIPASE, AMYLASE in the last 168 hours. No results for input(s): AMMONIA in the last 168 hours. Coagulation Profile: Recent Labs  Lab 07/18/17 1245  INR 1.05   Cardiac Enzymes: No results for input(s): CKTOTAL, CKMB, CKMBINDEX, TROPONINI in the last 168 hours. BNP (last 3 results) No results for input(s): PROBNP in the last 8760 hours. HbA1C: No results for input(s): HGBA1C in the last 72 hours. CBG: No results for input(s): GLUCAP in the last 168 hours. Lipid Profile: No results for input(s): CHOL, HDL, LDLCALC, TRIG, CHOLHDL, LDLDIRECT  in the last 72 hours. Thyroid Function Tests: No results for input(s): TSH, T4TOTAL, FREET4, T3FREE, THYROIDAB in the last 72 hours. Anemia Panel: No results for input(s): VITAMINB12, FOLATE, FERRITIN, TIBC, IRON, RETICCTPCT in the last 72 hours. Urine analysis:    Component Value Date/Time   COLORURINE YELLOW 04/29/2014 [place] 04/29/2014 1244   LABSPEC 1.010 04/29/2014 1244   PHURINE 5.5 04/29/2014 [place] 04/29/2014 [place] 04/29/2014 1244   BILIRUBINUR NEGATIVE 04/29/2014 1244   KETONESUR NEGATIVE 04/29/2014 1244   PROTEINUR NEGATIVE 03/23/2010 1124   UROBILINOGEN 0.2 04/29/2014 1244   NITRITE NEGATIVE 04/29/2014 1244   LEUKOCYTESUR NEGATIVE 04/29/2014 1244   Sepsis Labs: @LABRCNTIP (procalcitonin:4,lacticidven:4)  ) Recent Results (from the past 240 hour(s))  Surgical pcr screen  Status: None   Collection Time: 07/18/17  6:18 AM  Result Value Ref Range Status   MRSA, PCR NEGATIVE NEGATIVE Final   Staphylococcus aureus NEGATIVE NEGATIVE Final    Comment: (NOTE) The Xpert SA Assay (FDA approved for NASAL specimens in patients 30 years of age and older), is one component of a comprehensive surveillance program. It is not intended to diagnose infection nor to guide or monitor treatment.       Radiology Studies: No results found.   Scheduled Meds: . cholecalciferol  5,000 Units Oral Daily  . docusate sodium  100 mg Oral BID  . gabapentin  200 mg Oral Daily  . gabapentin  300 mg Oral QHS  . heparin  5,000 Units Subcutaneous Q8H  . hydroxychloroquine  200 mg Oral Daily  . losartan  100 mg Oral Daily  . metoprolol succinate  50 mg Oral BID  . sulfaSALAzine  1,000 mg Oral BID WC   Continuous Infusions: . lactated ringers 10 mL/hr at 07/18/17 1244     LOS: 4 days   Time Spent in minutes   30 minutes  Hridhaan Yohn D.O. on 07/20/2017 at 1:27 PM  Between 7am to 7pm - Pager - [phone]  After 7pm go to  [email or web address] - password [email or web address]  And look for the night coverage person covering for me after hours  [facility] Office  [phone]

## 2017-07-21 LAB — CBC
HEMATOCRIT: 31.4 % — AB (ref 36.0–46.0)
Hemoglobin: 9.7 g/dL — ABNORMAL LOW (ref 12.0–15.0)
MCH: 23.7 pg — AB (ref 26.0–34.0)
MCHC: 30.9 g/dL (ref 30.0–36.0)
MCV: 76.6 fL — AB (ref 78.0–100.0)
Platelets: 185 10*3/uL (ref 150–400)
RBC: 4.1 MIL/uL (ref 3.87–5.11)
RDW: 14.7 % (ref 11.5–15.5)
WBC: 4.8 10*3/uL (ref 4.0–10.5)

## 2017-07-21 MED ORDER — GUAIFENESIN ER 600 MG PO TB12
600.0000 mg | ORAL_TABLET | Freq: Two times a day (BID) | ORAL | Status: DC | PRN
  Administered 2017-07-21 – 2017-07-23 (×3): 600 mg via ORAL
  Filled 2017-07-21 (×3): qty 1

## 2017-07-21 MED ORDER — HYDROCODONE-ACETAMINOPHEN 5-325 MG PO TABS: 1.0000 | ORAL_TABLET | Freq: Four times a day (QID) | ORAL | 0 refills | Status: DC | PRN

## 2017-07-21 NOTE — Progress Notes (Signed)
PROGRESS NOTE    [name]  PIR:[id number] DOB: February 10, 1951 DOA: 07/16/2017 PCP: Lucretia Kern, DO   Chief Complaint  Patient presents with  . Fall  . Facial Injury  . Epistaxis  . Wrist Pain    right  . Knee Pain    right    Brief Narrative:  HPI On 07/16/2017 by Dr. Karmen Bongo [name] is a 66 y.o. female with medical history significant of RA; pseudogout; HTN; and GERD presenting following a fall.    Patient was carrying laundry down the stairs.  She was about 2-3 steps down and she slipped.  She lost her footing and went headfirst down the stairs.  She slid on her chest and rammed her face into the floor. She injured her left face and right wrist and knee.  Her hip was hurting a bit but that seems to be okay.  She has a h/o swollen LM in her left submandibular neck years ago and it recurred recently.  She also had hypercalcemia; she was placed on vitamin D for vitamin D deficiency.  She saw ENT twice including yesterday and he has placed her on 2 consecutive antibiotics.  Assessment & Plan   Fall with multiple fractures -Patient noted to have maxillary fracture, likely to require surgical intervention -Right Radial fracture noted, currently splinted- plan for surgery today -Right Tibial plateau fracture, currently an immobilizer -Orthopedics consulted and appreciated -On 07/17/2017, Discussed with Dr. Fredonia Highland, who stated that she possibly have surgical intervention on her wrist on 07/18/2017. May not need surgical intervention on tibial plateau fracture. -S/p ORIF right wrist  -PT/OT recommended SNF -had a long discussion with patient and husband, they have now opted to look into rehab -Continue pain control and antiemetics as needed -continue pain control with IV toradol, morphine -social work consulted for SNF placement  -patient continues to go back and forth regarding home vs home health -case management also consulted  Neck mass -Patient  follows with Dr. Claria Dice. Possibly reactive lymph node -Completed Ceftin course  Hyperparathyroidism -Followed by PCP and endocrinology -Continue vitamin D supplementation -TSH recently checked 1 week ago and was normal  Essential hypertension -Continue Toprol, Cozaar -Lasix held  Microcytic anemia -hemoglobin currently 9.7 -will continue to monitor CBC  RA -Continue Plaquenil, sulfasalazine  DVT Prophylaxis  Heparin  Code Status: Full  Family Communication: Husband at bedside  Disposition Plan: Admitted. Continue pain control, likely will need SNF. Dispo pending pateint's decision  Consultants Orthopedics  Procedures  ORIF right wrist   Antibiotics   Anti-infectives (From admission, onward)   Start     Dose/Rate Route Frequency Ordered Stop   07/19/17 0600  ceFAZolin (ANCEF) IVPB 2g/100 mL premix     2 g 200 mL/hr over 30 Minutes Intravenous On call to O.R. 07/18/17 1224 07/18/17 1347   07/17/17 1000  hydroxychloroquine (PLAQUENIL) tablet 200 mg    Comments:  Patient is currently taking     200 mg Oral Daily 07/17/17 0628     07/17/17 0800  cefUROXime (CEFTIN) tablet 250 mg     250 mg Oral 2 times daily with meals 07/17/17 0628 07/20/17 0759      Subjective:   [name] seen and examined today.  Feeling mildly better. Denies chest pain, shortness of breath, abdominal pain, N/V/D/C.   Objective:   Vitals:   07/20/17 1320 07/20/17 2050 07/21/17 0652 07/21/17 1010  BP: (!) 162/72 (!) 162/63 (!) 158/62 (!) 157/84  Pulse: 64  63 61 63  Resp: 18 16 14    Temp: 98.2 F (36.8 C) 98.3 F (36.8 C)    TempSrc: Oral Oral    SpO2: 99% 94% 99%   Weight:      Height:       No intake or output data in the 24 hours ending 07/21/17 1545 Filed Weights   07/18/17 1100  Weight: 107 kg (235 lb 14.3 oz)   Exam (no change from PE on 07/20/2017)  General: Well developed, well nourished, NAD, appears stated age  HEENT: NCAT, mucous membranes moist. Facial  bruising  Cardiovascular: S1 S2 auscultated, RRR, no murmurs  Respiratory: Clear to auscultation bilaterally with equal chest rise  Abdomen: Soft, nontender, nondistended, + bowel sounds  Extremities: warm dry without cyanosis clubbing or edema. RLE in [place], RUE wrapped  Neuro: AAOx3, nonfocal  Psych: Normal affect and demeanor, pleasant   Data Reviewed: I have personally reviewed following labs and imaging studies  CBC: Recent Labs  Lab 07/18/17 0359 07/18/17 1245 07/19/17 0540 07/20/17 0529 07/21/17 0510  WBC 6.4 7.1 6.8 6.2 4.8  NEUTROABS  --  5.3  --   --   --   HGB 10.3* 10.6* 10.1* 10.0* 9.7*  HCT 33.0* 34.1* 32.7* 32.4* 31.4*  MCV 75.5* 76.8* 76.2* 76.4* 76.6*  PLT 192 187 178 179 867   Basic Metabolic Panel: Recent Labs  Lab 07/17/17 1202 07/18/17 0359 07/18/17 1245 07/19/17 0540 07/20/17 0529  NA 135 133* 134* 136 135  K 3.5 3.7 3.9 3.9 3.9  CL 102 101 101 101 101  CO2 27 26 27 29 28   GLUCOSE 118* 109* 101* 109* 104*  BUN 12 15 16 15  21*  CREATININE 0.86 0.93 0.96 0.92 0.92  CALCIUM 10.0 9.5 9.5 9.6 9.2   GFR: Estimated Creatinine Clearance: 72.4 mL/min (by C-G formula based on SCr of 0.92 mg/dL). Liver Function Tests: Recent Labs  Lab 07/18/17 1245  AST 17  ALT 20  ALKPHOS 88  BILITOT 0.6  PROT 6.0*  ALBUMIN 3.2*   No results for input(s): LIPASE, AMYLASE in the last 168 hours. No results for input(s): AMMONIA in the last 168 hours. Coagulation Profile: Recent Labs  Lab 07/18/17 1245  INR 1.05   Cardiac Enzymes: No results for input(s): CKTOTAL, CKMB, CKMBINDEX, TROPONINI in the last 168 hours. BNP (last 3 results) No results for input(s): PROBNP in the last 8760 hours. HbA1C: No results for input(s): HGBA1C in the last 72 hours. CBG: No results for input(s): GLUCAP in the last 168 hours. Lipid Profile: No results for input(s): CHOL, HDL, LDLCALC, TRIG, CHOLHDL, LDLDIRECT in the last 72 hours. Thyroid Function Tests: No  results for input(s): TSH, T4TOTAL, FREET4, T3FREE, THYROIDAB in the last 72 hours. Anemia Panel: No results for input(s): VITAMINB12, FOLATE, FERRITIN, TIBC, IRON, RETICCTPCT in the last 72 hours. Urine analysis:    Component Value Date/Time   COLORURINE YELLOW 04/29/2014 [place] 04/29/2014 1244   LABSPEC 1.010 04/29/2014 1244   PHURINE 5.5 04/29/2014 [place] 04/29/2014 [place] 04/29/2014 1244   BILIRUBINUR NEGATIVE 04/29/2014 1244   KETONESUR NEGATIVE 04/29/2014 1244   PROTEINUR NEGATIVE 03/23/2010 1124   UROBILINOGEN 0.2 04/29/2014 1244   NITRITE NEGATIVE 04/29/2014 1244   LEUKOCYTESUR NEGATIVE 04/29/2014 1244   Sepsis Labs: @LABRCNTIP (procalcitonin:4,lacticidven:4)  ) Recent Results (from the past 240 hour(s))  Surgical pcr screen     Status: None   Collection Time: 07/18/17  6:18  AM  Result Value Ref Range Status   MRSA, PCR NEGATIVE NEGATIVE Final   Staphylococcus aureus NEGATIVE NEGATIVE Final    Comment: (NOTE) The Xpert SA Assay (FDA approved for NASAL specimens in patients 34 years of age and older), is one component of a comprehensive surveillance program. It is not intended to diagnose infection nor to guide or monitor treatment.       Radiology Studies: No results found.   Scheduled Meds: . cholecalciferol  5,000 Units Oral Daily  . docusate sodium  100 mg Oral BID  . gabapentin  200 mg Oral Daily  . gabapentin  300 mg Oral QHS  . heparin  5,000 Units Subcutaneous Q8H  . hydroxychloroquine  200 mg Oral Daily  . losartan  100 mg Oral Daily  . metoprolol succinate  50 mg Oral BID  . sulfaSALAzine  1,000 mg Oral BID WC   Continuous Infusions: . lactated ringers 10 mL/hr at 07/18/17 1244     LOS: 5 days   Time Spent in minutes   30 minutes  Ligia Duguay D.O. on 07/21/2017 at 3:45 PM  Between 7am to 7pm - Pager - [phone]  After 7pm go to [email or web address] - password [email or web address]  And look for the  night coverage person covering for me after hours  [facility] Office  [phone]

## 2017-07-21 NOTE — Social Work (Signed)
CSW spoke with family about SNF options. Spouse at bedside and the family has accepted bed offer from [facility].  CSW contacted Suanne Marker in admissions to initiate insurance auth.  CSW will advise clinical staff as well.  Elissa Hefty, LCSW Clinical Social Worker [phone]

## 2017-07-21 NOTE — Social Work (Signed)
CSW provided pt with SNF list. Pt has questions about home health as she is unsure if she desires to go to SNF. CSW will have RNCM answer questions and then f/u . Pt has [employer] and will need SNF to obtain [employer] for placement.  CSW will f/u.  Elissa Hefty, LCSW Clinical Social Worker [phone]

## 2017-07-21 NOTE — Care Management Note (Signed)
Case Management Note  Patient Details  Name: [name] MRN: [id number] Date of Birth: 1951/06/22  Subjective/Objective:   Fall, right tibial plateau fracture, radial fracture               Action/Plan: Discharge Planning: NCM spoke to pt and offered choice for HH/list provided. Pt states she had [facility] in the past. States she will need wheelchair, right platform and 3n1 bedside commode for home. She is leaning more toward home but wanted to discuss with husband. States she will have support but had concerns about being non weight bearing and injuring the area even more. Will be SNF vs HH. After careful consideration, pt thinks SNF will be more beneficial. She has a reclining lift chair that turns into a bed to sleep. Also her husband will have stair lift installed. CSW following for SNF placement.   PCP Lucretia Kern MD  Expected Discharge Date:   07/22/2017              Expected Discharge Plan:  [place]  In-House Referral:  Clinical Social Work  Discharge planning Services  CM Consult  Post Acute Care Choice:  Home Health Choice offered to:  Patient   HH Arranged:  PT, OT, Nurse's Aide, Social Work [employer] Agency:  [place]  Status of Service:  Completed, signed off  If discussed at [employer], dates discussed:    Additional Comments:  Erenest Rasher, RN 07/21/2017, 4:38 PM

## 2017-07-21 NOTE — Clinical Social Work Note (Signed)
Clinical Social Work Assessment  Patient Details  Name: [name] MRN: [id number] Date of Birth: 1951-02-14  Date of referral:  07/21/17               Reason for consult:  Facility Placement                Permission sought to share information with:  Chartered certified accountant granted to share information::  Yes, Verbal Permission Granted  Name::     Careers information officer::  SNF  Relationship::  spouse  Contact Information:     Housing/Transportation Living arrangements for the past 2 months:  [place] of Information:  Patient Patient Interpreter Needed:  None Criminal Activity/Legal Involvement Pertinent to Current Situation/Hospitalization:    Significant Relationships:  Adult Children, Other Family Members, Spouse Lives with:  Spouse, Adult Children Do you feel safe going back to the place where you live?  Yes Need for family participation in patient care:  No (Coment)  Care giving concerns:  Pt resides at home with spouse and good family support of adult children. Pt reports that she was independent with all ADL's prior to hospitalization. Pt has never experienced SNF and desires to fully weigh her options of SNF vs home. Pt not sure if she will be able to go home, but would like to speak with RNCM about equipment as a back up.  Social Worker assessment / plan:  CSW explained her role and SNF placement/options. CSW answered all questions. CSW obtained permission to send to local SNF's and CSW provided SNF list of bed offers. CSW will assist with disposition to SNF.  Employment status:  Retired Forensic scientist:  Other (Comment Required)([employer]) PT Recommendations:  [place] / Referral to community resources:  [place]  Patient/Family's Response to care:  Patient appreciative of CSW providing information and SNF list for placement. No issues or concerns identified.  Patient/Family's  Understanding of and Emotional Response to Diagnosis, Current Treatment, and Prognosis:  Patient has good understanding of diagnosis, current treatment and prognosis as patient knws that she needs more assistance however is unsure if she wants to go to SNF or home. CSW will have RNCM speak with patient as she has home health questions before she makes her decision. CSW will continue to follow.  Emotional Assessment Appearance:  Appears stated age Attitude/Demeanor/Rapport:  (Cooperative and Friendly) Affect (typically observed):  Accepting, Appropriate Orientation:  Oriented to Situation, Oriented to  Time, Oriented to Self, Oriented to Place Alcohol / Substance use:  Not Applicable Psych involvement (Current and /or in the community):  No (Comment)  Discharge Needs  Concerns to be addressed:  Discharge Planning Concerns Readmission within the last 30 days:  No Current discharge risk:  Dependent with Mobility, Physical Impairment Barriers to Discharge:  No Barriers Identified   Christine Baxter, LCSW 07/21/2017, 1:01 PM

## 2017-07-21 NOTE — Evaluation (Signed)
Physical Therapy Re-Evaluation Patient Details Name: [name] MRN: [id number] DOB: 10/26/1950 Today's Date: 07/21/2017   History of Present Illness  Pt is a 66 y/o female who presents s/p fall down the stairs at home. Pt sustained a R tibial plateau fracture and a R distal radius fracture. She is NWB through the R wrist but is Ok to bear weight through the elbow, and NWB on the RLE. PMH significant for L1-S1 PLIF 09/2016. Patient is now s/p R wrist ORIF 12/2.    Patient is s/p above surgery resulting in functional limitations due to the deficits listed below (see PT Problem List). Re-assessed patient after R wrist ORIF 12/2. Patient is progressing slowly towards functional goals at this time with improvments in bed mobility. Still unable to ambulate in room and is limited to transfers to bed side commode with min guard. Session focused on therex and improving quality of transfers. Pt and husband agree that SNF would be best d/c dispo for her before safe return to home.  Patient will benefit from skilled PT to increase their independence and safety with mobility to allow discharge to the venue listed below.     Clinical Impression      Follow Up Recommendations SNF;Supervision/Assistance - 24 hour    Equipment Recommendations  Wheelchair (measurements PT);Wheelchair cushion (measurements PT)    Recommendations for Other Services       Precautions / Restrictions Precautions Precautions: Fall Precaution Comments: NWB status RUE and RLE Required Braces or Orthoses: Knee Immobilizer - Right Knee Immobilizer - Right: On when out of bed or walking Restrictions Weight Bearing Restrictions: Yes RUE Weight Bearing: Weight bear through elbow only RLE Weight Bearing: Non weight bearing      Mobility  Bed Mobility Overal bed mobility: Needs Assistance Bed Mobility: Supine to Sit;Sit to Supine       Sit to supine: Min assist(min A for RLE lift into bed. )   General bed mobility  comments: improved from previous sessions. Able to supine to sit without physical assistance. Requires min A for sit to supine.  Transfers Overall transfer level: Needs assistance Equipment used: Rolling walker (2 wheeled) Transfers: Sit to/from [facility] Sit to Stand: Min guard Stand pivot transfers: Min guard       General transfer comment: Min Guard to power up into walker. patient adhereing to WB status using platform on RW. patient scooting LLE with stand pivots, min guard. cues for safety.  Ambulation/Gait                Stairs            Wheelchair Mobility    Modified Rankin (Stroke Patients Only)       Balance Overall balance assessment: Needs assistance Sitting-balance support: Single extremity supported;Feet supported Sitting balance-Leahy Scale: Fair     Standing balance support: During functional activity;Bilateral upper extremity supported Standing balance-Leahy Scale: Poor Standing balance comment: reliant on UE support due to SLS from NWB RLE.                              Pertinent Vitals/Pain Pain Assessment: Faces Faces Pain Scale: Hurts little more Pain Location: knee, wrist Pain Descriptors / Indicators: Discomfort;Aching    Home Living Family/patient expects to be discharged to:: Skilled nursing facility Living Arrangements: Spouse/significant other Available Help at Discharge: Family;Available 24 hours/day Type of Home: House Home Access: Level entry     Home  Layout: Two level;1/2 bath on main level Home Equipment: Walker - 2 wheels Additional Comments: Pt reports that one of her sons, or spouse will be with her 24/7    Prior Function Level of Independence: Independent               Hand Dominance        Extremity/Trunk Assessment   Upper Extremity Assessment Upper Extremity Assessment: Defer to OT evaluation RUE Deficits / Details: NWB through wrist in cast    Lower Extremity  Assessment Lower Extremity Assessment: (decrased light touch BLE feet only. Strength LLE 4-/5 ) RLE Deficits / Details: Decreased strength and AROM consistent with above mentioned injury.  RLE: Unable to fully assess due to pain;Unable to fully assess due to immobilization RLE Sensation: history of peripheral neuropathy       Communication   Communication: No difficulties  Cognition Arousal/Alertness: Awake/alert Behavior During Therapy: WFL for tasks assessed/performed Overall Cognitive Status: Within Functional Limits for tasks assessed                                        General Comments General comments (skin integrity, edema, etc.): Patient reports some dizziness and nausea with transfers. SpO2=88% on RA, cues for breathing techinques, quickly rose back into mid 90's.     Exercises General Exercises - Lower Extremity Long Arc Quad: AAROM;Left;10 reps Hip ABduction/ADduction: AAROM;Both;10 reps Straight Leg Raises: AROM;Left;10 reps   Assessment/Plan    PT Assessment Patient needs continued PT services  PT Problem List Decreased strength;Decreased range of motion;Decreased activity tolerance;Decreased balance;Decreased mobility;Decreased knowledge of use of DME;Decreased safety awareness;Decreased knowledge of precautions;Pain       PT Treatment Interventions DME instruction;Gait training;Stair training;Functional mobility training;Therapeutic activities;Therapeutic exercise;Neuromuscular re-education;Patient/family education;Wheelchair mobility training    PT Goals (Current goals can be found in the Care Plan section)  Acute Rehab PT Goals Patient Stated Goal: get stronger, go to SNF before returning home PT Goal Formulation: With patient/family Time For Goal Achievement: 07/24/17 Potential to Achieve Goals: Good    Frequency Min 3X/week   Barriers to discharge        Co-evaluation               AM-PAC PT "6 Clicks" Daily Activity   Outcome Measure Difficulty turning over in bed (including adjusting bedclothes, sheets and blankets)?: Unable Difficulty moving from lying on back to sitting on the side of the bed? : Unable Difficulty sitting down on and standing up from a chair with arms (e.g., wheelchair, bedside commode, etc,.)?: Unable Help needed moving to and from a bed to chair (including a wheelchair)?: A Lot Help needed walking in hospital room?: Total Help needed climbing 3-5 steps with a railing? : Total 6 Click Score: 7    End of Session Equipment Utilized During Treatment: Gait belt Activity Tolerance: Patient limited by fatigue Patient left: in bed;with call bell/phone within reach;with family/visitor present Nurse Communication: Mobility status PT Visit Diagnosis: Unsteadiness on feet (R26.81);History of falling (Z91.81);Difficulty in walking, not elsewhere classified (R26.2);Pain Pain - Right/Left: Right Pain - part of body: Arm;Leg    Time: 0915-0950 PT Time Calculation (min) (ACUTE ONLY): 35 min   Charges:   PT Evaluation $PT Re-evaluation: 1 Re-eval PT Treatments $Therapeutic Exercise: 8-22 mins $Therapeutic Activity: 8-22 mins   PT G Codes:        Reinaldo Berber, PT, DPT Acute Rehab  Services Pager: [phone]    Reinaldo Berber 07/21/2017, 10:09 AM

## 2017-07-22 LAB — HEMOGLOBIN AND HEMATOCRIT, BLOOD
HCT: 32.3 % — ABNORMAL LOW (ref 36.0–46.0)
HEMOGLOBIN: 9.9 g/dL — AB (ref 12.0–15.0)

## 2017-07-22 MED ORDER — HYDROCODONE-ACETAMINOPHEN 5-325 MG PO TABS: 1.0000 | ORAL_TABLET | Freq: Four times a day (QID) | ORAL | 0 refills | Status: DC | PRN

## 2017-07-22 NOTE — Social Work (Signed)
CSW discussed with SNF the authorization. SNF indicated that they are following up with Insurance on auth. SNF is following up on SNF benefits and out of pocket co-pay. There is some concerns with cost and SNF coverage as SNF is following up on same.  CSW will continue to follow.  Elissa Hefty, LCSW Clinical Social Worker [phone]

## 2017-07-22 NOTE — Social Work (Signed)
CSW provided SNF copy of insurance card.  SNf indicated that they were unable reach [facility] authorization department for placement. CSW contacted [facility] and they provided another number for SNF to call if they are unsuccessful at getting through [phone], they can call [phone].  CSW provided contact information to SNF admissions staff to continue with obtaining authorization and clarification on benefits/out of pocket expenses. SNF aware that patient has medicare A in addition to [employer].  CSW will continue to follow for disposition.  Elissa Hefty, LCSW Clinical Social Worker [phone]

## 2017-07-22 NOTE — Progress Notes (Signed)
PROGRESS NOTE    [name]  EXH:[id number] DOB: 07-28-1951 DOA: 07/16/2017 PCP: Lucretia Kern, DO   Chief Complaint  Patient presents with  . Fall  . Facial Injury  . Epistaxis  . Wrist Pain    right  . Knee Pain    right    Brief Narrative:  HPI On 07/16/2017 by Dr. Karmen Bongo [name] is a 66 y.o. female with medical history significant of RA; pseudogout; HTN; and GERD presenting following a fall.    Patient was carrying laundry down the stairs.  She was about 2-3 steps down and she slipped.  She lost her footing and went headfirst down the stairs.  She slid on her chest and rammed her face into the floor. She injured her left face and right wrist and knee.  Her hip was hurting a bit but that seems to be okay.  She has a h/o swollen LM in her left submandibular neck years ago and it recurred recently.  She also had hypercalcemia; she was placed on vitamin D for vitamin D deficiency.  She saw ENT twice including yesterday and he has placed her on 2 consecutive antibiotics.  Assessment & Plan   Fall with multiple fractures -Patient noted to have maxillary fracture, likely to require surgical intervention -Right Radial fracture noted, currently splinted- plan for surgery today -Right Tibial plateau fracture, currently an immobilizer -Orthopedics consulted and appreciated -On 07/17/2017, Discussed with Dr. Fredonia Highland, who stated that she possibly have surgical intervention on her wrist on 07/18/2017. May not need surgical intervention on tibial plateau fracture. -S/p ORIF right wrist  -PT/OT recommended SNF -had a long discussion with patient and husband, they have now opted to look into rehab -Continue pain control and antiemetics as needed -continue pain control with IV toradol, morphine -social work consulted for SNF placement  -patient agrees to be discharged to SNF  Neck mass -Patient follows with Dr. Claria Dice. Possibly reactive lymph node -Completed  Ceftin course  Hyperparathyroidism -Followed by PCP and endocrinology -Continue vitamin D supplementation -TSH recently checked 1 week ago and was normal  Essential hypertension -Continue Toprol, Cozaar -Lasix held  Microcytic anemia -hemoglobin currently 9.9 -will continue to monitor CBC  RA -Continue Plaquenil, sulfasalazine  DVT Prophylaxis  Heparin  Code Status: Full  Family Communication: Husband at bedside  Disposition Plan: Admitted. Continue pain control, needs SNF- pending authorization  Consultants Orthopedics  Procedures  ORIF right wrist   Antibiotics   Anti-infectives (From admission, onward)   Start     Dose/Rate Route Frequency Ordered Stop   07/19/17 0600  ceFAZolin (ANCEF) IVPB 2g/100 mL premix     2 g 200 mL/hr over 30 Minutes Intravenous On call to O.R. 07/18/17 1224 07/18/17 1347   07/17/17 1000  hydroxychloroquine (PLAQUENIL) tablet 200 mg    Comments:  Patient is currently taking     200 mg Oral Daily 07/17/17 0628     07/17/17 0800  cefUROXime (CEFTIN) tablet 250 mg     250 mg Oral 2 times daily with meals 07/17/17 0628 07/20/17 0759      Subjective:   [name] seen and examined today.  Continues to have soreness. Denies chest pain, shortness of breath, abdominal pain, N/V/D/C.   Objective:   Vitals:   07/21/17 1702 07/21/17 2100 07/22/17 0450 07/22/17 1300  BP: (!) 180/78 (!) 146/58 (!) 171/74 (!) 162/70  Pulse: 67 68 67 69  Resp:  16 16 16   Temp: 99.3  F (37.4 C) 97.7 F (36.5 C) 98.3 F (36.8 C) 99.3 F (37.4 C)  TempSrc: Oral Oral Oral Oral  SpO2: 93% 100% 98% 94%  Weight:      Height:        Intake/Output Summary (Last 24 hours) at 07/22/2017 1529 Last data filed at 07/22/2017 1300 Gross per 24 hour  Intake 360 ml  Output 1100 ml  Net -740 ml   Filed Weights   07/18/17 1100  Weight: 107 kg (235 lb 14.3 oz)   Exam (no change from PE on 07/21/2017)  General: Well developed, well nourished, NAD, appears  stated age  HEENT: NCAT, mucous membranes moist. Facial bruising  Cardiovascular: S1 S2 auscultated, RRR, no murmurs  Respiratory: Clear to auscultation bilaterally with equal chest rise  Abdomen: Soft, nontender, nondistended, + bowel sounds  Extremities: warm dry without cyanosis clubbing or edema. RLE in [place], RUE wrapped  Neuro: AAOx3, nonfocal  Psych: Normal affect and demeanor, pleasant   Data Reviewed: I have personally reviewed following labs and imaging studies  CBC: Recent Labs  Lab 07/18/17 0359 07/18/17 1245 07/19/17 0540 07/20/17 0529 07/21/17 0510 07/22/17 0602  WBC 6.4 7.1 6.8 6.2 4.8  --   NEUTROABS  --  5.3  --   --   --   --   HGB 10.3* 10.6* 10.1* 10.0* 9.7* 9.9*  HCT 33.0* 34.1* 32.7* 32.4* 31.4* 32.3*  MCV 75.5* 76.8* 76.2* 76.4* 76.6*  --   PLT 192 187 178 179 185  --    Basic Metabolic Panel: Recent Labs  Lab 07/17/17 1202 07/18/17 0359 07/18/17 1245 07/19/17 0540 07/20/17 0529  NA 135 133* 134* 136 135  K 3.5 3.7 3.9 3.9 3.9  CL 102 101 101 101 101  CO2 27 26 27 29 28   GLUCOSE 118* 109* 101* 109* 104*  BUN 12 15 16 15  21*  CREATININE 0.86 0.93 0.96 0.92 0.92  CALCIUM 10.0 9.5 9.5 9.6 9.2   GFR: Estimated Creatinine Clearance: 72.4 mL/min (by C-G formula based on SCr of 0.92 mg/dL). Liver Function Tests: Recent Labs  Lab 07/18/17 1245  AST 17  ALT 20  ALKPHOS 88  BILITOT 0.6  PROT 6.0*  ALBUMIN 3.2*   No results for input(s): LIPASE, AMYLASE in the last 168 hours. No results for input(s): AMMONIA in the last 168 hours. Coagulation Profile: Recent Labs  Lab 07/18/17 1245  INR 1.05   Cardiac Enzymes: No results for input(s): CKTOTAL, CKMB, CKMBINDEX, TROPONINI in the last 168 hours. BNP (last 3 results) No results for input(s): PROBNP in the last 8760 hours. HbA1C: No results for input(s): HGBA1C in the last 72 hours. CBG: No results for input(s): GLUCAP in the last 168 hours. Lipid Profile: No results for  input(s): CHOL, HDL, LDLCALC, TRIG, CHOLHDL, LDLDIRECT in the last 72 hours. Thyroid Function Tests: No results for input(s): TSH, T4TOTAL, FREET4, T3FREE, THYROIDAB in the last 72 hours. Anemia Panel: No results for input(s): VITAMINB12, FOLATE, FERRITIN, TIBC, IRON, RETICCTPCT in the last 72 hours. Urine analysis:    Component Value Date/Time   COLORURINE YELLOW 04/29/2014 [place] 04/29/2014 1244   LABSPEC 1.010 04/29/2014 1244   PHURINE 5.5 04/29/2014 [place] 04/29/2014 [place] 04/29/2014 [place] 04/29/2014 [place] 04/29/2014 1244   PROTEINUR NEGATIVE 03/23/2010 1124   UROBILINOGEN 0.2 04/29/2014 1244   NITRITE NEGATIVE 04/29/2014 [place] 04/29/2014 1244  Sepsis Labs: @LABRCNTIP (procalcitonin:4,lacticidven:4)  ) Recent Results (from the past 240 hour(s))  Surgical pcr screen     Status: None   Collection Time: 07/18/17  6:18 AM  Result Value Ref Range Status   MRSA, PCR NEGATIVE NEGATIVE Final   Staphylococcus aureus NEGATIVE NEGATIVE Final    Comment: (NOTE) The Xpert SA Assay (FDA approved for NASAL specimens in patients 64 years of age and older), is one component of a comprehensive surveillance program. It is not intended to diagnose infection nor to guide or monitor treatment.       Radiology Studies: No results found.   Scheduled Meds: . cholecalciferol  5,000 Units Oral Daily  . docusate sodium  100 mg Oral BID  . gabapentin  200 mg Oral Daily  . gabapentin  300 mg Oral QHS  . heparin  5,000 Units Subcutaneous Q8H  . hydroxychloroquine  200 mg Oral Daily  . losartan  100 mg Oral Daily  . metoprolol succinate  50 mg Oral BID  . sulfaSALAzine  1,000 mg Oral BID WC   Continuous Infusions: . lactated ringers 10 mL/hr at 07/18/17 1244     LOS: 6 days   Time Spent in minutes   30 minutes  Delano Frate D.O. on 07/22/2017 at 3:29 PM  Between  7am to 7pm - Pager - [phone]  After 7pm go to [email or web address] - password [email or web address]  And look for the night coverage person covering for me after hours  [facility] Office  [phone]

## 2017-07-23 DIAGNOSIS — S0240CA Maxillary fracture, right side, initial encounter for closed fracture: Secondary | ICD-10-CM

## 2017-07-23 DIAGNOSIS — W19XXXA Unspecified fall, initial encounter: Secondary | ICD-10-CM

## 2017-07-23 DIAGNOSIS — Y92009 Unspecified place in unspecified non-institutional (private) residence as the place of occurrence of the external cause: Secondary | ICD-10-CM

## 2017-07-23 DIAGNOSIS — S62101A Fracture of unspecified carpal bone, right wrist, initial encounter for closed fracture: Secondary | ICD-10-CM

## 2017-07-23 DIAGNOSIS — S82141A Displaced bicondylar fracture of right tibia, initial encounter for closed fracture: Secondary | ICD-10-CM

## 2017-07-23 MED ORDER — POLYETHYLENE GLYCOL 3350 17 G PO PACK
17.0000 g | PACK | Freq: Once | ORAL | Status: AC
  Administered 2017-07-23: 17 g via ORAL

## 2017-07-23 MED ORDER — POLYETHYLENE GLYCOL 3350 17 G PO PACK: 17.0000 g | PACK | Freq: Every day | ORAL | 0 refills | Status: DC | PRN

## 2017-07-23 MED ORDER — SENNOSIDES-DOCUSATE SODIUM 8.6-50 MG PO TABS
1.0000 | ORAL_TABLET | Freq: Two times a day (BID) | ORAL | Status: DC
  Administered 2017-07-23: 1 via ORAL
  Filled 2017-07-23: qty 1

## 2017-07-23 MED ORDER — SENNOSIDES-DOCUSATE SODIUM 8.6-50 MG PO TABS: 1.0000 | ORAL_TABLET | Freq: Two times a day (BID) | ORAL | Status: DC

## 2017-07-23 MED ORDER — GABAPENTIN 100 MG PO CAPS: 200.0000 mg | ORAL_CAPSULE | Freq: Every day | ORAL | Status: DC

## 2017-07-23 NOTE — Clinical Social Work Placement (Signed)
   CLINICAL SOCIAL WORK PLACEMENT  NOTE  Date:  07/23/2017  Patient Details  Name: [name] MRN: [id number] Date of Birth: 21-Jul-1951  Clinical Social Work is seeking post-discharge placement for this patient at the [place] level of care (*CSW will initial, date and re-position this form in  chart as items are completed):  Yes   Patient/family provided with [facility] Work Department's list of facilities offering this level of care within the geographic area requested by the patient (or if unable, by the patient's family).  Yes   Patient/family informed of their freedom to choose among providers that offer the needed level of care, that participate in Medicare, Medicaid or managed care program needed by the patient, have an available bed and are willing to accept the patient.  Yes   Patient/family informed of [place]'s ownership interest in [facility], as well as of the fact that they are under no obligation to receive care at these facilities.  PASRR submitted to EDS on 07/18/17     PASRR number received on 07/18/17     Existing PASRR number confirmed on       FL2 transmitted to all facilities in geographic area requested by pt/family on 07/18/17     FL2 transmitted to all facilities within larger geographic area on       Patient informed that his/her managed care company has contracts with or will negotiate with certain facilities, including the following:        Yes   Patient/family informed of bed offers received.  Patient chooses bed at [facility] recommends and patient chooses bed at      Patient to be transferred to [facility] on 07/22/17.  Patient to be transferred to facility by [facility]     Patient family notified on 07/23/17 of transfer.  Name of family member notified:  spouse advised at bedside     PHYSICIAN Please prepare prescriptions, Please  prepare priority discharge summary, including medications     Additional Comment:    _______________________________________________ Normajean Baxter, LCSW 07/23/2017, 9:51 AM

## 2017-07-23 NOTE — Progress Notes (Signed)
Report called to [place] at [facility]. [facility] at bedside to transport patient to rehab. VSS. No s/s of distress noted.

## 2017-07-23 NOTE — Discharge Summary (Signed)
Physician Discharge Summary   Patient ID: [name] MRN: [id number] DOB/AGE: [birth year] 66 y.o.  Admit date: 07/16/2017 Discharge date: 07/23/2017  Primary Care Physician:  Lucretia Kern, DO  Discharge Diagnoses:    . Tibial plateau fracture, right, closed, initial encounter . Left maxillary fracture ([place]) . Closed right radial fracture . Hyperparathyroidism ([place]) . Hypertension . Neck mass . Rheumatoid arthritis ([place])   Consults: Orthopedics  Recommendations for Outpatient Follow-up:  1. Per orthopedics DVT prophylaxis aspirin 2. Please repeat CBC/BMET at next visit   DIET: Heart healthy diet    Allergies:   Allergies  Allergen Reactions  . Amlodipine Besylate Other (See Comments)    Tremors  . Nortriptyline Other (See Comments)    tremors  . Dilaudid [Hydromorphone Hcl]     Headache, muscle tightness     DISCHARGE MEDICATIONS: Allergies as of 07/23/2017      Reactions   Amlodipine Besylate Other (See Comments)   Tremors   Nortriptyline Other (See Comments)   tremors   Dilaudid [hydromorphone Hcl]    Headache, muscle tightness      Medication List    STOP taking these medications   cefUROXime 250 MG tablet Commonly known as:  CEFTIN     TAKE these medications   aspirin 81 MG tablet Take 81 mg by mouth at bedtime.   furosemide 20 MG tablet Commonly known as:  LASIX TAKE 1 TABLET BY MOUTH ONCE DAILY   gabapentin 300 MG capsule Commonly known as:  NEURONTIN Take 1 capsule (300 mg total) by mouth at bedtime. What changed:  Another medication with the same name was changed. Make sure you understand how and when to take each.   gabapentin 100 MG capsule Commonly known as:  NEURONTIN Take 2 capsules (200 mg total) by mouth daily. AM What changed:    when to take this  additional instructions   guaiFENesin 600 MG 12 hr tablet Commonly known as:  MUCINEX Take 600 mg by mouth 2 (two) times daily as needed for cough or to loosen  phlegm.   HYDROcodone-acetaminophen 5-325 MG tablet Commonly known as:  NORCO/VICODIN Take 1-2 tablets by mouth every 6 (six) hours as needed for moderate pain.   hydroxychloroquine 200 MG tablet Commonly known as:  PLAQUENIL Take 200 mg by mouth daily. Patient is currently taking   losartan 100 MG tablet Commonly known as:  COZAAR Take 1 tablet (100 mg total) by mouth daily.   metoprolol succinate 50 MG 24 hr tablet Commonly known as:  TOPROL-XL Take 2 tablets (100 mg total) by mouth daily. Take with or immediately following a meal. What changed:    how much to take  when to take this  additional instructions   polyethylene glycol packet Commonly known as:  MIRALAX / GLYCOLAX Take 17 g by mouth daily as needed.   ranitidine 150 MG tablet Commonly known as:  ZANTAC Take 150 mg by mouth daily as needed for heartburn.   senna-docusate 8.6-50 MG tablet Commonly known as:  Senokot-S Take 1 tablet by mouth 2 (two) times daily.   sulfaSALAzine 500 MG EC tablet Commonly known as:  AZULFIDINE TAKE 2 TABLETS BY MOUTH TWICE DAILY   tiZANidine 4 MG tablet Commonly known as:  ZANAFLEX Take 0.5-1 tablets (2-4 mg total) by mouth every 6 (six) hours as needed for muscle spasms.   VITAMIN B PLUS+ PO Take 1 tablet by mouth every evening.   Vitamin D-3 5000 units Tabs Take 1 tablet by  mouth daily.            Durable Medical Equipment  (From admission, onward)        Start     Ordered   07/21/17 1523  For home use only DME Walker platform  Once    Comments:  Pt has Rolling walker at home  Question:  Patient needs a walker to treat with the following condition  Answer:  Right wrist fracture   07/21/17 1523   07/21/17 1522  For home use only DME 3 n 1  Once    Comments:  Drop arm wide, pt is right leg non weight bearing   07/21/17 1522   07/21/17 1520  For home use only DME lightweight manual wheelchair with seat cushion  Once    Comments:  Patient suffers from right  tibial fracture which impairs their ability to perform daily activities like walking, stand and bathe in the home.  A rolling walker will not resolve issue with performing activities of daily living. A wheelchair will allow patient to safely perform daily activities. Patient is not able to propel themselves in the home using a standard weight wheelchair due to right leg non weight bearing, pain, weakness. Patient can self propel in the lightweight wheelchair.  Accessories: elevating leg rests (ELRs), wheel locks, extensions and anti-tippers,cushion   07/21/17 1521       Brief H and P: For complete details please refer to admission H and P, but in brief HPI On 07/16/2017 by Dr. [place] Sink Bunnis a 66 y.o.femalewith medical history significant ofRA; pseudogout; HTN; and GERD presenting following a fall.Patient was carrying laundry down the stairs. She was about 2-3 steps down and she slipped. She lost her footing and went headfirst down the stairs. She slid on her chest and rammed her face into the floor. She injured her left face and right wrist and knee. Her hip was hurting a bit but that seems to be okay. She has a h/o swollen LM in herleft submandibularneck years ago and it recurred recently. She also had hypercalcemia; she was placed on vitamin D for vitamin D deficiency. She saw ENT twice including yesterday and he has placed her on 2 consecutiveantibiotics.   Hospital Course:   Fall with multiple fractures -Right radial fracture, right tibial plateau fracture in immobilizer -Orthopedics was consulted. -Per Dr. Percell Miller, may not need surgical intervention for tibial plateau fracture -Status post ORIF right wrist -Per orthopedics, aspirin for DVT prophylaxis -PT recommended skilled nursing facility, continue pain control, bowel regimen  Neck mass -Follows ENT Dr. Claria Dice outpatient, possibly reactive lymph node, now completed [place]  course   Hyperparathyroidism -Followed by PCP and endocrinology, continue vitamin D supplementation -TSH normal  Essential hypertension Currently stable, continue Toprol, Cozaar, restart Lasix  Microcytic anemia H&H stable, 9.9 at the time of discharge  RA -Continue Plaquenil, sulfasalazine     Day of Discharge S: Doing well, pain controlled, feels slightly constipated today  BP (!) 153/79 (BP Location: Right Arm)   Pulse 71   Temp 98.3 F (36.8 C) (Oral)   Resp 16   Ht 5\' 6"  (1.676 m)   Wt 107 kg (235 lb 14.3 oz)   SpO2 98%   BMI 38.07 kg/m   Physical Exam: General: Alert and awake oriented x3 not in any acute distress. HEENT: anicteric sclera, pupils reactive to light and accommodation CVS: S1-S2 clear no murmur rubs or gallops Chest: clear to auscultation bilaterally, no wheezing rales or  rhonchi Abdomen: soft nontender, nondistended, normal bowel sounds Extremities: no cyanosis, clubbing or edema noted bilaterally, RLE in the immobilizer, RUE dressing intact     The results of significant diagnostics from this hospitalization (including imaging, microbiology, ancillary and laboratory) are listed below for reference.    LAB RESULTS: Basic Metabolic Panel: Recent Labs  Lab 07/19/17 0540 07/20/17 0529  NA 136 135  K 3.9 3.9  CL 101 101  CO2 29 28  GLUCOSE 109* 104*  BUN 15 21*  CREATININE 0.92 0.92  CALCIUM 9.6 9.2   Liver Function Tests: Recent Labs  Lab 07/18/17 1245  AST 17  ALT 20  ALKPHOS 88  BILITOT 0.6  PROT 6.0*  ALBUMIN 3.2*   No results for input(s): LIPASE, AMYLASE in the last 168 hours. No results for input(s): AMMONIA in the last 168 hours. CBC: Recent Labs  Lab 07/18/17 1245  07/20/17 0529 07/21/17 0510 07/22/17 0602  WBC 7.1   < > 6.2 4.8  --   NEUTROABS 5.3  --   --   --   --   HGB 10.6*   < > 10.0* 9.7* 9.9*  HCT 34.1*   < > 32.4* 31.4* 32.3*  MCV 76.8*   < > 76.4* 76.6*  --   PLT 187   < > 179 185  --    < > =  values in this interval not displayed.   Cardiac Enzymes: No results for input(s): CKTOTAL, CKMB, CKMBINDEX, TROPONINI in the last 168 hours. BNP: Invalid input(s): POCBNP CBG: No results for input(s): GLUCAP in the last 168 hours.  Significant Diagnostic Studies:  Dg Wrist Complete Right  Result Date: 07/16/2017 CLINICAL DATA:  Right wrist pain post fall. EXAM: RIGHT WRIST - COMPLETE 3+ VIEW COMPARISON:  05/09/2004 FINDINGS: There is a mildly comminuted impacted fracture of the distal radius with no significant displacement. The main horizontal fracture line is located 18 mm proximal to the radiocarpal joint. Additional longitudinal lucency extends to the radiocarpal joint. It is difficult to determine whether this particular lucency represents an intra-articular extension of the acute fracture or incomplete healing of the previous fracture demonstrated in 2005, as it has a strikingly similar appearance. Old posttraumatic changes from avulsion fracture of the ulnar styloid process. IMPRESSION: Mildly comminuted impacted fracture of the distal radius with no significant displacement. It is uncertain whether there is an intra-articular extension to the radiocarpal joint. Electronically Signed   By: Fidela Salisbury M.D.   On: 07/16/2017 18:29   Ct Head Wo Contrast  Result Date: 07/16/2017 CLINICAL DATA:  Fall while carrying laundry downstairs. EXAM: CT HEAD WITHOUT CONTRAST CT CERVICAL SPINE WITHOUT CONTRAST TECHNIQUE: Multidetector CT imaging of the head and cervical spine was performed following the standard protocol without intravenous contrast. Multiplanar CT image reconstructions of the cervical spine were also generated. COMPARISON:  None. FINDINGS: CT HEAD FINDINGS Brain: Mild generalized atrophy. Normal for age minimal chronic small vessel ischemia. No intracranial hemorrhage, mass effect, or midline shift. No hydrocephalus. The basilar cisterns are patent. No evidence of territorial  infarct or acute ischemia. No extra-axial or intracranial fluid collection. Vascular: Atherosclerosis of skullbase vasculature without hyperdense vessel or abnormal calcification. Skull: No fracture or focal lesion. Sinuses/Orbits: Minimally displaced fractures through the anterior and lateral walls the left maxillary sinus with associated hemosinus. Mucosal thickening of left ethmoid air cells. Mastoid air cells are clear. Bilateral cataract resection. Other: Small right frontal scalp hematoma. CT CERVICAL SPINE FINDINGS Alignment: Normal. Skull base and  vertebrae: No acute fracture. Vertebral body heights are maintained. The dens and skull base are intact. Soft tissues and spinal canal: No prevertebral fluid or swelling. No visible canal hematoma. Disc levels: Disc space narrowing and endplate spurring at M0-Q6 and C6-C7. Scattered multilevel facet arthropathy. Upper chest: No acute abnormality. Enlarged right lobe with these thyroid gland containing small nodules, similar to prior neck CT 06/06/2015. Other: None. IMPRESSION: 1.  No acute intracranial abnormality. 2. Acute minimally displaced fracture anterior and lateral walls of the left maxillary sinus. 3. No fracture or subluxation of the cervical spine. Electronically Signed   By: Jeb Levering M.D.   On: 07/16/2017 21:10   Ct Cervical Spine Wo Contrast  Result Date: 07/16/2017 CLINICAL DATA:  Fall while carrying laundry downstairs. EXAM: CT HEAD WITHOUT CONTRAST CT CERVICAL SPINE WITHOUT CONTRAST TECHNIQUE: Multidetector CT imaging of the head and cervical spine was performed following the standard protocol without intravenous contrast. Multiplanar CT image reconstructions of the cervical spine were also generated. COMPARISON:  None. FINDINGS: CT HEAD FINDINGS Brain: Mild generalized atrophy. Normal for age minimal chronic small vessel ischemia. No intracranial hemorrhage, mass effect, or midline shift. No hydrocephalus. The basilar cisterns are  patent. No evidence of territorial infarct or acute ischemia. No extra-axial or intracranial fluid collection. Vascular: Atherosclerosis of skullbase vasculature without hyperdense vessel or abnormal calcification. Skull: No fracture or focal lesion. Sinuses/Orbits: Minimally displaced fractures through the anterior and lateral walls the left maxillary sinus with associated hemosinus. Mucosal thickening of left ethmoid air cells. Mastoid air cells are clear. Bilateral cataract resection. Other: Small right frontal scalp hematoma. CT CERVICAL SPINE FINDINGS Alignment: Normal. Skull base and vertebrae: No acute fracture. Vertebral body heights are maintained. The dens and skull base are intact. Soft tissues and spinal canal: No prevertebral fluid or swelling. No visible canal hematoma. Disc levels: Disc space narrowing and endplate spurring at P6-P9 and C6-C7. Scattered multilevel facet arthropathy. Upper chest: No acute abnormality. Enlarged right lobe with these thyroid gland containing small nodules, similar to prior neck CT 06/06/2015. Other: None. IMPRESSION: 1.  No acute intracranial abnormality. 2. Acute minimally displaced fracture anterior and lateral walls of the left maxillary sinus. 3. No fracture or subluxation of the cervical spine. Electronically Signed   By: Jeb Levering M.D.   On: 07/16/2017 21:10   Ct Knee Right Wo Contrast  Result Date: 07/16/2017 CLINICAL DATA:  Acute tibial plateau fracture, right knee pain. Fall carrying [employer]. EXAM: CT OF THE RIGHT KNEE WITHOUT CONTRAST TECHNIQUE: Multidetector CT imaging of the RIGHT knee was performed according to the standard protocol. Multiplanar CT image reconstructions were also generated. COMPARISON:  Radiograph earlier this day FINDINGS: Bones/Joint/Cartilage Acute comminuted lateral tibial plateau fracture. Mild displacement involving the anterior-most cortex. Only minimal articular surface depression, less than 1 mm. Fracture  extends to the tibial spines posteriorly. No vertically-oriented component. No metaphyseal component. Moderate lipohemarthrosis. No additional fracture of the patella, distal femur, or proximal fibula. Medial and lateral tibiofemoral chondrocalcinosis incidentally noted. Ligaments Suboptimally assessed by CT.  Possible ACL thickening. Muscles and Tendons No intramuscular hematoma. Quadriceps and patellar tendons are intact. Soft tissues Soft tissue edema about the knee. IMPRESSION: Schatzker type 3 minimally depressed lateral tibial plateau fracture. Electronically Signed   By: Jeb Levering M.D.   On: 07/16/2017 21:22   Chest Portable 1 View  Result Date: 07/17/2017 CLINICAL DATA:  66 year old female with intermittent cough. Preoperative study. EXAM: PORTABLE CHEST 1 VIEW COMPARISON:  06/20/2011. FINDINGS: Portable AP  semi upright view at 1339 hours. Lower lung volumes. Mediastinal contours are stable. Cardiac size at the upper limits of normal. Visualized tracheal air column is within normal limits. Allowing for portable technique the lungs are clear. No pneumothorax or pleural effusion. Partially calcified left breast implant. IMPRESSION: No acute cardiopulmonary abnormality. Electronically Signed   By: Genevie Ann M.D.   On: 07/17/2017 14:20   Dg Knee Complete 4 Views Right  Result Date: 07/16/2017 CLINICAL DATA:  Recent fall downstairs, generalized right knee pain EXAM: RIGHT KNEE - COMPLETE 4+ VIEW COMPARISON:  None available FINDINGS: Mild degenerative changes noted with chondrocalcinosis. Slight osteopenia. Joint effusion noted on the lateral view. Acute minimally depressed lateral tibial plateau fracture noted on the oblique views. IMPRESSION: Acute minimally depressed lateral tibial plateau fracture and likely an associated right knee effusion/ hemarthrosis. Right knee degenerative change with chondrocalcinosis. Electronically Signed   By: Jerilynn Mages.  Shick M.D.   On: 07/16/2017 18:17    2D  ECHO:   Disposition and Follow-up: Discharge Instructions    Diet - low sodium heart healthy   Complete by:  As directed    Increase activity slowly   Complete by:  As directed        DISPOSITION: SNF   DISCHARGE FOLLOW-UP  Contact information for follow-up providers    Renette Butters, MD. Schedule an appointment as soon as possible for a visit in 2 week(s).   Specialty:  Orthopedic Surgery Contact information: [address] [place] [address] [phone]        Lucretia Kern, DO. Schedule an appointment as soon as possible for a visit in 3 week(s).   Specialty:  Family Medicine Contact information: [place] [address] [phone]            Contact information for after-discharge care    Destination    [facility] Follow up.   Service:  Skilled Nursing Contact information: [address] [place] [phone]                   Time spent on Discharge: 68mins   Signed:   Estill Cotta M.D. [facility] 07/23/2017, 10:02 AM Pager: [phone]

## 2017-07-23 NOTE — Progress Notes (Signed)
Attempted to call report to [facility], no answer at this time. Will attempt to call again.

## 2017-07-23 NOTE — Progress Notes (Signed)
Attempted to call report again to [facility], was transferred to the nursing unit and sent to voicemail. Left voicemail with callback number. Paperwork reviewed with patient and husband. IV removed, catheter tip intact. No questions/concerns at this time. Awaiting [facility] transport to rehab.

## 2017-07-23 NOTE — Social Work (Signed)
Clinical Social Worker facilitated patient discharge including contacting patient family and facility to confirm patient discharge plans.  Clinical information faxed to facility and family agreeable with plan.    CSW arranged ambulance transport via [facility] to [facility].    RN to call [phone] to give report prior to discharge.  Clinical Social Worker will sign off for now as social work intervention is no longer needed. Please consult [place] again if new need arises.  Elissa Hefty, LCSW Clinical Social Worker [phone]

## 2017-07-23 NOTE — Progress Notes (Signed)
PT Cancellation Note  Patient Details Name: [name] MRN: [id number] DOB: 12-24-1950   Cancelled Treatment:     attempted to work with patient at 0950, patient refusing citing transport is on their way and is waiting in bed until they arrive. Patient leaving for SNF today.   Reinaldo Berber, PT, DPT Acute Rehab Services Pager: [phone]     Reinaldo Berber 07/23/2017, 10:06 AM

## 2017-07-24 ENCOUNTER — Other Ambulatory Visit: Payer: Self-pay | Admitting: *Deleted

## 2017-07-24 ENCOUNTER — Encounter: Payer: Self-pay | Admitting: *Deleted

## 2017-07-24 NOTE — Patient Outreach (Addendum)
[facility]) Care Management  07/24/2017  [name] September 07, 1950 [id number]  Subjective: Telephone call to patient's mobile number, spoke with patient times 2, and HIPAA verified.  Discussed [facility] Care Management UMR Transition of care follow up, patient voiced understanding, and is in agreement to follow up.   Patient states she remembers speaking with this RNCM in the past, doing ok, currently at home, and was in the rehab facility for less than 12 hours.   States she was discharged from [facility] on 07/23/17 to [facility] (skilled nursing facility).  States facility was not able to provide her with the care that she needed (ice therapy, platform walker), had her husband to come to facility, pick her up, and brought her home.  States her husband and son's are available to assist with activities of daily living / home management as needed.   States she has already contact surgeon's office (Dr. Percell Miller) to order home health services through [facility] and rescheduled follow up appointment for 08/01/17.   States Dr. Percell Miller is out of the office today and she will follow up with the office on 07/25/17 to verify home health orders sent to [facility],   Patient gave this [facility] verbal authorization to speak with husband [name]) regarding healthcare needs as needed.  Spoke with patient's husband [name], advised of RNCM role and discussed [facility] services.  Husband states he does not need family medical leave act (FMLA) at this time and is currently using his PAL (paid annual leave) time to be off with patient.  States he planning to go to [employer] this afternoon to get a platform for patient walker and will advise provider to contact surgeon's office if order needed.  Patient states she has a wheelchair, bedside commode, and the lift chair has been ordered.  Patient voices understanding of medical diagnosis,  surgery, and treatment plan.  States she is accessing the following [facility] benefits: outpatient pharmacy, and hospital indemnity (not chosen per last transition of care follow up).  States she is interesting in getting Medicare part B, advised patient to call customer service number on the back of Medicare card, or review medicare.gov website, patient voices understanding, and states she will follow up. Patient states she does not have any education material, transition of care, care coordination, disease management, disease monitoring, transportation, community resource, or pharmacy needs at this time.  States she is very appreciative of the follow up and is in agreement to receive [place] Management information post transition of care follow up.     Objective: Per KPN (Knowledge Performance Now, point of care tool) and chart review, patient hospitalized 07/16/17 -07/23/17 for multiple fractures (right wrist fracture, Tibial plateau fracture, right, closed, Left maxillary fracture), status post fall.  Status post OPEN REDUCTION INTERNAL FIXATION (ORIF) WRIST FRACTURE on 07/18/17.   Patient hospitalized 10/11/16 - 10/14/16.   Patient has a history of rheumatoid arthritis, hypertension, and hyperlipidemia.   [facility] Care Management Transition of care follow up completed on 10/16/16.      Assessment: Received UMR Transition of care referral on 07/18/17.    Transition of care referral completed, no care management needs, and will proceed with case closure.     Plan: RNCM will send successful outreach letter, [facility] pamphlet, and magnet. RNCM will send case closure due to follow up completed / no care management needs request to [facility].  Roxan Yamamoto H. Annia Friendly, BSN, [place] [facility] Telephonic CM Phone: [phone] Fax: [phone]

## 2017-07-26 DIAGNOSIS — S82141D Displaced bicondylar fracture of right tibia, subsequent encounter for closed fracture with routine healing: Secondary | ICD-10-CM | POA: Diagnosis not present

## 2017-07-26 DIAGNOSIS — M8580 Other specified disorders of bone density and structure, unspecified site: Secondary | ICD-10-CM | POA: Diagnosis not present

## 2017-07-26 DIAGNOSIS — M19049 Primary osteoarthritis, unspecified hand: Secondary | ICD-10-CM | POA: Diagnosis not present

## 2017-07-26 DIAGNOSIS — K219 Gastro-esophageal reflux disease without esophagitis: Secondary | ICD-10-CM | POA: Diagnosis not present

## 2017-07-26 DIAGNOSIS — D509 Iron deficiency anemia, unspecified: Secondary | ICD-10-CM | POA: Diagnosis not present

## 2017-07-26 DIAGNOSIS — S0240DD Maxillary fracture, left side, subsequent encounter for fracture with routine healing: Secondary | ICD-10-CM | POA: Diagnosis not present

## 2017-07-26 DIAGNOSIS — I10 Essential (primary) hypertension: Secondary | ICD-10-CM | POA: Diagnosis not present

## 2017-07-26 DIAGNOSIS — S52501D Unspecified fracture of the lower end of right radius, subsequent encounter for closed fracture with routine healing: Secondary | ICD-10-CM | POA: Diagnosis not present

## 2017-07-26 DIAGNOSIS — M069 Rheumatoid arthritis, unspecified: Secondary | ICD-10-CM | POA: Diagnosis not present

## 2017-07-30 ENCOUNTER — Ambulatory Visit: Payer: Medicare Other | Admitting: Internal Medicine

## 2017-07-31 DIAGNOSIS — M069 Rheumatoid arthritis, unspecified: Secondary | ICD-10-CM | POA: Diagnosis not present

## 2017-07-31 DIAGNOSIS — S52501D Unspecified fracture of the lower end of right radius, subsequent encounter for closed fracture with routine healing: Secondary | ICD-10-CM | POA: Diagnosis not present

## 2017-07-31 DIAGNOSIS — S82141D Displaced bicondylar fracture of right tibia, subsequent encounter for closed fracture with routine healing: Secondary | ICD-10-CM | POA: Diagnosis not present

## 2017-07-31 DIAGNOSIS — D509 Iron deficiency anemia, unspecified: Secondary | ICD-10-CM | POA: Diagnosis not present

## 2017-07-31 DIAGNOSIS — S0240DD Maxillary fracture, left side, subsequent encounter for fracture with routine healing: Secondary | ICD-10-CM | POA: Diagnosis not present

## 2017-07-31 DIAGNOSIS — K219 Gastro-esophageal reflux disease without esophagitis: Secondary | ICD-10-CM | POA: Diagnosis not present

## 2017-07-31 DIAGNOSIS — I10 Essential (primary) hypertension: Secondary | ICD-10-CM | POA: Diagnosis not present

## 2017-07-31 DIAGNOSIS — M19049 Primary osteoarthritis, unspecified hand: Secondary | ICD-10-CM | POA: Diagnosis not present

## 2017-07-31 DIAGNOSIS — M8580 Other specified disorders of bone density and structure, unspecified site: Secondary | ICD-10-CM | POA: Diagnosis not present

## 2017-07-31 MED FILL — sulfaSALAzine 500 MG TABS: 500 | 30 days supply | Qty: 120 | Fill #0

## 2017-08-01 DIAGNOSIS — S52501D Unspecified fracture of the lower end of right radius, subsequent encounter for closed fracture with routine healing: Secondary | ICD-10-CM | POA: Diagnosis not present

## 2017-08-01 DIAGNOSIS — S82144D Nondisplaced bicondylar fracture of right tibia, subsequent encounter for closed fracture with routine healing: Secondary | ICD-10-CM | POA: Diagnosis not present

## 2017-08-18 ENCOUNTER — Ambulatory Visit: Payer: Medicare Other | Admitting: Neurology

## 2017-08-21 ENCOUNTER — Other Ambulatory Visit: Payer: Self-pay | Admitting: Family Medicine

## 2017-08-21 MED FILL — METOPROLOL SUCC ER 50 MG TA: 50 | 90 days supply | Qty: 180 | Fill #0

## 2017-08-25 ENCOUNTER — Ambulatory Visit: Payer: Medicare Other | Admitting: Neurology

## 2017-08-28 ENCOUNTER — Encounter: Payer: Self-pay | Admitting: Family Medicine

## 2017-08-29 DIAGNOSIS — S82144D Nondisplaced bicondylar fracture of right tibia, subsequent encounter for closed fracture with routine healing: Secondary | ICD-10-CM | POA: Diagnosis not present

## 2017-08-29 DIAGNOSIS — S52501D Unspecified fracture of the lower end of right radius, subsequent encounter for closed fracture with routine healing: Secondary | ICD-10-CM | POA: Diagnosis not present

## 2017-09-08 DIAGNOSIS — M5441 Lumbago with sciatica, right side: Secondary | ICD-10-CM | POA: Diagnosis not present

## 2017-09-08 DIAGNOSIS — E669 Obesity, unspecified: Secondary | ICD-10-CM | POA: Diagnosis not present

## 2017-09-08 DIAGNOSIS — Z6838 Body mass index (BMI) 38.0-38.9, adult: Secondary | ICD-10-CM | POA: Diagnosis not present

## 2017-09-08 DIAGNOSIS — M2550 Pain in unspecified joint: Secondary | ICD-10-CM | POA: Diagnosis not present

## 2017-09-08 DIAGNOSIS — I7381 Erythromelalgia: Secondary | ICD-10-CM | POA: Diagnosis not present

## 2017-09-08 DIAGNOSIS — R5382 Chronic fatigue, unspecified: Secondary | ICD-10-CM | POA: Diagnosis not present

## 2017-09-08 DIAGNOSIS — M7989 Other specified soft tissue disorders: Secondary | ICD-10-CM | POA: Diagnosis not present

## 2017-09-08 DIAGNOSIS — M5136 Other intervertebral disc degeneration, lumbar region: Secondary | ICD-10-CM | POA: Diagnosis not present

## 2017-09-08 DIAGNOSIS — M0609 Rheumatoid arthritis without rheumatoid factor, multiple sites: Secondary | ICD-10-CM | POA: Diagnosis not present

## 2017-09-08 MED FILL — sulfaSALAzine 500 MG TBEC: 500 | 30 days supply | Qty: 120 | Fill #0

## 2017-09-08 MED FILL — HYDROXYCHLOROQUINE 200 MG T: 200 | 30 days supply | Qty: 30 | Fill #0

## 2017-09-10 DIAGNOSIS — M25661 Stiffness of right knee, not elsewhere classified: Secondary | ICD-10-CM | POA: Diagnosis not present

## 2017-09-10 DIAGNOSIS — R531 Weakness: Secondary | ICD-10-CM | POA: Diagnosis not present

## 2017-09-10 DIAGNOSIS — S82144D Nondisplaced bicondylar fracture of right tibia, subsequent encounter for closed fracture with routine healing: Secondary | ICD-10-CM | POA: Diagnosis not present

## 2017-09-10 DIAGNOSIS — R262 Difficulty in walking, not elsewhere classified: Secondary | ICD-10-CM | POA: Diagnosis not present

## 2017-09-15 DIAGNOSIS — R262 Difficulty in walking, not elsewhere classified: Secondary | ICD-10-CM | POA: Diagnosis not present

## 2017-09-15 DIAGNOSIS — M25661 Stiffness of right knee, not elsewhere classified: Secondary | ICD-10-CM | POA: Diagnosis not present

## 2017-09-15 DIAGNOSIS — S82144D Nondisplaced bicondylar fracture of right tibia, subsequent encounter for closed fracture with routine healing: Secondary | ICD-10-CM | POA: Diagnosis not present

## 2017-09-15 DIAGNOSIS — R531 Weakness: Secondary | ICD-10-CM | POA: Diagnosis not present

## 2017-09-18 MED FILL — LOSARTAN POTASSIUM 100 MG T: 100 | 90 days supply | Qty: 90 | Fill #3

## 2017-09-18 MED FILL — GABAPENTIN 300 MG CAPSULE: 300 | 90 days supply | Qty: 90 | Fill #1

## 2017-09-22 DIAGNOSIS — S82144D Nondisplaced bicondylar fracture of right tibia, subsequent encounter for closed fracture with routine healing: Secondary | ICD-10-CM | POA: Diagnosis not present

## 2017-09-22 DIAGNOSIS — R262 Difficulty in walking, not elsewhere classified: Secondary | ICD-10-CM | POA: Diagnosis not present

## 2017-09-22 DIAGNOSIS — M25661 Stiffness of right knee, not elsewhere classified: Secondary | ICD-10-CM | POA: Diagnosis not present

## 2017-09-22 DIAGNOSIS — R531 Weakness: Secondary | ICD-10-CM | POA: Diagnosis not present

## 2017-09-24 DIAGNOSIS — R262 Difficulty in walking, not elsewhere classified: Secondary | ICD-10-CM | POA: Diagnosis not present

## 2017-09-24 DIAGNOSIS — R531 Weakness: Secondary | ICD-10-CM | POA: Diagnosis not present

## 2017-09-24 DIAGNOSIS — S82144D Nondisplaced bicondylar fracture of right tibia, subsequent encounter for closed fracture with routine healing: Secondary | ICD-10-CM | POA: Diagnosis not present

## 2017-09-24 DIAGNOSIS — M25661 Stiffness of right knee, not elsewhere classified: Secondary | ICD-10-CM | POA: Diagnosis not present

## 2017-09-26 DIAGNOSIS — M25661 Stiffness of right knee, not elsewhere classified: Secondary | ICD-10-CM | POA: Diagnosis not present

## 2017-09-26 DIAGNOSIS — M25531 Pain in right wrist: Secondary | ICD-10-CM | POA: Diagnosis not present

## 2017-10-07 ENCOUNTER — Ambulatory Visit: Payer: Medicare Other | Admitting: Family Medicine

## 2017-10-08 MED FILL — sulfaSALAzine 500 MG TBEC: 500 | 30 days supply | Qty: 120 | Fill #1

## 2017-10-15 ENCOUNTER — Other Ambulatory Visit: Payer: Self-pay | Admitting: Family Medicine

## 2017-10-15 MED FILL — HYDROXYCHLOROQUINE 200 MG T: 200 | 30 days supply | Qty: 30 | Fill #1

## 2017-10-15 NOTE — Progress Notes (Addendum)
HPI:  [name] is a pleasant 67 y.o. here for follow up. Chronic medical problems summarized below were reviewed for changes and stability and were updated as needed below. These issues and their treatment remain stable for the most part.  However, she unfortunately did suffer a fall in November with hospitalization from November 28 - July 23, 2017 with tibial plateau, right radial maxillary fractures.  She has not followed up with [place] since then.  There was a new microcytic anemia noted at the time. Feels has healed well. Still struggles with swelling and pain in feet - has seen many specialists about this. Compression, pumping legs helps. She doesn't feel that gabapentin helps on 5oomg total daily. She feels salt dos worsen it. She worries about other foods that may contribute or other treatments. She reports neck is a bit better but not resolved. She sees her neurologist soon. No reported CP, SOB, DOE..  Due for recheck CBC, mammogram, annual wellness visit  Neck mass: -Seeing Dr. Ernesto Rutherford, ENT -Has been treated with several courses of antibiotics for possible reactive lymph node -intermittent and chronic and reports had imaging and biopsy in the past  New anemia: -Starting 06/2017 during hospitalization or fracture -Microcytic  Hypertension/chronic lower extremity edema: -Medications: Aspirin, Toprol-XL, losartan, Lasix  Morbid obesity: -Very little regular activity, poor diet  GERD: -Taking Zantac  Hypercalcemia: -Seeing endocrinologist  Rheumatoid arthritis, osteoarthritis: -Seeing rheumatologist (Dr. Amil Amen )for management -Medications: Plaquenil and sulfasalazine  Neuropathy:  -Seeing neurology -History degenerative disc disease and of L5-S1 lumbar fusion and decompression with Dr. Vertell Limber in 2018 -chronic pain, swelling and redness in the feet -Not felt to be vascular by cardiologist, did not tolerate Lyrica or higher doses of gabapentin, did not tolerate  nortriptyline -Underwent treatment for possible lymphedema, did not help    ROS: See pertinent positives and negatives per HPI.  Past Medical History:  Diagnosis Date  . Allergy   . Cataract    BILATERAL-REMOVED 2 YEARS AGO  . GERD (gastroesophageal reflux disease)   . Hx of colonic polyp - ssp 11/03/2014  . Hypercalcemia   . Hypertension   . 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 Osteopenia 10/17/2011   DEXA 09/2007: -1.4 L fem; 10/2011: -1.2 L fem   . PONV (postoperative nausea and vomiting)   . Pseudogout of foot   . Rheumatoid arthritis(714.0) dx 2010    Past Surgical History:  Procedure Laterality Date  . BREAST BIOPSY  1972  . Broken wrist  2010  . CATARACT EXTRACTION  03/2012   left  . COLONOSCOPY    . COSMETIC SURGERY    . FRACTURE SURGERY    . INNER EAR SURGERY     busted ear drum  . MAXIMUM ACCESS (MAS)POSTERIOR LUMBAR INTERBODY FUSION (PLIF) 1 LEVEL N/A 10/11/2016   Procedure: Lumbar one-Sacral one Maximum access posterior lumbar interbody fusion;  Surgeon: Erline Levine, MD;  Location: [facility];  Service: Neurosurgery;  Laterality: N/A;  . ORIF WRIST FRACTURE Right 07/18/2017   Procedure: OPEN REDUCTION INTERNAL FIXATION (ORIF) WRIST FRACTURE;  Surgeon: Renette Butters, MD;  Location: [place] [facility] [place];  Service: Orthopedics;  Laterality: Right;  . pneumonia  2007    Family History  Problem Relation Age of Onset  . Stroke Mother   . Hypertension Father   . Heart attack Father   . Hypertension Sister        x 3  . Hypertension Brother   . Hyperthyroidism Sister  x2, s/p RAI ablation  . Hypothyroidism Brother     Social History   Socioeconomic History  . Marital status: Married    Spouse name: None  . Number of children: 3  . Years of education: None  . Highest education level: None  Social Needs  . Financial resource strain: None  . Food insecurity - worry: None  . Food insecurity - inability: None  . Transportation needs - medical: None  . Transportation  needs - non-medical: None  Occupational History  . Occupation: Retired  Tobacco Use  . Smoking status: Former Smoker    Packs/day: 4.00    Years: 4.00    Pack years: 16.00    Types: Cigarettes    Last attempt to quit: 1985    Years since quitting: 34.1  . Smokeless tobacco: Former Systems developer    Quit date: 08/19/1981  Substance and Sexual Activity  . Alcohol use: No    Alcohol/week: 0.0 oz  . Drug use: No  . Sexual activity: None  Other Topics Concern  . None  Social History Narrative   Artist -retired Building control surveyor   Married, lives with spouse, [name], he is IT support for [employer]   3 sons   2 caffeinated beverages a day   No regular exercise, diet is ok     Current Outpatient Medications:  .  aspirin 81 MG tablet, Take 81 mg by mouth at bedtime. , Disp: , Rfl:  .  Cholecalciferol (VITAMIN D-3) 5000 units TABS, Take 1 tablet by mouth daily., Disp: , Rfl:  .  furosemide (LASIX) 20 MG tablet, TAKE 1 TABLET BY MOUTH ONCE DAILY, Disp: 30 tablet, Rfl: 5 .  gabapentin (NEURONTIN) 100 MG capsule, Take 2 capsules (200 mg total) by mouth daily. AM, Disp: , Rfl:  .  gabapentin (NEURONTIN) 300 MG capsule, Take 1 capsule (300 mg total) by mouth at bedtime., Disp: 90 capsule, Rfl: 3 .  guaiFENesin (MUCINEX) 600 MG 12 hr tablet, Take 600 mg by mouth 2 (two) times daily as needed for cough or to loosen phlegm. , Disp: , Rfl:  .  hydroxychloroquine (PLAQUENIL) 200 MG tablet, Take 200 mg by mouth daily. Patient is currently taking, Disp: , Rfl:  .  losartan (COZAAR) 100 MG tablet, Take 1 tablet (100 mg total) by mouth daily., Disp: 90 tablet, Rfl: 3 .  metoprolol succinate (TOPROL-XL) 50 MG 24 hr tablet, TAKE 2 TABLETS BY MOUTH DAILY. TAKE WITH OR IMMEDIATELY FOLLOWING A MEAL, Disp: 180 tablet, Rfl: 1 .  polyethylene glycol (MIRALAX / GLYCOLAX) packet, Take 17 g by mouth daily as needed., Disp: 14 each, Rfl: 0 .  ranitidine (ZANTAC) 150 MG tablet, Take 150 mg by mouth daily as  needed for heartburn. , Disp: , Rfl:  .  senna-docusate (SENOKOT-S) 8.6-50 MG tablet, Take 1 tablet by mouth 2 (two) times daily., Disp: , Rfl:  .  sulfaSALAzine (AZULFIDINE) 500 MG EC tablet, TAKE 2 TABLETS BY MOUTH TWICE DAILY, Disp: 120 tablet, Rfl: 0 .  tiZANidine (ZANAFLEX) 4 MG tablet, Take 0.5-1 tablets (2-4 mg total) by mouth every 6 (six) hours as needed for muscle spasms., Disp: 60 tablet, Rfl: 1 .  Vit B6-Vit B12-Omega 3 Acids (VITAMIN B PLUS+ PO), Take 1 tablet by mouth every evening., Disp: , Rfl:  .  DULoxetine (CYMBALTA) 20 MG capsule, Take 1 capsule (20 mg total) by mouth daily., Disp: 30 capsule, Rfl: 3  EXAM:  Vitals:   10/16/17 1122  BP:  140/80  Pulse: 73  Temp: 98.3 F (36.8 C)    Body mass index is 37.9 kg/m.  GENERAL: vitals reviewed and listed above, alert, oriented, appears well hydrated and in no acute distress  HEENT: atraumatic, conjunttiva clear, no obvious abnormalities on inspection of external nose and ears  NECK: no obvious masses on inspection  LUNGS: clear to auscultation bilaterally, no wheezes, rales or rhonchi, good air movement  CV: HRRR, bilat lower ext ankle peripheral edema with some erythema in feet/ankles  MS: moves all extremities without noticeable abnormality  PSYCH: pleasant and cooperative, no obvious depression or anxiety  ASSESSMENT AND PLAN:  Discussed the following assessment and plan:  Anemia, unspecified type - Plan: CBC -Discussed we may need to do further workup  if anemia persist on labs  Class 2 severe obesity due to excess calories with serious comorbidity and body mass index (BMI) of 37.0 to 37.9 in adult ([place]) -We discussed healthy diet at length including healthy snacks, healthy breakfast and ways to eat healthy if she does decide to try eliminating certain foods to see if this helps her health-recommended a healthy low sugar diet with healthy proteins, whole grains, avoidance of processed foods, high sugar and  high salt foods  Rheumatoid arthritis, involving unspecified site, unspecified rheumatoid factor presence ([place]) -Sees specialist for management  Hyperparathyroidism ([place]) -Sees specialist for management  Other chronic pain Leg edema Neuropathy -She is seeing a number of specialists including cardiovascular, vascular, or neurology and orthopedics about this -She is mainly concerned about the discomfort she has in her feet, a number of things have not helped -Compression and pumping her legs to help with the fluid seems to help some, also avoiding salt -We talked about other treatment options for chronic pain and mention Cymbalta, seems her neurologist has recommended this as well and she wants to try this -She does not think the gabapentin is helping, so she has opted to try tapering off of this and then start Cymbalta -I did tell her to let her neurologist no she will be trying this, sent prescription, recommended she follow-up in 1 month   Essential hypertension -Stable  Due for annual wellness visit, she agrees to schedule.  Also advised to schedule mammogram.  Recommended that she follow-up with her ear nose and throat doctor about the neck, given she feels it has not completely resolved.  Patient Instructions  BEFORE YOU LEAVE: -lab -follow up: AWV with Manuela Schwartz and follow up with Dr. Maudie Mercury in 1 month  Follow up with your Ear, Nose and Throat doctor.  Please schedule your mammogram.  If you decide to try the Cymbalta instead of the Neurontin, Please taper off of the gabapentin over 1 week. Then start the cymbalta.  If you want to try dietary changes, try eliminating gluten for 2 weeks, then dairy, etc. Ensure you are eating a healthy diet with health protein at each meal (fish, lean [place] breast or lean chicken breast, vegan protein), lots of veggies, whole grains, etc.   We recommend the following healthy lifestyle for LIFE: 1) Small portions. But, make sure to get regular  (at least 3 per day), healthy meals and small healthy snacks if needed.  2) Eat a healthy clean diet.   TRY TO EAT: -at least 5-7 servings of low sugar, colorful, and nutrient rich vegetables per day (not corn, potatoes or bananas.) -berries are the best choice if you wish to eat fruit (only eat small amounts if trying to reduce weight)  -  lean meets (fish, white meat of chicken or [place]) -vegan proteins for some meals - beans or tofu, whole grains, nuts and seeds -Replace bad fats with good fats - good fats include: fish, nuts and seeds, canola oil, olive oil -small amounts of low fat or non fat dairy -small amounts of100 % whole grains - check the lables -drink plenty of water  AVOID: -SUGAR, sweets, anything with added sugar, corn syrup or sweeteners - must read labels as even foods advertised as "healthy" often are loaded with sugar -if you must have a sweetener, small amounts of stevia may be best -sweetened beverages and artificially sweetened beverages -simple starches (rice, bread, potatoes, pasta, chips, etc - small amounts of 100% whole grains are ok) -red meat, pork, butter -fried foods, fast food, processed food, excessive dairy, eggs and coconut.  3)Get at least 150 minutes of sweaty aerobic exercise per week.  4)Reduce stress - consider counseling, meditation and relaxation to balance other aspects of your life.     Lucretia Kern, DO

## 2017-10-16 ENCOUNTER — Ambulatory Visit: Payer: 59 | Admitting: Family Medicine

## 2017-10-16 ENCOUNTER — Encounter: Payer: Self-pay | Admitting: Family Medicine

## 2017-10-16 VITALS — BP 140/80 | HR 73 | Temp 98.3°F | Ht 66.0 in | Wt 234.8 lb

## 2017-10-16 DIAGNOSIS — Z6837 Body mass index (BMI) 37.0-37.9, adult: Secondary | ICD-10-CM

## 2017-10-16 DIAGNOSIS — G8929 Other chronic pain: Secondary | ICD-10-CM | POA: Diagnosis not present

## 2017-10-16 DIAGNOSIS — I10 Essential (primary) hypertension: Secondary | ICD-10-CM | POA: Diagnosis not present

## 2017-10-16 DIAGNOSIS — D649 Anemia, unspecified: Secondary | ICD-10-CM

## 2017-10-16 DIAGNOSIS — R600 Localized edema: Secondary | ICD-10-CM

## 2017-10-16 DIAGNOSIS — E213 Hyperparathyroidism, unspecified: Secondary | ICD-10-CM | POA: Diagnosis not present

## 2017-10-16 DIAGNOSIS — M069 Rheumatoid arthritis, unspecified: Secondary | ICD-10-CM

## 2017-10-16 DIAGNOSIS — E66812 Obesity, class 2: Secondary | ICD-10-CM

## 2017-10-16 DIAGNOSIS — G629 Polyneuropathy, unspecified: Secondary | ICD-10-CM | POA: Diagnosis not present

## 2017-10-16 LAB — CBC
HEMATOCRIT: 35.8 % — AB (ref 36.0–46.0)
Hemoglobin: 11.6 g/dL — ABNORMAL LOW (ref 12.0–15.0)
MCHC: 32.5 g/dL (ref 30.0–36.0)
MCV: 73.8 fl — ABNORMAL LOW (ref 78.0–100.0)
Platelets: 258 10*3/uL (ref 150.0–400.0)
RBC: 4.85 Mil/uL (ref 3.87–5.11)
RDW: 16.5 % — AB (ref 11.5–15.5)
WBC: 5.1 10*3/uL (ref 4.0–10.5)

## 2017-10-16 MED ORDER — DULOXETINE HCL 20 MG PO CPEP: 20.0000 mg | ORAL_CAPSULE | Freq: Every day | ORAL | 3 refills | Status: DC

## 2017-10-16 MED FILL — FUROSEMIDE 20 MG TABS: 20 | 90 days supply | Qty: 90 | Fill #0

## 2017-10-16 MED FILL — DULoxetine HCL 20 MG CPEP: 20 | 30 days supply | Qty: 30 | Fill #0

## 2017-10-16 NOTE — Patient Instructions (Addendum)
BEFORE YOU LEAVE: -lab -follow up: AWV with [name] and follow up with Dr. Maudie Mercury in 1 month  Follow up with your Ear, Nose and Throat doctor.  Please schedule your mammogram.  If you decide to try the Cymbalta instead of the Neurontin, Please taper off of the gabapentin over 1 week. Then start the cymbalta.  If you want to try dietary changes, try eliminating gluten for 2 weeks, then dairy, etc. Ensure you are eating a healthy diet with health protein at each meal (fish, lean [place] breast or lean chicken breast, vegan protein), lots of veggies, whole grains, etc.   We recommend the following healthy lifestyle for LIFE: 1) Small portions. But, make sure to get regular (at least 3 per day), healthy meals and small healthy snacks if needed.  2) Eat a healthy clean diet.   TRY TO EAT: -at least 5-7 servings of low sugar, colorful, and nutrient rich vegetables per day (not corn, potatoes or bananas.) -berries are the best choice if you wish to eat fruit (only eat small amounts if trying to reduce weight)  -lean meets (fish, white meat of chicken or [place]) -vegan proteins for some meals - beans or tofu, whole grains, nuts and seeds -Replace bad fats with good fats - good fats include: fish, nuts and seeds, canola oil, olive oil -small amounts of low fat or non fat dairy -small amounts of100 % whole grains - check the lables -drink plenty of water  AVOID: -SUGAR, sweets, anything with added sugar, corn syrup or sweeteners - must read labels as even foods advertised as "healthy" often are loaded with sugar -if you must have a sweetener, small amounts of stevia may be best -sweetened beverages and artificially sweetened beverages -simple starches (rice, bread, potatoes, pasta, chips, etc - small amounts of 100% whole grains are ok) -red meat, pork, butter -fried foods, fast food, processed food, excessive dairy, eggs and coconut.  3)Get at least 150 minutes of sweaty aerobic exercise per  week.  4)Reduce stress - consider counseling, meditation and relaxation to balance other aspects of your life.

## 2017-10-17 ENCOUNTER — Encounter: Payer: Self-pay | Admitting: Neurology

## 2017-10-17 ENCOUNTER — Ambulatory Visit: Payer: 59 | Admitting: Neurology

## 2017-10-17 VITALS — BP 160/90 | HR 72 | Ht 66.0 in | Wt 234.1 lb

## 2017-10-17 DIAGNOSIS — I7381 Erythromelalgia: Secondary | ICD-10-CM | POA: Diagnosis not present

## 2017-10-17 DIAGNOSIS — G629 Polyneuropathy, unspecified: Secondary | ICD-10-CM | POA: Diagnosis not present

## 2017-10-17 NOTE — Patient Instructions (Signed)
Increase gabapentin 300mg  twice daily.  You can add extra 100-200mg  daily.  Please send me an update via MyChart in  2 weeks and let me know how the new dose of gabapentin is working for you  Return to clinic in 3 months

## 2017-10-17 NOTE — Progress Notes (Signed)
Follow-up Visit   Date: 10/17/17    [name] MRN: [id number] DOB: 1951-07-29   Interim History: [name] is a 67 y.o. right-handed Caucasian female with hypertension, GERD, rheumatoid arthritis, status post L5-S1 lumbar fusion and decompression returning to the clinic for follow-up of neuropathy.  The patient was accompanied to the clinic by self.   History of present illness: Starting 2013, she developed redness and swelling of the toes and feet, which progressed into burning and firey sensation of the toes.  She also has numbness of the feet. Her toes can swell so much that it prohibits her from moving her toes at times.   She gets significant relief of symptoms with soaking her feet in ice water.  Activity and heat exacerbates her symptoms.  She has previously seen cardiology who did not feel symptoms were vascular in origin.  She went to [facility] and started her on gabapentin 200mg  twice daily, which provides some relief.   She was unable to tolerate any higher dose due to sedation.  She did not tolerate Lyrica due to hair loss. She started vitamin B12 1041mcg and B1 for neuropathy after reading that this may help.   She was diagnosed with rheumatoid arthritis around the same time that her feet started to change color and cause pain.  She takes a daily aspirin, but does not appreciate that this improves her pain any.  She sees Dr. Amil Amen for RA and takes plaquenil and sulfasalazine.   She has long history of low back and bilateral leg pain worse on the right which was exacerbated in 2017 due to a slip. Around that time, she started having right leg shooting pain radiating from her back to her knee. MRI lumbar spine from August 2017 showed narrowing at left L3-4, mild arthritis at L4-5 left>right, severe arthritis at L5-S1 with L5 nerve impingement on the right.  Due to ongoing pain, she underwent decompression and fusion L5-S1 with significant improvement in  back and leg pain  on 10/11/2016 by Dr. Vertell Limber and is very pleased with her results.  UPDATE 04/04/2017: She is here for follow-up of neuropathy.  Since her last visit, she has not noticed any change in the severity of her pain and she is able to function with her discomfort more.  She stopped nortriptyline because of tremors.  She is taking gabapentin 200mg  twice daily and is not having side effects, but continues to have firey pain in her feet.    UPDATE 06/13/2017:  She scheduled sooner follow-up visit because of increased neuropathic pain of the feet.  She takes gabapentin 200mg  twice daily.  Last night, she tried taking 300 mg at bedtime, which helped alleviate her pain and denies having any adverse cognitive side effects.  The swelling and redness of her feet remains unchanged. She denies any falls or hospitalizations.    UPDATE 10/17/2017:  She is here for 3 month follow-up visit. She suffered a traumatic fall in 11/28 and fracture her face, right radius, and right tibia after falling down the stairs as she was trying to bring her laundry down.  She has recovered well from this.  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  She carries ice packs with her and purchased an ice maker, because she depends on keeping her feet cool.  She is taking gabapentin 200mg  in the morning and 300mg  at bedtime, which eases her pain some.  On severe days, she  takes an extra 200mg /d which helps.  She started an OTC b-complex vitamin which has helped her neuropathic pain.    Medications:  Current Outpatient Medications on File Prior to Visit  Medication Sig Dispense Refill  . aspirin 81 MG tablet Take 81 mg by mouth at bedtime.     . Cholecalciferol (VITAMIN D-3) 5000 units TABS Take 1 tablet by mouth daily.    . DULoxetine (CYMBALTA) 20 MG capsule Take 1 capsule (20 mg total) by mouth daily. 30 capsule 3  . furosemide (LASIX) 20 MG tablet TAKE 1 TABLET BY MOUTH ONCE DAILY 30  tablet 5  . gabapentin (NEURONTIN) 100 MG capsule Take 2 capsules (200 mg total) by mouth daily. AM    . gabapentin (NEURONTIN) 300 MG capsule Take 1 capsule (300 mg total) by mouth at bedtime. 90 capsule 3  . guaiFENesin (MUCINEX) 600 MG 12 hr tablet Take 600 mg by mouth 2 (two) times daily as needed for cough or to loosen phlegm.     . hydroxychloroquine (PLAQUENIL) 200 MG tablet Take 200 mg by mouth daily. Patient is currently taking    . losartan (COZAAR) 100 MG tablet Take 1 tablet (100 mg total) by mouth daily. 90 tablet 3  . metoprolol succinate (TOPROL-XL) 50 MG 24 hr tablet TAKE 2 TABLETS BY MOUTH DAILY. TAKE WITH OR IMMEDIATELY FOLLOWING A MEAL 180 tablet 1  . polyethylene glycol (MIRALAX / GLYCOLAX) packet Take 17 g by mouth daily as needed. 14 each 0  . ranitidine (ZANTAC) 150 MG tablet Take 150 mg by mouth daily as needed for heartburn.     . senna-docusate (SENOKOT-S) 8.6-50 MG tablet Take 1 tablet by mouth 2 (two) times daily.    Marland Kitchen sulfaSALAzine (AZULFIDINE) 500 MG EC tablet TAKE 2 TABLETS BY MOUTH TWICE DAILY 120 tablet 0  . tiZANidine (ZANAFLEX) 4 MG tablet Take 0.5-1 tablets (2-4 mg total) by mouth every 6 (six) hours as needed for muscle spasms. 60 tablet 1  . Vit B6-Vit B12-Omega 3 Acids (VITAMIN B PLUS+ PO) Take 1 tablet by mouth every evening.     No current facility-administered medications on file prior to visit.     Allergies:  Allergies  Allergen Reactions  . Amlodipine Besylate Other (See Comments)    Tremors  . Nortriptyline Other (See Comments)    tremors  . Dilaudid [Hydromorphone Hcl]     Headache, muscle tightness    Review of Systems:  CONSTITUTIONAL: No fevers, chills, night sweats, or weight loss.  EYES: No visual changes or eye pain ENT: No hearing changes.  No history of nose bleeds.   RESPIRATORY: No cough, wheezing and shortness of breath.   CARDIOVASCULAR: Negative for chest pain, and palpitations.   GI: Negative for abdominal discomfort,  blood in stools or black stools.  No recent change in bowel habits.   GU:  No history of incontinence.   MUSCLOSKELETAL: +history of joint pain +swelling.  No myalgias.   SKIN: Negative for lesions, rash, and itching.   ENDOCRINE: Negative for cold or heat intolerance, polydipsia or goiter.   PSYCH:  No depression or anxiety symptoms.   NEURO: As Above.   Vital Signs:  BP (!) 160/90   Pulse 72   Ht 5\' 6"  (1.676 m)   Wt 234 lb 2 oz (106.2 kg)   SpO2 94%   BMI 37.79 kg/m  General:  Well-appearing  Neurological Exam: MENTAL STATUS including orientation to time, place, person, recent and remote memory, attention span  and concentration, language, and fund of knowledge is normal.  Speech is not dysarthric.  CRANIAL NERVES:  Face is symmetric.  MOTOR:  Motor strength is 5/5 in all extremities. Tone is normal.  Increased erythema of the feet and fingers.   SENSORY:  Vibration is reduced distal to ankles bilaterally.  Temperature is intact.  COORDINATION/GAIT:   Gait narrow based and stable.   Data: MRI lumbar spine 03/19/2016: 1. Severe facet arthrosis at L5-S1 with grade 1 anterolisthesis and moderate right lateral recess stenosis, potentially affecting the right S1 nerve. 2. Mild right and moderate left lateral recess stenosis at L4-5. 3. Moderate left lateral recess stenosis at L3-4.  EKG 10/10/2016:  QTc 442  LAbs 12/18/2016:  TSH 0.89, vitamin B12 405, copper 91, SPEP with IFE no M protein  NCS/EMG of the legs 01/02/2017:   This study was technically challenged and limited by lower extremity edema.  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  Clinical correlation recommended.  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 Previously tried:  Lyrica (swelling), nortriptyline (tremors).   We  discussed management options at length, including adding Cymbalta vs titrating gabapentin as she is having relief with it. Currently, she takes gabapentin 200mg  in the morning and 300mg  at bedtime which does not cause sedation.  Recommend increasing the dose to 300mg  twice daily and add extra 100-200mg  in the afternoon as needed for pain, as long as she does not develop cognitive side effects She was asked to send me an update via MyChart in 2 weeks.  If she does not tolerate titration of gabapentin, Cymbalta will be the next step She has been on aspirin daily which does not provide any relief with respect to erythromelalgia  Return to clinic 3 months  Greater than 50% of this 25 minute visit was spent in counseling, explanation of diagnosis, planning of further management, and coordination of care.   Thank you for allowing me to participate in patient's care.  If I can answer any additional questions, I would be pleased to do so.    Sincerely,    Danisa Kopec K. Posey Pronto, DO

## 2017-10-28 NOTE — Addendum Note (Signed)
Addended by: Agnes Lawrence on: 10/28/2017 05:41 PM   Modules accepted: Orders

## 2017-10-30 ENCOUNTER — Encounter: Payer: Self-pay | Admitting: Internal Medicine

## 2017-11-03 ENCOUNTER — Ambulatory Visit: Payer: Medicare Other | Admitting: Internal Medicine

## 2017-11-03 MED FILL — sulfaSALAzine 500 MG TBEC: 500 | 30 days supply | Qty: 120 | Fill #2

## 2017-11-12 DIAGNOSIS — D3704 Neoplasm of uncertain behavior of the minor salivary glands: Secondary | ICD-10-CM | POA: Diagnosis not present

## 2017-11-12 DIAGNOSIS — J3081 Allergic rhinitis due to animal (cat) (dog) hair and dander: Secondary | ICD-10-CM | POA: Diagnosis not present

## 2017-11-12 DIAGNOSIS — H6062 Unspecified chronic otitis externa, left ear: Secondary | ICD-10-CM | POA: Diagnosis not present

## 2017-11-12 DIAGNOSIS — H6122 Impacted cerumen, left ear: Secondary | ICD-10-CM | POA: Diagnosis not present

## 2017-11-12 DIAGNOSIS — J301 Allergic rhinitis due to pollen: Secondary | ICD-10-CM | POA: Diagnosis not present

## 2017-11-12 MED FILL — NEOMYCIN-POLYMYXIN-HC EAR S: 3.5-10000-1 | 7 days supply | Qty: 10 | Fill #0

## 2017-11-18 ENCOUNTER — Ambulatory Visit: Payer: 59

## 2017-11-18 ENCOUNTER — Encounter: Payer: Self-pay | Admitting: Family Medicine

## 2017-11-18 ENCOUNTER — Ambulatory Visit: Payer: 59 | Admitting: Family Medicine

## 2017-11-18 VITALS — BP 136/90 | HR 82 | Temp 98.0°F | Ht 66.0 in | Wt 237.6 lb

## 2017-11-18 DIAGNOSIS — M79672 Pain in left foot: Secondary | ICD-10-CM

## 2017-11-18 DIAGNOSIS — R600 Localized edema: Secondary | ICD-10-CM | POA: Diagnosis not present

## 2017-11-18 DIAGNOSIS — I10 Essential (primary) hypertension: Secondary | ICD-10-CM

## 2017-11-18 DIAGNOSIS — M79671 Pain in right foot: Secondary | ICD-10-CM | POA: Diagnosis not present

## 2017-11-18 MED ORDER — FUROSEMIDE 20 MG PO TABS: 30.0000 mg | ORAL_TABLET | Freq: Every day | ORAL | 5 refills | Status: DC

## 2017-11-18 MED FILL — HYDROXYCHLOROQUINE 200 MG T: 200 | 30 days supply | Qty: 30 | Fill #2

## 2017-11-18 NOTE — Patient Instructions (Signed)
BEFORE YOU LEAVE: -follow up: Annual wellness visit with Manuela Schwartz and follow-up with Dr. Maudie Mercury in 3-4 weeks -we will plan on doing some labs at this visit.  Increase the Lasix to 30 mg daily.  Taper the gabapentin over the next 1 week.  1 tablet in the morning and 2 tablets at night for 2-3 days.  Then, 1 tablet in the morning and 1 tablet at night for 2-3 days.  Then, 1 tablet at night for 2-3 days.  Then, stop.  Then, start the Cymbalta.  Take once daily.  Elevate legs for 30 minutes twice daily.  Stop all topical creams, lotions and new skin.  Start Aquaphor and Lotrimin cream twice daily.  I sent a referral to the podiatrist.  Please call us if somebody has not contact you about this appointment in the next few days.

## 2017-11-18 NOTE — Progress Notes (Signed)
HPI:  Using dictation device. Unfortunately this device frequently misinterprets words/phrases.  Follow up chronic pain in feet. Seeing numerous specialist about this (rhuem, neuro, vasc). At her visit with me about a month ago she opted to wean off Neurontin and try cymbalta. She was supposed to see me for follow up and do her AWV with Manuela Schwartz in one month. Today reports did not wean off the gabapentin entirely as was not sure how to do that.  She is down to 2 tablets in the morning and 2 at night.  She does not feel that decreasing it has helped or worsened her condition.  She has not started the Cymbalta.  She has had some sores on her toes in some places.  She puts a lot of things on her feet to try to help including several creams and new skin.  None of this has helped.  She is not using compression as she has not been able to get this on.  She continues to have some swelling in the feet and ankles. We also advised a very healthy diet and trying to get some gentle exercise.  She also had improved anemia on labs. We recommended stool cards, healthy diet and recheck in 3 months. She did not do stool cards as reports chronic hemorrhoids. Now is due for her repeat colonoscopy per review of chart.   ROS: See pertinent positives and negatives per HPI.  Past Medical History:  Diagnosis Date  . Allergy   . Cataract    BILATERAL-REMOVED 2 YEARS AGO  . GERD (gastroesophageal reflux disease)   . Hx of colonic polyp - ssp 11/03/2014  . Hypercalcemia   . Hypertension   . 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 Osteopenia 10/17/2011   DEXA 09/2007: -1.4 L fem; 10/2011: -1.2 L fem   . PONV (postoperative nausea and vomiting)   . Pseudogout of foot   . Rheumatoid arthritis(714.0) dx 2010    Past Surgical History:  Procedure Laterality Date  . BREAST BIOPSY  1972  . Broken wrist  2010  . CATARACT EXTRACTION  03/2012   left  . COLONOSCOPY    . COSMETIC SURGERY    . FRACTURE SURGERY    . INNER EAR  SURGERY     busted ear drum  . MAXIMUM ACCESS (MAS)POSTERIOR LUMBAR INTERBODY FUSION (PLIF) 1 LEVEL N/A 10/11/2016   Procedure: Lumbar one-Sacral one Maximum access posterior lumbar interbody fusion;  Surgeon: Erline Levine, MD;  Location: [facility];  Service: Neurosurgery;  Laterality: N/A;  . ORIF WRIST FRACTURE Right 07/18/2017   Procedure: OPEN REDUCTION INTERNAL FIXATION (ORIF) WRIST FRACTURE;  Surgeon: Renette Butters, MD;  Location: [facility];  Service: Orthopedics;  Laterality: Right;  . pneumonia  2007    Family History  Problem Relation Age of Onset  . Stroke Mother   . Hypertension Father   . Heart attack Father   . Hypertension Sister        x 3  . Hypertension Brother   . Hyperthyroidism Sister        x2, s/p RAI ablation  . Hypothyroidism Brother     SOCIAL HX:    Current Outpatient Medications:  .  aspirin 81 MG tablet, Take 81 mg by mouth at bedtime. , Disp: , Rfl:  .  Cholecalciferol (VITAMIN D-3) 5000 units TABS, Take 1 tablet by mouth daily., Disp: , Rfl:  .  DULoxetine (CYMBALTA) 20 MG capsule, Take 1 capsule (20 mg total) by mouth daily., Disp:  30 capsule, Rfl: 3 .  furosemide (LASIX) 20 MG tablet, Take 1.5 tablets (30 mg total) by mouth daily., Disp: 45 tablet, Rfl: 5 .  guaiFENesin (MUCINEX) 600 MG 12 hr tablet, Take 600 mg by mouth 2 (two) times daily as needed for cough or to loosen phlegm. , Disp: , Rfl:  .  hydroxychloroquine (PLAQUENIL) 200 MG tablet, Take 200 mg by mouth daily. Patient is currently taking, Disp: , Rfl:  .  losartan (COZAAR) 100 MG tablet, Take 1 tablet (100 mg total) by mouth daily., Disp: 90 tablet, Rfl: 3 .  metoprolol succinate (TOPROL-XL) 50 MG 24 hr tablet, TAKE 2 TABLETS BY MOUTH DAILY. TAKE WITH OR IMMEDIATELY FOLLOWING A MEAL, Disp: 180 tablet, Rfl: 1 .  polyethylene glycol (MIRALAX / GLYCOLAX) packet, Take 17 g by mouth daily as needed., Disp: 14 each, Rfl: 0 .  ranitidine (ZANTAC) 150 MG tablet, Take 150 mg by mouth daily as needed  for heartburn. , Disp: , Rfl:  .  senna-docusate (SENOKOT-S) 8.6-50 MG tablet, Take 1 tablet by mouth 2 (two) times daily., Disp: , Rfl:  .  sulfaSALAzine (AZULFIDINE) 500 MG EC tablet, TAKE 2 TABLETS BY MOUTH TWICE DAILY, Disp: 120 tablet, Rfl: 0 .  tiZANidine (ZANAFLEX) 4 MG tablet, Take 0.5-1 tablets (2-4 mg total) by mouth every 6 (six) hours as needed for muscle spasms., Disp: 60 tablet, Rfl: 1 .  Vit B6-Vit B12-Omega 3 Acids (VITAMIN B PLUS+ PO), Take 1 tablet by mouth every evening., Disp: , Rfl:   EXAM:  Vitals:   11/18/17 1505 11/18/17 1507  BP: 136/90 136/90  Pulse: 82   Temp: 98 F (36.7 C)     Body mass index is 38.35 kg/m.  GENERAL: vitals reviewed and listed above, alert, oriented, appears well hydrated and in no acute distress  HEENT: atraumatic, conjunttiva clear, no obvious abnormalities on inspection of external nose and ears  NECK: no obvious masses on inspection  LUNGS: clear to auscultation bilaterally, no wheezes, rales or rhonchi, good air movement  CV: HRRR, bilat ankle and feet edema  SKIN: erythema of skin on feet, several calluses with minimal skin break down in several pressure point areas, dry scaly skin on feet  MS: moves all extremities without noticeable abnormality  PSYCH: pleasant and cooperative, no obvious depression or anxiety  ASSESSMENT AND PLAN:  Discussed the following assessment and plan:  Pain in both feet - Plan: Ambulatory referral to Podiatry  Leg edema  Essential hypertension  -refer podiatry - needs at minimum some help with foot wear - perhaps fresh eyes on the situation with her foot as nothing has offered her relief -she is going to taper of gabapentin and try cymbalta for pain -opted to eliminate current topical products in case she is reacting to something in one of these - trial lotrimin and aquaphor instead -elevation for edema and increassing lasix for this and bp up a little today -close follow up with renal  check/AWV then advised -advise again healthy diet  -Patient advised to return or notify a doctor immediately if symptoms worsen or persist or new concerns arise.  Patient Instructions  BEFORE YOU LEAVE: -follow up: Annual wellness visit with Manuela Schwartz and follow-up with Dr. Maudie Mercury in 3-4 weeks -we will plan on doing some labs at this visit.  Increase the Lasix to 30 mg daily.  Taper the gabapentin over the next 1 week.  1 tablet in the morning and 2 tablets at night for 2-3 days.  Then,  1 tablet in the morning and 1 tablet at night for 2-3 days.  Then, 1 tablet at night for 2-3 days.  Then, stop.  Then, start the Cymbalta.  Take once daily.  Elevate legs for 30 minutes twice daily.  Stop all topical creams, lotions and new skin.  Start Aquaphor and Lotrimin cream twice daily.  I sent a referral to the podiatrist.  Please call us if somebody has not contact you about this appointment in the next few days.    Lucretia Kern, DO

## 2017-11-20 MED FILL — METOPROLOL SUCC ER 50 MG TA: 50 | 90 days supply | Qty: 180 | Fill #1

## 2017-11-28 ENCOUNTER — Encounter: Payer: Self-pay | Admitting: Podiatry

## 2017-11-28 ENCOUNTER — Ambulatory Visit (INDEPENDENT_AMBULATORY_CARE_PROVIDER_SITE_OTHER): Payer: 59

## 2017-11-28 ENCOUNTER — Other Ambulatory Visit: Payer: Self-pay | Admitting: Podiatry

## 2017-11-28 ENCOUNTER — Ambulatory Visit: Payer: 59 | Admitting: Podiatry

## 2017-11-28 DIAGNOSIS — M779 Enthesopathy, unspecified: Secondary | ICD-10-CM

## 2017-11-28 DIAGNOSIS — G629 Polyneuropathy, unspecified: Secondary | ICD-10-CM | POA: Diagnosis not present

## 2017-11-30 NOTE — Progress Notes (Signed)
Subjective:   Patient ID: [name], female   DOB: 67 y.o.   MRN: [id number]   HPI Patient states she still has a lot of discoloration in her feet and she seen a number of doctors and is not been able to come up with any kind of a diagnosis as to her problem    ROS      Objective:  Physical Exam  Neurovascular status appears to be intact and patient has seen vascular physicians and neurologist.  Patient does have redness to her feet and she does get blistering between the toes that is been present for a long time and has been on gabapentin which she has trouble tolerating is getting ready to start Cymbalta     Assessment:  H&P condition reviewed and recommended that she seek a dermatologist and I have good to present this to the group and there is a possibility is autoimmune and she does see a rheumatologist and she will have to continue to work with him     Plan:  Reviewed the redness in her feet condition and we discussed treatment options with patient

## 2017-12-04 MED FILL — sulfaSALAzine 500 MG TBEC: 500 | 30 days supply | Qty: 120 | Fill #3

## 2017-12-11 ENCOUNTER — Encounter: Payer: Self-pay | Admitting: Family Medicine

## 2017-12-11 ENCOUNTER — Ambulatory Visit: Payer: 59 | Admitting: Family Medicine

## 2017-12-11 VITALS — BP 138/88 | HR 77 | Temp 98.4°F | Ht 66.0 in

## 2017-12-11 DIAGNOSIS — M069 Rheumatoid arthritis, unspecified: Secondary | ICD-10-CM | POA: Diagnosis not present

## 2017-12-11 DIAGNOSIS — I7381 Erythromelalgia: Secondary | ICD-10-CM

## 2017-12-11 DIAGNOSIS — R600 Localized edema: Secondary | ICD-10-CM

## 2017-12-11 DIAGNOSIS — M545 Low back pain: Secondary | ICD-10-CM | POA: Diagnosis not present

## 2017-12-11 LAB — POCT URINALYSIS DIPSTICK
Bilirubin, UA: NEGATIVE
Blood, UA: NEGATIVE
GLUCOSE UA: NEGATIVE
KETONES UA: NEGATIVE
Leukocytes, UA: NEGATIVE
Nitrite, UA: NEGATIVE
Protein, UA: NEGATIVE
Spec Grav, UA: 1.02 (ref 1.010–1.025)
Urobilinogen, UA: 0.2 E.U./dL
pH, UA: 5 (ref 5.0–8.0)

## 2017-12-11 MED ORDER — TIZANIDINE HCL 4 MG PO TABS: 2.0000 mg | ORAL_TABLET | Freq: Three times a day (TID) | ORAL | 0 refills | Status: DC | PRN

## 2017-12-11 MED FILL — tiZANidine HCL 4 MG TABS: 4 | 6 days supply | Qty: 20 | Fill #0

## 2017-12-11 NOTE — Patient Instructions (Signed)
BEFORE YOU LEAVE: -follow up: CANCEL appt coming up next week. SCHEDULE CPE with Dr. Maudie Mercury in 1 month.  Heat 15 minutes twice daily  Start the cymbalta and take once daily  Tylenol 500-1000mg  up to 3 times per day if needed  Topical menthol (tiger balm) if needed for pain  Tizanidine before bed as needed  Call you back doctor for appointment, particularly if worsening, new symptoms or you are not improving over the next few weeks.  I hope you are feeling better soon! Seek care promptly if your symptoms worsen, new concerns arise or you are not improving with treatment.

## 2017-12-11 NOTE — Progress Notes (Signed)
HPI:  Using dictation device. Unfortunately this device frequently misinterprets words/phrases.  Acute visit for L back pain: - started 4 to 5 days ago, she had been doing yard work prior, moderate  -Pain is in the bilateral low back, left greater than right -Denies radiation, weakness, numbness, fever, malaise, dysuria, bowel or bladder incontinence -She has a history of spinal stenosis, sees Dr. Vertell Limber, status post fusion surgery -She wondered if this could be a UTI -She tried a half of a muscle relaxer she had been Dr. Vertell Limber, but she is out of it now; Tylenol helps some -sees specialists for management RA and erythromelagia -morbid obesity - husband does the shopping, trying to eat better  Lower extremity edema: -She is taking 1/2 tablets of Lasix, she wonders if she can go to 40 mg -Feels it has helped, but still has some swelling and she does not like splitting the pills  ROS: See pertinent positives and negatives per HPI.  Past Medical History:  Diagnosis Date  . Allergy   . Cataract    BILATERAL-REMOVED 2 YEARS AGO  . GERD (gastroesophageal reflux disease)   . Hx of colonic polyp - ssp 11/03/2014  . Hypercalcemia   . Hypertension   . 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 Osteopenia 10/17/2011   DEXA 09/2007: -1.4 L fem; 10/2011: -1.2 L fem   . PONV (postoperative nausea and vomiting)   . Pseudogout of foot   . Rheumatoid arthritis(714.0) dx 2010    Past Surgical History:  Procedure Laterality Date  . BREAST BIOPSY  1972  . Broken wrist  2010  . CATARACT EXTRACTION  03/2012   left  . COLONOSCOPY    . COSMETIC SURGERY    . FRACTURE SURGERY    . INNER EAR SURGERY     busted ear drum  . MAXIMUM ACCESS (MAS)POSTERIOR LUMBAR INTERBODY FUSION (PLIF) 1 LEVEL N/A 10/11/2016   Procedure: Lumbar one-Sacral one Maximum access posterior lumbar interbody fusion;  Surgeon: Erline Levine, MD;  Location: [facility];  Service: Neurosurgery;  Laterality: N/A;  . ORIF WRIST FRACTURE Right  07/18/2017   Procedure: OPEN REDUCTION INTERNAL FIXATION (ORIF) WRIST FRACTURE;  Surgeon: Renette Butters, MD;  Location: [place];  Service: Orthopedics;  Laterality: Right;  . pneumonia  2007    Family History  Problem Relation Age of Onset  . Stroke Mother   . Hypertension Father   . Heart attack Father   . Hypertension Sister        x 3  . Hypertension Brother   . Hyperthyroidism Sister        x2, s/p RAI ablation  . Hypothyroidism Brother     SOCIAL HX: see hpi   Current Outpatient Medications:  .  aspirin 81 MG tablet, Take 81 mg by mouth at bedtime. , Disp: , Rfl:  .  Cholecalciferol (VITAMIN D-3) 5000 units TABS, Take 1 tablet by mouth daily., Disp: , Rfl:  .  DULoxetine (CYMBALTA) 20 MG capsule, Take 1 capsule (20 mg total) by mouth daily., Disp: 30 capsule, Rfl: 3 .  furosemide (LASIX) 20 MG tablet, Take 1.5 tablets (30 mg total) by mouth daily., Disp: 45 tablet, Rfl: 5 .  hydroxychloroquine (PLAQUENIL) 200 MG tablet, Take 200 mg by mouth daily. Patient is currently taking, Disp: , Rfl:  .  losartan (COZAAR) 100 MG tablet, Take 1 tablet (100 mg total) by mouth daily., Disp: 90 tablet, Rfl: 3 .  metoprolol succinate (TOPROL-XL) 50 MG 24 hr tablet, TAKE  2 TABLETS BY MOUTH DAILY. TAKE WITH OR IMMEDIATELY FOLLOWING A MEAL, Disp: 180 tablet, Rfl: 1 .  neomycin-polymyxin-hydrocortisone (CORTISPORIN) 3.5-10000-1 OTIC suspension, , Disp: , Rfl: 0 .  ranitidine (ZANTAC) 150 MG tablet, Take 150 mg by mouth daily as needed for heartburn. , Disp: , Rfl:  .  senna-docusate (SENOKOT-S) 8.6-50 MG tablet, Take 1 tablet by mouth 2 (two) times daily., Disp: , Rfl:  .  sulfaSALAzine (AZULFIDINE) 500 MG EC tablet, TAKE 2 TABLETS BY MOUTH TWICE DAILY, Disp: 120 tablet, Rfl: 0 .  tiZANidine (ZANAFLEX) 4 MG tablet, Take 0.5-1 tablets (2-4 mg total) by mouth every 8 (eight) hours as needed for muscle spasms., Disp: 20 tablet, Rfl: 0 .  Vit B6-Vit B12-Omega 3 Acids (VITAMIN B PLUS+ PO), Take 1  tablet by mouth every evening., Disp: , Rfl:   EXAM:  Vitals:   12/11/17 1134  BP: 138/88  Pulse: 77  Temp: 98.4 F (36.9 C)    Body mass index is 38.35 kg/m.  GENERAL: vitals reviewed and listed above, alert, oriented, appears well hydrated and in no acute distress  HEENT: atraumatic, conjunttiva clear, no obvious abnormalities on inspection of external nose and ears  NECK: no obvious masses on inspection  LUNGS: clear to auscultation bilaterally, no wheezes, rales or rhonchi, good air movement  CV: HRRR,bilat LE peripheral edema  MS: moves all extremities without noticeable abnormality, tenderness to palpation left greater than right lumbar paraspinal muscles, normal gait, neurovascular grossly intact distally  PSYCH: pleasant and cooperative, no obvious depression or anxiety  ASSESSMENT AND PLAN:  Discussed the following assessment and plan:  Low back pain, unspecified back pain laterality, unspecified chronicity, with sciatica presence unspecified - Plan: POC Urinalysis Dipstick  Lower extremity edema  Morbid obesity (HCC)  Rheumatoid arthritis, involving unspecified site, unspecified rheumatoid factor presence (HCC)  Erythromelalgia (HCC)  -urine dip clear -advised follow up with her specialist about her back pain, particularly if not improving given her hx - in the interim conservative care per orders/pt instructions -increase lasix to 40mg  -lifestyle recs, including healthy immune/gut supportive diet discussed at length, she has some reported barriers, but I think that a healthy diet and wt reduction could really help her to feel better -she is off gabapentin, start cymbalta for chronic pain -CPE one months -seeing specialists for feet - now will be seeing dermatology -Patient advised to return or notify a doctor immediately if symptoms worsen or persist or new concerns arise.  Patient Instructions  BEFORE YOU LEAVE: -follow up: CANCEL appt coming up next  week. SCHEDULE CPE with Dr. Maudie Mercury in 1 month.  Heat 15 minutes twice daily  Start the cymbalta and take once daily  Tylenol 500-1000mg  up to 3 times per day if needed  Topical menthol (tiger balm) if needed for pain  Tizanidine before bed as needed  Call you back doctor for appointment, particularly if worsening, new symptoms or you are not improving over the next few weeks.  I hope you are feeling better soon! Seek care promptly if your symptoms worsen, new concerns arise or you are not improving with treatment.      Lucretia Kern, DO

## 2017-12-16 ENCOUNTER — Other Ambulatory Visit: Payer: Self-pay | Admitting: Family Medicine

## 2017-12-16 ENCOUNTER — Ambulatory Visit: Payer: 59 | Admitting: Family Medicine

## 2017-12-16 MED FILL — HYDROXYCHLOROQUINE 200 MG T: 200 | 30 days supply | Qty: 30 | Fill #3

## 2017-12-16 MED FILL — LOSARTAN POTASSIUM 100 MG T: 100 | 90 days supply | Qty: 90 | Fill #0

## 2017-12-17 ENCOUNTER — Telehealth: Payer: Self-pay | Admitting: Family Medicine

## 2017-12-17 NOTE — Telephone Encounter (Signed)
I spoke with patient and gave advice from Christine Peng NP, continue to elevate feet, if she has compression stockings can try putting on and do not take Cymbalta in the morning 12/18/217. Patient agreed and I will forward to Dr. Maudie Mercury for review and possible medication change.

## 2017-12-17 NOTE — Telephone Encounter (Signed)
Patient called in to report swelling in both feet below ankles, she has had this happened in the past. She started on Cymbalta 20 mg on 12/11/2017, changed from Gabapentin to new medication. She has tried to elevate both feet and iced, already took dose today of Cymbalta, requesting to go back on Gabapentin. I explained that Dr. Maudie Mercury is out of the office today and I would check with another provider for advice.

## 2017-12-18 NOTE — Telephone Encounter (Signed)
I called the pt and informed her of the message below.  Patient stated she prefers to stop Cymbalta and will restart the Gabapentin tonight as instructed below.

## 2017-12-18 NOTE — Telephone Encounter (Signed)
Swelling and pain in her feet is a chronic issue, not new, and likely not related to the Cymbalta. Elevation and compression are good recommendations for the swelling.  Along with a healthy low sugar and low sodium diet. If she would like to go back to the gabapentin instead of the Cymbalta, that is fine. I believe she is on the lowest dose of cymbalta 20mg , so can not taper. Ok to stop if she wishes.  She did not report feeling better on the gabapentin, but if she thinks if DID help and wishes to restart, can start at 300mg  nightly, then after 1 week go to 300mg  bid and follow up with neurologist. Thanks.

## 2017-12-19 ENCOUNTER — Telehealth: Payer: Self-pay | Admitting: Family Medicine

## 2017-12-19 NOTE — Telephone Encounter (Signed)
Copied from [place] [id number]. Topic: General - Other >> Dec 19, 2017 10:08 AM Lennox Solders wrote: Reason for CRM:pt was seen on 12-11-17 and mention to dr kim she needs another handicap placard form completed. Pt has neuropathy in her feet

## 2017-12-19 NOTE — Telephone Encounter (Signed)
Ok to complete

## 2017-12-22 MED FILL — FUROSEMIDE 20 MG TABS: 20 | 30 days supply | Qty: 45 | Fill #0

## 2017-12-22 NOTE — Telephone Encounter (Signed)
I called the pt and informed her a permanent handicapped placard (same from 03/2017) was completed and mailed to her home address per the pts request.  Copy sent to be scanned.

## 2017-12-31 DIAGNOSIS — M545 Low back pain: Secondary | ICD-10-CM | POA: Diagnosis not present

## 2017-12-31 DIAGNOSIS — Y92009 Unspecified place in unspecified non-institutional (private) residence as the place of occurrence of the external cause: Secondary | ICD-10-CM | POA: Diagnosis not present

## 2017-12-31 DIAGNOSIS — I10 Essential (primary) hypertension: Secondary | ICD-10-CM | POA: Diagnosis not present

## 2017-12-31 DIAGNOSIS — W19XXXA Unspecified fall, initial encounter: Secondary | ICD-10-CM | POA: Diagnosis not present

## 2017-12-31 DIAGNOSIS — Z6837 Body mass index (BMI) 37.0-37.9, adult: Secondary | ICD-10-CM | POA: Diagnosis not present

## 2018-01-01 ENCOUNTER — Encounter: Payer: Self-pay | Admitting: Internal Medicine

## 2018-01-01 ENCOUNTER — Ambulatory Visit: Payer: 59 | Admitting: Internal Medicine

## 2018-01-01 VITALS — BP 150/90 | HR 72 | Ht 66.0 in | Wt 233.0 lb

## 2018-01-01 DIAGNOSIS — M810 Age-related osteoporosis without current pathological fracture: Secondary | ICD-10-CM

## 2018-01-01 DIAGNOSIS — E559 Vitamin D deficiency, unspecified: Secondary | ICD-10-CM

## 2018-01-01 DIAGNOSIS — E213 Hyperparathyroidism, unspecified: Secondary | ICD-10-CM | POA: Diagnosis not present

## 2018-01-01 NOTE — Progress Notes (Addendum)
Patient ID: [name], female   DOB: August 17, 1951, 67 y.o.   MRN: [id number]    HPI  [name] is a 67 y.o.-year-old female, initially referred by her PCP, Dr. Maudie [name], returning for follow-up for hypercalcemia/hyperparathyroidism, osteoporosis, vitamin D deficiency. She is the wife of [name].  I saw the patient for 6 months ago for hypercalcemia and at that time, the PTH was also high, but the vitamin D was very low.  I advised her to start vitamin D supplement and come back in 2 months for a recheck of the vitamin D and further parathyroid labs.  She did not come back for labs in 2 months... She now tells me she stopped the vitamin D soon after we met.   Pt has had hypercalcemia at least since 2014.  I reviewed pertinent labs-PTH was high at last check: Lab Results  Component Value Date   PTH 66 (H) 04/03/2017   CALCIUM 9.2 07/20/2017   CALCIUM 9.6 07/19/2017   CALCIUM 9.5 07/18/2017   CALCIUM 9.5 07/18/2017   CALCIUM 10.0 07/17/2017   CALCIUM 10.5 07/04/2017   CALCIUM 10.4 04/03/2017   CALCIUM 10.4 04/03/2017   CALCIUM 10.0 12/26/2016   CALCIUM 10.7 (H) 10/10/2016   She is on biotin in B complex -taken for peripheral neuropathy and erythromelalgia.  Osteoporosis: Reviewed patient's DXA scan reports: Date L1-L4 T score FN T score  10/23/2011 ([place]) -1.0 LFN: -1.2 RFN: -1.0  10/05/2007 -1.3 (-15.4%*) LFN: -1.4   + Several fractures:  - L wrist fx in 2005 (distal radius and small ulnar styloid avulsion fx). - Right tibial plateau fracture 06/2017 (fell down the stairs) - Right forearm 06/2017 (fell down the stairs) - Left maxillary fracture 06/2017 (fell down the stairs) - Right radial fracture 06/2017 (fell down the stairs)  No h/o kidney stones.  No h/o CKD. Last BUN/Cr: Lab Results  Component Value Date   BUN 21 (H) 07/20/2017   BUN 15 07/19/2017   CREATININE 0.92 07/20/2017   CREATININE 0.92 07/19/2017   She was on HCTZ in the past, but not  recently.  Vitamin D deficiency: - she had a very low level at last check, after which we started 5000 units daily of vitamin D supplement: Lab Results  Component Value Date   VD25OH 14.17 (L) 05/05/2017   VD25OH 51 05/14/2013   Pt does not have a FH of hypercalcemia, pituitary tumors, thyroid cancer, or osteoporosis.   She has FH of thyroid ds in 2 sisters and 1 brother.   Older son and little sister had kidney stones.  Pt. also has a history of RA - on Plaquenyl.  Has HL - on Red Yeast Rice.  ROS: Constitutional: no weight gain/no weight loss, no fatigue, no subjective hyperthermia, no subjective hypothermia Eyes: no blurry vision, no xerophthalmia ENT: no sore throat, no nodules palpated in throat, no dysphagia, no odynophagia, no hoarseness Cardiovascular: no CP/no SOB/no palpitations/no leg swelling Respiratory: no cough/no SOB/no wheezing Gastrointestinal: no N/no V/no D/no C/no acid reflux Musculoskeletal: no muscle aches/no joint aches Skin: no rashes, no hair loss Neurological: no tremors/no numbness/no tingling/no dizziness  I reviewed pt's medications, allergies, PMH, social hx, family hx, and changes were documented in the history of present illness. Otherwise, unchanged from my initial visit note. She increased Gabapentin and Lasix.  Past Medical History:  Diagnosis Date  . Allergy   . Cataract    BILATERAL-REMOVED 2 YEARS AGO  . GERD (gastroesophageal reflux disease)   .  Hx of colonic polyp - ssp 11/03/2014  . Hypercalcemia   . Hypertension   . 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 Osteopenia 10/17/2011   DEXA 09/2007: -1.4 L fem; 10/2011: -1.2 L fem   . PONV (postoperative nausea and vomiting)   . Pseudogout of foot   . Rheumatoid arthritis(714.0) dx 2010   Past Surgical History:  Procedure Laterality Date  . BREAST BIOPSY  1972  . Broken wrist  2010  . CATARACT EXTRACTION  03/2012   left  . COLONOSCOPY    . COSMETIC SURGERY    . FRACTURE SURGERY    .  INNER EAR SURGERY     busted ear drum  . MAXIMUM ACCESS (MAS)POSTERIOR LUMBAR INTERBODY FUSION (PLIF) 1 LEVEL N/A 10/11/2016   Procedure: Lumbar one-Sacral one Maximum access posterior lumbar interbody fusion;  Surgeon: Christine Levine, MD;  Location: [facility];  Service: Neurosurgery;  Laterality: N/A;  . ORIF WRIST FRACTURE Right 07/18/2017   Procedure: OPEN REDUCTION INTERNAL FIXATION (ORIF) WRIST FRACTURE;  Surgeon: Christine Butters, MD;  Location: [facility];  Service: Orthopedics;  Laterality: Right;  . pneumonia  2007   Social History   Social History  . Marital status: Married    Spouse name: N/A  . Number of children: 3  . Years of education: N/A   Occupational History  . Retired    Social History Main Topics  . Smoking status: Former Smoker    Types: Cigarettes  . Smokeless tobacco: Former Systems developer    Quit date: 08/19/1981  . Alcohol use No  . Drug use: No   Social History Narrative   Artist -retired Building control surveyor   Married, lives with spouse, [name], he is IT support for [employer]   3 sons   2 caffeinated beverages a day   No regular exercise, diet is ok   Current Outpatient Medications on File Prior to Visit  Medication Sig Dispense Refill  . aspirin 81 MG tablet Take 81 mg by mouth at bedtime.     . Cholecalciferol (VITAMIN D-3) 5000 units TABS Take 1 tablet by mouth daily.    . furosemide (LASIX) 20 MG tablet Take 1.5 tablets (30 mg total) by mouth daily. 45 tablet 5  . hydroxychloroquine (PLAQUENIL) 200 MG tablet Take 200 mg by mouth daily. Patient is currently taking    . losartan (COZAAR) 100 MG tablet TAKE 1 TABLET BY MOUTH ONCE DAILY 90 tablet 1  . metoprolol succinate (TOPROL-XL) 50 MG 24 hr tablet TAKE 2 TABLETS BY MOUTH DAILY. TAKE WITH OR IMMEDIATELY FOLLOWING A MEAL 180 tablet 1  . neomycin-polymyxin-hydrocortisone (CORTISPORIN) 3.5-10000-1 OTIC suspension   0  . ranitidine (ZANTAC) 150 MG tablet Take 150 mg by mouth daily as needed for heartburn.      . senna-docusate (SENOKOT-S) 8.6-50 MG tablet Take 1 tablet by mouth 2 (two) times daily.    Marland Kitchen sulfaSALAzine (AZULFIDINE) 500 MG EC tablet TAKE 2 TABLETS BY MOUTH TWICE DAILY 120 tablet 0  . tiZANidine (ZANAFLEX) 4 MG tablet Take 0.5-1 tablets (2-4 mg total) by mouth every 8 (eight) hours as needed for muscle spasms. 20 tablet 0  . Vit B6-Vit B12-Omega 3 Acids (VITAMIN B PLUS+ PO) Take 1 tablet by mouth every evening.     No current facility-administered medications on file prior to visit.    Allergies  Allergen Reactions  . Amlodipine Besylate Other (See Comments)    Tremors  . Nortriptyline Other (See Comments)    tremors  .  Dilaudid [Hydromorphone Hcl]     Headache, muscle tightness   Family History  Problem Relation Age of Onset  . Stroke Mother   . Hypertension Father   . Heart attack Father   . Hypertension Sister        x 3  . Hypertension Brother   . Hyperthyroidism Sister        x2, s/p RAI ablation  . Hypothyroidism Brother     PE: BP (!) 150/90 (BP Location: Left Arm, Patient Position: Sitting, Cuff Size: Large)   Pulse 72   Ht _0  (1.676 m)   Wt 233 lb (105.7 kg)   SpO2 94%   BMI 37.61 kg/m  Wt Readings from Last 3 Encounters:  01/01/18 233 lb (105.7 kg)  11/18/17 237 lb 9.6 oz (107.8 kg)  10/17/17 234 lb 2 oz (106.2 kg)   Constitutional: overweight, in NAD Eyes: PERRLA, EOMI, no exophthalmos ENT: moist mucous membranes, no thyromegaly, no cervical lymphadenopathy Cardiovascular: RRR, No MRG Respiratory: CTA B Gastrointestinal: abdomen soft, NT, ND, BS+ Musculoskeletal: no deformities, strength intact in all 4 Skin: moist, warm, no rashes Neurological: no tremor with outstretched hands, DTR normal in all 4  Assessment: 1. Hypercalcemia/hyperparathyroidism  2.  Osteopenia + fragility fractures = osteoporosis  3.  Vitamin D deficiency  Plan: Patient has had several instances of elevated calcium, with the highest level being at 10.8 but this  has normalized after stopping the calcium supplement and stopping HCTZ.  An intact PTH level was high at last visit, at 66, however, a vitamin D level was very low at that time.  We discussed about starting a vitamin D supplement and having her back to repeat her PTH but she did not return for labs - She does not have nephrolithiasis but does have history of fractures and osteopenia on bone density scan.  No abdominal pain, depression, bone scan. - We will plan to check her parathyroid labs off biotin and after vitamin D level normalizes calcium level intact PTH ([facility]) Magnesium Phosphorus vitamin D 1,25 HO 24h urinary calcium/creatinine ratio - given instructions for urine collection - We again discussed about possible consequences of hyperparathyroidism : ~1/3 pts will develop complications over 15 years; osteoporosis, nephrolithiasis - If the tests indicate a parathyroid adenoma, she agrees to a referral for surgery - Criteria for parathyroid surgery Increased calcium by more than 1 mg/dL above the upper limit of normal  Kidney ds.  Osteoporosis (or Vb fx) Age <19 years old New (2013): High UCa >400 mg/d and increased stone risk by biochemical stone risk analysis Presence of nephrolithiasis or nephrocalcinosis Pt's preference!  - I will see the patient back in 6 months  2.  Osteoporosis - She has osteopenia on bone density scan but due to her many fragility fractures, she has a diagnosis of osteoporosis - She had another recent right tibial fracture in 06/2017. - She will need another bone density check and will order this today.  I will include a 33% distal radius.  3.  Vitamin D deficiency - Vitamin D was very low at last visit so we started 5000 units vitamin D daily.  She did not come back for recheck of her level and further parathyroid labs so we will check a level today and have her back for parathyroid lab if the vitamin D level is normal.  She did not have the vitamin D  level drawn...  DXA scan: 01/10/2018 ([facility]) Lumbar spine L1-L3 (L4) Femoral neck (FN) 33%  distal radius  T-score -1.7 RFN: -1.4 LFN: -1.8 Could not be analyzed due to bilateral hardware  Change in BMD from previous DXA test (%) -8.5%* -7.9%* n/a  (*) statistically significant  Bone density scores have decreased significantly.  They are still osteopenic though.  We absolutely need to improve her vitamin D to normal and I plan to repeat another bone density score in a year.  If her vitamin D level is normal at that time and scores are still low, she will need to start antiresorptive therapy.  We will call her back to have her vitamin D drawn.  Philemon Kingdom, MD PhD [facility] Endocrinology

## 2018-01-01 NOTE — Patient Instructions (Addendum)
Please restart vitamin D 5000 units daily and come back for labs in 2 months.  Stop Biotin 1 week before parathyroid labs.  We will schedule your Bone Density Scan.  Please come back for a follow-up appointment in 6 months.

## 2018-01-02 ENCOUNTER — Other Ambulatory Visit: Payer: Self-pay | Admitting: Internal Medicine

## 2018-01-02 DIAGNOSIS — E213 Hyperparathyroidism, unspecified: Secondary | ICD-10-CM

## 2018-01-02 DIAGNOSIS — M810 Age-related osteoporosis without current pathological fracture: Secondary | ICD-10-CM

## 2018-01-02 MED FILL — sulfaSALAzine 500 MG TABS: 500 | 30 days supply | Qty: 120 | Fill #0

## 2018-01-06 DIAGNOSIS — I7381 Erythromelalgia: Secondary | ICD-10-CM | POA: Diagnosis not present

## 2018-01-06 DIAGNOSIS — R5382 Chronic fatigue, unspecified: Secondary | ICD-10-CM | POA: Diagnosis not present

## 2018-01-06 DIAGNOSIS — Z6837 Body mass index (BMI) 37.0-37.9, adult: Secondary | ICD-10-CM | POA: Diagnosis not present

## 2018-01-06 DIAGNOSIS — M0609 Rheumatoid arthritis without rheumatoid factor, multiple sites: Secondary | ICD-10-CM | POA: Diagnosis not present

## 2018-01-06 DIAGNOSIS — E669 Obesity, unspecified: Secondary | ICD-10-CM | POA: Diagnosis not present

## 2018-01-06 DIAGNOSIS — M5441 Lumbago with sciatica, right side: Secondary | ICD-10-CM | POA: Diagnosis not present

## 2018-01-06 DIAGNOSIS — M5136 Other intervertebral disc degeneration, lumbar region: Secondary | ICD-10-CM | POA: Diagnosis not present

## 2018-01-06 DIAGNOSIS — M7989 Other specified soft tissue disorders: Secondary | ICD-10-CM | POA: Diagnosis not present

## 2018-01-06 DIAGNOSIS — M2550 Pain in unspecified joint: Secondary | ICD-10-CM | POA: Diagnosis not present

## 2018-01-06 MED FILL — GABAPENTIN 300 MG CAPSULE: 300 | 90 days supply | Qty: 90 | Fill #2

## 2018-01-08 ENCOUNTER — Ambulatory Visit (INDEPENDENT_AMBULATORY_CARE_PROVIDER_SITE_OTHER)
Admission: RE | Admit: 2018-01-08 | Discharge: 2018-01-08 | Disposition: A | Discharge status: Home / Self Care | Payer: 59 | Source: Ambulatory Visit | Attending: Internal Medicine | Admitting: Internal Medicine

## 2018-01-08 ENCOUNTER — Other Ambulatory Visit: Payer: Self-pay | Admitting: Family Medicine

## 2018-01-08 DIAGNOSIS — M810 Age-related osteoporosis without current pathological fracture: Secondary | ICD-10-CM

## 2018-01-09 MED ORDER — FUROSEMIDE 20 MG PO TABS: 40.0000 mg | ORAL_TABLET | Freq: Every day | ORAL | 1 refills | Status: DC

## 2018-01-09 MED FILL — FUROSEMIDE 20 MG TABS: 20 | 90 days supply | Qty: 180 | Fill #0

## 2018-01-09 NOTE — Addendum Note (Signed)
Addended by: Agnes Lawrence on: 01/09/2018 10:06 AM   Modules accepted: Orders

## 2018-01-09 NOTE — Telephone Encounter (Signed)
Please update rx to what pt is take and send 90 days with 1 refil

## 2018-01-09 NOTE — Telephone Encounter (Signed)
Rx done. 

## 2018-01-12 NOTE — Progress Notes (Signed)
HPI:  Using dictation device. Unfortunately this device frequently misinterprets words/phrases.  Here for CPE: Due for mammogram (ordered last year but she did not do), Colon cancer screening, lipids, hgba1c  -Concerns and/or follow up today:   Chronic medical problems summarized below were reviewed for changes.   HTN/HLD/MOrbid obesity/LE edema: -lasix 4m, losartan, toprol XL  Osteoporosis (osteopenia +fractures)/hypoparathyroidism/Vit D Def: -seeing Dr. GCruzita Lederer Chronic low back pain: -sees Dr. SVertell Limber s/p spinal fusion surgery  RA: -sees rheumatolgist for management (Dr. DEstanislado Pandy -meds: sulfasalazone, plaquenil  Neuropathy/Erythromelagia/LE edema: -chronic LE pain -has seen [place] specialist (neurology-Dr. PPosey Pronto Rheumatology, vascular, podiatry, dermatology) -meds: gabapentin  GERD: -meds: ranitidine  -Diet: variety of foods, balance and well rounded, larger portion sizes -Exercise: no regular exercise -Taking folic acid, vitamin D or calcium: no -Diabetes and Dyslipidemia Screening: fasting -Vaccines: see vaccine section EPIC -pap history: reports all neg and normal in the pasts and declined pelvic -FDLMP: see nursing notes -sexual activity: yes, female partner, no new partners -wants STI testing (Hep C if born [birth year]: no -FH breast, colon or ovarian ca: see FH Last mammogram: past due Last colon cancer screening: sessile serrated polyp - due for 3 year repeat colon cancer Breast Ca Risk Assessment: see family history and pt history DEXA (>/= 647: seeing endocrinologist  -Alcohol, Tobacco, drug use: see social history  Review of Systems - no fevers, unintentional weight loss, vision loss, hearing loss, chest pain, sob, hemoptysis, melena, hematochezia, hematuria, genital discharge, changing or concerning skin lesions, bleeding, bruising, loc, thoughts of self harm or SI  Past Medical History:  Diagnosis Date  . Allergy   . Cataract    BILATERAL-REMOVED 2  YEARS AGO  . GERD (gastroesophageal reflux disease)   . Hx of colonic polyp - ssp 11/03/2014  . Hypercalcemia   . Hypertension   . 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 Osteopenia 10/17/2011   DEXA 09/2007: -1.4 L fem; 10/2011: -1.2 L fem   . PONV (postoperative nausea and vomiting)   . Pseudogout of foot   . Rheumatoid arthritis(714.0) dx 2010    Past Surgical History:  Procedure Laterality Date  . BREAST BIOPSY  1972  . Broken wrist  2010  . CATARACT EXTRACTION  03/2012   left  . COLONOSCOPY    . COSMETIC SURGERY    . FRACTURE SURGERY    . INNER EAR SURGERY     busted ear drum  . MAXIMUM ACCESS (MAS)POSTERIOR LUMBAR INTERBODY FUSION (PLIF) 1 LEVEL N/A 10/11/2016   Procedure: Lumbar one-Sacral one Maximum access posterior lumbar interbody fusion;  Surgeon: JErline Levine MD;  [place] [facility]  Service: Neurosurgery;  Laterality: N/A;  . ORIF WRIST FRACTURE Right 07/18/2017   Procedure: OPEN REDUCTION INTERNAL FIXATION (ORIF) WRIST FRACTURE;  Surgeon: MRenette Butters MD;  [place] [facility]  Service: Orthopedics;  Laterality: Right;  . pneumonia  2007    Family History  Problem Relation Age of Onset  . Stroke Mother   . Hypertension Father   . Heart attack Father   . Hypertension Sister        x 3  . Hypertension Brother   . Hyperthyroidism Sister        x2, s/p RAI ablation  . Hypothyroidism Brother     Social History   Socioeconomic History  . Marital status: Married    Spouse name: Not on file  . Number of children: 3  . Years of education: Not on file  . Highest education level: Not  on file  Occupational History  . Occupation: Retired  Scientific laboratory technician  . Financial resource strain: Not on file  . Food insecurity:    Worry: Not on file    Inability: Not on file  . Transportation needs:    Medical: Not on file    Non-medical: Not on file  Tobacco Use  . Smoking status: Former Smoker    Packs/day: 4.00    Years: 4.00    Pack years: 16.00    Types: Cigarettes     Last attempt to quit: 1985    Years since quitting: 34.4  . Smokeless tobacco: Former Systems developer    Quit date: 08/19/1981  Substance and Sexual Activity  . Alcohol use: No    Alcohol/week: 0.0 oz  . Drug use: No  . Sexual activity: Not on file  Lifestyle  . Physical activity:    Days per week: Not on file    Minutes per session: Not on file  . Stress: Not on file  Relationships  . Social connections:    Talks on phone: Not on file    Gets together: Not on file    Attends religious service: Not on file    Active member of club or organization: Not on file    Attends meetings of clubs or organizations: Not on file    Relationship status: Not on file  Other Topics Concern  . Not on file  Social History Narrative   Artist -retired Building control surveyor   Married, lives with spouse, [name], he is IT support for [employer]   3 sons   2 caffeinated beverages a day   No regular exercise, diet is ok     Current Outpatient Medications:  .  aspirin 81 MG tablet, Take 81 mg by mouth at bedtime. , Disp: , Rfl:  .  Cholecalciferol (VITAMIN D-3) 5000 units TABS, Take 1 tablet by mouth daily., Disp: , Rfl:  .  furosemide (LASIX) 20 MG tablet, Take 2 tablets (40 mg total) by mouth daily., Disp: 180 tablet, Rfl: 1 .  gabapentin (NEURONTIN) 300 MG capsule, Take 300 mg by mouth 2 (two) times daily., Disp: , Rfl:  .  hydroxychloroquine (PLAQUENIL) 200 MG tablet, Take 200 mg by mouth daily. Patient is currently taking, Disp: , Rfl:  .  losartan (COZAAR) 100 MG tablet, TAKE 1 TABLET BY MOUTH ONCE DAILY, Disp: 90 tablet, Rfl: 1 .  metoprolol succinate (TOPROL-XL) 50 MG 24 hr tablet, TAKE 2 TABLETS BY MOUTH DAILY. TAKE WITH OR IMMEDIATELY FOLLOWING A MEAL, Disp: 180 tablet, Rfl: 1 .  neomycin-polymyxin-hydrocortisone (CORTISPORIN) 3.5-10000-1 OTIC suspension, , Disp: , Rfl: 0 .  ranitidine (ZANTAC) 150 MG tablet, Take 150 mg by mouth daily as needed for heartburn. , Disp: , Rfl:  .   senna-docusate (SENOKOT-S) 8.6-50 MG tablet, Take 1 tablet by mouth 2 (two) times daily., Disp: , Rfl:  .  sulfaSALAzine (AZULFIDINE) 500 MG EC tablet, TAKE 2 TABLETS BY MOUTH TWICE DAILY, Disp: 120 tablet, Rfl: 0 .  tiZANidine (ZANAFLEX) 4 MG tablet, Take 0.5-1 tablets (2-4 mg total) by mouth every 8 (eight) hours as needed for muscle spasms., Disp: 20 tablet, Rfl: 0 .  Vit B6-Vit B12-Omega 3 Acids (VITAMIN B PLUS+ PO), Take 1 tablet by mouth every evening., Disp: , Rfl:   EXAM:  Vitals:   01/13/18 1015  BP: 112/80  Pulse: 68  Temp: 98.7 F (37.1 C)  Body mass index is 39.49 kg/m.   GENERAL: vitals  reviewed and listed below, alert, oriented, appears well hydrated and in no acute distress  HEENT: head atraumatic, PERRLA, normal appearance of eyes, ears, nose and mouth. moist mucus membranes.  NECK: supple, no masses or lymphadenopathy  LUNGS: clear to auscultation bilaterally, no rales, rhonchi or wheeze  CV: HRRR, no peripheral edema or cyanosis, normal pedal pulses  ABDOMEN: bowel sounds normal, soft, non tender to palpation, no masses, no rebound or guarding  GU/BREAST: declined  SKIN: no rash or abnormal lesions  MS: normal gait, moves all extremities normally  NEURO: normal gait, speech and thought processing grossly intact, muscle tone grossly intact throughout  PSYCH: normal affect, pleasant and cooperative  ASSESSMENT AND PLAN:  Discussed the following assessment and plan:  PREVENTIVE EXAM: -Discussed and advised all [place] preventive services health task force level A and B recommendations for age, sex and risks. -Advised at least 150 minutes of exercise per week and a healthy diet with avoidance of (less then 1 serving per week) processed foods, white starches, red meat, fast foods and sweets and consisting of: * 5-9 servings of fresh fruits and vegetables (not corn or potatoes) *nuts and seeds, beans *olives and olive oil *lean meats such as fish and white  chicken  *whole grains -labs, studies and vaccines per orders this encounter  2. Screening for depression -see phq9  3. Essential hypertension - Basic metabolic panel  4. Hyperlipidemia, unspecified hyperlipidemia type - Lipid panel  5. Morbid obesity ([place]) - Hemoglobin A1c  6. Rheumatoid arthritis, involving unspecified site, unspecified rheumatoid factor presence ([place]) -sees specialist  7. Erythromelalgia [facility]) -sees neurology  8. Hyperparathyroidism ([place]) -seeing endocrinology  9. Anemia, unspecified type - CBC   Patient advised to return to clinic immediately if symptoms worsen or persist or new concerns.  Patient Instructions  BEFORE YOU LEAVE: -labs -follow up: 3-4 months  CALL TODAY: 1) breast center to schedule your mammogram 2) your gastroenterologist to schedule your colonoscopy  We have ordered labs or studies at this visit. It can take up to 1-2 weeks for results and processing. IF results require follow up or explanation, we will call you with instructions. Clinically stable results will be released to your [facility]. If you have not heard from [place] or cannot find your results in [facility] in 2 weeks please contact our office at [phone].  If you are not yet signed up for [facility], please consider signing up.   Preventive Care 47 Years and Older, Female Preventive care refers to lifestyle choices and visits with your health care provider that can promote health and wellness. What does preventive care include?  A yearly physical exam. This is also called an annual well check.  Dental exams once or twice a year.  Routine eye exams. Ask your health care provider how often you should have your eyes checked.  Personal lifestyle choices, including: ? Daily care of your teeth and gums. ? Regular physical activity. ? Eating a healthy diet. ? Avoiding tobacco and drug use. ? Limiting alcohol use. ? Practicing safe sex. ? Taking low-dose aspirin every  day. ? Taking vitamin and mineral supplements as recommended by your health care provider. What happens during an annual well check? The services and screenings done by your health care provider during your annual well check will depend on your age, overall health, lifestyle risk factors, and family history of disease. Counseling Your health care provider may ask you questions about your:  Alcohol use.  Tobacco use.  Drug use.  Emotional well-being.  Home and relationship well-being.  Sexual activity.  Eating habits.  History of falls.  Memory and ability to understand (cognition).  Work and work Statistician.  Reproductive health.  Screening You may have the following tests or measurements:  Height, weight, and BMI.  Blood pressure.  Lipid and cholesterol levels. These may be checked every 5 years, or more frequently if you are over 35 years old.  Skin check.  Lung cancer screening. You may have this screening every year starting at age 59 if you have a 30-pack-year history of smoking and currently smoke or have quit within the past 15 years.  Fecal occult blood test (FOBT) of the stool. You may have this test every year starting at age 36.  Flexible sigmoidoscopy or colonoscopy. You may have a sigmoidoscopy every 5 years or a colonoscopy every 10 years starting at age 66.  Hepatitis C blood test.  Hepatitis B blood test.  Sexually transmitted disease (STD) testing.  Diabetes screening. This is done by checking your blood sugar (glucose) after you have not eaten for a while (fasting). You may have this done every 1-3 years.  Bone density scan. This is done to screen for osteoporosis. You may have this done starting at age 66.  Mammogram. This may be done every 1-2 years. Talk to your health care provider about how often you should have regular mammograms.  Talk with your health care provider about your test results, treatment options, and if necessary, the  need for more tests. Vaccines Your health care provider may recommend certain vaccines, such as:  Influenza vaccine. This is recommended every year.  Tetanus, diphtheria, and acellular pertussis (Tdap, Td) vaccine. You may need a Td booster every 10 years.  Varicella vaccine. You may need this if you have not been vaccinated.  Zoster vaccine. You may need this after age 22.  Measles, mumps, and rubella (MMR) vaccine. You may need at least one dose of MMR if you were born in 1957 or later. You may also need a second dose.  Pneumococcal 13-valent conjugate (PCV13) vaccine. One dose is recommended after age 60.  Pneumococcal polysaccharide (PPSV23) vaccine. One dose is recommended after age 15.  Meningococcal vaccine. You may need this if you have certain conditions.  Hepatitis A vaccine. You may need this if you have certain conditions or if you travel or work in places where you may be exposed to hepatitis A.  Hepatitis B vaccine. You may need this if you have certain conditions or if you travel or work in places where you may be exposed to hepatitis B.  Haemophilus influenzae type b (Hib) vaccine. You may need this if you have certain conditions.  Talk to your health care provider about which screenings and vaccines you need and how often you need them. This information is not intended to replace advice given to you by your health care provider. Make sure you discuss any questions you have with your health care provider. Document Released: 09/01/2015 Document Revised: 04/24/2016 Document Reviewed: 06/06/2015 Elsevier Interactive Patient Education  2018 [employer].          No follow-ups on file.  Lucretia Kern, DO

## 2018-01-13 ENCOUNTER — Ambulatory Visit (INDEPENDENT_AMBULATORY_CARE_PROVIDER_SITE_OTHER): Payer: 59 | Admitting: Family Medicine

## 2018-01-13 ENCOUNTER — Encounter: Payer: Self-pay | Admitting: Family Medicine

## 2018-01-13 VITALS — BP 112/80 | HR 68 | Temp 98.7°F | Ht 64.5 in | Wt 233.7 lb

## 2018-01-13 DIAGNOSIS — Z1331 Encounter for screening for depression: Secondary | ICD-10-CM | POA: Diagnosis not present

## 2018-01-13 DIAGNOSIS — M069 Rheumatoid arthritis, unspecified: Secondary | ICD-10-CM | POA: Diagnosis not present

## 2018-01-13 DIAGNOSIS — E785 Hyperlipidemia, unspecified: Secondary | ICD-10-CM | POA: Diagnosis not present

## 2018-01-13 DIAGNOSIS — Z299 Encounter for prophylactic measures, unspecified: Secondary | ICD-10-CM

## 2018-01-13 DIAGNOSIS — I7381 Erythromelalgia: Secondary | ICD-10-CM

## 2018-01-13 DIAGNOSIS — D649 Anemia, unspecified: Secondary | ICD-10-CM | POA: Diagnosis not present

## 2018-01-13 DIAGNOSIS — E213 Hyperparathyroidism, unspecified: Secondary | ICD-10-CM

## 2018-01-13 DIAGNOSIS — I10 Essential (primary) hypertension: Secondary | ICD-10-CM

## 2018-01-13 LAB — LIPID PANEL
Cholesterol: 223 mg/dL — ABNORMAL HIGH (ref 0–200)
HDL: 49.9 mg/dL (ref >39.00)
LDL Cholesterol: 145 mg/dL — ABNORMAL HIGH (ref 0–99)
NONHDL: 172.89
TRIGLYCERIDES: 140 mg/dL (ref 0.0–149.0)
Total CHOL/HDL Ratio: 4
VLDL: 28 mg/dL (ref 0.0–40.0)

## 2018-01-13 LAB — HEMOGLOBIN A1C: HEMOGLOBIN A1C: 5.8 % (ref 4.6–6.5)

## 2018-01-13 LAB — CBC
HCT: 37.7 % (ref 36.0–46.0)
HEMOGLOBIN: 12.5 g/dL (ref 12.0–15.0)
MCHC: 33.1 g/dL (ref 30.0–36.0)
MCV: 74.5 fl — AB (ref 78.0–100.0)
PLATELETS: 237 10*3/uL (ref 150.0–400.0)
RBC: 5.06 Mil/uL (ref 3.87–5.11)
RDW: 16.6 % — ABNORMAL HIGH (ref 11.5–15.5)
WBC: 4.8 10*3/uL (ref 4.0–10.5)

## 2018-01-13 LAB — BASIC METABOLIC PANEL
BUN: 13 mg/dL (ref 6–23)
CHLORIDE: 98 meq/L (ref 96–112)
CO2: 31 mEq/L (ref 19–32)
CREATININE: 0.88 mg/dL (ref 0.40–1.20)
Calcium: 10.5 mg/dL (ref 8.4–10.5)
GFR: 67.71 mL/min (ref >60.00)
GLUCOSE: 103 mg/dL — AB (ref 70–99)
POTASSIUM: 4.4 meq/L (ref 3.5–5.1)
Sodium: 136 mEq/L (ref 135–145)

## 2018-01-13 NOTE — Patient Instructions (Addendum)
BEFORE YOU LEAVE: -labs -follow up: 3-4 months  CALL TODAY: 1) breast center to schedule your mammogram 2) your gastroenterologist to schedule your colonoscopy  We have ordered labs or studies at this visit. It can take up to 1-2 weeks for results and processing. IF results require follow up or explanation, we will call you with instructions. Clinically stable results will be released to your [facility]. If you have not heard from [place] or cannot find your results in [facility] in 2 weeks please contact our office at [phone].  If you are not yet signed up for [facility], please consider signing up.   Preventive Care 6 Years and Older, Female Preventive care refers to lifestyle choices and visits with your health care provider that can promote health and wellness. What does preventive care include?  A yearly physical exam. This is also called an annual well check.  Dental exams once or twice a year.  Routine eye exams. Ask your health care provider how often you should have your eyes checked.  Personal lifestyle choices, including: ? Daily care of your teeth and gums. ? Regular physical activity. ? Eating a healthy diet. ? Avoiding tobacco and drug use. ? Limiting alcohol use. ? Practicing safe sex. ? Taking low-dose aspirin every day. ? Taking vitamin and mineral supplements as recommended by your health care provider. What happens during an annual well check? The services and screenings done by your health care provider during your annual well check will depend on your age, overall health, lifestyle risk factors, and family history of disease. Counseling Your health care provider may ask you questions about your:  Alcohol use.  Tobacco use.  Drug use.  Emotional well-being.  Home and relationship well-being.  Sexual activity.  Eating habits.  History of falls.  Memory and ability to understand (cognition).  Work and work Statistician.  Reproductive  health.  Screening You may have the following tests or measurements:  Height, weight, and BMI.  Blood pressure.  Lipid and cholesterol levels. These may be checked every 5 years, or more frequently if you are over 68 years old.  Skin check.  Lung cancer screening. You may have this screening every year starting at age 38 if you have a 30-pack-year history of smoking and currently smoke or have quit within the past 15 years.  Fecal occult blood test (FOBT) of the stool. You may have this test every year starting at age 39.  Flexible sigmoidoscopy or colonoscopy. You may have a sigmoidoscopy every 5 years or a colonoscopy every 10 years starting at age 37.  Hepatitis C blood test.  Hepatitis B blood test.  Sexually transmitted disease (STD) testing.  Diabetes screening. This is done by checking your blood sugar (glucose) after you have not eaten for a while (fasting). You may have this done every 1-3 years.  Bone density scan. This is done to screen for osteoporosis. You may have this done starting at age 62.  Mammogram. This may be done every 1-2 years. Talk to your health care provider about how often you should have regular mammograms.  Talk with your health care provider about your test results, treatment options, and if necessary, the need for more tests. Vaccines Your health care provider may recommend certain vaccines, such as:  Influenza vaccine. This is recommended every year.  Tetanus, diphtheria, and acellular pertussis (Tdap, Td) vaccine. You may need a Td booster every 10 years.  Varicella vaccine. You may need this if you have not been  vaccinated.  Zoster vaccine. You may need this after age 62.  Measles, mumps, and rubella (MMR) vaccine. You may need at least one dose of MMR if you were born in 1957 or later. You may also need a second dose.  Pneumococcal 13-valent conjugate (PCV13) vaccine. One dose is recommended after age 43.  Pneumococcal polysaccharide  (PPSV23) vaccine. One dose is recommended after age 49.  Meningococcal vaccine. You may need this if you have certain conditions.  Hepatitis A vaccine. You may need this if you have certain conditions or if you travel or work in places where you may be exposed to hepatitis A.  Hepatitis B vaccine. You may need this if you have certain conditions or if you travel or work in places where you may be exposed to hepatitis B.  Haemophilus influenzae type b (Hib) vaccine. You may need this if you have certain conditions.  Talk to your health care provider about which screenings and vaccines you need and how often you need them. This information is not intended to replace advice given to you by your health care provider. Make sure you discuss any questions you have with your health care provider. Document Released: 09/01/2015 Document Revised: 04/24/2016 Document Reviewed: 06/06/2015 Elsevier Interactive Patient Education  [employer].

## 2018-01-14 ENCOUNTER — Other Ambulatory Visit: Payer: Self-pay | Admitting: Family Medicine

## 2018-01-14 DIAGNOSIS — Z1231 Encounter for screening mammogram for malignant neoplasm of breast: Secondary | ICD-10-CM

## 2018-01-14 NOTE — Addendum Note (Signed)
Addended by: Agnes Lawrence on: 01/14/2018 07:54 AM   Modules accepted: Orders

## 2018-01-15 MED FILL — HYDROXYCHLOROQUINE 200 MG T: 200 | 30 days supply | Qty: 30 | Fill #0

## 2018-01-29 ENCOUNTER — Other Ambulatory Visit (INDEPENDENT_AMBULATORY_CARE_PROVIDER_SITE_OTHER): Payer: 59

## 2018-01-29 DIAGNOSIS — E785 Hyperlipidemia, unspecified: Secondary | ICD-10-CM | POA: Diagnosis not present

## 2018-01-29 DIAGNOSIS — D649 Anemia, unspecified: Secondary | ICD-10-CM | POA: Diagnosis not present

## 2018-01-29 LAB — LIPID PANEL
Cholesterol: 211 mg/dL — ABNORMAL HIGH (ref 0–200)
HDL: 46.6 mg/dL (ref >39.00)
NonHDL: 164.29
Total CHOL/HDL Ratio: 5
Triglycerides: 212 mg/dL — ABNORMAL HIGH (ref 0.0–149.0)
VLDL: 42.4 mg/dL — ABNORMAL HIGH (ref 0.0–40.0)

## 2018-01-29 LAB — CBC WITH DIFFERENTIAL/PLATELET
Basophils Absolute: 0.1 10*3/uL (ref 0.0–0.1)
Basophils Relative: 0.8 % (ref 0.0–3.0)
EOS PCT: 2.6 % (ref 0.0–5.0)
Eosinophils Absolute: 0.2 10*3/uL (ref 0.0–0.7)
HCT: 38.2 % (ref 36.0–46.0)
Hemoglobin: 12.7 g/dL (ref 12.0–15.0)
LYMPHS ABS: 1.6 10*3/uL (ref 0.7–4.0)
Lymphocytes Relative: 24.1 % (ref 12.0–46.0)
MCHC: 33.1 g/dL (ref 30.0–36.0)
MCV: 75.3 fl — AB (ref 78.0–100.0)
Monocytes Absolute: 0.6 10*3/uL (ref 0.1–1.0)
Monocytes Relative: 9.4 % (ref 3.0–12.0)
NEUTROS ABS: 4.1 10*3/uL (ref 1.4–7.7)
NEUTROS PCT: 63.1 % (ref 43.0–77.0)
PLATELETS: 220 10*3/uL (ref 150.0–400.0)
RBC: 5.07 Mil/uL (ref 3.87–5.11)
RDW: 16.5 % — ABNORMAL HIGH (ref 11.5–15.5)
WBC: 6.5 10*3/uL (ref 4.0–10.5)

## 2018-01-29 LAB — FERRITIN: Ferritin: 7.2 ng/mL — ABNORMAL LOW (ref 10.0–291.0)

## 2018-01-29 LAB — LDL CHOLESTEROL, DIRECT: LDL DIRECT: 133 mg/dL

## 2018-02-02 MED FILL — sulfaSALAzine 500 MG TABS: 500 | 30 days supply | Qty: 120 | Fill #1

## 2018-02-05 ENCOUNTER — Ambulatory Visit
Admission: RE | Admit: 2018-02-05 | Discharge: 2018-02-05 | Disposition: A | Discharge status: Home / Self Care | Payer: 59 | Source: Ambulatory Visit | Attending: Family Medicine | Admitting: Family Medicine

## 2018-02-05 DIAGNOSIS — Z1231 Encounter for screening mammogram for malignant neoplasm of breast: Secondary | ICD-10-CM | POA: Diagnosis not present

## 2018-02-09 ENCOUNTER — Ambulatory Visit: Payer: 59 | Admitting: Neurology

## 2018-02-09 ENCOUNTER — Encounter: Payer: Self-pay | Admitting: Neurology

## 2018-02-09 VITALS — BP 120/74 | HR 64 | Ht 64.5 in | Wt 239.2 lb

## 2018-02-09 DIAGNOSIS — G629 Polyneuropathy, unspecified: Secondary | ICD-10-CM | POA: Diagnosis not present

## 2018-02-09 DIAGNOSIS — B351 Tinea unguium: Secondary | ICD-10-CM

## 2018-02-09 DIAGNOSIS — I7381 Erythromelalgia: Secondary | ICD-10-CM

## 2018-02-09 MED ORDER — GABAPENTIN 300 MG PO CAPS: 300.0000 mg | ORAL_CAPSULE | Freq: Two times a day (BID) | ORAL | 3 refills | Status: DC

## 2018-02-09 NOTE — Patient Instructions (Signed)
Continue gabapentin 300mg  twice daily.  You can add extra 100-200mg  in the afternoon  Follow-up with podiatry for your feet  Return to clinic in 6 months

## 2018-02-09 NOTE — Progress Notes (Signed)
Follow-up Visit   Date: 02/09/18    [name] MRN: [id number] DOB: Sep 06, 1950   Interim History: [name] is a 67 y.o. right-handed Caucasian female with hypertension, GERD, rheumatoid arthritis, erythromelalgia, status post L5-S1 lumbar fusion and decompression returning to the clinic for follow-up of neuropathy.  The patient was accompanied to the clinic by self.   History of present illness: Starting 2013, she developed redness and swelling of the toes and feet, which progressed into burning and firey sensation of the toes.  She also has numbness of the feet. Her toes can swell so much that it prohibits her from moving her toes at times.   She gets marked relief with soaking her feet in ice water.  Activity and heat exacerbates her symptoms.  She has previously seen cardiology who did not feel symptoms were vascular in origin.  She went to [facility] and started her on gabapentin 200mg  twice daily, which provides some relief.   She was unable to tolerate any higher dose due to sedation.   She was diagnosed with rheumatoid arthritis around the same time that her feet started to change color and cause pain.  She takes a daily aspirin, but does not appreciate that this improves her pain any.  She sees Dr. Amil Amen for RA and takes plaquenil and sulfasalazine.   She has long history of low back and bilateral leg pain worse on the right which was exacerbated in 2017 due to a slip. Around that time, she started having right leg shooting pain radiating from her back to her knee. MRI lumbar spine from August 2017 showed narrowing at left L3-4, mild arthritis at L4-5 left>right, severe arthritis at L5-S1 with L5 nerve impingement on the right.  Due to ongoing pain, she underwent decompression and fusion L5-S1 with significant improvement in back and leg pain  on 10/11/2016 by Dr. Vertell Limber and is very pleased with her results.  UPDATE 10/17/2017:   She suffered a traumatic fall in  11/28 and fracture her face, right radius, and right tibia after falling down the stairs as she was trying to bring her laundry down.  She has recovered well from this.  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  She carries ice packs with her and purchased an ice maker, because she depends on keeping her feet cool.  She is taking gabapentin 200mg  in the morning and 300mg  at bedtime, which eases her pain some.  On severe days, she takes an extra 200mg /d which helps.  She started an OTC b-complex vitamin which has helped her neuropathic pain.    UPDATE 02/09/2018:  She is here for follow-up visit and continues to have moderate pain of the feet.  Gabapentin 300mg  BID eases her pain some.  She tried taking 400mg  at bedtime, but developed cognitive side effects. When pain is severe, she adds extra 100mg  in the afternoon.  No interval falls, illnesses, or hospitalizations.  Medications:  Current Outpatient Medications on File Prior to Visit  Medication Sig Dispense Refill  . aspirin 81 MG tablet Take 81 mg by mouth at bedtime.     . Cholecalciferol (VITAMIN D-3) 5000 units TABS Take 1 tablet by mouth daily.    . furosemide (LASIX) 20 MG tablet Take 2 tablets (40 mg total) by mouth daily. 180 tablet 1  . hydroxychloroquine (PLAQUENIL) 200 MG tablet Take 200 mg by mouth daily. Patient is currently taking    . IRON  PO Take by mouth.    . losartan (COZAAR) 100 MG tablet TAKE 1 TABLET BY MOUTH ONCE DAILY 90 tablet 1  . metoprolol succinate (TOPROL-XL) 50 MG 24 hr tablet TAKE 2 TABLETS BY MOUTH DAILY. TAKE WITH OR IMMEDIATELY FOLLOWING A MEAL 180 tablet 1  . neomycin-polymyxin-hydrocortisone (CORTISPORIN) 3.5-10000-1 OTIC suspension   0  . ranitidine (ZANTAC) 150 MG tablet Take 150 mg by mouth daily as needed for heartburn.     . senna-docusate (SENOKOT-S) 8.6-50 MG tablet Take 1 tablet by mouth 2 (two) times daily.    Marland Kitchen sulfaSALAzine (AZULFIDINE) 500 MG EC tablet  TAKE 2 TABLETS BY MOUTH TWICE DAILY 120 tablet 0  . tiZANidine (ZANAFLEX) 4 MG tablet Take 0.5-1 tablets (2-4 mg total) by mouth every 8 (eight) hours as needed for muscle spasms. 20 tablet 0  . Vit B6-Vit B12-Omega 3 Acids (VITAMIN B PLUS+ PO) Take 1 tablet by mouth every evening.     No current facility-administered medications on file prior to visit.     Allergies:  Allergies  Allergen Reactions  . Amlodipine Besylate Other (See Comments)    Tremors  . Nortriptyline Other (See Comments)    tremors  . Dilaudid [Hydromorphone Hcl]     Headache, muscle tightness    Review of Systems:  CONSTITUTIONAL: No fevers, chills, night sweats, or weight loss.  EYES: No visual changes or eye pain ENT: No hearing changes.  No history of nose bleeds.   RESPIRATORY: No cough, wheezing and shortness of breath.   CARDIOVASCULAR: Negative for chest pain, and palpitations.   GI: Negative for abdominal discomfort, blood in stools or black stools.  No recent change in bowel habits.   GU:  No history of incontinence.   MUSCLOSKELETAL: +history of joint pain +swelling.  No myalgias.   SKIN: Negative for lesions, rash, and itching.   ENDOCRINE: Negative for cold or heat intolerance, polydipsia or goiter.   PSYCH:  No depression or anxiety symptoms.   NEURO: As Above.   Vital Signs:  BP 120/74   Pulse 64   Ht 5' 4.5" (1.638 m)   Wt 239 lb 4 oz (108.5 kg)   SpO2 96%   BMI 40.43 kg/m   General Medical Exam:   General:  Well appearing, comfortable  Eyes/ENT: see cranial nerve examination.   Neck: No masses appreciated.  Full range of motion without tenderness.  No carotid bruits. Respiratory:  Clear to auscultation, good air entry bilaterally.   Cardiac:  Regular rate and rhythm, no murmur.   Ext:  Feet and toes are swollen and beet red.  Onychomycosis bilaterally  Neurological Exam: MENTAL STATUS including orientation to time, place, person, recent and remote memory, attention span and  concentration, language, and fund of knowledge is normal.  Speech is not dysarthric.  CRANIAL NERVES:  Face is symmetric.  MOTOR:  Motor strength is 5/5 in all extremities. Tone is normal.    SENSORY:  Vibration is reduced distal to ankles bilaterally.  Temperature is intact.  COORDINATION/GAIT:   Gait narrow based and stable.   Data: MRI lumbar spine 03/19/2016: 1. Severe facet arthrosis at L5-S1 with grade 1 anterolisthesis and moderate right lateral recess stenosis, potentially affecting the right S1 nerve. 2. Mild right and moderate left lateral recess stenosis at L4-5. 3. Moderate left lateral recess stenosis at L3-4.  EKG 10/10/2016:  QTc 442  LAbs 12/18/2016:  TSH 0.89, vitamin B12 405, copper 91, SPEP with IFE no M protein  NCS/EMG of  the legs 01/02/2017:   This study was technically challenged and limited by lower extremity edema.  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  Clinical correlation recommended.  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 Previously tried:  Lyrica (swelling), nortriptyline (tremors), Cymbalta (swelling)   Continue gabapentin 300mg  twice daily.  OK to add extra 100-200mg  in the afternoon, as needed.   She has been on aspirin daily which does not provide any relief with respect to erythromelalgia  Onychomycosis, recommend establishing care with podiatry  Return to clinic 6 months   Thank you for allowing me to participate in patient's care.  If I can answer any additional questions, I would be pleased to do so.    Sincerely,    Donika K. Posey Pronto, DO

## 2018-02-17 ENCOUNTER — Encounter: Payer: Self-pay | Admitting: Physician Assistant

## 2018-02-17 ENCOUNTER — Other Ambulatory Visit: Payer: Self-pay | Admitting: Family Medicine

## 2018-02-17 ENCOUNTER — Ambulatory Visit: Payer: 59 | Admitting: Physician Assistant

## 2018-02-17 VITALS — BP 128/80 | HR 80 | Ht 64.5 in | Wt 236.0 lb

## 2018-02-17 DIAGNOSIS — Z86010 Personal history of colonic polyps: Secondary | ICD-10-CM

## 2018-02-17 DIAGNOSIS — K219 Gastro-esophageal reflux disease without esophagitis: Secondary | ICD-10-CM

## 2018-02-17 DIAGNOSIS — R790 Abnormal level of blood mineral: Secondary | ICD-10-CM | POA: Diagnosis not present

## 2018-02-17 MED FILL — HYDROXYCHLOROQUINE 200 MG T: 200 | 30 days supply | Qty: 30 | Fill #1

## 2018-02-17 MED FILL — METOPROLOL SUCCINATE ER 50: 50 | 90 days supply | Qty: 180 | Fill #0

## 2018-02-17 NOTE — Patient Instructions (Signed)

## 2018-02-17 NOTE — Progress Notes (Addendum)
Chief Complaint: Iron deficiency  HPI:    [name] is a 67 year old female, known to Dr. Carlean Purl, with past medical history as listed below, who was referred to me by Lucretia Kern, DO for a complaint of iron deficiency.      10/28/2014 colonoscopy for screening with a sessile polyp in the transverse colon, melanosis coli throughout the entire colon and otherwise normal.  Pathology shows sessile serrated polyp.  Repeat was recommended in 3 years.    CBC 01/29/2018 shows a normal hemoglobin of 12.7, MCV low at 75.3.  Ferritin found to be low at 7.2.  Patient started on iron twice daily.    Today, claims she was recently told that she was low on iron.  She has been taking iron once daily over the past 2 to 3 weeks and has recheck of her labs in a week or so.  Does describe a decrease in energy in general but does tell me that she has been battling with "neuropathy" and in general has not been able to do as much as she was before which she feels is contributing to her general fatigue.    Chronic bright red blood with bowel movements after she had her first child over 30+ years ago, no change in amount or frequency.  Has always blamed this on hemorrhoids.    Does tell me bowel movements have gotten some better, was on chronic laxatives and is now off of them with usually a normal bowel movement every day. Uses laxative prn now.    Chronic reflux, use Zantac as needed, this is worse with certain foods.    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    Denies fever, chills, melena, weight loss, change in bowel habits, abdominal pain or symptoms that awaken her at night.  Past Medical History:  Diagnosis Date  . Allergy   . Cataract    BILATERAL-REMOVED 2 YEARS AGO  . GERD (gastroesophageal reflux disease)   . Hx of colonic polyp - ssp 11/03/2014  . Hypercalcemia   . Hypertension   . 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 Osteopenia 10/17/2011   DEXA 09/2007: -1.4 L fem; 10/2011:  -1.2 L fem   . PONV (postoperative nausea and vomiting)   . Pseudogout of foot   . Rheumatoid arthritis(714.0) dx 2010    Past Surgical History:  Procedure Laterality Date  . BREAST BIOPSY  1972  . Broken wrist  2010  . CATARACT EXTRACTION  03/2012   left  . COLONOSCOPY    . COSMETIC SURGERY    . FRACTURE SURGERY    . INNER EAR SURGERY     busted ear drum  . MAXIMUM ACCESS (MAS)POSTERIOR LUMBAR INTERBODY FUSION (PLIF) 1 LEVEL N/A 10/11/2016   Procedure: Lumbar one-Sacral one Maximum access posterior lumbar interbody fusion;  Surgeon: Erline Levine, MD;  Location: [facility];  Service: Neurosurgery;  Laterality: N/A;  . ORIF WRIST FRACTURE Right 07/18/2017   Procedure: OPEN REDUCTION INTERNAL FIXATION (ORIF) WRIST FRACTURE;  Surgeon: Renette Butters, MD;  Location: [facility];  Service: Orthopedics;  Laterality: Right;  . pneumonia  2007    Current Outpatient Medications  Medication Sig Dispense Refill  . aspirin 81 MG tablet Take 81 mg by mouth at bedtime.     . Cholecalciferol (VITAMIN D-3) 5000 units TABS Take 1 tablet by mouth daily.    . furosemide (LASIX) 20 MG tablet Take 2 tablets (40 mg total) by mouth daily.  180 tablet 1  . gabapentin (NEURONTIN) 300 MG capsule Take 1 capsule (300 mg total) by mouth 2 (two) times daily. 180 capsule 3  . hydroxychloroquine (PLAQUENIL) 200 MG tablet Take 200 mg by mouth daily. Patient is currently taking    . IRON PO Take by mouth.    . losartan (COZAAR) 100 MG tablet TAKE 1 TABLET BY MOUTH ONCE DAILY 90 tablet 1  . metoprolol succinate (TOPROL-XL) 50 MG 24 hr tablet TAKE 2 TABLETS BY MOUTH DAILY. TAKE WITH OR IMMEDIATELY FOLLOWING A MEAL 180 tablet 1  . neomycin-polymyxin-hydrocortisone (CORTISPORIN) 3.5-10000-1 OTIC suspension   0  . ranitidine (ZANTAC) 150 MG tablet Take 150 mg by mouth daily as needed for heartburn.     . senna-docusate (SENOKOT-S) 8.6-50 MG tablet Take 1 tablet by mouth 2 (two) times daily.    Marland Kitchen sulfaSALAzine (AZULFIDINE) 500 MG  EC tablet TAKE 2 TABLETS BY MOUTH TWICE DAILY 120 tablet 0  . tiZANidine (ZANAFLEX) 4 MG tablet Take 0.5-1 tablets (2-4 mg total) by mouth every 8 (eight) hours as needed for muscle spasms. 20 tablet 0  . Vit B6-Vit B12-Omega 3 Acids (VITAMIN B PLUS+ PO) Take 1 tablet by mouth every evening.     No current facility-administered medications for this visit.     Allergies as of 02/17/2018 - Review Complete 02/09/2018  Allergen Reaction Noted  . Amlodipine besylate Other (See Comments) 12/18/2016  . Nortriptyline Other (See Comments) 05/05/2017  . Dilaudid [hydromorphone hcl]  07/17/2017    Family History  Problem Relation Age of Onset  . Stroke Mother   . Hypertension Father   . Heart attack Father   . Hypertension Sister        x 3  . Hypertension Brother   . Hyperthyroidism Sister        x2, s/p RAI ablation  . Hypothyroidism Brother     Social History   Socioeconomic History  . Marital status: Married    Spouse name: Not on file  . Number of children: 3  . Years of education: Not on file  . Highest education level: Not on file  Occupational History  . Occupation: Retired  Scientific laboratory technician  . Financial resource strain: Not on file  . Food insecurity:    Worry: Not on file    Inability: Not on file  . Transportation needs:    Medical: Not on file    Non-medical: Not on file  Tobacco Use  . Smoking status: Former Smoker    Packs/day: 4.00    Years: 4.00    Pack years: 16.00    Types: Cigarettes    Last attempt to quit: 1985    Years since quitting: 34.5  . Smokeless tobacco: Former Systems developer    Quit date: 08/19/1981  Substance and Sexual Activity  . Alcohol use: No    Alcohol/week: 0.0 oz  . Drug use: No  . Sexual activity: Not on file  Lifestyle  . Physical activity:    Days per week: Not on file    Minutes per session: Not on file  . Stress: Not on file  Relationships  . Social connections:    Talks on phone: Not on file    Gets together: Not on file     Attends religious service: Not on file    Active member of club or organization: Not on file    Attends meetings of clubs or organizations: Not on file    Relationship status: Not on file  .  Intimate partner violence:    Fear of current or ex partner: Not on file    Emotionally abused: Not on file    Physically abused: Not on file    Forced sexual activity: Not on file  Other Topics Concern  . Not on file  Social History Narrative   Artist -retired Building control surveyor   Married, lives with spouse, [name], he is IT support for [employer]   3 sons   2 caffeinated beverages a day   No regular exercise, diet is ok    Review of Systems:    Constitutional: No weight loss, fever or chills Skin: No rash  Cardiovascular: No chest pain  Respiratory: No SOB Gastrointestinal: See HPI and otherwise negative Genitourinary: No dysuria  Neurological: No headache, dizziness or syncope Musculoskeletal: No new muscle or joint pain Hematologic: No bruising Psychiatric: No history of depression or anxiety   Physical Exam:  Vital signs: BP 128/80   Pulse 80   Ht 5' 4.5" (1.638 m)   Wt 236 lb (107 kg)   BMI 39.88 kg/m   Constitutional:   Pleasant overweight Caucasian female appears to be in NAD, Well developed, Well nourished, alert and cooperative Head:  Normocephalic and atraumatic. Eyes:   PEERL, EOMI. No icterus. Conjunctiva pink. Ears:  Normal auditory acuity. Neck:  Supple Throat: Oral cavity and pharynx without inflammation, swelling or lesion.  Respiratory: Respirations even and unlabored. Lungs clear to auscultation bilaterally.   No wheezes, crackles, or rhonchi.  Cardiovascular: Normal S1, S2. No MRG. Regular rate and rhythm. No peripheral edema, cyanosis or pallor.  Gastrointestinal:  Soft, nondistended, nontender. No rebound or guarding. Normal bowel sounds. No appreciable masses or hepatomegaly. Rectal:  Not performed.  Msk:  Symmetrical without gross deformities.  Without edema, no deformity or joint abnormality.  Neurologic:  Alert and  oriented x4;  grossly normal neurologically.  Skin:   Dry and intact without significant lesions or rashes. Psychiatric: Demonstrates good judgement and reason without abnormal affect or behaviors.  MOST RECENT LABS AND IMAGING: CBC    Component Value Date/Time   WBC 6.5 01/29/2018 1247   RBC 5.07 01/29/2018 1247   HGB 12.7 01/29/2018 1247   HCT 38.2 01/29/2018 1247   PLT 220.0 01/29/2018 1247   MCV 75.3 (L) 01/29/2018 1247   MCH 23.7 (L) 07/21/2017 0510   MCHC 33.1 01/29/2018 1247   RDW 16.5 (H) 01/29/2018 1247   LYMPHSABS 1.6 01/29/2018 1247   MONOABS 0.6 01/29/2018 1247   EOSABS 0.2 01/29/2018 1247   BASOSABS 0.1 01/29/2018 1247    CMP     Component Value Date/Time   NA 136 01/13/2018 1050   NA 138 04/21/2015   K 4.4 01/13/2018 1050   CL 98 01/13/2018 1050   CO2 31 01/13/2018 1050   GLUCOSE 103 (H) 01/13/2018 1050   BUN 13 01/13/2018 1050   BUN 13 04/21/2015   CREATININE 0.88 01/13/2018 1050   CREATININE 0.95 06/27/2016 1414   CALCIUM 10.5 01/13/2018 1050   PROT 6.0 (L) 07/18/2017 1245   ALBUMIN 3.2 (L) 07/18/2017 1245   AST 17 07/18/2017 1245   ALT 20 07/18/2017 1245   ALKPHOS 88 07/18/2017 1245   BILITOT 0.6 07/18/2017 1245   GFRNONAA >60 07/20/2017 0529   GFRNONAA 62 06/27/2016 1414   GFRAA >60 07/20/2017 0529   GFRAA 72 06/27/2016 1414    Assessment: 1.  Low ferritin: Indicating mild microcytic anemia, hemoglobin normal, chronic brbpr for 30+ yrs-blames on hemorrhoids,  on iron qd over past 2-3 weeks, asymptomatic 2.  History of polyps: Last colonoscopy in 2016 with recommendation for repeat in 3 years 3. Chronic GERD: controlled on prn Zantac  Plan: 1.  Scheduled patient for EGD and Colonoscopy in the [place] with Dr. Carlean Purl.  Did discuss risk, benefits, limitations and alternatives and patient agrees to proceed. 2.  Recommend patient continue iron qd 3. Continue to follow with pcp  for repeat labs 4.  Patient to follow in clinic per recommendations from Dr. Carlean Purl after time of procedures.  Ellouise Newer, PA-C [facility] 02/17/2018, 10:59 AM  Cc: Lucretia Kern, DO   Agree with Ms. Carsen Leaf's evaluation and management.  Gatha Mayer, MD, [facility]

## 2018-02-23 ENCOUNTER — Telehealth: Payer: Self-pay

## 2018-02-23 ENCOUNTER — Encounter: Payer: Self-pay | Admitting: Neurology

## 2018-02-23 NOTE — Telephone Encounter (Signed)
Received a mychart message from patient requesting the use of a pain patch for her foot.  Tried to reply to [employer] but was unable to, also tried calling cell and was not able to leave a message.  Did leave message on home phone.

## 2018-02-24 MED FILL — GABAPENTIN 300 MG CAPSULE: 300 | 90 days supply | Qty: 180 | Fill #0

## 2018-03-04 ENCOUNTER — Other Ambulatory Visit (INDEPENDENT_AMBULATORY_CARE_PROVIDER_SITE_OTHER): Payer: 59

## 2018-03-04 DIAGNOSIS — D649 Anemia, unspecified: Secondary | ICD-10-CM | POA: Diagnosis not present

## 2018-03-04 DIAGNOSIS — E559 Vitamin D deficiency, unspecified: Secondary | ICD-10-CM | POA: Diagnosis not present

## 2018-03-04 LAB — CBC WITH DIFFERENTIAL/PLATELET
BASOS ABS: 0 10*3/uL (ref 0.0–0.1)
BASOS PCT: 0.7 % (ref 0.0–3.0)
EOS PCT: 3.3 % (ref 0.0–5.0)
Eosinophils Absolute: 0.1 10*3/uL (ref 0.0–0.7)
HEMATOCRIT: 42.2 % (ref 36.0–46.0)
Hemoglobin: 14.2 g/dL (ref 12.0–15.0)
Lymphocytes Relative: 36.4 % (ref 12.0–46.0)
Lymphs Abs: 1.6 10*3/uL (ref 0.7–4.0)
MCHC: 33.6 g/dL (ref 30.0–36.0)
MCV: 79.3 fl (ref 78.0–100.0)
MONOS PCT: 8.7 % (ref 3.0–12.0)
Monocytes Absolute: 0.4 10*3/uL (ref 0.1–1.0)
NEUTROS ABS: 2.3 10*3/uL (ref 1.4–7.7)
Neutrophils Relative %: 50.9 % (ref 43.0–77.0)
PLATELETS: 215 10*3/uL (ref 150.0–400.0)
RBC: 5.32 Mil/uL — ABNORMAL HIGH (ref 3.87–5.11)
RDW: 18.3 % — AB (ref 11.5–15.5)
WBC: 4.5 10*3/uL (ref 4.0–10.5)

## 2018-03-04 LAB — VITAMIN D 25 HYDROXY (VIT D DEFICIENCY, FRACTURES): VITD: 42.24 ng/mL (ref 30.00–100.00)

## 2018-03-04 MED FILL — sulfaSALAzine 500 MG TABS: 500 | 30 days supply | Qty: 120 | Fill #2

## 2018-03-05 ENCOUNTER — Other Ambulatory Visit: Payer: 59

## 2018-03-17 MED FILL — LOSARTAN POTASSIUM 100 MG T: 100 | 90 days supply | Qty: 90 | Fill #1

## 2018-03-17 MED FILL — HYDROXYCHLOROQUINE 200 MG T: 200 | 30 days supply | Qty: 30 | Fill #2

## 2018-03-26 ENCOUNTER — Encounter: Payer: 59 | Admitting: Internal Medicine

## 2018-04-03 ENCOUNTER — Ambulatory Visit: Payer: 59 | Admitting: Podiatry

## 2018-04-03 ENCOUNTER — Encounter: Payer: Self-pay | Admitting: Podiatry

## 2018-04-03 DIAGNOSIS — L988 Other specified disorders of the skin and subcutaneous tissue: Secondary | ICD-10-CM | POA: Diagnosis not present

## 2018-04-03 DIAGNOSIS — I7381 Erythromelalgia: Secondary | ICD-10-CM

## 2018-04-03 MED ORDER — CASTELLANI PAINT MODIFIED 1.5 % EX LIQD
CUTANEOUS | 2 refills | Status: DC
Start: 2018-04-03 — End: 2019-04-12

## 2018-04-06 ENCOUNTER — Telehealth: Payer: Self-pay

## 2018-04-06 ENCOUNTER — Encounter: Payer: Self-pay | Admitting: Podiatry

## 2018-04-06 DIAGNOSIS — M79672 Pain in left foot: Principal | ICD-10-CM

## 2018-04-06 DIAGNOSIS — M79671 Pain in right foot: Secondary | ICD-10-CM

## 2018-04-06 MED FILL — sulfaSALAzine 500 MG TABS: 500 | 30 days supply | Qty: 120 | Fill #0

## 2018-04-06 NOTE — Telephone Encounter (Signed)
I called the pt and informed her of the message below.  She is aware the referral was placed and someone will call with appt info. 

## 2018-04-06 NOTE — Progress Notes (Signed)
Subjective: [name] presents today with cc of chronic pain, longstanding. She has been diagnosed with erythromelalgia and cannot seem to get any relief. She states her feet are hot all the time and she soaks them in ice water which is the only thing that gives her any relief.   One thing she does remember is when she is hospitalized and given IV antibiotics, her pain and swelling resolves.   She is followed by rheumatology, vascular and neurology.  Objective: Vascular Examination: Capillary refill time <3 seconds x 10 digits Dorsalis pedis and posterior tibial pulses present b/l No digital hair x 10 digits Skin temperature cold feet b/l  Dermatological Examination: Skin noted to be erythematous and cold to touch. Erythema blanches with palpation. No blisters noted. Interdigital maceration noted b/l webspaces 1-4. No breaks in skin. No signs of deep space abscess noted.   Musculoskeletal: Muscle strength 5/5 to all LE muscle groups  Neurological: Sensation diminished b/l  Assessment: Erythromelalgia b/l LE Maceration webspaces 1-4 b/l   Plan: 1. Discussed diagnoses with patient. Unfortunately, this is a chronic condition which occurs periodically. We discussed her getting an infectious disease consultation since she did relate history of resolution on IV antibiotics. 2. For interdigital maceration, recommended Castellani's paint to avoid skin breakdown from foot soaks. Rx written for her to paint between toes after foot soaks. 3. Patient to continue soft, supportive shoe gear 4. Patient to report any pedal injuries to medical professional immediately. 5. Follow up after ID consultation. 6.  Patient/POA to call should there be a concern in the interim.

## 2018-04-06 NOTE — Telephone Encounter (Signed)
Copied from [place] [id number]. Topic: Referral - Request >> Apr 06, 2018 12:39 PM [name] wrote: Reason for CRM: Patient would like referral to infectious disease for her foot inssue Dr. Maudie Mercury is aware of. Wants it to be [facility] or within [facility]. Please call patient once placed.

## 2018-04-06 NOTE — Telephone Encounter (Signed)
I actually don't recall her discussing this with me. It looks like she discussed it with her podiatrist. I am not sure how infectious disease could help, but ok to place referral, I am just not sure if they will take the referral. But, can try. Ok to place.

## 2018-04-15 MED FILL — FUROSEMIDE 20 MG TABS: 20 | 90 days supply | Qty: 180 | Fill #0

## 2018-04-16 ENCOUNTER — Encounter: Payer: Self-pay | Admitting: Internal Medicine

## 2018-04-21 MED FILL — HYDROXYCHLOROQUINE SULFATE: 200 | 30 days supply | Qty: 30 | Fill #0

## 2018-04-28 ENCOUNTER — Encounter: Payer: 59 | Admitting: Internal Medicine

## 2018-05-04 ENCOUNTER — Other Ambulatory Visit: Payer: 59

## 2018-05-05 MED FILL — sulfaSALAzine 500 MG TABS: 500 | 30 days supply | Qty: 120 | Fill #1

## 2018-05-12 ENCOUNTER — Encounter: Payer: Self-pay | Admitting: Family Medicine

## 2018-05-12 DIAGNOSIS — D509 Iron deficiency anemia, unspecified: Secondary | ICD-10-CM | POA: Insufficient documentation

## 2018-05-12 DIAGNOSIS — R739 Hyperglycemia, unspecified: Secondary | ICD-10-CM | POA: Insufficient documentation

## 2018-05-12 DIAGNOSIS — R600 Localized edema: Secondary | ICD-10-CM | POA: Insufficient documentation

## 2018-05-12 NOTE — Progress Notes (Signed)
HPI:  Using dictation device. Unfortunately this device frequently misinterprets words/phrases.  [name] is a pleasant 67 y.o. here for follow up. Chronic medical problems summarized below were reviewed for changes and stability and were updated as needed below. These issues and their treatment remain stable for the most part. Denies CP, SOB, DOE, treatment intolerance or new symptoms. New complaint of L ear pain. Started in the last 1 week. Symptoms include mild URI with pnd, nasal congestion, cough and L ear pain. She reports hx recurrent cerumen impaction, recurrent ear infections and recurrent TM perforations. See ENT. Due for colon cancer screening (advised a number of times), reports she scheduled her colonscopy but then her husband could not get off work to take her so she canceled it. Agrees to call to schedule.  flu vaccine, labs(hemoglobin A1c, lipids, BMP) - does not want to get any of these done today because of the ear issues. Agrees to follow up on this.  HTN/HLD/MOrbid obesity/hyperglycemia/LE edema: -lasix 40mg , losartan, toprol XL -Recommended a comprehensive lifestyle changes and weight management clinic  Anemia with low iron: -History: Polyps, history of GERD, history of hemorrhoids with chronic brbpr -Recommended GI evaluation, she was to get EGD and colonoscopy per GI notes from July 2019   Osteoporosis (osteopenia +fractures)/hypoparathyroidism/Vit D Def: -seeing Dr. Cruzita Lederer  Chronic low back pain: -sees Dr. Vertell Limber, s/p spinal fusion surgery  RA: -sees rheumatolgist for management (Dr. Estanislado Pandy) -meds: sulfasalazone, plaquenil  Neuropathy/Erythromelagia/LE edema: -chronic LE pain -has seen [place] specialist (neurology-Dr. Posey Pronto, Rheumatology, vascular, podiatry, dermatology) -meds: gabapentin  GERD: -meds: ranitidine    ROS: See pertinent positives and negatives per HPI.  Past Medical History:  Diagnosis Date  . Allergy   . Cataract     BILATERAL-REMOVED 2 YEARS AGO  . GERD (gastroesophageal reflux disease)   . Hx of colonic polyp - ssp 11/03/2014  . Hypercalcemia   . Hypertension   . Left maxillary fracture ([place]) 07/17/2017  . 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 Osteopenia 10/17/2011   DEXA 09/2007: -1.4 L fem; 10/2011: -1.2 L fem   . PONV (postoperative nausea and vomiting)   . Pseudogout of foot   . Rheumatoid arthritis(714.0) dx 2010  . Tibial plateau fracture, right, closed, initial encounter 07/16/2017    Past Surgical History:  Procedure Laterality Date  . BREAST BIOPSY  1972  . Broken wrist  2010  . CATARACT EXTRACTION  03/2012   left  . COLONOSCOPY    . COSMETIC SURGERY    . FRACTURE SURGERY    . INNER EAR SURGERY     busted ear drum  . MAXIMUM ACCESS (MAS)POSTERIOR LUMBAR INTERBODY FUSION (PLIF) 1 LEVEL N/A 10/11/2016   Procedure: Lumbar one-Sacral one Maximum access posterior lumbar interbody fusion;  Surgeon: Erline Levine, MD;  Location: [place];  Service: Neurosurgery;  Laterality: N/A;  . ORIF WRIST FRACTURE Right 07/18/2017   Procedure: OPEN REDUCTION INTERNAL FIXATION (ORIF) WRIST FRACTURE;  Surgeon: Renette Butters, MD;  Location: [place];  Service: Orthopedics;  Laterality: Right;  . pneumonia  2007    Family History  Problem Relation Age of Onset  . Stroke Mother   . Hypertension Father   . Heart attack Father   . Hypertension Sister        x 3  . Hypertension Brother   . Hyperthyroidism Sister        x2, s/p RAI ablation  . Hypothyroidism Brother     SOCIAL HX: see hpi   Current  Outpatient Medications:  .  aspirin 81 MG tablet, Take 81 mg by mouth at bedtime. , Disp: , Rfl:  .  Castellani Paint Modified 1.5 % LIQD, Paint between toes after foot soaks up to 3 times per day., Disp: 1 Bottle, Rfl: 2 .  Cholecalciferol (VITAMIN D-3) 5000 units TABS, Take 1 tablet by mouth daily., Disp: , Rfl:  .  furosemide (LASIX) 20 MG tablet, Take 2 tablets (40 mg total) by mouth daily., Disp: 180  tablet, Rfl: 1 .  gabapentin (NEURONTIN) 300 MG capsule, Take 1 capsule (300 mg total) by mouth 2 (two) times daily., Disp: 180 capsule, Rfl: 3 .  hydroxychloroquine (PLAQUENIL) 200 MG tablet, Take 200 mg by mouth daily. Patient is currently taking, Disp: , Rfl:  .  IRON PO, Take by mouth., Disp: , Rfl:  .  losartan (COZAAR) 100 MG tablet, TAKE 1 TABLET BY MOUTH ONCE DAILY, Disp: 90 tablet, Rfl: 1 .  metoprolol succinate (TOPROL-XL) 50 MG 24 hr tablet, TAKE 2 TABLETS BY MOUTH DAILY. TAKE WITH OR IMMEDIATELY FOLLOWING A MEAL, Disp: 180 tablet, Rfl: 1 .  neomycin-polymyxin-hydrocortisone (CORTISPORIN) 3.5-10000-1 OTIC suspension, , Disp: , Rfl: 0 .  ranitidine (ZANTAC) 150 MG tablet, Take 150 mg by mouth daily as needed for heartburn. , Disp: , Rfl:  .  senna-docusate (SENOKOT-S) 8.6-50 MG tablet, Take 1 tablet by mouth 2 (two) times daily., Disp: , Rfl:  .  sulfaSALAzine (AZULFIDINE) 500 MG EC tablet, TAKE 2 TABLETS BY MOUTH TWICE DAILY, Disp: 120 tablet, Rfl: 0 .  tiZANidine (ZANAFLEX) 4 MG tablet, Take 0.5-1 tablets (2-4 mg total) by mouth every 8 (eight) hours as needed for muscle spasms., Disp: 20 tablet, Rfl: 0 .  Vit B6-Vit B12-Omega 3 Acids (VITAMIN B PLUS+ PO), Take 1 tablet by mouth every evening., Disp: , Rfl:   EXAM:  Vitals:   05/14/18 0944  BP: 118/76  Pulse: 72  Temp: 98.5 F (36.9 C)    Body mass index is 41.03 kg/m.  GENERAL: vitals reviewed and listed above, alert, oriented, appears well hydrated and in no acute distress  HEENT: atraumatic, conjunttiva clear, no obvious abnormalities on inspection of external nose and ears, normal appearance of ear canals and TMs except for small amount of soft wax - gently remove from distal L ear canal to visualize TM and there is a small area of what looks like dried blood bhind the post portion of the L TM, there is scarring of the TM, no bulging, purulent effusion or difuse erythema of the TM, clear nasal congestion, mild post  oropharyngeal erythema with PND, no tonsillar edema or exudate, no sinus TTP  NECK: no obvious masses on inspection  LUNGS: clear to auscultation bilaterally, no wheezes, rales or rhonchi, good air movement  CV: HRRR, no peripheral edema  MS: moves all extremities without noticeable abnormality  PSYCH: pleasant and cooperative, no obvious depression or anxiety  ASSESSMENT AND PLAN:  Discussed the following assessment and plan:  Left ear pain -advised ENT evaluation promptly - she prefers to call her ENT office and agrees to do so this morning  Essential hypertension -advised labs, she declined today, agrees to return for labs -cont current tx  Hyperglycemia -declined labs today -agrees to return for labs -cont current tx and have advised many times a healthy low sugar diet, regular exercise  Lower extremity edema -chronic -sees a number of specialist  Hyperlipidemia, unspecified hyperlipidemia type Morbid obesity ([place]) -advised labs -have advised wt reduction, healthy lifestyle  Iron deficiency anemia, unspecified iron deficiency anemia type -labs done recently, hgb normal  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 Cruzita Lederer for management  Rheumatoid arthritis, involving unspecified site, unspecified rheumatoid factor presence ([place]) -sees specialist for management  -Patient advised to return or notify a doctor immediately if symptoms worsen or persist or new concerns arise.  Patient Instructions  BEFORE YOU LEAVE: -follow up:  1) vaccine visit with Wendie Simmer for flu shot and lab visit for fasting labs in 1-2 weeks 2)follow up 3-4 months with Dr. Maudie Mercury  Call your ear, nose and throat doctor today.  Please call to reschedule your colonoscopy.    We have ordered labs or studies at this visit. It can take up to 1-2 weeks for results and processing. IF results require follow up or explanation, we will call you with  instructions. Clinically stable results will be released to your [facility]. If you have not heard from [place] or cannot find your results in [facility] in 2 weeks please contact our office at [phone].  If you are not yet signed up for [facility], please consider signing up.   We recommend the following healthy lifestyle for LIFE: 1) Small portions. But, make sure to get regular (at least 3 per day), healthy meals and small healthy snacks if needed.  2) Eat a healthy clean diet.   TRY TO EAT: -at least 5-7 servings of low sugar, colorful, and nutrient rich vegetables per day (not corn, potatoes or bananas.) -berries are the best choice if you wish to eat fruit (only eat small amounts if trying to reduce weight)  -lean meets (fish, white meat of chicken or [place]) -vegan proteins for some meals - beans or tofu, whole grains, nuts and seeds -Replace bad fats with good fats - good fats include: fish, nuts and seeds, canola oil, olive oil -small amounts of low fat or non fat dairy -small amounts of100 % whole grains - check the lables -drink plenty of water  AVOID: -SUGAR, sweets, anything with added sugar, corn syrup or sweeteners - must read labels as even foods advertised as "healthy" often are loaded with sugar -if you must have a sweetener, small amounts of stevia may be best -sweetened beverages and artificially sweetened beverages -simple starches (rice, bread, potatoes, pasta, chips, etc - small amounts of 100% whole grains are ok) -red meat, pork, butter -fried foods, fast food, processed food, excessive dairy, eggs and coconut.  3)Get at least 150 minutes of sweaty aerobic exercise per week.  4)Reduce stress - consider counseling, meditation and relaxation to balance other aspects of your life.          Lucretia Kern, DO

## 2018-05-14 ENCOUNTER — Ambulatory Visit (INDEPENDENT_AMBULATORY_CARE_PROVIDER_SITE_OTHER): Payer: 59 | Admitting: Family Medicine

## 2018-05-14 ENCOUNTER — Ambulatory Visit: Payer: 59 | Admitting: Family Medicine

## 2018-05-14 ENCOUNTER — Encounter: Payer: Self-pay | Admitting: Family Medicine

## 2018-05-14 VITALS — BP 118/76 | HR 72 | Temp 98.5°F | Ht 64.5 in | Wt 242.8 lb

## 2018-05-14 DIAGNOSIS — R739 Hyperglycemia, unspecified: Secondary | ICD-10-CM | POA: Diagnosis not present

## 2018-05-14 DIAGNOSIS — R600 Localized edema: Secondary | ICD-10-CM | POA: Diagnosis not present

## 2018-05-14 DIAGNOSIS — E213 Hyperparathyroidism, unspecified: Secondary | ICD-10-CM

## 2018-05-14 DIAGNOSIS — E785 Hyperlipidemia, unspecified: Secondary | ICD-10-CM

## 2018-05-14 DIAGNOSIS — H9202 Otalgia, left ear: Secondary | ICD-10-CM

## 2018-05-14 DIAGNOSIS — I7381 Erythromelalgia: Secondary | ICD-10-CM

## 2018-05-14 DIAGNOSIS — D509 Iron deficiency anemia, unspecified: Secondary | ICD-10-CM

## 2018-05-14 DIAGNOSIS — I10 Essential (primary) hypertension: Secondary | ICD-10-CM | POA: Diagnosis not present

## 2018-05-14 DIAGNOSIS — M069 Rheumatoid arthritis, unspecified: Secondary | ICD-10-CM

## 2018-05-14 MED FILL — METOPROLOL SUCCINATE ER 50: 50 | 90 days supply | Qty: 180 | Fill #1

## 2018-05-14 NOTE — Patient Instructions (Addendum)
BEFORE YOU LEAVE: -follow up:  1) vaccine visit with Wendie Simmer for flu shot and lab visit for fasting labs in 1-2 weeks 2)follow up 3-4 months with Dr. Maudie Mercury  Call your ear, nose and throat doctor today.  Please call to reschedule your colonoscopy.    We have ordered labs or studies at this visit. It can take up to 1-2 weeks for results and processing. IF results require follow up or explanation, we will call you with instructions. Clinically stable results will be released to your [facility]. If you have not heard from [place] or cannot find your results in [facility] in 2 weeks please contact our office at [phone].  If you are not yet signed up for [facility] please consider signing up.   We recommend the following healthy lifestyle for LIFE: 1) Small portions. But, make sure to get regular (at least 3 per day), healthy meals and small healthy snacks if needed.  2) Eat a healthy clean diet.   TRY TO EAT: -at least 5-7 servings of low sugar, colorful, and nutrient rich vegetables per day (not corn, potatoes or bananas.) -berries are the best choice if you wish to eat fruit (only eat small amounts if trying to reduce weight)  -lean meets (fish, white meat of chicken or [place]) -vegan proteins for some meals - beans or tofu, whole grains, nuts and seeds -Replace bad fats with good fats - good fats include: fish, nuts and seeds, canola oil, olive oil -small amounts of low fat or non fat dairy -small amounts of100 % whole grains - check the lables -drink plenty of water  AVOID: -SUGAR, sweets, anything with added sugar, corn syrup or sweeteners - must read labels as even foods advertised as "healthy" often are loaded with sugar -if you must have a sweetener, small amounts of stevia may be best -sweetened beverages and artificially sweetened beverages -simple starches (rice, bread, potatoes, pasta, chips, etc - small amounts of 100% whole grains are ok) -red meat, pork, butter -fried foods, fast  food, processed food, excessive dairy, eggs and coconut.  3)Get at least 150 minutes of sweaty aerobic exercise per week.  4)Reduce stress - consider counseling, meditation and relaxation to balance other aspects of your life.

## 2018-05-18 DIAGNOSIS — J322 Chronic ethmoidal sinusitis: Secondary | ICD-10-CM | POA: Diagnosis not present

## 2018-05-18 DIAGNOSIS — J320 Chronic maxillary sinusitis: Secondary | ICD-10-CM | POA: Diagnosis not present

## 2018-05-18 DIAGNOSIS — J040 Acute laryngitis: Secondary | ICD-10-CM | POA: Diagnosis not present

## 2018-05-18 DIAGNOSIS — H6122 Impacted cerumen, left ear: Secondary | ICD-10-CM | POA: Diagnosis not present

## 2018-05-18 DIAGNOSIS — H6062 Unspecified chronic otitis externa, left ear: Secondary | ICD-10-CM | POA: Diagnosis not present

## 2018-05-18 MED FILL — AZITHROMYCIN 500 MG TABLET: 500 | 3 days supply | Qty: 3 | Fill #0

## 2018-05-18 MED FILL — HYDROXYCHLOROQUINE SULFATE: 200 | 30 days supply | Qty: 30 | Fill #1

## 2018-05-18 MED FILL — NEO/POLYMYXIN/HC EAR SUSP: 3.5-10000-1 | 90 days supply | Qty: 10 | Fill #0

## 2018-05-18 MED FILL — FLUCONAZOLE 100 MG TAB: 100 | 10 days supply | Qty: 3 | Fill #0

## 2018-05-26 MED FILL — GABAPENTIN 300 MG CAPSULE: 300 | 90 days supply | Qty: 180 | Fill #1

## 2018-05-28 ENCOUNTER — Encounter: Payer: Self-pay | Admitting: Internal Medicine

## 2018-05-28 ENCOUNTER — Ambulatory Visit (INDEPENDENT_AMBULATORY_CARE_PROVIDER_SITE_OTHER): Payer: 59 | Admitting: Internal Medicine

## 2018-05-28 ENCOUNTER — Ambulatory Visit: Payer: 59

## 2018-05-28 ENCOUNTER — Other Ambulatory Visit: Payer: 59

## 2018-05-28 VITALS — BP 180/101 | HR 62 | Temp 98.0°F | Ht 65.5 in | Wt 241.0 lb

## 2018-05-28 DIAGNOSIS — I7381 Erythromelalgia: Secondary | ICD-10-CM

## 2018-05-28 NOTE — Progress Notes (Signed)
[facility] for Infectious Disease  Reason for Consult: Erythromelalgia Referring Provider: Dr. Colin Benton  Assessment: I told her that I concur with the diagnoses of erythromelalgia.  It is associated with autoimmune disorders like rheumatoid arthritis.  There is no known cure.  It is not due to infection.  I do not think her improvement when hospitalized is likely due to antibiotics.  More than likely it is due to the fact that she was in bed with her feet elevated for several days each time.  She is scheduled to see her rheumatologist, Dr. Amil Amen in a few weeks.  I suggested that she discuss other management options with him.  She may benefit from lidocaine patches or other topical therapy to help control her pain.  If her pain were improved she might also benefit from trying the compressive stockings again.  Plan: 1. Follow-up with Dr. Amil Amen  Patient Active Problem List   Diagnosis Date Noted  . Erythromelalgia ([place]) 10/17/2017    Priority: High  . Morbid obesity ([place]) 05/12/2018  . Hyperglycemia 05/12/2018  . Lower extremity edema -chronic 05/12/2018  . Iron deficiency anemia 05/12/2018  . Hyperparathyroidism ([place]) 05/06/2017  . Lumbar stenosis with neurogenic claudication 10/11/2016  . H/O breast augmentation 09/12/2016  . L-S radiculopathy 05/30/2016  . Neck mass 05/30/2015  . Hx of colonic polyp - ssp 11/03/2014  . Neuropathy 07/06/2014  . Hyperlipidemia 06/02/2014  . Anxiety state 06/02/2014  . Allergic rhinitis   . Osteoporosis 10/17/2011  . Rheumatoid arthritis ([place])   . Hypertension     Patient's Medications  New Prescriptions   No medications on file  Previous Medications   ASPIRIN 81 MG TABLET    Take 81 mg by mouth at bedtime.    CASTELLANI PAINT MODIFIED 1.5 % LIQD    Paint between toes after foot soaks up to 3 times per day.   CHOLECALCIFEROL (VITAMIN D-3) 5000 UNITS TABS    Take 1 tablet by mouth daily.   FUROSEMIDE (LASIX) 20 MG TABLET     Take 2 tablets (40 mg total) by mouth daily.   GABAPENTIN (NEURONTIN) 300 MG CAPSULE    Take 1 capsule (300 mg total) by mouth 2 (two) times daily.   HYDROXYCHLOROQUINE (PLAQUENIL) 200 MG TABLET    Take 200 mg by mouth daily. Patient is currently taking   IRON PO    Take by mouth.   LOSARTAN (COZAAR) 100 MG TABLET    TAKE 1 TABLET BY MOUTH ONCE DAILY   METOPROLOL SUCCINATE (TOPROL-XL) 50 MG 24 HR TABLET    TAKE 2 TABLETS BY MOUTH DAILY. TAKE WITH OR IMMEDIATELY FOLLOWING A MEAL   NEOMYCIN-POLYMYXIN-HYDROCORTISONE (CORTISPORIN) 3.5-10000-1 OTIC SUSPENSION       RANITIDINE (ZANTAC) 150 MG TABLET    Take 150 mg by mouth daily as needed for heartburn.    SENNA-DOCUSATE (SENOKOT-S) 8.6-50 MG TABLET    Take 1 tablet by mouth 2 (two) times daily.   SULFASALAZINE (AZULFIDINE) 500 MG EC TABLET    TAKE 2 TABLETS BY MOUTH TWICE DAILY   TIZANIDINE (ZANAFLEX) 4 MG TABLET    Take 0.5-1 tablets (2-4 mg total) by mouth every 8 (eight) hours as needed for muscle spasms.   VIT B6-VIT B12-OMEGA 3 ACIDS (VITAMIN B PLUS+ PO)    Take 1 tablet by mouth every evening.  Modified Medications   No medications on file  Discontinued Medications   No medications on file  HPI: [name] is a 67 y.o. female with rheumatoid arthritis, neuropathy and chronic lower extremity lymphedema.  In 2013 she began to have increased swelling, redness and burning pain in her toes and feet.  It has been gradually getting worse since that time.  She has been diagnosed with erythromelalgia and has seen rheumatology, vascular surgery, dermatology, podiatry and neurology for this.  She states that the redness, swelling and pain gets worse when she stands and walks or if her feet get too hot.  She gets relief by soaking her feet in ice water and elevating her feet.  She recalls that she had significant improvement when she was in the hospital for back surgery several years ago and after falling and sustaining multiple fractures last  November.  She thought that she received antibiotics both times and that was why she improved.  She cannot tolerate the compressive stockings that she was fitted with several years ago because it increases her pain.  She does take gabapentin which helps but cannot titrate the dose up because of drowsiness.  She was started on citalopram but had some side effect that she cannot recall and stopped taking it.  She does take one aspirin daily.  Review of Systems: Review of Systems  Constitutional: Negative for chills, diaphoresis, fever and weight loss.  Skin: Positive for rash. Negative for itching.      Past Medical History:  Diagnosis Date  . Allergy   . Cataract    BILATERAL-REMOVED 2 YEARS AGO  . GERD (gastroesophageal reflux disease)   . Hx of colonic polyp - ssp 11/03/2014  . Hypercalcemia   . Hypertension   . Left maxillary fracture ([place]) 07/17/2017  . 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 Osteopenia 10/17/2011   DEXA 09/2007: -1.4 L fem; 10/2011: -1.2 L fem   . PONV (postoperative nausea and vomiting)   . Pseudogout of foot   . Rheumatoid arthritis(714.0) dx 2010  . Tibial plateau fracture, right, closed, initial encounter 07/16/2017    Social History   Tobacco Use  . Smoking status: Former Smoker    Packs/day: 4.00    Years: 4.00    Pack years: 16.00    Types: Cigarettes    Last attempt to quit: 1985    Years since quitting: 34.7  . Smokeless tobacco: Former Systems developer    Quit date: 08/19/1981  Substance Use Topics  . Alcohol use: No    Alcohol/week: 0.0 standard drinks  . Drug use: No    Family History  Problem Relation Age of Onset  . Stroke Mother   . Hypertension Father   . Heart attack Father   . Hypertension Sister        x 3  . Hypertension Brother   . Hyperthyroidism Sister        x2, s/p RAI ablation  . Hypothyroidism Brother    Allergies  Allergen Reactions  . Amlodipine Besylate Other (See Comments)    Tremors  . Nortriptyline Other (See Comments)      tremors  . Dilaudid [Hydromorphone Hcl]     Headache, muscle tightness    OBJECTIVE: Vitals:   05/28/18 1354  BP: (!) 180/101  Pulse: 62  Temp: 98 F (36.7 C)  TempSrc: Oral  Weight: 241 lb (109.3 kg)  Height: 5' 5.5" (1.664 m)   Body mass index is 39.49 kg/m.   Physical Exam  Constitutional: She is oriented to person, place, and time.  She is very pleasant and in good  spirits.  Neurological: She is alert and oriented to person, place, and time.  Skin:  She has 1+ pitting edema of both lower legs to mid shin.  She has bright red erythema of her toes and feet over the dorsum and plantar surfaces.    Psychiatric: She has a normal mood and affect.    Microbiology: No results found for this or any previous visit (from the past 240 hour(s)).  Michel Bickers, MD [facility] for [facility] [phone] pager   [phone] cell 05/28/2018, 2:28 PM

## 2018-06-04 ENCOUNTER — Ambulatory Visit (INDEPENDENT_AMBULATORY_CARE_PROVIDER_SITE_OTHER): Payer: 59 | Admitting: *Deleted

## 2018-06-04 ENCOUNTER — Other Ambulatory Visit (INDEPENDENT_AMBULATORY_CARE_PROVIDER_SITE_OTHER): Payer: 59

## 2018-06-04 DIAGNOSIS — R739 Hyperglycemia, unspecified: Secondary | ICD-10-CM

## 2018-06-04 DIAGNOSIS — Z23 Encounter for immunization: Secondary | ICD-10-CM

## 2018-06-04 DIAGNOSIS — I10 Essential (primary) hypertension: Secondary | ICD-10-CM

## 2018-06-04 DIAGNOSIS — E785 Hyperlipidemia, unspecified: Secondary | ICD-10-CM

## 2018-06-04 LAB — LIPID PANEL
Cholesterol: 240 mg/dL — ABNORMAL HIGH (ref 0–200)
HDL: 44.5 mg/dL (ref >39.00)
LDL Cholesterol: 158 mg/dL — ABNORMAL HIGH (ref 0–99)
NonHDL: 195.82
TRIGLYCERIDES: 188 mg/dL — AB (ref 0.0–149.0)
Total CHOL/HDL Ratio: 5
VLDL: 37.6 mg/dL (ref 0.0–40.0)

## 2018-06-04 LAB — BASIC METABOLIC PANEL
BUN: 16 mg/dL (ref 6–23)
CHLORIDE: 101 meq/L (ref 96–112)
CO2: 28 meq/L (ref 19–32)
CREATININE: 0.87 mg/dL (ref 0.40–1.20)
Calcium: 10.7 mg/dL — ABNORMAL HIGH (ref 8.4–10.5)
GFR: 68.53 mL/min (ref >60.00)
Glucose, Bld: 109 mg/dL — ABNORMAL HIGH (ref 70–99)
Potassium: 4 mEq/L (ref 3.5–5.1)
Sodium: 137 mEq/L (ref 135–145)

## 2018-06-04 LAB — HEMOGLOBIN A1C: Hgb A1c MFr Bld: 5.1 % (ref 4.6–6.5)

## 2018-06-04 MED FILL — sulfaSALAzine 500 MG TABS: 500 | 30 days supply | Qty: 120 | Fill #0

## 2018-06-16 ENCOUNTER — Other Ambulatory Visit: Payer: Self-pay | Admitting: Family Medicine

## 2018-06-16 MED FILL — LOSARTAN POTASSIUM 100 MG T: 100 | 90 days supply | Qty: 90 | Fill #0

## 2018-06-16 MED FILL — HYDROXYCHLOROQUINE SULFATE: 200 | 30 days supply | Qty: 30 | Fill #0

## 2018-06-28 ENCOUNTER — Emergency Department (HOSPITAL_COMMUNITY): Payer: 59

## 2018-06-28 ENCOUNTER — Emergency Department (HOSPITAL_BASED_OUTPATIENT_CLINIC_OR_DEPARTMENT_OTHER): Payer: 59

## 2018-06-28 ENCOUNTER — Emergency Department (HOSPITAL_COMMUNITY)
Admission: EM | Admit: 2018-06-28 | Discharge: 2018-06-28 | Disposition: A | Discharge status: Home / Self Care | Payer: 59 | Attending: Emergency Medicine | Admitting: Emergency Medicine

## 2018-06-28 ENCOUNTER — Other Ambulatory Visit: Payer: Self-pay

## 2018-06-28 ENCOUNTER — Encounter (HOSPITAL_COMMUNITY): Payer: Self-pay

## 2018-06-28 DIAGNOSIS — L03115 Cellulitis of right lower limb: Secondary | ICD-10-CM | POA: Insufficient documentation

## 2018-06-28 DIAGNOSIS — Z87891 Personal history of nicotine dependence: Secondary | ICD-10-CM | POA: Diagnosis not present

## 2018-06-28 DIAGNOSIS — Z79899 Other long term (current) drug therapy: Secondary | ICD-10-CM | POA: Diagnosis not present

## 2018-06-28 DIAGNOSIS — M7989 Other specified soft tissue disorders: Secondary | ICD-10-CM

## 2018-06-28 DIAGNOSIS — B349 Viral infection, unspecified: Secondary | ICD-10-CM | POA: Diagnosis not present

## 2018-06-28 DIAGNOSIS — Z7982 Long term (current) use of aspirin: Secondary | ICD-10-CM | POA: Insufficient documentation

## 2018-06-28 DIAGNOSIS — I82411 Acute embolism and thrombosis of right femoral vein: Secondary | ICD-10-CM | POA: Diagnosis not present

## 2018-06-28 DIAGNOSIS — I10 Essential (primary) hypertension: Secondary | ICD-10-CM | POA: Diagnosis not present

## 2018-06-28 DIAGNOSIS — M79604 Pain in right leg: Secondary | ICD-10-CM | POA: Diagnosis present

## 2018-06-28 DIAGNOSIS — R509 Fever, unspecified: Secondary | ICD-10-CM | POA: Diagnosis not present

## 2018-06-28 DIAGNOSIS — R5383 Other fatigue: Secondary | ICD-10-CM | POA: Diagnosis not present

## 2018-06-28 DIAGNOSIS — R079 Chest pain, unspecified: Secondary | ICD-10-CM | POA: Diagnosis not present

## 2018-06-28 LAB — COMPREHENSIVE METABOLIC PANEL
ALT: 22 U/L (ref 0–44)
AST: 17 U/L (ref 15–41)
Albumin: 3.9 g/dL (ref 3.5–5.0)
Alkaline Phosphatase: 79 U/L (ref 38–126)
Anion gap: 8 (ref 5–15)
BILIRUBIN TOTAL: 0.8 mg/dL (ref 0.3–1.2)
BUN: 11 mg/dL (ref 8–23)
CO2: 26 mmol/L (ref 22–32)
Calcium: 10 mg/dL (ref 8.9–10.3)
Chloride: 99 mmol/L (ref 98–111)
Creatinine, Ser: 0.86 mg/dL (ref 0.44–1.00)
GFR calc Af Amer: >60 mL/min (ref >60)
Glucose, Bld: 114 mg/dL — ABNORMAL HIGH (ref 70–99)
POTASSIUM: 3.5 mmol/L (ref 3.5–5.1)
Sodium: 133 mmol/L — ABNORMAL LOW (ref 135–145)
TOTAL PROTEIN: 7.5 g/dL (ref 6.5–8.1)

## 2018-06-28 LAB — CBC WITH DIFFERENTIAL/PLATELET
Abs Immature Granulocytes: 0.03 10*3/uL (ref 0.00–0.07)
BASOS PCT: 0 %
Basophils Absolute: 0 10*3/uL (ref 0.0–0.1)
EOS ABS: 0.1 10*3/uL (ref 0.0–0.5)
EOS PCT: 1 %
HCT: 42.9 % (ref 36.0–46.0)
Hemoglobin: 14.1 g/dL (ref 12.0–15.0)
Immature Granulocytes: 0 %
Lymphocytes Relative: 18 %
Lymphs Abs: 1.7 10*3/uL (ref 0.7–4.0)
MCH: 28.7 pg (ref 26.0–34.0)
MCHC: 32.9 g/dL (ref 30.0–36.0)
MCV: 87.2 fL (ref 80.0–100.0)
MONO ABS: 0.9 10*3/uL (ref 0.1–1.0)
Monocytes Relative: 10 %
NEUTROS ABS: 6.5 10*3/uL (ref 1.7–7.7)
Neutrophils Relative %: 71 %
PLATELETS: 185 10*3/uL (ref 150–400)
RBC: 4.92 MIL/uL (ref 3.87–5.11)
RDW: 12.4 % (ref 11.5–15.5)
WBC: 9.1 10*3/uL (ref 4.0–10.5)
nRBC: 0 % (ref 0.0–0.2)

## 2018-06-28 LAB — URINALYSIS, ROUTINE W REFLEX MICROSCOPIC
Bacteria, UA: NONE SEEN
Bilirubin Urine: NEGATIVE
GLUCOSE, UA: NEGATIVE mg/dL
Hgb urine dipstick: NEGATIVE
Ketones, ur: NEGATIVE mg/dL
Nitrite: NEGATIVE
PROTEIN: NEGATIVE mg/dL
Specific Gravity, Urine: 1.005 (ref 1.005–1.030)
pH: 6 (ref 5.0–8.0)

## 2018-06-28 LAB — I-STAT CG4 LACTIC ACID, ED: Lactic Acid, Venous: 1.23 mmol/L (ref 0.5–1.9)

## 2018-06-28 MED ORDER — CEPHALEXIN 500 MG PO CAPS: 500.0000 mg | ORAL_CAPSULE | Freq: Four times a day (QID) | ORAL | 0 refills | Status: DC

## 2018-06-28 MED ORDER — SULFAMETHOXAZOLE-TRIMETHOPRIM 800-160 MG PO TABS: 1.0000 | ORAL_TABLET | Freq: Two times a day (BID) | ORAL | 0 refills | Status: AC

## 2018-06-28 MED ORDER — CLINDAMYCIN PHOSPHATE 600 MG/50ML IV SOLN
600.0000 mg | Freq: Once | INTRAVENOUS | Status: AC
  Administered 2018-06-28: 600 mg via INTRAVENOUS
  Filled 2018-06-28: qty 50

## 2018-06-28 NOTE — Progress Notes (Signed)
VASCULAR LAB PRELIMINARY  PRELIMINARY  PRELIMINARY  PRELIMINARY  Right lower extremity venous duplex completed.    Preliminary report:  There is no DVT or SVT noted in the right lower extremity.  Enlarged inguinal lymph node noted.   Leigha Olberding, RVT 06/28/2018, 12:49 PM

## 2018-06-28 NOTE — ED Triage Notes (Signed)
Pt reports feeling achy with a fever causing her to lie in bed. Pt reports right leg pain, swelling and increased reddness that started yesterday. Pt went to [facility] this morning and they sent her here for a DVT study.

## 2018-06-28 NOTE — ED Notes (Signed)
Patient transported to Ultrasound 

## 2018-06-28 NOTE — ED Notes (Signed)
Patient transported to X-ray 

## 2018-06-28 NOTE — ED Provider Notes (Addendum)
[facility] EMERGENCY DEPARTMENT Provider Note   CSN: [id number] Arrival date & time: 06/28/18  1017     History   Chief Complaint Chief Complaint  Patient presents with  . Leg Pain    dvt study    HPI [name] is a 67 y.o. female.  Patient is a 67 year old female who presents with leg swelling and redness.  She has a history of chronic swelling to her lower extremities with redness in her feet.  She is noticed over the last 2 days that her right leg is more swollen and the redness has extended to the mid lower leg.  She is also noted to have a fever that started yesterday.  She denies any chest pain or shortness of breath.  No URI symptoms.  No cough or cold symptoms.  She has not taken anything for fever today.  She denies any urinary symptoms.  No abdominal pain.  She was seen in urgent care and sent over here for further evaluation for possible DVT.     Past Medical History:  Diagnosis Date  . Allergy   . Cataract    BILATERAL-REMOVED 2 YEARS AGO  . GERD (gastroesophageal reflux disease)   . Hx of colonic polyp - ssp 11/03/2014  . Hypercalcemia   . Hypertension   . Left maxillary fracture ([place]) 07/17/2017  . 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 Osteopenia 10/17/2011   DEXA 09/2007: -1.4 L fem; 10/2011: -1.2 L fem   . PONV (postoperative nausea and vomiting)   . Pseudogout of foot   . Rheumatoid arthritis(714.0) dx 2010  . Tibial plateau fracture, right, closed, initial encounter 07/16/2017    Patient Active Problem List   Diagnosis Date Noted  . Morbid obesity ([place]) 05/12/2018  . Hyperglycemia 05/12/2018  . Lower extremity edema -chronic 05/12/2018  . Iron deficiency anemia 05/12/2018  . Erythromelalgia ([place]) 10/17/2017  . Hyperparathyroidism ([place]) 05/06/2017  . Lumbar stenosis with neurogenic claudication 10/11/2016  . H/O breast augmentation 09/12/2016  . L-S radiculopathy 05/30/2016  . Neck mass 05/30/2015  . Hx of colonic polyp -  ssp 11/03/2014  . Neuropathy 07/06/2014  . Hyperlipidemia 06/02/2014  . Anxiety state 06/02/2014  . Allergic rhinitis   . Osteoporosis 10/17/2011  . Rheumatoid arthritis ([place])   . Hypertension     Past Surgical History:  Procedure Laterality Date  . BREAST BIOPSY  1972  . Broken wrist  2010  . CATARACT EXTRACTION  03/2012   left  . COLONOSCOPY    . COSMETIC SURGERY    . FRACTURE SURGERY    . INNER EAR SURGERY     busted ear drum  . MAXIMUM ACCESS (MAS)POSTERIOR LUMBAR INTERBODY FUSION (PLIF) 1 LEVEL N/A 10/11/2016   Procedure: Lumbar one-Sacral one Maximum access posterior lumbar interbody fusion;  Surgeon: Erline Levine, MD;  Location: [facility];  Service: Neurosurgery;  Laterality: N/A;  . ORIF WRIST FRACTURE Right 07/18/2017   Procedure: OPEN REDUCTION INTERNAL FIXATION (ORIF) WRIST FRACTURE;  Surgeon: Renette Butters, MD;  Location: [facility];  Service: Orthopedics;  Laterality: Right;  . pneumonia  2007     OB History   None      Home Medications    Prior to Admission medications   Medication Sig Start Date End Date Taking? Authorizing Provider  aspirin 81 MG tablet Take 81 mg by mouth at bedtime.     [provider]  Candee Furbish Paint Modified 1.5 % LIQD Paint between toes after foot  soaks up to 3 times per day. 04/03/18   Marzetta Board, DPM  cephALEXin (KEFLEX) 500 MG capsule Take 1 capsule (500 mg total) by mouth 4 (four) times daily. 06/28/18   Malvin Johns, MD  Cholecalciferol (VITAMIN D-3) 5000 units TABS Take 1 tablet by mouth daily.    [provider]  furosemide (LASIX) 20 MG tablet Take 2 tablets (40 mg total) by mouth daily. 01/09/18   Lucretia Kern, DO  gabapentin (NEURONTIN) 300 MG capsule Take 1 capsule (300 mg total) by mouth 2 (two) times daily. 02/09/18   Narda Amber K, DO  hydroxychloroquine (PLAQUENIL) 200 MG tablet Take 200 mg by mouth daily. Patient is currently taking    [provider]  IRON PO Take by mouth.     [provider]  losartan (COZAAR) 100 MG tablet TAKE 1 TABLET BY MOUTH ONCE DAILY 06/16/18   Colin Benton R, DO  metoprolol succinate (TOPROL-XL) 50 MG 24 hr tablet TAKE 2 TABLETS BY MOUTH DAILY. TAKE WITH OR IMMEDIATELY FOLLOWING A MEAL 02/17/18   Lucretia Kern, DO  neomycin-polymyxin-hydrocortisone (CORTISPORIN) 3.5-10000-1 OTIC suspension  11/12/17   [provider]  ranitidine (ZANTAC) 150 MG tablet Take 150 mg by mouth daily as needed for heartburn.     [provider]  senna-docusate (SENOKOT-S) 8.6-50 MG tablet Take 1 tablet by mouth 2 (two) times daily. 07/23/17   Rai, Vernelle Emerald, MD  sulfamethoxazole-trimethoprim (BACTRIM DS,SEPTRA DS) 800-160 MG tablet Take 1 tablet by mouth 2 (two) times daily for 7 days. 06/28/18 07/05/18  Malvin Johns, MD  sulfaSALAzine (AZULFIDINE) 500 MG EC tablet TAKE 2 TABLETS BY MOUTH TWICE DAILY 07/17/16   Bo Merino, MD  tiZANidine (ZANAFLEX) 4 MG tablet Take 0.5-1 tablets (2-4 mg total) by mouth every 8 (eight) hours as needed for muscle spasms. 12/11/17   Lucretia Kern, DO  Vit B6-Vit B12-Omega 3 Acids (VITAMIN B PLUS+ PO) Take 1 tablet by mouth every evening.    [provider]    Family History Family History  Problem Relation Age of Onset  . Stroke Mother   . Hypertension Father   . Heart attack Father   . Hypertension Sister        x 3  . Hypertension Brother   . Hyperthyroidism Sister        x2, s/p RAI ablation  . Hypothyroidism Brother     Social History Social History   Tobacco Use  . Smoking status: Former Smoker    Packs/day: 4.00    Years: 4.00    Pack years: 16.00    Types: Cigarettes    Last attempt to quit: 1985    Years since quitting: 34.8  . Smokeless tobacco: Former Systems developer    Quit date: 08/19/1981  Substance Use Topics  . Alcohol use: No    Alcohol/week: 0.0 standard drinks  . Drug use: No     Allergies   Amlodipine besylate; Nortriptyline; and Dilaudid [hydromorphone  hcl]   Review of Systems Review of Systems  Constitutional: Positive for fatigue and fever. Negative for chills and diaphoresis.  HENT: Negative for congestion, rhinorrhea and sneezing.   Eyes: Negative.   Respiratory: Negative for cough, chest tightness and shortness of breath.   Cardiovascular: Negative for chest pain and leg swelling.  Gastrointestinal: Negative for abdominal pain, blood in stool, diarrhea, nausea and vomiting.  Genitourinary: Negative for difficulty urinating, flank pain, frequency and hematuria.  Musculoskeletal: Negative for arthralgias and back pain.  Leg swelling and redness  Skin: Positive for color change. Negative for rash.  Neurological: Negative for dizziness, speech difficulty, weakness, numbness and headaches.     Physical Exam Updated Vital Signs BP (!) 143/63 (BP Location: Right Arm)   Pulse 73   Temp 98.5 F (36.9 C) (Oral)   Resp 16   Ht 5\' 6"  (1.676 m)   Wt 107.5 kg   SpO2 100%   BMI 38.25 kg/m   Physical Exam  Constitutional: She is oriented to person, place, and time. She appears well-developed and well-nourished.  HENT:  Head: Normocephalic and atraumatic.  Eyes: Pupils are equal, round, and reactive to light.  Neck: Normal range of motion. Neck supple.  Cardiovascular: Normal rate, regular rhythm and normal heart sounds.  Pulmonary/Chest: Effort normal and breath sounds normal. No respiratory distress. She has no wheezes. She has no rales. She exhibits no tenderness.  Abdominal: Soft. Bowel sounds are normal. There is no tenderness. There is no rebound and no guarding.  Musculoskeletal: Normal range of motion. She exhibits edema.  Patient has some edema of both lower extremities with some increase edema to the right lower extremity extending to the knee.  There is some erythema of both feet but the erythema on the right leg extends to the mid lower leg.  Patient states that the erythema to the feet is chronic but the right leg  erythema has extended to the mid lower leg.  Pedal pulses are intact.  There is no open wounds.  Lymphadenopathy:    She has no cervical adenopathy.  Neurological: She is alert and oriented to person, place, and time.  Skin: Skin is warm and dry. No rash noted.  Psychiatric: She has a normal mood and affect.     ED Treatments / Results  Labs (all labs ordered are listed, but only abnormal results are displayed) Labs Reviewed  COMPREHENSIVE METABOLIC PANEL - Abnormal; Notable for the following components:      Result Value   Sodium 133 (*)    Glucose, Bld 114 (*)    All other components within normal limits  URINALYSIS, ROUTINE W REFLEX MICROSCOPIC - Abnormal; Notable for the following components:   Leukocytes, UA LARGE (*)    All other components within normal limits  CULTURE, BLOOD (ROUTINE X 2)  CULTURE, BLOOD (ROUTINE X 2)  CBC WITH DIFFERENTIAL/PLATELET  I-STAT CG4 LACTIC ACID, ED    EKG EKG Interpretation  Date/Time:  Sunday June 28 2018 10:27:16 EST Ventricular Rate:  75 PR Interval:    QRS Duration: 110 QT Interval:  403 QTC Calculation: 451 R Axis:   -36 Text Interpretation:  Sinus rhythm Left axis deviation since last tracing no significant change Confirmed by Malvin Johns [id number]) on 06/28/2018 11:51:08 AM   Radiology Dg Chest 2 View  Result Date: 06/28/2018 CLINICAL DATA:  Pt reports feeling achy with a fever causing her to lie in bed. Pt reports right leg pain, swelling and increased reddness that started yesterday. Pt went to [facility] this morning and they sent her here for a DVT study. EXAM: CHEST - 2 VIEW COMPARISON:  07/17/2017 FINDINGS: Mild enlargement of the cardiopericardial silhouette. No mediastinal or hilar masses. No evidence of adenopathy. Clear lungs.  No pleural effusion or pneumothorax. Skeletal structures are demineralized but intact. IMPRESSION: No acute cardiopulmonary disease. Electronically Signed   By: Lajean Manes M.D.    On: 06/28/2018 11:38    Procedures Procedures (including critical care time)  Medications Ordered  in ED Medications  clindamycin (CLEOCIN) IVPB 600 mg ( Intravenous Stopped 06/28/18 1124)     Initial Impression / Assessment and Plan / ED Course  I have reviewed the triage vital signs and the nursing notes.  Pertinent labs & imaging results that were available during my care of the patient were reviewed by me and considered in my medical decision making (see chart for details).     Patient presents with increased swelling and redness to her right lower leg.  She had a fever yesterday but has not had any fever today.  She has no evidence of DVT on ultrasound.  Her white count is normal.  Her lactate is normal.  She has no other suggestions of sepsis.  She was given IV clindamycin.  Her urinalysis does have some suggestions of infection.  I discussed patient options of home treatment versus admission and patient wants to try home treatment first.  I will discharge her with prescriptions for Keflex and Bactrim given that there is also a question of a UTI.  I encouraged her to have close follow-up with her PCP tomorrow for recheck.  She was given strict return precautions.  She was advised to keep her leg elevated.  The area was marked with a skin marker and she was advised to return immediately if she has any progression of the redness past the marked areas.  The antibiotics were electronically prescribed and she was advised to start them later today.  Return precautions were given.  Final Clinical Impressions(s) / ED Diagnoses   Final diagnoses:  Cellulitis of right lower extremity    ED Discharge Orders         Ordered    sulfamethoxazole-trimethoprim (BACTRIM DS,SEPTRA DS) 800-160 MG tablet  2 times daily     06/28/18 1315    cephALEXin (KEFLEX) 500 MG capsule  4 times daily     06/28/18 1315           Malvin Johns, MD 06/28/18 1322    Malvin Johns, MD 06/28/18 1334

## 2018-06-29 ENCOUNTER — Telehealth: Payer: Self-pay

## 2018-06-29 NOTE — Telephone Encounter (Signed)
Copied from [place] [id number]. Topic: General - Inquiry >> Jun 29, 2018  9:36 AM Scherrie Gerlach wrote: Reason for CRM: pt was in the ED yesterday with infection (cellulitis) .  Pt states she was to let Dr Maudie Mercury know about this.  Pt states she is better today. It is very hard for her to walk.  Pt states they drew a line on her leg, and the redness should not go over this line, and it has not. Pt states she is to stay off her leg, but just following hospital directions to touch base with Dr Maudie Mercury.   Will await a call back.

## 2018-06-29 NOTE — Telephone Encounter (Signed)
Would recommend a follow-up sometime this week to recheck.

## 2018-06-29 NOTE — Telephone Encounter (Signed)
I called the pt and informed her of the message below.  Patient scheduled an appt for 11/21 as she stated she cannot come in this week due to other appts.

## 2018-07-02 ENCOUNTER — Ambulatory Visit: Payer: 59 | Admitting: Internal Medicine

## 2018-07-03 LAB — CULTURE, BLOOD (ROUTINE X 2)
Culture: NO GROWTH
Culture: NO GROWTH
Special Requests: ADEQUATE

## 2018-07-06 ENCOUNTER — Ambulatory Visit: Payer: 59 | Admitting: Adult Health

## 2018-07-06 ENCOUNTER — Encounter: Payer: Self-pay | Admitting: Adult Health

## 2018-07-06 ENCOUNTER — Ambulatory Visit: Payer: Self-pay | Admitting: *Deleted

## 2018-07-06 VITALS — BP 140/82 | Temp 98.8°F | Wt 244.0 lb

## 2018-07-06 DIAGNOSIS — L03115 Cellulitis of right lower limb: Secondary | ICD-10-CM

## 2018-07-06 MED ORDER — CEPHALEXIN 500 MG PO CAPS: 500.0000 mg | ORAL_CAPSULE | Freq: Four times a day (QID) | ORAL | 0 refills | Status: AC

## 2018-07-06 MED FILL — CEPHALEXIN 500 MG CAPSULE: 500 | 10 days supply | Qty: 40 | Fill #0

## 2018-07-06 MED FILL — sulfaSALAzine 500 MG TABS: 500 | 30 days supply | Qty: 120 | Fill #0

## 2018-07-06 NOTE — Telephone Encounter (Signed)
Patient was seen at ED for cellulitis- she has finished her antibiotic- but she still has redness and swelling in her leg- appointment to check leg and possibly continue her therapy scheduled.  Reason for Disposition . [1] Red area or streak AND [2] large (> 2 in. or 5 cm)    Patient was treated for cellulitis at ED 11/10- she has finished treatment- but her leg is not completely better.  Answer Assessment - Initial Assessment Questions 1. ONSET: "When did the pain start?"      Patient was treated for cellulitis- 11/10 and she states her leg is still red and painful 2. LOCATION: "Where is the pain located?"      R lower leg 3. PAIN: "How bad is the pain?"    (Scale 1-10; or mild, moderate, severe)   -  MILD (1-3): doesn't interfere with normal activities    -  MODERATE (4-7): interferes with normal activities (e.g., work or school) or awakens from sleep, limping    -  SEVERE (8-10): excruciating pain, unable to do any normal activities, unable to walk     4-5 4. WORK OR EXERCISE: "Has there been any recent work or exercise that involved this part of the body?"      Diagnosed cellulitis  5. CAUSE: "What do you think is causing the leg pain?"     Healing cellulitis 6. OTHER SYMPTOMS: "Do you have any other symptoms?" (e.g., chest pain, back pain, breathing difficulty, swelling, rash, fever, numbness, weakness)     Leg has blistered 7. PREGNANCY: "Is there any chance you are pregnant?" "When was your last menstrual period?"     n/a  Protocols used: LEG PAIN-A-AH

## 2018-07-06 NOTE — Telephone Encounter (Signed)
Noted  

## 2018-07-06 NOTE — Progress Notes (Signed)
Subjective:    Patient ID: [name], female    DOB: Jan 28, 1951, 67 y.o.   MRN: [id number]  HPI 67 year old female who  has a past medical history of Allergy, Cataract, GERD (gastroesophageal reflux disease), Hx of colonic polyp - ssp (11/03/2014), Hypercalcemia, Hypertension, Left maxillary fracture ([place]) (07/17/2017), Osteoarthritis of hand (10/17/2011), Osteopenia (10/17/2011), PONV (postoperative nausea and vomiting), Pseudogout of foot, Rheumatoid arthritis(714.0) (dx 2010), and Tibial plateau fracture, right, closed, initial encounter (07/16/2017).  She is a patient of Dr. Maudie Mercury, who I am seeing today for ER follow up. She was seen in the ER on 11.10.2019 and diagnosed with cellulitis of right lower extremity. Ultrasound was negative for DVT. WBC and lactate were normal. She was given IV clindamycin. She was discharged with a prescription for Keflex and Bactrim given that there was question of UTI.   She finished keflex therapy las tnight and although she notes improvement ( less red, warm, and swollen) in her symptoms her right leg continues to be red, warm, and tender. She denies any fevers or feeling acutely ill.       Review of Systems See HPI   Past Medical History:  Diagnosis Date  . Allergy   . Cataract    BILATERAL-REMOVED 2 YEARS AGO  . GERD (gastroesophageal reflux disease)   . Hx of colonic polyp - ssp 11/03/2014  . Hypercalcemia   . Hypertension   . Left maxillary fracture ([place]) 07/17/2017  . 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 Osteopenia 10/17/2011   DEXA 09/2007: -1.4 L fem; 10/2011: -1.2 L fem   . PONV (postoperative nausea and vomiting)   . Pseudogout of foot   . Rheumatoid arthritis(714.0) dx 2010  . Tibial plateau fracture, right, closed, initial encounter 07/16/2017    Social History   Socioeconomic History  . Marital status: Married    Spouse name: Not on file  . Number of children: 3  . Years of education: Not on file  . Highest education level:  Not on file  Occupational History  . Occupation: Retired  Scientific laboratory technician  . Financial resource strain: Not on file  . Food insecurity:    Worry: Not on file    Inability: Not on file  . Transportation needs:    Medical: Not on file    Non-medical: Not on file  Tobacco Use  . Smoking status: Former Smoker    Packs/day: 4.00    Years: 4.00    Pack years: 16.00    Types: Cigarettes    Last attempt to quit: 1985    Years since quitting: 34.9  . Smokeless tobacco: Former Systems developer    Quit date: 08/19/1981  Substance and Sexual Activity  . Alcohol use: No    Alcohol/week: 0.0 standard drinks  . Drug use: No  . Sexual activity: Not on file  Lifestyle  . Physical activity:    Days per week: Not on file    Minutes per session: Not on file  . Stress: Not on file  Relationships  . Social connections:    Talks on phone: Not on file    Gets together: Not on file    Attends religious service: Not on file    Active member of club or organization: Not on file    Attends meetings of clubs or organizations: Not on file    Relationship status: Not on file  . Intimate partner violence:    Fear of current or ex partner: Not on file  Emotionally abused: Not on file    Physically abused: Not on file    Forced sexual activity: Not on file  Other Topics Concern  . Not on file  Social History Narrative   Artist -retired Building control surveyor   Married, lives with spouse, [name], he is IT support for [employer]   3 sons   2 caffeinated beverages a day   No regular exercise, diet is ok    Past Surgical History:  Procedure Laterality Date  . BREAST BIOPSY  1972  . Broken wrist  2010  . CATARACT EXTRACTION  03/2012   left  . COLONOSCOPY    . COSMETIC SURGERY    . FRACTURE SURGERY    . INNER EAR SURGERY     busted ear drum  . MAXIMUM ACCESS (MAS)POSTERIOR LUMBAR INTERBODY FUSION (PLIF) 1 LEVEL N/A 10/11/2016   Procedure: Lumbar one-Sacral one Maximum access posterior lumbar  interbody fusion;  Surgeon: Erline Levine, MD;  Location: [facility];  Service: Neurosurgery;  Laterality: N/A;  . ORIF WRIST FRACTURE Right 07/18/2017   Procedure: OPEN REDUCTION INTERNAL FIXATION (ORIF) WRIST FRACTURE;  Surgeon: Renette Butters, MD;  Location: [facility];  Service: Orthopedics;  Laterality: Right;  . pneumonia  2007    Family History  Problem Relation Age of Onset  . Stroke Mother   . Hypertension Father   . Heart attack Father   . Hypertension Sister        x 3  . Hypertension Brother   . Hyperthyroidism Sister        x2, s/p RAI ablation  . Hypothyroidism Brother     Allergies  Allergen Reactions  . Amlodipine Besylate Other (See Comments)    Tremors  . Nortriptyline Other (See Comments)    tremors  . Dilaudid [Hydromorphone Hcl]     Headache, muscle tightness    Current Outpatient Medications on File Prior to Visit  Medication Sig Dispense Refill  . aspirin 81 MG tablet Take 81 mg by mouth at bedtime.     Candee Furbish Paint Modified 1.5 % LIQD Paint between toes after foot soaks up to 3 times per day. 1 Bottle 2  . Cholecalciferol (VITAMIN D-3) 5000 units TABS Take 1 tablet by mouth daily.    . furosemide (LASIX) 20 MG tablet Take 2 tablets (40 mg total) by mouth daily. 180 tablet 1  . gabapentin (NEURONTIN) 300 MG capsule Take 1 capsule (300 mg total) by mouth 2 (two) times daily. 180 capsule 3  . hydroxychloroquine (PLAQUENIL) 200 MG tablet Take 200 mg by mouth daily. Patient is currently taking    . IRON PO Take by mouth.    . losartan (COZAAR) 100 MG tablet TAKE 1 TABLET BY MOUTH ONCE DAILY 90 tablet 1  . metoprolol succinate (TOPROL-XL) 50 MG 24 hr tablet TAKE 2 TABLETS BY MOUTH DAILY. TAKE WITH OR IMMEDIATELY FOLLOWING A MEAL 180 tablet 1  . neomycin-polymyxin-hydrocortisone (CORTISPORIN) 3.5-10000-1 OTIC suspension   0  . ranitidine (ZANTAC) 150 MG tablet Take 150 mg by mouth daily as needed for heartburn.     . senna-docusate (SENOKOT-S) 8.6-50 MG tablet  Take 1 tablet by mouth 2 (two) times daily.    Marland Kitchen sulfaSALAzine (AZULFIDINE) 500 MG EC tablet TAKE 2 TABLETS BY MOUTH TWICE DAILY 120 tablet 0  . tiZANidine (ZANAFLEX) 4 MG tablet Take 0.5-1 tablets (2-4 mg total) by mouth every 8 (eight) hours as needed for muscle spasms. 20 tablet 0  . Vit  B6-Vit B12-Omega 3 Acids (VITAMIN B PLUS+ PO) Take 1 tablet by mouth every evening.     No current facility-administered medications on file prior to visit.     BP 140/82   Temp 98.8 F (37.1 C)   Wt 244 lb (110.7 kg)   BMI 39.38 kg/m       Objective:   Physical Exam  Constitutional: She is oriented to person, place, and time. She appears well-developed and well-nourished. No distress.  Cardiovascular: Normal rate, regular rhythm, normal heart sounds and intact distal pulses. Exam reveals no gallop and no friction rub.  No murmur heard. Pulmonary/Chest: Effort normal and breath sounds normal. No stridor. No respiratory distress. She has no wheezes. She has no rales. She exhibits no tenderness.  Neurological: She is alert and oriented to person, place, and time.  Skin: Skin is warm and dry. She is not diaphoretic. There is erythema.  Mild erythema noted on right lower extremity. + warmness to touch and tenderness. + 1 pitting edema.   No calf tenderness   Psychiatric: She has a normal mood and affect. Her behavior is normal. Judgment and thought content normal.  Nursing note and vitals reviewed.     Assessment & Plan:  1. Cellulitis of right lower extremity - Appears to be clearing but not resolved. Will continue with Keflex x 10 days. Advised to elevated leg when at rest.  - Follow up with PCP in one week or sooner if needed - cephALEXin (KEFLEX) 500 MG capsule; Take 1 capsule (500 mg total) by mouth 4 (four) times daily for 10 days.  Dispense: 40 capsule; Refill: 0  Dorothyann Peng, NP

## 2018-07-06 NOTE — Patient Instructions (Signed)
It was great meeting you today   I think your infection is improving but is not completely gone yet. I have sent in 10 days of Keflex .  Please follow up with Dr. Maudie Mercury on Monday Nov. 25th or sooner if needed

## 2018-07-09 ENCOUNTER — Ambulatory Visit: Payer: 59 | Admitting: Family Medicine

## 2018-07-09 DIAGNOSIS — M2550 Pain in unspecified joint: Secondary | ICD-10-CM | POA: Diagnosis not present

## 2018-07-09 DIAGNOSIS — M5441 Lumbago with sciatica, right side: Secondary | ICD-10-CM | POA: Diagnosis not present

## 2018-07-09 DIAGNOSIS — E669 Obesity, unspecified: Secondary | ICD-10-CM | POA: Diagnosis not present

## 2018-07-09 DIAGNOSIS — I7381 Erythromelalgia: Secondary | ICD-10-CM | POA: Diagnosis not present

## 2018-07-09 DIAGNOSIS — M5136 Other intervertebral disc degeneration, lumbar region: Secondary | ICD-10-CM | POA: Diagnosis not present

## 2018-07-09 DIAGNOSIS — Z6839 Body mass index (BMI) 39.0-39.9, adult: Secondary | ICD-10-CM | POA: Diagnosis not present

## 2018-07-09 DIAGNOSIS — R5382 Chronic fatigue, unspecified: Secondary | ICD-10-CM | POA: Diagnosis not present

## 2018-07-09 DIAGNOSIS — M0609 Rheumatoid arthritis without rheumatoid factor, multiple sites: Secondary | ICD-10-CM | POA: Diagnosis not present

## 2018-07-09 MED FILL — predniSONE 5 MG TABS: 5 | 12 days supply | Qty: 48 | Fill #0

## 2018-07-10 MED FILL — HYDROXYCHLOROQUINE SULFATE: 200 | 90 days supply | Qty: 90 | Fill #0

## 2018-07-10 NOTE — Progress Notes (Signed)
HPI:  Using dictation device. Unfortunately this device frequently misinterprets words/phrases.  Due for colon cancer screening. Have advised a number of times. Hx of serrated sessile polyp 69mm and due for recheck per GI recommendation.  [name] is a pleasant 67 yo here for follow up of cellulitis: -PMH sig for morbid obesity, chronic LE edema, erythromyalgia of the lower extremities - seen by a number of specialist -she chronically has very red lower extremities with swelling an pain -ER visit 06/28/18 with Dx cellulitis - no white count, no fever at the time. She was give IV clinda in ER and discharged on bactrim and keflex. At f/u here on 07/06/18 she was given an extended course of keflex.  -reports is doing much better -she has continued redness and swelling in legs and lower extremities, back to baseline -does not use compression - too uncomfortable -does elevation -frustrated with weight - feels eats healthy, but feels metabolism has changed -hx poor compliance - did not do colon cancer screening - agrees to reschedule -she has chronic low back pain, wants to check urine as has hx uti, no dysuria, fevers, hematuria  Seeing Dr. Cruzita Lederer in Endo for elevated calcium. Elevated last check here.    ROS: See pertinent positives and negatives per HPI.  Past Medical History:  Diagnosis Date  . Allergy   . Cataract    BILATERAL-REMOVED 2 YEARS AGO  . GERD (gastroesophageal reflux disease)   . Hx of colonic polyp - ssp 11/03/2014  . Hypercalcemia   . Hypertension   . Left maxillary fracture ([place]) 07/17/2017  . 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 Osteopenia 10/17/2011   DEXA 09/2007: -1.4 L fem; 10/2011: -1.2 L fem   . PONV (postoperative nausea and vomiting)   . Pseudogout of foot   . Rheumatoid arthritis(714.0) dx 2010  . Tibial plateau fracture, right, closed, initial encounter 07/16/2017    Past Surgical History:  Procedure Laterality Date  . BREAST BIOPSY  1972  .  Broken wrist  2010  . CATARACT EXTRACTION  03/2012   left  . COLONOSCOPY    . COSMETIC SURGERY    . FRACTURE SURGERY    . INNER EAR SURGERY     busted ear drum  . MAXIMUM ACCESS (MAS)POSTERIOR LUMBAR INTERBODY FUSION (PLIF) 1 LEVEL N/A 10/11/2016   Procedure: Lumbar one-Sacral one Maximum access posterior lumbar interbody fusion;  Surgeon: Erline Levine, MD;  Location: [facility];  Service: Neurosurgery;  Laterality: N/A;  . ORIF WRIST FRACTURE Right 07/18/2017   Procedure: OPEN REDUCTION INTERNAL FIXATION (ORIF) WRIST FRACTURE;  Surgeon: Renette Butters, MD;  Location: [facility];  Service: Orthopedics;  Laterality: Right;  . pneumonia  2007    Family History  Problem Relation Age of Onset  . Stroke Mother   . Hypertension Father   . Heart attack Father   . Hypertension Sister        x 3  . Hypertension Brother   . Hyperthyroidism Sister        x2, s/p RAI ablation  . Hypothyroidism Brother     SOCIAL HX: see hpi   Current Outpatient Medications:  .  aspirin 81 MG tablet, Take 81 mg by mouth at bedtime. , Disp: , Rfl:  .  Castellani Paint Modified 1.5 % LIQD, Paint between toes after foot soaks up to 3 times per day., Disp: 1 Bottle, Rfl: 2 .  cephALEXin (KEFLEX) 500 MG capsule, Take 1 capsule (500 mg total) by mouth 4 (  four) times daily for 10 days., Disp: 40 capsule, Rfl: 0 .  Cholecalciferol (VITAMIN D-3) 5000 units TABS, Take 1 tablet by mouth daily., Disp: , Rfl:  .  furosemide (LASIX) 20 MG tablet, Take 2 tablets (40 mg total) by mouth daily., Disp: 180 tablet, Rfl: 1 .  gabapentin (NEURONTIN) 300 MG capsule, Take 1 capsule (300 mg total) by mouth 2 (two) times daily., Disp: 180 capsule, Rfl: 3 .  hydroxychloroquine (PLAQUENIL) 200 MG tablet, Take 200 mg by mouth daily. Patient is currently taking, Disp: , Rfl:  .  IRON PO, Take by mouth., Disp: , Rfl:  .  losartan (COZAAR) 100 MG tablet, TAKE 1 TABLET BY MOUTH ONCE DAILY, Disp: 90 tablet, Rfl: 1 .  metoprolol succinate  (TOPROL-XL) 50 MG 24 hr tablet, TAKE 2 TABLETS BY MOUTH DAILY. TAKE WITH OR IMMEDIATELY FOLLOWING A MEAL, Disp: 180 tablet, Rfl: 1 .  neomycin-polymyxin-hydrocortisone (CORTISPORIN) 3.5-10000-1 OTIC suspension, , Disp: , Rfl: 0 .  predniSONE (DELTASONE) 5 MG tablet, , Disp: , Rfl: 0 .  ranitidine (ZANTAC) 150 MG tablet, Take 150 mg by mouth daily as needed for heartburn. , Disp: , Rfl:  .  senna-docusate (SENOKOT-S) 8.6-50 MG tablet, Take 1 tablet by mouth 2 (two) times daily., Disp: , Rfl:  .  sulfaSALAzine (AZULFIDINE) 500 MG EC tablet, TAKE 2 TABLETS BY MOUTH TWICE DAILY, Disp: 120 tablet, Rfl: 0 .  tiZANidine (ZANAFLEX) 4 MG tablet, Take 0.5-1 tablets (2-4 mg total) by mouth every 8 (eight) hours as needed for muscle spasms., Disp: 20 tablet, Rfl: 0 .  Vit B6-Vit B12-Omega 3 Acids (VITAMIN B PLUS+ PO), Take 1 tablet by mouth every evening., Disp: , Rfl:   EXAM:  Vitals:   07/13/18 0954  BP: 116/70  Pulse: 60  Temp: 98.3 F (36.8 C)    Body mass index is 38.9 kg/m.  GENERAL: vitals reviewed and listed above, alert, oriented, appears well hydrated and in no acute distress  HEENT: atraumatic, conjunttiva clear, no obvious abnormalities on inspection of external nose and ears  NECK: no obvious masses on inspection  LUNGS: clear to auscultation bilaterally, no wheezes, rales or rhonchi, good air movement  CV: HRRR, no peripheral edema  MS: moves all extremities without noticeable abnormality  SKIN: mild erythema and edema bilat feet and ankles at baseline  PSYCH: pleasant and cooperative, no obvious depression or anxiety  ASSESSMENT AND PLAN:  Discussed the following assessment and plan:  Cellulitis of lower extremity, unspecified laterality  Morbid obesity (HCC)  Low back pain, unspecified back pain laterality, unspecified chronicity, unspecified whether sciatica present - Plan: POC Urinalysis Dipstick  -unsure if this was cellulitis or exacerbation of chronic issues -  sees many specialist; I've advised elevation, compression, healthy diet, wt reduction, regular gentle exercise, lasix as needed -advised [facility] to assist with the wt reduction - she agrees to consider, advised assistant to provide er the information to schedule -advised of need for follow up colon polyps/cancer screening, offered to assist, she agrees to call to schedule. Advised assistant to call her in 1 week to ensure scheduled. -DBP elevated on arrival, great on recheck -follow up 3 months -Patient advised to return or notify a doctor immediately if symptoms worsen or persist or new concerns arise.  Patient Instructions  BEFORE YOU LEAVE: -Wendie Simmer, set reminder to call her in 1 week to ensure colonoscopy scheduled -information [facility] -follow up: as scheduled   Please call today and  schedule colonoscopy.  [facility] - can be tough to get in so please call now to get the ball rolling.  Elevate, compression if at all possible.  Increase lasix to 3 tablets daily for 3 days when increased swelling.    We recommend the following healthy lifestyle for LIFE: 1) Small portions. But, make sure to get regular (at least 3 per day), healthy meals and small healthy snacks if needed.  2) Eat a healthy clean diet.   TRY TO EAT: -at least 5-7 servings of low sugar, colorful, and nutrient rich vegetables per day (not corn, potatoes or bananas.) -berries are the best choice if you wish to eat fruit (only eat small amounts if trying to reduce weight)  -lean meets (fish, white meat of chicken or [place]) -vegan proteins for some meals - beans or tofu, whole grains, nuts and seeds -Replace bad fats with good fats - good fats include: fish, nuts and seeds, canola oil, olive oil -small amounts of low fat or non fat dairy -small amounts of100 % whole grains - check the lables -drink plenty of water  AVOID: -SUGAR,  sweets, anything with added sugar, corn syrup or sweeteners - must read labels as even foods advertised as "healthy" often are loaded with sugar -if you must have a sweetener, small amounts of stevia may be best -sweetened beverages and artificially sweetened beverages -simple starches (rice, bread, potatoes, pasta, chips, etc - small amounts of 100% whole grains are ok) -red meat, pork, butter -fried foods, fast food, processed food, excessive dairy, eggs and coconut.  3)Get at least 150 minutes of sweaty aerobic exercise per week.  4)Reduce stress - consider counseling, meditation and relaxation to balance other aspects of your life.        Lucretia Kern, DO

## 2018-07-13 ENCOUNTER — Encounter: Payer: Self-pay | Admitting: Family Medicine

## 2018-07-13 ENCOUNTER — Ambulatory Visit: Payer: 59 | Admitting: Family Medicine

## 2018-07-13 VITALS — BP 116/70 | HR 60 | Temp 98.3°F | Ht 66.0 in | Wt 241.0 lb

## 2018-07-13 DIAGNOSIS — L03119 Cellulitis of unspecified part of limb: Secondary | ICD-10-CM

## 2018-07-13 DIAGNOSIS — M545 Low back pain, unspecified: Secondary | ICD-10-CM

## 2018-07-13 LAB — POCT URINALYSIS DIPSTICK
Bilirubin, UA: NEGATIVE
Blood, UA: NEGATIVE
GLUCOSE UA: NEGATIVE
KETONES UA: NEGATIVE
Nitrite, UA: NEGATIVE
Protein, UA: NEGATIVE
SPEC GRAV UA: 1.02 (ref 1.010–1.025)
Urobilinogen, UA: 0.2 E.U./dL
pH, UA: 5 (ref 5.0–8.0)

## 2018-07-13 MED FILL — FUROSEMIDE 20 MG TABS: 20 | 90 days supply | Qty: 180 | Fill #1

## 2018-07-13 NOTE — Patient Instructions (Addendum)
BEFORE YOU LEAVE: Wendie Simmer, set reminder to call her in 1 week to ensure colonoscopy scheduled -[facility] -follow up: as scheduled   Please call today and schedule colonoscopy.  [facility] - can be tough to get in so please call now to get the ball rolling.  Elevate, compression if at all possible.  Increase lasix to 3 tablets daily for 3 days when increased swelling.    We recommend the following healthy lifestyle for LIFE: 1) Small portions. But, make sure to get regular (at least 3 per day), healthy meals and small healthy snacks if needed.  2) Eat a healthy clean diet.   TRY TO EAT: -at least 5-7 servings of low sugar, colorful, and nutrient rich vegetables per day (not corn, potatoes or bananas.) -berries are the best choice if you wish to eat fruit (only eat small amounts if trying to reduce weight)  -lean meets (fish, white meat of chicken or [place]) -vegan proteins for some meals - beans or tofu, whole grains, nuts and seeds -Replace bad fats with good fats - good fats include: fish, nuts and seeds, canola oil, olive oil -small amounts of low fat or non fat dairy -small amounts of100 % whole grains - check the lables -drink plenty of water  AVOID: -SUGAR, sweets, anything with added sugar, corn syrup or sweeteners - must read labels as even foods advertised as "healthy" often are loaded with sugar -if you must have a sweetener, small amounts of stevia may be best -sweetened beverages and artificially sweetened beverages -simple starches (rice, bread, potatoes, pasta, chips, etc - small amounts of 100% whole grains are ok) -red meat, pork, butter -fried foods, fast food, processed food, excessive dairy, eggs and coconut.  3)Get at least 150 minutes of sweaty aerobic exercise per week.  4)Reduce stress - consider counseling, meditation and relaxation to balance other aspects of your life.

## 2018-07-27 ENCOUNTER — Other Ambulatory Visit: Payer: Self-pay

## 2018-07-27 ENCOUNTER — Ambulatory Visit: Payer: 59 | Admitting: Family Medicine

## 2018-07-27 ENCOUNTER — Encounter: Payer: Self-pay | Admitting: Family Medicine

## 2018-07-27 VITALS — BP 106/78 | HR 69 | Temp 98.4°F | Ht 66.0 in | Wt 242.2 lb

## 2018-07-27 DIAGNOSIS — I7381 Erythromelalgia: Secondary | ICD-10-CM

## 2018-07-27 DIAGNOSIS — I872 Venous insufficiency (chronic) (peripheral): Secondary | ICD-10-CM

## 2018-07-27 DIAGNOSIS — R600 Localized edema: Secondary | ICD-10-CM

## 2018-07-27 LAB — CBC WITH DIFFERENTIAL/PLATELET
BASOS ABS: 0 10*3/uL (ref 0.0–0.1)
BASOS PCT: 0.8 % (ref 0.0–3.0)
EOS ABS: 0.1 10*3/uL (ref 0.0–0.7)
Eosinophils Relative: 1.6 % (ref 0.0–5.0)
HEMATOCRIT: 43.3 % (ref 36.0–46.0)
Hemoglobin: 14.7 g/dL (ref 12.0–15.0)
LYMPHS PCT: 37.6 % (ref 12.0–46.0)
Lymphs Abs: 1.8 10*3/uL (ref 0.7–4.0)
MCHC: 33.9 g/dL (ref 30.0–36.0)
MCV: 86 fl (ref 78.0–100.0)
Monocytes Absolute: 0.5 10*3/uL (ref 0.1–1.0)
Monocytes Relative: 10.8 % (ref 3.0–12.0)
NEUTROS ABS: 2.4 10*3/uL (ref 1.4–7.7)
Neutrophils Relative %: 49.2 % (ref 43.0–77.0)
PLATELETS: 178 10*3/uL (ref 150.0–400.0)
RBC: 5.04 Mil/uL (ref 3.87–5.11)
RDW: 14.1 % (ref 11.5–15.5)
WBC: 4.8 10*3/uL (ref 4.0–10.5)

## 2018-07-27 MED ORDER — BETAMETHASONE DIPROPIONATE 0.05 % EX CREA: TOPICAL_CREAM | Freq: Two times a day (BID) | CUTANEOUS | 0 refills | Status: DC

## 2018-07-27 MED FILL — BETAMETHASONE DP 0.05% CRM: 0.05 | 30 days supply | Qty: 30 | Fill #0

## 2018-07-27 NOTE — Patient Instructions (Signed)
Please schedule a follow up with Dr. Maudie Mercury in the next 2-3 days for recheck.  Elevate your leg and use the steroid cream twice a day.  Increase lasix to 60mg  starting today for 3 days. Weigh yourself daily.   Call immediately for pain or fever.   If you have any questions or concerns, please don't hesitate to send me a message via MyChart or call the office at [phone]. Thank you for visiting with [name] today! It's our pleasure caring for you.   Chronic Venous Insufficiency Chronic venous insufficiency, also called venous stasis, is a condition that prevents blood from being pumped effectively through the veins in your legs. Blood may no longer be pumped effectively from the legs back to the heart. This condition can range from mild to severe. With proper treatment, you should be able to continue with an active life. What are the causes? Chronic venous insufficiency occurs when the vein walls become stretched, weakened, or damaged, or when valves within the vein are damaged. Some common causes of this include:  High blood pressure inside the veins (venous hypertension).  Increased blood pressure in the leg veins from long periods of sitting or standing.  A blood clot that blocks blood flow in a vein (deep vein thrombosis, DVT).  Inflammation of a vein (phlebitis) that causes a blood clot to form.  Tumors in the pelvis that cause blood to back up.  What increases the risk? The following factors may make you more likely to develop this condition:  Having a family history of this condition.  Obesity.  Pregnancy.  Living without enough physical activity or exercise (sedentary lifestyle).  Smoking.  Having a job that requires long periods of standing or sitting in one place.  Being a certain age. Women in their 39s and 73s and men in their 64s are more likely to develop this condition.  What are the signs or symptoms? Symptoms of this condition include:  Veins that are enlarged,  bulging, or twisted (varicose veins).  Skin breakdown or ulcers.  Reddened or discolored skin on the front of the leg.  Brown, smooth, tight, and painful skin just above the ankle, usually on the inside of the leg (lipodermatosclerosis).  Swelling.  How is this diagnosed? This condition may be diagnosed based on:  Your medical history.  A physical exam.  Tests, such as: ? A procedure that creates an image of a blood vessel and nearby organs and provides information about blood flow through the blood vessel (duplex ultrasound). ? A procedure that tests blood flow (plethysmography). ? A procedure to look at the veins using X-ray and dye (venogram).  How is this treated? The goals of treatment are to help you return to an active life and to minimize pain or disability. Treatment depends on the severity of your condition, and it may include:  Wearing compression stockings. These can help relieve symptoms and help prevent your condition from getting worse. However, they do not cure the condition.  Sclerotherapy. This is a procedure involving an injection of a material that "dissolves" damaged veins.  Surgery. This may involve: ? Removing a diseased vein (vein stripping). ? Cutting off blood flow through the vein (laser ablation surgery). ? Repairing a valve.  Follow these instructions at home:  Wear compression stockings as told by your health care provider. These stockings help to prevent blood clots and reduce swelling in your legs.  Take over-the-counter and prescription medicines only as told by your health care provider.  Stay active by exercising, walking, or doing different activities. Ask your health care provider what activities are safe for you and how much exercise you need.  Drink enough fluid to keep your urine clear or pale yellow.  Do not use any products that contain nicotine or tobacco, such as cigarettes and e-cigarettes. If you need help quitting, ask your  health care provider.  Keep all follow-up visits as told by your health care provider. This is important. Contact a health care provider if:  You have redness, swelling, or more pain in the affected area.  You see a red streak or line that extends up or down from the affected area.  You have skin breakdown or a loss of skin in the affected area, even if the breakdown is small.  You get an injury in the affected area. Get help right away if:  You get an injury and an open wound in the affected area.  You have severe pain that does not get better with medicine.  You have sudden numbness or weakness in the foot or ankle below the affected area, or you have trouble moving your foot or ankle.  You have a fever and you have worse or persistent symptoms.  You have chest pain.  You have shortness of breath. Summary  Chronic venous insufficiency, also called venous stasis, is a condition that prevents blood from being pumped effectively through the veins in your legs.  Chronic venous insufficiency occurs when the vein walls become stretched, weakened, or damaged, or when valves within the vein are damaged.  Treatment for this condition depends on how severe your condition is, and it may involve wearing compression stockings or having a procedure.  Make sure you stay active by exercising, walking, or doing different activities. Ask your health care provider what activities are safe for you and how much exercise you need. This information is not intended to replace advice given to you by your health care provider. Make sure you discuss any questions you have with your health care provider. Document Released: 12/09/2006 Document Revised: 06/24/2016 Document Reviewed: 06/24/2016 Elsevier Interactive Patient Education  2017 [employer].

## 2018-07-27 NOTE — Progress Notes (Signed)
Subjective  CC:  Chief Complaint  Patient presents with  . Leg Pain    redness and has heat in  it, has had leg infection. Been off antibiotics for 2 weeks     HPI: [name] is a 67 y.o. female who presents to the office today to address the problems listed above in the chief complaint. Same day acute visit; PCP not available. New pt to me. Chart reviewed.    67 year old female patient presents for lower extremity redness in the right anterior leg that started this morning.  November 10, she was seen in the emergency room for lower extremity redness and treated for possible cellulitis.  Doppler was negative for DVT at that time.  I reviewed the chart and her follow-up.  She was treated with 1 dose of IV clindamycin and then Keflex.  She was seen in follow-up the week after and given another course of antibiotics since the redness and not completely resolved.  Reportedly she had 1 fever the day prior to the ED visit but none since.  White blood counts have been normal.  Blood cultures were negative.  She carries a diagnosis of chronic lower extremity edema and erythromelalgia.  She denies pain, calf pain, fever or malaise.  Assessment  1. 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 Erythromelalgia ([place])   3. Lower extremity edema -chronic      Plan   Lower extremity edema and redness: Suspect this could be active venous stasis dermatitis.  We discussed this possibility.  Unlikely to have recurrent cellulitis after 2 rounds of antibiotics but will monitor closely.  No antibiotics today.  Instead, steroid cream twice a day and leg elevation.  Increase Lasix to 60 mg daily for the next 2 days and then follow-up with PCP for recheck.  Call immediately for fevers or increasing hot redness of the leg.  Patient agrees.  Follow up: Return in about 2 days (around 07/29/2018) for recheck with Dr. Maudie Mercury.   Orders Placed This Encounter  Procedures  . CBC with Differential/Platelet    Meds ordered this encounter  Medications  . betamethasone dipropionate (DIPROLENE) 0.05 % cream    Sig: Apply topically 2 (two) times daily.    Dispense:  30 g    Refill:  0      I reviewed the patients updated PMH, FH, and SocHx.    Patient Active Problem List   Diagnosis Date Noted  . Morbid obesity ([place]) 05/12/2018  . Hyperglycemia 05/12/2018  . Lower extremity edema -chronic 05/12/2018  . Iron deficiency anemia 05/12/2018  . Erythromelalgia ([place]) 10/17/2017  . Hyperparathyroidism ([place]) 05/06/2017  . Lumbar stenosis with neurogenic claudication 10/11/2016  . H/O breast augmentation 09/12/2016  . L-S radiculopathy 05/30/2016  . Neck mass 05/30/2015  . Hx of colonic polyp - ssp 11/03/2014  . Neuropathy 07/06/2014  . Hyperlipidemia 06/02/2014  . Anxiety state 06/02/2014  . Allergic rhinitis   . Osteoporosis 10/17/2011  . Rheumatoid arthritis ([place])   . Hypertension    Current Meds  Medication Sig  . aspirin 81 MG tablet Take 81 mg by mouth at bedtime.   Candee Furbish Paint Modified 1.5 % LIQD Paint between toes after foot soaks up to 3 times per day.  . Cholecalciferol (VITAMIN D-3) 5000 units TABS Take 1 tablet by mouth daily.  . furosemide (LASIX) 20 MG tablet Take 2 tablets (40 mg total) by mouth daily.  Marland Kitchen gabapentin (NEURONTIN) 300 MG capsule Take  1 capsule (300 mg total) by mouth 2 (two) times daily.  . hydroxychloroquine (PLAQUENIL) 200 MG tablet Take 200 mg by mouth daily. Patient is currently taking  . IRON PO Take by mouth.  . losartan (COZAAR) 100 MG tablet TAKE 1 TABLET BY MOUTH ONCE DAILY  . metoprolol succinate (TOPROL-XL) 50 MG 24 hr tablet TAKE 2 TABLETS BY MOUTH DAILY. TAKE WITH OR IMMEDIATELY FOLLOWING A MEAL  . ranitidine (ZANTAC) 150 MG tablet Take 150 mg by mouth daily as needed for heartburn.   . senna-docusate (SENOKOT-S) 8.6-50 MG tablet Take 1 tablet by mouth 2 (two) times daily.  Marland Kitchen sulfaSALAzine (AZULFIDINE) 500 MG EC tablet TAKE 2 TABLETS BY  MOUTH TWICE DAILY  . tiZANidine (ZANAFLEX) 4 MG tablet Take 0.5-1 tablets (2-4 mg total) by mouth every 8 (eight) hours as needed for muscle spasms.  . Vit B6-Vit B12-Omega 3 Acids (VITAMIN B PLUS+ PO) Take 1 tablet by mouth every evening.  . [DISCONTINUED] neomycin-polymyxin-hydrocortisone (CORTISPORIN) 3.5-10000-1 OTIC suspension   . [DISCONTINUED] predniSONE (DELTASONE) 5 MG tablet     Allergies: Patient is allergic to amlodipine besylate; nortriptyline; and dilaudid [hydromorphone hcl]. Family History: Patient family history includes Heart attack in her father; Hypertension in her brother, father, and sister; Hyperthyroidism in her sister; Hypothyroidism in her brother; Stroke in her mother. Social History:  Patient  reports that she quit smoking about 34 years ago. Her smoking use included cigarettes. She has a 16.00 pack-year smoking history. She quit smokeless tobacco use about 36 years ago. She reports that she does not drink alcohol or use drugs.  Review of Systems: Constitutional: Negative for fever malaise or anorexia Cardiovascular: negative for chest pain Respiratory: negative for SOB or persistent cough Gastrointestinal: negative for abdominal pain  Objective  Vitals: BP 106/78   Pulse 69   Temp 98.4 F (36.9 C)   Ht 5\' 6"  (1.676 m)   Wt 242 lb 3.2 oz (109.9 kg)   SpO2 96%   BMI 39.09 kg/m  General: no acute distress , A&Ox3 HEENT: PEERL, conjunctiva normal, Oropharynx moist,neck is supple Cardiovascular:  RRR without murmur or gallop.  Respiratory:  Good breath sounds bilaterally, CTAB with normal respiratory effort Skin:  Warm, bilateral feet are red and cool, right lower extremity to mid shin is red and mildly warm with +2 pitting edema bilaterally.  No calf tenderness or cords palpated.     Commons side effects, risks, benefits, and alternatives for medications and treatment plan prescribed today were discussed, and the patient expressed understanding of the  given instructions. Patient is instructed to call or message via MyChart if he/she has any questions or concerns regarding our treatment plan. No barriers to understanding were identified. We discussed Red Flag symptoms and signs in detail. Patient expressed understanding regarding what to do in case of urgent or emergency type symptoms.   Medication list was reconciled, printed and provided to the patient in AVS. Patient instructions and summary information was reviewed with the patient as documented in the AVS. This note was prepared with assistance of Dragon voice recognition software. Occasional wrong-word or sound-a-like substitutions may have occurred due to the inherent limitations of voice recognition software

## 2018-07-28 ENCOUNTER — Telehealth: Payer: Self-pay | Admitting: Family Medicine

## 2018-07-28 NOTE — Telephone Encounter (Signed)
Copied from [place] [id number]. Topic: Quick Communication - See Telephone Encounter >> Jul 28, 2018  8:44 AM Antonieta Iba C wrote: CRM for notification. See Telephone encounter for: 07/28/18.   Pt was seen at [facility] and advised to follow up with PCP in 3 days. Not showing any openings at that time. Please assist pt with scheduling.   CB [phone]

## 2018-07-28 NOTE — Telephone Encounter (Signed)
I called the pt and advised she come in on Thursday 12/12 at 2pm.  Message sent to [facility] to unblock the slot as it was for a same day appt.

## 2018-07-30 ENCOUNTER — Encounter: Payer: Self-pay | Admitting: Internal Medicine

## 2018-07-30 ENCOUNTER — Encounter: Payer: Self-pay | Admitting: Family Medicine

## 2018-07-30 ENCOUNTER — Ambulatory Visit: Payer: 59 | Admitting: Internal Medicine

## 2018-07-30 ENCOUNTER — Ambulatory Visit: Payer: 59 | Admitting: Family Medicine

## 2018-07-30 VITALS — BP 102/74 | HR 81 | Temp 98.6°F | Ht 66.0 in | Wt 242.7 lb

## 2018-07-30 VITALS — BP 128/80 | HR 80 | Ht 66.0 in | Wt 243.0 lb

## 2018-07-30 DIAGNOSIS — M810 Age-related osteoporosis without current pathological fracture: Secondary | ICD-10-CM | POA: Diagnosis not present

## 2018-07-30 DIAGNOSIS — E213 Hyperparathyroidism, unspecified: Secondary | ICD-10-CM

## 2018-07-30 DIAGNOSIS — I878 Other specified disorders of veins: Secondary | ICD-10-CM

## 2018-07-30 LAB — MAGNESIUM: MAGNESIUM: 1.9 mg/dL (ref 1.5–2.5)

## 2018-07-30 LAB — VITAMIN D 25 HYDROXY (VIT D DEFICIENCY, FRACTURES): VITD: 43.86 ng/mL (ref 30.00–100.00)

## 2018-07-30 LAB — PHOSPHORUS: Phosphorus: 2.3 mg/dL (ref 2.3–4.6)

## 2018-07-30 MED ORDER — FUROSEMIDE 20 MG PO TABS: 60.0000 mg | ORAL_TABLET | Freq: Every day | ORAL | 1 refills | Status: DC

## 2018-07-30 NOTE — Progress Notes (Signed)
HPI:  Using dictation device. Unfortunately this device frequently misinterprets words/phrases.  Follow up LE edema: -chronic issues with edema, erythema, pain LEs - has seen a number of specialists -increased visits for this recently with several courses of abx for ? Cellulitis -seen 3 days ago by colleague for the same - treated with increased dose lsix for a few days and topical steroid cream - reports: doing much better, increase lasix from 40mg  daily to 60mg  daily for 2 days and it really helped; did not use the steroid cream because it burned her skin -denies:fevers, malaise, increased swelling, redness or pain -unable to tolerate compression  ROS: See pertinent positives and negatives per HPI.  Past Medical History:  Diagnosis Date  . Allergy   . Cataract    BILATERAL-REMOVED 2 YEARS AGO  . GERD (gastroesophageal reflux disease)   . Hx of colonic polyp - ssp 11/03/2014  . Hypercalcemia   . Hypertension   . Left maxillary fracture ([place]) 07/17/2017  . 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 Osteopenia 10/17/2011   DEXA 09/2007: -1.4 L fem; 10/2011: -1.2 L fem   . PONV (postoperative nausea and vomiting)   . Pseudogout of foot   . Rheumatoid arthritis(714.0) dx 2010  . Tibial plateau fracture, right, closed, initial encounter 07/16/2017    Past Surgical History:  Procedure Laterality Date  . BREAST BIOPSY  1972  . Broken wrist  2010  . CATARACT EXTRACTION  03/2012   left  . COLONOSCOPY    . COSMETIC SURGERY    . FRACTURE SURGERY    . INNER EAR SURGERY     busted ear drum  . MAXIMUM ACCESS (MAS)POSTERIOR LUMBAR INTERBODY FUSION (PLIF) 1 LEVEL N/A 10/11/2016   Procedure: Lumbar one-Sacral one Maximum access posterior lumbar interbody fusion;  Surgeon: Erline Levine, MD;  Location: [facility];  Service: Neurosurgery;  Laterality: N/A;  . ORIF WRIST FRACTURE Right 07/18/2017   Procedure: OPEN REDUCTION INTERNAL FIXATION (ORIF) WRIST FRACTURE;  Surgeon: Renette Butters, MD;   Location: [facility];  Service: Orthopedics;  Laterality: Right;  . pneumonia  2007    Family History  Problem Relation Age of Onset  . Stroke Mother   . Hypertension Father   . Heart attack Father   . Hypertension Sister        x 3  . Hypertension Brother   . Hyperthyroidism Sister        x2, s/p RAI ablation  . Hypothyroidism Brother     SOCIAL HX: se ehpi   Current Outpatient Medications:  .  aspirin 81 MG tablet, Take 81 mg by mouth at bedtime. , Disp: , Rfl:  .  Castellani Paint Modified 1.5 % LIQD, Paint between toes after foot soaks up to 3 times per day., Disp: 1 Bottle, Rfl: 2 .  Cholecalciferol (VITAMIN D-3) 5000 units TABS, Take 1 tablet by mouth daily., Disp: , Rfl:  .  furosemide (LASIX) 20 MG tablet, Take 3 tablets (60 mg total) by mouth daily., Disp: 180 tablet, Rfl: 1 .  gabapentin (NEURONTIN) 300 MG capsule, Take 1 capsule (300 mg total) by mouth 2 (two) times daily., Disp: 180 capsule, Rfl: 3 .  hydroxychloroquine (PLAQUENIL) 200 MG tablet, Take 200 mg by mouth daily. Patient is currently taking, Disp: , Rfl:  .  IRON PO, Take by mouth., Disp: , Rfl:  .  losartan (COZAAR) 100 MG tablet, TAKE 1 TABLET BY MOUTH ONCE DAILY, Disp: 90 tablet, Rfl: 1 .  metoprolol succinate (TOPROL-XL)  50 MG 24 hr tablet, TAKE 2 TABLETS BY MOUTH DAILY. TAKE WITH OR IMMEDIATELY FOLLOWING A MEAL, Disp: 180 tablet, Rfl: 1 .  ranitidine (ZANTAC) 150 MG tablet, Take 150 mg by mouth daily as needed for heartburn. , Disp: , Rfl:  .  senna-docusate (SENOKOT-S) 8.6-50 MG tablet, Take 1 tablet by mouth 2 (two) times daily., Disp: , Rfl:  .  sulfaSALAzine (AZULFIDINE) 500 MG EC tablet, TAKE 2 TABLETS BY MOUTH TWICE DAILY, Disp: 120 tablet, Rfl: 0 .  tiZANidine (ZANAFLEX) 4 MG tablet, Take 0.5-1 tablets (2-4 mg total) by mouth every 8 (eight) hours as needed for muscle spasms., Disp: 20 tablet, Rfl: 0 .  Vit B6-Vit B12-Omega 3 Acids (VITAMIN B PLUS+ PO), Take 1 tablet by mouth every evening., Disp: ,  Rfl:   EXAM:  Vitals:   07/30/18 1414  BP: 102/74  Pulse: 81  Temp: 98.6 F (37 C)  SpO2: 96%    Body mass index is 39.17 kg/m.  GENERAL: vitals reviewed and listed above, alert, oriented, appears well hydrated and in no acute distress  HEENT: atraumatic, conjunttiva clear, no obvious abnormalities on inspection of external nose and ears  NECK: no obvious masses on inspection  LUNGS: clear to auscultation bilaterally, no wheezes, rales or rhonchi, good air movement  CV: HRRR, tx le edema  SKIM: mild erythema without warmth or induration lower legs/feet bilat  MS: moves all extremities without noticeable abnormality  PSYCH: pleasant and cooperative, no obvious depression or anxiety  ASSESSMENT AND PLAN:  Discussed the following assessment and plan:  Venous stasis  -revisit compression now that swelling improved and discussed methods for application - she admits today the main reason she did not use is she does not like how hot it makes her feet - may try toeless version -elevation -prn increased dose of lasix for a few days if increased swelling -follow up with vascular if worsening or further concerns -follow up here 3 months -reports colonoscopy is scheduled for January -Patient advised to return or notify a doctor immediately if symptoms worsen or persist or new concerns arise.  Patient Instructions  BEFORE YOU LEAVE: -follow up: follow up in 3 months (cancel January follow up)  Wear compression daily and remove at night  Use 60mg  lasix as needed for 3-5 days when you have increased swelling  Follow up with the Vascular specialist if you have any further issues with your legs and swelling    Christine Kern, DO

## 2018-07-30 NOTE — Patient Instructions (Signed)
BEFORE YOU LEAVE: -follow up: follow up in 3 months (cancel January follow up)  Wear compression daily and remove at night  Use 60mg  lasix as needed for 3-5 days when you have increased swelling  Follow up with the Vascular specialist if you have any further issues with your legs and swelling

## 2018-07-30 NOTE — Patient Instructions (Signed)
Please stop at the lab.  Please start a 24h urine collection:  Patient information (Up-to-Date): Collection of a 24-hour urine specimen  - You should collect every drop of urine during each 24-hour period. It does not matter how much or little urine is passed each time, as long as every drop is collected. - Begin the urine collection in the morning after you wake up, after you have emptied your bladder for the first time. - Urinate (empty the bladder) for the first time and flush it down the toilet. Note the exact time (eg, 6:15 AM). You will begin the urine collection at this time. - Collect every drop of urine during the day and night in an empty collection bottle. Store the bottle at room temperature or in the refrigerator. - If you need to have a bowel movement, any urine passed with the bowel movement should be collected. Try not to include feces with the urine collection. If feces does get mixed in, do not try to remove the feces from the urine collection bottle. - Finish by collecting the first urine passed the next morning, adding it to the collection bottle. This should be within ten minutes before or after the time of the first morning void on the first day (which was flushed). In this example, you would try to void between 6:05 and 6:25 on the second day. - If you need to urinate one hour before the final collection time, drink a full glass of water so that you can void again at the appropriate time. If you have to urinate 20 minutes before, try to hold the urine until the proper time. - Please note the exact time of the final collection, even if it is not the same time as when collection began on day 1. - The bottle(s) may be kept at room temperature for a day or two, but should be kept cool or refrigerated for longer periods of time.  Please come back for a follow-up appointment in 6 months.

## 2018-07-30 NOTE — Progress Notes (Addendum)
Patient ID: [name], female   DOB: 04-03-1951, 67 y.o.   MRN: [id number]    HPI  [name] is a 67 y.o.-year-old female, initially referred by her PCP, Dr. Maudie Mercury, returning for follow-up for hypercalcemia/hyperparathyroidism, osteoporosis, vitamin D deficiency.  Last visit 7 months ago. She is the wife of Karsten Vaughn.  Pt has had hypercalcemia at least since 2014.  I reviewed pertinent labs-PTH was high at last check: Lab Results  Component Value Date   PTH 66 (H) 04/03/2017   CALCIUM 10.0 06/28/2018   CALCIUM 10.7 (H) 06/04/2018   CALCIUM 10.5 01/13/2018   CALCIUM 9.2 07/20/2017   CALCIUM 9.6 07/19/2017   CALCIUM 9.5 07/18/2017   CALCIUM 9.5 07/18/2017   CALCIUM 10.0 07/17/2017   CALCIUM 10.5 07/04/2017   CALCIUM 10.4 04/03/2017   She takes biotin in B complex-for peripheral neuropathy and erythromelalgia. Last dose was 2 days ago.  Osteoporosis:  Reviewed her DXA scan reports: 01/10/2018 ([place]) Lumbar spine L1-L3 (L4) Femoral neck (FN) 33% distal radius  T-score -1.7 RFN: -1.4 LFN: -1.8 Could not be analyzed due to bilateral hardware  Change in BMD from previous DXA test (%) -8.5%* -7.9%* n/a  (*) statistically significant   Date L1-L4 T score FN T score  10/23/2011 ([facility]) -1.0 LFN: -1.2 RFN: -1.0  10/05/2007 -1.3 (-15.4%*) LFN: -1.4   She had previous fractures:  - L wrist fx in 2005 (distal radius and small ulnar styloid avulsion fx). - Right tibial plateau fracture 06/2017 (fell down the stairs) - Right forearm 06/2017 (fell down the stairs) - Left maxillary fracture 06/2017 (fell down the stairs) - Right radial fracture 06/2017 (fell down the stairs)  No history of kidney stones.  No CKD. Last BUN/Cr: Lab Results  Component Value Date   BUN 11 06/28/2018   BUN 16 06/04/2018   CREATININE 0.86 06/28/2018   CREATININE 0.87 06/04/2018   She was on HCTZ in the past, but not recently. Now on Lasix, dose increased recently.  Vitamin D  deficiency:  Patient had a very low vitamin D level in 04/2017, but this normalized on 5000 units vitamin D daily:  Lab Results  Component Value Date   VD25OH 42.24 03/04/2018   VD25OH 14.17 (L) 05/05/2017   VD25OH 51 05/14/2013   She denies FH of hypercalcemia, pituitary tumors, thyroid cancer, or osteoporosis.   She has FH of thyroid ds in 2 sisters and 1 brother.   Her older son and younger sister had kidney stones.  Pt. also has a history of RA-on Plaquenil Has HL - on Red Yeast Rice.  ROS: Constitutional: + weight gain/no weight loss, no fatigue, no subjective hyperthermia, no subjective hypothermia Eyes: no blurry vision, no xerophthalmia ENT: no sore throat, no nodules palpated in neck, no dysphagia, no odynophagia, no hoarseness Cardiovascular: no CP/no SOB/no palpitations/no leg swelling Respiratory: no cough/no SOB/no wheezing Gastrointestinal: no N/no V/no D/no C/no acid reflux Musculoskeletal: no muscle aches/no joint aches Skin: no rashes, no hair loss Neurological: no tremors/no numbness/no tingling/no dizziness  I reviewed pt's medications, allergies, PMH, social hx, family hx, and changes were documented in the history of present illness. Otherwise, unchanged from my initial visit note.  Past Medical History:  Diagnosis Date  . Allergy   . Cataract    BILATERAL-REMOVED 2 YEARS AGO  . GERD (gastroesophageal reflux disease)   . Hx of colonic polyp - ssp 11/03/2014  . Hypercalcemia   . Hypertension   . Left maxillary fracture ([place])  07/17/2017  . 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 Osteopenia 10/17/2011   DEXA 09/2007: -1.4 L fem; 10/2011: -1.2 L fem   . PONV (postoperative nausea and vomiting)   . Pseudogout of foot   . Rheumatoid arthritis(714.0) dx 2010  . Tibial plateau fracture, right, closed, initial encounter 07/16/2017   Past Surgical History:  Procedure Laterality Date  . BREAST BIOPSY  1972  . Broken wrist  2010  . CATARACT EXTRACTION  03/2012    left  . COLONOSCOPY    . COSMETIC SURGERY    . FRACTURE SURGERY    . INNER EAR SURGERY     busted ear drum  . MAXIMUM ACCESS (MAS)POSTERIOR LUMBAR INTERBODY FUSION (PLIF) 1 LEVEL N/A 10/11/2016   Procedure: Lumbar one-Sacral one Maximum access posterior lumbar interbody fusion;  Surgeon: Erline Levine, MD;  Location: [facility];  Service: Neurosurgery;  Laterality: N/A;  . ORIF WRIST FRACTURE Right 07/18/2017   Procedure: OPEN REDUCTION INTERNAL FIXATION (ORIF) WRIST FRACTURE;  Surgeon: Renette Butters, MD;  Location: [facility];  Service: Orthopedics;  Laterality: Right;  . pneumonia  2007   Social History   Social History  . Marital status: Married    Spouse name: N/A  . Number of children: 3  . Years of education: N/A   Occupational History  . Retired    Social History Main Topics  . Smoking status: Former Smoker    Types: Cigarettes  . Smokeless tobacco: Former Systems developer    Quit date: 08/19/1981  . Alcohol use No  . Drug use: No   Social History Narrative   Artist -retired Building control surveyor   Married, lives with spouse, [name], he is IT support for [employer]   3 sons   2 caffeinated beverages a day   No regular exercise, diet is ok   Current Outpatient Medications on File Prior to Visit  Medication Sig Dispense Refill  . aspirin 81 MG tablet Take 81 mg by mouth at bedtime.     . betamethasone dipropionate (DIPROLENE) 0.05 % cream Apply topically 2 (two) times daily. 30 g 0  . Castellani Paint Modified 1.5 % LIQD Paint between toes after foot soaks up to 3 times per day. 1 Bottle 2  . Cholecalciferol (VITAMIN D-3) 5000 units TABS Take 1 tablet by mouth daily.    . furosemide (LASIX) 20 MG tablet Take 2 tablets (40 mg total) by mouth daily. 180 tablet 1  . gabapentin (NEURONTIN) 300 MG capsule Take 1 capsule (300 mg total) by mouth 2 (two) times daily. 180 capsule 3  . hydroxychloroquine (PLAQUENIL) 200 MG tablet Take 200 mg by mouth daily. Patient is currently taking     . IRON PO Take by mouth.    . losartan (COZAAR) 100 MG tablet TAKE 1 TABLET BY MOUTH ONCE DAILY 90 tablet 1  . metoprolol succinate (TOPROL-XL) 50 MG 24 hr tablet TAKE 2 TABLETS BY MOUTH DAILY. TAKE WITH OR IMMEDIATELY FOLLOWING A MEAL 180 tablet 1  . ranitidine (ZANTAC) 150 MG tablet Take 150 mg by mouth daily as needed for heartburn.     . senna-docusate (SENOKOT-S) 8.6-50 MG tablet Take 1 tablet by mouth 2 (two) times daily.    Marland Kitchen sulfaSALAzine (AZULFIDINE) 500 MG EC tablet TAKE 2 TABLETS BY MOUTH TWICE DAILY 120 tablet 0  . tiZANidine (ZANAFLEX) 4 MG tablet Take 0.5-1 tablets (2-4 mg total) by mouth every 8 (eight) hours as needed for muscle spasms. 20 tablet 0  .  Vit B6-Vit B12-Omega 3 Acids (VITAMIN B PLUS+ PO) Take 1 tablet by mouth every evening.     No current facility-administered medications on file prior to visit.    Allergies  Allergen Reactions  . Amlodipine Besylate Other (See Comments)    Tremors  . Nortriptyline Other (See Comments)    tremors  . Dilaudid [Hydromorphone Hcl]     Headache, muscle tightness   Family History  Problem Relation Age of Onset  . Stroke Mother   . Hypertension Father   . Heart attack Father   . Hypertension Sister        x 3  . Hypertension Brother   . Hyperthyroidism Sister        x2, s/p RAI ablation  . Hypothyroidism Brother     PE: BP 128/80   Pulse 80   Ht 5\' 6"  (1.676 m) Comment: measured  Wt 243 lb (110.2 kg)   SpO2 98%   BMI 39.22 kg/m  Wt Readings from Last 3 Encounters:  07/30/18 243 lb (110.2 kg)  07/27/18 242 lb 3.2 oz (109.9 kg)  07/13/18 241 lb (109.3 kg)   Constitutional: overweight, in NAD Eyes: PERRLA, EOMI, no exophthalmos ENT: moist mucous membranes, no thyromegaly, no cervical lymphadenopathy Cardiovascular: RRR, No MRG, + LE edema B Respiratory: CTA B Gastrointestinal: abdomen soft, NT, ND, BS+ Musculoskeletal: no deformities, strength intact in all 4 Skin: moist, warm, no rashes Neurological: +  mild tremor with outstretched hands, DTR normal in all 4  Assessment: 1. Hypercalcemia/hyperparathyroidism  2.  Osteopenia + fragility fractures = osteoporosis  3.  Vitamin D deficiency  Plan: Patient has had several instances of elevated calcium with the highest level being at 10.7, but this normalized after stopping the calcium supplement and stopping HCTZ.  An intact PTH was high at last check, at 38, however, a vitamin D was very low at that time.  We discussed about improving her vitamin D status and then reassess her parathyroid condition.  Her vitamin D level normalized last summer.  We will check the following labs now: calcium level intact PTH ([facility]) Magnesium Phosphorus vitamin D 1,25 HO We may need a 24h urinary calcium/creatinine ratio - discussed instructions for urine collection, given jug - Of note, she does not have a history of nephrolithiasis but does have a history of fractures and osteopenia.  No abdominal pain, depression -We again discussed about possible consequences of hyperparathyroidism: ~1/3 pts will develop complications over 15 years; osteoporosis, nephrolithiasis -If the tests remain abnormal and indicate a parathyroid adenoma, she does agree with a referral to surgery -Reviewed criteria for parathyroid surgery: Increased calcium by more than 1 mg/dL above the upper limit of normal  Kidney ds.  Osteoporosis (or Vb fx) Age <57 years old New (2013): High UCa >400 mg/d and increased stone risk by biochemical stone risk analysis Presence of nephrolithiasis or nephrocalcinosis Pt's preference!  -I will see the patient back in 6 months  2.  Osteoporosis -She had osteopenia on bone density scan, however, due to her many fragility fractures, she has a clinical diagnosis of osteoporosis.  Most recent fracture was a right tibial fracture in 06/2017 -We reviewed together her latest bone density scan from earlier this year: Worse, but still in the osteopenic  range -For now, we will try to treat her for her hyperparathyroidism and see how her bone density will improve after this.  3.  Vitamin D deficiency - pt's vitamin D was very low in 04/2017 so I  advised her to start 5000 units vitamin D daily -Repeat vitamin D level was normal on 03/04/2018. -We will continue with the current dose of vitamin D  Component     Latest Ref Rng & Units 07/30/2018          Glucose     65 - 99 mg/dL 110 (H)  BUN     7 - 25 mg/dL 13  Creatinine     0.50 - 0.99 mg/dL 1.00 (H)  GFR, Est Non African American     > OR = 60 mL/min/1.20m2 57 (L)  GFR, Est African American     > OR = 60 mL/min/1.61m2 67  BUN/Creatinine Ratio     6 - 22 (calc) 13  Sodium     135 - 146 mmol/L 141  Potassium     3.5 - 5.3 mmol/L 4.1  Chloride     98 - 110 mmol/L 102  CO2     20 - 32 mmol/L 30  Calcium     8.6 - 10.4 mg/dL 10.9 (H)  Vitamin D 1, 25 (OH) Total     18 - 72 pg/mL 69  Vitamin D3 1, 25 (OH)     pg/mL 69  Vitamin D2 1, 25 (OH)     pg/mL <8  PTH, Intact     15 - 65 pg/mL 69 (H)  VITD     30.00 - 100.00 ng/mL 43.86  Magnesium     1.5 - 2.5 mg/dL 1.9  Phosphorus     2.3 - 4.6 mg/dL 2.3   High calcium and PTH with normal vitamin D.  Calcitriol at the upper limit of normal.  GFR slightly low, glucose slightly high.  My suspicion for primary hyperparathyroidism is high.  I do not feel we absolutely need a urine collection for now, as [place] is very unlikely with previous normal calcium levels, however, she does have the jug at home if needed in the future.  For now, I would like to refer her to surgery.  Philemon Kingdom, MD PhD [facility] Endocrinology

## 2018-07-31 LAB — PARATHYROID HORMONE, INTACT (NO CA): PTH: 69 pg/mL — ABNORMAL HIGH (ref 15–65)

## 2018-08-02 LAB — BASIC METABOLIC PANEL WITH GFR
BUN/Creatinine Ratio: 13 (calc) (ref 6–22)
BUN: 13 mg/dL (ref 7–25)
CALCIUM: 10.9 mg/dL — AB (ref 8.6–10.4)
CO2: 30 mmol/L (ref 20–32)
Chloride: 102 mmol/L (ref 98–110)
Creat: 1 mg/dL — ABNORMAL HIGH (ref 0.50–0.99)
GFR, EST AFRICAN AMERICAN: 67 mL/min/{1.73_m2} (ref >=60)
GFR, EST NON AFRICAN AMERICAN: 57 mL/min/{1.73_m2} — AB (ref >=60)
Glucose, Bld: 110 mg/dL — ABNORMAL HIGH (ref 65–99)
Potassium: 4.1 mmol/L (ref 3.5–5.3)
Sodium: 141 mmol/L (ref 135–146)

## 2018-08-02 LAB — VITAMIN D 1,25 DIHYDROXY
Vitamin D 1, 25 (OH)2 Total: 69 pg/mL (ref 18–72)
Vitamin D2 1, 25 (OH)2: <8 pg/mL
Vitamin D3 1, 25 (OH)2: 69 pg/mL

## 2018-08-03 MED FILL — sulfaSALAzine 500 MG TABS: 500 | 90 days supply | Qty: 360 | Fill #0

## 2018-08-14 ENCOUNTER — Encounter: Payer: Self-pay | Admitting: Neurology

## 2018-08-14 ENCOUNTER — Ambulatory Visit: Payer: 59 | Admitting: Neurology

## 2018-08-14 VITALS — BP 140/88 | HR 72 | Ht 66.0 in | Wt 243.2 lb

## 2018-08-14 DIAGNOSIS — I7381 Erythromelalgia: Secondary | ICD-10-CM

## 2018-08-14 DIAGNOSIS — G629 Polyneuropathy, unspecified: Secondary | ICD-10-CM

## 2018-08-14 NOTE — Patient Instructions (Signed)
Wishing you a happy and healthy New year!  Continue gabapentin 300mg  twice daily  Caution with driving, especially if the numbness of the feet limits your ability to safely manipulate pedals  Return to clinic in 9 months

## 2018-08-14 NOTE — Progress Notes (Signed)
Follow-up Visit   Date: 08/14/18    [name] MRN: [id number] DOB: [birth year]   Interim History: [name] is a 67 y.o. right-handed Caucasian female with hypertension, GERD, rheumatoid arthritis, erythromelalgia, status post L5-S1 lumbar fusion and decompression returning to the clinic for follow-up of neuropathy.  The patient was accompanied to the clinic by self.   History of present illness: Starting 2013, she developed redness and swelling of the toes and feet, which progressed into burning and firey sensation of the toes.  She also has numbness of the feet. Her toes can swell so much that it prohibits her from moving her toes at times.   She gets marked relief with soaking her feet in ice water.  Activity and heat exacerbates her symptoms.  She has previously seen cardiology who did not feel symptoms were vascular in origin.  She went to [facility] and started her on gabapentin 200mg  twice daily, which provides some relief.   She was unable to tolerate any higher dose due to sedation.   She was diagnosed with rheumatoid arthritis around the same time that her feet started to change color and cause pain.  She takes a daily aspirin, but does not appreciate that this improves her pain any.  She sees Dr. Amil Amen for RA and takes plaquenil and sulfasalazine.   She has long history of low back and bilateral leg pain worse on the right which was exacerbated in 2017 due to a slip. Around that time, she started having right leg shooting pain radiating from her back to her knee. MRI lumbar spine from August 2017 showed narrowing at left L3-4, mild arthritis at L4-5 left>right, severe arthritis at L5-S1 with L5 nerve impingement on the right.  Due to ongoing pain, she underwent decompression and fusion L5-S1 with significant improvement in back and leg pain  on 10/11/2016 by Dr. Vertell Limber and is very pleased with her results.  UPDATE 08/14/2018:  She is here for follow-up visit  and continues to have bilateral feet pain the the feet.  She is takes gabapentin 300mg  BID which eases some of her pain, unfortunately higher dose of gabapentin makes her sleepy.  She regularly carries ice packs to apply to the feet as this provides the greatest benefit.  Her PCP instructed her to increase lasix to 20mg  TID which has provided significant relief of ankle/feet swelling, which also reduced her pain.  On rare occasions when her pain/numbness is severe, she has had ato avoid driving because of not being able to control the pedals.    Medications:  Current Outpatient Medications on File Prior to Visit  Medication Sig Dispense Refill  . aspirin 81 MG tablet Take 81 mg by mouth at bedtime.     Candee Furbish Paint Modified 1.5 % LIQD Paint between toes after foot soaks up to 3 times per day. 1 Bottle 2  . Cholecalciferol (VITAMIN D-3) 5000 units TABS Take 1 tablet by mouth daily.    . furosemide (LASIX) 20 MG tablet Take 3 tablets (60 mg total) by mouth daily. 180 tablet 1  . gabapentin (NEURONTIN) 300 MG capsule Take 1 capsule (300 mg total) by mouth 2 (two) times daily. 180 capsule 3  . hydroxychloroquine (PLAQUENIL) 200 MG tablet Take 200 mg by mouth daily. Patient is currently taking    . IRON PO Take by mouth.    . losartan (COZAAR) 100 MG tablet TAKE 1 TABLET BY MOUTH ONCE DAILY 90 tablet 1  .  metoprolol succinate (TOPROL-XL) 50 MG 24 hr tablet TAKE 2 TABLETS BY MOUTH DAILY. TAKE WITH OR IMMEDIATELY FOLLOWING A MEAL 180 tablet 1  . ranitidine (ZANTAC) 150 MG tablet Take 150 mg by mouth daily as needed for heartburn.     . senna-docusate (SENOKOT-S) 8.6-50 MG tablet Take 1 tablet by mouth 2 (two) times daily.    Marland Kitchen sulfaSALAzine (AZULFIDINE) 500 MG EC tablet TAKE 2 TABLETS BY MOUTH TWICE DAILY 120 tablet 0  . tiZANidine (ZANAFLEX) 4 MG tablet Take 0.5-1 tablets (2-4 mg total) by mouth every 8 (eight) hours as needed for muscle spasms. 20 tablet 0  . Vit B6-Vit B12-Omega 3 Acids (VITAMIN  B PLUS+ PO) Take 1 tablet by mouth every evening.     No current facility-administered medications on file prior to visit.     Allergies:  Allergies  Allergen Reactions  . Amlodipine Besylate Other (See Comments)    Tremors  . Nortriptyline Other (See Comments)    tremors  . Dilaudid [Hydromorphone Hcl]     Headache, muscle tightness    Review of Systems:  CONSTITUTIONAL: No fevers, chills, night sweats, or weight loss.  EYES: No visual changes or eye pain ENT: No hearing changes.  No history of nose bleeds.   RESPIRATORY: No cough, wheezing and shortness of breath.   CARDIOVASCULAR: Negative for chest pain, and palpitations.   GI: Negative for abdominal discomfort, blood in stools or black stools.  No recent change in bowel habits.   GU:  No history of incontinence.   MUSCLOSKELETAL: +history of joint pain +swelling.  No myalgias.   SKIN: Negative for lesions, rash, and itching.   ENDOCRINE: Negative for cold or heat intolerance, polydipsia or goiter.   PSYCH:  No depression or anxiety symptoms.   NEURO: As Above.   Vital Signs:  BP 140/88   Pulse 72   Ht 5\' 6"  (1.676 m)   Wt 243 lb 4 oz (110.3 kg)   SpO2 97%   BMI 39.26 kg/m   General Medical Exam:   General:  Well appearing, comfortable  Eyes/ENT: see cranial nerve examination.   Neck:  No carotid bruits. Respiratory:  Clear to auscultation, good air entry bilaterally.   Cardiac:  Regular rate and rhythm, no murmur.   Ext:  Feet and toes are swollen and beet red.    Neurological Exam: MENTAL STATUS including orientation to time, place, person, recent and remote memory, attention span and concentration, language, and fund of knowledge is normal.  Speech is not dysarthric.  CRANIAL NERVES:  Face is symmetric.  MOTOR:  Motor strength is 5/5 in all extremities. Tone is normal.    SENSORY:  Vibration is reduced distal to ankles bilaterally.  Temperature is intact.  COORDINATION/GAIT:   Gait narrow based and  stable.   Data: MRI lumbar spine 03/19/2016: 1. Severe facet arthrosis at L5-S1 with grade 1 anterolisthesis and moderate right lateral recess stenosis, potentially affecting the right S1 nerve. 2. Mild right and moderate left lateral recess stenosis at L4-5. 3. Moderate left lateral recess stenosis at L3-4.  EKG 10/10/2016:  QTc 442  LAbs 12/18/2016:  TSH 0.89, vitamin B12 405, copper 91, SPEP with IFE no M protein  NCS/EMG of the legs 01/02/2017:   This study was technically challenged and limited by lower extremity edema.  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  Clinical correlation recommended.  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 Previously tried:  Lyrica (swelling), nortriptyline (tremors), Cymbalta (swelling)   Continue gabapentin 300mg  twice daily.  OK to add extra 100-200mg  in the afternoon, as needed.   She has been on aspirin daily which does not provide any relief with respect to erythromelalgia Advised to use compression stockings for swelling, since pain is better when swelling is less Driving precautions discussed, especially if she is unable to manipulate pedals safely  Return to clinic in 6 months    Thank you for allowing me to participate in patient's care.  If I can answer any additional questions, I would be pleased to do so.    Sincerely,    Ali Mohl K. Posey Pronto, DO

## 2018-08-17 ENCOUNTER — Other Ambulatory Visit: Payer: Self-pay | Admitting: Family Medicine

## 2018-08-17 MED FILL — METOPROLOL SUCCINATE ER 50: 50 | 90 days supply | Qty: 180 | Fill #0

## 2018-08-22 ENCOUNTER — Encounter: Payer: Self-pay | Admitting: Family Medicine

## 2018-08-22 ENCOUNTER — Ambulatory Visit: Payer: 59 | Admitting: Family Medicine

## 2018-08-22 VITALS — BP 142/84 | HR 96 | Temp 99.2°F | Resp 16 | Ht 66.0 in | Wt 243.0 lb

## 2018-08-22 DIAGNOSIS — J209 Acute bronchitis, unspecified: Secondary | ICD-10-CM | POA: Diagnosis not present

## 2018-08-22 MED ORDER — BENZONATATE 100 MG PO CAPS: 100.0000 mg | ORAL_CAPSULE | Freq: Two times a day (BID) | ORAL | 0 refills | Status: DC | PRN

## 2018-08-22 MED ORDER — FLUTICASONE PROPIONATE 50 MCG/ACT NA SUSP: 2.0000 | Freq: Every day | NASAL | 6 refills | Status: DC

## 2018-08-22 NOTE — Progress Notes (Signed)
Subjective  CC:  Chief Complaint  Patient presents with  . Cough    chest congestion, sore throat, runny nose, sinus concern x Wednesday' taking mucinex     HPI: SUBJECTIVE:  [name] is a 68 y.o. female who complains of congestion, nasal blockage, post nasal drip, cough described as painful and productive and denies sinus, high fevers, SOB, chest pain . Did have myalgias and diarrhea as well. Eating and drinking ok.  Symptoms have been present for 3-5 days. Has had pneumonia in the past and worries about that. She denies a history of anorexia, dizziness, vomiting and wheezing. She denies a history of asthma or COPD. Patient does not smoke cigarettes. Remote h/o smoking.  Assessment  1. Acute bronchitis, unspecified organism      Plan  Discussion:  Viral bronchitis with productive cough and worsening symptoms. Education regarding differences between viral and bacterial infections and treatment options are discussed.  Supportive care measures are recommended.  We discussed the use of mucolytic's, decongestants, antihistamines and antitussives as needed.  Tylenol or Advil are recommended if needed.  Follow up: No follow-ups on file.   No orders of the defined types were placed in this encounter.  Meds ordered this encounter  Medications  . fluticasone (FLONASE) 50 MCG/ACT nasal spray    Sig: Place 2 sprays into both nostrils daily.    Dispense:  16 g    Refill:  6  . benzonatate (TESSALON) 100 MG capsule    Sig: Take 1 capsule (100 mg total) by mouth 2 (two) times daily as needed for cough.    Dispense:  20 capsule    Refill:  0      I reviewed the patients updated PMH, FH, and SocHx.  Social History: Patient  reports that she quit smoking about 35 years ago. Her smoking use included cigarettes. She has a 16.00 pack-year smoking history. She quit smokeless tobacco use about 37 years ago. She reports that she does not drink alcohol or use drugs.  Patient Active Problem  List   Diagnosis Date Noted  . Morbid obesity ([place]) 05/12/2018  . Hyperglycemia 05/12/2018  . Lower extremity edema -chronic 05/12/2018  . Iron deficiency anemia 05/12/2018  . Erythromelalgia ([place]) 10/17/2017  . Hyperparathyroidism ([place]) 05/06/2017  . Lumbar stenosis with neurogenic claudication 10/11/2016  . H/O breast augmentation 09/12/2016  . L-S radiculopathy 05/30/2016  . Neck mass 05/30/2015  . Hx of colonic polyp - ssp 11/03/2014  . Neuropathy 07/06/2014  . Hyperlipidemia 06/02/2014  . Anxiety state 06/02/2014  . Allergic rhinitis   . Osteoporosis 10/17/2011  . Rheumatoid arthritis ([place])   . Hypertension     Review of Systems: Cardiovascular: negative for chest pain Respiratory: negative for SOB or hemoptysis Gastrointestinal: negative for abdominal pain Genitourinary: negative for dysuria or gross hematuria Current Meds  Medication Sig  . aspirin 81 MG tablet Take 81 mg by mouth at bedtime.   Candee Furbish Paint Modified 1.5 % LIQD Paint between toes after foot soaks up to 3 times per day.  . Cholecalciferol (VITAMIN D-3) 5000 units TABS Take 1 tablet by mouth daily.  . furosemide (LASIX) 20 MG tablet Take 3 tablets (60 mg total) by mouth daily.  Marland Kitchen gabapentin (NEURONTIN) 300 MG capsule Take 1 capsule (300 mg total) by mouth 2 (two) times daily.  . hydroxychloroquine (PLAQUENIL) 200 MG tablet Take 200 mg by mouth daily. Patient is currently taking  . IRON PO Take by mouth.  . losartan (  COZAAR) 100 MG tablet TAKE 1 TABLET BY MOUTH ONCE DAILY  . metoprolol succinate (TOPROL-XL) 50 MG 24 hr tablet TAKE 2 TABLETS BY MOUTH DAILY. TAKE WITH OR IMMEDIATELY FOLLOWING A MEAL  . ranitidine (ZANTAC) 150 MG tablet Take 150 mg by mouth daily as needed for heartburn.   . senna-docusate (SENOKOT-S) 8.6-50 MG tablet Take 1 tablet by mouth 2 (two) times daily.  Marland Kitchen sulfaSALAzine (AZULFIDINE) 500 MG EC tablet TAKE 2 TABLETS BY MOUTH TWICE DAILY  . tiZANidine (ZANAFLEX) 4 MG tablet Take  0.5-1 tablets (2-4 mg total) by mouth every 8 (eight) hours as needed for muscle spasms.  . Vit B6-Vit B12-Omega 3 Acids (VITAMIN B PLUS+ PO) Take 1 tablet by mouth every evening.    Objective  Vitals: BP (!) 142/84 (BP Location: Left Arm, Patient Position: Sitting, Cuff Size: Large)   Pulse 96   Temp 99.2 F (37.3 C) (Oral)   Resp 16   Ht 5\' 6"  (1.676 m)   Wt 243 lb (110.2 kg)   SpO2 95%   BMI 39.22 kg/m  General: no acute distress , no respiratory distress Psych:  Alert and oriented, normal mood and affect HEENT:  Normocephalic, atraumatic, supple neck, moist mucous membranes, mildly erythematous pharynx without exudate, mild lymphadenopathy, supple neck Cardiovascular:  RRR without murmur. no edema Respiratory:  Good breath sounds bilaterally, CTAB with normal respiratory effort with occasional rhonchi Skin:  Warm, no rashes Neurologic:   Mental status is normal. normal gait  Commons side effects, risks, benefits, and alternatives for medications and treatment plan prescribed today were discussed, and the patient expressed understanding of the given instructions. Patient is instructed to call or message via MyChart if he/she has any questions or concerns regarding our treatment plan. No barriers to understanding were identified. We discussed Red Flag symptoms and signs in detail. Patient expressed understanding regarding what to do in case of urgent or emergency type symptoms.  Medication list was reconciled, printed and provided to the patient in AVS. Patient instructions and summary information was reviewed with the patient as documented in the AVS. This note was prepared with assistance of Dragon voice recognition software. Occasional wrong-word or sound-a-like substitutions may have occurred due to the inherent limitations of voice recognition software

## 2018-08-22 NOTE — Patient Instructions (Addendum)
Please follow up if symptoms do not improve or as needed.   You may use Delsym cough syrup or Mucinex DM to help with congestion and coughing.   Acute Bronchitis, Adult  Acute bronchitis is sudden (acute) swelling of the air tubes (bronchi) in the lungs. Acute bronchitis causes these tubes to fill with mucus, which can make it hard to breathe. It can also cause coughing or wheezing. In adults, acute bronchitis usually goes away within 2 weeks. A cough caused by bronchitis may last up to 3 weeks. Smoking, allergies, and asthma can make the condition worse. Repeated episodes of bronchitis may cause further lung problems, such as chronic obstructive pulmonary disease (COPD). What are the causes? This condition can be caused by germs and by substances that irritate the lungs, including:  Cold and flu viruses. This condition is most often caused by the same virus that causes a cold.  Bacteria.  Exposure to tobacco smoke, dust, fumes, and air pollution. What increases the risk? This condition is more likely to develop in people who:  Have close contact with someone with acute bronchitis.  Are exposed to lung irritants, such as tobacco smoke, dust, fumes, and vapors.  Have a weak immune system.  Have a respiratory condition such as asthma. What are the signs or symptoms? Symptoms of this condition include:  A cough.  Coughing up clear, yellow, or green mucus.  Wheezing.  Chest congestion.  Shortness of breath.  A fever.  Body aches.  Chills.  A sore throat. How is this diagnosed? This condition is usually diagnosed with a physical exam. During the exam, your health care provider may order tests, such as chest X-rays, to rule out other conditions. He or she may also:  Test a sample of your mucus for bacterial infection.  Check the level of oxygen in your blood. This is done to check for pneumonia.  Do a chest X-ray or lung function testing to rule out pneumonia and other  conditions.  Perform blood tests. Your health care provider will also ask about your symptoms and medical history. How is this treated? Most cases of acute bronchitis clear up over time without treatment. Your health care provider may recommend:  Drinking more fluids. Drinking more makes your mucus thinner, which may make it easier to breathe.  Taking a medicine for a fever or cough.  Taking an antibiotic medicine.  Using an inhaler to help improve shortness of breath and to control a cough.  Using a cool mist vaporizer or humidifier to make it easier to breathe. Follow these instructions at home: Medicines  Take over-the-counter and prescription medicines only as told by your health care provider.  If you were prescribed an antibiotic, take it as told by your health care provider. Do not stop taking the antibiotic even if you start to feel better. General instructions   Get plenty of rest.  Drink enough fluids to keep your urine pale yellow.  Avoid smoking and secondhand smoke. Exposure to cigarette smoke or irritating chemicals will make bronchitis worse. If you smoke and you need help quitting, ask your health care provider. Quitting smoking will help your lungs heal faster.  Use an inhaler, cool mist vaporizer, or humidifier as told by your health care provider.  Keep all follow-up visits as told by your health care provider. This is important. How is this prevented? To lower your risk of getting this condition again:  Wash your hands often with soap and water. If soap and  water are not available, use hand sanitizer.  Avoid contact with people who have cold symptoms.  Try not to touch your hands to your mouth, nose, or eyes.  Make sure to get the flu shot every year. Contact a health care provider if:  Your symptoms do not improve in 2 weeks of treatment. Get help right away if:  You cough up blood.  You have chest pain.  You have severe shortness of  breath.  You become dehydrated.  You faint or keep feeling like you are going to faint.  You keep vomiting.  You have a severe headache.  Your fever or chills gets worse. This information is not intended to replace advice given to you by your health care provider. Make sure you discuss any questions you have with your health care provider. Document Released: 09/12/2004 Document Revised: 03/19/2017 Document Reviewed: 01/24/2016 Elsevier Interactive Patient Education  2019 [employer].

## 2018-08-25 ENCOUNTER — Telehealth: Payer: Self-pay | Admitting: Family Medicine

## 2018-08-25 NOTE — Telephone Encounter (Signed)
Pt did not have wheezing on exam. I do not recommend an inhaler.  If she feels worse, she should f/u with her PCP for recheck.

## 2018-08-25 NOTE — Telephone Encounter (Signed)
Copied from [place] [id number]. Topic: Quick Communication - See Telephone Encounter >> Aug 25, 2018 12:06 PM Rutherford Nail, NT wrote: CRM for notification. See Telephone encounter for: 08/25/18. Patient calling and states that she seen Dr Jonni Sanger on 08/22/2018 at [facility] and was diagnosed with bronchitis. Would like to know if she would be willing to send in an inhaler to open up her lungs? States that she is unable to take the Mucinex DM. Please advise. Stella [place], [place] - [address]

## 2018-08-25 NOTE — Telephone Encounter (Signed)
Please see below and advise.

## 2018-08-26 NOTE — Telephone Encounter (Signed)
Left detailed message on voicemail ([place] per [facility]).

## 2018-08-27 DIAGNOSIS — J322 Chronic ethmoidal sinusitis: Secondary | ICD-10-CM | POA: Diagnosis not present

## 2018-08-27 DIAGNOSIS — J320 Chronic maxillary sinusitis: Secondary | ICD-10-CM | POA: Diagnosis not present

## 2018-08-27 DIAGNOSIS — H6523 Chronic serous otitis media, bilateral: Secondary | ICD-10-CM | POA: Diagnosis not present

## 2018-08-27 DIAGNOSIS — J410 Simple chronic bronchitis: Secondary | ICD-10-CM | POA: Diagnosis not present

## 2018-08-27 DIAGNOSIS — J370 Chronic laryngitis: Secondary | ICD-10-CM | POA: Diagnosis not present

## 2018-08-27 MED FILL — ALLEGRA-D 12 HOUR TABLET: 60MG-120MG | 40 days supply | Qty: 20 | Fill #0

## 2018-08-27 MED FILL — CEFUROXIME AXETIL 250 MG TA: 250 | 10 days supply | Qty: 20 | Fill #0

## 2018-08-27 MED FILL — GABAPENTIN 300 MG CAPSULE: 300 | 90 days supply | Qty: 180 | Fill #2

## 2018-09-03 ENCOUNTER — Encounter: Payer: 59 | Admitting: Internal Medicine

## 2018-09-09 MED FILL — LOSARTAN POTASSIUM 100 MG T: 100 | 30 days supply | Qty: 30 | Fill #1

## 2018-09-14 ENCOUNTER — Ambulatory Visit: Payer: 59 | Admitting: Family Medicine

## 2018-09-16 ENCOUNTER — Ambulatory Visit (AMBULATORY_SURGERY_CENTER): Payer: Self-pay

## 2018-09-16 ENCOUNTER — Other Ambulatory Visit: Payer: Self-pay

## 2018-09-16 ENCOUNTER — Encounter: Payer: Self-pay | Admitting: Internal Medicine

## 2018-09-16 VITALS — Ht 65.0 in | Wt 243.8 lb

## 2018-09-16 DIAGNOSIS — R790 Abnormal level of blood mineral: Secondary | ICD-10-CM

## 2018-09-16 DIAGNOSIS — Z86010 Personal history of colonic polyps: Secondary | ICD-10-CM

## 2018-09-16 DIAGNOSIS — K219 Gastro-esophageal reflux disease without esophagitis: Secondary | ICD-10-CM

## 2018-09-16 NOTE — Progress Notes (Addendum)
No egg or soy allergy known to patient  No issues with past sedation with any surgeries  or procedures, no intubation problems  No diet pills per patient No home 02 use per patient  No blood thinners per patient  Pt denies issues with constipation  No A fib or A flutter  EMMI video sent to pt's e mail , pt declined    

## 2018-09-17 DIAGNOSIS — H6122 Impacted cerumen, left ear: Secondary | ICD-10-CM | POA: Diagnosis not present

## 2018-09-22 DIAGNOSIS — E210 Primary hyperparathyroidism: Secondary | ICD-10-CM | POA: Diagnosis not present

## 2018-09-24 ENCOUNTER — Other Ambulatory Visit: Payer: Self-pay | Admitting: Surgery

## 2018-09-24 ENCOUNTER — Other Ambulatory Visit (HOSPITAL_COMMUNITY): Payer: Self-pay | Admitting: Surgery

## 2018-09-24 DIAGNOSIS — E210 Primary hyperparathyroidism: Secondary | ICD-10-CM

## 2018-09-28 ENCOUNTER — Other Ambulatory Visit: Payer: Self-pay | Admitting: Family Medicine

## 2018-09-28 MED FILL — FUROSEMIDE 20 MG TABS: 20 | 90 days supply | Qty: 180 | Fill #0

## 2018-09-30 ENCOUNTER — Encounter: Payer: Self-pay | Admitting: Certified Registered Nurse Anesthetist

## 2018-09-30 ENCOUNTER — Ambulatory Visit (AMBULATORY_SURGERY_CENTER): Payer: 59 | Admitting: Internal Medicine

## 2018-09-30 VITALS — BP 155/62 | HR 72 | Temp 98.4°F | Resp 14 | Ht 66.0 in | Wt 243.0 lb

## 2018-09-30 DIAGNOSIS — D123 Benign neoplasm of transverse colon: Secondary | ICD-10-CM | POA: Diagnosis not present

## 2018-09-30 DIAGNOSIS — K2970 Gastritis, unspecified, without bleeding: Secondary | ICD-10-CM | POA: Diagnosis not present

## 2018-09-30 DIAGNOSIS — K219 Gastro-esophageal reflux disease without esophagitis: Secondary | ICD-10-CM

## 2018-09-30 DIAGNOSIS — K2100 Gastro-esophageal reflux disease with esophagitis, without bleeding: Secondary | ICD-10-CM

## 2018-09-30 DIAGNOSIS — K649 Unspecified hemorrhoids: Secondary | ICD-10-CM

## 2018-09-30 DIAGNOSIS — B9681 Helicobacter pylori [H. pylori] as the cause of diseases classified elsewhere: Secondary | ICD-10-CM

## 2018-09-30 DIAGNOSIS — D508 Other iron deficiency anemias: Secondary | ICD-10-CM

## 2018-09-30 DIAGNOSIS — K5730 Diverticulosis of large intestine without perforation or abscess without bleeding: Secondary | ICD-10-CM | POA: Diagnosis not present

## 2018-09-30 DIAGNOSIS — D125 Benign neoplasm of sigmoid colon: Secondary | ICD-10-CM

## 2018-09-30 DIAGNOSIS — D124 Benign neoplasm of descending colon: Secondary | ICD-10-CM

## 2018-09-30 DIAGNOSIS — Z86010 Personal history of colonic polyps: Secondary | ICD-10-CM | POA: Diagnosis not present

## 2018-09-30 DIAGNOSIS — D649 Anemia, unspecified: Secondary | ICD-10-CM | POA: Diagnosis not present

## 2018-09-30 DIAGNOSIS — K259 Gastric ulcer, unspecified as acute or chronic, without hemorrhage or perforation: Secondary | ICD-10-CM | POA: Diagnosis not present

## 2018-09-30 DIAGNOSIS — K2950 Unspecified chronic gastritis without bleeding: Secondary | ICD-10-CM

## 2018-09-30 MED ORDER — SODIUM CHLORIDE 0.9 % IV SOLN: 500.0000 mL | Freq: Once | INTRAVENOUS | Status: DC

## 2018-09-30 NOTE — Progress Notes (Signed)
I have reviewed the patient's medical history in detail and updated the computerized patient record.

## 2018-09-30 NOTE — Op Note (Signed)
[place] Patient Name: [name] Procedure Date: 09/30/2018 10:17 AM MRN: [id number] Endoscopist: Gatha Mayer , MD Age: 68 Referring MD:  Date of Birth: [birth year] Gender: Female Account #: [id number] Procedure:                Colonoscopy Indications:              Iron deficiency anemia Medicines:                Propofol per Anesthesia, Monitored Anesthesia Care Procedure:                Pre-Anesthesia Assessment:                           - Prior to the procedure, a History and Physical                            was performed, and patient medications and                            allergies were reviewed. The patient's tolerance of                            previous anesthesia was also reviewed. The risks                            and benefits of the procedure and the sedation                            options and risks were discussed with the patient.                            All questions were answered, and informed consent                            was obtained. Prior Anticoagulants: The patient has                            taken no previous anticoagulant or antiplatelet                            agents. ASA Grade Assessment: II - A patient with                            mild systemic disease. After reviewing the risks                            and benefits, the patient was deemed in                            satisfactory condition to undergo the procedure.                           After obtaining informed consent, the colonoscope  was passed under direct vision. Throughout the                            procedure, the patient's blood pressure, pulse, and                            oxygen saturations were monitored continuously. The                            Colonoscope was introduced through the anus and                            advanced to the the cecum, identified by                            appendiceal orifice  and ileocecal valve. The                            colonoscopy was performed without difficulty. The                            patient tolerated the procedure well. The quality                            of the bowel preparation was good. The ileocecal                            valve, appendiceal orifice, and rectum were                            photographed. Scope In: 10:33:35 AM Scope Out: 97:67:34 AM Scope Withdrawal Time: 0 hours 17 minutes 43 seconds  Total Procedure Duration: 0 hours 23 minutes 24 seconds  Findings:                 The perianal and digital rectal examinations were                            normal.                           A 8 mm polyp was found in the sigmoid colon. The                            polyp was sessile. The polyp was removed with a                            cold snare. Resection and retrieval were complete.                            Verification of patient identification for the                            specimen was done. Estimated blood loss was minimal.  Three flat and sessile polyps were found in the                            descending colon and transverse colon. The polyps                            were diminutive in size. These polyps were removed                            with a cold biopsy forceps. Resection and retrieval                            were complete. Verification of patient                            identification for the specimen was done. Estimated                            blood loss was minimal.                           Multiple diverticula were found in the sigmoid                            colon.                           Internal hemorrhoids were found.                           The exam was otherwise without abnormality on                            direct and retroflexion views. Complications:            No immediate complications. Estimated Blood Loss:     Estimated blood loss  was minimal. Impression:               - One 8 mm polyp in the sigmoid colon, removed with                            a cold snare. Resected and retrieved.                           - Three diminutive polyps in the descending colon                            and in the transverse colon, removed with a cold                            biopsy forceps. Resected and retrieved.                           - Diverticulosis in the sigmoid colon.                           -  Internal hemorrhoids.                           - The examination was otherwise normal on direct                            and retroflexion views.                           - Personal history of colonic polyp ssp 2016. Recommendation:           - Patient has a contact number available for                            emergencies. The signs and symptoms of potential                            delayed complications were discussed with the                            patient. Return to normal activities tomorrow.                            Written discharge instructions were provided to the                            patient.                           - Resume previous diet.                           - Continue present medications.                           - Repeat colonoscopy is recommended. The                            colonoscopy date will be determined after pathology                            results from today's exam become available for                            review. Gatha Mayer, MD 09/30/2018 11:16:22 AM This report has been signed electronically.

## 2018-09-30 NOTE — Progress Notes (Signed)
Called to room to assist during endoscopic procedure.  Patient ID and intended procedure confirmed with present staff. Received instructions for my participation in the procedure from the performing physician.  

## 2018-09-30 NOTE — Progress Notes (Signed)
PT taken to PACU. Monitors in place. VSS. Report given to RN. 

## 2018-09-30 NOTE — Op Note (Signed)
[place] Patient Name: [name] Procedure Date: 09/30/2018 10:18 AM MRN: [id number] Endoscopist: Gatha Mayer , MD Age: 68 Referring MD:  Date of Birth: 12/16/1950 Gender: Female Account #: [id number] Procedure:                Upper GI endoscopy Indications:              Iron deficiency anemia Medicines:                Propofol per Anesthesia, Monitored Anesthesia Care Procedure:                Pre-Anesthesia Assessment:                           - Prior to the procedure, a History and Physical                            was performed, and patient medications and                            allergies were reviewed. The patient's tolerance of                            previous anesthesia was also reviewed. The risks                            and benefits of the procedure and the sedation                            options and risks were discussed with the patient.                            All questions were answered, and informed consent                            was obtained. Prior Anticoagulants: The patient has                            taken no previous anticoagulant or antiplatelet                            agents. ASA Grade Assessment: II - A patient with                            mild systemic disease. After reviewing the risks                            and benefits, the patient was deemed in                            satisfactory condition to undergo the procedure.                           After obtaining informed consent, the endoscope was  passed under direct vision. Throughout the                            procedure, the patient's blood pressure, pulse, and                            oxygen saturations were monitored continuously. The                            Endoscope was introduced through the mouth, and                            advanced to the second part of duodenum. The upper                            GI  endoscopy was accomplished without difficulty.                            The patient tolerated the procedure well. Scope In: Scope Out: Findings:                 LA Grade A (one or more mucosal breaks less than 5                            mm, not extending between tops of 2 mucosal folds)                            esophagitis with no bleeding was found at the                            gastroesophageal junction.                           One non-bleeding superficial gastric ulcer with no                            stigmata of bleeding was found on the greater                            curvature of the stomach. The lesion was 6 mm in                            largest dimension. Biopsies were taken with a cold                            forceps for histology. Verification of patient                            identification for the specimen was done. Estimated                            blood loss was minimal.  The exam was otherwise without abnormality.                           The cardia and gastric fundus were normal on                            retroflexion. Complications:            No immediate complications. Estimated Blood Loss:     Estimated blood loss was minimal. Impression:               - LA Grade A reflux esophagitis.                           - Non-bleeding gastric ulcer with no stigmata of                            bleeding. Biopsied.                           - The examination was otherwise normal. Recommendation:           - Patient has a contact number available for                            emergencies. The signs and symptoms of potential                            delayed complications were discussed with the                            patient. Return to normal activities tomorrow.                            Written discharge instructions were provided to the                            patient.                           - Resume  previous diet.                           - Continue present medications.                           - Await pathology results.                           - See the other procedure note for documentation of                            additional recommendations.                           - Not currently on acid suppression - ranitidine in  past.                           Will recommend treatment pending biopsies. Gatha Mayer, MD 09/30/2018 11:13:27 AM This report has been signed electronically.

## 2018-09-30 NOTE — Patient Instructions (Addendum)
I found a small ulcer in the stomach and took biopsies. There is some reflux-related inflammation where the esophagus and stomach meet.  There were 4 colon polyps that I removed, diverticulosis and hemorrhoids.  I will let you know pathology results and when to have another routine colonoscopy and will make treatment recommendations. I think you will need to take regular acid blocking medication. I appreciate the opportunity to care for you. Gatha Mayer, MD, [facility]  Please read handouts provided. Await pathology results. Continue present medications.     YOU HAD AN ENDOSCOPIC PROCEDURE TODAY AT [facility]:   Refer to the procedure report that was given to you for any specific questions about what was found during the examination.  If the procedure report does not answer your questions, please call your gastroenterologist to clarify.  If you requested that your care partner not be given the details of your procedure findings, then the procedure report has been included in a sealed envelope for you to review at your convenience later.  YOU SHOULD EXPECT: Some feelings of bloating in the abdomen. Passage of more gas than usual.  Walking can help get rid of the air that was put into your GI tract during the procedure and reduce the bloating. If you had a lower endoscopy (such as a colonoscopy or flexible sigmoidoscopy) you may notice spotting of blood in your stool or on the toilet paper. If you underwent a bowel prep for your procedure, you may not have a normal bowel movement for a few days.  Please Note:  You might notice some irritation and congestion in your nose or some drainage.  This is from the oxygen used during your procedure.  There is no need for concern and it should clear up in a day or so.  SYMPTOMS TO REPORT IMMEDIATELY:   Following lower endoscopy (colonoscopy or flexible sigmoidoscopy):  Excessive amounts of blood in the stool  Significant  tenderness or worsening of abdominal pains  Swelling of the abdomen that is new, acute  Fever of 100F or higher   Following upper endoscopy (EGD)  Vomiting of blood or coffee ground material  New chest pain or pain under the shoulder blades  Painful or persistently difficult swallowing  New shortness of breath  Fever of 100F or higher  Black, tarry-looking stools  For urgent or emergent issues, a gastroenterologist can be reached at any hour by calling [phone].   DIET:  We do recommend a small meal at first, but then you may proceed to your regular diet.  Drink plenty of fluids but you should avoid alcoholic beverages for 24 hours.  ACTIVITY:  You should plan to take it easy for the rest of today and you should NOT DRIVE or use heavy machinery until tomorrow (because of the sedation medicines used during the test).    FOLLOW UP: Our staff will call the number listed on your records the next business day following your procedure to check on you and address any questions or concerns that you may have regarding the information given to you following your procedure. If we do not reach you, we will leave a message.  However, if you are feeling well and you are not experiencing any problems, there is no need to return our call.  We will assume that you have returned to your regular daily activities without incident.  If any biopsies were taken you will be contacted by phone or by letter within  the next 1-3 weeks.  Please call us at [phone] if you have not heard about the biopsies in 3 weeks.    SIGNATURES/CONFIDENTIALITY: You and/or your care partner have signed paperwork which will be entered into your electronic medical record.  These signatures attest to the fact that that the information above on your After Visit Summary has been reviewed and is understood.  Full responsibility of the confidentiality of this discharge information lies with you and/or your care-partner.

## 2018-10-01 ENCOUNTER — Telehealth: Payer: Self-pay | Admitting: *Deleted

## 2018-10-01 NOTE — Telephone Encounter (Signed)
  Follow up Call-  Call back number 09/30/2018  Post procedure Call Back phone  # [phone]  Permission to leave phone message Yes  Some recent data might be hidden     Patient questions:  Do you have a fever, pain , or abdominal swelling? No. Pain Score  0 *  Have you tolerated food without any problems? Yes.    Have you been able to return to your normal activities? Yes.    Do you have any questions about your discharge instructions: Diet   No. Medications  No. Follow up visit  No.  Do you have questions or concerns about your Care? No.  Actions: * If pain score is 4 or above: No action needed, pain <4.

## 2018-10-05 ENCOUNTER — Encounter: Payer: Self-pay | Admitting: Internal Medicine

## 2018-10-05 DIAGNOSIS — K2970 Gastritis, unspecified, without bleeding: Secondary | ICD-10-CM

## 2018-10-05 DIAGNOSIS — B9681 Helicobacter pylori [H. pylori] as the cause of diseases classified elsewhere: Secondary | ICD-10-CM | POA: Insufficient documentation

## 2018-10-05 HISTORY — DX: Helicobacter pylori (H. pylori) as the cause of diseases classified elsewhere: B96.81

## 2018-10-05 NOTE — Progress Notes (Signed)
[facility] 5 year colon recall and no letter  Office  Let her know: -  has H pylori stomach infection   1) Omeprazole 20 mg 2 times a day x 14 d 2) Pepto Bismol 2 tabs (262 mg each) 4 times a day x 14 d 3) Metronidazole 250 mg 4 times a day x 14 d 4) doxycycline 100 mg 2 times a day x 14 d  After 14 d stop omeprazole also  In 4 weeks after treatment completed do H. Pylori stool antigen - dx H. Pylori gastritis  - 2 precancerous colon polyp and will recall 2025

## 2018-10-06 ENCOUNTER — Other Ambulatory Visit: Payer: Self-pay

## 2018-10-06 ENCOUNTER — Telehealth: Payer: Self-pay | Admitting: Internal Medicine

## 2018-10-06 DIAGNOSIS — A048 Other specified bacterial intestinal infections: Secondary | ICD-10-CM

## 2018-10-06 MED ORDER — METRONIDAZOLE 250 MG PO TABS: 250.0000 mg | ORAL_TABLET | Freq: Four times a day (QID) | ORAL | 0 refills | Status: AC

## 2018-10-06 MED ORDER — DOXYCYCLINE HYCLATE 100 MG PO CAPS: 100.0000 mg | ORAL_CAPSULE | Freq: Two times a day (BID) | ORAL | 0 refills | Status: AC

## 2018-10-06 MED ORDER — OMEPRAZOLE 20 MG PO CPDR: 20.0000 mg | DELAYED_RELEASE_CAPSULE | Freq: Two times a day (BID) | ORAL | 0 refills | Status: DC

## 2018-10-06 MED ORDER — BISMUTH SUBSALICYLATE 262 MG PO CHEW: 524.0000 mg | CHEWABLE_TABLET | Freq: Four times a day (QID) | ORAL | 0 refills | Status: AC

## 2018-10-06 MED FILL — DOXYCYCLINE HYCLATE 100 MG: 100 | 14 days supply | Qty: 28 | Fill #0

## 2018-10-06 MED FILL — metroNIDAZOLE 250 MG TABS: 250 | 14 days supply | Qty: 56 | Fill #0

## 2018-10-06 MED FILL — OMEPRAZOLE 20 MG CPDR: 20 | 14 days supply | Qty: 28 | Fill #0

## 2018-10-06 NOTE — Telephone Encounter (Signed)
Went over the instructions and patient is going to make a chart to keep up with it all.

## 2018-10-06 NOTE — Telephone Encounter (Signed)
Pls call pt, she needs instructions in how to take new prescriptions, she got confused with the instructions from the pharmacy.

## 2018-10-13 MED FILL — LOSARTAN POTASSIUM 100 MG T: 100 | 30 days supply | Qty: 30 | Fill #2

## 2018-10-13 MED FILL — HYDROXYCHLOROQUINE SULFATE: 200 | 90 days supply | Qty: 90 | Fill #1

## 2018-10-15 ENCOUNTER — Encounter (HOSPITAL_COMMUNITY)
Admission: RE | Admit: 2018-10-15 | Discharge: 2018-10-15 | Disposition: A | Discharge status: Home / Self Care | Payer: 59 | Source: Ambulatory Visit | Attending: Surgery | Admitting: Surgery

## 2018-10-15 ENCOUNTER — Ambulatory Visit (HOSPITAL_COMMUNITY)
Admission: RE | Admit: 2018-10-15 | Discharge: 2018-10-15 | Disposition: A | Discharge status: Home / Self Care | Payer: 59 | Source: Ambulatory Visit | Attending: Surgery | Admitting: Surgery

## 2018-10-15 DIAGNOSIS — E210 Primary hyperparathyroidism: Secondary | ICD-10-CM

## 2018-10-15 DIAGNOSIS — E213 Hyperparathyroidism, unspecified: Secondary | ICD-10-CM | POA: Diagnosis not present

## 2018-10-15 MED ORDER — TECHNETIUM TC 99M SESTAMIBI GENERIC - CARDIOLITE
25.0000 | Freq: Once | INTRAVENOUS | Status: AC | PRN
  Administered 2018-10-15: 25 via INTRAVENOUS

## 2018-10-28 MED FILL — sulfaSALAzine 500 MG TABS: 500 | 90 days supply | Qty: 360 | Fill #1

## 2018-10-28 NOTE — Progress Notes (Signed)
HPI:  Using dictation device. Unfortunately this device frequently misinterprets words/phrases.  [name] is a pleasant 68 y.o. here for follow up. Chronic medical problems summarized below were reviewed for changes and stability and were updated as needed below. These issues and their treatment remain stable for the most part. Denies CP, SOB, DOE, treatment intolerance or new symptoms. Uses lasix as needed for increased swelling in legs.  HTN/HLD/MOrbid obesity/LE edema: -lasix 40mg , losartan, toprol XL -refuses medication for cholesterol  Osteoporosis (osteopenia +fractures)/hypoparathyroidism/Vit D Def: -seeing Dr. Cruzita Lederer  Chronic low back pain: -sees Dr. Vertell Limber, s/p spinal fusion surgery  RA: -sees rheumatolgist for management (Dr. Estanislado Pandy) -meds: sulfasalazone, plaquenil  Neuropathy/Erythromelagia/LE edema: -chronic LE pain -has seen [place] specialist (neurology-Dr. Posey Pronto, Rheumatology, vascular, podiatry, dermatology) -meds: gabapentin  GERD: -takes ranitidine and omeprazole  ROS: See pertinent positives and negatives per HPI.  Past Medical History:  Diagnosis Date  . Allergy   . Cataract    BILATERAL-REMOVED 2 YEARS AGO  . GERD (gastroesophageal reflux disease)   . Helicobacter pylori gastritis 10/05/2018  . Hx of colonic polyp - ssp 11/03/2014  . Hypercalcemia   . Hypertension   . Left maxillary fracture ([place]) 07/17/2017  . 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 Osteopenia 10/17/2011   DEXA 09/2007: -1.4 L fem; 10/2011: -1.2 L fem   . Osteoporosis   . PONV (postoperative nausea and vomiting)   . Pseudogout of foot   . Rheumatoid arthritis(714.0) dx 2010  . Tibial plateau fracture, right, closed, initial encounter 07/16/2017    Past Surgical History:  Procedure Laterality Date  . BREAST BIOPSY  1972  . Broken wrist  2010  . CATARACT EXTRACTION  03/2012   left  . COLONOSCOPY    . COSMETIC SURGERY    . FRACTURE SURGERY    . INNER EAR SURGERY     busted ear drum  . MAXIMUM ACCESS (MAS)POSTERIOR LUMBAR INTERBODY FUSION (PLIF) 1 LEVEL N/A 10/11/2016   Procedure: Lumbar one-Sacral one Maximum access posterior lumbar interbody fusion;  Surgeon: Erline Levine, MD;  Location: [facility];  Service: Neurosurgery;  Laterality: N/A;  . ORIF WRIST FRACTURE Right 07/18/2017   Procedure: OPEN REDUCTION INTERNAL FIXATION (ORIF) WRIST FRACTURE;  Surgeon: Renette Butters, MD;  Location: [facility];  Service: Orthopedics;  Laterality: Right;  . pneumonia  2007    Family History  Problem Relation Age of Onset  . Stroke Mother   . Hypertension Father   . Heart attack Father   . Hypertension Sister        x 3  . Hypertension Brother   . Hyperthyroidism Sister        x2, s/p RAI ablation  . Hypothyroidism Brother   . Colon cancer Neg Hx   . Esophageal cancer Neg Hx   . Rectal cancer Neg Hx   . Stomach cancer Neg Hx     SOCIAL HX: see hpi   Current Outpatient Medications:  .  aspirin 81 MG tablet, Take 81 mg by mouth at bedtime. , Disp: , Rfl:  .  Castellani Paint Modified 1.5 % LIQD, Paint between toes after foot soaks up to 3 times per day., Disp: 1 Bottle, Rfl: 2 .  furosemide (LASIX) 20 MG tablet, TAKE 2 TABLETS BY MOUTH DAILY, Disp: 180 tablet, Rfl: 0 .  gabapentin (NEURONTIN) 300 MG capsule, Take 1 capsule (300 mg total) by mouth 2 (two) times daily., Disp: 180 capsule, Rfl: 3 .  hydroxychloroquine (PLAQUENIL) 200 MG tablet, Take 200  mg by mouth daily. Patient is currently taking, Disp: , Rfl:  .  IRON PO, Take by mouth., Disp: , Rfl:  .  losartan (COZAAR) 100 MG tablet, TAKE 1 TABLET BY MOUTH ONCE DAILY, Disp: 90 tablet, Rfl: 1 .  metoprolol succinate (TOPROL-XL) 50 MG 24 hr tablet, TAKE 2 TABLETS BY MOUTH DAILY. TAKE WITH OR IMMEDIATELY FOLLOWING A MEAL, Disp: 180 tablet, Rfl: 1 .  ranitidine (ZANTAC) 150 MG tablet, Take 150 mg by mouth daily as needed for heartburn. , Disp: , Rfl:  .  senna-docusate (SENOKOT-S) 8.6-50 MG tablet, Take 1 tablet  by mouth 2 (two) times daily., Disp: , Rfl:  .  sulfaSALAzine (AZULFIDINE) 500 MG EC tablet, TAKE 2 TABLETS BY MOUTH TWICE DAILY, Disp: 120 tablet, Rfl: 0 .  tiZANidine (ZANAFLEX) 4 MG tablet, Take 0.5-1 tablets (2-4 mg total) by mouth every 8 (eight) hours as needed for muscle spasms., Disp: 20 tablet, Rfl: 0 .  Vit B6-Vit B12-Omega 3 Acids (VITAMIN B PLUS+ PO), Take 1 tablet by mouth every evening., Disp: , Rfl:  .  omeprazole (PRILOSEC) 20 MG capsule, Take 1 capsule (20 mg total) by mouth 2 (two) times daily for 14 days., Disp: 28 capsule, Rfl: 0  EXAM:  Vitals:   10/29/18 1257  BP: 122/78  Pulse: 77  Temp: 98.6 F (37 C)    Body mass index is 39.3 kg/m.  GENERAL: vitals reviewed and listed above, alert, oriented, appears well hydrated and in no acute distress  HEENT: atraumatic, conjunttiva clear, no obvious abnormalities on inspection of external nose and ears  NECK: no obvious masses on inspection  LUNGS: clear to auscultation bilaterally, no wheezes, rales or rhonchi, good air movement  CV: HRRR, no peripheral edema  MS: moves all extremities without noticeable abnormality  PSYCH: pleasant and cooperative, no obvious depression or anxiety  ASSESSMENT AND PLAN:  Discussed the following assessment and plan:  Essential hypertension  Hyperlipidemia, unspecified hyperlipidemia type  Hyperglycemia  -lifestyle fecs -cont current meds -reviewd BMP from December, offered labs, she opted to check at next visit -follow in 3-4 months Patient Instructions  Please make a follow up, transfer of care visit with Dr. Ethlyn Gallery here at [facility] or with another doctor per your preference in the next 1-3 months.   We recommend the following healthy lifestyle for LIFE: 1) Small portions. But, make sure to get regular (at least 3 per day), healthy meals and small healthy snacks if needed.  2) Eat a healthy clean diet.   TRY TO EAT: -at least 5-7 servings of low sugar,  colorful, and nutrient rich vegetables per day (not corn, potatoes or bananas.) -berries are the best choice if you wish to eat fruit (only eat small amounts if trying to reduce weight)  -lean meets (fish, white meat of chicken or [place]) -vegan proteins for some meals - beans or tofu, whole grains, nuts and seeds -Replace bad fats with good fats - good fats include: fish, nuts and seeds, canola oil, olive oil -small amounts of low fat or non fat dairy -small amounts of100 % whole grains - check the lables -drink plenty of water  AVOID: -SUGAR, sweets, anything with added sugar, corn syrup or sweeteners - must read labels as even foods advertised as "healthy" often are loaded with sugar -if you must have a sweetener, small amounts of stevia may be best -sweetened beverages and artificially sweetened beverages -simple starches (rice, bread, potatoes, pasta, chips, etc - small amounts of 100% whole  grains are ok) -red meat, pork, butter -fried foods, fast food, processed food, excessive dairy, eggs and coconut.  3)Get at least 150 minutes of sweaty aerobic exercise per week.  4)Reduce stress - consider counseling, meditation and relaxation to balance other aspects of your life.    Lucretia Kern, DO

## 2018-10-29 ENCOUNTER — Ambulatory Visit: Payer: 59 | Admitting: Family Medicine

## 2018-10-29 ENCOUNTER — Encounter: Payer: Self-pay | Admitting: Family Medicine

## 2018-10-29 ENCOUNTER — Other Ambulatory Visit: Payer: Self-pay

## 2018-10-29 VITALS — BP 122/78 | HR 77 | Temp 98.6°F | Ht 66.0 in | Wt 243.5 lb

## 2018-10-29 DIAGNOSIS — I10 Essential (primary) hypertension: Secondary | ICD-10-CM | POA: Diagnosis not present

## 2018-10-29 DIAGNOSIS — R739 Hyperglycemia, unspecified: Secondary | ICD-10-CM | POA: Diagnosis not present

## 2018-10-29 DIAGNOSIS — E785 Hyperlipidemia, unspecified: Secondary | ICD-10-CM | POA: Diagnosis not present

## 2018-10-29 NOTE — Patient Instructions (Signed)
Please make a follow up, transfer of care visit with Dr. Ethlyn Gallery here at [facility] or with another doctor per your preference in the next 1-3 months.   We recommend the following healthy lifestyle for LIFE: 1) Small portions. But, make sure to get regular (at least 3 per day), healthy meals and small healthy snacks if needed.  2) Eat a healthy clean diet.   TRY TO EAT: -at least 5-7 servings of low sugar, colorful, and nutrient rich vegetables per day (not corn, potatoes or bananas.) -berries are the best choice if you wish to eat fruit (only eat small amounts if trying to reduce weight)  -lean meets (fish, white meat of chicken or [place]) -vegan proteins for some meals - beans or tofu, whole grains, nuts and seeds -Replace bad fats with good fats - good fats include: fish, nuts and seeds, canola oil, olive oil -small amounts of low fat or non fat dairy -small amounts of100 % whole grains - check the lables -drink plenty of water  AVOID: -SUGAR, sweets, anything with added sugar, corn syrup or sweeteners - must read labels as even foods advertised as "healthy" often are loaded with sugar -if you must have a sweetener, small amounts of stevia may be best -sweetened beverages and artificially sweetened beverages -simple starches (rice, bread, potatoes, pasta, chips, etc - small amounts of 100% whole grains are ok) -red meat, pork, butter -fried foods, fast food, processed food, excessive dairy, eggs and coconut.  3)Get at least 150 minutes of sweaty aerobic exercise per week.  4)Reduce stress - consider counseling, meditation and relaxation to balance other aspects of your life.

## 2018-11-03 ENCOUNTER — Other Ambulatory Visit: Payer: Self-pay | Admitting: Family Medicine

## 2018-11-03 MED FILL — LOSARTAN POTASSIUM 100 MG T: 100 | 30 days supply | Qty: 30 | Fill #3

## 2018-11-03 MED FILL — METOPROLOL SUCCINATE ER 50: 50 | 90 days supply | Qty: 180 | Fill #1

## 2018-11-04 MED FILL — tiZANidine HCL 2 MG TABS: 2 | 15 days supply | Qty: 60 | Fill #0

## 2018-11-06 ENCOUNTER — Other Ambulatory Visit: Payer: Self-pay | Admitting: Surgery

## 2018-11-06 DIAGNOSIS — E041 Nontoxic single thyroid nodule: Secondary | ICD-10-CM

## 2018-11-10 ENCOUNTER — Other Ambulatory Visit: Payer: Self-pay | Admitting: Surgery

## 2018-11-10 DIAGNOSIS — E210 Primary hyperparathyroidism: Secondary | ICD-10-CM

## 2018-11-19 MED FILL — GABAPENTIN 300 MG CAPSULE: 300 | 90 days supply | Qty: 180 | Fill #3

## 2018-11-20 IMAGING — CT CT CERVICAL SPINE W/O CM
4 of 8 series · 12 of 33 positions shown, 13 images · non-contrast
Comparison: None.

CLINICAL DATA: Fall while carrying laundry downstairs.

EXAM:
CT HEAD WITHOUT CONTRAST
CT CERVICAL SPINE WITHOUT CONTRAST
TECHNIQUE: Multidetector CT imaging of the head and cervical spine was
performed following the standard protocol without intravenous
contrast. Multiplanar CT image reconstructions of the cervical spine
were also generated.

[Series 4: bone windows · axial · 0.43mm/px · z∈[-9,+47]mm · 2 of 84 slices shown]
[im 28/84  bone]
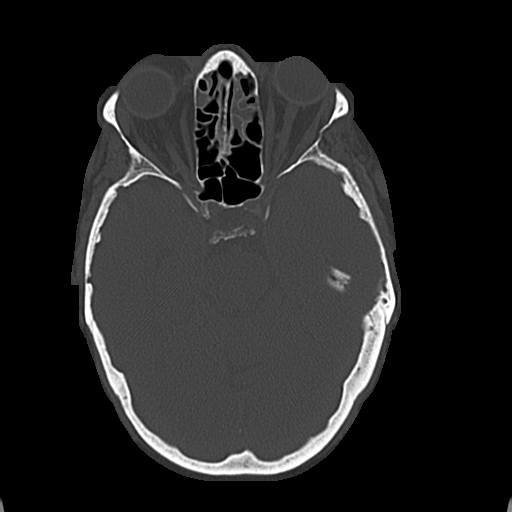
[im 56/84  bone]
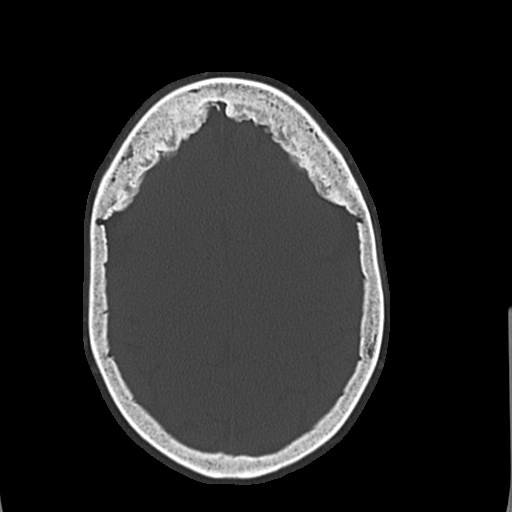

[Series 5: c-spine st · axial · 0.34mm/px · z∈[-173,-113]mm · 2 of 92 slices shown, 3 images]
[im 31/92  soft-tissue]
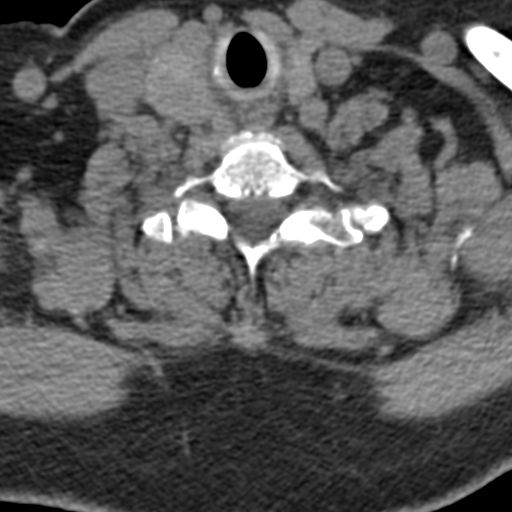
[im 31/92  bone]
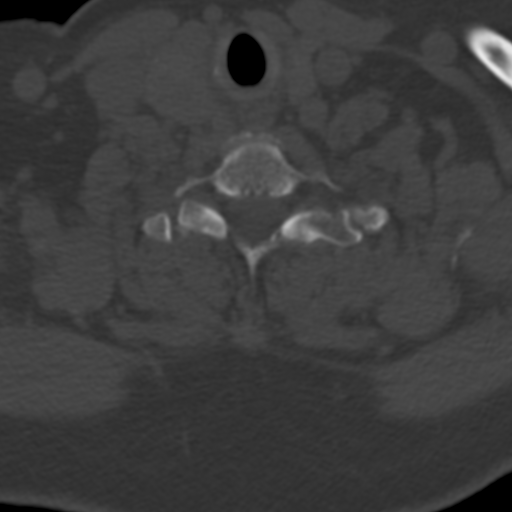
[im 61/92  bone]
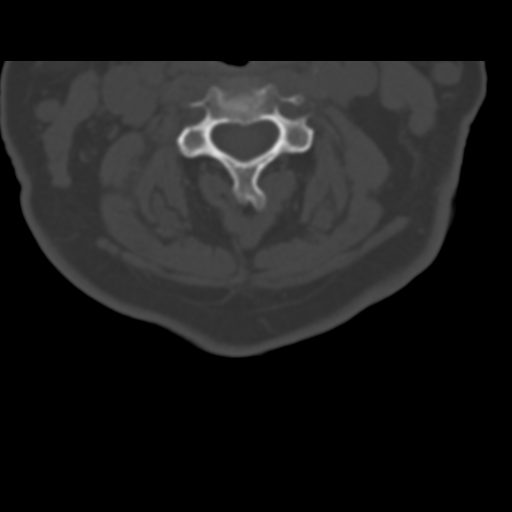

[Series 12: coronal · coronal · 0.23mm/px · 3 of 61 slices shown]
[im 16/61  bone]
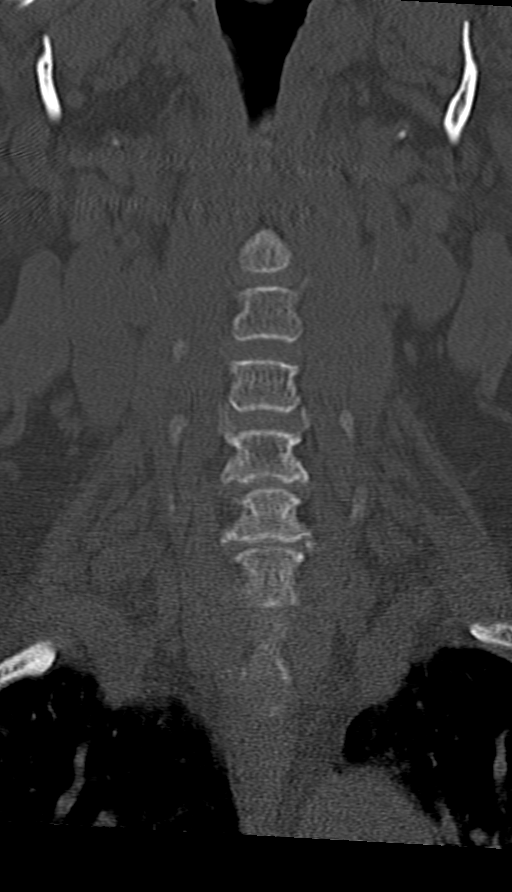
[im 31/61  bone]
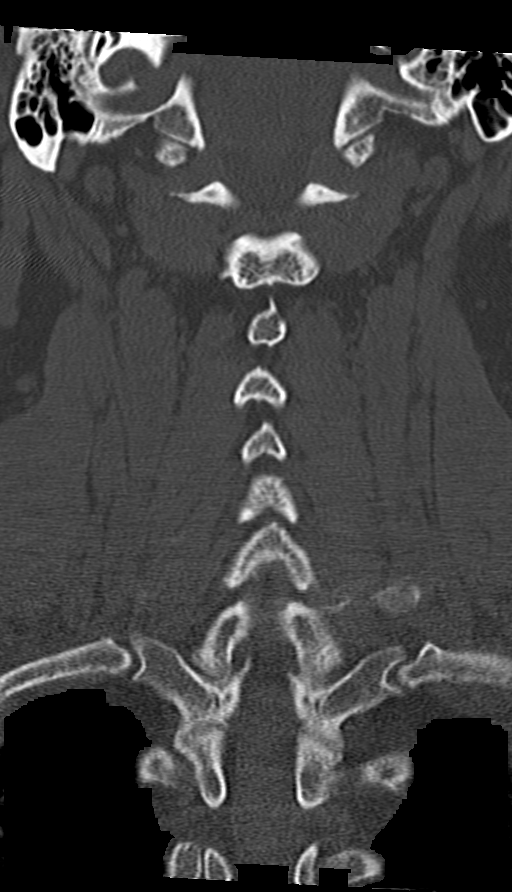
[im 46/61  bone]
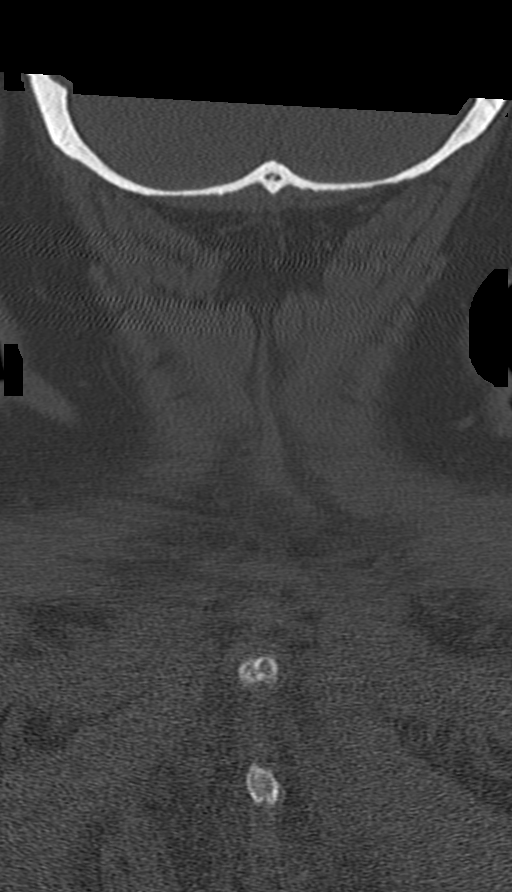

[Series 13: sagittal · sagittal · 0.23mm/px · 5 of 61 slices shown]
[im 11/61  bone]
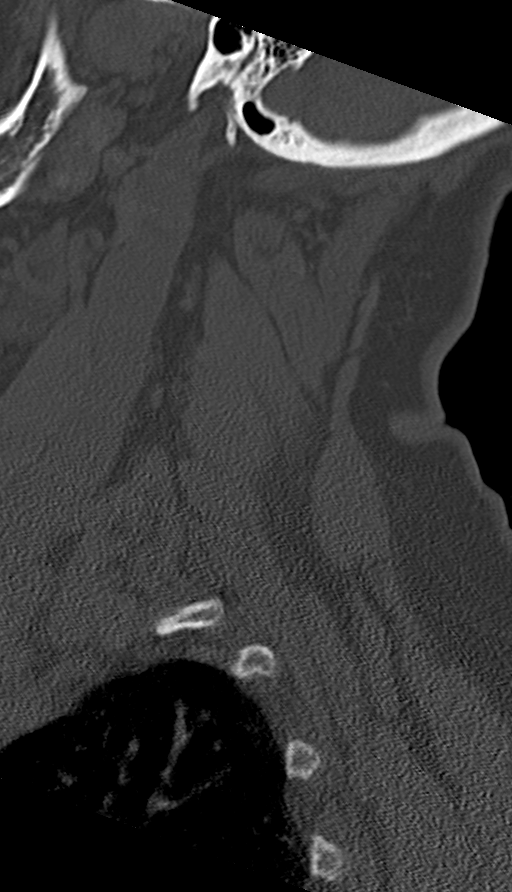
[im 21/61  bone]
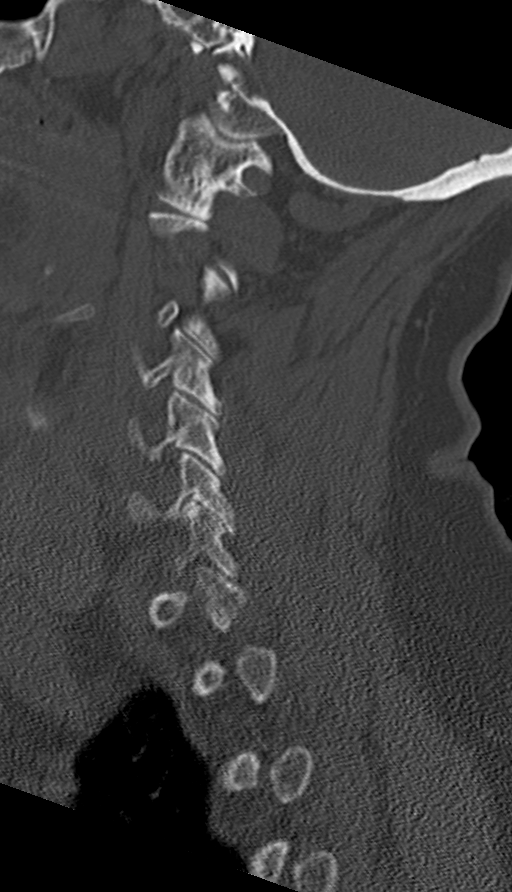
[im 31/61  bone]
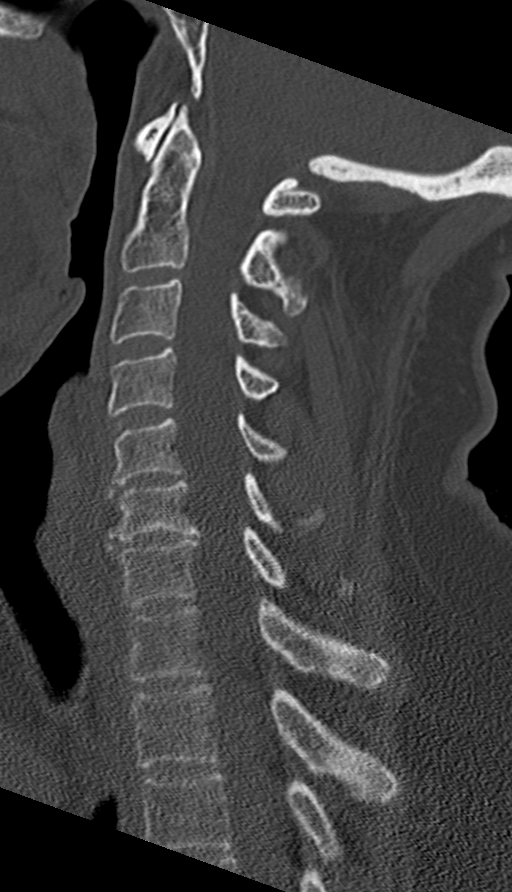
[im 41/61  bone]
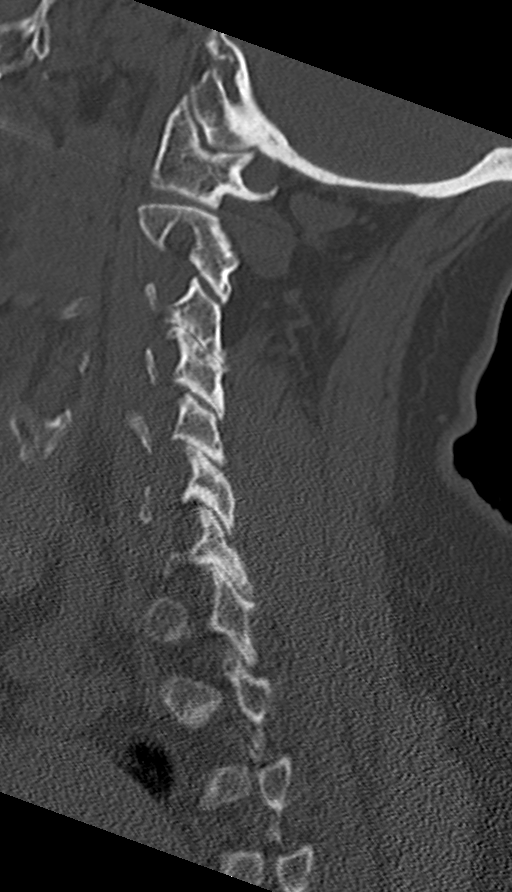
[im 51/61  bone]
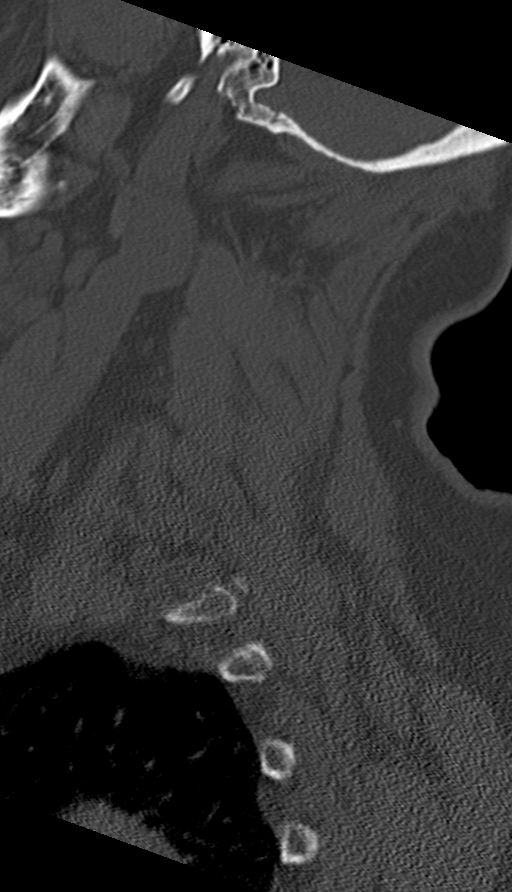

[12 of 33 positions shown; findings below may reference images not displayed]

FINDINGS: CT HEAD FINDINGS

Brain: Mild generalized atrophy. Normal for age minimal chronic
small vessel ischemia. No intracranial hemorrhage, mass effect, or
midline shift. No hydrocephalus. The basilar cisterns are patent. No
evidence of territorial infarct or acute ischemia. No extra-axial or
intracranial fluid collection.

Vascular: Atherosclerosis of skullbase vasculature without
hyperdense vessel or abnormal calcification.

Skull: No fracture or focal lesion.

Sinuses/Orbits: Minimally displaced fractures through the anterior
and lateral walls the left maxillary sinus with associated
hemosinus. Mucosal thickening of left ethmoid air cells. Mastoid air
cells are clear. Bilateral cataract resection.

Other: Small right frontal scalp hematoma.

CT CERVICAL SPINE FINDINGS

Alignment: Normal.

Skull base and vertebrae: No acute fracture. Vertebral body heights
are maintained. The dens and skull base are intact.

Soft tissues and spinal canal: No prevertebral fluid or swelling. No
visible canal hematoma.

Disc levels: Disc space narrowing and endplate spurring at C5-C6 and
C6-C7. Scattered multilevel facet arthropathy.

Upper chest: No acute abnormality. Enlarged right lobe with these
thyroid gland containing small nodules, similar to prior neck CT
06/06/2015.

Other: None.
IMPRESSION: 1.  No acute intracranial abnormality.
2. Acute minimally displaced fracture anterior and lateral walls of
the left maxillary sinus.
3. No fracture or subluxation of the cervical spine.

## 2018-12-11 ENCOUNTER — Other Ambulatory Visit: Payer: 59

## 2018-12-11 ENCOUNTER — Other Ambulatory Visit: Payer: Self-pay

## 2018-12-11 DIAGNOSIS — A048 Other specified bacterial intestinal infections: Secondary | ICD-10-CM

## 2018-12-14 ENCOUNTER — Other Ambulatory Visit: Payer: Self-pay | Admitting: Family Medicine

## 2018-12-14 MED FILL — LOSARTAN POTASSIUM 100 MG T: 100 | 30 days supply | Qty: 30 | Fill #0

## 2018-12-15 MED FILL — FUROSEMIDE 20 MG TABS: 20 | 90 days supply | Qty: 180 | Fill #0

## 2018-12-16 ENCOUNTER — Other Ambulatory Visit: Payer: 59

## 2018-12-18 LAB — HELICOBACTER PYLORI  SPECIAL ANTIGEN
MICRO NUMBER:: 435520
SPECIMEN QUALITY: ADEQUATE

## 2018-12-22 NOTE — Progress Notes (Signed)
Good news,  The infection (H pylori) is gone.  I hope you are feeling well.  Let me know if any questions.  Best regards,  Gatha Mayer, MD, [facility]

## 2019-01-07 MED FILL — HYDROXYCHLOROQUINE SULFATE: 200 | 90 days supply | Qty: 90 | Fill #0

## 2019-01-07 MED FILL — LOSARTAN POTASSIUM 100 MG T: 100 | 30 days supply | Qty: 30 | Fill #0

## 2019-01-13 ENCOUNTER — Other Ambulatory Visit: Payer: 59

## 2019-01-20 DIAGNOSIS — R5382 Chronic fatigue, unspecified: Secondary | ICD-10-CM | POA: Diagnosis not present

## 2019-01-20 DIAGNOSIS — M2550 Pain in unspecified joint: Secondary | ICD-10-CM | POA: Diagnosis not present

## 2019-01-20 DIAGNOSIS — M0609 Rheumatoid arthritis without rheumatoid factor, multiple sites: Secondary | ICD-10-CM | POA: Diagnosis not present

## 2019-01-20 DIAGNOSIS — M5136 Other intervertebral disc degeneration, lumbar region: Secondary | ICD-10-CM | POA: Diagnosis not present

## 2019-01-20 DIAGNOSIS — I7381 Erythromelalgia: Secondary | ICD-10-CM | POA: Diagnosis not present

## 2019-01-20 DIAGNOSIS — M5441 Lumbago with sciatica, right side: Secondary | ICD-10-CM | POA: Diagnosis not present

## 2019-01-20 MED FILL — predniSONE 5 MG TABS: 5 | 12 days supply | Qty: 48 | Fill #0

## 2019-01-21 DIAGNOSIS — M0609 Rheumatoid arthritis without rheumatoid factor, multiple sites: Secondary | ICD-10-CM | POA: Diagnosis not present

## 2019-01-28 ENCOUNTER — Encounter: Payer: Self-pay | Admitting: Internal Medicine

## 2019-01-28 ENCOUNTER — Ambulatory Visit (INDEPENDENT_AMBULATORY_CARE_PROVIDER_SITE_OTHER): Payer: 59 | Admitting: Internal Medicine

## 2019-01-28 ENCOUNTER — Other Ambulatory Visit: Payer: Self-pay

## 2019-01-28 DIAGNOSIS — E042 Nontoxic multinodular goiter: Secondary | ICD-10-CM | POA: Diagnosis not present

## 2019-01-28 DIAGNOSIS — E559 Vitamin D deficiency, unspecified: Secondary | ICD-10-CM

## 2019-01-28 NOTE — Patient Instructions (Signed)
Please restart vitamin D 5000 units daily.  Come back for labs in 1 week after finishing Prednisone.  Please come back for a visit in 4 months.

## 2019-01-28 NOTE — Progress Notes (Signed)
Patient ID: [name], female   DOB: April 11, 1951, 68 y.o.   MRN: [id number]   Patient location: Home My location: Office  Referring Provider: Lucretia Kern, DO  I connected with the patient on 01/28/19 at  2:00 PM EDT by a video enabled telemedicine application and verified that I am speaking with the correct person.   I discussed the limitations of evaluation and management by telemedicine and the availability of in person appointments. The patient expressed understanding and agreed to proceed.   Details of the encounter are shown below.  HPI  [name] is a 68 y.o.-year-old female presenting for follow-up for hypercalcemia/hyperparathyroidism, osteoporosis, vitamin D deficiency.  Last visit 6 months ago. She is the wife of Basma Buchner.  Pt has had hypercalcemia at least since 2014.  Reviewed pertinent labs: Lab Results  Component Value Date   PTH 69 (H) 07/30/2018   PTH 66 (H) 04/03/2017   CALCIUM 10.9 (H) 07/30/2018   CALCIUM 10.0 06/28/2018   CALCIUM 10.7 (H) 06/04/2018   CALCIUM 10.5 01/13/2018   CALCIUM 9.2 07/20/2017   CALCIUM 9.6 07/19/2017   CALCIUM 9.5 07/18/2017   CALCIUM 9.5 07/18/2017   CALCIUM 10.0 07/17/2017   CALCIUM 10.5 07/04/2017  She takes biotin as a part of the B complex for peripheral neuropathy and erythromelalgia.  Calcitriol, magnesium, phosphorus were normal: Component     Latest Ref Rng & Units 07/30/2018  Vitamin D 1, 25 (OH) Total     18 - 72 pg/mL 69  Vitamin D3 1, 25 (OH)     pg/mL 69  Vitamin D2 1, 25 (OH)     pg/mL <8  Magnesium     1.5 - 2.5 mg/dL 1.9  Phosphorus     2.3 - 4.6 mg/dL 2.3   She did not bring back for 24-hour urine collection for calcium.  At last visit I referred her to see Dr. 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 Findings suggestive of multinodular goiter. 2. Nodule #1 meets imaging criteria to recommend percutaneous sampling as clinically indicated. 3. Nodule #5 meets imaging criteria to recommend a 1 year follow-up. 4. Punctate nodules adjacent to the inferior aspect of the left lobe of the thyroid (labeled 7 and 8), both appear external to the thyroid gland and thus may represent non pathologically enlarged cervical lymph nodes versus parathyroid adenomas. Further evaluation with contrast-enhanced neck CT and/or nuclear medicine parathyroid scintigraphy could be performed as indicated.  FNA of the dominant nodule - planned for 01/28/2019 4D CT is pending - planned for 01/28/2019  Osteoporosis:  Reviewed her DXA scan reports: 01/10/2018 ([place]) Lumbar spine L1-L3 (L4) Femoral neck (FN) 33% distal radius  T-score -1.7 RFN: -1.4 LFN: -1.8 Could not be analyzed due to bilateral hardware  Change in BMD from previous DXA test (%) -8.5%* -7.9%* n/a  (*) statistically significant   Date L1-L4 T score FN T score  10/23/2011 ([place]) -1.0 LFN: -1.2 RFN: -1.0  10/05/2007 -1.3 (-15.4%*) LFN: -1.4   She had several previous fractures:  - L wrist fx in 2005 (distal radius and small ulnar styloid avulsion fx). - Right tibial plateau fracture 06/2017 (fell down the stairs) - Right forearm 06/2017 (fell down the stairs) - Left maxillary fracture 06/2017 (fell down the stairs) - Right radial fracture 06/2017 (fell down  the stairs)  No history of kidney stones.  No CKD. Last BUN/Cr: Lab Results  Component Value Date   BUN 13 07/30/2018   BUN 11 06/28/2018   CREATININE 1.00 (H) 07/30/2018   CREATININE 0.86 06/28/2018   She was on HCTZ in the past, but not recently.  Currently on Lasix.  Vitamin D deficiency:  Patient had a very low vitamin D level in 04/2017, but this normalized on 5000 units vitamin D daily - she stopped 2 mo ago...: Lab  Results  Component Value Date   VD25OH 43.86 07/30/2018   VD25OH 42.24 03/04/2018   VD25OH 14.17 (L) 05/05/2017   VD25OH 51 05/14/2013   No family history of hypercalcemia, pituitary tumors, medullary thyroid cancer or osteoporosis.  She has FH of thyroid ds in 2 sisters and 1 brother.   Her older son and younger sister had kidney stones.  Pt. also has a history of RA- on Plaquenil. Started a Prednisone taper - now on 20 mg daily, decreasing. Has HL - on Red Yeast Rice.  ROS: Constitutional: + weight gain/no weight loss, no fatigue, no subjective hyperthermia, + subjective hypothermia Eyes: no blurry vision, no xerophthalmia ENT: no sore throat, no nodules palpated in neck, no dysphagia, no odynophagia, no hoarseness Cardiovascular: no CP/no SOB/no palpitations/no leg swelling Respiratory: no cough/no SOB/no wheezing Gastrointestinal: no N/no V/no D/no C/no acid reflux Musculoskeletal: no muscle aches/no joint aches Skin: no rashes, + hair loss Neurological: no tremors/no numbness/no tingling/no dizziness  I reviewed pt's medications, allergies, PMH, social hx, family hx, and changes were documented in the history of present illness. Otherwise, unchanged from my initial visit note.  Past Medical History:  Diagnosis Date  . Allergy   . Cataract    BILATERAL-REMOVED 2 YEARS AGO  . GERD (gastroesophageal reflux disease)   . Helicobacter pylori gastritis 10/05/2018  . Hx of colonic polyp - ssp 11/03/2014  . Hypercalcemia   . Hypertension   . Left maxillary fracture ([place]) 07/17/2017  . 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 Osteopenia 10/17/2011   DEXA 09/2007: -1.4 L fem; 10/2011: -1.2 L fem   . Osteoporosis   . PONV (postoperative nausea and vomiting)   . Pseudogout of foot   . Rheumatoid arthritis(714.0) dx 2010  . Tibial plateau fracture, right, closed, initial encounter 07/16/2017   Past Surgical History:  Procedure Laterality Date  . BREAST BIOPSY  1972  . Broken wrist   2010  . CATARACT EXTRACTION  03/2012   left  . COLONOSCOPY    . COSMETIC SURGERY    . FRACTURE SURGERY    . INNER EAR SURGERY     busted ear drum  . MAXIMUM ACCESS (MAS)POSTERIOR LUMBAR INTERBODY FUSION (PLIF) 1 LEVEL N/A 10/11/2016   Procedure: Lumbar one-Sacral one Maximum access posterior lumbar interbody fusion;  Surgeon: Erline Levine, MD;  Location: [place];  Service: Neurosurgery;  Laterality: N/A;  . ORIF WRIST FRACTURE Right 07/18/2017   Procedure: OPEN REDUCTION INTERNAL FIXATION (ORIF) WRIST FRACTURE;  Surgeon: Renette Butters, MD;  Location: [place];  Service: Orthopedics;  Laterality: Right;  . pneumonia  2007   Social History   Social History  . Marital status: Married    Spouse name: N/A  . Number of children: 3  . Years of education: N/A   Occupational History  . Retired    Social History Main Topics  . Smoking status: Former Smoker    Types: Cigarettes  . Smokeless tobacco: Former Systems developer  Quit date: 08/19/1981  . Alcohol use No  . Drug use: No   Social History Narrative   Artist -retired Building control surveyor   Married, lives with spouse, [name], he is IT support for [employer]   3 sons   2 caffeinated beverages a day   No regular exercise, diet is ok   Current Outpatient Medications on File Prior to Visit  Medication Sig Dispense Refill  . aspirin 81 MG tablet Take 81 mg by mouth at bedtime.     Candee Furbish Paint Modified 1.5 % LIQD Paint between toes after foot soaks up to 3 times per day. 1 Bottle 2  . furosemide (LASIX) 20 MG tablet TAKE 2 TABLETS BY MOUTH DAILY 180 tablet 0  . gabapentin (NEURONTIN) 300 MG capsule Take 1 capsule (300 mg total) by mouth 2 (two) times daily. 180 capsule 3  . hydroxychloroquine (PLAQUENIL) 200 MG tablet Take 200 mg by mouth daily. Patient is currently taking    . IRON PO Take by mouth.    . losartan (COZAAR) 100 MG tablet TAKE 1 TABLET BY MOUTH ONCE DAILY 90 tablet 0  . metoprolol succinate (TOPROL-XL) 50 MG 24  hr tablet TAKE 2 TABLETS BY MOUTH DAILY. TAKE WITH OR IMMEDIATELY FOLLOWING A MEAL 180 tablet 1  . omeprazole (PRILOSEC) 20 MG capsule Take 1 capsule (20 mg total) by mouth 2 (two) times daily for 14 days. 28 capsule 0  . ranitidine (ZANTAC) 150 MG tablet Take 150 mg by mouth daily as needed for heartburn.     . senna-docusate (SENOKOT-S) 8.6-50 MG tablet Take 1 tablet by mouth 2 (two) times daily.    Marland Kitchen sulfaSALAzine (AZULFIDINE) 500 MG EC tablet TAKE 2 TABLETS BY MOUTH TWICE DAILY 120 tablet 0  . tiZANidine (ZANAFLEX) 4 MG tablet Take 0.5-1 tablets (2-4 mg total) by mouth every 8 (eight) hours as needed for muscle spasms. 20 tablet 0  . Vit B6-Vit B12-Omega 3 Acids (VITAMIN B PLUS+ PO) Take 1 tablet by mouth every evening.     No current facility-administered medications on file prior to visit.    Allergies  Allergen Reactions  . Amlodipine Besylate Other (See Comments)    Tremors  . Nortriptyline Other (See Comments)    tremors  . Dilaudid [Hydromorphone Hcl]     Headache, muscle tightness   Family History  Problem Relation Age of Onset  . Stroke Mother   . Hypertension Father   . Heart attack Father   . Hypertension Sister        x 3  . Hypertension Brother   . Hyperthyroidism Sister        x2, s/p RAI ablation  . Hypothyroidism Brother   . Colon cancer Neg Hx   . Esophageal cancer Neg Hx   . Rectal cancer Neg Hx   . Stomach cancer Neg Hx    PE: There were no vitals taken for this visit. Wt Readings from Last 3 Encounters:  10/29/18 243 lb 8 oz (110.5 kg)  09/30/18 243 lb (110.2 kg)  09/16/18 243 lb 12.8 oz (110.6 kg)   Constitutional:  in NAD  The physical exam was not performed (virtual visit).  Assessment: 1. Hypercalcemia/hyperparathyroidism  2.  Osteopenia + fragility fractures = osteoporosis  3.  Vitamin D deficiency  4.  Multinodular goiter  Plan: Patient has a history of elevated calcium, with the highest level being at 10.9. A parathyroid was also  elevated, 69. She also has a history  of low vitamin D which normalized last year, after which we proceeded with parathyroid investigation.  Calcitriol, magnesium, phosphorus, and 24-hour urine calcium were all normal. -She does not have a history of nephrolithiasis but does have osteoporosis (osteopenia on bone density scans + history of fragility fractures).  No abdominal pain, depression. We did discuss about possible consequences of hyperparathyroidism to include kidney stones and worsening osteoporosis and she agreed for referral to surgery at last visit -She saw Dr. Harlow Asa in consultation in 09/2018.  At that time, a parathyroid scan showed a right superior parathyroid adenoma, however, this was not showed on a subsequent ultrasound.  The ultrasound showed an inferior left possible parathyroid mass.  It also showed right thyroid nodules and it was possible that these could have confounded the results of her parathyroid scan.  1 of the thyroid nodules was large, 3.7 cm and an FNA is planned for a week from now.  Plan is to proceed with a 4D CT to further investigate her parathyroid glands before possible surgery. -I will see the patient back in 4 months.  2.  Osteoporosis -Patient has osteopenia on bone density scan, however, due to many fragility fractures, she has a clinical diagnosis of osteoporosis.  Most recent fracture was a right tibial one in 06/2017 -Her latest bone density scan was from early 2019: Worse, but scores were still in the osteopenic range. -We discussed about treating her hyperparathyroidism first and then repeating a bone density scan to see if this improves.  If not, she will need antiresorptive medicines  3.  Vitamin D deficiency -Patient has a history of very low vitamin D level in 04/2018, we started 5000 units vitamin D daily. -Repeat vitamin D level was normal in 02/2018 on the above dose -Unfortunately, she stopped her vitamin D approximately 2 months ago >> strongly  advised her to restart right away.  Discussed that she may develop hypocalcemia if vitamin D is insufficient at the time of her parathyroid surgery.  4.  Multinodular goiter - new -See thyroid ultrasound from 09/2018:  Largest 3.7 cm -She has a biopsy pending for this in 1 week -No neck compression symptoms -We will continue to follow the dominant nodule and the rest of the thyroid nodules by repeating an ultrasound next year provided that the biopsy is benign -Latest TSH was reviewed and this was normal in 2019.  We will have her come back for another test after she finishes her prednisone taper  Orders Placed This Encounter  Procedures  . TSH  . T4, free  . VITAMIN D 25 Hydroxy (Vit-D Deficiency, Fractures)   Philemon Kingdom, MD PhD [facility]

## 2019-02-04 ENCOUNTER — Ambulatory Visit
Admission: RE | Admit: 2019-02-04 | Discharge: 2019-02-04 | Disposition: A | Discharge status: Home / Self Care | Payer: 59 | Source: Ambulatory Visit | Attending: Surgery | Admitting: Surgery

## 2019-02-04 ENCOUNTER — Other Ambulatory Visit (HOSPITAL_COMMUNITY)
Admission: RE | Admit: 2019-02-04 | Discharge: 2019-02-04 | Disposition: A | Discharge status: Home / Self Care | Payer: 59 | Source: Ambulatory Visit | Attending: Diagnostic Radiology | Admitting: Diagnostic Radiology

## 2019-02-04 ENCOUNTER — Other Ambulatory Visit: Payer: Self-pay

## 2019-02-04 DIAGNOSIS — E041 Nontoxic single thyroid nodule: Secondary | ICD-10-CM | POA: Insufficient documentation

## 2019-02-04 DIAGNOSIS — E042 Nontoxic multinodular goiter: Secondary | ICD-10-CM | POA: Diagnosis not present

## 2019-02-04 DIAGNOSIS — E0789 Other specified disorders of thyroid: Secondary | ICD-10-CM | POA: Diagnosis not present

## 2019-02-04 DIAGNOSIS — E210 Primary hyperparathyroidism: Secondary | ICD-10-CM

## 2019-02-04 MED ORDER — IOPAMIDOL (ISOVUE-300) INJECTION 61%
100.0000 mL | Freq: Once | INTRAVENOUS | Status: AC | PRN
  Administered 2019-02-04: 75 mL via INTRAVENOUS

## 2019-02-09 ENCOUNTER — Encounter: Payer: Self-pay | Admitting: Family Medicine

## 2019-02-09 ENCOUNTER — Other Ambulatory Visit: Payer: Self-pay

## 2019-02-09 ENCOUNTER — Ambulatory Visit (INDEPENDENT_AMBULATORY_CARE_PROVIDER_SITE_OTHER): Payer: 59 | Admitting: Family Medicine

## 2019-02-09 DIAGNOSIS — L03115 Cellulitis of right lower limb: Secondary | ICD-10-CM | POA: Diagnosis not present

## 2019-02-09 DIAGNOSIS — E785 Hyperlipidemia, unspecified: Secondary | ICD-10-CM

## 2019-02-09 DIAGNOSIS — I10 Essential (primary) hypertension: Secondary | ICD-10-CM | POA: Diagnosis not present

## 2019-02-09 DIAGNOSIS — R739 Hyperglycemia, unspecified: Secondary | ICD-10-CM

## 2019-02-09 DIAGNOSIS — E041 Nontoxic single thyroid nodule: Secondary | ICD-10-CM | POA: Diagnosis not present

## 2019-02-09 MED ORDER — DOXYCYCLINE HYCLATE 100 MG PO TABS: 100.0000 mg | ORAL_TABLET | Freq: Two times a day (BID) | ORAL | 0 refills | Status: DC

## 2019-02-09 MED ORDER — METOPROLOL SUCCINATE ER 50 MG PO TB24: ORAL_TABLET | ORAL | 3 refills | Status: DC

## 2019-02-09 MED ORDER — LOSARTAN POTASSIUM 100 MG PO TABS: 100.0000 mg | ORAL_TABLET | Freq: Every day | ORAL | 3 refills | Status: DC

## 2019-02-09 MED FILL — LOSARTAN POTASSIUM 100 MG T: 100 | 90 days supply | Qty: 90 | Fill #0

## 2019-02-09 MED FILL — DOXYCYCLINE HYCLATE 100 MG: 100 | 5 days supply | Qty: 10 | Fill #0

## 2019-02-09 MED FILL — METOPROLOL SUCCINATE ER 50: 50 | 90 days supply | Qty: 180 | Fill #0

## 2019-02-09 NOTE — Patient Instructions (Signed)
-  use the antibiotic Doxycycline for the leg. Please follow up if this is worsening or does not respond to the treatment. Please avoid excessive icing as we discussed. Recommend cooling packs with towel between the pack and you skin.  -eat a health low sugar and low carb diet. Get regular activity as much as possible.  -get the labs as planned  -physical exam with Dr. Ethlyn Gallery in 1-2 months  -follow up in 4 months

## 2019-02-09 NOTE — Progress Notes (Signed)
Virtual Visit via Video Note  I connected with [name]  on 02/09/19 at  3:00 PM EDT by a video enabled telemedicine application and verified that I am speaking with the correct person using two identifiers.  Location patient: home Location provider:work or home office Persons participating in the virtual visit: patient, provider  I discussed the limitations of evaluation and management by telemedicine and the availability of in person appointments. The patient expressed understanding and agreed to proceed.   HPI:  [name] is a pleasant 68 y.o. here for follow up. Chronic medical problems summarized below were reviewed for changes. Unfortunately, her feet are feeling worse again for the last 1-1.5 weeks. Reports has had increasing redness and pain on the R foot and up into the ant leg. She has had cellulitis several time in the past. Denies fevers, CP, SOB, DOE, treatment intolerance or new symptoms.   HTN/HLD/MOrbid obesity/LE edema: -lasix 40mg  (60mg  some days if the swelling is bad), losartan, toprol XL -refuses medication for cholesterol  Osteoporosis (osteopenia +fractures)/hypoparathyroidism/Vit D Def: -seeing Dr. Cruzita Lederer and now surgery as well  Chronic low back pain: -sees Dr. Vertell Limber, s/p spinal fusion surgery  RA: -sees rheumatolgist for management (Dr. Estanislado Pandy) -meds: sulfasalazone, plaquenil  Neuropathy/Erythromelagia/LE edema: -chronic LE pain -has seen [place] specialist (neurology-Dr. Posey Pronto, Rheumatology, vascular, podiatry, dermatology) -meds: gabapentin  GERD: -takes ranitidine and omeprazole   ROS: See pertinent positives and negatives per HPI.  Past Medical History:  Diagnosis Date  . Allergy   . Cataract    BILATERAL-REMOVED 2 YEARS AGO  . GERD (gastroesophageal reflux disease)   . Helicobacter pylori gastritis 10/05/2018  . Hx of colonic polyp - ssp 11/03/2014  . Hypercalcemia   . Hypertension   . Left maxillary fracture ([place]) 07/17/2017   . 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 Osteopenia 10/17/2011   DEXA 09/2007: -1.4 L fem; 10/2011: -1.2 L fem   . Osteoporosis   . PONV (postoperative nausea and vomiting)   . Pseudogout of foot   . Rheumatoid arthritis(714.0) dx 2010  . Tibial plateau fracture, right, closed, initial encounter 07/16/2017    Past Surgical History:  Procedure Laterality Date  . BREAST BIOPSY  1972  . Broken wrist  2010  . CATARACT EXTRACTION  03/2012   left  . COLONOSCOPY    . COSMETIC SURGERY    . FRACTURE SURGERY    . INNER EAR SURGERY     busted ear drum  . MAXIMUM ACCESS (MAS)POSTERIOR LUMBAR INTERBODY FUSION (PLIF) 1 LEVEL N/A 10/11/2016   Procedure: Lumbar one-Sacral one Maximum access posterior lumbar interbody fusion;  Surgeon: Erline Levine, MD;  Location: [place];  Service: Neurosurgery;  Laterality: N/A;  . ORIF WRIST FRACTURE Right 07/18/2017   Procedure: OPEN REDUCTION INTERNAL FIXATION (ORIF) WRIST FRACTURE;  Surgeon: Renette Butters, MD;  Location: [place];  Service: Orthopedics;  Laterality: Right;  . pneumonia  2007    Family History  Problem Relation Age of Onset  . Stroke Mother   . Hypertension Father   . Heart attack Father   . Hypertension Sister        x 3  . Hypertension Brother   . Hyperthyroidism Sister        x2, s/p RAI ablation  . Hypothyroidism Brother   . Colon cancer Neg Hx   . Esophageal cancer Neg Hx   . Rectal cancer Neg Hx   . Stomach cancer Neg Hx     SOCIAL HX: see hpi  Current Outpatient Medications:  .  aspirin 81 MG tablet, Take 81 mg by mouth at bedtime. , Disp: , Rfl:  .  Castellani Paint Modified 1.5 % LIQD, Paint between toes after foot soaks up to 3 times per day., Disp: 1 Bottle, Rfl: 2 .  furosemide (LASIX) 20 MG tablet, TAKE 2 TABLETS BY MOUTH DAILY, Disp: 180 tablet, Rfl: 0 .  gabapentin (NEURONTIN) 300 MG capsule, Take 1 capsule (300 mg total) by mouth 2 (two) times daily., Disp: 180 capsule, Rfl: 3 .  hydroxychloroquine (PLAQUENIL) 200  MG tablet, Take 200 mg by mouth daily. Patient is currently taking, Disp: , Rfl:  .  IRON PO, Take by mouth., Disp: , Rfl:  .  losartan (COZAAR) 100 MG tablet, Take 1 tablet (100 mg total) by mouth daily., Disp: 90 tablet, Rfl: 3 .  metoprolol succinate (TOPROL-XL) 50 MG 24 hr tablet, TAKE 2 TABLETS BY MOUTH DAILY. TAKE WITH OR IMMEDIATELY FOLLOWING A MEAL, Disp: 180 tablet, Rfl: 3 .  ranitidine (ZANTAC) 150 MG tablet, Take 150 mg by mouth daily as needed for heartburn. , Disp: , Rfl:  .  senna-docusate (SENOKOT-S) 8.6-50 MG tablet, Take 1 tablet by mouth 2 (two) times daily., Disp: , Rfl:  .  sulfaSALAzine (AZULFIDINE) 500 MG EC tablet, TAKE 2 TABLETS BY MOUTH TWICE DAILY, Disp: 120 tablet, Rfl: 0 .  tiZANidine (ZANAFLEX) 4 MG tablet, Take 0.5-1 tablets (2-4 mg total) by mouth every 8 (eight) hours as needed for muscle spasms., Disp: 20 tablet, Rfl: 0 .  Vit B6-Vit B12-Omega 3 Acids (VITAMIN B PLUS+ PO), Take 1 tablet by mouth every evening., Disp: , Rfl:  .  doxycycline (VIBRA-TABS) 100 MG tablet, Take 1 tablet (100 mg total) by mouth 2 (two) times daily., Disp: 10 tablet, Rfl: 0 .  omeprazole (PRILOSEC) 20 MG capsule, Take 1 capsule (20 mg total) by mouth 2 (two) times daily for 14 days., Disp: 28 capsule, Rfl: 0  EXAM:  VITALS per patient if applicable:   GENERAL: alert, oriented, appears well and in no acute distress  HEENT: atraumatic, conjunttiva clear, no obvious abnormalities on inspection of external nose and ears  NECK: normal movements of the head and neck  LUNGS: on inspection no signs of respiratory distress, breathing rate appears normal, no obvious gross SOB, gasping or wheezing  CV: no obvious cyanosis  SKIN: erythema and edema of the skin bilat feet and ankles, increased fairly demarcated area of erythema on the R ant leg and dorsal foot  MS: moves all visible extremities without noticeable abnormality  PSYCH/NEURO: pleasant and cooperative, no obvious depression or  anxiety, speech and thought processing grossly intact  ASSESSMENT AND PLAN:  Discussed the following assessment and plan:  Morbid obesity ([place])  Hyperglycemia  Hyperlipidemia, unspecified hyperlipidemia type - Plan: Lipid panel -labs -lifestyle recs  Essential hypertension - Plan: Basic metabolic panel -lab per orders, cont current medication -monitor BP at home  Cellulitis/LE edema  -tx with doxy 100mg  bid -elevation -increase lasix to 60mg  for 3 days -she is icing her feet excessively and I have advised her that this could be causing damage to the skin and tissues and advised safer cooling/icing habits.  -advised to follow up if worsening or does not improve back to baseline with treatment  I discussed the assessment and treatment plan with the patient. The patient was provided an opportunity to ask questions and all were answered. The patient agreed with the plan and demonstrated an understanding of the instructions.  The patient was advised to call back or seek an in-person evaluation if the symptoms worsen or if the condition fails to improve as anticipated.   Follow up instructions: Advised assistant Wendie Simmer to help patient arrange the following: -lab visit -advised CPE with Dr. Ethlyn Gallery in 1-2 months -follow up with Dr. Maudie Mercury in 3-4 months  Lucretia Kern, DO

## 2019-02-10 ENCOUNTER — Other Ambulatory Visit: Payer: Self-pay

## 2019-02-10 ENCOUNTER — Telehealth: Payer: Self-pay | Admitting: *Deleted

## 2019-02-10 ENCOUNTER — Other Ambulatory Visit (INDEPENDENT_AMBULATORY_CARE_PROVIDER_SITE_OTHER): Payer: 59

## 2019-02-10 DIAGNOSIS — E042 Nontoxic multinodular goiter: Secondary | ICD-10-CM

## 2019-02-10 DIAGNOSIS — E559 Vitamin D deficiency, unspecified: Secondary | ICD-10-CM

## 2019-02-10 LAB — BASIC METABOLIC PANEL
BUN: 14 mg/dL (ref 6–23)
CO2: 33 mEq/L — ABNORMAL HIGH (ref 19–32)
Calcium: 10.4 mg/dL (ref 8.4–10.5)
Chloride: 99 mEq/L (ref 96–112)
Creatinine, Ser: 0.93 mg/dL (ref 0.40–1.20)
GFR: 59.58 mL/min — ABNORMAL LOW (ref >60.00)
Glucose, Bld: 93 mg/dL (ref 70–99)
Potassium: 4.1 mEq/L (ref 3.5–5.1)
Sodium: 139 mEq/L (ref 135–145)

## 2019-02-10 LAB — LIPID PANEL
Cholesterol: 220 mg/dL — ABNORMAL HIGH (ref 0–200)
HDL: 43.1 mg/dL (ref >39.00)
LDL Cholesterol: 146 mg/dL — ABNORMAL HIGH (ref 0–99)
NonHDL: 176.55
Total CHOL/HDL Ratio: 5
Triglycerides: 153 mg/dL — ABNORMAL HIGH (ref 0.0–149.0)
VLDL: 30.6 mg/dL (ref 0.0–40.0)

## 2019-02-10 LAB — T4, FREE: Free T4: 1.01 ng/dL (ref 0.60–1.60)

## 2019-02-10 LAB — TSH: TSH: 1.77 u[IU]/mL (ref 0.35–4.50)

## 2019-02-10 LAB — VITAMIN D 25 HYDROXY (VIT D DEFICIENCY, FRACTURES): VITD: 36.01 ng/mL (ref 30.00–100.00)

## 2019-02-10 NOTE — Telephone Encounter (Signed)
-----   Message from Lucretia Kern, DO sent at 02/09/2019  6:12 PM EDT ----- -lab visit-advised CPE with Dr. Ethlyn Gallery in 1-2 months-follow up with Dr. Maudie Mercury in 3-4 months

## 2019-02-10 NOTE — Telephone Encounter (Signed)
I called the pt to schedule appts as below and she stated she has a lab appt today.  Patient stated she will call back for appts as below.

## 2019-02-17 ENCOUNTER — Encounter (HOSPITAL_COMMUNITY): Payer: Self-pay

## 2019-02-25 ENCOUNTER — Other Ambulatory Visit: Payer: Self-pay | Admitting: Neurology

## 2019-02-25 MED FILL — GABAPENTIN 300 MG CAPSULE: 300 | 90 days supply | Qty: 180 | Fill #0

## 2019-03-03 MED FILL — HYDROXYCHLOROQUINE 200 MG T: 200 | 90 days supply | Qty: 180 | Fill #0

## 2019-03-05 ENCOUNTER — Ambulatory Visit: Payer: Self-pay | Admitting: Surgery

## 2019-03-05 DIAGNOSIS — E210 Primary hyperparathyroidism: Secondary | ICD-10-CM | POA: Diagnosis not present

## 2019-03-05 DIAGNOSIS — E041 Nontoxic single thyroid nodule: Secondary | ICD-10-CM | POA: Diagnosis not present

## 2019-03-10 DIAGNOSIS — I10 Essential (primary) hypertension: Secondary | ICD-10-CM | POA: Diagnosis not present

## 2019-03-10 DIAGNOSIS — M545 Low back pain: Secondary | ICD-10-CM | POA: Diagnosis not present

## 2019-03-10 DIAGNOSIS — Z6841 Body Mass Index (BMI) 40.0 and over, adult: Secondary | ICD-10-CM | POA: Diagnosis not present

## 2019-03-10 DIAGNOSIS — R200 Anesthesia of skin: Secondary | ICD-10-CM | POA: Diagnosis not present

## 2019-03-10 DIAGNOSIS — G629 Polyneuropathy, unspecified: Secondary | ICD-10-CM | POA: Diagnosis not present

## 2019-03-15 ENCOUNTER — Other Ambulatory Visit: Payer: Self-pay | Admitting: Family Medicine

## 2019-03-15 MED FILL — FUROSEMIDE 20 MG TABS: 20 | 90 days supply | Qty: 180 | Fill #0

## 2019-04-06 NOTE — Patient Instructions (Addendum)
YOU NEED TO HAVE A COVID 19 TEST ON 04-08-2019 @ 2:00 PM, THIS TEST MUST BE DONE BEFORE SURGERY, COME  [address], [place] , [id number]. ONCE YOUR COVID TEST IS COMPLETED, PLEASE BEGIN THE QUARANTINE INSTRUCTIONS AS OUTLINED IN YOUR HANDOUT.                 [name]    Your procedure is scheduled on: 04-12-2019    Report to [facility] Main  Entrance    Report to admitting at 7:30 AM   1 VISITOR IS ALLOWED TO WAIT IN WAITING ROOM  ONLY DAY OF YOUR SURGERY. NO VISITORS ARE ALLOWED IN SHORT STAY OR RECOVERY ROOM.   Call this number if you have problems the morning of surgery [phone]    Remember: Do not eat food or drink liquids :After Midnight. BRUSH YOUR TEETH MORNING OF SURGERY AND RINSE YOUR MOUTH OUT, NO CHEWING GUM CANDY OR MINTS.     Take these medicines the morning of surgery with A SIP OF WATER: Metoprolol                               You may not have any metal on your body including hair pins and              piercings  Do not wear jewelry, make-up, lotions, powders or perfumes, deodorant             Do not wear nail polish.  Do not shave  48 hours prior to surgery.            Do not bring valuables to the hospital. [place].  Contacts, dentures or bridgework may not be worn into surgery.  Leave suitcase in the car. After surgery it may be brought to your room.     Patients discharged the day of surgery will not be allowed to drive home. IF YOU ARE HAVING SURGERY AND GOING HOME THE SAME DAY, YOU MUST HAVE AN ADULT TO DRIVE YOU HOME AND BE WITH YOU FOR 24 HOURS. YOU MAY GO HOME BY TAXI OR UBER OR ORTHERWISE, BUT AN ADULT MUST ACCOMPANY YOU HOME AND STAY WITH YOU FOR 24 HOURS.  Name and phone number of your driver:  Special Instructions: N/A              Please read over the following fact sheets you were given: _____________________________________________________________________         [facility] - Preparing for Surgery Before surgery, you can play an important role.  Because skin is not sterile, your skin needs to be as free of germs as possible.  You can reduce the number of germs on your skin by washing with CHG (chlorahexidine gluconate) soap before surgery.  CHG is an antiseptic cleaner which kills germs and bonds with the skin to continue killing germs even after washing. Please DO NOT use if you have an allergy to CHG or antibacterial soaps.  If your skin becomes reddened/irritated stop using the CHG and inform your nurse when you arrive at Short Stay. Do not shave (including legs and underarms) for at least 48 hours prior to the first CHG shower.  You may shave your face/neck. Please follow these instructions carefully:  1.  Shower with CHG Soap the night before surgery  and the  morning of Surgery.  2.  If you choose to wash your hair, wash your hair first as usual with your  normal  shampoo.  3.  After you shampoo, rinse your hair and body thoroughly to remove the  shampoo.                           4.  Use CHG as you would any other liquid soap.  You can apply chg directly  to the skin and wash                       Gently with a scrungie or clean washcloth.  5.  Apply the CHG Soap to your body ONLY FROM THE NECK DOWN.   Do not use on face/ open                           Wound or open sores. Avoid contact with eyes, ears mouth and genitals (private parts).                       Wash face,  Genitals (private parts) with your normal soap.             6.  Wash thoroughly, paying special attention to the area where your surgery  will be performed.  7.  Thoroughly rinse your body with warm water from the neck down.  8.  DO NOT shower/wash with your normal soap after using and rinsing off  the CHG Soap.                9.  Pat yourself dry with a clean towel.            10.  Wear clean pajamas.            11.  Place clean sheets on your bed the night of your first shower  and do not  sleep with pets. Day of Surgery : Do not apply any lotions/deodorants the morning of surgery.  Please wear clean clothes to the hospital/surgery center.  FAILURE TO FOLLOW THESE INSTRUCTIONS MAY RESULT IN THE CANCELLATION OF YOUR SURGERY PATIENT SIGNATURE_________________________________  NURSE SIGNATURE__________________________________  ________________________________________________________________________

## 2019-04-07 ENCOUNTER — Encounter (HOSPITAL_COMMUNITY)
Admission: RE | Admit: 2019-04-07 | Discharge: 2019-04-07 | Disposition: A | Discharge status: Home / Self Care | Payer: 59 | Source: Ambulatory Visit | Attending: Surgery | Admitting: Surgery

## 2019-04-07 ENCOUNTER — Other Ambulatory Visit: Payer: Self-pay

## 2019-04-07 ENCOUNTER — Encounter (HOSPITAL_COMMUNITY): Payer: Self-pay

## 2019-04-07 DIAGNOSIS — Z20828 Contact with and (suspected) exposure to other viral communicable diseases: Secondary | ICD-10-CM | POA: Diagnosis not present

## 2019-04-07 DIAGNOSIS — Z01812 Encounter for preprocedural laboratory examination: Secondary | ICD-10-CM | POA: Insufficient documentation

## 2019-04-07 DIAGNOSIS — E210 Primary hyperparathyroidism: Secondary | ICD-10-CM | POA: Insufficient documentation

## 2019-04-07 HISTORY — DX: Polyneuropathy, unspecified: G62.9

## 2019-04-07 HISTORY — DX: Zoster without complications: B02.9

## 2019-04-07 HISTORY — DX: Erythromelalgia: I73.81

## 2019-04-07 LAB — BASIC METABOLIC PANEL
Anion gap: 9 (ref 5–15)
BUN: 15 mg/dL (ref 8–23)
CO2: 29 mmol/L (ref 22–32)
Calcium: 10.6 mg/dL — ABNORMAL HIGH (ref 8.9–10.3)
Chloride: 102 mmol/L (ref 98–111)
Creatinine, Ser: 1.08 mg/dL — ABNORMAL HIGH (ref 0.44–1.00)
GFR calc Af Amer: >60 mL/min (ref >60)
GFR calc non Af Amer: 52 mL/min — ABNORMAL LOW (ref >60)
Glucose, Bld: 129 mg/dL — ABNORMAL HIGH (ref 70–99)
Potassium: 4.1 mmol/L (ref 3.5–5.1)
Sodium: 140 mmol/L (ref 135–145)

## 2019-04-07 LAB — CBC
HCT: 45.6 % (ref 36.0–46.0)
Hemoglobin: 15.1 g/dL — ABNORMAL HIGH (ref 12.0–15.0)
MCH: 29.1 pg (ref 26.0–34.0)
MCHC: 33.1 g/dL (ref 30.0–36.0)
MCV: 87.9 fL (ref 80.0–100.0)
Platelets: 212 10*3/uL (ref 150–400)
RBC: 5.19 MIL/uL — ABNORMAL HIGH (ref 3.87–5.11)
RDW: 12.7 % (ref 11.5–15.5)
WBC: 5.3 10*3/uL (ref 4.0–10.5)
nRBC: 0 % (ref 0.0–0.2)

## 2019-04-07 NOTE — Progress Notes (Signed)
Patient present with bight red hue to bilateral feet. 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 APP Janett Billow made aware and feet were assessed by APP. Per APP no significant concern for upcoming surgery.

## 2019-04-08 ENCOUNTER — Other Ambulatory Visit (HOSPITAL_COMMUNITY)
Admission: RE | Admit: 2019-04-08 | Discharge: 2019-04-08 | Disposition: A | Discharge status: Home / Self Care | Payer: 59 | Source: Ambulatory Visit | Attending: Surgery | Admitting: Surgery

## 2019-04-08 DIAGNOSIS — E210 Primary hyperparathyroidism: Secondary | ICD-10-CM | POA: Diagnosis not present

## 2019-04-08 DIAGNOSIS — Z20828 Contact with and (suspected) exposure to other viral communicable diseases: Secondary | ICD-10-CM | POA: Diagnosis not present

## 2019-04-08 DIAGNOSIS — Z01812 Encounter for preprocedural laboratory examination: Secondary | ICD-10-CM | POA: Diagnosis not present

## 2019-04-08 LAB — SARS CORONAVIRUS 2 (TAT 6-24 HRS): SARS Coronavirus 2: NEGATIVE

## 2019-04-11 ENCOUNTER — Encounter (HOSPITAL_COMMUNITY): Payer: Self-pay | Admitting: Surgery

## 2019-04-11 DIAGNOSIS — E042 Nontoxic multinodular goiter: Secondary | ICD-10-CM | POA: Diagnosis present

## 2019-04-11 NOTE — H&P (Signed)
General Surgery [facility]  [name] DOB: 1951-06-14 Married / Language: English / Race: White Female   History of Present Illness  The patient is a 68 year old female who presents with primary hyperparathyroidism.  CHIEF COMPLAINT: primary hyperparathyroidism  Patient returns for follow-up having undergone extensive evaluation for primary hyperparathyroidism. At my request she underwent an ultrasound of the thyroid. This demonstrated 2 small nodules on the left side towards the inferior pole which were candidates for parathyroid adenoma. However it also demonstrated a dominant nodule in the right thyroid lobe and biopsy was recommended. Patient underwent fine-needle aspiration biopsy which showed atypia. Molecular genetic testing was performed in rendered a benign result. We proceeded then with nuclear medicine parathyroid scan which suggested a right sided parathyroid adenoma. In hindsight, this probably correlated more with the right thyroid nodule. Finally on February 04, 2019 she underwent a 4D CT scan of the neck to evaluate for parathyroid adenoma. Again a left-sided nodule measuring 7 mm was favored to represent a parathyroid adenoma. Patient returns today to review these results and to discuss surgery for parathyroidectomy.   Problem List/Past Medical PRIMARY HYPERPARATHYROIDISM (E21.0)   Past Surgical History Breast Augmentation  Bilateral. Cataract Surgery  Bilateral. Oral Surgery   Diagnostic Studies History Colonoscopy  1-5 years ago Mammogram  within last year Pap Smear  1-5 years ago  Allergies amLODIPine Besy-Benazepril HCl *ANTIHYPERTENSIVES*  amLODIPine Besylate *Calcium Channel Blockers**  tremors Nortriptyline HCl *ANTIDEPRESSANTS*  tremors Dilaudid *ANALGESICS - OPIOID*  Headache. muscle tightness Allergies Reconciled   Medication History  Benzonatate (100MG  Capsule, Oral) Active. Castellani Paint Modified  (1.5% Liquid, External) Active. Fluticasone Propionate (50MCG/ACT Suspension, Nasal) Active. Furosemide (20MG  Tablet, Oral) Active. Gabapentin (300MG  Capsule, Oral) Active. Hydroxychloroquine Sulfate (200MG  Tablet, Oral) Active. Losartan Potassium (100MG  Tablet, Oral) Active. Metoprolol Succinate ER (50MG  Tablet ER 24HR, Oral) Active. sulfaSALAzine (500MG  Tablet, Oral) Active. Aspirin (81MG  Tablet DR, Oral) Active. Vitamin D3 (5000UNIT Capsule, Oral) Active. Iron (325 (65 Fe)MG Tablet, Oral) Active. Zantac (150MG  Tablet, Oral) Active. Senna (8.6-50MG  Tablet, Oral) Active. Zanaflex (4MG  Capsule, Oral) Active. B Complex Vitamins Plus (Oral) Active. Medications Reconciled  Social History Alcohol use  Remotely quit alcohol use. Caffeine use  Carbonated beverages, Coffee, Tea. No drug use  Tobacco use  Former smoker.  Family History  Arthritis  Brother. Colon Polyps  Father. Hypertension  Father.  Pregnancy / Birth History Age at menarche  64 years. Age of menopause  <43 Gravida  3 Length (months) of breastfeeding  7-12 Maternal age  76-20 Para  3  Other Problems Back Pain  Gastroesophageal Reflux Disease  Hemorrhoids  High blood pressure  Hypercholesterolemia   Vitals Weight: 253.6 lb Height: 65.5in Body Surface Area: 2.2 m Body Mass Index: 41.56 kg/m  Temp.: 98.99F  Pulse: 73 (Regular)  BP: 138/78(Sitting, Left Arm, Standard)  Physical Exam  See vital signs recorded above  GENERAL APPEARANCE Development: normal Nutritional status: normal Gross deformities: none  SKIN Rash, lesions, ulcers: none Induration, erythema: none Nodules: none palpable  EYES Conjunctiva and lids: normal Pupils: equal and reactive Iris: normal bilaterally  EARS, NOSE, MOUTH, THROAT External ears: no lesion or deformity External nose: no lesion or deformity Hearing: grossly normal  NECK Symmetric: yes Trachea:  midline Thyroid: no palpable nodules in the thyroid bed  CHEST Respiratory effort: normal Retraction or accessory muscle use: no Breath sounds: normal bilaterally Rales, rhonchi, wheeze: none  CARDIOVASCULAR Auscultation: regular rhythm, normal rate Murmurs: none Pulses: carotid and radial  pulse 2+ palpable Lower extremity edema: none Lower extremity varicosities: none  MUSCULOSKELETAL Station and gait: normal Digits and nails: no clubbing or cyanosis Muscle strength: grossly normal all extremities Range of motion: grossly normal all extremities Deformity: none  LYMPHATIC Cervical: none palpable Supraclavicular: none palpable  PSYCHIATRIC Oriented to person, place, and time: yes Mood and affect: normal for situation Judgment and insight: appropriate for situation    Assessment & Plan   PRIMARY HYPERPARATHYROIDISM (E21.0) RIGHT THYROID NODULE (E04.1)  The patient and I reviewed all of her diagnostic studies in detail. She has already reviewed the reports on line. 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 We discussed an operative strategy to perform minimally invasive parathyroidectomy. We would approach the left inferior parathyroid gland. If, at the time of surgery, this does not prove to be a parathyroid adenoma, then we would proceed with a traditional neck exploration and try to identify all 4 parathyroid glands. This would require an overnight hospital stay. Otherwise we will plan to do this as an outpatient surgical procedure.  Patient and I discussed the risk and benefits of this procedure. We discussed the expected outcome. We discussed the location and size of the surgical incision. She wishes to proceed with surgery in the near future. We will make arrangements for surgery at a time convenient for the patient.  The risks and benefits of the procedure have been discussed at length with the patient. The patient understands the proposed  procedure, potential alternative treatments, and the course of recovery to be expected. All of the patient's questions have been answered at this time. The patient wishes to proceed with surgery.  [name], [place] [facility] Office: [phone]

## 2019-04-12 ENCOUNTER — Encounter (HOSPITAL_COMMUNITY): Payer: Self-pay

## 2019-04-12 ENCOUNTER — Ambulatory Visit (HOSPITAL_COMMUNITY): Payer: 59 | Admitting: Physician Assistant

## 2019-04-12 ENCOUNTER — Encounter (HOSPITAL_COMMUNITY)
Admission: RE | Disposition: A | Discharge status: Home / Self Care | Payer: Self-pay | Source: Home / Self Care | Attending: Surgery

## 2019-04-12 ENCOUNTER — Ambulatory Visit (HOSPITAL_COMMUNITY)
Admission: RE | Admit: 2019-04-12 | Discharge: 2019-04-12 | Disposition: A | Discharge status: Home / Self Care | Payer: 59 | Attending: Surgery | Admitting: Surgery

## 2019-04-12 ENCOUNTER — Ambulatory Visit (HOSPITAL_COMMUNITY): Payer: 59 | Admitting: Anesthesiology

## 2019-04-12 ENCOUNTER — Other Ambulatory Visit: Payer: Self-pay

## 2019-04-12 DIAGNOSIS — F419 Anxiety disorder, unspecified: Secondary | ICD-10-CM | POA: Diagnosis not present

## 2019-04-12 DIAGNOSIS — Z885 Allergy status to narcotic agent status: Secondary | ICD-10-CM | POA: Diagnosis not present

## 2019-04-12 DIAGNOSIS — K219 Gastro-esophageal reflux disease without esophagitis: Secondary | ICD-10-CM | POA: Diagnosis not present

## 2019-04-12 DIAGNOSIS — D351 Benign neoplasm of parathyroid gland: Secondary | ICD-10-CM | POA: Diagnosis not present

## 2019-04-12 DIAGNOSIS — Z79899 Other long term (current) drug therapy: Secondary | ICD-10-CM | POA: Diagnosis not present

## 2019-04-12 DIAGNOSIS — E7800 Pure hypercholesterolemia, unspecified: Secondary | ICD-10-CM | POA: Diagnosis not present

## 2019-04-12 DIAGNOSIS — Z9842 Cataract extraction status, left eye: Secondary | ICD-10-CM | POA: Insufficient documentation

## 2019-04-12 DIAGNOSIS — Z8261 Family history of arthritis: Secondary | ICD-10-CM | POA: Insufficient documentation

## 2019-04-12 DIAGNOSIS — Z8249 Family history of ischemic heart disease and other diseases of the circulatory system: Secondary | ICD-10-CM | POA: Insufficient documentation

## 2019-04-12 DIAGNOSIS — E213 Hyperparathyroidism, unspecified: Secondary | ICD-10-CM

## 2019-04-12 DIAGNOSIS — Z7982 Long term (current) use of aspirin: Secondary | ICD-10-CM | POA: Insufficient documentation

## 2019-04-12 DIAGNOSIS — E042 Nontoxic multinodular goiter: Secondary | ICD-10-CM | POA: Diagnosis present

## 2019-04-12 DIAGNOSIS — Z886 Allergy status to analgesic agent status: Secondary | ICD-10-CM | POA: Diagnosis not present

## 2019-04-12 DIAGNOSIS — Z888 Allergy status to other drugs, medicaments and biological substances status: Secondary | ICD-10-CM | POA: Diagnosis not present

## 2019-04-12 DIAGNOSIS — I10 Essential (primary) hypertension: Secondary | ICD-10-CM | POA: Diagnosis not present

## 2019-04-12 DIAGNOSIS — Z87891 Personal history of nicotine dependence: Secondary | ICD-10-CM | POA: Diagnosis not present

## 2019-04-12 DIAGNOSIS — M899 Disorder of bone, unspecified: Secondary | ICD-10-CM | POA: Insufficient documentation

## 2019-04-12 DIAGNOSIS — E785 Hyperlipidemia, unspecified: Secondary | ICD-10-CM | POA: Diagnosis not present

## 2019-04-12 DIAGNOSIS — Z8371 Family history of colonic polyps: Secondary | ICD-10-CM | POA: Insufficient documentation

## 2019-04-12 DIAGNOSIS — Z9841 Cataract extraction status, right eye: Secondary | ICD-10-CM | POA: Diagnosis not present

## 2019-04-12 DIAGNOSIS — E210 Primary hyperparathyroidism: Secondary | ICD-10-CM | POA: Diagnosis not present

## 2019-04-12 DIAGNOSIS — Z961 Presence of intraocular lens: Secondary | ICD-10-CM | POA: Diagnosis not present

## 2019-04-12 HISTORY — PX: PARATHYROIDECTOMY: SHX19

## 2019-04-12 SURGERY — PARATHYROIDECTOMY
Anesthesia: General | Laterality: Left

## 2019-04-12 MED ORDER — FENTANYL CITRATE (PF) 100 MCG/2ML IJ SOLN
INTRAMUSCULAR | Status: AC
  Administered 2019-04-12: 50 ug via INTRAVENOUS
  Filled 2019-04-12: qty 2

## 2019-04-12 MED ORDER — CEFAZOLIN SODIUM-DEXTROSE 2-4 GM/100ML-% IV SOLN
2.0000 g | INTRAVENOUS | Status: AC
  Administered 2019-04-12: 2 g via INTRAVENOUS
  Filled 2019-04-12: qty 100

## 2019-04-12 MED ORDER — DEXAMETHASONE SODIUM PHOSPHATE 10 MG/ML IJ SOLN
INTRAMUSCULAR | Status: DC | PRN
  Administered 2019-04-12: 10 mg via INTRAVENOUS

## 2019-04-12 MED ORDER — BUPIVACAINE HCL 0.25 % IJ SOLN
INTRAMUSCULAR | Status: DC | PRN
  Administered 2019-04-12: 10 mL

## 2019-04-12 MED ORDER — CHLORHEXIDINE GLUCONATE CLOTH 2 % EX PADS: 6.0000 | MEDICATED_PAD | Freq: Once | CUTANEOUS | Status: DC

## 2019-04-12 MED ORDER — ROCURONIUM BROMIDE 100 MG/10ML IV SOLN
INTRAVENOUS | Status: DC | PRN
  Administered 2019-04-12: 50 mg via INTRAVENOUS

## 2019-04-12 MED ORDER — LACTATED RINGERS IV SOLN
INTRAVENOUS | Status: DC
  Administered 2019-04-12 (×2): via INTRAVENOUS

## 2019-04-12 MED ORDER — LIDOCAINE HCL (CARDIAC) PF 100 MG/5ML IV SOSY
PREFILLED_SYRINGE | INTRAVENOUS | Status: DC | PRN
  Administered 2019-04-12: 100 mg via INTRAVENOUS

## 2019-04-12 MED ORDER — SCOPOLAMINE 1 MG/3DAYS TD PT72
MEDICATED_PATCH | TRANSDERMAL | Status: DC | PRN
  Administered 2019-04-12: 1 via TRANSDERMAL

## 2019-04-12 MED ORDER — EPHEDRINE SULFATE 50 MG/ML IJ SOLN
INTRAMUSCULAR | Status: DC | PRN
  Administered 2019-04-12: 5 mg via INTRAVENOUS
  Administered 2019-04-12 (×2): 10 mg via INTRAVENOUS
  Administered 2019-04-12: 5 mg via INTRAVENOUS

## 2019-04-12 MED ORDER — FENTANYL CITRATE (PF) 100 MCG/2ML IJ SOLN
INTRAMUSCULAR | Status: DC | PRN
  Administered 2019-04-12 (×4): 25 ug via INTRAVENOUS
  Administered 2019-04-12: 50 ug via INTRAVENOUS
  Administered 2019-04-12: 100 ug via INTRAVENOUS

## 2019-04-12 MED ORDER — FENTANYL CITRATE (PF) 100 MCG/2ML IJ SOLN
INTRAMUSCULAR | Status: AC
  Administered 2019-04-12: 11:00:00 50 ug via INTRAVENOUS
  Filled 2019-04-12: qty 2

## 2019-04-12 MED ORDER — 0.9 % SODIUM CHLORIDE (POUR BTL) OPTIME
TOPICAL | Status: DC | PRN
  Administered 2019-04-12: 10:00:00 1000 mL

## 2019-04-12 MED ORDER — TRAMADOL HCL 50 MG PO TABS: 50.0000 mg | ORAL_TABLET | Freq: Four times a day (QID) | ORAL | 0 refills | Status: DC | PRN

## 2019-04-12 MED ORDER — BUPIVACAINE HCL (PF) 0.25 % IJ SOLN
INTRAMUSCULAR | Status: AC
  Filled 2019-04-12: qty 30

## 2019-04-12 MED ORDER — MIDAZOLAM HCL 5 MG/5ML IJ SOLN
INTRAMUSCULAR | Status: DC | PRN
  Administered 2019-04-12: 1 mg via INTRAVENOUS

## 2019-04-12 MED ORDER — MIDAZOLAM HCL 2 MG/2ML IJ SOLN
INTRAMUSCULAR | Status: AC
  Filled 2019-04-12: qty 2

## 2019-04-12 MED ORDER — PROPOFOL 10 MG/ML IV BOLUS
INTRAVENOUS | Status: DC | PRN
  Administered 2019-04-12: 40 mg via INTRAVENOUS
  Administered 2019-04-12: 110 mg via INTRAVENOUS

## 2019-04-12 MED ORDER — SUGAMMADEX SODIUM 500 MG/5ML IV SOLN
INTRAVENOUS | Status: DC | PRN
  Administered 2019-04-12: 300 mg via INTRAVENOUS

## 2019-04-12 MED ORDER — FENTANYL CITRATE (PF) 100 MCG/2ML IJ SOLN
25.0000 ug | INTRAMUSCULAR | Status: DC | PRN
  Administered 2019-04-12 (×3): 50 ug via INTRAVENOUS
  Administered 2019-04-12 (×2): 25 ug via INTRAVENOUS

## 2019-04-12 MED ORDER — ONDANSETRON HCL 4 MG/2ML IJ SOLN
INTRAMUSCULAR | Status: DC | PRN
  Administered 2019-04-12: 4 mg via INTRAVENOUS

## 2019-04-12 MED ORDER — MEPERIDINE HCL 50 MG/ML IJ SOLN: 6.2500 mg | INTRAMUSCULAR | Status: DC | PRN

## 2019-04-12 MED ORDER — FENTANYL CITRATE (PF) 250 MCG/5ML IJ SOLN
INTRAMUSCULAR | Status: AC
  Filled 2019-04-12: qty 5

## 2019-04-12 MED ORDER — FENTANYL CITRATE (PF) 100 MCG/2ML IJ SOLN: 25.0000 ug | INTRAMUSCULAR | Status: DC | PRN

## 2019-04-12 MED ORDER — ONDANSETRON HCL 4 MG/2ML IJ SOLN
4.0000 mg | Freq: Once | INTRAMUSCULAR | Status: AC | PRN
  Administered 2019-04-12: 4 mg via INTRAVENOUS

## 2019-04-12 MED ORDER — ONDANSETRON HCL 4 MG/2ML IJ SOLN
INTRAMUSCULAR | Status: AC
  Filled 2019-04-12: qty 2

## 2019-04-12 MED ORDER — PROPOFOL 10 MG/ML IV BOLUS
INTRAVENOUS | Status: AC
  Filled 2019-04-12: qty 20

## 2019-04-12 MED ORDER — SCOPOLAMINE 1 MG/3DAYS TD PT72
MEDICATED_PATCH | TRANSDERMAL | Status: AC
  Filled 2019-04-12: qty 1

## 2019-04-12 MED FILL — traMADol HCL 50 MG TABS: 50 | 2 days supply | Qty: 15 | Fill #0

## 2019-04-12 SURGICAL SUPPLY — 43 items
ADH SKN CLS APL DERMABOND .7 (GAUZE/BANDAGES/DRESSINGS) ×1
APL PRP STRL LF DISP 70% ISPRP (MISCELLANEOUS) ×1
ATTRACTOMAT 16X20 MAGNETIC DRP (DRAPES) ×3 IMPLANT
BLADE SURG 15 STRL LF DISP TIS (BLADE) ×1 IMPLANT
BLADE SURG 15 STRL SS (BLADE) ×3
CHLORAPREP W/TINT 26 (MISCELLANEOUS) ×3 IMPLANT
CLIP VESOCCLUDE MED 6/CT (CLIP) ×6 IMPLANT
CLIP VESOCCLUDE SM WIDE 6/CT (CLIP) ×6 IMPLANT
COVER SURGICAL LIGHT HANDLE (MISCELLANEOUS) ×3 IMPLANT
COVER WAND RF STERILE (DRAPES) ×3 IMPLANT
DERMABOND ADVANCED (GAUZE/BANDAGES/DRESSINGS) ×2
DERMABOND ADVANCED .7 DNX12 (GAUZE/BANDAGES/DRESSINGS) IMPLANT
DRAPE LAPAROTOMY T 98X78 PEDS (DRAPES) ×3 IMPLANT
ELECT PENCIL ROCKER SW 15FT (MISCELLANEOUS) ×3 IMPLANT
ELECT REM PT RETURN 15FT ADLT (MISCELLANEOUS) ×3 IMPLANT
GAUZE 4X4 16PLY RFD (DISPOSABLE) ×3 IMPLANT
GLOVE BIO SURGEON STRL SZ 6.5 (GLOVE) ×1 IMPLANT
GLOVE BIO SURGEONS STRL SZ 6.5 (GLOVE) ×1
GLOVE BIOGEL PI IND STRL 6.5 (GLOVE) IMPLANT
GLOVE BIOGEL PI IND STRL 7.0 (GLOVE) IMPLANT
GLOVE BIOGEL PI INDICATOR 6.5 (GLOVE) ×2
GLOVE BIOGEL PI INDICATOR 7.0 (GLOVE) ×2
GLOVE ECLIPSE 6.5 STRL STRAW (GLOVE) ×2 IMPLANT
GLOVE SURG ORTHO 8.0 STRL STRW (GLOVE) ×3 IMPLANT
GLOVE SURG SS PI 7.0 STRL IVOR (GLOVE) ×4 IMPLANT
GOWN STRL REUS W/ TWL LRG LVL3 (GOWN DISPOSABLE) IMPLANT
GOWN STRL REUS W/TWL LRG LVL3 (GOWN DISPOSABLE) ×3
GOWN STRL REUS W/TWL XL LVL3 (GOWN DISPOSABLE) ×7 IMPLANT
HEMOSTAT SURGICEL 2X4 FIBR (HEMOSTASIS) ×2 IMPLANT
ILLUMINATOR WAVEGUIDE N/F (MISCELLANEOUS) IMPLANT
KIT BASIN OR (CUSTOM PROCEDURE TRAY) ×3 IMPLANT
KIT TURNOVER KIT A (KITS) IMPLANT
NDL HYPO 25X1 1.5 SAFETY (NEEDLE) ×1 IMPLANT
NEEDLE HYPO 25X1 1.5 SAFETY (NEEDLE) ×3 IMPLANT
PACK BASIC VI WITH GOWN DISP (CUSTOM PROCEDURE TRAY) ×3 IMPLANT
SUT MNCRL AB 4-0 PS2 18 (SUTURE) ×3 IMPLANT
SUT VIC AB 3-0 SH 18 (SUTURE) ×3 IMPLANT
SYR BULB IRRIGATION 50ML (SYRINGE) ×3 IMPLANT
SYR CONTROL 10ML LL (SYRINGE) ×3 IMPLANT
TOWEL OR 17X26 10 PK STRL BLUE (TOWEL DISPOSABLE) ×3 IMPLANT
TOWEL OR NON WOVEN STRL DISP B (DISPOSABLE) ×3 IMPLANT
TUBING CONNECTING 10 (TUBING) ×2 IMPLANT
TUBING CONNECTING 10' (TUBING) ×1

## 2019-04-12 NOTE — Transfer of Care (Signed)
Immediate Anesthesia Transfer of Care Note  Patient: [name]  Procedure(s) Performed: LEFT SUPERIOR PARATHYROIDECTOMY (Left )  Patient Location: PACU  Anesthesia Type:General  Level of Consciousness: drowsy, patient cooperative and responds to stimulation  Airway & Oxygen Therapy: Patient Spontanous Breathing and Patient connected to face mask oxygen  Post-op Assessment: Report given to RN and Post -op Vital signs reviewed and stable  Post vital signs: Reviewed and stable  Last Vitals:  Vitals Value Taken Time  BP 161/78 04/12/19 1104  Temp 36.6 C 04/12/19 1104  Pulse 78 04/12/19 1105  Resp 11 04/12/19 1105  SpO2 94 % 04/12/19 1105  Vitals shown include unvalidated device data.  Last Pain:  Vitals:   04/12/19 0758  TempSrc:   PainSc: 2       Patients Stated Pain Goal: 2 ([id number])  Complications: No apparent anesthesia complications

## 2019-04-12 NOTE — Anesthesia Procedure Notes (Signed)
Procedure Name: Intubation Date/Time: 04/12/2019 9:47 AM Performed by: Glory Buff, CRNA Pre-anesthesia Checklist: Patient identified, Emergency Drugs available, Suction available and Patient being monitored Patient Re-evaluated:Patient Re-evaluated prior to induction Oxygen Delivery Method: Circle system utilized Preoxygenation: Pre-oxygenation with 100% oxygen Induction Type: IV induction Ventilation: Mask ventilation without difficulty Laryngoscope Size: Miller and 3 Grade View: Grade I Tube type: Oral Tube size: 7.0 mm Number of attempts: 1 Airway Equipment and Method: Stylet and Oral airway Placement Confirmation: ETT inserted through vocal cords under direct vision,  positive ETCO2 and breath sounds checked- equal and bilateral Secured at: 20 cm Tube secured with: Tape Dental Injury: Teeth and Oropharynx as per pre-operative assessment

## 2019-04-12 NOTE — Op Note (Signed)
OPERATIVE REPORT - PARATHYROIDECTOMY  Preoperative diagnosis: Primary hyperparathyroidism  Postop diagnosis: Same  Procedure: Left superior minimally invasive parathyroidectomy  Surgeon:  [name], MD  Assistant: Carlena Hurl, PA-C  Anesthesia: General endotracheal  Estimated blood loss: Minimal  Preparation: ChloraPrep  Indications: Patient returns for follow-up having undergone extensive evaluation for primary hyperparathyroidism. At my request she underwent an ultrasound of the thyroid. This demonstrated 2 small nodules on the left side towards the inferior pole which were candidates for parathyroid adenoma. However it also demonstrated a dominant nodule in the right thyroid lobe and biopsy was recommended. Patient underwent fine-needle aspiration biopsy which showed atypia. Molecular genetic testing was performed in rendered a benign result. We proceeded then with nuclear medicine parathyroid scan which suggested a right sided parathyroid adenoma. In hindsight, this probably correlated more with the right thyroid nodule. Finally on February 04, 2019 she underwent a 4D CT scan of the neck to evaluate for parathyroid adenoma. Again a left-sided nodule measuring 7 mm was favored to represent a parathyroid adenoma. Patient returns today for parathyroidectomy.  Procedure: The patient was prepared in the pre-operative holding area. The patient was brought to the operating room and placed in a supine position on the operating room table. Following administration of general anesthesia, the patient was positioned and then prepped and draped in the usual strict aseptic fashion. After ascertaining that an adequate level of anesthesia been achieved, a neck incision was made with a #15 blade. Dissection was carried through subcutaneous tissues and platysma. Hemostasis was obtained with the electrocautery. Skin flaps were developed circumferentially and a Weitlander retractor was placed for  exposure.  Strap muscles were incised in the midline. Strap muscles were reflected lateralley exposing the thyroid lobe. With gentle blunt dissection the thyroid lobe was mobilized.  Dissection was carried through adipose tissue and an enlarged parathyroid gland was identified. It was gently mobilized. Vascular structures were divided between small ligaclips. Care was taken to avoid the recurrent laryngeal nerve and the esophagus. The parathyroid gland was completely excised. It was submitted to pathology where frozen section confirmed parathyroid tissue consistent with adenoma.  Further dissection in the left inferior thyroid bed failed to identify an enlarged parathyroid gland.  Neck was irrigated with warm saline and good hemostasis was noted. Fibrillar was placed in the operative field. Strap muscles were approximated in the midline with interrupted 3-0 Vicryl sutures. Platysma was closed with interrupted 3-0 Vicryl sutures. Marcaine was infiltrated circumferentially. Skin was closed with a running 4-0 Monocryl subcuticular suture. Wound was washed and dried and Dermabond was applied. Patient was awakened from anesthesia and brought to the recovery room. The patient tolerated the procedure well.   [name], MD [facility] Office: [phone]

## 2019-04-12 NOTE — Anesthesia Procedure Notes (Signed)
Date/Time: 04/12/2019 10:58 AM Performed by: Glory Buff, CRNA Oxygen Delivery Method: Simple face mask Placement Confirmation: positive ETCO2 and breath sounds checked- equal and bilateral Dental Injury: Teeth and Oropharynx as per pre-operative assessment

## 2019-04-12 NOTE — Anesthesia Preprocedure Evaluation (Addendum)
Anesthesia Evaluation  Patient identified by MRN, date of birth, ID band Patient awake    Reviewed: Allergy & Precautions, NPO status , Patient's Chart, lab work & pertinent test results  History of Anesthesia Complications (+) PONV  Airway Mallampati: I  TM Distance: >3 FB Neck ROM: Full    Dental   Pulmonary former smoker,    Pulmonary exam normal        Cardiovascular hypertension, Pt. on medications Normal cardiovascular exam     Neuro/Psych Anxiety    GI/Hepatic GERD  Medicated and Controlled,  Endo/Other    Renal/GU      Musculoskeletal  (+) Rheumatoid disorders,    Abdominal   Peds  Hematology   Anesthesia Other Findings   Reproductive/Obstetrics                             Anesthesia Physical Anesthesia Plan  ASA: II  Anesthesia Plan: General   Post-op Pain Management:    Induction: Intravenous  PONV Risk Score and Plan: 4 or greater and Midazolam, Scopolamine patch - Pre-op, Dexamethasone and Ondansetron  Airway Management Planned: Oral ETT  Additional Equipment:   Intra-op Plan:   Post-operative Plan: Extubation in OR  Informed Consent: I have reviewed the patients History and Physical, chart, labs and discussed the procedure including the risks, benefits and alternatives for the proposed anesthesia with the patient or authorized representative who has indicated his/her understanding and acceptance.       Plan Discussed with: CRNA and Surgeon  Anesthesia Plan Comments:         Anesthesia Quick Evaluation

## 2019-04-12 NOTE — Interval H&P Note (Signed)
History and Physical Interval Note:  04/12/2019 9:10 AM  [name]  has presented today for surgery, with the diagnosis of PRIMARY  HYPERPARATHYRHOIDISM.  The various methods of treatment have been discussed with the patient and family. After consideration of risks, benefits and other options for treatment, the patient has consented to    Procedure(s): LEFT INFERIOR PARATHYROIDECTOMY (Left) as a surgical intervention.    The patient's history has been reviewed, patient examined, no change in status, stable for surgery.  I have reviewed the patient's chart and labs.  Questions were answered to the patient's satisfaction.    [name], [place] [facility] Office: [place]

## 2019-04-13 ENCOUNTER — Encounter (HOSPITAL_COMMUNITY): Payer: Self-pay | Admitting: Surgery

## 2019-04-13 NOTE — Anesthesia Postprocedure Evaluation (Signed)
Anesthesia Post Note  Patient: [name]  Procedure(s) Performed: LEFT SUPERIOR PARATHYROIDECTOMY (Left )     Patient location during evaluation: PACU Anesthesia Type: General Level of consciousness: awake and alert Pain management: pain level controlled Vital Signs Assessment: post-procedure vital signs reviewed and stable Respiratory status: spontaneous breathing, nonlabored ventilation, respiratory function stable and patient connected to nasal cannula oxygen Cardiovascular status: blood pressure returned to baseline and stable Postop Assessment: no apparent nausea or vomiting Anesthetic complications: no    Last Vitals:  Vitals:   04/12/19 1300 04/12/19 1350  BP: (!) 164/81 (!) 174/85  Pulse: 80 80  Resp: 15 16  Temp: 36.8 C 36.4 C  SpO2: 95% 94%    Last Pain:  Vitals:   04/12/19 1350  TempSrc:   PainSc: 4                  Candelario Steppe DAVID

## 2019-05-03 MED FILL — LOSARTAN POTASSIUM 100 MG T: 100 | 30 days supply | Qty: 30 | Fill #1

## 2019-05-03 MED FILL — sulfaSALAzine 500 MG TABS: 500 | 30 days supply | Qty: 120 | Fill #0

## 2019-05-03 MED FILL — METOPROLOL SUCCINATE ER 50: 50 | 90 days supply | Qty: 180 | Fill #1

## 2019-05-05 MED FILL — tiZANidine HCL 2 MG TABS: 2 | 20 days supply | Qty: 60 | Fill #0

## 2019-05-10 DIAGNOSIS — E210 Primary hyperparathyroidism: Secondary | ICD-10-CM | POA: Diagnosis not present

## 2019-05-13 ENCOUNTER — Encounter: Payer: Self-pay | Admitting: Family Medicine

## 2019-05-13 ENCOUNTER — Other Ambulatory Visit: Payer: Self-pay

## 2019-05-13 ENCOUNTER — Telehealth (INDEPENDENT_AMBULATORY_CARE_PROVIDER_SITE_OTHER): Payer: 59 | Admitting: Family Medicine

## 2019-05-13 VITALS — BP 136/84 | HR 78 | Temp 98.7°F | Wt 256.0 lb

## 2019-05-13 DIAGNOSIS — T148XXA Other injury of unspecified body region, initial encounter: Secondary | ICD-10-CM | POA: Diagnosis not present

## 2019-05-13 MED ORDER — DOXYCYCLINE HYCLATE 100 MG PO TABS: 100.0000 mg | ORAL_TABLET | Freq: Two times a day (BID) | ORAL | 0 refills | Status: DC

## 2019-05-13 MED FILL — DOXYCYCLINE HYCLATE 100 MG: 100 | 5 days supply | Qty: 10 | Fill #0

## 2019-05-13 NOTE — Progress Notes (Signed)
Virtual Visit via Video Note  I connected with [name]  on 05/13/19 at  4:00 PM EDT by a video enabled telemedicine application and verified that I am speaking with the correct person using two identifiers.  Location patient: home Location provider:work or home office Persons participating in the virtual visit: patient, provider  I discussed the limitations of evaluation and management by telemedicine and the availability of in person appointments. The patient expressed understanding and agreed to proceed.   HPI:  Acute visit for a wound on her R ankle: -this started "awhile ago" -reports when her ankles swell an area on her ankle will split open, then it heals -this " split open" yesterday, she had some swelling yesterday evening has a little puffiness and pain around it -she is putting neosporin on it -no fevers, malaise, increased redness from her normal redness in this area, pus or purulent drainage  ROS: See pertinent positives and negatives per HPI.  Past Medical History:  Diagnosis Date  . Allergy   . Cataract    BILATERAL-REMOVED 2 YEARS AGO  . 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 GERD (gastroesophageal reflux disease)   . Helicobacter pylori gastritis 10/05/2018  . Hx of colonic polyp - ssp 11/03/2014  . Hypercalcemia   . Hypertension   . Left maxillary fracture ([place]) 07/17/2017   fell down my stairs 2 year ago , deneis any metal in place nor difficulty with jax extension   . Neuropathy   . 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 Osteopenia 10/17/2011   DEXA 09/2007: -1.4 L fem; 10/2011: -1.2 L fem   . Osteoporosis   . PONV (postoperative nausea and vomiting)   . Pseudogout of foot   . Rheumatoid arthritis(714.0) dx 2010  . Shingles    hx of   . Tibial plateau fracture, right, closed, initial encounter 07/16/2017    Past Surgical History:  Procedure Laterality Date  . BREAST BIOPSY  1972  . Broken wrist   2010  . CATARACT EXTRACTION  03/2012   left  . COLONOSCOPY    . COSMETIC SURGERY    . FRACTURE SURGERY    . INNER EAR SURGERY     busted ear drum  . MAXIMUM ACCESS (MAS)POSTERIOR LUMBAR INTERBODY FUSION (PLIF) 1 LEVEL N/A 10/11/2016   Procedure: Lumbar one-Sacral one Maximum access posterior lumbar interbody fusion;  Surgeon: Erline Levine, MD;  Location: [place];  Service: Neurosurgery;  Laterality: N/A;  . ORIF WRIST FRACTURE Right 07/18/2017   Procedure: OPEN REDUCTION INTERNAL FIXATION (ORIF) WRIST FRACTURE;  Surgeon: Renette Butters, MD;  Location: [place];  Service: Orthopedics;  Laterality: Right;  . PARATHYROIDECTOMY Left 04/12/2019   Procedure: LEFT SUPERIOR PARATHYROIDECTOMY;  Surgeon: [name], MD;  Location: [facility];  Service: General;  Laterality: Left;  . pneumonia  2007    Family History  Problem Relation Age of Onset  . Stroke Mother   . Hypertension Father   . Heart attack Father   . Hypertension Sister        x 3  . Hypertension Brother   . Hyperthyroidism Sister        x2, s/p RAI ablation  . Hypothyroidism Brother   . Colon cancer Neg Hx   . Esophageal cancer Neg Hx   . Rectal cancer Neg Hx   . Stomach cancer Neg Hx     SOCIAL HX: see hpi  Current Outpatient Medications:  .  acetaminophen (TYLENOL) 650 MG CR tablet, Take 650 mg by mouth every 8 (eight) hours as needed for pain., Disp: , Rfl:  .  aspirin 81 MG tablet, Take 81 mg by mouth at bedtime. , Disp: , Rfl:  .  Cholecalciferol (VITAMIN D) 125 MCG (5000 UT) CAPS, Take 5,000 Units by mouth daily., Disp: , Rfl:  .  diclofenac sodium (VOLTAREN) 1 % GEL, Apply 2 g topically 4 (four) times daily as needed (pain)., Disp: , Rfl:  .  Fexofenadine HCl (MUCINEX ALLERGY PO), Take by mouth daily as needed., Disp: , Rfl:  .  furosemide (LASIX) 20 MG tablet, TAKE 2 TABLETS BY MOUTH DAILY (Patient taking differently: Take 40 mg by mouth daily. ), Disp: 180 tablet, Rfl: 0 .  gabapentin (NEURONTIN) 300 MG capsule,  TAKE 1 CAPSULE BY MOUTH 2 TIMES DAILY. (Patient taking differently: Take 300 mg by mouth 2 (two) times daily. ), Disp: 180 capsule, Rfl: 0 .  hydroxychloroquine (PLAQUENIL) 200 MG tablet, Take 200 mg by mouth 2 (two) times daily. , Disp: , Rfl:  .  hydroxypropyl methylcellulose / hypromellose (ISOPTO TEARS / GONIOVISC) 2.5 % ophthalmic solution, Place 1 drop into both eyes daily., Disp: , Rfl:  .  losartan (COZAAR) 100 MG tablet, Take 1 tablet (100 mg total) by mouth daily., Disp: 90 tablet, Rfl: 3 .  metoprolol succinate (TOPROL-XL) 50 MG 24 hr tablet, TAKE 2 TABLETS BY MOUTH DAILY. TAKE WITH OR IMMEDIATELY FOLLOWING A MEAL (Patient taking differently: Take 50 mg by mouth 2 (two) times daily. ), Disp: 180 tablet, Rfl: 3 .  sulfaSALAzine (AZULFIDINE) 500 MG EC tablet, TAKE 2 TABLETS BY MOUTH TWICE DAILY (Patient taking differently: Take 1,000 mg by mouth 2 (two) times daily. ), Disp: 120 tablet, Rfl: 0 .  tiZANidine (ZANAFLEX) 2 MG tablet, Take 2 mg by mouth every 8 (eight) hours as needed for muscle spasms., Disp: , Rfl:  .  traMADol (ULTRAM) 50 MG tablet, Take 1-2 tablets (50-100 mg total) by mouth every 6 (six) hours as needed., Disp: 15 tablet, Rfl: 0 .  doxycycline (VIBRA-TABS) 100 MG tablet, Take 1 tablet (100 mg total) by mouth 2 (two) times daily., Disp: 10 tablet, Rfl: 0  EXAM:  VITALS per patient if applicable:denies fevers  GENERAL: alert, oriented, appears well and in no acute distress  HEENT: atraumatic, conjunttiva clear, no obvious abnormalities on inspection of external nose and ears  NECK: normal movements of the head and neck  LUNGS: on inspection no signs of respiratory distress, breathing rate appears normal, no obvious gross SOB, gasping or wheezing  CV: no obvious cyanosis  SKIN: limited exam due to poor video image, can see her usual swelling in the ankles/feet with small likely ~ 1 cm crack in the skin on post impacted ankle, no appreciable drainage. It is difficult to  asses any additional redness compared to her baseline.  MS: moves all visible extremities without noticeable abnormality  PSYCH/NEURO: pleasant and cooperative, no obvious depression or anxiety, speech and thought processing grossly intact  ASSESSMENT AND PLAN:  Discussed the following assessment and plan:  Open wound of skin  -we discussed possible serious and likely etiologies, options for evaluation and workup, limitations of telemedicine visit vs in person visit, treatment, treatment risks and precautions. Pt prefers to treat via telemedicine empirically rather then risking or undertaking an in person visit at this moment. Has longstanding erythema, swelling and pain in the feet/ankles with dry cracking skin  at times. It is difficult to assess via video, but there is the possibility of infection and she prefers to treat empirically with doxy 100mg  bid fo 5 days. Did advise in person visit and visit with a provider to establish primary care in office doc. She wants to see Dr. Ethlyn Gallery and prefers to follow up with me via telemedicine when she can. Reports she was considering transferring to [facility] but changed her mind. For swelling elevation and her lazix.  Patient agrees to seek prompt in person care if worsening, new symptoms arise, or if is not improving with treatment.   I discussed the assessment and treatment plan with the patient. The patient was provided an opportunity to ask questions and all were answered. The patient agreed with the plan and demonstrated an understanding of the instructions.    Lucretia Kern, DO   Patient Instructions   Elevate legs for 30 minutes twice daily.  Use your lasix as prescribed.  Limit sodium in diet and eat a healthy low sugar and low carb diet.   -I sent the medication(s) we discussed to your pharmacy: Meds ordered this encounter  Medications  . doxycycline (VIBRA-TABS) 100 MG tablet    Sig: Take 1 tablet (100 mg total) by mouth 2 (two) times  daily.    Dispense:  10 tablet    Refill:  0    Please let us know if you have any questions or concerns regarding this prescription.  I hope you are feeling better soon! Seek care promptly if your symptoms worsen, new concerns arise or you are not improving with treatment.  Follow up to meet Dr. Ethlyn Gallery when available. Follow up with her or with me in 1-2 weeks. I sent a message to scheduling. Please let us know if someone has not reached out to you in the next few days.

## 2019-05-13 NOTE — Patient Instructions (Signed)
Elevate legs for 30 minutes twice daily.  Use your lasix as prescribed.  Limit sodium in diet and eat a healthy low sugar and low carb diet.   -I sent the medication(s) we discussed to your pharmacy: Meds ordered this encounter  Medications  . doxycycline (VIBRA-TABS) 100 MG tablet    Sig: Take 1 tablet (100 mg total) by mouth 2 (two) times daily.    Dispense:  10 tablet    Refill:  0    Please let us know if you have any questions or concerns regarding this prescription.  I hope you are feeling better soon! Seek care promptly if your symptoms worsen, new concerns arise or you are not improving with treatment.  Follow up to meet Dr. Ethlyn Gallery when available. Follow up with her or with me in 1-2 weeks. I sent a message to scheduling. Please let us know if someone has not reached out to you in the next few days.

## 2019-05-17 ENCOUNTER — Encounter: Payer: Self-pay | Admitting: Neurology

## 2019-05-19 ENCOUNTER — Telehealth: Payer: Self-pay | Admitting: Neurology

## 2019-05-19 ENCOUNTER — Telehealth (INDEPENDENT_AMBULATORY_CARE_PROVIDER_SITE_OTHER): Payer: 59 | Admitting: Neurology

## 2019-05-19 ENCOUNTER — Encounter: Payer: Self-pay | Admitting: Neurology

## 2019-05-19 ENCOUNTER — Other Ambulatory Visit: Payer: Self-pay

## 2019-05-19 ENCOUNTER — Telehealth: Payer: Self-pay | Admitting: Family Medicine

## 2019-05-19 VITALS — Ht 66.0 in | Wt 256.0 lb

## 2019-05-19 DIAGNOSIS — R202 Paresthesia of skin: Secondary | ICD-10-CM

## 2019-05-19 DIAGNOSIS — I7381 Erythromelalgia: Secondary | ICD-10-CM

## 2019-05-19 DIAGNOSIS — G629 Polyneuropathy, unspecified: Secondary | ICD-10-CM

## 2019-05-19 MED ORDER — DIVALPROEX SODIUM 125 MG PO DR TAB: 125.0000 mg | DELAYED_RELEASE_TABLET | Freq: Two times a day (BID) | ORAL | 5 refills | Status: DC

## 2019-05-19 MED FILL — DIVALPROEX SOD DR 125 MG TA: 125 | 30 days supply | Qty: 60 | Fill #0

## 2019-05-19 NOTE — Telephone Encounter (Signed)
Scheduled 1-2 wk f/u with patient.

## 2019-05-19 NOTE — Telephone Encounter (Signed)
Called and left a message to call back to schedule: EMG BUE with Dr. Posey Pronto.

## 2019-05-19 NOTE — Progress Notes (Signed)
   Virtual Visit via Video Note The purpose of this virtual visit is to provide medical care while limiting exposure to the novel coronavirus.    Consent was obtained for video visit:  Yes.   Answered questions that patient had about telehealth interaction:  Yes.   I discussed the limitations, risks, security and privacy concerns of performing an evaluation and management service by telemedicine. I also discussed with the patient that there may be a patient responsible charge related to this service. The patient expressed understanding and agreed to proceed.  Pt location: Home Physician Location: office Name of referring provider:  Lucretia Kern, DO I connected with [name] at patients initiation/request on 05/19/2019 at 10:30 AM EDT by video enabled telemedicine application and verified that I am speaking with the correct person using two identifiers. Pt MRN:  [id number] Pt DOB:  Dec 01, 1950 Video Participants:  [name]   History of Present Illness: This is a 68 y.o. female returning for follow-up of erythromelalgia and new complaints of bilateral hand paresthesias.  Her feet burning and swelling seems to have progressed over the past few months and she only gets relief with ice.  She continues to take gabapentin 300 mg twice daily which eases some of her pain, she is unable to tolerate a higher dose due to cognitive side effects.  Over the past 6 months, she has also noticed new numbness and tingling over the thumb, index finger, and middle finger.  She denies any hand weakness.   Observations/Objective:   Vitals:   05/17/19 1109  Weight: 256 lb (116.1 kg)  Height: 5\' 6"  (1.676 m)   Patient is awake, alert, and appears comfortable.  Oriented x 4.   Extraocular muscles are intact. No ptosis.  Face is symmetric.  Speech is not dysarthric.  Antigravity in all extremities.     Assessment and Plan:  1. 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 Previously tried:  Lyrica (swelling), nortriptyline (tremors), Cymbalta (swelling)   Continue gabapentin 300mg  twice daily. Start Depakote 125 mg twice daily and titrate as able.  Side effects were discussed. Consider referral to pain management going forward  2. Bilateral hand paresthesias, suggestive of carpal tunnel syndrome. NCS/EMG bilateral arms to determine whether this is progression of neuropathy or entrapment syndrome  Follow Up Instructions:   I discussed the assessment and treatment plan with the patient. The patient was provided an opportunity to ask questions and all were answered. The patient agreed with the plan and demonstrated an understanding of the instructions.   The patient was advised to call back or seek an in-person evaluation if the symptoms worsen or if the condition fails to improve as anticipated.  Follow-up after testing  Total time spent:  25 minutes     Alda Berthold, DO

## 2019-05-20 NOTE — Telephone Encounter (Signed)
1. Patient scheduled for EMG on 07/01/19 at 2:15 PM. She declined to be placed on the wait list.  2. Patient stated the new medication she took last night for the fist time, divalproex 125 MG, caused some possible side-effects and she does not want to continue taking it.  Patient stated she woke up this morning with worsening swelling in her lips, legs, and ankles. She said felt really bad and her legs ached more than normal as well.  Patient stated when questioned that she is not having any trouble breathing.

## 2019-05-20 NOTE — Telephone Encounter (Signed)
Pt advised.

## 2019-05-20 NOTE — Telephone Encounter (Signed)
Please advise of further recommendations

## 2019-05-20 NOTE — Telephone Encounter (Signed)
Please instruct her to stop the medication and take benadryl 25mg  for swelling.  I will marked this medication as an allergy. Monitor for any worsening symptoms, if it persists, she may need to be seen in urgent care.

## 2019-05-21 ENCOUNTER — Encounter: Payer: Self-pay | Admitting: Family Medicine

## 2019-05-21 ENCOUNTER — Telehealth (INDEPENDENT_AMBULATORY_CARE_PROVIDER_SITE_OTHER): Payer: 59 | Admitting: Family Medicine

## 2019-05-21 ENCOUNTER — Ambulatory Visit: Payer: 59 | Admitting: Neurology

## 2019-05-21 ENCOUNTER — Telehealth: Payer: Self-pay | Admitting: *Deleted

## 2019-05-21 ENCOUNTER — Other Ambulatory Visit: Payer: Self-pay

## 2019-05-21 DIAGNOSIS — R600 Localized edema: Secondary | ICD-10-CM

## 2019-05-21 DIAGNOSIS — L03115 Cellulitis of right lower limb: Secondary | ICD-10-CM | POA: Diagnosis not present

## 2019-05-21 MED ORDER — FUROSEMIDE 20 MG PO TABS: 40.0000 mg | ORAL_TABLET | Freq: Two times a day (BID) | ORAL | 1 refills | Status: DC | PRN

## 2019-05-21 MED ORDER — MUPIROCIN 2 % EX OINT: 1.0000 "application " | TOPICAL_OINTMENT | Freq: Two times a day (BID) | CUTANEOUS | 0 refills | Status: DC

## 2019-05-21 MED ORDER — CEPHALEXIN 500 MG PO CAPS: 500.0000 mg | ORAL_CAPSULE | Freq: Four times a day (QID) | ORAL | 0 refills | Status: AC

## 2019-05-21 MED FILL — MUPIROCIN 2% OINTMENT: 2 | 10 days supply | Qty: 22 | Fill #0

## 2019-05-21 MED FILL — CEPHALEXIN 500 MG CAPSULE: 500 | 7 days supply | Qty: 28 | Fill #0

## 2019-05-21 NOTE — Telephone Encounter (Signed)
-----   Message from Caren Macadam, MD sent at 05/21/2019  9:05 AM EDT ----- Please schedule 1 week follow up with either HK or me

## 2019-05-21 NOTE — Telephone Encounter (Signed)
Appt scheduled for 10/9.

## 2019-05-21 NOTE — Progress Notes (Signed)
Virtual Visit via Video Note  I connected with [name]  on 05/22/19 at  8:30 AM EDT by a video enabled telemedicine application and verified that I am speaking with the correct person using two identifiers.  Location patient: home Location provider:work or home office Persons participating in the virtual visit: patient, provider  I discussed the limitations of evaluation and management by telemedicine and the availability of in person appointments. The patient expressed understanding and agreed to proceed.   [name] DOB: [birth year] Encounter date: 05/21/2019  This is a 68 y.o. female who presents with Chief Complaint  Patient presents with  . Follow-up    patient complains of recurrent pain and swelling bilat lower extremities, completed Doxycycline 4 days ago    History of present illness: Virtual visit with [facility] on 9/24 for right ankle wound. Difficulty seeing wound with virtual visit, but epiric treatment with doxy 100mg  BID x 5 days advised.   States that feet and ankles are still swollen. Spot on ankle that she was treated for seemed to start to improve for a couple of days, but felt it worsened after coming off of the antibiotic. Very sore around this area. Area is somewhat white around it. But she is using neosporin cream on this.   She does feel like swelling she has now is a little worse than normal. Trying to keep skin very moist. In morning ankles are very swollen when she gets out of bed. Has felt them tear open in the morning.   Sometimes feels like she she gets fluid out with the lasix. Has doen a repeat in the afternoon but usually just takes a single 20mg . Sometimes she feels that it is not effective. Doesn't always urinate more with the 40mg . Has been this way for awhile.   No fevers. States she will get chilled when her feet get really hot. This has been going on for months - feet get hot, then rest of body feels cold. Once she gets feet cooled down (puts  them in ice) then body warms back up. Initially thought related to something with thyroid because of other sx (hair loss, weight gain).   Had recent parathyroidectomy. Counts were looking normal when she had bloodwork recently.  Cannot tolerate compression stockings. Feels like she is going to explode. Just cannot tolerate the pressure. Does have compression pumps, but hasn't used those recently. Maybe they help with use.     Allergies  Allergen Reactions  . Amlodipine Besylate Other (See Comments)    Tremors  . Depakote [Divalproex Sodium] Swelling  . Nortriptyline Other (See Comments)    tremors  . Dilaudid [Hydromorphone Hcl]     Headache, muscle tightness   Current Meds  Medication Sig  . acetaminophen (TYLENOL) 650 MG CR tablet Take 650 mg by mouth every 8 (eight) hours as needed for pain.  Marland Kitchen aspirin 81 MG tablet Take 81 mg by mouth at bedtime.   . Cholecalciferol (VITAMIN D) 125 MCG (5000 UT) CAPS Take 5,000 Units by mouth daily.  . diclofenac sodium (VOLTAREN) 1 % GEL Apply 2 g topically 4 (four) times daily as needed (pain).  Marland Kitchen diphenhydrAMINE HCl (BENADRYL PO) Take by mouth as needed.   Marland Kitchen Fexofenadine HCl (MUCINEX ALLERGY PO) Take by mouth daily as needed.  . furosemide (LASIX) 20 MG tablet Take 2-3 tablets (40-60 mg total) by mouth 2 (two) times daily as needed for edema.  . gabapentin (NEURONTIN) 300 MG capsule TAKE 1 CAPSULE  BY MOUTH 2 TIMES DAILY. (Patient taking differently: Take 300 mg by mouth 2 (two) times daily. )  . hydroxychloroquine (PLAQUENIL) 200 MG tablet Take 200 mg by mouth 2 (two) times daily.   . hydroxypropyl methylcellulose / hypromellose (ISOPTO TEARS / GONIOVISC) 2.5 % ophthalmic solution Place 1 drop into both eyes daily.  Marland Kitchen losartan (COZAAR) 100 MG tablet Take 1 tablet (100 mg total) by mouth daily.  . metoprolol succinate (TOPROL-XL) 50 MG 24 hr tablet TAKE 2 TABLETS BY MOUTH DAILY. TAKE WITH OR IMMEDIATELY FOLLOWING A MEAL (Patient taking  differently: Take 50 mg by mouth 2 (two) times daily. )  . sulfaSALAzine (AZULFIDINE) 500 MG EC tablet TAKE 2 TABLETS BY MOUTH TWICE DAILY (Patient taking differently: Take 1,000 mg by mouth 2 (two) times daily. )  . tiZANidine (ZANAFLEX) 2 MG tablet Take 2 mg by mouth every 8 (eight) hours as needed for muscle spasms.  . traMADol (ULTRAM) 50 MG tablet Take 1-2 tablets (50-100 mg total) by mouth every 6 (six) hours as needed.  . [DISCONTINUED] divalproex (DEPAKOTE) 125 MG DR tablet Take 125 mg by mouth.  . [DISCONTINUED] doxycycline (VIBRA-TABS) 100 MG tablet Take 1 tablet (100 mg total) by mouth 2 (two) times daily.  . [DISCONTINUED] furosemide (LASIX) 20 MG tablet TAKE 2 TABLETS BY MOUTH DAILY (Patient taking differently: Take 40 mg by mouth daily. )    Review of Systems  Constitutional: Negative for chills, fatigue and fever.  Respiratory: Negative for cough, chest tightness, shortness of breath and wheezing.   Cardiovascular: Positive for leg swelling. Negative for chest pain and palpitations.  Skin: Positive for color change, rash and wound.    Objective:  There were no vitals taken for this visit.      BP Readings from Last 3 Encounters:  05/13/19 136/84  04/12/19 (!) 174/85  04/07/19 (!) 181/77   Wt Readings from Last 3 Encounters:  05/17/19 256 lb (116.1 kg)  05/13/19 256 lb (116.1 kg)  04/12/19 248 lb (112.5 kg)    EXAM:  GENERAL: alert, oriented, appears well and in no acute distress  HEENT: atraumatic, conjunctiva clear, no obvious abnormalities on inspection of external nose and ears  NECK: normal movements of the head and neck  LUNGS: on inspection no signs of respiratory distress, breathing rate appears normal, no obvious gross SOB, gasping or wheezing  CV: no obvious cyanosis  MS: moves all visible extremities without noticeable abnormality  PSYCH/NEURO: pleasant and cooperative, no obvious depression or anxiety, speech and thought processing grossly  intact  SKIN: It is very difficult for her to get the camera positioned with adequate light to show current wound.  However, it is visible but there is a approximate 2 cm open linear wound at her ankle crease.  There is not significant erythema around this specific area, but the entire extremity is erythematous with thickened appearing tissue that appears to have some developing blisters.  There is significant edema.  Assessment/Plan  1. Cellulitis of right lower extremity Going to start her on Keflex.  We discussed importance of keeping leg elevated above heart level to help with edema.  We also discussed that it seems her current Lasix dose is not enough to adequately diurese her reliably.  We discussed that when she takes her medication she should note a significant increase in urination, which she is not always doing.  We are going to increase her Lasix dose to 60 mg and given this twice daily through the weekend.  After that she can resume once daily dosing of the Lasix but keep dose at 60 mg.  We will schedule follow-up visit next week to see how she is doing, but I encouraged her to let us know if she feels that anything is worsening in the meanwhile. - mupirocin ointment (BACTROBAN) 2 %; Apply 1 application topically 2 (two) times daily.  Dispense: 22 g; Refill: 0  2. Lower extremity edema -chronic This is an ongoing issue for her.  She does not tolerate compression as it causes too much pain.  She has had some massage before which is been helpful (although uncomfortable). It seems she has seen multiple specialists for her leg pain in the past including neurology, vascular, podiatry, dermatology, and rheumatology.  I am going to touch base with her active PCP and see what other modalities might be reasonable to consider for her treatment. - furosemide (LASIX) 20 MG tablet; Take 2-3 tablets (40-60 mg total) by mouth 2 (two) times daily as needed for edema.  Dispense: 200 tablet; Refill:  1    I discussed the assessment and treatment plan with the patient. The patient was provided an opportunity to ask questions and all were answered. The patient agreed with the plan and demonstrated an understanding of the instructions.   The patient was advised to call back or seek an in-person evaluation if the symptoms worsen or if the condition fails to improve as anticipated.  I provided 32 minutes of non-face-to-face time during this encounter.   [name], MD

## 2019-05-22 ENCOUNTER — Other Ambulatory Visit: Payer: Self-pay

## 2019-05-22 ENCOUNTER — Ambulatory Visit (INDEPENDENT_AMBULATORY_CARE_PROVIDER_SITE_OTHER): Payer: 59

## 2019-05-22 DIAGNOSIS — Z23 Encounter for immunization: Secondary | ICD-10-CM | POA: Diagnosis not present

## 2019-05-24 DIAGNOSIS — M5136 Other intervertebral disc degeneration, lumbar region: Secondary | ICD-10-CM | POA: Diagnosis not present

## 2019-05-24 DIAGNOSIS — M0609 Rheumatoid arthritis without rheumatoid factor, multiple sites: Secondary | ICD-10-CM | POA: Diagnosis not present

## 2019-05-24 DIAGNOSIS — R5382 Chronic fatigue, unspecified: Secondary | ICD-10-CM | POA: Diagnosis not present

## 2019-05-24 DIAGNOSIS — M2550 Pain in unspecified joint: Secondary | ICD-10-CM | POA: Diagnosis not present

## 2019-05-24 DIAGNOSIS — Z6841 Body Mass Index (BMI) 40.0 and over, adult: Secondary | ICD-10-CM | POA: Diagnosis not present

## 2019-05-24 DIAGNOSIS — M5441 Lumbago with sciatica, right side: Secondary | ICD-10-CM | POA: Diagnosis not present

## 2019-05-24 DIAGNOSIS — I7381 Erythromelalgia: Secondary | ICD-10-CM | POA: Diagnosis not present

## 2019-05-24 MED FILL — FUROSEMIDE 20 MG TABS: 20 | 30 days supply | Qty: 180 | Fill #0

## 2019-05-25 NOTE — Progress Notes (Signed)
This has been so tough for here. I believe I did refer her to lymphedema clinic in the past (or recommended it) - I think if I recall there is one in [place]. Hilda Blades may know as I don't recall the name!

## 2019-05-27 NOTE — Progress Notes (Signed)
Is there a lymphedema clinic in [place]? Dr. Maudie Mercury wasn't sure. Or somewhere else? I think this would really benefit [name].

## 2019-05-28 ENCOUNTER — Telehealth (INDEPENDENT_AMBULATORY_CARE_PROVIDER_SITE_OTHER): Payer: 59 | Admitting: Family Medicine

## 2019-05-28 ENCOUNTER — Other Ambulatory Visit: Payer: Self-pay

## 2019-05-28 ENCOUNTER — Telehealth: Payer: Self-pay | Admitting: Neurology

## 2019-05-28 ENCOUNTER — Other Ambulatory Visit: Payer: Self-pay | Admitting: Neurology

## 2019-05-28 ENCOUNTER — Encounter: Payer: Self-pay | Admitting: Family Medicine

## 2019-05-28 DIAGNOSIS — I890 Lymphedema, not elsewhere classified: Secondary | ICD-10-CM

## 2019-05-28 DIAGNOSIS — L03115 Cellulitis of right lower limb: Secondary | ICD-10-CM | POA: Diagnosis not present

## 2019-05-28 MED FILL — GABAPENTIN 300 MG CAPSULE: 300 | 90 days supply | Qty: 180 | Fill #0

## 2019-05-28 NOTE — Telephone Encounter (Signed)
Pt needs a refill on the gabapentin sent to the [facility] out patient pharmacy she will not have enough to get her through the weekend

## 2019-05-28 NOTE — Progress Notes (Signed)
Virtual Visit via Video Note  I connected with [name]  on 05/29/19 at  9:30 AM EDT by a video enabled telemedicine application and verified that I am speaking with the correct person using two identifiers.  Location patient: home Location provider:work or home office Persons participating in the virtual visit: patient, provider  I discussed the limitations of evaluation and management by telemedicine and the availability of in person appointments. The patient expressed understanding and agreed to proceed.   [name] DOB: [birth year] Encounter date: 05/28/2019  This is a 68 y.o. female who presents with Chief Complaint  Patient presents with  . Follow-up    History of present illness: Virtual visit for follow up of cellulitis and chronic edema.   Saw [facility] in [place] - for lymphedema clinic(in distant past) - they didn't do anything for her. She thought they would do massage. She was given paper handout; given instructions for her to follow to do things on her own.  Pain in last couple of days has been very bad. Had some chicken wings from [employer] (just a few) but thinks this contributed to her pain. Feels like nitrates set it off. Any canned goods. Pain is really almost unbearable. Swelling starts and then pain really amplifies. Not sure what she can eat at this point. Has had swelling "as long as I can remember". Even back in her 20's she had swelling.   Does feel like she urinated more with increase in fluid pill, but not as much as she expected. Did not that the swelling went down a little, but came back when switching to daily.  Of note, she was taking 40mg  daily after doing the 60mg  twice daily.  Still has swelling in legs. Area on leg that had cut hasn't completely healed. Area around it is still sore, but coloring looks better. Area around it is very tender.   Allergies  Allergen Reactions  . Amlodipine Besylate Other (See Comments)    Tremors   . Depakote [Divalproex Sodium] Swelling  . Nortriptyline Other (See Comments)    tremors  . Dilaudid [Hydromorphone Hcl]     Headache, muscle tightness   Current Meds  Medication Sig  . acetaminophen (TYLENOL) 650 MG CR tablet Take 650 mg by mouth every 8 (eight) hours as needed for pain.  Marland Kitchen aspirin 81 MG tablet Take 81 mg by mouth at bedtime.   . [EXPIRED] cephALEXin (KEFLEX) 500 MG capsule Take 1 capsule (500 mg total) by mouth 4 (four) times daily for 7 days.  . Cholecalciferol (VITAMIN D) 125 MCG (5000 UT) CAPS Take 5,000 Units by mouth daily.  . diclofenac sodium (VOLTAREN) 1 % GEL Apply 2 g topically 4 (four) times daily as needed (pain).  Marland Kitchen diphenhydrAMINE HCl (BENADRYL PO) Take by mouth as needed.   Marland Kitchen Fexofenadine HCl (MUCINEX ALLERGY PO) Take by mouth daily as needed.  . furosemide (LASIX) 20 MG tablet Take 2-3 tablets (40-60 mg total) by mouth 2 (two) times daily as needed for edema.  . hydroxychloroquine (PLAQUENIL) 200 MG tablet Take 200 mg by mouth 2 (two) times daily.   . hydroxypropyl methylcellulose / hypromellose (ISOPTO TEARS / GONIOVISC) 2.5 % ophthalmic solution Place 1 drop into both eyes daily.  Marland Kitchen losartan (COZAAR) 100 MG tablet Take 1 tablet (100 mg total) by mouth daily.  . metoprolol succinate (TOPROL-XL) 50 MG 24 hr tablet TAKE 2 TABLETS BY MOUTH DAILY. TAKE WITH OR IMMEDIATELY FOLLOWING A MEAL (Patient taking differently:  Take 50 mg by mouth 2 (two) times daily. )  . mupirocin ointment (BACTROBAN) 2 % Apply 1 application topically 2 (two) times daily.  Marland Kitchen sulfaSALAzine (AZULFIDINE) 500 MG EC tablet TAKE 2 TABLETS BY MOUTH TWICE DAILY (Patient taking differently: Take 1,000 mg by mouth 2 (two) times daily. )  . tiZANidine (ZANAFLEX) 2 MG tablet Take 2 mg by mouth every 8 (eight) hours as needed for muscle spasms.  . [DISCONTINUED] gabapentin (NEURONTIN) 300 MG capsule TAKE 1 CAPSULE BY MOUTH 2 TIMES DAILY. (Patient taking differently: Take 300 mg by mouth 2 (two)  times daily. )    Review of Systems  Constitutional: Negative for chills, fatigue and fever.  Respiratory: Negative for cough, chest tightness, shortness of breath and wheezing.   Cardiovascular: Positive for leg swelling. Negative for chest pain and palpitations.  Skin: Positive for color change and wound.  Neurological: Positive for numbness (neuropathy/pain in legs).    Objective:  There were no vitals taken for this visit.      BP Readings from Last 3 Encounters:  05/13/19 136/84  04/12/19 (!) 174/85  04/07/19 (!) 181/77   Wt Readings from Last 3 Encounters:  05/17/19 256 lb (116.1 kg)  05/13/19 256 lb (116.1 kg)  04/12/19 248 lb (112.5 kg)    EXAM:  GENERAL: alert, oriented, appears well and in no acute distress  HEENT: atraumatic, conjunctiva clear, no obvious abnormalities on inspection of external nose and ears  NECK: normal movements of the head and neck  LUNGS: on inspection no signs of respiratory distress, breathing rate appears normal, no obvious gross SOB, gasping or wheezing  CV: no obvious cyanosis  MS: moves all visible extremities without noticeable abnormality  PSYCH/NEURO: pleasant and cooperative, no obvious depression or anxiety, speech and thought processing grossly intact  SKIN: leg color has significantly improved (still using video medicine so there are limitations); overall significant decrease in erythema, blistering that was starting at last visit has resolved. Legs are still edematous. There is no active drainage from wound. Healing well. "crack in skin" appears smaller with no surrounding erythema.    Assessment/Plan  1. Cellulitis of right lower extremity This looks significantly better overall. Patient is still having pain, but this is chronic for her and seems to be very fluid sensitive. I do not feel (based on video eval) that she needs additional treatment at this time. Can continue with topical antibiotic ointment, leg  moisturizing.  2. Lymphedema Will see about local lymphedema clinics for her. She is interested in some intervention to help with edema. We are also going to keep lasix at 60mg  daily since she seems to have better diuresis at this dose. Elevation. She cannot tolerate compression. Follow up pending appointment availability with lymphedema clinic.   (we are referring to the [facility] lymphedema clinic for further evaluation and treatment)   Return if symptoms worsen or fail to improve.   I discussed the assessment and treatment plan with the patient. The patient was provided an opportunity to ask questions and all were answered. The patient agreed with the plan and demonstrated an understanding of the instructions.   The patient was advised to call back or seek an in-person evaluation if the symptoms worsen or if the condition fails to improve as anticipated.  I provided 25 minutes of non-face-to-face time during this encounter.   [name], MD

## 2019-06-02 MED FILL — LOSARTAN POTASSIUM 100 MG T: 100 | 30 days supply | Qty: 30 | Fill #2

## 2019-06-02 MED FILL — sulfaSALAzine 500 MG TABS: 500 | 30 days supply | Qty: 120 | Fill #1

## 2019-06-17 ENCOUNTER — Ambulatory Visit (HOSPITAL_COMMUNITY): Payer: Medicare Other | Admitting: Physical Therapy

## 2019-07-01 ENCOUNTER — Encounter: Payer: 59 | Admitting: Neurology

## 2019-07-05 MED FILL — HYDROXYCHLOROQUINE 200 MG T: 200 | 90 days supply | Qty: 180 | Fill #1

## 2019-07-05 MED FILL — sulfaSALAzine 500 MG TABS: 500 | 30 days supply | Qty: 120 | Fill #2

## 2019-07-06 ENCOUNTER — Ambulatory Visit (HOSPITAL_COMMUNITY): Payer: Medicare Other | Admitting: Physical Therapy

## 2019-07-08 ENCOUNTER — Ambulatory Visit (HOSPITAL_COMMUNITY): Payer: Medicare Other | Admitting: Physical Therapy

## 2019-07-08 MED FILL — LOSARTAN POTASSIUM 100 MG T: 100 | 30 days supply | Qty: 30 | Fill #3

## 2019-07-12 ENCOUNTER — Telehealth: Payer: Self-pay | Admitting: *Deleted

## 2019-07-12 ENCOUNTER — Encounter (HOSPITAL_COMMUNITY): Payer: Self-pay | Admitting: Physical Therapy

## 2019-07-12 ENCOUNTER — Telehealth: Payer: Self-pay | Admitting: Family Medicine

## 2019-07-12 ENCOUNTER — Ambulatory Visit (HOSPITAL_COMMUNITY): Payer: 59 | Attending: Family Medicine | Admitting: Physical Therapy

## 2019-07-12 ENCOUNTER — Other Ambulatory Visit: Payer: Self-pay

## 2019-07-12 ENCOUNTER — Other Ambulatory Visit: Payer: Self-pay | Admitting: Family Medicine

## 2019-07-12 DIAGNOSIS — T148XXA Other injury of unspecified body region, initial encounter: Secondary | ICD-10-CM

## 2019-07-12 DIAGNOSIS — S91202A Unspecified open wound of left great toe with damage to nail, initial encounter: Secondary | ICD-10-CM | POA: Insufficient documentation

## 2019-07-12 DIAGNOSIS — S91201A Unspecified open wound of right great toe with damage to nail, initial encounter: Secondary | ICD-10-CM | POA: Diagnosis not present

## 2019-07-12 DIAGNOSIS — R601 Generalized edema: Secondary | ICD-10-CM | POA: Insufficient documentation

## 2019-07-12 DIAGNOSIS — G629 Polyneuropathy, unspecified: Secondary | ICD-10-CM

## 2019-07-12 DIAGNOSIS — M79661 Pain in right lower leg: Secondary | ICD-10-CM | POA: Diagnosis not present

## 2019-07-12 DIAGNOSIS — M79662 Pain in left lower leg: Secondary | ICD-10-CM | POA: Insufficient documentation

## 2019-07-12 DIAGNOSIS — R600 Localized edema: Secondary | ICD-10-CM

## 2019-07-12 MED ORDER — DOXYCYCLINE HYCLATE 100 MG PO TABS: 100.0000 mg | ORAL_TABLET | Freq: Two times a day (BID) | ORAL | 0 refills | Status: DC

## 2019-07-12 MED FILL — DOXYCYCLINE HYCLATE 100 MG: 100 | 7 days supply | Qty: 14 | Fill #0

## 2019-07-12 NOTE — Telephone Encounter (Signed)
Pt called in because she was told by her therapist that she would send PCP a message about pt needing a Rx sent to the pharmacy  for openings in her feet. Pt called in to be advised and to follow up   CB [phone]. [phone]

## 2019-07-12 NOTE — Telephone Encounter (Signed)
Can you get more information please? Sounds like she may need visit if we are concerned with infection? I do know she can do virtual visits; may need to set this up (would be ok with HK as well)

## 2019-07-12 NOTE — Telephone Encounter (Signed)
Noted. We had discussed previously though concerns for circulation with continued icing. Will check in after results from orders are back.

## 2019-07-12 NOTE — Telephone Encounter (Signed)
See prior phone note. 

## 2019-07-12 NOTE — Therapy (Signed)
[place], [place], [address] Phone: [phone]   Fax:  [phone]  Physical Therapy Evaluation  Patient Details  Name: [name] MRN: [id number] Date of Birth: [birth year] Referring Provider (PT): [name]    Encounter Date: 07/12/2019  PT End of Session - 07/12/19 1032    Visit Number  1    Number of Visits  12    Date for PT Re-Evaluation  08/23/19   Pt will not begin treatment right away due to needing an ABI   Authorization Type  Medicare    PT Start Time  0915    PT Stop Time  1030    PT Time Calculation (min)  75 min       Past Medical History:  Diagnosis Date  . Allergy   . Cataract    BILATERAL-REMOVED 2 YEARS AGO  . Erythromelalgia [facility])    followed by neuro Dr Posey Pronto , mgd on gabapentin , dx several years ago   . GERD (gastroesophageal reflux disease)   . Helicobacter pylori gastritis 10/05/2018  . Hx of colonic polyp - ssp 11/03/2014  . Hypercalcemia   . Hypertension   . Left maxillary fracture ([place]) 07/17/2017   fell down my stairs 2 year ago , deneis any metal in place nor difficulty with jax extension   . Neuropathy   . 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 Osteopenia 10/17/2011   DEXA 09/2007: -1.4 L fem; 10/2011: -1.2 L fem   . Osteoporosis   . PONV (postoperative nausea and vomiting)   . Pseudogout of foot   . Rheumatoid arthritis(714.0) dx 2010  . Shingles    hx of   . Tibial plateau fracture, right, closed, initial encounter 07/16/2017    Past Surgical History:  Procedure Laterality Date  . BREAST BIOPSY  1972  . Broken wrist  2010  . CATARACT EXTRACTION  03/2012   left  . COLONOSCOPY    . COSMETIC SURGERY    . FRACTURE SURGERY    . INNER EAR SURGERY     busted ear drum  . MAXIMUM ACCESS (MAS)POSTERIOR LUMBAR INTERBODY FUSION (PLIF) 1 LEVEL N/A 10/11/2016   Procedure: Lumbar one-Sacral one Maximum access posterior lumbar interbody fusion;  Surgeon: Erline Levine, MD;  Location: [place];  Service: Neurosurgery;  Laterality: N/A;  . ORIF WRIST FRACTURE Right 07/18/2017   Procedure: OPEN REDUCTION INTERNAL FIXATION (ORIF) WRIST FRACTURE;  Surgeon: Renette Butters, MD;  Location: [place];  Service: Orthopedics;  Laterality: Right;  . PARATHYROIDECTOMY Left 04/12/2019   Procedure: LEFT SUPERIOR PARATHYROIDECTOMY;  Surgeon: [name], MD;  Location: [facility];  Service: General;  Laterality: Left;  . pneumonia  2007    There were no vitals filed for this visit.   Subjective Assessment - 07/12/19 0923    Subjective  [name] states that she has had increased volume in her LE since she was in her 40's. She states that she had shingles a few years ago and from them on her swelling has been increased.  She states that her whole body swells up.  She was diagnosed with Lymphedema within this year.  She has had wounds in the past.    Pertinent History  Shingles, s/p fx wrist, hx of multiple falls, neuropathy    Limitations  Lifting;House hold activities;Walking    How long can you sit comfortably?  no problem    How long can you stand comfortably?  able  to stand for about 20 mintues    How long can you walk comfortably?  Able to walk for 20 minutes    Patient Stated Goals  move easier decreaed volume.    Currently in Pain?  Yes    Pain Score  4     Pain Location  Foot    Pain Orientation  Right;Left    Pain Descriptors / Indicators  Pressure;Tightness    Pain Type  Chronic pain    Pain Onset  More than a month ago    Pain Frequency  Constant    Aggravating Factors   being up prolong time    Pain Relieving Factors  elevation    Effect of Pain on Daily Activities  PT goes nowhjere now.         [facility] PT Assessment - 07/12/19 0001      Assessment   Medical Diagnosis  Bilateral  LE  lymphedema     Referring Provider (PT)  Andris Flurry Koberlein     Onset Date/Surgical Date  --   chronic    Next MD Visit  not scheduled     Prior Therapy  none       Precautions   Precautions  None      Restrictions   Weight Bearing Restrictions  Yes      Balance Screen   Has the patient fallen in the past 6 months  No    Has the patient had a decrease in activity level because of a fear of falling?   Yes    Is the patient reluctant to leave their home because of a fear of falling?   Yes      Observation/Other Assessments   Focus on Therapeutic Outcomes (FOTO)   life impact 65        LYMPHEDEMA/ONCOLOGY QUESTIONNAIRE - 07/12/19 0941      What other symptoms do you have   Are you Having Heaviness or Tightness  No    Are you having Pain  Yes    Are you having pitting edema  Yes    Is it Hard or Difficult finding clothes that fit  Yes    Do you have infections  No      Lymphedema Stage   Stage  STAGE 2 SPONTANEOUSLY IRREVERSIBLE      Lymphedema Assessments   Lymphedema Assessments  Lower extremities      Right Lower Extremity Lymphedema   20 cm Proximal to Suprapatella  65 cm    10 cm Proximal to Suprapatella  59 cm    At Midpatella/Popliteal Crease  47 cm    30 cm Proximal to Floor at Lateral Plantar Foot  47.5 cm    20 cm Proximal to Floor at Lateral Plantar Foot  37.5 1    10  cm Proximal to Floor at Lateral Malleoli  28 cm    Circumference of ankle/heel  36 cm.    5 cm Proximal to 1st MTP Joint  26.5 cm    Across MTP Joint  26.5 cm      Left Lower Extremity Lymphedema   20 cm Proximal to Suprapatella  68 cm    10 cm Proximal to Suprapatella  60 cm    At Midpatella/Popliteal Crease  48 cm    30 cm Proximal to Floor at Lateral Plantar Foot  49 cm    20 cm Proximal to Floor at Lateral Plantar Foot  36 cm    10 cm Proximal to Floor  at Lateral Malleoli  27.7 cm    Circumference of ankle/heel  36.5 cm.    5 cm Proximal to 1st MTP Joint  37.5 cm    Across MTP Joint  26 cm             Objective measurements completed on examination: See above findings.    LE exercises to include:  10 reps each; ankle pumps, LAQ, hip  ab/adduction, marching, diaphragmic breathing.           PT Education - 07/12/19 1031    Person(s) Educated  Patient :  Educated in what lymphedema is and how it is treated, What an ABI is and why therapist feels one is needed, the need for wound care to pt's toes, What cellulitis is and the need to obtain antibiotics when one has celllulitis.    Methods  Explanation;Handout    Comprehension  Verbalized understanding;Returned demonstration       PT Short Term Goals - 07/12/19 1056      PT SHORT TERM GOAL #1   Title  PT to have an ABI to assure no arterial compromise.    Time  2    Period  Weeks    Status  New    Target Date  07/26/19      PT SHORT TERM GOAL #2   Title  PT to be I in LE exercises to improve both arterial and lymphatic circulation.    Time  2    Period  Weeks    Status  New        PT Long Term Goals - 07/12/19 1058      PT LONG TERM GOAL #1   Title  PT LE to no longer be red or hot to show absense of  cellulitis; wounds to have healed    Time  6    Period  Weeks    Status  New    Target Date  08/23/19   therapy will not begin until ABI and antibiotics are completed     PT LONG TERM GOAL #2   Title  PT volume in LE to be decreased by 3 cm to decrease LE pain to no greater than a 2/10 to allow pt to walk for up to 40 minutes.    Time  6    Period  Weeks    Status  New      PT LONG TERM GOAL #3   Title  PT To be I in self massage    Time  6    Period  Weeks    Status  New      PT LONG TERM GOAL #4   Title  PT to have and be using a compression pump at home    Time  6    Period  Weeks    Status  New      PT LONG TERM GOAL #5   Title  PT to have obtained and be able to don compression garment    Time  6    Period  Weeks    Status  New             Plan - 07/12/19 1033    Clinical Impression Statement  [name] is a 68 yo female who states that she has had increased swelling in both her LE for years.  She has recently been  diagnosed with lymphedema and referred to this clinic.  The patient states that she has  been to see a vein and vascular MD and her veins are not the problem.  She states that she can not feel her feet and has began to have sores.  Upon examination the pt has increased swelling B, increased pain bilaterally,increased heat and redness bilaterlly which are signs and symptoms of cellulitis.  Her LE'd are red with her toes blue and wounds in between each and every toe on both the lateral and medial aspect.   There is a wound on the dorsal aspect of the pt second LT toe of .75 cm 100% eschar. There is a wound on the posterior aspect of the pt Rt leg 1x3 cm covered with a scab.   The examination of this pt does not present as a lymphedema  and I feel further medical work up should be completed prior to proceeding with lymphedema treatments.  The pt states that transportation to this facility will be a problem as she does not drive and she lives in [place].  She states she is homebound therefore home health for wound care may be appropriate, this will be discussed with her MD's office as well as antibiotics and a possible ABI test.  At this time the MD and nursing staff were not available to speak with this therapist.  A call for a nurse to return my call was placed at the MD's office.    Personal Factors and Comorbidities  Age;Comorbidity 3+;Fitness;Time since onset of injury/illness/exacerbation    Comorbidities  RA, s/p Rt tibial platea fx, Lumbar fusion, ORIF Rt wrist,    Examination-Activity Limitations  Stairs;Stand;Locomotion Level;Carry    Examination-Participation Restrictions  Cleaning;Community Activity;Driving;Laundry;Meal Prep;Shop;Church    Stability/Clinical Decision Making  Evolving/Moderate complexity    Clinical Decision Making  Moderate    Rehab Potential  Fair    PT Frequency  3x / week    PT Duration  4 weeks    PT Treatment/Interventions  ADLs/Self Care Home Management;Patient/family  education;Manual lymph drainage;Compression bandaging;Other (comment);Canalith Repostioning   wound care if pt returns (will need order for this )   PT Next Visit Plan  Await ABI results if they are fine we will see if the pt can find transportation to this clinic at this time she does not feel that she will be able to.  If ABI is normal a compression pump from [facility] will be ordered.    PT Home Exercise Plan  LE exercises which will increase LE circulation as well as lymphatic flow    Consulted and Agree with Plan of Care  Patient       Patient will benefit from skilled therapeutic intervention in order to improve the following deficits and impairments:  Decreased activity tolerance, Decreased balance, Decreased skin integrity, Decreased strength, Difficulty walking, Obesity, Pain, Increased edema, Impaired perceived functional ability  Visit Diagnosis: Generalized edema  Open wound of great toe with damage to nail, left, initial encounter  Open wound of great toe with damage to nail, right, initial encounter  Pain in right lower leg  Pain in left lower leg     Problem List Patient Active Problem List   Diagnosis Date Noted  . Multiple thyroid nodules 04/11/2019  . Helicobacter pylori gastritis 10/05/2018  . Morbid obesity ([place]) 05/12/2018  . Hyperglycemia 05/12/2018  . Lower extremity edema -chronic 05/12/2018  . Iron deficiency anemia 05/12/2018  . Erythromelalgia ([place]) 10/17/2017  . Hyperparathyroidism ([place]) 05/06/2017  . Lumbar stenosis with neurogenic claudication 10/11/2016  . H/O breast augmentation 09/12/2016  .  L-S radiculopathy 05/30/2016  . Neck mass 05/30/2015  . Hx of colonic polyp - ssp 11/03/2014  . Neuropathy 07/06/2014  . Hyperlipidemia 06/02/2014  . Anxiety state 06/02/2014  . Allergic rhinitis   . Osteoporosis 10/17/2011  . Rheumatoid arthritis ([place])   . Hypertension     Rayetta Humphrey, [place] CLT [phone] 07/12/2019, 11:05 AM  [place], [place], [address] Phone: [phone]   Fax:  [phone]  Name: [name] MRN: [id number] Date of Birth: July 24, 1951

## 2019-07-12 NOTE — Telephone Encounter (Unsigned)
Copied from [place] [id number]. Topic: General - Other >> Jul 12, 2019 10:37 AM Yvette Rack wrote: Reason for CRM: Azucena Freed with [facility] called to request to speak with a nurse because patient is being seen for lymphedema. Attempted to transfer to NT but there was no answer. Cindy requests call back. Cb# [phone]

## 2019-07-12 NOTE — Telephone Encounter (Signed)
Spoke with the pt and informed her of the message below.  Patient stated she cannot say she will not use ice as this is the only thing that provides relief for her.  Message sent to Dr Ethlyn Gallery.

## 2019-07-12 NOTE — Telephone Encounter (Signed)
-----   Message from Caren Macadam, MD sent at 07/12/2019  1:52 PM EST ----- Please see other message; I just saw this after I had sent previous message asking for more information.   #I have put in order for home health for wound care.  #I have put in order for ABI to be done; I selected [facility] as facility; if she wants elsewhere please change this.  #I did send in doxycycline again for her to help cover for infection.   Advise no putting feet/legs in ice as this will inhibit circulation and thus healing. I am not sure if we will need another face to face visit to get wound care involved. If she is having any worsening of wounds, then she needs to be seen right away. I would suggest she be seen in [place] at the wound clinic if she is able. It would be great to have them assess and start treatment and then she can continue with home health to make sure staying on track with healing.   It sounds like things are worse than when we last spoke. If transportation is an issue, then I prefer to reserve this for wound center if willing (and ok for referral if she would like).  ----- Message ----- From: Leeroy Cha, PT Sent: 07/12/2019  11:13 AM EST To: Caren Macadam, MD  Good morning,  Thank you for the referral of your pt to our lymphedma clinic.  Upon examination of your pt I have some concerns.  Both of her LE are red, hot, swollen and painful.  Her toes are blue.  She has wounds in between each and every toe.   There is a wound on the dorsal aspect of her Left second toe that is covered in 100% eschar and a wound on the posterior aspect of her right LE.    Ms. Portuondo states that she lives in [place] and is unable to find a ride to [place], where our clinic is located; thus the delay in care.  We would be happy to see her for wound care which I feel is a priority at this time, however, she states that at this time she would not be able to physically get here on a regular basis.     At this time I would recommend antibiotics for probable cellulitis of both her LE,an ABI to ensure that there is proper arterial circulation and home health for wound care to both of her .  If you have any further questions please feel free to contact me.   Thanks [employer]

## 2019-07-14 ENCOUNTER — Encounter (HOSPITAL_COMMUNITY): Payer: 59 | Admitting: Physical Therapy

## 2019-07-19 MED FILL — FUROSEMIDE 20 MG TABS: 20 | 30 days supply | Qty: 180 | Fill #1

## 2019-07-20 ENCOUNTER — Other Ambulatory Visit: Payer: Self-pay

## 2019-07-20 ENCOUNTER — Ambulatory Visit (HOSPITAL_COMMUNITY)
Admission: RE | Admit: 2019-07-20 | Discharge: 2019-07-20 | Disposition: A | Discharge status: Home / Self Care | Payer: 59 | Source: Ambulatory Visit | Attending: Family Medicine | Admitting: Family Medicine

## 2019-07-20 DIAGNOSIS — R600 Localized edema: Secondary | ICD-10-CM | POA: Diagnosis not present

## 2019-07-20 DIAGNOSIS — T148XXA Other injury of unspecified body region, initial encounter: Secondary | ICD-10-CM | POA: Diagnosis not present

## 2019-07-20 DIAGNOSIS — G629 Polyneuropathy, unspecified: Secondary | ICD-10-CM | POA: Insufficient documentation

## 2019-07-20 NOTE — Progress Notes (Signed)
ABI completed. Refer to "CV Proc" under chart review to view preliminary results.  07/20/2019 12:16 PM Kelby Aline., MHA, RVT, RDCS, RDMS

## 2019-07-23 ENCOUNTER — Other Ambulatory Visit: Payer: Self-pay | Admitting: Family Medicine

## 2019-07-23 ENCOUNTER — Telehealth: Payer: Self-pay | Admitting: Family Medicine

## 2019-07-23 ENCOUNTER — Other Ambulatory Visit: Payer: Self-pay

## 2019-07-23 DIAGNOSIS — R600 Localized edema: Secondary | ICD-10-CM

## 2019-07-23 DIAGNOSIS — G629 Polyneuropathy, unspecified: Secondary | ICD-10-CM

## 2019-07-23 DIAGNOSIS — T148XXA Other injury of unspecified body region, initial encounter: Secondary | ICD-10-CM

## 2019-07-23 MED ORDER — DOXYCYCLINE HYCLATE 100 MG PO TABS: 100.0000 mg | ORAL_TABLET | Freq: Two times a day (BID) | ORAL | 0 refills | Status: AC

## 2019-07-23 MED ORDER — DOXYCYCLINE HYCLATE 100 MG PO TABS: 100.0000 mg | ORAL_TABLET | Freq: Two times a day (BID) | ORAL | 0 refills | Status: DC

## 2019-07-23 NOTE — Telephone Encounter (Signed)
Please advise on pt's request for refill. Thank you!

## 2019-07-23 NOTE — Telephone Encounter (Signed)
Medication Refill - Medication:  doxycycline (VIBRA-TABS) 100 MG tablet   Has the patient contacted their pharmacy?  Yes advised to call office. Pt would like filled today and a call when filled.   Preferred Pharmacy (with phone number or street name):  [place], [place]  Agent: Please be advised that RX refills may take up to 3 business days. We ask that you follow-up with your pharmacy.

## 2019-07-23 NOTE — Telephone Encounter (Signed)
She really needs an in office visit. I had previously prescribed this at the request of her therapist treating her at the lymphedema clinic due to concern for cellulitis.  I have not seen her lower extremities in office.  I would be okay with giving her a small supply to get through the weekend if she can have a follow-up in the office next week, but I do feel that we should be monitoring this on a regular basis or that she should be getting prescription from the wound clinic and following with them.  Please see if you can get a status update on her legs.

## 2019-07-23 NOTE — Telephone Encounter (Signed)
Spoke with pt and advised. She voiced understanding. She will start abx today. Nothing further needed at this time.

## 2019-07-23 NOTE — Telephone Encounter (Signed)
Spoke with pt and she states [facility] only [facility]. They did not schedule a follow up for her. She completed the abx course and her legs did improve. She took the last dose on a Monday but does not remember if it was this past one or the one before. She states yesterday her legs began to look very red again and she fears the cellulitis is returning. She would like another round of abx. She did schedule an OV for next Friday as that was the soonest she could come in.   Please advise. If rx sent in today please send to [facility]. Thank you!

## 2019-07-23 NOTE — Telephone Encounter (Signed)
See request °

## 2019-07-23 NOTE — Telephone Encounter (Signed)
Ok; I sent refill to pharmacy for her. If worsening before then needs to be seen. It would be helpful as well if she can take pictures now and during this week for comparison sake.

## 2019-07-29 ENCOUNTER — Other Ambulatory Visit: Payer: Self-pay

## 2019-07-30 ENCOUNTER — Ambulatory Visit: Payer: 59 | Admitting: Family Medicine

## 2019-07-30 ENCOUNTER — Encounter: Payer: Self-pay | Admitting: Family Medicine

## 2019-07-30 VITALS — BP 150/90 | HR 76 | Temp 97.2°F | Ht 66.0 in | Wt 260.2 lb

## 2019-07-30 DIAGNOSIS — I890 Lymphedema, not elsewhere classified: Secondary | ICD-10-CM | POA: Diagnosis not present

## 2019-07-30 DIAGNOSIS — I7381 Erythromelalgia: Secondary | ICD-10-CM | POA: Diagnosis not present

## 2019-07-30 DIAGNOSIS — R739 Hyperglycemia, unspecified: Secondary | ICD-10-CM | POA: Diagnosis not present

## 2019-07-30 DIAGNOSIS — S91309A Unspecified open wound, unspecified foot, initial encounter: Secondary | ICD-10-CM

## 2019-07-30 LAB — CBC WITH DIFFERENTIAL/PLATELET
Basophils Absolute: 0 10*3/uL (ref 0.0–0.1)
Basophils Relative: 0.7 % (ref 0.0–3.0)
Eosinophils Absolute: 0.1 10*3/uL (ref 0.0–0.7)
Eosinophils Relative: 1.5 % (ref 0.0–5.0)
HCT: 43.9 % (ref 36.0–46.0)
Hemoglobin: 14.8 g/dL (ref 12.0–15.0)
Lymphocytes Relative: 32.3 % (ref 12.0–46.0)
Lymphs Abs: 1.5 10*3/uL (ref 0.7–4.0)
MCHC: 33.7 g/dL (ref 30.0–36.0)
MCV: 84.6 fl (ref 78.0–100.0)
Monocytes Absolute: 0.5 10*3/uL (ref 0.1–1.0)
Monocytes Relative: 11 % (ref 3.0–12.0)
Neutro Abs: 2.6 10*3/uL (ref 1.4–7.7)
Neutrophils Relative %: 54.5 % (ref 43.0–77.0)
Platelets: 223 10*3/uL (ref 150.0–400.0)
RBC: 5.19 Mil/uL — ABNORMAL HIGH (ref 3.87–5.11)
RDW: 13.9 % (ref 11.5–15.5)
WBC: 4.7 10*3/uL (ref 4.0–10.5)

## 2019-07-30 LAB — COMPREHENSIVE METABOLIC PANEL
ALT: 20 U/L (ref 0–35)
AST: 17 U/L (ref 0–37)
Albumin: 4.3 g/dL (ref 3.5–5.2)
Alkaline Phosphatase: 82 U/L (ref 39–117)
BUN: 14 mg/dL (ref 6–23)
CO2: 29 mEq/L (ref 19–32)
Calcium: 9.6 mg/dL (ref 8.4–10.5)
Chloride: 101 mEq/L (ref 96–112)
Creatinine, Ser: 0.9 mg/dL (ref 0.40–1.20)
GFR: 61.8 mL/min (ref >60.00)
Glucose, Bld: 119 mg/dL — ABNORMAL HIGH (ref 70–99)
Potassium: 3.7 mEq/L (ref 3.5–5.1)
Sodium: 139 mEq/L (ref 135–145)
Total Bilirubin: 0.6 mg/dL (ref 0.2–1.2)
Total Protein: 7.2 g/dL (ref 6.0–8.3)

## 2019-07-30 LAB — SEDIMENTATION RATE: Sed Rate: 18 mm/hr (ref 0–30)

## 2019-07-30 MED ORDER — SULFAMETHOXAZOLE-TRIMETHOPRIM 800-160 MG PO TABS: 2.0000 | ORAL_TABLET | Freq: Two times a day (BID) | ORAL | 0 refills | Status: DC

## 2019-07-30 MED ORDER — LIDOCAINE 5 % EX OINT: TOPICAL_OINTMENT | CUTANEOUS | 2 refills | Status: DC

## 2019-07-30 NOTE — Progress Notes (Signed)
[name] DOB: 07/22/1951 Encounter date: 07/30/2019  This is a 68 y.o. female who presents with Chief Complaint  Patient presents with  . Leg Swelling    patient complains of bilateral LE redness intermittenly x2019, recent visit with PT who recommended office visit, denies a fever, complains of chills occassionally    History of present illness: "I think in all honesty I have had this cellulitis for awhile". Has always paid attention and noticed legs. A year or so ago went to urgent care here due to leg pain and wasn't sure if she had blood clot. Was sent to hospital. Has watched redness in legs since then. Always stays red. Sometimes clears a little but then right back.   When she saw therapist in [place] they told her she had cellulitis.   Doesn't feel like doxycycline did anything for her. If anything she feels like it is getting worse. Gets blisters that rip open; gets these on fleshy parts of her toes. Has husband spray them with new skin to try and protect them.   Very painful. And have been painful for at least a year. Pain radiates up legs. Most tender is in the toes. Ankles are also bad because of the swelling. They bust open on her.   Has been using neosporin with lidocaine, which helps. Has been using voltaren cream. Still icing because this is the only relief that she gets. Trying to avoid putting them in ice, but putting them on ice pack instead when she can't stand discomfort. Really feels like feet are worse without using the ice. Feels like sunburn from within feet. Blistering has been present since beginning.   At beginning was taking the lasix and that was helping with swelling. Still doing the 60mg  in morning. Occasionally will take an extra dose in afternoon and it helps.   Cannot tolerate compression stockings; extremely painful.   Antibiotics have helped in the past. Feels like she isn't on them long enough to knock out issues. Felt like she was able to be  up and moving a little at end of course. Once she ran out it was miserable again.   Wonders if allergy to something?  There was thought of erythromelalgia mentioned by Dr. Posey Pronto. Has followed with vascular as well and they had evaluated but did not find vascular issue. All sx started after shingles. Wondering if anything she is on might worsen.  Feels like she keeps getting bigger. Just not moving like she used to. Can't walk due to pain. Got eliptyical but can't use this. Hasn't been through lymphedema clinic. One she went to at [facility] was not helpful. In [place] it is hard for her to get out that way. Can't drive due to pain. Couldn't do treatment due to appearance of feet.    Allergies  Allergen Reactions  . Amlodipine Besylate Other (See Comments)    Tremors  . Depakote [Divalproex Sodium] Swelling  . Nortriptyline Other (See Comments)    tremors  . Dilaudid [Hydromorphone Hcl]     Headache, muscle tightness   Current Meds  Medication Sig  . acetaminophen (TYLENOL) 650 MG CR tablet Take 650 mg by mouth every 8 (eight) hours as needed for pain.  Marland Kitchen aspirin 81 MG tablet Take 81 mg by mouth at bedtime.   . Cholecalciferol (VITAMIN D) 125 MCG (5000 UT) CAPS Take 5,000 Units by mouth daily.  . diclofenac sodium (VOLTAREN) 1 % GEL Apply 2 g topically 4 (four) times daily as  needed (pain).  Marland Kitchen diphenhydrAMINE HCl (BENADRYL PO) Take by mouth as needed.   . [EXPIRED] doxycycline (VIBRA-TABS) 100 MG tablet Take 1 tablet (100 mg total) by mouth 2 (two) times daily for 7 days.  Marland Kitchen Fexofenadine HCl (MUCINEX ALLERGY PO) Take by mouth daily as needed.  . furosemide (LASIX) 20 MG tablet Take 2-3 tablets (40-60 mg total) by mouth 2 (two) times daily as needed for edema.  . gabapentin (NEURONTIN) 300 MG capsule TAKE 1 CAPSULE BY MOUTH 2 TIMES DAILY. PT NEEDS APPOINTMENT**  . hydroxychloroquine (PLAQUENIL) 200 MG tablet Take 200 mg by mouth 2 (two) times daily.   . hydroxypropyl methylcellulose /  hypromellose (ISOPTO TEARS / GONIOVISC) 2.5 % ophthalmic solution Place 1 drop into both eyes daily.  Marland Kitchen losartan (COZAAR) 100 MG tablet Take 1 tablet (100 mg total) by mouth daily.  . metoprolol succinate (TOPROL-XL) 50 MG 24 hr tablet TAKE 2 TABLETS BY MOUTH DAILY. TAKE WITH OR IMMEDIATELY FOLLOWING A MEAL (Patient taking differently: Take 50 mg by mouth 2 (two) times daily. )  . mupirocin ointment (BACTROBAN) 2 % Apply 1 application topically 2 (two) times daily.  Marland Kitchen sulfaSALAzine (AZULFIDINE) 500 MG EC tablet TAKE 2 TABLETS BY MOUTH TWICE DAILY (Patient taking differently: Take 1,000 mg by mouth 2 (two) times daily. )  . tiZANidine (ZANAFLEX) 2 MG tablet Take 2 mg by mouth every 8 (eight) hours as needed for muscle spasms.    Review of Systems  Constitutional: Negative for chills, fatigue and fever.  Respiratory: Negative for cough, chest tightness, shortness of breath and wheezing.   Cardiovascular: Positive for leg swelling. Negative for chest pain and palpitations.  Skin: Positive for color change and wound.       See HPI.  Feet are hot, red, swollen, painful.    Objective:  BP (!) 150/90 (BP Location: Left Arm, Patient Position: Sitting, Cuff Size: Large)   Pulse 76   Temp (!) 97.2 F (36.2 C) (Temporal)   Ht 5\' 6"  (1.676 m)   Wt 260 lb 3.2 oz (118 kg)   SpO2 98%   BMI 42.00 kg/m   Weight: 260 lb 3.2 oz (118 kg)   BP Readings from Last 3 Encounters:  07/30/19 (!) 150/90  05/13/19 136/84  04/12/19 (!) 174/85   Wt Readings from Last 3 Encounters:  07/30/19 260 lb 3.2 oz (118 kg)  05/17/19 256 lb (116.1 kg)  05/13/19 256 lb (116.1 kg)    Physical Exam Constitutional:      General: She is not in acute distress.    Appearance: She is well-developed.  Cardiovascular:     Rate and Rhythm: Normal rate and regular rhythm.     Pulses:          Dorsalis pedis pulses are 2+ on the right side and 2+ on the left side.     Heart sounds: Normal heart sounds. No murmur. No  friction rub.     Comments: Bilateral lower extremities are edematous.  Feet are very red with bluish discoloration of the toes.  Color does improve with elevation of the extremities, but they do remain red.  Skin is tight and edema is present throughout all toes, feet, and lower extremities up to the knee.  Decreased sensation on the foot.  There is skin breakdown between toes with some maceration between second and third toe on her left foot.  There is some blistering on the dorsal aspect of the second and third toe of left foot.  Pulmonary:     Effort: Pulmonary effort is normal. No respiratory distress.     Breath sounds: Normal breath sounds. No wheezing or rales.  Musculoskeletal:     Right lower leg: 2+ Edema present.     Left lower leg: 2+ Edema present.  Neurological:     Mental Status: She is alert and oriented to person, place, and time.  Psychiatric:        Behavior: Behavior normal.     Assessment/Plan  1. Erythromelalgia (Grainger) I have reviewed notes from previous vascular doctors, neurology.  She has seen rheumatology.  Erythromyalgia does seem to be the most consistent with her current symptoms and findings.  On further review of ABI studies, there has been decrease from triphasic flow 2 years ago to monophasic for her lower extremities, so we'll further assess this with CTA of bilateral lower extremities.  She may need to follow back up with vascular since there has been a worsening in her status since 2 years ago.  We are going to try a topical compounded treatment including lidocaine, ketoprofen, and amitriptyline.  We could consider compounded midodrine if this is not effective.  I have encouraged her to try aspirin 325 mg daily there is some evidence supporting this for pain relief.  She has tried multiple medications for neuropathy and pain and has not tolerated these well.  She has not tolerated Depakote, Cymbalta (swelling), nortriptyline (tremor), Lyrica (swelling).  There  is some evidence to support amitriptyline, misoprostol, sertraline, carbamazepine, beta-blockers (already taking), calcium channel blockers, Cyproheptadine.   - lidocaine (XYLOCAINE) 5 % ointment; Apply sparingly up to TID for pain  Dispense: 60 g; Refill: 2 - CBC with Differential/Platelet; Future - Comprehensive metabolic panel; Future - Sedimentation rate; Future - Sedimentation rate - Comprehensive metabolic panel - CBC with Differential/Platelet  2. Lymphedema I think that treatment in the long-term is going to be complex and difficult, but I do think that seeing wound clinic on a regular basis to help treat wounds and evaluate is important.  There may be benefit from other treatments that they are able to provide to give her some comfort.  Elevation, Lasix will help. - Ambulatory referral to Wound Clinic  3. Wound of foot She really wants to try different antibiotic to see if she can get some relief with pain and redness.  We will try Bactrim which she has tolerated in the past. - Ambulatory referral to [facility]   Return for pending imaging, labs.    [name], MD

## 2019-07-31 ENCOUNTER — Other Ambulatory Visit: Payer: Self-pay | Admitting: Family Medicine

## 2019-07-31 ENCOUNTER — Encounter: Payer: Self-pay | Admitting: Family Medicine

## 2019-07-31 DIAGNOSIS — S91309A Unspecified open wound, unspecified foot, initial encounter: Secondary | ICD-10-CM

## 2019-07-31 DIAGNOSIS — M79672 Pain in left foot: Secondary | ICD-10-CM

## 2019-07-31 DIAGNOSIS — M79671 Pain in right foot: Secondary | ICD-10-CM

## 2019-07-31 DIAGNOSIS — I7381 Erythromelalgia: Secondary | ICD-10-CM

## 2019-08-02 ENCOUNTER — Other Ambulatory Visit (INDEPENDENT_AMBULATORY_CARE_PROVIDER_SITE_OTHER): Payer: 59

## 2019-08-02 DIAGNOSIS — R739 Hyperglycemia, unspecified: Secondary | ICD-10-CM | POA: Diagnosis not present

## 2019-08-02 LAB — HEMOGLOBIN A1C: Hgb A1c MFr Bld: 5.4 % (ref 4.6–6.5)

## 2019-08-02 NOTE — Addendum Note (Signed)
Addended by: Agnes Lawrence on: 08/02/2019 08:31 AM   Modules accepted: Orders

## 2019-08-03 ENCOUNTER — Encounter (HOSPITAL_BASED_OUTPATIENT_CLINIC_OR_DEPARTMENT_OTHER): Payer: 59 | Attending: Internal Medicine | Admitting: Internal Medicine

## 2019-08-03 ENCOUNTER — Other Ambulatory Visit: Payer: Self-pay

## 2019-08-03 DIAGNOSIS — M069 Rheumatoid arthritis, unspecified: Secondary | ICD-10-CM | POA: Diagnosis not present

## 2019-08-03 DIAGNOSIS — S90422A Blister (nonthermal), left great toe, initial encounter: Secondary | ICD-10-CM | POA: Diagnosis not present

## 2019-08-03 DIAGNOSIS — Z888 Allergy status to other drugs, medicaments and biological substances status: Secondary | ICD-10-CM | POA: Diagnosis not present

## 2019-08-03 DIAGNOSIS — Z809 Family history of malignant neoplasm, unspecified: Secondary | ICD-10-CM | POA: Insufficient documentation

## 2019-08-03 DIAGNOSIS — I890 Lymphedema, not elsewhere classified: Secondary | ICD-10-CM | POA: Diagnosis not present

## 2019-08-03 DIAGNOSIS — L97521 Non-pressure chronic ulcer of other part of left foot limited to breakdown of skin: Secondary | ICD-10-CM | POA: Insufficient documentation

## 2019-08-03 DIAGNOSIS — Z87891 Personal history of nicotine dependence: Secondary | ICD-10-CM | POA: Insufficient documentation

## 2019-08-03 DIAGNOSIS — I10 Essential (primary) hypertension: Secondary | ICD-10-CM | POA: Insufficient documentation

## 2019-08-03 DIAGNOSIS — Z883 Allergy status to other anti-infective agents status: Secondary | ICD-10-CM | POA: Insufficient documentation

## 2019-08-03 DIAGNOSIS — Z885 Allergy status to narcotic agent status: Secondary | ICD-10-CM | POA: Insufficient documentation

## 2019-08-03 DIAGNOSIS — Z8349 Family history of other endocrine, nutritional and metabolic diseases: Secondary | ICD-10-CM | POA: Diagnosis not present

## 2019-08-03 DIAGNOSIS — G629 Polyneuropathy, unspecified: Secondary | ICD-10-CM | POA: Diagnosis not present

## 2019-08-03 DIAGNOSIS — Z8249 Family history of ischemic heart disease and other diseases of the circulatory system: Secondary | ICD-10-CM | POA: Insufficient documentation

## 2019-08-03 DIAGNOSIS — S90821A Blister (nonthermal), right foot, initial encounter: Secondary | ICD-10-CM | POA: Diagnosis not present

## 2019-08-03 DIAGNOSIS — S90425A Blister (nonthermal), left lesser toe(s), initial encounter: Secondary | ICD-10-CM | POA: Diagnosis not present

## 2019-08-03 DIAGNOSIS — L97511 Non-pressure chronic ulcer of other part of right foot limited to breakdown of skin: Secondary | ICD-10-CM | POA: Diagnosis not present

## 2019-08-03 DIAGNOSIS — S90424A Blister (nonthermal), right lesser toe(s), initial encounter: Secondary | ICD-10-CM | POA: Diagnosis not present

## 2019-08-03 DIAGNOSIS — M48061 Spinal stenosis, lumbar region without neurogenic claudication: Secondary | ICD-10-CM | POA: Insufficient documentation

## 2019-08-03 MED FILL — FLUCONAZOLE 100 MG TAB: 100 | 7 days supply | Qty: 7 | Fill #0

## 2019-08-03 MED FILL — KETOCONAZOLE 2% CREAM: 2 | 25 days supply | Qty: 60 | Fill #0

## 2019-08-03 NOTE — Progress Notes (Signed)
[name] ([id number]) Visit Report for 08/03/2019 Abuse/Suicide Risk Screen Details Patient Name: Date of Service: [name], [name] 08/03/2019 10:15 AM Medical Record [id number] Patient Account Number: [id number] Date of Birth/Sex: Treating RN: 02/01/1951 (68 y.o. [name] Primary Care Kaziyah Parkison: [name] Other Clinician: Referring Matika Bartell: Treating Machai Desmith/Extender:Robson, [name], [name] in Treatment: 0 Abuse/Suicide Risk Screen Items Answer ABUSE RISK SCREEN: Has anyone close to you tried to hurt or harm you recentlyo No Do you feel uncomfortable with anyone in your familyo No Has anyone forced you do things that you didnt want to doo No Electronic Signature(s) Signed: 08/03/2019 1:45:50 PM By: Deon Pilling Entered By: Deon Pilling on 08/03/2019 10:38:31 -------------------------------------------------------------------------------- Activities of Daily Living Details Patient Name: Date of Service: [name] 08/03/2019 10:15 AM Medical Record [id number] Patient Account Number: [id number] Date of Birth/Sex: Treating RN: 04-13-1951 (68 y.o. [name] Primary Care Joselyne Spake: [name] Other Clinician: Referring Lashonna Rieke: Treating Kiron Osmun/Extender:Robson, [name], [name] in Treatment: 0 Activities of Daily Living Items Answer Activities of Daily Living (Please select one for each item) Drive Automobile Not Able Take Medications Completely Able Use Telephone Completely Able Care for Appearance Completely Able Use Toilet Completely Able Bath / Shower Completely Able Dress Self Completely Able Feed Self Completely Able Walk Completely Able Get In / Out Bed Completely Able Housework Completely Able Prepare Meals Completely Carver Completely Able Shop for Self Completely Able Electronic Signature(s) Signed: 08/03/2019 1:45:50 PM By: Deon Pilling Entered By:  Deon Pilling on 08/03/2019 10:35:46 -------------------------------------------------------------------------------- Education Screening Details Patient Name: Date of Service: Christine Hedger. 08/03/2019 10:15 AM Medical Record [id number] Patient Account Number: [id number] Date of Birth/Sex: Treating RN: 22-Dec-1950 (68 y.o. [name] Primary Care Thu Baggett: [name] Other Clinician: Referring Arijana Narayan: Treating Quaniya Damas/Extender:Robson, [name], [name] in Treatment: 0 Primary Learner Assessed: Patient Learning Preferences/Education Level/Primary Language Learning Preference: Explanation, Demonstration, Printed Material Highest Education Level: High School Preferred Language: English Cognitive Barrier Language Barrier: No Translator Needed: No Memory Deficit: No Emotional Barrier: No Cultural/Religious Beliefs Affecting Medical Care: No Physical Barrier Impaired Vision: Yes Glasses Impaired Hearing: No Decreased Hand dexterity: No Knowledge/Comprehension Knowledge Level: High Comprehension Level: High Ability to understand written High instructions: Ability to understand verbal High instructions: Motivation Anxiety Level: Calm Cooperation: Cooperative Education Importance: Acknowledges Need Interest in Health Problems: Asks Questions Perception: Coherent Willingness to Engage in Self- High Management Activities: Readiness to Engage in Self- High Management Activities: Electronic Signature(s) Signed: 08/03/2019 1:45:50 PM By: Deon Pilling Entered By: Deon Pilling on 08/03/2019 10:36:28 -------------------------------------------------------------------------------- Fall Risk Assessment Details Patient Name: Date of Service: Christine Hedger. 08/03/2019 10:15 AM Medical Record [id number] Patient Account Number: [id number] Date of Birth/Sex: Treating RN: 08-30-1950 (68 y.o. [name] Primary Care Jahmil Macleod:  [name] Other Clinician: Referring Tadarius Maland: Treating Gregorio Worley/Extender:Robson, [name], [name] in Treatment: 0 Fall Risk Assessment Items Have you had 2 or more falls in the last 12 monthso 0 No Have you had any fall that resulted in injury in the last 12 monthso 0 No FALLS RISK SCREEN History of falling - immediate or within 3 months 0 No Secondary diagnosis (Do you have 2 or more medical diagnoseso) 0 No Ambulatory aid None/bed rest/wheelchair/nurse 0 Yes Crutches/cane/walker 0 No Furniture 0 No Intravenous therapy Access/Saline/Heparin Lock 0 No Weak (short steps with or without shuffle, stooped but able to lift head 0 No while walking, may seek support from furniture) Impaired (short steps with shuffle, may have  difficulty arising from chair, 0 No head down, impaired balance) Mental Status Oriented to own ability 0 Yes Overestimates or forgets limitations 0 No Risk Level: Low Risk Score: 0 Electronic Signature(s) Signed: 08/03/2019 1:45:50 PM By: Deon Pilling Entered By: Deon Pilling on 08/03/2019 10:37:31 -------------------------------------------------------------------------------- Foot Assessment Details Patient Name: Date of Service: Christine Hedger. 08/03/2019 10:15 AM Medical Record [id number] Patient Account Number: [id number] Date of Birth/Sex: Treating RN: April 30, 1951 (68 y.o. [name] Primary Care Alvis Edgell: [name] Other Clinician: Referring Elion Hocker: Treating Jourdyn Hasler/Extender:Robson, [name], [name] in Treatment: 0 Foot Assessment Items Site Locations + = Sensation present, - = Sensation absent, C = Callus, U = Ulcer R = Redness, W = Warmth, M = Maceration, PU = Pre-ulcerative lesion F = Fissure, S = Swelling, D = Dryness Assessment Right: Left: Other Deformity: No No Prior Foot Ulcer: No No Prior Amputation: No No Charcot Joint: No No Ambulatory Status: Ambulatory Without  Help Gait: Steady Electronic Signature(s) Signed: 08/03/2019 1:45:50 PM By: Deon Pilling Entered By: Deon Pilling on 08/03/2019 11:11:17 -------------------------------------------------------------------------------- Nutrition Risk Screening Details Patient Name: Date of Service: JOLYNN, CLOUATRE 08/03/2019 10:15 AM Medical Record [id number] Patient Account Number: [id number] Date of Birth/Sex: Treating RN: 1950-11-09 (68 y.o. [name] Primary Care Kensly Bowmer: [name] Other Clinician: Referring Magali Bray: Treating Kamin Niblack/Extender:Robson, [name], [name] in Treatment: 0 Height (in): Weight (lbs): Body Mass Index (BMI): Nutrition Risk Screening Items Score Screening NUTRITION RISK SCREEN: I have an illness or condition that made me change the kind and/or 2 Yes amount of food I eat I eat fewer than two meals per day 0 No I eat few fruits and vegetables, or milk products 0 No I have three or more drinks of beer, liquor or wine almost every day 0 No I have tooth or mouth problems that make it hard for me to eat 0 No I don't always have enough money to buy the food I need 0 No I eat alone most of the time 0 No I take three or more different prescribed or over-the-counter drugs a day 1 Yes 0 No Without wanting to, I have lost or gained 10 pounds in the last six months I am not always physically able to shop, cook and/or feed myself 0 No Nutrition Protocols Good Risk Protocol Provide education on Moderate Risk Protocol 0 nutrition High Risk Proctocol Risk Level: Moderate Risk Score: 3 Electronic Signature(s) Signed: 08/03/2019 1:45:50 PM By: Deon Pilling Entered By: Deon Pilling on 08/03/2019 10:38:07

## 2019-08-04 NOTE — Progress Notes (Signed)
Christine, [name] ([id number]) Visit Report for 08/03/2019 Chief Complaint Document Details Patient Name: Date of Service: Christine, [name] 08/03/2019 10:15 AM Medical Record [id number] Patient Account Number: [id number] Date of Birth/Sex: Treating RN: June 11, 1951 (68 y.o. F) Primary Care Provider: Micheline [name] Other Clinician: Referring Provider: Treating Provider/Extender:[name], [name], [name] in Treatment: 0 Information Obtained from: Patient Chief Complaint 08/03/2019; patient is here for review of wounds on her bilateral feet. Electronic Signature(s) Signed: 08/03/2019 5:53:46 PM By: Christine Ham MD Entered By: [name] on 08/03/2019 12:58:48 -------------------------------------------------------------------------------- HPI Details Patient Name: Date of Service: [name]. 08/03/2019 10:15 AM Medical Record [id number] Patient Account Number: [id number] Date of Birth/Sex: Treating RN: Jan 10, 1951 (68 y.o. F) Primary Care Provider: Micheline [name] Other Clinician: Referring Provider: Treating Provider/Extender:[name], [name], [name] in Treatment: 0 History of Present Illness HPI Description: Admission 08/03/2019 This is a 68 year old woman who tells me that somewhere just over 2 years ago she developed shingles on her lower back. Ever since then she has had symmetric bilateral lower extremity redness swelling and pain involving predominantly her feet but extending up her calfs to just below the knees. She says the pain and burning is severe and she relieves this by immersing her feet in cold water up to 7 times a day. She does not get any relief by any other mechanism. She also has lymphedema and unfortunately cannot tolerate compression hose. She has been to the lymphedema clinic in [place] on 1 occasion. She does however have compression pumps at home that [place] but she  does not use these. Recently she has had primary doctor appointments for redness of the ankles with blisters. She received a course of doxycycline with some effect. She is felt to have erythromelag and is recently been started on an adult strength aspirin 325. She also received a course of Bactrim.ia The patient has had arterial studies done in 2018 at that point she had a ABI of 1.16 on the right and 1.188 on the left. TBI's of 0.79 on the right and 0.77 on the left. Her waveforms were triphasic. More recently on July 20, 2019 she had just ABIs done that showed a ABI in the right of 0.98 and on the left at 0.99. She did not have TBI's however her waveforms were monophasic. I am told that she went to see vascular surgery at 1 point although I could not put my finger on the note. Past medical history; notable for rheumatoid arthritis on Plaquenil and sulfasalazine, lymphedema, neuropathy on gabapentin, lumbar stenosis status post surgery in August, primary hyperparathyroidism undergoing a left superior parathyroid orchidectomy earlier this year. Electronic Signature(s) Signed: 08/03/2019 5:53:46 PM By: Christine Ham MD Entered By: [name] on 08/03/2019 13:04:03 -------------------------------------------------------------------------------- Physical Exam Details Patient Name: Date of Service: Christine, [name] 08/03/2019 10:15 AM Medical Record [id number] Patient Account Number: [id number] Date of Birth/Sex: Treating RN: [birth year] (68 y.o. F) Primary Care Provider: Micheline [name] Other Clinician: Referring Provider: Treating Provider/Extender:Khristie Sak, [name], [name] in Treatment: 0 Constitutional Sitting or standing Blood Pressure is within target range for patient.. Pulse regular and within target range for patient.Marland Kitchen Respirations regular, non-labored and within target range.. Temperature is normal and within the target range for the patient.Marland Kitchen  Appears in no distress. Eyes Conjunctivae clear. No discharge.no icterus. Respiratory work of breathing is normal. Bilateral breath sounds are clear and equal in all lobes with no wheezes, rales or rhonchi.. Cardiovascular Heart rhythm and rate regular, without  murmur or gallop.. Pedal pulses are bounding. Integumentary (Hair, Skin) See below under notes. Neurological Reduced vibration sense in the plantar aspect of both her feet. Psychiatric appears at normal baseline. Notes Wound exam; the patient's anterior tibial area extending right down the dorsal part of her foot are erythematous but not particularly tender. There is edema that is nonpitting. the erythema and edema extends in her dorsal foot. Between her toes there is confluent epithelial loss between all of her toes in the webspace. Macerated and moist. There are actual wounds on the left dorsal first toe and second toe. The edema she has in her calfs is 2-3+ nonpitting. Electronic Signature(s) Signed: 08/03/2019 5:53:46 PM By: Christine Ham MD Entered By: [name] on 08/03/2019 13:07:23 -------------------------------------------------------------------------------- Physician Orders Details Patient Name: Date of Service: [name]. 08/03/2019 10:15 AM Medical Record [id number] Patient Account Number: [id number] Date of Birth/Sex: Treating RN: 10/29/1950 (68 y.o. [name] Primary Care Provider: Micheline [name] Other Clinician: Referring Provider: Treating Provider/Extender:[name], [name], [name] in Treatment: 0 Verbal / Phone Orders: No Diagnosis Coding Follow-up Appointments Return Appointment in 1 week. Dressing Change Frequency Wound #1 Right Toe - Web between 1st and 2nd Change dressing every day. Wound #10 Left,Medial Toe Great Change dressing every day. Wound #11 Left,Anterior Toe Second Change dressing every day. Wound #12 Right,Lateral Toe Fifth Change  dressing every day. Wound #2 Right Toe - Web between 2nd and 3rd Change dressing every day. Wound #3 Right Toe - Web between 3rd and 4th Change dressing every day. Wound #4 Right Toe - Web between 4th and 5th Change dressing every day. Wound #5 Right,Posterior Ankle Change dressing every day. Wound #6 Left Toe - Web between 1st and 2nd Change dressing every day. Wound #7 Left Toe - Web between 2nd and 3rd Change dressing every day. Wound #8 Left Toe - Web between 3rd and 4th Change dressing every day. Wound #9 Left Hand - Web Between 4th and 5th Digit Change dressing every day. Skin Barriers/Peri-Wound Care Wound #1 Right Toe - Web between 1st and 2nd Barrier cream Wound #10 Left,Medial Toe Great Barrier cream Wound #11 Left,Anterior Toe Second Barrier cream Wound #12 Right,Lateral Toe Fifth Barrier cream Wound #2 Right Toe - Web between 2nd and 3rd Barrier cream Wound #3 Right Toe - Web between 3rd and 4th Barrier cream Wound #4 Right Toe - Web between 4th and 5th Barrier cream Wound #5 Right,Posterior Ankle Barrier cream Wound #6 Left Toe - Web between 1st and 2nd Barrier cream Wound #7 Left Toe - Web between 2nd and 3rd Barrier cream Wound #8 Left Toe - Web between 3rd and 4th Barrier cream Wound #9 Left Hand - Web Between 4th and 5th Digit Barrier cream Wound Cleansing Wound #1 Right Toe - Web between 1st and 2nd May shower and wash wound with soap and water. Wound #10 Left,Medial Toe Great May shower and wash wound with soap and water. Wound #11 Left,Anterior Toe Second May shower and wash wound with soap and water. Wound #12 Right,Lateral Toe Fifth May shower and wash wound with soap and water. Wound #2 Right Toe - Web between 2nd and 3rd May shower and wash wound with soap and water. Wound #3 Right Toe - Web between 3rd and 4th May shower and wash wound with soap and water. Wound #4 Right Toe - Web between 4th and 5th May shower and wash wound with soap  and water. Wound #5 Right,Posterior Ankle May shower and  wash wound with soap and water. Wound #6 Left Toe - Web between 1st and 2nd May shower and wash wound with soap and water. Wound #7 Left Toe - Web between 2nd and 3rd May shower and wash wound with soap and water. Wound #8 Left Toe - Web between 3rd and 4th May shower and wash wound with soap and water. Wound #9 Left Hand - Web Between 4th and 5th Digit May shower and wash wound with soap and water. Primary Wound Dressing Calcium Alginate with Silver Other: - ketoconazole Secondary Dressing Kerlix/Rolled Gauze Dry Gauze Patient Medications Allergies: amlodipine, Depakote, nortriptyline, Dilaudid Notifications Medication Indication Start End ketoconazole 08/03/2019 DOSE topical 2 % cream - cream topical to areas between toes dialy Diflucan tin not nearly 08/03/2019 readyea pedis DOSE oral 100 mg tablet - 1 tablet oral daily for 14 days Electronic Signature(s) Signed: 08/03/2019 1:20:39 PM By: Christine Ham MD Entered By: [name] on 08/03/2019 13:20:38 -------------------------------------------------------------------------------- Problem List Details Patient Name: Date of Service: [name]. 08/03/2019 10:15 AM Medical Record [id number] Patient Account Number: [id number] Date of Birth/Sex: Treating RN: 17-Jan-1951 (68 y.o. F) Primary Care Provider: Micheline [name] Other Clinician: Referring Provider: Treating Provider/Extender:Pravin Perezperez, [name], [name] in Treatment: 0 Active Problems ICD-10 Evaluated Encounter Code Description Active Date Today Diagnosis I89.0 Lymphedema, not elsewhere classified 08/03/2019 No Yes L97.511 Non-pressure chronic ulcer of other part of right foot 08/03/2019 No Yes limited to breakdown of skin L97.521 Non-pressure chronic ulcer of other part of left foot 08/03/2019 No Yes limited to breakdown of skin B35.3 Tinea pedis 08/03/2019 No  Yes Inactive Problems Resolved Problems Electronic Signature(s) Signed: 08/03/2019 5:53:46 PM By: Christine Ham MD Entered By: [name] on 08/03/2019 12:52:03 -------------------------------------------------------------------------------- Progress Note Details Patient Name: Date of Service: [name]. 08/03/2019 10:15 AM Medical Record [id number] Patient Account Number: [id number] Date of Birth/Sex: Treating RN: [birth year] (68 y.o. F) Primary Care Provider: Micheline [name] Other Clinician: Referring Provider: Treating Provider/Extender:Gracia Saggese, [name], [name] in Treatment: 0 Subjective Chief Complaint Information obtained from Patient 08/03/2019; patient is here for review of wounds on her bilateral feet. History of Present Illness (HPI) Admission 08/03/2019 This is a 68 year old woman who tells me that somewhere just over 2 years ago she developed shingles on her lower back. Ever since then she has had symmetric bilateral lower extremity redness swelling and pain involving predominantly her feet but extending up her calfs to just below the knees. She says the pain and burning is severe and she relieves this by immersing her feet in cold water up to 7 times a day. She does not get any relief by any other mechanism. She also has lymphedema and unfortunately cannot tolerate compression hose. She has been to the lymphedema clinic in [place] on 1 occasion. She does however have compression pumps at home that [place] but she does not use these. Recently she has had primary doctor appointments for redness of the ankles with blisters. She received a course of doxycycline with some effect. She is felt to have erythromelag and is recently been started on an adult strength aspirin 325. She also received a course of Bactrim.ia The patient has had arterial studies done in 2018 at that point she had a ABI of 1.16 on the right and  1.188 on the left. TBI's of 0.79 on the right and 0.77 on the left. Her waveforms were triphasic. More recently on July 20, 2019 she had just ABIs done that showed a  ABI in the right of 0.98 and on the left at 0.99. She did not have TBI's however her waveforms were monophasic. I am told that she went to see vascular surgery at 1 point although I could not put my finger on the note. Past medical history; notable for rheumatoid arthritis on Plaquenil and sulfasalazine, lymphedema, neuropathy on gabapentin, lumbar stenosis status post surgery in August, primary hyperparathyroidism undergoing a left superior parathyroid orchidectomy earlier this year. Patient History Information obtained from Patient. Allergies amlodipine (Severity: Moderate, Reaction: tremors), Depakote (Severity: Moderate, Reaction: swelling), nortriptyline (Reaction: tremors), Dilaudid (Reaction: HA, muscle tightness) Family History Cancer - Siblings, Hypertension - Father,Siblings, Thyroid Problems - Siblings, No family history of Diabetes, Heart Disease, Hereditary Spherocytosis, Kidney Disease, Lung Disease, Seizures, Stroke, Tuberculosis. Social History Former smoker - quit in the 1980s, Marital Status - Married, Alcohol Use - Never, Drug Use - No History, Caffeine Use - Daily - coffee 1 cup of coffee. Medical History Eyes Denies history of Cataracts, Glaucoma, Optic Neuritis Ear/Nose/Mouth/Throat Patient has history of Chronic sinus problems/congestion Denies history of Middle ear problems Hematologic/Lymphatic Patient has history of Lymphedema Denies history of Anemia, Hemophilia, Human Immunodeficiency Virus, Sickle Cell Disease Respiratory Denies history of Aspiration, Asthma, Chronic Obstructive Pulmonary Disease (COPD), Pneumothorax, Sleep Apnea, Tuberculosis Cardiovascular Patient has history of Hypertension Denies history of Angina, Arrhythmia, Congestive Heart Failure, Coronary Artery Disease, Deep  Vein Thrombosis, Hypotension, Myocardial Infarction, Peripheral Arterial Disease, Peripheral Venous Disease, Phlebitis, Vasculitis Gastrointestinal Denies history of Cirrhosis , Colitis, Crohnoos, Hepatitis A, Hepatitis B, Hepatitis C Endocrine Denies history of Type I Diabetes, Type II Diabetes Genitourinary Denies history of End Stage Renal Disease Immunological Denies history of Lupus Erythematosus, Raynaudoos, Scleroderma Integumentary (Skin) Denies history of History of Burn Musculoskeletal Patient has history of Rheumatoid Arthritis Denies history of Gout, Osteoarthritis, Osteomyelitis Neurologic Patient has history of Neuropathy Denies history of Dementia, Quadriplegia, Paraplegia, Seizure Disorder Oncologic Denies history of Received Chemotherapy, Received Radiation Psychiatric Denies history of Anorexia/bulimia, Confinement Anxiety Hospitalization/Surgery History - hand surgery 2019 at [facility]. - parathyroid gland surgery 2020 [place]. Review of Systems (ROS) Constitutional Symptoms (General Health) Denies complaints or symptoms of Fatigue, Fever, Chills, Marked Weight Change. Eyes Complains or has symptoms of Glasses / Contacts - reading glasses. Denies complaints or symptoms of Dry Eyes, Vision Changes. Ear/Nose/Mouth/Throat Complains or has symptoms of Chronic sinus problems or rhinitis. Respiratory Denies complaints or symptoms of Chronic or frequent coughs, Shortness of Breath. Cardiovascular Denies complaints or symptoms of Chest pain. Gastrointestinal Denies complaints or symptoms of Frequent diarrhea, Nausea, Vomiting. Endocrine Denies complaints or symptoms of Heat/cold intolerance. Genitourinary Denies complaints or symptoms of Frequent urination. Integumentary (Skin) Complains or has symptoms of Wounds - currently feet. Musculoskeletal Denies complaints or symptoms of Muscle Pain, Muscle Weakness. Psychiatric Denies complaints or symptoms of  Claustrophobia, Suicidal. Objective Constitutional Sitting or standing Blood Pressure is within target range for patient.. Pulse regular and within target range for patient.Marland Kitchen Respirations regular, non-labored and within target range.. Temperature is normal and within the target range for the patient.Marland Kitchen Appears in no distress. Vitals Time Taken: 10:40 AM, Height: 66 in, Source: Stated, Weight: 260 lbs, Source: Stated, BMI: 42, Temperature: 98.4 F, Pulse: 73 bpm, Respiratory Rate: 20 breaths/min, Blood Pressure: 116/60 mmHg. Eyes Conjunctivae clear. No discharge.no icterus. Respiratory work of breathing is normal. Bilateral breath sounds are clear and equal in all lobes with no wheezes, rales or rhonchi.. Cardiovascular Heart rhythm and rate regular, without murmur or gallop.. Pedal pulses are  bounding. Neurological Reduced vibration sense in the plantar aspect of both her feet. Psychiatric appears at normal baseline. General Notes: Wound exam; the patient's anterior tibial area extending right down the dorsal part of her foot are erythematous but not particularly tender. There is edema that is nonpitting. the erythema and edema extends in her dorsal foot. Between her toes there is confluent epithelial loss between all of her toes in the webspace. Macerated and moist. There are actual wounds on the left dorsal first toe and second toe. The edema she has in her calfs is 2- 3+ nonpitting. Integumentary (Hair, Skin) See below under notes. Wound #1 status is Open. Original cause of wound was Blister. The wound is located on the Right Toe - Web between 1st and 2nd. The wound measures 3.4cm length x 1.8cm width x 0.1cm depth; 4.807cm^2 area and 0.481cm^3 volume. There is no tunneling or undermining noted. There is a medium amount of serosanguineous drainage noted. The wound margin is distinct with the outline attached to the wound base. There is large (67-100%) red granulation within the wound  bed. There is no necrotic tissue within the wound bed. General Notes: maceration noted. Wound #10 status is Open. Original cause of wound was Blister. The wound is located on the [employer]. The wound measures 0.5cm length x 1cm width x 0.1cm depth; 0.393cm^2 area and 0.039cm^3 volume. There is no tunneling or undermining noted. There is a medium amount of serosanguineous drainage noted. The wound margin is distinct with the outline attached to the wound base. There is large (67-100%) pink, pale, hyper - granulation within the wound bed. There is a small (1-33%) amount of necrotic tissue within the wound bed including Adherent Slough. Wound #11 status is Open. Original cause of wound was Blister. The wound is located on the Left,Anterior Toe Second. The wound measures 0.7cm length x 0.8cm width x 0.1cm depth; 0.44cm^2 area and 0.044cm^3 volume. There is no tunneling or undermining noted. There is a medium amount of serosanguineous drainage noted. The wound margin is distinct with the outline attached to the wound base. There is small (1-33%) red, pink granulation within the wound bed. There is a large (67-100%) amount of necrotic tissue within the wound bed including Adherent Slough. Wound #12 status is Open. Original cause of wound was Blister. The wound is located on the Right,Lateral Toe Fifth. The wound measures 0.2cm length x 0.2cm width x 0.1cm depth; 0.031cm^2 area and 0.003cm^3 volume. There is no tunneling or undermining noted. There is a medium amount of serosanguineous drainage noted. The wound margin is distinct with the outline attached to the wound base. There is large (67-100%) pink, pale granulation within the wound bed. There is no necrotic tissue within the wound bed. Wound #2 status is Open. Original cause of wound was Blister. The wound is located on the Right Toe - Web between 2nd and 3rd. The wound measures 3cm length x 1.7cm width x 0.1cm depth; 4.006cm^2 area  and 0.401cm^3 volume. There is no tunneling or undermining noted. There is a medium amount of serosanguineous drainage noted. The wound margin is distinct with the outline attached to the wound base. There is large (67-100%) red granulation within the wound bed. There is no necrotic tissue within the wound bed. General Notes: maceration noted. Wound #3 status is Open. Original cause of wound was Blister. The wound is located on the Right Toe - Web between 3rd and 4th. The wound measures 2.5cm length x 1.8cm width x  0.1cm depth; 3.534cm^2 area and 0.353cm^3 volume. There is no tunneling or undermining noted. There is a medium amount of serosanguineous drainage noted. The wound margin is distinct with the outline attached to the wound base. There is large (67-100%) red granulation within the wound bed. There is no necrotic tissue within the wound bed. General Notes: maceration noted. Wound #4 status is Open. Original cause of wound was Blister. The wound is located on the Right Toe - Web between 4th and 5th. The wound measures 2cm length x 1cm width x 0.1cm depth; 1.571cm^2 area and 0.157cm^3 volume. There is no tunneling or undermining noted. There is a medium amount of serosanguineous drainage noted. The wound margin is distinct with the outline attached to the wound base. There is large (67-100%) red granulation within the wound bed. There is no necrotic tissue within the wound bed. General Notes: maceration noted. Wound #5 status is Open. Original cause of wound was Blister. The wound is located on the Right,Posterior Ankle. The wound measures 0.1cm length x 1.3cm width x 0.1cm depth; 0.102cm^2 area and 0.01cm^3 volume. There is no tunneling or undermining noted. There is a medium amount of serosanguineous drainage noted. The wound margin is distinct with the outline attached to the wound base. There is no granulation within the wound bed. There is a large (67-100%) amount of necrotic tissue  within the wound bed including Adherent Slough. Wound #6 status is Open. Original cause of wound was Blister. The wound is located on the Left Toe - Web between 1st and 2nd. The wound measures 4cm length x 1cm width x 0.1cm depth; 3.142cm^2 area and 0.314cm^3 volume. There is no tunneling or undermining noted. There is a medium amount of serosanguineous drainage noted. The wound margin is distinct with the outline attached to the wound base. There is large (67-100%) red granulation within the wound bed. There is no necrotic tissue within the wound bed. General Notes: maceration noted. Wound #7 status is Open. Original cause of wound was Blister. The wound is located on the Left Toe - Web between 2nd and 3rd. The wound measures 3cm length x 1cm width x 0.1cm depth; 2.356cm^2 area and 0.236cm^3 volume. There is no tunneling or undermining noted. There is a medium amount of serosanguineous drainage noted. The wound margin is distinct with the outline attached to the wound base. There is large (67-100%) red granulation within the wound bed. There is no necrotic tissue within the wound bed. General Notes: maceration noted. Wound #8 status is Open. Original cause of wound was Blister. The wound is located on the Left Toe - Web between 3rd and 4th. The wound measures 2.5cm length x 1.5cm width x 0.1cm depth; 2.945cm^2 area and 0.295cm^3 volume. There is no tunneling or undermining noted. There is a medium amount of serosanguineous drainage noted. The wound margin is distinct with the outline attached to the wound base. There is large (67-100%) red granulation within the wound bed. There is no necrotic tissue within the wound bed. General Notes: maceration noted. Wound #9 status is Open. Original cause of wound was Blister. The wound is located on the Left Hand - Web Between 4th and 5th Digit. The wound measures 2.5cm length x 2cm width x 0.1cm depth; 3.927cm^2 area and 0.393cm^3 volume. There is no  tunneling or undermining noted. There is a medium amount of serosanguineous drainage noted. The wound margin is distinct with the outline attached to the wound base. There is large (67-100%) red granulation within the  wound bed. There is no necrotic tissue within the wound bed. General Notes: maceration noted. 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 Dressing Change Frequency: Wound #1 Right Toe - Web between 1st and 2nd: Change dressing every day. Wound #10 Left,Medial Toe Great: Change dressing every day. Wound #11 Left,Anterior Toe Second: Change dressing every day. Wound #12 Right,Lateral Toe Fifth: Change dressing every day. Wound #2 Right Toe - Web between 2nd and 3rd: Change dressing every day. Wound #3 Right Toe - Web between 3rd and 4th: Change dressing every day. Wound #4 Right Toe - Web between 4th and 5th: Change dressing every day. Wound #5 Right,Posterior Ankle: Change dressing every day. Wound #6 Left Toe - Web between 1st and 2nd: Change dressing every day. Wound #7 Left Toe - Web between 2nd and 3rd: Change dressing every day. Wound #8 Left Toe - Web between 3rd and 4th: Change dressing every day. Wound #9 Left Hand - Web Between 4th and 5th Digit: Change dressing every day. Skin Barriers/Peri-Wound Care: Wound #1 Right Toe - Web between 1st and 2nd: Barrier cream Wound #10 Left,Medial Toe Great: Barrier cream Wound #11 Left,Anterior Toe Second: Barrier cream Wound #12 Right,Lateral Toe Fifth: Barrier cream Wound #2 Right Toe - Web between 2nd and 3rd: Barrier cream Wound #3 Right Toe - Web between 3rd and 4th: Barrier cream Wound #4 Right Toe - Web between 4th and 5th: Barrier  cream Wound #5 Right,Posterior Ankle: Barrier cream Wound #6 Left Toe - Web between 1st and 2nd: Barrier cream Wound #7 Left Toe - Web between 2nd and 3rd: Barrier cream Wound #8 Left Toe - Web between 3rd and 4th: Barrier cream Wound #9 Left Hand - Web Between 4th and 5th Digit: Barrier cream Wound Cleansing: Wound #1 Right Toe - Web between 1st and 2nd: May shower and wash wound with soap and water. Wound #10 Left,Medial Toe Great: May shower and wash wound with soap and water. Wound #11 Left,Anterior Toe Second: May shower and wash wound with soap and water. Wound #12 Right,Lateral Toe Fifth: May shower and wash wound with soap and water. Wound #2 Right Toe - Web between 2nd and 3rd: May shower and wash wound with soap and water. Wound #3 Right Toe - Web between 3rd and 4th: May shower and wash wound with soap and water. Wound #4 Right Toe - Web between 4th and 5th: May shower and wash wound with soap and water. Wound #5 Right,Posterior Ankle: May shower and wash wound with soap and water. Wound #6 Left Toe - Web between 1st and 2nd: May shower and wash wound with soap and water. Wound #7 Left Toe - Web between 2nd and 3rd: May shower and wash wound with soap and water. Wound #8 Left Toe - Web between 3rd and 4th: May shower and wash wound with soap and water. Wound #9 Left Hand - Web Between 4th and 5th Digit: May shower and wash wound with soap and water. Primary Wound Dressing: Calcium Alginate with Silver Other: - ketoconazole Secondary Dressing: Kerlix/Rolled Gauze Dry Gauze The following medication(s) was prescribed: ketoconazole topical 2 % cream cream topical to areas between toes dialy starting 08/03/2019 Diflucan oral 100 mg tablet 1 tablet oral daily for 14 days for tin not  nearly readyea pedis starting 08/03/2019 1. The patient has bilateral poorly controlled lymphedema. The swelling extends into her dorsal feet associated with very marked erythema  bilaterally. Although we see this type of thing in people with really severe lymphedema i.e. lymphedema with stasis dermatitis I am not completely sure that is what this patient has. I have seen swelling go down the dorsal foot into the toes and be associated with wounds on the toes are just proximal to the toes 2. Erythromelalgia; I have only ever seen 1 case of this in somebody with a myeloproliferative disease. the patient had erythema and pain in the dorsal foot and toes. This was not associated with lymphedema and did not extend up the legs. I am therefore doubtful that this is exactly what she has 3. She has denuded skin uniformly between every one of her webspaces bilaterally. 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 Nevertheless I think she should be empirically treated for tinea pedis at this seems to be a lot of where her pain is located. I will prescribe ketoconazole 2% cream and a 14-day course of Diflucan. 4. I would like to attempt to control her lymphedema. She does not tolerate wraps. She has pumps and I have asked her to use these 3 times a day for 1 hour. Ive also asked her to keep her legs elevated. If we could get some of the swelling out of her lower legs this might help with the erythema 5. I agree with further evaluation of the arterial studies. Her ABIs seem to have decreased versus 2 years ago and she now has monophasic waveforms bilaterally. 6. Sulfasalazine and Plaquenil have many cutaneous adverse reactions. Nevertheless the pattern of this does not really fit well with a drug side effect. I think this is also been reviewed by her rheumatologist. 7. I have asked the patient to stop soaking her feet in cold water 7 times a day. If she has to use something that is cold to perhaps use cooling packs etc. Electronic Signature(s) Signed: 08/03/2019 1:21:51 PM By: Christine Ham MD Entered By:  [name] on 08/03/2019 13:21:50 -------------------------------------------------------------------------------- HxROS Details Patient Name: Date of Service: [name]. 08/03/2019 10:15 AM Medical Record [id number] Patient Account Number: [id number] Date of Birth/Sex: Treating RN: 1950-10-30 (68 y.o. [name] Primary Care Provider: Micheline [name] Other Clinician: Referring Provider: Treating Provider/Extender:Nazarene Bunning, [name], [name] in Treatment: 0 Information Obtained From Patient Constitutional Symptoms (General Health) Complaints and Symptoms: Negative for: Fatigue; Fever; Chills; Marked Weight Change Eyes Complaints and Symptoms: Positive for: Glasses / Contacts - reading glasses Negative for: Dry Eyes; Vision Changes Medical History: Negative for: Cataracts; Glaucoma; Optic Neuritis Ear/Nose/Mouth/Throat Complaints and Symptoms: Positive for: Chronic sinus problems or rhinitis Medical History: Positive for: Chronic sinus problems/congestion Negative for: Middle ear problems Respiratory Complaints and Symptoms: Negative for: Chronic or frequent coughs; Shortness of Breath Medical History: Negative for: Aspiration; Asthma; Chronic Obstructive Pulmonary Disease (COPD); Pneumothorax; Sleep Apnea; Tuberculosis Cardiovascular Complaints and Symptoms: Negative for: Chest pain Medical History: Positive for: Hypertension Negative for: Angina; Arrhythmia; Congestive Heart Failure; Coronary Artery Disease; Deep Vein Thrombosis; Hypotension; Myocardial Infarction; Peripheral Arterial Disease; Peripheral Venous Disease; Phlebitis; Vasculitis Gastrointestinal Complaints and Symptoms: Negative for: Frequent diarrhea; Nausea; Vomiting Medical History: Negative for: Cirrhosis ; Colitis; Crohns; Hepatitis A; Hepatitis B; Hepatitis C Endocrine Complaints and Symptoms: Negative for: Heat/cold intolerance Medical History: Negative  for: Type I Diabetes; Type  II Diabetes Genitourinary Complaints and Symptoms: Negative for: Frequent urination Medical History: Negative for: End Stage Renal Disease Integumentary (Skin) Complaints and Symptoms: Positive for: Wounds - currently feet Medical History: Negative for: History of Burn Musculoskeletal Complaints and Symptoms: Negative for: Muscle Pain; Muscle Weakness Medical History: Positive for: Rheumatoid Arthritis Negative for: Gout; Osteoarthritis; Osteomyelitis Psychiatric Complaints and Symptoms: Negative for: Claustrophobia; Suicidal Medical History: Negative for: Anorexia/bulimia; Confinement Anxiety Hematologic/Lymphatic Medical History: Positive for: Lymphedema Negative for: Anemia; Hemophilia; Human Immunodeficiency Virus; Sickle Cell Disease Immunological Medical History: Negative for: Lupus Erythematosus; Raynauds; Scleroderma Neurologic Medical History: Positive for: Neuropathy Negative for: Dementia; Quadriplegia; Paraplegia; Seizure Disorder Oncologic Medical History: Negative for: Received Chemotherapy; Received Radiation HBO Extended History Items Ear/Nose/Mouth/Throat: Chronic sinus problems/congestion Immunizations Pneumococcal Vaccine: Received Pneumococcal Vaccination: Yes Implantable Devices No devices added Hospitalization / Surgery History Type of Hospitalization/Surgery hand surgery 2019 at [facility] parathyroid gland surgery 2020 [place] Family and Social History Cancer: Yes - Siblings; Diabetes: No; Heart Disease: No; Hereditary Spherocytosis: No; Hypertension: Yes - Father,Siblings; Kidney Disease: No; Lung Disease: No; Seizures: No; Stroke: No; Thyroid Problems: Yes - Siblings; Tuberculosis: No; Former smoker - quit in the 1980s; Marital Status - Married; Alcohol Use: Never; Drug Use: No History; Caffeine Use: Daily - coffee 1 cup of coffee; Financial Concerns: No; Food, Clothing or Shelter Needs: No; Support System  Lacking: No; Transportation Concerns: No Electronic Signature(s) Signed: 08/03/2019 1:45:50 PM By: Deon Pilling Signed: 08/03/2019 5:53:46 PM By: Christine Ham MD Entered By: Deon Pilling on 08/03/2019 10:45:57 -------------------------------------------------------------------------------- SuperBill Details Patient Name: Date of Service: [name]. 08/03/2019 Medical Record [id number] Patient Account Number: [id number] Date of Birth/Sex: Treating RN: [birth year] (68 y.o. 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 B35.3 Tinea pedis Facility Procedures CPT4 Code: [id number] Description: [id number] - WOUND CARE VISIT-LEV 5 EST PT Modifier: Quantity: 1 Physician Procedures CPT4 Code Description: [id number] - WC PHYS LEVEL 4 - NEW PT ICD-10 Diagnosis Description I89.0 Lymphedema, not elsewhere classified L97.511 Non-pressure chronic ulcer of other part of right foot l L97.521 Non-pressure chronic ulcer of other part of  left foot li B35.3 Tinea pedis Modifier: imited to breakd mited to breakdo Quantity: 1 own of skin wn of skin Electronic Signature(s) Signed: 08/03/2019 5:53:46 PM By: Christine Ham MD Signed: 08/04/2019 9:49:12 AM By: Carlene Coria RN Entered By: Carlene Coria on 08/03/2019 14:02:45

## 2019-08-05 MED FILL — sulfaSALAzine 500 MG TABS: 500 | 90 days supply | Qty: 360 | Fill #0

## 2019-08-05 MED FILL — LOSARTAN POTASSIUM 100 MG T: 100 | 30 days supply | Qty: 30 | Fill #4

## 2019-08-05 MED FILL — METOPROLOL SUCCINATE ER 50: 50 | 90 days supply | Qty: 180 | Fill #2

## 2019-08-05 NOTE — Progress Notes (Signed)
TRESE, JAEGERS ([id number]) Visit Report for 08/03/2019 Allergy List Details Patient Name: Date of Service: [name], [name] 08/03/2019 10:15 AM Medical Record [id number] Patient Account Number: [id number] Date of Birth/Sex: Treating RN: 11-24-1950 (68 y.o. Female) Deon Pilling Primary Care Sahaana Weitman: [name] Other Clinician: Referring Darrick Greenlaw: Treating Lillyrose Reitan/Extender:Robson, [name], [name] in Treatment: 0 Allergies Active Allergies amlodipine Reaction: tremors Severity: Moderate Depakote Reaction: swelling Severity: Moderate nortriptyline Reaction: tremors Dilaudid Reaction: HA, muscle tightness Allergy Notes Electronic Signature(s) Signed: 08/03/2019 1:45:50 PM By: Deon Pilling Entered By: Deon Pilling on 08/03/2019 10:35:01 -------------------------------------------------------------------------------- Arrival Information Details Patient Name: Date of Service: Christine Hedger. 08/03/2019 10:15 AM Medical Record [id number] Patient Account Number: [id number] Date of Birth/Sex: Treating RN: 1950/10/16 (68 y.o. Female) Deon Pilling Primary Care Rishawn Walck: [name] Other Clinician: Referring Esmee Fallaw: Treating Sammye Staff/Extender:Robson, [name], [name] in Treatment: 0 Visit Information Patient Arrived: Ambulatory Arrival Time: 10:32 Accompanied By: self Transfer Assistance: None Patient Identification Verified: Yes Secondary Verification Process Yes Completed: Patient Requires Transmission-Based No Precautions: Patient Has Alerts: Yes Patient Alerts: ABI: L 0.99 R0.98 12/15 Electronic Signature(s) Signed: 08/03/2019 1:45:50 PM By: Deon Pilling Entered By: Deon Pilling on 08/03/2019 10:33:33 -------------------------------------------------------------------------------- Clinic Level of Care Assessment Details Patient Name: Date of Service: [name], [name] 08/03/2019 10:15  AM Medical Record [id number] Patient Account Number: [id number] Date of Birth/Sex: Treating RN: 01-30-1951 (68 y.o. Female) Carlene Coria Primary Care Meranda Dechaine: [name] Other Clinician: Referring Render Marley: Treating Teighan Aubert/Extender:Robson, [name], [name] in Treatment: 0 Clinic Level of Care Assessment Items TOOL 2 Quantity Score X - Use when only an EandM is performed on the INITIAL visit 1 0 ASSESSMENTS - Nursing Assessment / Reassessment X - General Physical Exam (combine w/ comprehensive assessment (listed just below) 1 20 when performed on new pt. evals) X - Comprehensive Assessment (HX, ROS, Risk Assessments, Wounds Hx, etc.) 1 25 ASSESSMENTS - Wound and Skin Assessment / Reassessment []  - Simple Wound Assessment / Reassessment - one wound 0 X - Complex Wound Assessment / Reassessment - multiple wounds 12 5 []  - Dermatologic / Skin Assessment (not related to wound area) 0 ASSESSMENTS - Ostomy and/or Continence Assessment and Care []  - Incontinence Assessment and Management 0 []  - Ostomy Care Assessment and Management (repouching, etc.) 0 PROCESS - Coordination of Care X - Simple Patient / Family Education for ongoing care 1 15 []  - Complex (extensive) Patient / Family Education for ongoing care 0 X - Staff obtains Programmer, systems, Records, Test Results / Process Orders 1 10 []  - Staff telephones HHA, Nursing Homes / Clarify orders / etc 0 []  - Routine Transfer to another Facility (non-emergent condition) 0 []  - Routine Hospital Admission (non-emergent condition) 0 []  - New Admissions / Biomedical engineer / Ordering NPWT, Apligraf, etc. 0 []  - Emergency Hospital Admission (emergent condition) 0 X - Simple Discharge Coordination 1 10 []  - Complex (extensive) Discharge Coordination 0 PROCESS - Special Needs []  - Pediatric / Minor Patient Management 0 []  - Isolation Patient Management 0 []  - Hearing / Language / Visual special needs 0 []  -  Assessment of Community assistance (transportation, D/C planning, etc.) 0 []  - Additional assistance / Altered mentation 0 []  - Support Surface(s) Assessment (bed, cushion, seat, etc.) 0 INTERVENTIONS - Wound Cleansing / Measurement X - Wound Imaging (photographs - any number of wounds) 1 5 []  - Wound Tracing (instead of photographs) 0 []  - Simple Wound Measurement - one wound 0 X - Complex Wound Measurement - multiple wounds 12  5 []  - Simple Wound Cleansing - one wound 0 X - Complex Wound Cleansing - multiple wounds 12 5 INTERVENTIONS - Wound Dressings []  - Small Wound Dressing one or multiple wounds 0 []  - Medium Wound Dressing one or multiple wounds 0 X - Large Wound Dressing one or multiple wounds 2 20 []  - Application of Medications - injection 0 INTERVENTIONS - Miscellaneous []  - External ear exam 0 []  - Specimen Collection (cultures, biopsies, blood, body fluids, etc.) 0 []  - Specimen(s) / Culture(s) sent or taken to Lab for analysis 0 []  - Patient Transfer (multiple staff / Harrel Lemon Lift / Similar devices) 0 []  - Simple Staple / Suture removal (25 or less) 0 []  - Complex Staple / Suture removal (26 or more) 0 []  - Hypo / Hyperglycemic Management (close monitor of Blood Glucose) 0 X - Ankle / Brachial Index (ABI) - do not check if billed separately 1 15 Has the patient been seen at the hospital within the last three years: Yes Total Score: 320 Level Of Care: New/Established - Level 5 Electronic Signature(s) Signed: 08/04/2019 9:49:12 AM By: Carlene Coria RN Entered By: Carlene Coria on 08/03/2019 12:06:47 -------------------------------------------------------------------------------- Encounter Discharge Information Details Patient Name: Date of Service: Christine Hedger. 08/03/2019 10:15 AM Medical Record [id number] Patient Account Number: [id number] Date of Birth/Sex: Treating RN: [birth year] (68 y.o. Female) Kela Millin Primary Care Denishia Citro: [name] Other Clinician: Referring Amyjo Mizrachi: Treating Rhea Thrun/Extender:Robson, [name], [name] in Treatment: 0 Encounter Discharge Information Items Discharge Condition: Stable Ambulatory Status: Ambulatory Discharge Destination: Home Transportation: Private Auto Accompanied By: self Schedule Follow-up Appointment: Yes Clinical Summary of Care: Patient Declined Electronic Signature(s) Signed: 08/05/2019 5:33:40 PM By: Kela Millin Entered By: Kela Millin on 08/03/2019 12:34:56 -------------------------------------------------------------------------------- Lower Extremity Assessment Details Patient Name: Date of Service: [name], [name] 08/03/2019 10:15 AM Medical Record [id number] Patient Account Number: [id number] Date of Birth/Sex: Treating RN: 03/30/1951 (68 y.o. Female) Deon Pilling Primary Care Christen Wardrop: [name] Other Clinician: Referring Marypat Kimmet: Treating Cashton Hosley/Extender:Robson, [name], [name] in Treatment: 0 Edema Assessment Assessed: [Left: Yes] [Right: Yes] Edema: [Left: Yes] [Right: Yes] Calf Left: Right: Point of Measurement: 31 cm From Medial Instep 48 cm 47 cm Ankle Left: Right: Point of Measurement: 9 cm From Medial Instep 29 cm 28 cm Vascular Assessment Pulses: Dorsalis Pedis Palpable: [Left:Yes] [Right:Yes] Electronic Signature(s) Signed: 08/03/2019 1:45:50 PM By: Deon Pilling Entered By: Deon Pilling on 08/03/2019 11:11:50 -------------------------------------------------------------------------------- Multi Wound Chart Details Patient Name: Date of Service: Christine Hedger. 08/03/2019 10:15 AM Medical Record [id number] Patient Account Number: [id number] Date of Birth/Sex: Treating RN: April 25, 1951 (68 y.o. Female) Primary Care Jessic Standifer: [name] Other Clinician: Referring Delene Morais: Treating Zedric Deroy/Extender:Robson, [name], [name] in Treatment:  0 Vital Signs Height(in): 34 Pulse(bpm): 72 Weight(lbs): 260 Blood Pressure(mmHg): 116/60 Body Mass Index(BMI): 42 Temperature(F): 98.4 Respiratory 20 Rate(breaths/min): Photos: [1:No Photos] [10:No Photos] [11:No Photos] Wound Location: [1:Right Toe - Web between 1st and 2nd] [10:Left Toe Great - Medial] [11:Left Toe Second - Anterior] Wounding Event: [1:Blister] [10:Blister] [11:Blister] Primary Etiology: [1:Lymphedema] [10:Lymphedema] [11:Lymphedema] Comorbid History: [1:Chronic sinus problems/congestion, Lymphedema, Hypertension, Rheumatoid Arthritis, Neuropathy] [10:Chronic sinus problems/congestion, Lymphedema, Hypertension, Rheumatoid Arthritis, Neuropathy] [11:Chronic sinus problems/congestion,  Lymphedema, Hypertension, Rheumatoid Arthritis, Neuropathy] Date Acquired: [1:07/23/2019] [10:07/23/2019] [11:07/23/2019] Weeks of Treatment: [1:0] [10:0] [11:0] Wound Status: [1:Open] [10:Open] [11:Open] Measurements L x W x D 3.4x1.8x0.1 [10:0.5x1x0.1] [11:0.7x0.8x0.1] (cm) Area (cm) : [1:4.807] [10:0.393] [11:0.44] Volume (cm) : [1:0.481] [10:0.039] [11:0.044] % Reduction in Area: [1:0.00%] [10:0.00%] [11:0.00%] % Reduction in Volume:  0.00% [10:0.00%] [11:0.00%] Classification: [1:Partial Thickness] [10:Full Thickness Without Exposed Support Structures Exposed Support Structures] [11:Full Thickness Without] Exudate Amount: [1:Medium] [10:Medium] [11:Medium] Exudate Type: [1:Serosanguineous] [10:Serosanguineous] [11:Serosanguineous] Exudate Color: [1:red, brown] [10:red, brown] [11:red, brown] Wound Margin: [1:Distinct, outline attached] [10:Distinct, outline attached Distinct, outline attached] Granulation Amount: [1:Large (67-100%)] [10:Large (67-100%)] [11:Small (1-33%)] Granulation Quality: [1:Red] [10:Pink, Pale, Hyper- granulation] [11:Red, Pink] Necrotic Amount: [1:None Present (0%)] [10:Small (1-33%)] [11:Large (67-100%)] Exposed Structures: [1:Fascia: No Fat Layer  (Subcutaneous Tissue) Exposed: No Tendon: No Muscle: No Joint: No Bone: No] [10:Fascia: No Fat Layer (Subcutaneous Fat Layer (Subcutaneous Tissue) Exposed: No Tendon: No Muscle: No Joint: No Bone: No] [11:Fascia: No Tissue)  Exposed: No Tendon: No Muscle: No Joint: No Bone: No] Epithelialization: [1:Small (1-33%)] [10:Small (1-33%)] [11:Small (1-33%)] Assessment Notes: [1:maceration noted. 19] [10:N/A 2] [11:N/A] Photos: [1:No Photos] [10:No Photos] [11:No Photos] Wound Location: [1:Right Toe Fifth - LateralRight Toe - Web between] [10:2nd and 3rd] [11:Right Toe - Web between 3rd and 4th] Wounding Event: [1:Blister] [10:Blister] [11:Blister] Primary Etiology: [1:Lymphedema] [10:Lymphedema] [11:Lymphedema] Comorbid History: [1:Chronic sinus problems/congestion, Lymphedema, Hypertension, Rheumatoid Hypertension, Rheumatoid Arthritis, Neuropathy] [10:Chronic sinus problems/congestion, Lymphedema, Arthritis, Neuropathy] [11:Chronic sinus problems/congestion,  Lymphedema, Hypertension, Rheumatoid Arthritis, Neuropathy] Date Acquired: [1:07/23/2019] [10:07/23/2019] [11:07/23/2019] Weeks of Treatment: [1:0] [10:0] [11:0] Wound Status: [1:Open] [10:Open] [11:Open] Measurements L x W x D 0.2x0.2x0.1 [10:3x1.7x0.1] [11:2.5x1.8x0.1] (cm) Area (cm) : [1:0.031] [10:4.006] [11:3.534] Volume (cm) : [1:0.003] [10:0.401] [11:0.353] % Reduction in Area: [1:0.00%] [10:0.00%] [11:0.00%] % Reduction in Volume: 0.00% [10:0.00%] [11:0.00%] Classification: [1:Full Thickness Without Exposed Support Structures] [10:Partial Thickness] [11:Partial Thickness] Exudate Amount: [1:Medium] [10:Medium] [11:Medium] Exudate Type: [1:Serosanguineous] [10:Serosanguineous] [11:Serosanguineous] Exudate Color: [1:red, brown] [10:red, brown] [11:red, brown] Wound Margin: [1:Distinct, outline attached] [10:Distinct, outline attached] [11:Distinct, outline attached] Granulation Amount: [1:Large (67-100%)] [10:Large (67-100%)] [11:Large  (67-100%)] Granulation Quality: [1:Pink, Pale] [10:Red] [11:Red] Necrotic Amount: [1:None Present (0%)] [10:None Present (0%)] [11:None Present (0%)] Exposed Structures: [1:Fascia: No Fat Layer (Subcutaneous Tissue) Exposed: No Tendon: No Muscle: No Joint: No Bone: No] [10:Fascia: No Fat Layer (Subcutaneous Tissue) Exposed: No Tendon: No Muscle: No Joint: No Bone: No] [11:Fascia: No Fat Layer (Subcutaneous Tissue)  Exposed: No Tendon: No Muscle: No Joint: No Bone: No] Epithelialization: [1:Small (1-33%)] [10:Small (1-33%)] [11:Small (1-33%)] Assessment Notes: [1:N/A 4] [10:maceration noted. 5] [11:maceration noted. 51] Photos: [1:No Photos] [10:No Photos] [11:No Photos] Wound Location: [1:Right Toe - Web between 4th and 5th] [10:Right Ankle - Posterior Left Toe - Web between 1st] [11:and 2nd] Wounding Event: [1:Blister] [10:Blister] [11:Blister] Primary Etiology: [1:Lymphedema] [10:Lymphedema] [11:Lymphedema] Comorbid History: [1:Chronic sinus problems/congestion, Lymphedema, Hypertension, Rheumatoid Arthritis, Neuropathy] [10:Chronic sinus problems/congestion, Lymphedema, Hypertension, Rheumatoid Hypertension, Rheumatoid Arthritis, Neuropathy] [11:Chronic  sinus problems/congestion, Lymphedema, Arthritis, Neuropathy] Date Acquired: [1:07/23/2019] [10:07/23/2019] [11:07/23/2019] Weeks of Treatment: [1:0] [10:0] [11:0] Wound Status: [1:Open] [10:Open] [11:Open] Measurements L x W x D 2x1x0.1 [10:0.1x1.3x0.1] [11:4x1x0.1] (cm) Area (cm) : [1:1.571] [10:0.102] [11:3.142] Volume (cm) : [1:0.157] [10:0.01] [11:0.314] % Reduction in Area: [1:0.00%] [10:0.00%] [11:0.00%] % Reduction in Volume: 0.00% [10:0.00%] [11:0.00%] Classification: [1:Partial Thickness] [10:Full Thickness Without Exposed Support Structures] [11:Partial Thickness] Exudate Amount: [1:Medium] [10:Medium] [11:Medium] Exudate Type: [1:Serosanguineous] [10:Serosanguineous] [11:Serosanguineous] Exudate Color: [1:red, brown] [10:red,  brown] [11:red, brown] Wound Margin: [1:Distinct, outline attached] [10:Distinct, outline attached Distinct, outline attached] Granulation Amount: [1:Large (67-100%)] [10:None Present (0%)] [11:Large (67-100%)] Granulation Quality: [1:Red] [10:N/A] [11:Red] Necrotic Amount: [1:None Present (0%)] [10:Large (67-100%)] [11:None Present (0%)] Exposed Structures: [1:Fascia: No Fat Layer (Subcutaneous Tissue) Exposed: No Tendon: No Muscle: No Joint: No Bone: No] [10:Fascia: No Fat Layer (Subcutaneous Fat Layer (  Subcutaneous Tissue) Exposed: No Tendon: No Muscle: No Joint: No Bone: No] [11:Fascia: No Tissue)  Exposed: No Tendon: No Muscle: No Joint: No Bone: No] Epithelialization: [1:Small (1-33%)] [10:Small (1-33%)] [11:Small (1-33%)] Assessment Notes: [1:maceration noted. 7] [10:N/A 8] [11:maceration noted.] Photos: [1:No Photos] [10:No Photos] [11:No Photos] Wound Location: [1:Left Toe - Web between 2nd Left Toe - Web between 3rd and 3rd] [10:and 4th] [11:Left Hand - Web Between 4th and 5th Digit] Wounding Event: [1:Blister] [10:Blister] [11:Blister] Primary Etiology: [1:Lymphedema] [10:Lymphedema] [11:Lymphedema] Comorbid History: [1:Chronic sinus problems/congestion, Lymphedema, Hypertension, Rheumatoid Arthritis, Neuropathy] [10:Chronic sinus problems/congestion, Lymphedema, Hypertension, Rheumatoid Arthritis, Neuropathy] [11:Chronic sinus problems/congestion,  Lymphedema, Hypertension, Rheumatoid Arthritis, Neuropathy] Date Acquired: [1:07/23/2019] [10:07/23/2019] [11:07/23/2019] Weeks of Treatment: [1:0] [10:0] [11:0] Wound Status: [1:Open] [10:Open] [11:Open] Measurements L x W x D 3x1x0.1 [10:2.5x1.5x0.1] [11:2.5x2x0.1] (cm) Area (cm) : [1:2.356] [10:2.945] [11:3.927] Volume (cm) : [1:0.236] [10:0.295] [11:0.393] % Reduction in Area: [1:0.00%] [10:0.00%] [11:0.00%] % Reduction in Volume: 0.00% [10:0.00%] [11:0.00%] Classification: [1:Partial Thickness] [10:Partial Thickness] [11:Partial  Thickness] Exudate Amount: [1:Medium] [10:Medium] [11:Medium] Exudate Type: [1:Serosanguineous] [10:Serosanguineous] [11:Serosanguineous] Exudate Color: [1:red, brown] [10:red, brown] [11:red, brown] Wound Margin: [1:Distinct, outline attached] [10:Distinct, outline attached] [11:Distinct, outline attached] Granulation Amount: [1:Large (67-100%)] [10:Large (67-100%)] [11:Large (67-100%)] Granulation Quality: [1:Red] [10:Red] [11:Red] Necrotic Amount: [1:None Present (0%)] [10:None Present (0%)] [11:None Present (0%)] Exposed Structures: [1:Fascia: No Fat Layer (Subcutaneous Tissue) Exposed: No Tendon: No Muscle: No Joint: No Bone: No] [10:Fascia: No Fat Layer (Subcutaneous Tissue) Exposed: No Tendon: No Muscle: No Joint: No Bone: No] [11:Fascia: No Fat Layer (Subcutaneous Tissue)  Exposed: No Tendon: No Muscle: No Joint: No Bone: No] Epithelialization: [1:Small (1-33%) maceration noted.] [10:Small (1-33%) maceration noted.] [11:Small (1-33%) maceration noted.] Treatment Notes Wound #1 (Right Toe - Web between 1st and 2nd) 1. Cleanse With Wound Cleanser 2. Periwound Care Barrier cream Other periwound care (specifiy in notes) 3. Primary Dressing Applied Calcium Alginate Ag 4. Secondary Dressing Roll Gauze Notes ketoconozole and barrier cream, silver alginate and conform, tape and use stretch net as sock Wound #10 (Left, Medial Toe Great) 1. Cleanse With Wound Cleanser 2. Periwound Care Barrier cream Other periwound care (specifiy in notes) 3. Primary Dressing Applied Calcium Alginate Ag 4. Secondary Dressing Roll Gauze Notes ketoconozole and barrier cream, silver alginate and conform, tape and use stretch net as sock Wound #11 (Left, Anterior Toe Second) 1. Cleanse With Wound Cleanser 2. Periwound Care Barrier cream Other periwound care (specifiy in notes) 3. Primary Dressing Applied Calcium Alginate Ag 4. Secondary Dressing Roll Gauze Notes ketoconozole and barrier  cream, silver alginate and conform, tape and use stretch net as sock Wound #12 (Right, Lateral Toe Fifth) 1. Cleanse With Wound Cleanser 2. Periwound Care Barrier cream Other periwound care (specifiy in notes) 3. Primary Dressing Applied Calcium Alginate Ag 4. Secondary Dressing Roll Gauze Notes ketoconozole and barrier cream, silver alginate and conform, tape and use stretch net as sock Wound #2 (Right Toe - Web between 2nd and 3rd) 1. Cleanse With Wound Cleanser 2. Periwound Care Barrier cream Other periwound care (specifiy in notes) 3. Primary Dressing Applied Calcium Alginate Ag 4. Secondary Dressing Roll Gauze Notes ketoconozole and barrier cream, silver alginate and conform, tape and use stretch net as sock Wound #3 (Right Toe - Web between 3rd and 4th) 1. Cleanse With Wound Cleanser 2. Periwound Care Barrier cream Other periwound care (specifiy in notes) 3. Primary Dressing Applied Calcium Alginate Ag 4. Secondary Dressing Roll Gauze Notes ketoconozole and barrier cream, silver alginate and conform, tape and  use stretch net as sock Wound #4 (Right Toe - Web between 4th and 5th) 1. Cleanse With Wound Cleanser 2. Periwound Care Barrier cream Other periwound care (specifiy in notes) 3. Primary Dressing Applied Calcium Alginate Ag 4. Secondary Dressing Roll Gauze Notes ketoconozole and barrier cream, silver alginate and conform, tape and use stretch net as sock Wound #5 (Right, Posterior Ankle) 1. Cleanse With Wound Cleanser 2. Periwound Care Barrier cream Other periwound care (specifiy in notes) 3. Primary Dressing Applied Calcium Alginate Ag 4. Secondary Dressing Roll Gauze Notes ketoconozole and barrier cream, silver alginate and conform, tape and use stretch net as sock Wound #6 (Left Toe - Web between 1st and 2nd) 1. Cleanse With Wound Cleanser 2. Periwound Care Barrier cream Other periwound care (specifiy in notes) 3. Primary Dressing  Applied Calcium Alginate Ag 4. Secondary Dressing Roll Gauze Notes ketoconozole and barrier cream, silver alginate and conform, tape and use stretch net as sock Wound #7 (Left Toe - Web between 2nd and 3rd) 1. Cleanse With Wound Cleanser 2. Periwound Care Barrier cream Other periwound care (specifiy in notes) 3. Primary Dressing Applied Calcium Alginate Ag 4. Secondary Dressing Roll Gauze Notes ketoconozole and barrier cream, silver alginate and conform, tape and use stretch net as sock Wound #8 (Left Toe - Web between 3rd and 4th) 1. Cleanse With Wound Cleanser 2. Periwound Care Barrier cream Other periwound care (specifiy in notes) 3. Primary Dressing Applied Calcium Alginate Ag 4. Secondary Dressing Roll Gauze Notes ketoconozole and barrier cream, silver alginate and conform, tape and use stretch net as sock Wound #9 (Left Hand - Web Between 4th and 5th Digit) 1. Cleanse With Wound Cleanser 2. Periwound Care Barrier cream Other periwound care (specifiy in notes) 3. Primary Dressing Applied Calcium Alginate Ag 4. Secondary Dressing Roll Gauze Notes ketoconozole and barrier cream, silver alginate and conform, tape and use stretch net as sock Electronic Signature(s) Signed: 08/03/2019 5:53:46 PM By: Linton Ham MD Entered By: Linton Ham on 08/03/2019 12:52:27 -------------------------------------------------------------------------------- Regal Details Patient Name: Date of Service: Christine Hedger. 08/03/2019 10:15 AM Medical Record [id number] Patient Account Number: [id number] Date of Birth/Sex: Treating RN: [birth year] (68 y.o. Female) Carlene Coria Primary Care Quintarius Ferns: [name] Other Clinician: Referring Tyishia Aune: Treating Deondre Marinaro/Extender:Robson, [name], [name] in Treatment: 0 Active Inactive Wound/Skin Impairment Nursing Diagnoses: Knowledge deficit related to ulceration/compromised  skin integrity Goals: Patient/caregiver will verbalize understanding of skin care regimen Date Initiated: 08/03/2019 Target Resolution Date: 09/03/2019 Goal Status: Active Ulcer/skin breakdown will have a volume reduction of 30% by week 4 Date Initiated: 08/03/2019 Target Resolution Date: 09/03/2019 Goal Status: Active Interventions: Assess patient/caregiver ability to obtain necessary supplies Assess patient/caregiver ability to perform ulcer/skin care regimen upon admission and as needed Assess ulceration(s) every visit Notes: Electronic Signature(s) Signed: 08/04/2019 9:49:12 AM By: Carlene Coria RN Entered By: Carlene Coria on 08/03/2019 11:48:47 -------------------------------------------------------------------------------- Non-Wound Condition Assessment Details Patient Name: Date of Service: [name], [name] 08/03/2019 10:15 AM Medical Record [id number] Patient Account Number: [id number] Date of Birth/Sex: Treating RN: Jun 16, 1951 (68 y.o. Female) Deon Pilling Primary Care Marvell Stavola: [name] Other Clinician: Referring Carime Dinkel: Treating Taje Tondreau/Extender:Robson, [name], [name] in Treatment: 0 Non-Wound Condition: Condition: Cellulitis Location: Leg Side: Bilateral Notes Bilateral legs and feet redness, edematous, and warm to touch. Electronic Signature(s) Signed: 08/03/2019 1:45:50 PM By: Deon Pilling Entered By: Deon Pilling on 08/03/2019 11:23:34 -------------------------------------------------------------------------------- Pain Assessment Details Patient Name: Date of Service: [name], [name] 08/03/2019 10:15 AM Medical Record [id number] Patient Account  Number: [id number] Date of Birth/Sex: Treating RN: 02/06/1951 (68 y.o. Female) Deon Pilling Primary Care Rickell Wiehe: [name] Other Clinician: Referring Jaquavis Felmlee: Treating Kristalynn Coddington/Extender:Robson, [name], [name] in Treatment: 0 Active  Problems Location of Pain Severity and Description of Pain Patient Has Paino Yes Patient Has Paino Yes Site Locations Pain Location: Pain in Ulcers Rate the pain. Current Pain Level: 9 Worst Pain Level: 10 Least Pain Level: 5 Tolerable Pain Level: 8 Character of Pain Describe the Pain: Aching, Burning, Heavy, Sharp Pain Management and Medication Current Pain Management: Medication: Yes Cold Application: No Rest: Yes Massage: No Activity: No T.E.N.S.: No Heat Application: No Leg drop or elevation: No Is the Current Pain Management Adequate: Adequate How does your wound impact your activities of daily livingo Sleep: No Bathing: No Appetite: No Relationship With Others: No Bladder Continence: No Emotions: No Bowel Continence: No Work: No Toileting: No Drive: No Dressing: No Hobbies: No Electronic Signature(s) Signed: 08/03/2019 1:45:50 PM By: Deon Pilling Entered By: Deon Pilling on 08/03/2019 11:22:50 -------------------------------------------------------------------------------- Patient/Caregiver Education Details Patient Name: Christine Hedger 12/15/2020andnbsp10:15 Date of Service: AM Medical Record [id number] Number: Patient Account Number: [id number] Treating RN: Date of Birth/Gender: [birth year] (68 y.o. Carlene Coria Female) Other Clinician: Primary Care Treating Joanne Gavel Physician: Physician/Extender: Referring Physician: Vito Berger in Treatment: 0 Education Assessment Education Provided To: Patient Education Topics Provided Wound/Skin Impairment: Methods: Explain/Verbal Responses: State content correctly Electronic Signature(s) Signed: 08/04/2019 9:49:12 AM By: Carlene Coria RN Entered By: Carlene Coria on 08/03/2019 11:49:01 -------------------------------------------------------------------------------- Wound Assessment Details Patient Name: Date of Service: [name], [name] 08/03/2019 10:15  AM Medical Record [id number] Patient Account Number: [id number] Date of Birth/Sex: Treating RN: 1950/09/06 (68 y.o. Female) Deon Pilling Primary Care Greysen Swanton: [name] Other Clinician: Referring Pammie Chirino: Treating Messiah Ahr/Extender:Robson, [name], [name] in Treatment: 0 Wound Status Wound Number: 1 Primary Lymphedema Etiology: Wound Location: Right Toe - Web between 1st and 2nd Wound Open Status: Wounding Event: Blister Comorbid Chronic sinus problems/congestion, Date Acquired: 07/23/2019 History: Lymphedema, Hypertension, Rheumatoid Weeks Of Treatment: 0 Arthritis, Neuropathy Clustered Wound: No Photos Wound Measurements Length: (cm) 3.4 Width: (cm) 1.8 Depth: (cm) 0.1 Area: (cm) 4.807 Volume: (cm) 0.481 Wound Description Classification: Partial Thickness Wound Margin: Distinct, outline attached Exudate Amount: Medium Exudate Type: Serosanguineous Exudate Color: red, brown Wound Bed Granulation Amount: Large (67-100%) Granulation Quality: Red Necrotic Amount: None Present (0%) Foul Odor After Cleansing: Slough/Fibrino Exposed Structure Fascia Exposed: Fat Layer (Subcutaneous Tissue) Exposed: Tendon Exposed: Muscle Exposed: Joint Exposed: Bone Exposed: % Reduction in Area: 0% % Reduction in Volume: 0% Epithelialization: Small (1-33%) Tunneling: No Undermining: No No No No No No No No No Assessment Notes maceration noted. Treatment Notes Wound #1 (Right Toe - Web between 1st and 2nd) 1. Cleanse With Wound Cleanser 2. Periwound Care Barrier cream Other periwound care (specifiy in notes) 3. Primary Dressing Applied Calcium Alginate Ag 4. Secondary Dressing Roll Gauze Notes ketoconozole and barrier cream, silver alginate and conform, tape and use stretch net as sock Electronic Signature(s) Signed: 08/05/2019 3:39:50 PM By: Mikeal Hawthorne EMT/HBOT Signed: 08/05/2019 5:34:18 PM By: Deon Pilling Previous  Signature: 08/03/2019 1:45:50 PM Version By: Deon Pilling Entered By: Mikeal Hawthorne on 08/05/2019 14:41:23 -------------------------------------------------------------------------------- Wound Assessment Details Patient Name: Date of Service: Christine Hedger. 08/03/2019 10:15 AM Medical Record [id number] Patient Account Number: [id number] Date of Birth/Sex: Treating RN: September 19, 1950 (68 y.o. Female) Deon Pilling Primary Care Silvie Obremski: [name] Other Clinician: Referring Tynetta Bachmann: Treating Takhia Spoon/Extender:Robson, [name], [name] in Treatment:  0 Wound Status Wound Number: 10 Primary Lymphedema Etiology: Wound Location: Left Toe Great - Medial Wound Open Wounding Event: Blister Status: Date Acquired: 07/23/2019 Comorbid Chronic sinus problems/congestion, Weeks Of Treatment: 0 History: Lymphedema, Hypertension, Rheumatoid Clustered Wound: No Clustered Wound: No Arthritis, Neuropathy Photos Wound Measurements Length: (cm) 0.5 % Reduc Width: (cm) 1 % Reduc Depth: (cm) 0.1 Epithel Area: (cm) 0.393 Tunnel Volume: (cm) 0.039 Underm Wound Description Classification: Full Thickness Without Exposed Support Structures Wound Distinct, outline attached Margin: Exudate Medium Amount: Exudate Serosanguineous Type: Exudate red, brown Color: Wound Bed Granulation Amount: Large (67-100%) Granulation Quality: Pink, Pale, Hyper-granulation Necrotic Amount: Small (1-33%) Necrotic Quality: Adherent Slough Foul Odor After Cleansing: No Slough/Fibrino Yes Exposed Structure Fascia Exposed: No Fat Layer (Subcutaneous Tissue) Exposed: No Tendon Exposed: No Muscle Exposed: No Joint Exposed: No Bone Exposed: No tion in Area: 0% tion in Volume: 0% ialization: Small (1-33%) ing: No ining: No Treatment Notes Wound #10 (Left, Medial Toe Great) 1. Cleanse With Wound Cleanser 2. Periwound Care Barrier cream Other periwound care (specifiy in  notes) 3. Primary Dressing Applied Calcium Alginate Ag 4. Secondary Dressing Roll Gauze Notes ketoconozole and barrier cream, silver alginate and conform, tape and use stretch net as sock Electronic Signature(s) Signed: 08/05/2019 3:39:50 PM By: Mikeal Hawthorne EMT/HBOT Signed: 08/05/2019 5:34:18 PM By: Deon Pilling Previous Signature: 08/03/2019 1:45:50 PM Version By: Deon Pilling Entered By: Mikeal Hawthorne on 08/05/2019 14:39:42 -------------------------------------------------------------------------------- Wound Assessment Details Patient Name: Date of Service: Christine Hedger. 08/03/2019 10:15 AM Medical Record [id number] Patient Account Number: [id number] Date of Birth/Sex: Treating RN: 1951-06-01 (68 y.o. Female) Deon Pilling Primary Care Patricia Fargo: [name] Other Clinician: Referring Reonna Finlayson: Treating Ashira Kirsten/Extender:Robson, [name], [name] in Treatment: 0 Wound Status Wound Number: 11 Primary Lymphedema Etiology: Wound Location: Left Toe Second - Anterior Wound Open Wounding Event: Blister Status: Date Acquired: 07/23/2019 Comorbid Chronic sinus problems/congestion, Weeks Of Treatment: 0 History: Lymphedema, Hypertension, Rheumatoid Clustered Wound: No Arthritis, Neuropathy Photos Wound Measurements Length: (cm) 0.7 % Reduct Width: (cm) 0.8 % Reduct Depth: (cm) 0.1 Epitheli Area: (cm) 0.44 Tunneli Volume: (cm) 0.044 Undermi Wound Description Full Thickness Without Exposed Support Foul Odo Classification: Structures Slough/F Wound Distinct, outline attached Margin: Exudate Medium Amount: Exudate Serosanguineous Type: Exudate red, brown Color: Wound Bed Granulation Amount: Small (1-33%) Granulation Quality: Red, Pink Fascia Necrotic Amount: Large (67-100%) Fat La Necrotic Quality: Adherent Slough Tendon Muscle Joint Bone E r After Cleansing: No ibrino Yes Exposed Structure Exposed: No yer (Subcutaneous  Tissue) Exposed: No Exposed: No Exposed: No Exposed: No xposed: No ion in Area: 0% ion in Volume: 0% alization: Small (1-33%) ng: No ning: No Treatment Notes Wound #11 (Left, Anterior Toe Second) 1. Cleanse With Wound Cleanser 2. Periwound Care Barrier cream Other periwound care (specifiy in notes) 3. Primary Dressing Applied Calcium Alginate Ag 4. Secondary Dressing Roll Gauze Notes ketoconozole and barrier cream, silver alginate and conform, tape and use stretch net as sock Electronic Signature(s) Signed: 08/05/2019 3:39:50 PM By: Mikeal Hawthorne EMT/HBOT Signed: 08/05/2019 5:34:18 PM By: Deon Pilling Previous Signature: 08/03/2019 1:45:50 PM Version By: Deon Pilling Entered By: Mikeal Hawthorne on 08/05/2019 14:39:21 -------------------------------------------------------------------------------- Wound Assessment Details Patient Name: Date of Service: Christine Hedger. 08/03/2019 10:15 AM Medical Record [id number] Patient Account Number: [id number] Date of Birth/Sex: Treating RN: 09-26-1950 (68 y.o. Female) Deon Pilling Primary Care Jaquille Kau: [name] Other Clinician: Referring Jonathandavid Marlett: Treating Dawood Spitler/Extender:Robson, [name], [name] in Treatment: 0 Wound Status Wound Number: 12 Primary Lymphedema Etiology: Wound Location: Right  Toe Fifth - Lateral Wound Open Wounding Event: Blister Status: Date Acquired: 07/23/2019 Comorbid Chronic sinus problems/congestion, Weeks Of Treatment: 0 History: Lymphedema, Hypertension, Rheumatoid Clustered Wound: No Arthritis, Neuropathy Photos Wound Measurements Length: (cm) 0.2 % Reduc Width: (cm) 0.2 % Reduc Depth: (cm) 0.1 Epithel Area: (cm) 0.031 Tunnel Volume: (cm) 0.003 Underm Wound Description Classification: Full Thickness Without Exposed Support Structures Wound Distinct, outline attached Margin: Exudate Medium Amount: Exudate Serosanguineous Type: Exudate red,  brown Color: Wound Bed Granulation Amount: Large (67-100%) Granulation Quality: Pink, Pale Necrotic Amount: None Present (0%) Foul Odor After Cleansing: No Slough/Fibrino No Exposed Structure Fascia Exposed: No Fat Layer (Subcutaneous Tissue) Exposed: No Tendon Exposed: No Muscle Exposed: No Joint Exposed: No Bone Exposed: No tion in Area: 0% tion in Volume: 0% ialization: Small (1-33%) ing: No ining: No Treatment Notes Wound #12 (Right, Lateral Toe Fifth) 1. Cleanse With Wound Cleanser 2. Periwound Care Barrier cream Other periwound care (specifiy in notes) 3. Primary Dressing Applied Calcium Alginate Ag 4. Secondary Dressing Roll Gauze Notes ketoconozole and barrier cream, silver alginate and conform, tape and use stretch net as sock Electronic Signature(s) Signed: 08/05/2019 3:39:50 PM By: Mikeal Hawthorne EMT/HBOT Signed: 08/05/2019 5:34:18 PM By: Deon Pilling Previous Signature: 08/03/2019 1:45:50 PM Version By: Deon Pilling Entered By: Mikeal Hawthorne on 08/05/2019 14:38:59 -------------------------------------------------------------------------------- Wound Assessment Details Patient Name: Date of Service: Christine Hedger. 08/03/2019 10:15 AM Medical Record [id number] Patient Account Number: [id number] Date of Birth/Sex: Treating RN: 1951/03/04 (68 y.o. Female) Deon Pilling Primary Care Albie Bazin: [name] Other Clinician: Referring Magdaline Zollars: Treating Keaton Beichner/Extender:Robson, [name], [name] in Treatment: 0 Wound Status Wound Number: 2 Primary Lymphedema Etiology: Wound Location: Right Toe - Web between 2nd and 3rd Wound Open Status: Wounding Event: Blister Comorbid Chronic sinus problems/congestion, Date Acquired: 07/23/2019 History: Lymphedema, Hypertension, Rheumatoid Weeks Of Treatment: 0 Arthritis, Neuropathy Clustered Wound: No Photos Wound Measurements Length: (cm) 3 % Reduction Width: (cm) 1.7 %  Reduction Depth: (cm) 0.1 Epitheliali Area: (cm) 4.006 Tunneling: Volume: (cm) 0.401 Underminin Wound Description Classification: Partial Thickness Foul Odor A Wound Margin: Distinct, outline attached Slough/Fibr Exudate Amount: Medium Exudate Type: Serosanguineous Exudate Color: red, brown Wound Bed Granulation Amount: Large (67-100%) Granulation Quality: Red Fascia Expo Necrotic Amount: None Present (0%) Fat Layer ( Tendon Expo Muscle Expo Joint Expos Bone Expose Assessment Notes maceration noted. Treatment Notes Wound #2 (Right Toe - Web between 2nd and 3rd) 1. Cleanse With Wound Cleanser 2. Periwound Care Barrier cream Other periwound care (specifiy in notes) 3. Primary Dressing Applied Calcium Alginate Ag 4. Secondary Dressing Roll Gauze Notes ketoconozole and barrier cream, silver alginate and conform, tape Electronic Signature(s) Signed: 08/05/2019 3:39:50 PM By: Mikeal Hawthorne EMT/HBOT Signed: 08/05/2019 5:34:18 PM By: Deon Pilling Previous Signature: 08/03/2019 1:45:50 PM Version By: Geronimo Boot Entered By: Mikeal Hawthorne on 12/1 fter Cleansing: No ino No Exposed Structure sed: No Subcutaneous Tissue) Exposed: No sed: No sed: No ed: No d: No and use stretch net as sock bi 02/2019 14:41:46 in Area: 0% in Volume: 0% zation: Small (1-33%) No g: No -------------------------------------------------------------------------------- Wound Assessment Details Patient Name: Date of Service: [name], [name] 08/03/2019 10:15 AM Medical Record [id number] Patient Account Number: [id number] Date of Birth/Sex: Treating RN: Aug 15, 1951 (68 y.o. Female) Deon Pilling Primary Care Adhrit Krenz: [name] Other Clinician: Referring Chauntelle Azpeitia: Treating Laiza Veenstra/Extender:Robson, [name], [name] in Treatment: 0 Wound Status Wound Number: 3 Primary Lymphedema Etiology: Wound Location: Right Toe - Web between 3rd and 4th Wound  Open Status: Wounding Event:  Blister Comorbid Chronic sinus problems/congestion, Date Acquired: 07/23/2019 History: Lymphedema, Hypertension, Rheumatoid Weeks Of Treatment: 0 Arthritis, Neuropathy Clustered Wound: No Photos Wound Measurements Length: (cm) 2.5 % Reduc Width: (cm) 1.8 % Reduc Depth: (cm) 0.1 Epithel Area: (cm) 3.534 Tunnel Volume: (cm) 0.353 Underm Wound Description Classification: Partial Thickness Wound Margin: Distinct, outline attached Exudate Amount: Medium Exudate Type: Serosanguineous Exudate Color: red, brown Wound Bed Granulation Amount: Large (67-100%) Granulation Quality: Red Necrotic Amount: None Present (0%) Foul Odor After Cleansing: No Slough/Fibrino No Exposed Structure Fascia Exposed: No Fat Layer (Subcutaneous Tissue) Exposed: No Tendon Exposed: No Muscle Exposed: No Joint Exposed: No Bone Exposed: No tion in Area: 0% tion in Volume: 0% ialization: Small (1-33%) ing: No ining: No Assessment Notes maceration noted. Treatment Notes Wound #3 (Right Toe - Web between 3rd and 4th) 1. Cleanse With Wound Cleanser 2. Periwound Care Barrier cream Other periwound care (specifiy in notes) 3. Primary Dressing Applied Calcium Alginate Ag 4. Secondary Dressing Roll Gauze Notes ketoconozole and barrier cream, silver alginate and conform, tape and use stretch net as sock Electronic Signature(s) Signed: 08/05/2019 3:39:50 PM By: Mikeal Hawthorne EMT/HBOT Signed: 08/05/2019 5:34:18 PM By: Deon Pilling Previous Signature: 08/03/2019 1:45:50 PM Version By: Deon Pilling Entered By: Mikeal Hawthorne on 08/05/2019 14:42:10 -------------------------------------------------------------------------------- Wound Assessment Details Patient Name: Date of Service: Christine Hedger. 08/03/2019 10:15 AM Medical Record [id number] Patient Account Number: [id number] Date of Birth/Sex: Treating RN: 1951/03/05 (68 y.o. Female) Deon Pilling Primary  Care Zandyr Barnhill: [name] Other Clinician: Referring Fumie Fiallo: Treating Angelice Piech/Extender:Robson, [name], [name] in Treatment: 0 Wound Status Wound Number: 4 Primary Lymphedema Etiology: Wound Location: Right Toe - Web between 4th and 5th Wound Open Status: Wounding Event: Blister Comorbid Chronic sinus problems/congestion, Date Acquired: 07/23/2019 History: Lymphedema, Hypertension, Rheumatoid Weeks Of Treatment: 0 Arthritis, Neuropathy Clustered Wound: No Photos Wound Measurements Length: (cm) 2 % Reduction Width: (cm) 1 % Reduction Depth: (cm) 0.1 Epitheliali Area: (cm) 1.571 Tunneling: Volume: (cm) 0.157 Underminin Wound Description Classification: Partial Thickness Wound Margin: Distinct, outline attached Exudate Amount: Medium Exudate Type: Serosanguineous Exudate Color: red, brown Wound Bed Granulation Amount: Large (67-100%) Granulation Quality: Red Necrotic Amount: None Present (0%) Foul Odor After Cleansing: No Slough/Fibrino No Exposed Structure Fascia Exposed: No Fat Layer (Subcutaneous Tissue) Exposed: No Tendon Exposed: No Muscle Exposed: No Joint Exposed: No Bone Exposed: No in Area: 0% in Volume: 0% zation: Small (1-33%) No g: No Assessment Notes maceration noted. Treatment Notes Wound #4 (Right Toe - Web between 4th and 5th) 1. Cleanse With Wound Cleanser 2. Periwound Care Barrier cream Other periwound care (specifiy in notes) 3. Primary Dressing Applied Calcium Alginate Ag 4. Secondary Dressing Roll Gauze Notes ketoconozole and barrier cream, silver alginate and conform, tape and use stretch net as sock Electronic Signature(s) Signed: 08/05/2019 3:39:50 PM By: Mikeal Hawthorne EMT/HBOT Signed: 08/05/2019 5:34:18 PM By: Deon Pilling Previous Signature: 08/03/2019 1:45:50 PM Version By: Deon Pilling Entered By: Mikeal Hawthorne on 08/05/2019  14:42:35 -------------------------------------------------------------------------------- Wound Assessment Details Patient Name: Date of Service: Christine Hedger. 08/03/2019 10:15 AM Medical Record [id number] Patient Account Number: [id number] Date of Birth/Sex: Treating RN: 1951/07/16 (68 y.o. Female) Deon Pilling Primary Care Jhoana Upham: [name] Other Clinician: Referring Shey Yott: Treating Sonnie Pawloski/Extender:Robson, [name], [name] in Treatment: 0 Wound Status Wound Number: 5 Primary Lymphedema Etiology: Wound Location: Right Ankle - Posterior Wound Open Wounding Event: Blister Status: Date Acquired: 07/23/2019 Comorbid Chronic sinus problems/congestion, Weeks Of Treatment: 0 History: Lymphedema, Hypertension, Rheumatoid Clustered Wound: No Arthritis, Neuropathy  Photos Wound Measurements Length: (cm) 0.1 % Reduc Width: (cm) 1.3 % Reduc Depth: (cm) 0.1 Epithel Area: (cm) 0.102 Tunnel Volume: (cm) 0.01 Underm Wound Description Full Thickness Without Exposed Support Foul O Classification: Structures Slough Wound Distinct, outline attached Margin: Exudate Medium Amount: Exudate Serosanguineous Type: Exudate red, brown Color: Wound Bed Granulation Amount: None Present (0%) Necrotic Amount: Large (67-100%) Fascia Necrotic Quality: Adherent Slough Fat La Tendon Muscle Joint Bone E dor After Cleansing: No /Fibrino Yes Exposed Structure Exposed: No yer (Subcutaneous Tissue) Exposed: No Exposed: No Exposed: No Exposed: No xposed: No tion in Area: 0% tion in Volume: 0% ialization: Small (1-33%) ing: No ining: No Treatment Notes Wound #5 (Right, Posterior Ankle) 1. Cleanse With Wound Cleanser 2. Periwound Care Barrier cream Other periwound care (specifiy in notes) 3. Primary Dressing Applied Calcium Alginate Ag 4. Secondary Dressing Roll Gauze Notes ketoconozole and barrier cream, silver alginate and conform,  tape and use stretch net as sock Electronic Signature(s) Signed: 08/05/2019 3:39:50 PM By: Mikeal Hawthorne EMT/HBOT Signed: 08/05/2019 5:34:18 PM By: Deon Pilling Previous Signature: 08/03/2019 1:45:50 PM Version By: Deon Pilling Entered By: Mikeal Hawthorne on 08/05/2019 14:37:53 -------------------------------------------------------------------------------- Wound Assessment Details Patient Name: Date of Service: Christine Hedger. 08/03/2019 10:15 AM Medical Record [id number] Patient Account Number: [id number] Date of Birth/Sex: Treating RN: Dec 23, 1950 (68 y.o. Female) Deon Pilling Primary Care Axil Copeman: [name] Other Clinician: Referring Maeson Lourenco: Treating Arushi Partridge/Extender:Robson, [name], [name] in Treatment: 0 Wound Status Wound Number: 6 Primary Lymphedema Etiology: Wound Location: Left Toe - Web between 1st and 2nd Wound Open Status: Wounding Event: Blister Comorbid Chronic sinus problems/congestion, Date Acquired: 07/23/2019 History: Lymphedema, Hypertension, Rheumatoid Weeks Of Treatment: 0 Arthritis, Neuropathy Clustered Wound: No Photos Wound Measurements Length: (cm) 4 % Reduc Width: (cm) 1 % Reduc Depth: (cm) 0.1 Epithel Area: (cm) 3.142 Tunnel Volume: (cm) 0.314 Underm Wound Description Classification: Partial Thickness Wound Margin: Distinct, outline attached Exudate Amount: Medium Exudate Type: Serosanguineous Exudate Color: red, brown Wound Bed Granulation Amount: Large (67-100%) Granulation Quality: Red Necrotic Amount: None Present (0%) Foul Odor After Cleansing: No Slough/Fibrino No Exposed Structure Fascia Exposed: No Fat Layer (Subcutaneous Tissue) Exposed: No Tendon Exposed: No Muscle Exposed: No Joint Exposed: No Bone Exposed: No tion in Area: 0% tion in Volume: 0% ialization: Small (1-33%) ing: No ining: No Assessment Notes maceration noted. Treatment Notes Wound #6 (Left Toe - Web between 1st  and 2nd) 1. Cleanse With Wound Cleanser 2. Periwound Care Barrier cream Other periwound care (specifiy in notes) 3. Primary Dressing Applied Calcium Alginate Ag 4. Secondary Dressing Roll Gauze Notes ketoconozole and barrier cream, silver alginate and conform, tape and use stretch net as sock Electronic Signature(s) Signed: 08/05/2019 3:39:50 PM By: Mikeal Hawthorne EMT/HBOT Signed: 08/05/2019 5:34:18 PM By: Deon Pilling Previous Signature: 08/03/2019 1:45:50 PM Version By: Deon Pilling Entered By: Mikeal Hawthorne on 08/05/2019 14:40:28 -------------------------------------------------------------------------------- Wound Assessment Details Patient Name: Date of Service: Christine Hedger. 08/03/2019 10:15 AM Medical Record [id number] Patient Account Number: [id number] Date of Birth/Sex: Treating RN: 04-19-1951 (68 y.o. Female) Deon Pilling Primary Care Nixxon Faria: [name] Other Clinician: Referring Allyiah Gartner: Treating Nayali Talerico/Extender:Robson, [name], [name] in Treatment: 0 Wound Status Wound Number: 7 Primary Lymphedema Etiology: Wound Location: Left Toe - Web between 2nd and 3rd Wound Open Status: Wounding Event: Blister Comorbid Chronic sinus problems/congestion, Date Acquired: 07/23/2019 History: Lymphedema, Hypertension, Rheumatoid Weeks Of Treatment: 0 Arthritis, Neuropathy Clustered Wound: No Photos Wound Measurements Length: (cm) 3 % Reduction Width: (cm) 1 % Reduction Depth: (  cm) 0.1 Epithelializ Area: (cm) 2.356 Tunneling: Volume: (cm) 0.236 Undermining Wound Description Classification: Partial Thickness Foul Odor Af Wound Margin: Distinct, outline attached Slough/Fibri Exudate Amount: Medium Exudate Type: Serosanguineous Exudate Color: red, brown Wound Bed Granulation Amount: Large (67-100%) Granulation Quality: Red Fascia Expos Necrotic Amount: None Present (0%) Fat Layer (S Tendon Expos Muscle Expos Joint  Expose Bone Exposed Assessment Notes maceration noted. Treatment Notes Wound #7 (Left Toe - Web between 2nd and 3rd) 1. Cleanse With Wound Cleanser 2. Periwound Care Barrier cream Other periwound care (specifiy in notes) 3. Primary Dressing Applied Calcium Alginate Ag 4. Secondary Dressing Roll Gauze ter Cleansing: No no No Exposed Structure ed: No ubcutaneous Tissue) Exposed: No ed: No ed: No d: No : No in Area: 0% in Volume: 0% ation: Small (1-33%) No : No Notes ketoconozole and barrier cream, silver alginate and conform, tape and use stretch net as sock Electronic Signature(s) Signed: 08/05/2019 3:39:50 PM By: Mikeal Hawthorne EMT/HBOT Signed: 08/05/2019 5:34:18 PM By: Deon Pilling Previous Signature: 08/03/2019 1:45:50 PM Version By: Deon Pilling Entered By: Mikeal Hawthorne on 08/05/2019 14:40:58 -------------------------------------------------------------------------------- Wound Assessment Details Patient Name: Date of Service: Christine Hedger. 08/03/2019 10:15 AM Medical Record [id number] Patient Account Number: [id number] Date of Birth/Sex: Treating RN: 1951/04/22 (68 y.o. Female) Deon Pilling Primary Care Tashi Andujo: [name] Other Clinician: Referring Braden Deloach: Treating Van Ehlert/Extender:Robson, [name], [name] in Treatment: 0 Wound Status Wound Number: 8 Primary Lymphedema Etiology: Wound Location: Left Toe - Web between 3rd and 4th Wound Open Status: Wounding Event: Blister Comorbid Chronic sinus problems/congestion, Date Acquired: 07/23/2019 History: Lymphedema, Hypertension, Rheumatoid Weeks Of Treatment: 0 Arthritis, Neuropathy Clustered Wound: No Photos Wound Measurements Length: (cm) 2.5 % Reduc Width: (cm) 1.5 % Reduc Depth: (cm) 0.1 Epithel Area: (cm) 2.945 Tunnel Volume: (cm) 0.295 Underm Wound Description Classification: Partial Thickness Wound Margin: Distinct, outline attached Exudate  Amount: Medium Exudate Type: Serosanguineous Exudate Color: red, brown Wound Bed Granulation Amount: Large (67-100%) Granulation Quality: Red Necrotic Amount: None Present (0%) Foul Odor After Cleansing: No Slough/Fibrino No Exposed Structure Fascia Exposed: No Fat Layer (Subcutaneous Tissue) Exposed: No Tendon Exposed: No Muscle Exposed: No Joint Exposed: No Bone Exposed: No tion in Area: 0% tion in Volume: 0% ialization: Small (1-33%) ing: No ining: No Assessment Notes maceration noted. Treatment Notes Wound #8 (Left Toe - Web between 3rd and 4th) 1. Cleanse With Wound Cleanser 2. Periwound Care Barrier cream Other periwound care (specifiy in notes) 3. Primary Dressing Applied Calcium Alginate Ag 4. Secondary Dressing Roll Gauze Notes ketoconozole and barrier cream, silver alginate and conform, tape and use stretch net as sock Electronic Signature(s) Signed: 08/05/2019 3:39:50 PM By: Mikeal Hawthorne EMT/HBOT Signed: 08/05/2019 5:34:18 PM By: Deon Pilling Previous Signature: 08/03/2019 1:45:50 PM Version By: Deon Pilling Entered By: Mikeal Hawthorne on 08/05/2019 14:40:06 -------------------------------------------------------------------------------- Wound Assessment Details Patient Name: Date of Service: Christine Hedger. 08/03/2019 10:15 AM Medical Record [id number] Patient Account Number: [id number] Date of Birth/Sex: Treating RN: [birth year] (68 y.o. Female) Deon Pilling Primary Care Rihana Kiddy: [name] Other Clinician: Referring Devanta Daniel: Treating Justinn Welter/Extender:Robson, [name], [name] in Treatment: 0 Wound Status Wound Number: 9 Primary Lymphedema Etiology: Wound Location: Left Hand - Web Between 4th and 5th Digit Wound Open Status: Wounding Event: Blister Comorbid Chronic sinus problems/congestion, Date Acquired: 07/23/2019 History: Lymphedema, Hypertension, Rheumatoid Weeks Of Treatment: 0 Arthritis,  Neuropathy Clustered Wound: No Wound Measurements Length: (cm) 2.5 % Reduc Width: (cm) 2 % Reduc Depth: (cm) 0.1 Epithel Area: (cm) 3.927 Tunnel Volume: (  cm) 0.393 Underm Wound Description Classification: Partial Thickness Wound Margin: Distinct, outline attached Exudate Amount: Medium Exudate Type: Serosanguineous Exudate Color: red, brown Wound Bed Granulation Amount: Large (67-100%) Granulation Quality: Red Necrotic Amount: None Present (0%) Foul Odor After Cleansing: No Slough/Fibrino No Exposed Structure Fascia Exposed: No Fat Layer (Subcutaneous Tissue) Exposed: No Tendon Exposed: No Muscle Exposed: No Joint Exposed: No Bone Exposed: No tion in Area: 0% tion in Volume: 0% ialization: Small (1-33%) ing: No ining: No Assessment Notes maceration noted. Treatment Notes Wound #9 (Left Hand - Web Between 4th and 5th Digit) 1. Cleanse With Wound Cleanser 2. Periwound Care Barrier cream Other periwound care (specifiy in notes) 3. Primary Dressing Applied Calcium Alginate Ag 4. Secondary Dressing Roll Gauze Notes ketoconozole and barrier cream, silver alginate and conform, tape and use stretch net as sock Electronic Signature(s) Signed: 08/03/2019 1:45:50 PM By: Deon Pilling Entered By: Deon Pilling on 08/03/2019 11:50:05 -------------------------------------------------------------------------------- Vitals Details Patient Name: Date of Service: Christine Hedger. 08/03/2019 10:15 AM Medical Record [id number] Patient Account Number: [id number] Date of Birth/Sex: Treating RN: 08-23-1950 (68 y.o. Female) Deon Pilling Primary Care Kailan Laws: [name] Other Clinician: Referring Simonne Boulos: Treating Lizzie An/Extender:Robson, [name], [name] in Treatment: 0 Vital Signs Time Taken: 10:40 Temperature (F): 98.4 Height (in): 66 Pulse (bpm): 73 Source: Stated Respiratory Rate (breaths/min): 20 Weight (lbs): 260 Blood Pressure  (mmHg): 116/60 Source: Stated Reference Range: 80 - 120 mg / dl Body Mass Index (BMI): 42 Electronic Signature(s) Signed: 08/03/2019 1:45:50 PM By: Deon Pilling Entered By: Deon Pilling on 08/03/2019 11:09:44

## 2019-08-09 ENCOUNTER — Telehealth: Payer: Self-pay | Admitting: *Deleted

## 2019-08-09 NOTE — Telephone Encounter (Signed)
Yes please; I am good with that

## 2019-08-09 NOTE — Telephone Encounter (Signed)
Spoke with Jerrye Beavers in [place] and informed her of the message below.

## 2019-08-09 NOTE — Telephone Encounter (Signed)
Per [name] the patient is scheduled for a  CT Angio lower extremity tomorrow. Per [name] wants to know if PCP wants to change order for Aortic Bifim as this will give a better picture. Please advise

## 2019-08-10 ENCOUNTER — Ambulatory Visit
Admission: RE | Admit: 2019-08-10 | Discharge: 2019-08-10 | Disposition: A | Discharge status: Home / Self Care | Payer: 59 | Source: Ambulatory Visit | Attending: Family Medicine | Admitting: Family Medicine

## 2019-08-10 ENCOUNTER — Encounter (HOSPITAL_BASED_OUTPATIENT_CLINIC_OR_DEPARTMENT_OTHER): Payer: 59 | Admitting: Internal Medicine

## 2019-08-10 ENCOUNTER — Other Ambulatory Visit: Payer: Self-pay

## 2019-08-10 DIAGNOSIS — Z888 Allergy status to other drugs, medicaments and biological substances status: Secondary | ICD-10-CM | POA: Diagnosis not present

## 2019-08-10 DIAGNOSIS — G629 Polyneuropathy, unspecified: Secondary | ICD-10-CM | POA: Diagnosis not present

## 2019-08-10 DIAGNOSIS — S90422A Blister (nonthermal), left great toe, initial encounter: Secondary | ICD-10-CM | POA: Diagnosis not present

## 2019-08-10 DIAGNOSIS — S91309A Unspecified open wound, unspecified foot, initial encounter: Secondary | ICD-10-CM

## 2019-08-10 DIAGNOSIS — M48061 Spinal stenosis, lumbar region without neurogenic claudication: Secondary | ICD-10-CM | POA: Diagnosis not present

## 2019-08-10 DIAGNOSIS — I7381 Erythromelalgia: Secondary | ICD-10-CM

## 2019-08-10 DIAGNOSIS — S90821A Blister (nonthermal), right foot, initial encounter: Secondary | ICD-10-CM | POA: Diagnosis not present

## 2019-08-10 DIAGNOSIS — Z87891 Personal history of nicotine dependence: Secondary | ICD-10-CM | POA: Diagnosis not present

## 2019-08-10 DIAGNOSIS — I10 Essential (primary) hypertension: Secondary | ICD-10-CM | POA: Diagnosis not present

## 2019-08-10 DIAGNOSIS — L97521 Non-pressure chronic ulcer of other part of left foot limited to breakdown of skin: Secondary | ICD-10-CM | POA: Diagnosis not present

## 2019-08-10 DIAGNOSIS — I890 Lymphedema, not elsewhere classified: Secondary | ICD-10-CM | POA: Diagnosis not present

## 2019-08-10 DIAGNOSIS — L97511 Non-pressure chronic ulcer of other part of right foot limited to breakdown of skin: Secondary | ICD-10-CM | POA: Diagnosis not present

## 2019-08-10 DIAGNOSIS — S90425A Blister (nonthermal), left lesser toe(s), initial encounter: Secondary | ICD-10-CM | POA: Diagnosis not present

## 2019-08-10 DIAGNOSIS — M79671 Pain in right foot: Secondary | ICD-10-CM

## 2019-08-10 DIAGNOSIS — I712 Thoracic aortic aneurysm, without rupture: Secondary | ICD-10-CM | POA: Diagnosis not present

## 2019-08-10 DIAGNOSIS — M069 Rheumatoid arthritis, unspecified: Secondary | ICD-10-CM | POA: Diagnosis not present

## 2019-08-10 MED ORDER — IOPAMIDOL (ISOVUE-370) INJECTION 76%
100.0000 mL | Freq: Once | INTRAVENOUS | Status: AC | PRN
  Administered 2019-08-10: 100 mL via INTRAVENOUS

## 2019-08-11 ENCOUNTER — Other Ambulatory Visit: Payer: Self-pay | Admitting: Family Medicine

## 2019-08-11 DIAGNOSIS — N2889 Other specified disorders of kidney and ureter: Secondary | ICD-10-CM

## 2019-08-11 DIAGNOSIS — I712 Thoracic aortic aneurysm, without rupture, unspecified: Secondary | ICD-10-CM

## 2019-08-11 NOTE — Telephone Encounter (Signed)
Message Routed to PCP CMA 

## 2019-08-11 NOTE — Progress Notes (Signed)
Noted  

## 2019-08-11 NOTE — Telephone Encounter (Signed)
Called [facility] and informed Wells Guiles of the information below.  Per Wells Guiles the MRI order is correct and the pt would need a CT angio chest for the thoracic AA.  Message sent to Dr Ethlyn Gallery.

## 2019-08-11 NOTE — Telephone Encounter (Signed)
Noted  

## 2019-08-11 NOTE — Telephone Encounter (Signed)
I feel like we might be getting confused with info from previous and what is needed now, so please clarify with [facility] imaging so we make sure we get proper eval.    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 Radiology suggested MRI for this since in follow up to the imaging that she just completed.  2. Next year she will need a follow up for the thoracic aortic aneurysm.   I am ok with their recommendations on these two needs, but wanted to make sure we didn't leave something out. Thanks!

## 2019-08-11 NOTE — Telephone Encounter (Signed)
Great; thanks for your help. I changed to  MRA chest.

## 2019-08-11 NOTE — Telephone Encounter (Signed)
When read by radiology they suggested MRA or CTA; I just selected MRA but if they suggest CTA that is fine. Not due until next year so shouldn't need to be changed right now.

## 2019-08-11 NOTE — Progress Notes (Addendum)
DALIDA, WLODARSKI ([id number]) Visit Report for 08/10/2019 Arrival Information Details Patient Name: Date of Service: NEVEA, SARTORIUS 08/10/2019 2:00 PM Medical Record [id number] Patient Account Number: [id number] Date of Birth/Sex: Treating RN: 10-08-1950 (68 y.o. Female) Dwiggins, Larene Beach Primary Care Velvie Thomaston: [name] Other Clinician: Referring Jailyne Chieffo: Treating Aritha Huckeba/Extender:Robson, [name], [name] in Treatment: 1 Visit Information History Since Last Visit Added or deleted any medications: No Patient Arrived: Ambulatory Any new allergies or adverse reactions: No Arrival Time: 14:28 Had a fall or experienced change in No Accompanied By: self activities of daily living that may affect Transfer Assistance: None risk of falls: Patient Identification Verified: Yes Signs or symptoms of abuse/neglect since last No Secondary Verification Process Yes visito Completed: Hospitalized since last visit: No Patient Requires Transmission- No Implantable device outside of the clinic excluding No Based Precautions: cellular tissue based products placed in the center Patient Has Alerts: Yes ABI: L 0.99 R0.98 since last visit: Patient Alerts: Has Dressing in Place as Prescribed: Yes 12/15 Pain Present Now: Yes Electronic Signature(s) Signed: 08/11/2019 4:54:21 PM By: Kela Millin Entered By: Kela Millin on 08/10/2019 14:28:18 -------------------------------------------------------------------------------- Clinic Level of Care Assessment Details Patient Name: Date of Service: ZAVEAH, SMOTHERMON 08/10/2019 2:00 PM Medical Record [id number] Patient Account Number: [id number] Date of Birth/Sex: Treating RN: 1951/06/16 (68 y.o. Female) Epps, Kaufman Primary Care Connie Lasater: [name] Other Clinician: Referring Jourdyn Ferrin: Treating Hamilton Marinello/Extender:Robson, [name], [name] in Treatment: 1 Clinic Level of Care  Assessment Items TOOL 4 Quantity Score X - Use when only an EandM is performed on FOLLOW-UP visit 1 0 ASSESSMENTS - Nursing Assessment / Reassessment X - Reassessment of Co-morbidities (includes updates in patient status) 1 10 X - Reassessment of Adherence to Treatment Plan 1 5 ASSESSMENTS - Wound and Skin Assessment / Reassessment []  - Simple Wound Assessment / Reassessment - one wound 0 X - Complex Wound Assessment / Reassessment - multiple wounds 12 5 []  - Dermatologic / Skin Assessment (not related to wound area) 0 ASSESSMENTS - Focused Assessment []  - Circumferential Edema Measurements - multi extremities 0 []  - Nutritional Assessment / Counseling / Intervention 0 []  - Lower Extremity Assessment (monofilament, tuning fork, pulses) 0 []  - Peripheral Arterial Disease Assessment (using hand held doppler) 0 ASSESSMENTS - Ostomy and/or Continence Assessment and Care []  - Incontinence Assessment and Management 0 []  - Ostomy Care Assessment and Management (repouching, etc.) 0 PROCESS - Coordination of Care X - Simple Patient / Family Education for ongoing care 1 15 []  - Complex (extensive) Patient / Family Education for ongoing care 0 X - Staff obtains Programmer, systems, Records, Test Results / Process Orders 1 10 []  - Staff telephones HHA, Nursing Homes / Clarify orders / etc 0 []  - Routine Transfer to another Facility (non-emergent condition) 0 []  - Routine Hospital Admission (non-emergent condition) 0 []  - New Admissions / Biomedical engineer / Ordering NPWT, Apligraf, etc. 0 []  - Emergency Hospital Admission (emergent condition) 0 X - Simple Discharge Coordination 1 10 []  - Complex (extensive) Discharge Coordination 0 PROCESS - Special Needs []  - Pediatric / Minor Patient Management 0 []  - Isolation Patient Management 0 []  - Hearing / Language / Visual special needs 0 []  - Assessment of Community assistance (transportation, D/C planning, etc.) 0 []  - Additional assistance / Altered  mentation 0 []  - Support Surface(s) Assessment (bed, cushion, seat, etc.) 0 INTERVENTIONS - Wound Cleansing / Measurement []  - Simple Wound Cleansing - one wound 0 X - Complex Wound Cleansing - multiple wounds  12 5 X - Wound Imaging (photographs - any number of wounds) 1 5 []  - Wound Tracing (instead of photographs) 0 []  - Simple Wound Measurement - one wound 0 X - Complex Wound Measurement - multiple wounds 12 5 INTERVENTIONS - Wound Dressings []  - Small Wound Dressing one or multiple wounds 0 []  - Medium Wound Dressing one or multiple wounds 0 X - Large Wound Dressing one or multiple wounds 2 20 X - Application of Medications - topical 1 5 []  - Application of Medications - injection 0 INTERVENTIONS - Miscellaneous []  - External ear exam 0 []  - Specimen Collection (cultures, biopsies, blood, body fluids, etc.) 0 []  - Specimen(s) / Culture(s) sent or taken to Lab for analysis 0 []  - Patient Transfer (multiple staff / Civil Service fast streamer / Similar devices) 0 []  - Simple Staple / Suture removal (25 or less) 0 []  - Complex Staple / Suture removal (26 or more) 0 []  - Hypo / Hyperglycemic Management (close monitor of Blood Glucose) 0 []  - Ankle / Brachial Index (ABI) - do not check if billed separately 0 X - Vital Signs 1 5 Has the patient been seen at the hospital within the last three years: Yes Total Score: 285 Level Of Care: New/Established - Level 5 Electronic Signature(s) Signed: 08/11/2019 11:01:46 AM By: Carlene Coria RN Entered By: Carlene Coria on 08/10/2019 16:01:37 -------------------------------------------------------------------------------- Lower Extremity Assessment Details Patient Name: Date of Service: IZELLE, MALON 08/10/2019 2:00 PM Medical Record [id number] Patient Account Number: [id number] Date of Birth/Sex: Treating RN: 04-21-1951 (68 y.o. Female) Kela Millin Primary Care Jeanetta Alonzo: [name] Other Clinician: Referring Alontae Chaloux: Treating  Jamespaul Secrist/Extender:Robson, [name], [name] in Treatment: 1 Edema Assessment Assessed: [Left: No] [Right: No] Edema: [Left: Yes] [Right: Yes] Calf Left: Right: Point of Measurement: 31 cm From Medial Instep 45 cm 42.5 cm Ankle Left: Right: Point of Measurement: 9 cm From Medial Instep 28.5 cm 28.5 cm Vascular Assessment Pulses: Dorsalis Pedis Palpable: [Left:Yes] [Right:Yes] Electronic Signature(s) Signed: 08/11/2019 4:54:21 PM By: Kela Millin Entered By: Kela Millin on 08/10/2019 14:30:23 -------------------------------------------------------------------------------- Multi Wound Chart Details Patient Name: Date of Service: [name] 08/10/2019 2:00 PM Medical Record [id number] Patient Account Number: [id number] Date of Birth/Sex: Treating RN: September 05, 1950 (68 y.o. Female) Primary Care Ahmoni Edge: [name] Other Clinician: Referring Jeily Guthridge: Treating Johnnette Laux/Extender:Robson, [name], [name] in Treatment: 1 Vital Signs Height(in): 66 Pulse(bpm): 55 Weight(lbs): 260 Blood Pressure(mmHg): 189/79 Body Mass Index(BMI): 42 Temperature(F): 98.4 Respiratory 19 Rate(breaths/min): Photos: [1:No Photos] [10:No Photos] [11:No Photos] Wound Location: [1:Right Toe - Web between 1st and 2nd] [10:Left Toe Great - Medial] [11:Left Toe Second - Anterior] Wounding Event: [1:Blister] [10:Blister] [11:Blister] Primary Etiology: [1:Lymphedema] [10:Lymphedema] [11:Lymphedema] Comorbid History: [1:Chronic sinus problems/congestion, Lymphedema, Hypertension, Rheumatoid Arthritis, Neuropathy] [10:Chronic sinus problems/congestion, Lymphedema, Hypertension, Rheumatoid Arthritis, Neuropathy] [11:Chronic sinus problems/congestion,  Lymphedema, Hypertension, Rheumatoid Arthritis, Neuropathy] Date Acquired: [1:07/23/2019] [10:07/23/2019] [11:07/23/2019] Weeks of Treatment: [1:1] [10:1] [11:1] Wound Status: [1:Open] [10:Open]  [11:Open] Measurements L x W x D 4.5x1x0.1 [10:0.4x0.5x0.1] [11:0.7x0.7x0.1] (cm) Area (cm) : [1:3.534] [10:0.157] [11:0.385] Volume (cm) : [1:0.353] [10:0.016] [11:0.038] % Reduction in Area: [1:26.50%] [10:60.10%] [11:12.50%] % Reduction in Volume: 26.60% [10:59.00%] [11:13.60%] Classification: [1:Partial Thickness] [10:Full Thickness Without Exposed Support Structures Exposed Support Structures] [11:Full Thickness Without] Exudate Amount: [1:Medium] [10:Medium] [11:Medium] Exudate Type: [1:Serosanguineous] [10:Serosanguineous] [11:Serosanguineous] Exudate Color: [1:red, brown] [10:red, brown] [11:red, brown] Wound Margin: [1:Distinct, outline attached] [10:Distinct, outline attached Distinct, outline attached] Granulation Amount: [1:Large (67-100%)] [10:Large (67-100%)] [11:Small (1-33%)] Granulation Quality: [1:Red] [10:Pink, Pale, Hyper- granulation] [  11:Red, Pink] Necrotic Amount: [1:None Present (0%)] [10:Small (1-33%)] [11:Large (67-100%)] Exposed Structures: [1:Fat Layer (Subcutaneous Tissue) Exposed: Yes Fascia: No Tendon: No Muscle: No Joint: No Bone: No] [10:Fat Layer (Subcutaneous Fat Layer (Subcutaneous Tissue) Exposed: Yes Fascia: No Tendon: No Muscle: No Joint: No Bone: No] [11:Tissue) Exposed: Yes  Fascia: No Tendon: No Muscle: No Joint: No Bone: No] Epithelialization: [1:Small (1-33%)] [10:Small (1-33%) 12 13] [11:Small (1-33%) 2] Photos: [1:No Photos] [10:No Photos] [11:No Photos] Wound Location: [1:Right Toe Fifth - Lateral] [10:Right Toe Third] [11:Right Toe - Web between 2nd and 3rd] Wounding Event: [1:Blister] [10:Gradually Appeared] [11:Blister] Primary Etiology: [1:Lymphedema] [10:Inflammatory] [11:Lymphedema] Comorbid History: [1:Chronic sinus problems/congestion, Lymphedema, Hypertension, Rheumatoid Hypertension, Rheumatoid Hypertension, Rheumatoid Arthritis, Neuropathy] [10:Chronic sinus problems/congestion, Lymphedema, Arthritis, Neuropathy] [11:Chronic  sinus  problems/congestion, Lymphedema, Arthritis, Neuropathy] Date Acquired: [1:07/23/2019] [10:08/10/2019] [11:07/23/2019] Weeks of Treatment: [1:1] [10:0] [11:1] Wound Status: [1:Open] [10:Open] [11:Open] Measurements L x W x D 0x0x0 [10:0.3x0.7x0.1] [11:3.5x1.3x0.1] (cm) Area (cm) : [1:0] [10:0.165] [11:3.574] Volume (cm) : [1:0] [10:0.016] [11:0.357] % Reduction in Area: [1:100.00%] [10:N/A] [11:10.80%] % Reduction in Volume: 100.00% [10:N/A] [11:11.00%] Classification: [1:Full Thickness Without Exposed Support Structures Exposed Support Structures] [10:Full Thickness Without] [11:Partial Thickness] Exudate Amount: [1:Medium] [10:Small] [11:Medium] Exudate Type: [1:Serosanguineous] [10:Serous] [11:Serosanguineous] Exudate Color: [1:red, brown] [10:amber] [11:red, brown] Wound Margin: [1:Distinct, outline attached] [10:Distinct, outline attached] [11:Distinct, outline attached] Granulation Amount: [1:Large (67-100%)] [10:Large (67-100%)] [11:Large (67-100%)] Granulation Quality: [1:Pink, Pale] [10:Pink] [11:Red] Necrotic Amount: [1:None Present (0%)] [10:None Present (0%)] [11:None Present (0%)] Exposed Structures: [1:Fat Layer (Subcutaneous Tissue) Exposed: Yes Fascia: No Tendon: No Muscle: No Joint: No Bone: No] [10:Fat Layer (Subcutaneous Tissue) Exposed: Yes Fascia: No Tendon: No Muscle: No Joint: No Bone: No] [11:Fat Layer (Subcutaneous Tissue) Exposed: Yes  Fascia: No Tendon: No Muscle: No Joint: No Bone: No] Epithelialization: [1:Small (1-33%)] [10:None 3] [11:Small (1-33%) 4] Photos: [1:No Photos] [10:No Photos] [11:No Photos] Wound Location: [1:Right Toe - Web between 3rd and 4th] [10:Right Toe - Web between 4th and 5th] [11:Right Ankle - Posterior] Wounding Event: [1:Blister] [10:Blister] [11:Blister] Primary Etiology: [1:Lymphedema] [10:Lymphedema] [11:Lymphedema] Comorbid History: [1:Chronic sinus problems/congestion, Lymphedema, Hypertension, Rheumatoid Arthritis, Neuropathy]  [10:Chronic sinus problems/congestion, Lymphedema, Hypertension, Rheumatoid Arthritis, Neuropathy] [11:Chronic sinus problems/congestion,  Lymphedema, Hypertension, Rheumatoid Arthritis, Neuropathy] Date Acquired: [1:07/23/2019] [10:07/23/2019] [11:07/23/2019] Weeks of Treatment: [1:1] [10:1] [11:1] Wound Status: [1:Open] [10:Open] [11:Open] Measurements L x W x D 2.5x1x0.1 [10:2.5x1x0.1] [11:0.1x1.2x0.1] (cm) Area (cm) : [1:1.963] [10:1.963] [11:0.094] Volume (cm) : [1:0.196] [10:0.196] [11:0.009] % Reduction in Area: [1:44.50%] [10:-25.00%] [11:7.80%] % Reduction in Volume: 44.50% [10:-24.80%] [11:10.00%] Classification: [1:Partial Thickness] [10:Partial Thickness] [11:Full Thickness Without Exposed Support Structures] Exudate Amount: [1:Medium] [10:Medium] [11:Medium] Exudate Type: [1:Serosanguineous] [10:Serosanguineous] [11:Serosanguineous] Exudate Color: [1:red, brown] [10:red, brown] [11:red, brown] Wound Margin: [1:Distinct, outline attached] [10:Distinct, outline attached] [11:Distinct, outline attached] Granulation Amount: [1:Large (67-100%)] [10:Large (67-100%)] [11:Medium (34-66%)] Granulation Quality: [1:Red] [10:Red] [11:Pink] Necrotic Amount: [1:None Present (0%)] [10:None Present (0%)] [11:Medium (34-66%)] Exposed Structures: [1:Fat Layer (Subcutaneous Tissue) Exposed: Yes Fascia: No Tendon: No Muscle: No Joint: No Bone: No] [10:Fat Layer (Subcutaneous Tissue) Exposed: Yes Fascia: No Tendon: No Muscle: No Joint: No Bone: No] [11:Fat Layer (Subcutaneous Tissue) Exposed: Yes  Fascia: No Tendon: No Muscle: No Joint: No Bone: No] Epithelialization: [1:Small (1-33%) 6] [10:Small (1-33%) 7] [11:Small (1-33%) 8] Photos: [1:No Photos] [10:No Photos] [11:No Photos] Wound Location: [1:Left Toe - Web between 1st and 2nd] [10:Left Toe - Web between 2nd Left Toe - Web between 3rd and 3rd] [11:and 4th] Wounding Event: [1:Blister] [10:Blister] [11:Blister] Primary Etiology: [1:Lymphedema]  [  10:Lymphedema] [11:Lymphedema] Comorbid History: [1:Chronic sinus problems/congestion, Lymphedema, Hypertension, Rheumatoid Arthritis, Neuropathy] [10:Chronic sinus problems/congestion, Lymphedema, Hypertension, Rheumatoid Arthritis, Neuropathy] [11:Chronic sinus problems/congestion,  Lymphedema, Hypertension, Rheumatoid Arthritis, Neuropathy] Date Acquired: [1:07/23/2019] [10:07/23/2019] [11:07/23/2019] Weeks of Treatment: [1:1] [10:1] [11:1] Wound Status: [1:Open] [10:Open] [11:Open] Measurements L x W x D 2.5x1x0.1 [10:2x1x0.1] [11:1.3x1x0.1] (cm) Area (cm) : [1:1.963] [10:1.571] [11:1.021] Volume (cm) : [1:0.196] [10:0.157] [11:0.102] % Reduction in Area: [1:37.50%] [10:33.30%] [11:65.30%] % Reduction in Volume: 37.60% [10:33.50%] [11:65.40%] Classification: [1:Partial Thickness] [10:Partial Thickness] [11:Partial Thickness] Exudate Amount: [1:Medium] [10:Medium] [11:Medium] Exudate Type: [1:Serosanguineous] [10:Serosanguineous] [11:Serosanguineous] Exudate Color: [1:red, brown] [10:red, brown] [11:red, brown] Wound Margin: [1:Distinct, outline attached] [10:Distinct, outline attached] [11:Distinct, outline attached] Granulation Amount: [1:Large (67-100%)] [10:Large (67-100%)] [11:Large (67-100%)] Granulation Quality: [1:Red] [10:Red] [11:Red] Necrotic Amount: [1:None Present (0%)] [10:None Present (0%)] [11:None Present (0%)] Exposed Structures: [1:Fat Layer (Subcutaneous Tissue) Exposed: Yes Fascia: No Tendon: No Muscle: No Joint: No Bone: No] [10:Fat Layer (Subcutaneous Tissue) Exposed: Yes Fascia: No Tendon: No Muscle: No Joint: No Bone: No] [11:Fat Layer (Subcutaneous Tissue) Exposed: Yes  Fascia: No Tendon: No Muscle: No Joint: No Bone: No] Epithelialization: [1:Small (1-33%)] [10:Small (1-33%) 9 N/A] [11:Small (1-33%) N/A] Photos: [1:No Photos] [10:N/A] [11:N/A] Wound Location: [1:Left Hand - Web Between 4th and 5th Digit] [10:N/A] [11:N/A] Wounding Event: [1:Blister] [10:N/A]  [11:N/A] Primary Etiology: [1:Lymphedema] [10:N/A] [11:N/A] Comorbid History: [1:Chronic sinus problems/congestion, Lymphedema, Hypertension, Rheumatoid Arthritis, Neuropathy] [10:N/A] [11:N/A] Date Acquired: [1:07/23/2019] [10:N/A] [11:N/A] Weeks of Treatment: [1:1] [10:N/A] [11:N/A] Wound Status: [1:Open] [10:N/A] [11:N/A] Measurements L x W x D 1x1x0.1 [10:N/A] [11:N/A] (cm) Area (cm) : [1:0.785] [10:N/A] [11:N/A] Volume (cm) : [1:0.079] [10:N/A] [11:N/A] % Reduction in Area: [1:80.00%] [10:N/A] [11:N/A] % Reduction in Volume: 79.90% [10:N/A] [11:N/A] Classification: [1:Partial Thickness] [10:N/A] [11:N/A] Exudate Amount: [1:Medium] [10:N/A] [11:N/A] Exudate Type: [1:Serosanguineous] [10:N/A] [11:N/A] Exudate Color: [1:red, brown] [10:N/A] [11:N/A] Wound Margin: [1:Distinct, outline attached] [10:N/A] [11:N/A] Granulation Amount: [1:Large (67-100%)] [10:N/A] [11:N/A] Granulation Quality: [1:Red] [10:N/A] [11:N/A] Necrotic Amount: [1:None Present (0%)] [10:N/A] [11:N/A] Exposed Structures: [1:Fat Layer (Subcutaneous Tissue) Exposed: Yes Fascia: No Tendon: No Muscle: No Joint: No Bone: No Small (1-33%)] [10:N/A N/A] [11:N/A N/A] Treatment Notes Electronic Signature(s) Signed: 08/10/2019 6:32:44 PM By: Linton Ham MD Entered By: Linton Ham on 08/10/2019 16:20:16 -------------------------------------------------------------------------------- Multi-Disciplinary Care Plan Details Patient Name: Date of Service: [name] 08/10/2019 2:00 PM Medical Record [id number] Patient Account Number: [id number] Date of Birth/Sex: Treating RN: 06-06-1951 (68 y.o. Female) Carlene Coria Primary Care Enio Hornback: [name] Other Clinician: Referring Madhav Mohon: Treating Adi Doro/Extender:Robson, [name], [name] in Treatment: 1 Active Inactive Wound/Skin Impairment Nursing Diagnoses: Knowledge deficit related to ulceration/compromised skin  integrity Goals: Patient/caregiver will verbalize understanding of skin care regimen Date Initiated: 08/03/2019 Target Resolution Date: 09/03/2019 Goal Status: Active Ulcer/skin breakdown will have a volume reduction of 30% by week 4 Date Initiated: 08/03/2019 Target Resolution Date: 09/03/2019 Goal Status: Active Interventions: Assess patient/caregiver ability to obtain necessary supplies Assess patient/caregiver ability to perform ulcer/skin care regimen upon admission and as needed Assess ulceration(s) every visit Notes: Electronic Signature(s) Signed: 08/11/2019 11:01:46 AM By: Carlene Coria RN Entered By: Carlene Coria on 08/10/2019 14:18:26 -------------------------------------------------------------------------------- Pain Assessment Details Patient Name: Date of Service: MASSA, KLOOS 08/10/2019 2:00 PM Medical Record [id number] Patient Account Number: [id number] Date of Birth/Sex: Treating RN: 1951-07-04 (68 y.o. Female) Kela Millin Primary Care Larisa Lanius: [name] Other Clinician: Referring Malesha Suliman: Treating Shiquita Collignon/Extender:Robson, [name], [name] in Treatment: 1 Active Problems Location of Pain Severity and Description of Pain Patient Has Paino Yes Site  Locations Pain Location: Generalized Pain, Pain in Ulcers With Dressing Change: Yes Duration of the Pain. Constant / Intermittento Constant Rate the pain. Current Pain Level: 10 Worst Pain Level: 10 Least Pain Level: 7 Tolerable Pain Level: 5 Character of Pain Describe the Pain: Aching, Burning, Stabbing, Throbbing Pain Management and Medication Current Pain Management: Medication: Yes How does your wound impact your activities of daily livingo Sleep: Yes Electronic Signature(s) Signed: 08/11/2019 4:54:21 PM By: Kela Millin Entered By: Kela Millin on 08/10/2019  14:29:24 -------------------------------------------------------------------------------- Patient/Caregiver Education Details Patient Name: [name] 12/22/2020andnbsp2:00 Date of Service: PM Medical Record [id number] Number: Patient Account Number: [id number] Treating RN: Date of Birth/Gender: 04-01-1951 (68 y.o. Carlene Coria Female) Other Clinician: Primary Care Treating Primary Care Treating Joanne Gavel Physician: Physician/Extender: Referring Physician: Vito Berger in Treatment: 1 Education Assessment Education Provided To: Patient Education Topics Provided Wound/Skin Impairment: Methods: Explain/Verbal Responses: State content correctly Electronic Signature(s) Signed: 08/11/2019 11:01:46 AM By: Carlene Coria RN Entered By: Carlene Coria on 08/10/2019 14:18:41 -------------------------------------------------------------------------------- Wound Assessment Details Patient Name: Date of Service: AYRIANA, KLADIS. 08/10/2019 2:00 PM Medical Record [id number] Patient Account Number: [id number] Date of Birth/Sex: Treating RN: 18-Sep-1950 (68 y.o. Female) Dwiggins, Larene Beach Primary Care Trissa Molina: [name] Other Clinician: Referring Lynk Marti: Treating Ryin Schillo/Extender:Robson, [name], [name] in Treatment: 1 Wound Status Wound Number: 1 Primary Lymphedema Etiology: Wound Location: Right Toe - Web between 1st and 2nd Wound Open Status: Wounding Event: Blister Comorbid Chronic sinus problems/congestion, Date Acquired: 07/23/2019 History: Lymphedema, Hypertension, Rheumatoid Weeks Of Treatment: 1 Arthritis, Neuropathy Clustered Wound: No Photos Wound Measurements Length: (cm) 4.5 % Reductio Width: (cm) 1 % Reductio Depth: (cm) 0.1 Epithelial Area: (cm) 3.534 Tunneling Volume: (cm) 0.353 Undermini Wound Description Classification: Partial Thickness Wound Margin: Distinct, outline  attached Exudate Amount: Medium Exudate Type: Serosanguineous Exudate Color: red, brown Wound Bed Granulation Amount: Large (67-100%) Granulation Quality: Red Necrotic Amount: None Present (0%) Foul Odor After Cleansing: No Slough/Fibrino No Exposed Structure Fascia Exposed: No Fat Layer (Subcutaneous Tissue) Exposed: Yes Tendon Exposed: No Muscle Exposed: No Joint Exposed: No Bone Exposed: No n in Area: 26.5% n in Volume: 26.6% ization: Small (1-33%) : No ng: No Electronic Signature(s) Signed: 08/16/2019 4:06:01 PM By: Mikeal Hawthorne EMT/HBOT Signed: 08/16/2019 5:46:04 PM By: Kela Millin Previous Signature: 08/11/2019 4:54:21 PM Version By: Kela Millin Entered By: Mikeal Hawthorne on 08/16/2019 13:41:17 -------------------------------------------------------------------------------- Wound Assessment Details Patient Name: Date of Service: [name] 08/10/2019 2:00 PM Medical Record [id number] Patient Account Number: [id number] Date of Birth/Sex: Treating RN: 07-Jun-1951 (67 y.o. Female) Dwiggins, Larene Beach Primary Care Raevon Broom: [name] Other Clinician: Referring Allura Doepke: Treating Benjamine Strout/Extender:Robson, [name], [name] in Treatment: 1 Wound Status Wound Number: 10 Primary Lymphedema Etiology: Wound Location: Left Toe Great - Medial Wound Open Wounding Event: Blister Status: Date Acquired: 07/23/2019 Comorbid Chronic sinus problems/congestion, Weeks Of Treatment: 1 History: Lymphedema, Hypertension, Rheumatoid Clustered Wound: No Arthritis, Neuropathy Photos Wound Measurements Length: (cm) 0.4 % Reduct Width: (cm) 0.5 % Reduct Depth: (cm) 0.1 Epitheli Area: (cm) 0.157 Tunneli Volume: (cm) 0.016 Undermi Wound Description Classification: Full Thickness Without Exposed Support Foul Odo Structures Slough/F Wound Distinct, outline attached Margin: Exudate Medium Amount: Exudate  Serosanguineous Type: Exudate red, brown Color: Wound Bed Granulation Amount: Large (67-100%) Granulation Quality: Pink, Pale, Hyper-granulation Fascia E Necrotic Amount: Small (1-33%) Fat Laye Necrotic Quality: Adherent Slough Tendon E Muscle E Joint Ex Bone Exp r After Cleansing: No ibrino Yes Exposed Structure xposed: No r (Subcutaneous Tissue) Exposed: Yes  xposed: No xposed: No posed: No osed: No ion in Area: 60.1% ion in Volume: 59% alization: Small (1-33%) ng: No ning: No Electronic Signature(s) Signed: 08/16/2019 4:06:01 PM By: Mikeal Hawthorne EMT/HBOT Signed: 08/16/2019 5:46:04 PM By: Kela Millin Previous Signature: 08/11/2019 4:54:21 PM Version By: Kela Millin Entered By: Mikeal Hawthorne on 08/16/2019 13:55:51 -------------------------------------------------------------------------------- Wound Assessment Details Patient Name: Date of Service: [name] 08/10/2019 2:00 PM Medical Record [id number] Patient Account Number: [id number] Date of Birth/Sex: Treating RN: 08-20-1950 (68 y.o. Female) Dwiggins, Larene Beach Primary Care Felma Pfefferle: [name] Other Clinician: Referring Grover Robinson: Treating Samiha Denapoli/Extender:Robson, [name], [name] in Treatment: 1 Wound Status Wound Number: 11 Primary Lymphedema Etiology: Wound Location: Left Toe Second - Anterior Wound Open Wounding Event: Blister Status: Date Acquired: 07/23/2019 Comorbid Chronic sinus problems/congestion, Weeks Of Treatment: 1 History: Lymphedema, Hypertension, Rheumatoid Clustered Wound: No Arthritis, Neuropathy Photos Wound Measurements Length: (cm) 0.7 % Reduct Width: (cm) 0.7 % Reduct Depth: (cm) 0.1 Epitheli Area: (cm) 0.385 Tunneli Volume: (cm) 0.038 Undermi Wound Description Classification: Full Thickness Without Exposed Support Foul Odo Structures Slough/F Wound Distinct, outline attached Margin: Exudate  Medium Amount: Exudate Serosanguineous Type: Exudate red, brown Color: Wound Bed Granulation Amount: Small (1-33%) Granulation Quality: Red, Pink Fascia E Necrotic Amount: Large (67-100%) Fat Laye Necrotic Quality: Adherent Slough Tendon E Muscle E Joint Ex Bone Exp r After Cleansing: No ibrino Yes Exposed Structure xposed: No r (Subcutaneous Tissue) Exposed: Yes xposed: No xposed: No posed: No osed: No ion in Area: 12.5% ion in Volume: 13.6% alization: Small (1-33%) ng: No ning: No Electronic Signature(s) Signed: 08/16/2019 4:06:01 PM By: Mikeal Hawthorne EMT/HBOT Signed: 08/16/2019 5:46:04 PM By: Kela Millin Previous Signature: 08/11/2019 4:54:21 PM Version By: Kela Millin Entered By: Mikeal Hawthorne on 08/16/2019 13:54:35 -------------------------------------------------------------------------------- Wound Assessment Details Patient Name: Date of Service: [name] 08/10/2019 2:00 PM Medical Record [id number] Patient Account Number: [id number] Date of Birth/Sex: Treating RN: February 05, 1951 (68 y.o. Female) Dwiggins, Larene Beach Primary Care Ryane Canavan: [name] Other Clinician: Referring Mayli Covington: Treating Charleston Vierling/Extender:Robson, [name], [name] in Treatment: 1 Wound Status Wound Number: 12 Primary Lymphedema Etiology: Wound Location: Right Toe Fifth - Lateral Wound Healed - Epithelialized Wounding Event: Blister Status: Date Acquired: 07/23/2019 Comorbid Chronic sinus problems/congestion, Weeks Of Treatment: 1 History: Lymphedema, Hypertension, Rheumatoid Clustered Wound: No Arthritis, Neuropathy Photos Wound Measurements Length: (cm) 0 % Reduct Width: (cm) 0 % Reduct Depth: (cm) 0 Epitheli Area: (cm) 0 Tunneli Volume: (cm) 0 Undermi Wound Description Classification: Full Thickness Without Exposed Support Foul Odo Structures Slough/F Wound Distinct, outline attached Margin: Exudate  Medium Amount: Exudate Serosanguineous Type: Exudate red, brown Color: Wound Bed Granulation Amount: Large (67-100%) Granulation Quality: Pink, Pale Fascia E Necrotic Amount: None Present (0%) Fat Laye Tendon E Muscle E Joint Ex Bone Exp Electronic Signature(s) Signed: 08/16/2019 4:06:01 PM By: Mikeal Hawthorne EMT/HBOT Signed: 08/16/2019 5:46:04 PM By: Kela Millin Previous Signature: 08/11/2019 4:54:21 PM Version By: Michel Harrow Entered ByMikeal Hawthorne on 12/28 r After Cleansing: No ibrino No Exposed Structure xposed: No r (Subcutaneous Tissue) Exposed: Yes xposed: No xposed: No posed: No osed: No hannon /2020 13:36:37 ion in Area: 100% ion in Volume: 100% alization: Small (1-33%) ng: No ning: No -------------------------------------------------------------------------------- Wound Assessment Details Patient Name: Date of Service: KERISA, CARNATHAN 08/10/2019 2:00 PM Medical Record [id number] Patient Account Number: [id number] Date of Birth/Sex: Treating RN: 02-28-1951 (68 y.o. Female) Kela Millin Primary Care Deeanna Beightol: [name] Other Clinician: Referring Motty Borin: Treating Janiyah Beery/Extender:Robson, [name], [name] in Treatment: 1  Wound Status Wound Number: 13 Primary Lymphedema Etiology: Wound Location: Right Toe Third Wound Open Wounding Event: Gradually Appeared Status: Date Acquired: 08/10/2019 Comorbid Chronic sinus problems/congestion, Weeks Of Treatment: 0 History: Lymphedema, Hypertension, Rheumatoid Clustered Wound: No Arthritis, Neuropathy Photos Wound Measurements Length: (cm) 0.3 % Reduct Width: (cm) 0.7 % Reduct Depth: (cm) 0.1 Epitheli Area: (cm) 0.165 Tunneli Volume: (cm) 0.016 Undermi Wound Description Full Thickness Without Exposed Support Foul Odo Classification: Structures Slough/F Wound Distinct, outline attached Margin: Exudate Small Amount: Exudate Serous Type: Exudate  amber Color: Wound Bed Granulation Amount: Large (67-100%) Granulation Quality: Pink Fascia Exp Necrotic Amount: None Present (0%) Fat Layer Tendon Exp Muscle Exp Joint Expo Bone Expos r After Cleansing: No ibrino No Exposed Structure osed: No (Subcutaneous Tissue) Exposed: Yes osed: No osed: No sed: No ed: No ion in Area: 0% ion in Volume: 0% alization: None ng: No ning: No Electronic Signature(s) Signed: 08/16/2019 4:06:01 PM By: Mikeal Hawthorne EMT/HBOT Signed: 08/16/2019 5:46:04 PM By: Kela Millin Previous Signature: 08/11/2019 4:54:21 PM Version By: Kela Millin Entered By: Mikeal Hawthorne on 08/16/2019 13:35:43 -------------------------------------------------------------------------------- Wound Assessment Details Patient Name: Date of Service: [name] 08/10/2019 2:00 PM Medical Record [id number] Patient Account Number: [id number] Date of Birth/Sex: Treating RN: 1951/02/17 (68 y.o. Female) Dwiggins, Larene Beach Primary Care Nickolai Rinks: [name] Other Clinician: Referring Jayshon Dommer: Treating Kania Regnier/Extender:Robson, [name], [name] in Treatment: 1 Wound Status Wound Number: 2 Primary Lymphedema Etiology: Wound Location: Right Toe - Web between 2nd and 3rd Wound Open Status: Wounding Event: Blister Comorbid Chronic sinus problems/congestion, Date Acquired: 07/23/2019 History: Lymphedema, Hypertension, Rheumatoid Weeks Of Treatment: 1 Arthritis, Neuropathy Clustered Wound: No Photos Wound Measurements Length: (cm) 3.5 % Reductio Width: (cm) 1.3 % Reductio Depth: (cm) 0.1 Epithelial Area: (cm) 3.574 Tunneling Volume: (cm) 0.357 Undermini Wound Description Classification: Partial Thickness Wound Margin: Distinct, outline attached Exudate Amount: Medium Exudate Type: Serosanguineous Exudate Color: red, brown Wound Bed Granulation Amount: Large (67-100%) Granulation Quality: Red Necrotic Amount: None  Present (0%) Foul Odor After Cleansing: No Slough/Fibrino No Exposed Structure Fascia Exposed: No Fat Layer (Subcutaneous Tissue) Exposed: Yes Tendon Exposed: No Muscle Exposed: No Joint Exposed: No Bone Exposed: No n in Area: 10.8% n in Volume: 11% ization: Small (1-33%) : No ng: No Electronic Signature(s) Signed: 08/16/2019 4:06:01 PM By: Mikeal Hawthorne EMT/HBOT Signed: 08/16/2019 5:46:04 PM By: Kela Millin Previous Signature: 08/11/2019 4:54:21 PM Version By: Kela Millin Entered By: Mikeal Hawthorne on 08/16/2019 13:40:52 -------------------------------------------------------------------------------- Wound Assessment Details Patient Name: Date of Service: [name] 08/10/2019 2:00 PM Medical Record [id number] Patient Account Number: [id number] Date of Birth/Sex: Treating RN: Apr 26, 1951 (68 y.o. Female) Dwiggins, Larene Beach Primary Care Kacia Halley: [name] Other Clinician: Referring Yeraldi Fidler: Treating Cosmo Tetreault/Extender:Robson, [name], [name] in Treatment: 1 Wound Status Wound Number: 3 Primary Lymphedema Etiology: Wound Location: Right Toe - Web between 3rd and 4th Wound Open Status: Wounding Event: Blister Comorbid Chronic sinus problems/congestion, Date Acquired: 07/23/2019 History: Lymphedema, Hypertension, Rheumatoid Weeks Of Treatment: 1 Arthritis, Neuropathy Clustered Wound: No Photos Wound Measurements Length: (cm) 2.5 % Reductio Width: (cm) 1 % Reductio Depth: (cm) 0.1 Epithelial Area: (cm) 1.963 Tunneling Volume: (cm) 0.196 Undermini Wound Description Classification: Partial Thickness Wound Margin: Distinct, outline attached Exudate Amount: Medium Exudate Type: Serosanguineous Exudate Color: red, brown Wound Bed Granulation Amount: Large (67-100%) Granulation Quality: Red Necrotic Amount: None Present (0%) Foul Odor After Cleansing: No Slough/Fibrino No Exposed Structure Fascia Exposed:  No Fat Layer (Subcutaneous Tissue) Exposed: Yes Tendon Exposed: No Muscle Exposed: No Joint  Exposed: No Bone Exposed: No n in Area: 44.5% n in Volume: 44.5% ization: Small (1-33%) : No ng: No Electronic Signature(s) Signed: 08/16/2019 4:06:01 PM By: Mikeal Hawthorne EMT/HBOT Signed: 08/16/2019 5:46:04 PM By: Kela Millin Previous Signature: 08/11/2019 4:54:21 PM Version By: Kela Millin Entered By: Mikeal Hawthorne on 08/16/2019 13:37:30 -------------------------------------------------------------------------------- Wound Assessment Details Patient Name: Date of Service: [name] 08/10/2019 2:00 PM Medical Record [id number] Patient Account Number: [id number] Date of Birth/Sex: Treating RN: 1951/08/01 (68 y.o. Female) Dwiggins, Larene Beach Primary Care Amariss Detamore: [name] Other Clinician: Referring Ahlijah Raia: Treating Grigor Lipschutz/Extender:Robson, [name], [name] in Treatment: 1 Wound Status Wound Number: 4 Primary Lymphedema Etiology: Wound Location: Right Toe - Web between 4th and 5th Wound Open Status: Wounding Event: Blister Comorbid Chronic sinus problems/congestion, Date Acquired: 07/23/2019 History: Lymphedema, Hypertension, Rheumatoid Weeks Of Treatment: 1 Arthritis, Neuropathy Clustered Wound: No Photos Wound Measurements Length: (cm) 2.5 % Reduct Width: (cm) 1 % Reduct Depth: (cm) 0.1 Epitheli Area: (cm) 1.963 Tunneli Volume: (cm) 0.196 Undermi Wound Description Classification: Partial Thickness Wound Margin: Distinct, outline attached Exudate Amount: Medium Exudate Type: Serosanguineous Exudate Color: red, brown Wound Bed Granulation Amount: Large (67-100%) Granulation Quality: Red Necrotic Amount: None Present (0%) Foul Odor After Cleansing: No Slough/Fibrino No Exposed Structure Fascia Exposed: No Fat Layer (Subcutaneous Tissue) Exposed: Yes Tendon Exposed: No Muscle Exposed: No Joint Exposed: No Bone  Exposed: No ion in Area: -25% ion in Volume: -24.8% alization: Small (1-33%) ng: No ning: No Electronic Signature(s) Signed: 08/16/2019 4:06:01 PM By: Mikeal Hawthorne EMT/HBOT Signed: 08/16/2019 5:46:04 PM By: Kela Millin Previous Signature: 08/11/2019 4:54:21 PM Version By: Kela Millin Entered By: Mikeal Hawthorne on 08/16/2019 13:37:02 -------------------------------------------------------------------------------- Wound Assessment Details Patient Name: Date of Service: [name] 08/10/2019 2:00 PM Medical Record [id number] Patient Account Number: [id number] Date of Birth/Sex: Treating RN: 04-27-1951 (68 y.o. Female) Dwiggins, Larene Beach Primary Care Yahsir Wickens: [name] Other Clinician: Referring Sahaj Bona: Treating Janaiah Vetrano/Extender:Robson, [name], [name] in Treatment: 1 Wound Status Wound Number: 5 Primary Lymphedema Etiology: Wound Location: Right Ankle - Posterior Wound Open Wounding Event: Blister Wounding Event: Blister Status: Date Acquired: 07/23/2019 Comorbid Chronic sinus problems/congestion, Weeks Of Treatment: 1 History: Lymphedema, Hypertension, Rheumatoid Clustered Wound: No Arthritis, Neuropathy Photos Wound Measurements Length: (cm) 0.1 % Reduct Width: (cm) 1.2 % Reduct Depth: (cm) 0.1 Epitheli Area: (cm) 0.094 Tunneli Volume: (cm) 0.009 Undermi Wound Description Classification: Full Thickness Without Exposed Support Foul Odo Structures Slough/F Wound Distinct, outline attached Margin: Exudate Medium Amount: Exudate Serosanguineous Type: Exudate red, brown Color: Wound Bed Granulation Amount: Medium (34-66%) Granulation Quality: Pink Fascia E Necrotic Amount: Medium (34-66%) Fat Laye Necrotic Quality: Adherent Slough Tendon E Muscle E Joint Ex Bone Exp r After Cleansing: No ibrino Yes Exposed Structure xposed: No r (Subcutaneous Tissue) Exposed: Yes xposed: No xposed: No posed:  No osed: No ion in Area: 7.8% ion in Volume: 10% alization: Small (1-33%) ng: No ning: No Electronic Signature(s) Signed: 08/16/2019 4:06:01 PM By: Mikeal Hawthorne EMT/HBOT Signed: 08/16/2019 5:46:04 PM By: Kela Millin Previous Signature: 08/11/2019 4:54:21 PM Version By: Kela Millin Entered By: Mikeal Hawthorne on 08/16/2019 13:36:10 -------------------------------------------------------------------------------- Wound Assessment Details Patient Name: Date of Service: [name] 08/10/2019 2:00 PM Medical Record [id number] Patient Account Number: [id number] Date of Birth/Sex: Treating RN: 1951-05-16 (68 y.o. Female) Kela Millin Primary Care Tamasha Laplante: [name] Other Clinician: Referring Karelyn Brisby: Treating Earnest Mcgillis/Extender:Robson, [name], [name] in Treatment: 1 Wound Status Wound Number: 6 Primary Lymphedema Etiology: Wound Location: Left Toe - Web between 1st  and 2nd Wound Open Status: Wounding Event: Blister Comorbid Chronic sinus problems/congestion, Date Acquired: 07/23/2019 History: Lymphedema, Hypertension, Rheumatoid Weeks Of Treatment: 1 Arthritis, Neuropathy Clustered Wound: No Photos Wound Measurements Length: (cm) 2.5 % Reductio Width: (cm) 1 % Reductio Depth: (cm) 0.1 Epithelial Area: (cm) 1.963 Tunneling Volume: (cm) 0.196 Undermini Wound Description Classification: Partial Thickness Wound Margin: Distinct, outline attached Exudate Amount: Medium Exudate Type: Serosanguineous Exudate Color: red, brown Wound Bed Granulation Amount: Large (67-100%) Granulation Quality: Red Necrotic Amount: None Present (0%) Foul Odor After Cleansing: No Slough/Fibrino No Exposed Structure Fascia Exposed: No Fat Layer (Subcutaneous Tissue) Exposed: Yes Tendon Exposed: No Muscle Exposed: No Joint Exposed: No Bone Exposed: No n in Area: 37.5% n in Volume: 37.6% ization: Small (1-33%) : No ng:  No Electronic Signature(s) Signed: 08/16/2019 4:06:01 PM By: Mikeal Hawthorne EMT/HBOT Signed: 08/16/2019 5:46:04 PM By: Kela Millin Previous Signature: 08/11/2019 4:54:21 PM Version By: Kela Millin Entered By: Mikeal Hawthorne on 08/16/2019 13:55:27 -------------------------------------------------------------------------------- Wound Assessment Details Patient Name: Date of Service: [name] 08/10/2019 2:00 PM Medical Record [id number] Patient Account Number: [id number] Date of Birth/Sex: Treating RN: 19-Apr-1951 (68 y.o. Female) Dwiggins, Larene Beach Primary Care Lovett Coffin: [name] Other Clinician: Referring Thetis Schwimmer: Treating Daneisha Surges/Extender:Robson, [name], [name] in Treatment: 1 Wound Status Wound Number: 7 Primary Lymphedema Etiology: Wound Location: Left Toe - Web between 2nd and 3rd Wound Open Status: Wounding Event: Blister Comorbid Chronic sinus problems/congestion, Date Acquired: 07/23/2019 History: Lymphedema, Hypertension, Rheumatoid Weeks Of Treatment: 1 Arthritis, Neuropathy Clustered Wound: No Photos Wound Measurements Length: (cm) 2 % Reduction Width: (cm) 1 % Reduction Depth: (cm) 0.1 Epithelializ Area: (cm) 1.571 Tunneling: Volume: (cm) 0.157 Undermining Wound Description Classification: Partial Thickness Foul Odor Af Wound Margin: Distinct, outline attached Slough/Fibri Exudate Amount: Medium Exudate Type: Serosanguineous Exudate Color: red, brown Wound Bed Granulation Amount: Large (67-100%) Granulation Quality: Red Fascia Expos Necrotic Amount: None Present (0%) Fat Layer (S Tendon Expos Muscle Expos Joint Expose Bone Exposed ter Cleansing: No no No Exposed Structure ed: No ubcutaneous Tissue) Exposed: Yes ed: No ed: No d: No : No in Area: 33.3% in Volume: 33.5% ation: Small (1-33%) No : No Electronic Signature(s) Signed: 08/16/2019 4:06:01 PM By: Mikeal Hawthorne EMT/HBOT Signed:  08/16/2019 5:46:04 PM By: Kela Millin Previous Signature: 08/11/2019 4:54:21 PM Version By: Kela Millin Entered By: Mikeal Hawthorne on 08/16/2019 13:54:09 -------------------------------------------------------------------------------- Wound Assessment Details Patient Name: Date of Service: [name] 08/10/2019 2:00 PM Medical Record [id number] Patient Account Number: [id number] Date of Birth/Sex: Treating RN: [birth year] (68 y.o. Female) Dwiggins, Larene Beach Primary Care Aleaya Latona: [name] Other Clinician: Referring Jhamal Plucinski: Treating Iza Preston/Extender:Robson, [name], [name] in Treatment: 1 Wound Status Wound Number: 8 Primary Lymphedema Etiology: Wound Location: Left Toe - Web between 3rd and 4th Wound Open Status: Wounding Event: Blister Comorbid Chronic sinus problems/congestion, Date Acquired: 07/23/2019 History: Lymphedema, Hypertension, Rheumatoid Weeks Of Treatment: 1 Arthritis, Neuropathy Clustered Wound: No Photos Wound Measurements Length: (cm) 1.3 % Reductio Width: (cm) 1 % Reductio Depth: (cm) 0.1 Epithelial Area: (cm) 1.021 Tunneling Volume: (cm) 0.102 Undermini Wound Description Classification: Partial Thickness Wound Margin: Distinct, outline attached Exudate Amount: Medium Exudate Type: Serosanguineous Exudate Color: red, brown Wound Bed Granulation Amount: Large (67-100%) Granulation Quality: Red Necrotic Amount: None Present (0%) Foul Odor After Cleansing: No Slough/Fibrino No Exposed Structure Fascia Exposed: No Fat Layer (Subcutaneous Tissue) Exposed: Yes Tendon Exposed: No Muscle Exposed: No Joint Exposed: No Bone Exposed: No n in Area: 65.3% n in Volume: 65.4% ization: Small (1-33%) :  No ng: No Electronic Signature(s) Signed: 08/16/2019 4:06:01 PM By: Mikeal Hawthorne EMT/HBOT Signed: 08/16/2019 5:46:04 PM By: Kela Millin Previous Signature: 08/11/2019 4:54:21 PM Version By:  Kela Millin Entered By: Mikeal Hawthorne on 08/16/2019 13:51:26 -------------------------------------------------------------------------------- Wound Assessment Details Patient Name: Date of Service: [name] 08/10/2019 2:00 PM Medical Record [id number] Patient Account Number: [id number] Date of Birth/Sex: Treating RN: [birth year] (68 y.o. Female) Dwiggins, Larene Beach Primary Care Jahmya Onofrio: [name] Other Clinician: Referring Dyann Goodspeed: Treating Norman Piacentini/Extender:Robson, [name], [name] in Treatment: 1 Wound Status Wound Number: 9 Primary Lymphedema Etiology: Wound Location: Left Toe - Web between 4th and 5th Wound Open Status: Wounding Event: Blister Comorbid Chronic sinus problems/congestion, Date Acquired: 07/23/2019 History: Lymphedema, Hypertension, Rheumatoid Weeks Of Treatment: 1 Arthritis, Neuropathy Clustered Wound: No Photos Wound Measurements Length: (cm) 1 % Reductio Width: (cm) 1 % Reductio Depth: (cm) 0.1 Epithelial Area: (cm) 0.785 Tunneling Volume: (cm) 0.079 Undermini Wound Description Classification: Partial Thickness Wound Margin: Distinct, outline attached Exudate Amount: Medium Exudate Type: Serosanguineous Exudate Color: red, brown Wound Bed Granulation Amount: Large (67-100%) Granulation Quality: Red Necrotic Amount: None Present (0%) Foul Odor After Cleansing: No Slough/Fibrino No Exposed Structure Fascia Exposed: No Fat Layer (Subcutaneous Tissue) Exposed: Yes Tendon Exposed: No Muscle Exposed: No Joint Exposed: No Bone Exposed: No n in Area: 80% n in Volume: 79.9% ization: Small (1-33%) : No ng: No Electronic Signature(s) Signed: 08/16/2019 4:06:01 PM By: Mikeal Hawthorne EMT/HBOT Signed: 08/16/2019 5:46:04 PM By: Kela Millin Previous Signature: 08/11/2019 4:54:21 PM Version By: Kela Millin Entered By: Mikeal Hawthorne on 08/16/2019  13:50:59 -------------------------------------------------------------------------------- Vitals Details Patient Name: Date of Service: [name] 08/10/2019 2:00 PM Medical Record [id number] Patient Account Number: [id number] Date of Birth/Sex: Treating RN: 08/01/1951 (68 y.o. Female) Kela Millin Primary Care Roy Tokarz: [name] Other Clinician: Referring Dallan Schonberg: Treating Lachae Hohler/Extender:Robson, [name], [name] in Treatment: 1 Vital Signs Time Taken: 14:25 Temperature (F): 98.4 Height (in): 66 Pulse (bpm): 78 Weight (lbs): 260 Respiratory Rate (breaths/min): 19 Body Mass Index (BMI): 42 Blood Pressure (mmHg): 189/79 Reference Range: 80 - 120 mg / dl Electronic Signature(s) Signed: 08/11/2019 4:54:21 PM By: Kela Millin Entered By: Kela Millin on 08/10/2019 14:28:40

## 2019-08-11 NOTE — Progress Notes (Signed)
Christine, [name] ([id number]) Visit Report for 08/10/2019 HPI Details Patient Name: Date of Service: Christine, [name]. 08/10/2019 2:00 PM Medical Record [id number] Patient Account Number: [id number] Date of Birth/Sex: Treating RN: Apr 23, 1951 (68 y.o. F) Primary Care Provider: [name] Other Clinician: Referring Provider: Treating Provider/Extender:Jacqueline Delapena, [name], [name] in Treatment: 1 History of Present Illness HPI Description: Admission 08/03/2019 This is a 68 year old woman who tells me that somewhere just over 2 years ago she developed shingles on her lower back. Ever since then she has had symmetric bilateral lower extremity redness swelling and pain involving predominantly her feet but extending up her calfs to just below the knees. She says the pain and burning is severe and she relieves this by immersing her feet in cold water up to 7 times a day. She does not get any relief by any other mechanism. She also has lymphedema and unfortunately cannot tolerate compression hose. She has been to the lymphedema clinic in [place] on 1 occasion. She does however have compression pumps at home that [place] but she does not use these. Recently she has had primary doctor appointments for redness of the ankles with blisters. She received a course of doxycycline with some effect. She is felt to have erythromelag and is recently been started on an adult strength aspirin 325. She also received a course of Bactrim.ia The patient has had arterial studies done in 2018 at that point she had a ABI of 1.16 on the right and 1.188 on the left. TBI's of 0.79 on the right and 0.77 on the left. Her waveforms were triphasic. More recently on July 20, 2019 she had just ABIs done that showed a ABI in the right of 0.98 and on the left at 0.99. She did not have TBI's however her waveforms were monophasic. I am told that she went to see vascular surgery  at 1 point although I could not put my finger on the note. Past medical history; notable for rheumatoid arthritis on Plaquenil and sulfasalazine, lymphedema, neuropathy on gabapentin, lumbar stenosis status post surgery in August, primary hyperparathyroidism undergoing a left superior parathyroid orchidectomy earlier this year. 12/22; patient is a bit of an enigma. She has excruciating pain. Lymphedema, neuropathy, denuded skin between her toes which I wonder it might be tinea pedis. She has been previously diagnosed with erythromelalgia. She is on Neurontin. Gets relief by soaking her feet in ice water multiple times a day which I have tried to get her to stop doing. I have also had her use the compression pump she had at home to see if we can get some of the edema out of her legs. With regards to her toes we are separating the toes with silver alginate applying ketoconazole cream. I gave her 7 days of Diflucan although this interacts with Plaquenil. I am going to try to change her to terbinafine. She is using her compression pumps twice a day I think her pain may be some combination of the severe excoriation between her toes, peripheral neuropathy. Electronic Signature(s) Signed: 08/10/2019 6:32:44 PM By: Linton Ham MD Entered By: Linton Ham on 08/10/2019 16:26:03 -------------------------------------------------------------------------------- Physical Exam Details Patient Name: Date of Service: [name]. 08/10/2019 2:00 PM Medical Record [id number] Patient Account Number: [id number] Date of Birth/Sex: Treating RN: [birth year] (68 y.o. F) Primary Care Provider: [name] Other Clinician: Referring Provider: Treating Provider/Extender:Keeara Frees, [name], [name] in Treatment: 1 Constitutional Patient is hypertensive.. Pulse regular and within target range for  patient.Marland Kitchen Respirations regular, non-labored and within target range.. Temperature  is normal and within the target range for the patient.Marland Kitchen Appears in no distress. Cardiovascular Pedal pulses are palpable. Edema present in both extremities. Less edema in the left leg there is also some less erythema. Right looks about the same to me.. Integumentary (Hair, Skin) Bilateral erythema right greater than left leg. Psychiatric appears at normal baseline. Notes Wound exam; the patient's anterior tibia area right greater than left are erythematous. I thought this was probably stasis dermatitis/interstitial edema. She has probable lymphedema as well. Between the toes marked excoriation in the webspaces bilaterally. Skin is totally denuded in all the webspaces. The left fourth and fifth look better but the rest of them about the same. Electronic Signature(s) Signed: 08/10/2019 6:32:44 PM By: Linton Ham MD Entered By: Linton Ham on 08/10/2019 16:27:38 -------------------------------------------------------------------------------- Physician Orders Details Patient Name: Date of Service: [name]. 08/10/2019 2:00 PM Medical Record [id number] Patient Account Number: [id number] Date of Birth/Sex: Treating RN: 02/24/1951 (68 y.o. 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 Dressing Change Frequency Wound #1 Right Toe - Web between 1st and 2nd Change dressing every day. Wound #10 Left,Medial Toe Great Change dressing every day. Wound #11 Left,Anterior  Toe Second Change dressing every day. Wound #2 Right Toe - Web between 2nd and 3rd Change dressing every day. Wound #3 Right Toe - Web between 3rd and 4th Change dressing every day. Wound #4 Right Toe - Web between 4th and 5th Change dressing every day. Wound #5 Right,Posterior Ankle Change dressing every day. Wound #6 Left Toe - Web between 1st and 2nd Change dressing every day. Wound #7 Left Toe - Web between 2nd and 3rd Change dressing every day. Wound #8 Left Toe - Web between 3rd and 4th Change dressing every day. Wound #9 Left Toe - Web between 4th and 5th Change dressing every day. Skin Barriers/Peri-Wound Care Wound #1 Right Toe - Web between 1st and 2nd Barrier cream Wound #10 Left,Medial Toe Great Barrier cream Wound #11 Left,Anterior Toe Second Barrier cream Wound #2 Right Toe - Web between 2nd and 3rd Barrier cream Wound #3 Right Toe - Web between 3rd and 4th Barrier cream Wound #4 Right Toe - Web between 4th and 5th Barrier cream Wound #5 Right,Posterior Ankle Barrier cream Wound #6 Left Toe - Web between 1st and 2nd Barrier cream Wound #7 Left Toe - Web between 2nd and 3rd Barrier cream Wound #8 Left Toe - Web between 3rd and 4th Barrier cream Wound #9 Left Toe - Web between 4th and 5th Barrier cream Wound Cleansing Wound #1 Right Toe - Web between 1st and 2nd May shower and wash wound with soap and water. Wound #10 Left,Medial Toe Great May shower and wash wound with soap and water. Wound #11 Left,Anterior Toe Second May shower and wash wound with soap and water. Wound #2 Right Toe - Web between 2nd and 3rd May shower and wash wound with soap and water. Wound #3 Right Toe - Web between 3rd and 4th May shower and wash wound with soap and water.  Wound #4 Right Toe - Web between 4th and 5th May shower and wash wound with soap and water. Wound #5 Right,Posterior Ankle May shower and wash wound with soap and water. Wound #6 Left Toe - Web between 1st  and 2nd May shower and wash wound with soap and water. Wound #7 Left Toe - Web between 2nd and 3rd May shower and wash wound with soap and water. Wound #8 Left Toe - Web between 3rd and 4th May shower and wash wound with soap and water. Wound #9 Left Toe - Web between 4th and 5th May shower and wash wound with soap and water. Primary Wound Dressing Calcium Alginate with Silver Other: - ketoconazole Secondary Dressing Kerlix/Rolled Gauze Dry Gauze Electronic Signature(s) Signed: 08/10/2019 6:32:44 PM By: Linton Ham MD Signed: 08/11/2019 11:01:46 AM By: Carlene Coria RN Entered By: Carlene Coria on 08/10/2019 15:56:01 -------------------------------------------------------------------------------- Problem List Details Patient Name: Date of Service: [name]. 08/10/2019 2:00 PM Medical Record [id number] Patient Account Number: [id number] Date of Birth/Sex: Treating RN: March 18, 1951 (68 y.o. [name] Primary Care Provider: [name] Other Clinician: Referring Provider: Treating Provider/Extender:Sabrina Arriaga, [name], [name] in Treatment: 1 Active Problems ICD-10 Evaluated Encounter Code Description Active Date Today Diagnosis I89.0 Lymphedema, not elsewhere classified 08/03/2019 No Yes L97.511 Non-pressure chronic ulcer of other part of right foot 08/03/2019 No Yes limited to breakdown of skin L97.521 Non-pressure chronic ulcer of other part of left foot 08/03/2019 No Yes limited to breakdown of skin B35.3 Tinea pedis 08/03/2019 No Yes Inactive Problems Resolved Problems Electronic Signature(s) Signed: 08/10/2019 6:32:44 PM By: Linton Ham MD Entered By: Linton Ham on 08/10/2019 16:19:48 -------------------------------------------------------------------------------- Progress Note Details Patient Name: Date of Service: [name]. 08/10/2019 2:00 PM Medical Record [id number] Patient Account Number:  [id number] Date of Birth/Sex: Treating RN: [birth year] (68 y.o. F) Primary Care Provider: [name] Other Clinician: Referring Provider: Treating Provider/Extender:[name], [name], [name] in Treatment: 1 Subjective History of Present Illness (HPI) Admission 08/03/2019 This is a 68 year old woman who tells me that somewhere just over 2 years ago she developed shingles on her lower back. Ever since then she has had symmetric bilateral lower extremity redness swelling and pain involving predominantly her feet but extending up her calfs to just below the knees. She says the pain and burning is severe and she relieves this by immersing her feet in cold water up to 7 times a day. She does not get any relief by any other mechanism. She also has lymphedema and unfortunately cannot tolerate compression hose. She has been to the lymphedema clinic in [place] on 1 occasion. She does however have compression pumps at home that [place] but she does not use these. Recently she has had primary doctor appointments for redness of the ankles with blisters. She received a course of doxycycline with some effect. She is felt to have erythromelag and is recently been started on an adult strength aspirin 325. She also received a course of Bactrim.ia The patient has had arterial studies done in 2018 at that point she had a ABI of 1.16 on the right and 1.188 on the left. TBI's of 0.79 on the right and 0.77 on the left. Her waveforms were triphasic. More recently on July 20, 2019 she had just ABIs done that showed a ABI in the right of 0.98 and on the left at 0.99. She did not have TBI's however her waveforms were monophasic. I am told that she went to see  vascular surgery at 1 point although I could not put my finger on the note. Past medical history; notable for rheumatoid arthritis on Plaquenil and sulfasalazine, lymphedema, neuropathy on gabapentin, lumbar  stenosis status post surgery in August, primary hyperparathyroidism undergoing a left superior parathyroid orchidectomy earlier this year. 12/22; patient is a bit of an enigma. She has excruciating pain. Lymphedema, neuropathy, denuded skin between her toes which I wonder it might be tinea pedis. She has been previously diagnosed with erythromelalgia. She is on Neurontin. Gets relief by soaking her feet in ice water multiple times a day which I have tried to get her to stop doing. I have also had her use the compression pump she had at home to see if we can get some of the edema out of her legs. With regards to her toes we are separating the toes with silver alginate applying ketoconazole cream. I gave her 7 days of Diflucan although this interacts with Plaquenil. I am going to try to change her to terbinafine. She is using her compression pumps twice a day I think her pain may be some combination of the severe excoriation between her toes, peripheral neuropathy. Objective Constitutional Patient is hypertensive.. Pulse regular and within target range for patient.Marland Kitchen Respirations regular, non-labored and within target range.. Temperature is normal and within the target range for the patient.Marland Kitchen Appears in no distress. Vitals Time Taken: 2:25 PM, Height: 66 in, Weight: 260 lbs, BMI: 42, Temperature: 98.4 F, Pulse: 78 bpm, Respiratory Rate: 19 breaths/min, Blood Pressure: 189/79 mmHg. Cardiovascular Pedal pulses are palpable. Edema present in both extremities. Less edema in the left leg there is also some less erythema. Right looks about the same to me.Marland Kitchen Psychiatric appears at normal baseline. General Notes: Wound exam; the patient's anterior tibia area right greater than left are erythematous. I thought this was probably stasis dermatitis/interstitial edema. She has probable lymphedema as well. Between the toes marked excoriation in the webspaces bilaterally. Skin is totally denuded in all the  webspaces. The left fourth and fifth look better but the rest of them about the same. Integumentary (Hair, Skin) Bilateral erythema right greater than left leg. Wound #1 status is Open. Original cause of wound was Blister. The wound is located on the Right Toe - Web between 1st and 2nd. The wound measures 4.5cm length x 1cm width x 0.1cm depth; 3.534cm^2 area and 0.353cm^3 volume. There is Fat Layer (Subcutaneous Tissue) Exposed exposed. There is no tunneling or undermining noted. There is a medium amount of serosanguineous drainage noted. The wound margin is distinct with the outline attached to the wound base. There is large (67-100%) red granulation within the wound bed. There is no necrotic tissue within the wound bed. Wound #10 status is Open. Original cause of wound was Blister. The wound is located on the [employer]. The wound measures 0.4cm length x 0.5cm width x 0.1cm depth; 0.157cm^2 area and 0.016cm^3 volume. There is Fat Layer (Subcutaneous Tissue) Exposed exposed. There is no tunneling or undermining noted. There is a medium amount of serosanguineous drainage noted. The wound margin is distinct with the outline attached to the wound base. There is large (67-100%) pink, pale, hyper - granulation within the wound bed. There is a small (1-33%) amount of necrotic tissue within the wound bed including Adherent Slough. Wound #11 status is Open. Original cause of wound was Blister. The wound is located on the Left,Anterior Toe Second. The wound measures 0.7cm length x 0.7cm width x 0.1cm depth; 0.385cm^2  area and 0.038cm^3 volume. There is Fat Layer (Subcutaneous Tissue) Exposed exposed. There is no tunneling or undermining noted. There is a medium amount of serosanguineous drainage noted. The wound margin is distinct with the outline attached to the wound base. There is small (1-33%) red, pink granulation within the wound bed. There is a large (67-100%) amount of necrotic  tissue within the wound bed including Adherent Slough. Wound #12 status is Open. Original cause of wound was Blister. The wound is located on the Right,Lateral Toe Fifth. The wound measures 0cm length x 0cm width x 0cm depth; 0cm^2 area and 0cm^3 volume. There is Fat Layer (Subcutaneous Tissue) Exposed exposed. There is no tunneling or undermining noted. There is a medium amount of serosanguineous drainage noted. The wound margin is distinct with the outline attached to the wound base. There is large (67-100%) pink, pale granulation within the wound bed. There is no necrotic tissue within the wound bed. Wound #13 status is Open. Original cause of wound was Gradually Appeared. The wound is located on the Right Toe Third. The wound measures 0.3cm length x 0.7cm width x 0.1cm depth; 0.165cm^2 area and 0.016cm^3 volume. There is Fat Layer (Subcutaneous Tissue) Exposed exposed. There is no tunneling or undermining noted. There is a small amount of serous drainage noted. The wound margin is distinct with the outline attached to the wound base. There is large (67-100%) pink granulation within the wound bed. There is no necrotic tissue within the wound bed. Wound #2 status is Open. Original cause of wound was Blister. The wound is located on the Right Toe - Web between 2nd and 3rd. The wound measures 3.5cm length x 1.3cm width x 0.1cm depth; 3.574cm^2 area and 0.357cm^3 volume. There is Fat Layer (Subcutaneous Tissue) Exposed exposed. There is no tunneling or undermining noted. There is a medium amount of serosanguineous drainage noted. The wound margin is distinct with the outline attached to the wound base. There is large (67-100%) red granulation within the wound bed. There is no necrotic tissue within the wound bed. Wound #3 status is Open. Original cause of wound was Blister. The wound is located on the Right Toe - Web between 3rd and 4th. The wound measures 2.5cm length x 1cm width x 0.1cm depth;  1.963cm^2 area and 0.196cm^3 volume. There is Fat Layer (Subcutaneous Tissue) Exposed exposed. There is no tunneling or undermining noted. There is a medium amount of serosanguineous drainage noted. The wound margin is distinct with the outline attached to the wound base. There is large (67-100%) red granulation within the wound bed. There is no necrotic tissue within the wound bed. Wound #4 status is Open. Original cause of wound was Blister. The wound is located on the Right Toe - Web between 4th and 5th. The wound measures 2.5cm length x 1cm width x 0.1cm depth; 1.963cm^2 area and 0.196cm^3 volume. There is Fat Layer (Subcutaneous Tissue) Exposed exposed. There is no tunneling or undermining noted. There is a medium amount of serosanguineous drainage noted. The wound margin is distinct with the outline attached to the wound base. There is large (67-100%) red granulation within the wound bed. There is no necrotic tissue within the wound bed. Wound #5 status is Open. Original cause of wound was Blister. The wound is located on the Right,Posterior Ankle. The wound measures 0.1cm length x 1.2cm width x 0.1cm depth; 0.094cm^2 area and 0.009cm^3 volume. There is Fat Layer (Subcutaneous Tissue) Exposed exposed. There is no tunneling or undermining noted. There is a  medium amount of serosanguineous drainage noted. The wound margin is distinct with the outline attached to the wound base. There is medium (34-66%) pink granulation within the wound bed. There is a medium (34-66%) amount of necrotic tissue within the wound bed including Adherent Slough. Wound #6 status is Open. Original cause of wound was Blister. The wound is located on the Left Toe - Web between 1st and 2nd. The wound measures 2.5cm length x 1cm width x 0.1cm depth; 1.963cm^2 area and 0.196cm^3 volume. There is Fat Layer (Subcutaneous Tissue) Exposed exposed. There is no tunneling or undermining noted. There is a medium amount of  serosanguineous drainage noted. The wound margin is distinct with the outline attached to the wound base. There is large (67-100%) red granulation within the wound bed. There is no necrotic tissue within the wound bed. Wound #7 status is Open. Original cause of wound was Blister. The wound is located on the Left Toe - Web between 2nd and 3rd. The wound measures 2cm length x 1cm width x 0.1cm depth; 1.571cm^2 area and 0.157cm^3 volume. There is Fat Layer (Subcutaneous Tissue) Exposed exposed. There is no tunneling or undermining noted. There is a medium amount of serosanguineous drainage noted. The wound margin is distinct with the outline attached to the wound base. There is large (67-100%) red granulation within the wound bed. There is no necrotic tissue within the wound bed. Wound #8 status is Open. Original cause of wound was Blister. The wound is located on the Left Toe - Web between 3rd and 4th. The wound measures 1.3cm length x 1cm width x 0.1cm depth; 1.021cm^2 area and 0.102cm^3 volume. There is Fat Layer (Subcutaneous Tissue) Exposed exposed. There is no tunneling or undermining noted. There is a medium amount of serosanguineous drainage noted. The wound margin is distinct with the outline attached to the wound base. There is large (67-100%) red granulation within the wound bed. There is no necrotic tissue within the wound bed. Wound #9 status is Open. Original cause of wound was Blister. The wound is located on the Left Toe - Web between 4th and 5th. The wound measures 1cm length x 1cm width x 0.1cm depth; 0.785cm^2 area and 0.079cm^3 volume. There is Fat Layer (Subcutaneous Tissue) Exposed exposed. There is no tunneling or undermining noted. There is a medium amount of serosanguineous drainage noted. The wound margin is distinct with the outline attached to the wound base. There is large (67-100%) red granulation within the wound bed. There is no necrotic tissue within the wound  bed. 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 Dressing Change Frequency: Wound #1 Right Toe - Web between 1st and 2nd: Change dressing every day. Wound #10 Left,Medial Toe Great: Change dressing every day. Wound #11 Left,Anterior Toe Second: Change dressing every day. Wound #2 Right Toe - Web between 2nd and 3rd: Change dressing every day. Wound #3 Right Toe - Web between 3rd and 4th: Change dressing every day. Wound #4 Right Toe - Web between 4th and 5th: Change dressing every day. Wound #5 Right,Posterior Ankle: Change dressing every day. Wound #6 Left Toe - Web between 1st and 2nd: Change dressing every day. Wound #7 Left Toe - Web between 2nd and 3rd: Change dressing every day. Wound #8 Left Toe - Web  between 3rd and 4th: Change dressing every day. Wound #9 Left Toe - Web between 4th and 5th: Change dressing every day. Skin Barriers/Peri-Wound Care: Wound #1 Right Toe - Web between 1st and 2nd: Barrier cream Wound #10 Left,Medial Toe Great: Barrier cream Wound #11 Left,Anterior Toe Second: Barrier cream Wound #2 Right Toe - Web between 2nd and 3rd: Barrier cream Wound #3 Right Toe - Web between 3rd and 4th: Barrier cream Wound #4 Right Toe - Web between 4th and 5th: Barrier cream Wound #5 Right,Posterior Ankle: Barrier cream Wound #6 Left Toe - Web between 1st and 2nd: Barrier cream Wound #7 Left Toe - Web between 2nd and 3rd: Barrier cream Wound #8 Left Toe - Web between 3rd and 4th: Barrier cream Wound #9 Left Toe - Web between 4th and 5th: Barrier cream Wound Cleansing: Wound #1 Right Toe - Web between 1st and 2nd: May shower and wash wound with soap and water. Wound #10 Left,Medial Toe  Great: May shower and wash wound with soap and water. Wound #11 Left,Anterior Toe Second: May shower and wash wound with soap and water. Wound #2 Right Toe - Web between 2nd and 3rd: May shower and wash wound with soap and water. Wound #3 Right Toe - Web between 3rd and 4th: May shower and wash wound with soap and water. Wound #4 Right Toe - Web between 4th and 5th: May shower and wash wound with soap and water. Wound #5 Right,Posterior Ankle: May shower and wash wound with soap and water. Wound #6 Left Toe - Web between 1st and 2nd: May shower and wash wound with soap and water. Wound #7 Left Toe - Web between 2nd and 3rd: May shower and wash wound with soap and water. Wound #8 Left Toe - Web between 3rd and 4th: May shower and wash wound with soap and water. Wound #9 Left Toe - Web between 4th and 5th: May shower and wash wound with soap and water. Primary Wound Dressing: Calcium Alginate with [place] Other: - ketoconazole Secondary Dressing: Kerlix/Rolled Gauze Dry Gauze 1. I continued with the same instructions I gave her last time. 2. I had wanted to order an oral antifungal however Diflucan interacts with Plaquenil and terbinafine with her metoprolol. I will need to give this some more thought. Electronic Signature(s) Signed: 08/10/2019 6:32:44 PM By: Linton Ham MD Entered By: Linton Ham on 08/10/2019 16:32:08 -------------------------------------------------------------------------------- SuperBill Details Patient Name: Date of Service: GRACELY, KUBINA 08/10/2019 Medical Record [id number] Patient Account Number: [id number] Date of Birth/Sex: Treating RN: 1951-03-12 (68 y.o. 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

## 2019-08-11 NOTE — Telephone Encounter (Signed)
[place] stated pt does not need to have the abdomen with and without contrast only the MR Angio is needed. Requesting referral/order to be updated. Please advise.

## 2019-08-11 NOTE — Telephone Encounter (Signed)
[name] was informed of the message below.  [name] stated the orders should be entered for the chest, not abdomen and message sent to Dr Ethlyn Gallery.

## 2019-08-17 MED FILL — KETOCONAZOLE 2% CREAM: 2 | 18 days supply | Qty: 60 | Fill #1

## 2019-08-23 DIAGNOSIS — M25551 Pain in right hip: Secondary | ICD-10-CM | POA: Diagnosis not present

## 2019-08-24 ENCOUNTER — Other Ambulatory Visit: Payer: Self-pay

## 2019-08-24 ENCOUNTER — Encounter (HOSPITAL_BASED_OUTPATIENT_CLINIC_OR_DEPARTMENT_OTHER): Payer: 59 | Admitting: Internal Medicine

## 2019-08-24 DIAGNOSIS — B358 Other dermatophytoses: Secondary | ICD-10-CM | POA: Diagnosis not present

## 2019-08-24 DIAGNOSIS — S90822A Blister (nonthermal), left foot, initial encounter: Secondary | ICD-10-CM | POA: Diagnosis not present

## 2019-08-24 DIAGNOSIS — S91105A Unspecified open wound of left lesser toe(s) without damage to nail, initial encounter: Secondary | ICD-10-CM | POA: Diagnosis not present

## 2019-08-24 DIAGNOSIS — L97511 Non-pressure chronic ulcer of other part of right foot limited to breakdown of skin: Secondary | ICD-10-CM | POA: Insufficient documentation

## 2019-08-24 DIAGNOSIS — S90422A Blister (nonthermal), left great toe, initial encounter: Secondary | ICD-10-CM | POA: Diagnosis not present

## 2019-08-24 DIAGNOSIS — L97521 Non-pressure chronic ulcer of other part of left foot limited to breakdown of skin: Secondary | ICD-10-CM | POA: Diagnosis not present

## 2019-08-24 DIAGNOSIS — I890 Lymphedema, not elsewhere classified: Secondary | ICD-10-CM | POA: Insufficient documentation

## 2019-08-24 DIAGNOSIS — S90821A Blister (nonthermal), right foot, initial encounter: Secondary | ICD-10-CM | POA: Diagnosis not present

## 2019-08-24 NOTE — Progress Notes (Addendum)
IASHA, [name] ([id number]) Visit Report for 08/24/2019 Arrival Information Details Patient Name: Date of Service: [name], [name]. 08/24/2019 1:30 PM Medical Record [id number] Patient Account Number: [id number] Date of Birth/Sex: Treating RN: 06-12-1951 (69 y.o. [name] Primary Care Falisa Lamora: [name] Other Clinician: Referring Yerania Chamorro: Treating Saunders [place]/Extender:Robson, [name], [name] in Treatment: 3 Visit Information History Since Last Visit Added or deleted any medications: No Patient Arrived: Ambulatory Any new allergies or adverse reactions: No Arrival Time: 14:30 Had a fall or experienced change in No Accompanied By: self activities of daily living that may affect Transfer Assistance: None risk of falls: Patient Identification Verified: Yes Signs or symptoms of abuse/neglect since last No Secondary Verification Process Yes visito Completed: Hospitalized since last visit: No Patient Requires Transmission- No Implantable device outside of the clinic excluding No Based Precautions: cellular tissue based products placed in the center Patient Has Alerts: Yes ABI: L 0.99 R0.98 since last visit: Patient Alerts: Has Dressing in Place as Prescribed: Yes 12/15 Pain Present Now: Yes Electronic Signature(s) Signed: 08/24/2019 5:52:47 PM By: Baruch Gouty RN, BSN Entered By: Baruch Gouty on 08/24/2019 14:33:41 -------------------------------------------------------------------------------- Clinic Level of Care Assessment Details Patient Name: Date of Service: [name], [name]. 08/24/2019 1:30 PM Medical Record [name]:[id number] Patient Account Number: [id number] Date of Birth/Sex: Treating RN: 1951/06/06 (69 y.o. [name] Primary Care Julionna Marczak: [name] Other Clinician: Referring Mikell Camp: Treating Salaya Holtrop/Extender:Robson, [name], [name] in Treatment: 3 Clinic Level of Care Assessment  Items TOOL 4 Quantity Score X - Use when only an EandM is performed on FOLLOW-UP visit 1 0 ASSESSMENTS - Nursing Assessment / Reassessment X - Reassessment of Co-morbidities (includes updates in patient status) 1 10 X - Reassessment of Adherence to Treatment Plan 1 5 ASSESSMENTS - Wound and Skin Assessment / Reassessment []  - Simple Wound Assessment / Reassessment - one wound 0 X - Complex Wound Assessment / Reassessment - multiple wounds 3 5 []  - Dermatologic / Skin Assessment (not related to wound area) 0 ASSESSMENTS - Focused Assessment []  - Circumferential Edema Measurements - multi extremities 0 []  - Nutritional Assessment / Counseling / Intervention 0 []  - Lower Extremity Assessment (monofilament, tuning fork, pulses) 0 []  - Peripheral Arterial Disease Assessment (using hand held doppler) 0 ASSESSMENTS - Ostomy and/or Continence Assessment and Care []  - Incontinence Assessment and Management 0 []  - Ostomy Care Assessment and Management (repouching, etc.) 0 PROCESS - Coordination of Care X - Simple Patient / Family Education for ongoing care 1 15 []  - Complex (extensive) Patient / Family Education for ongoing care 0 X - Staff obtains Programmer, systems, Records, Test Results / Process Orders 1 10 []  - Staff telephones HHA, Nursing Homes / Clarify orders / etc 0 []  - Routine Transfer to another Facility (non-emergent condition) 0 []  - Routine Hospital Admission (non-emergent condition) 0 []  - New Admissions / Biomedical engineer / Ordering NPWT, Apligraf, etc. 0 []  - Emergency Hospital Admission (emergent condition) 0 X - Simple Discharge Coordination 1 10 []  - Complex (extensive) Discharge Coordination 0 PROCESS - Special Needs []  - Pediatric / Minor Patient Management 0 []  - Isolation Patient Management 0 []  - Hearing / Language / Visual special needs 0 []  - Assessment of Community assistance (transportation, D/C planning, etc.) 0 []  - Additional assistance / Altered mentation  0 []  - Support Surface(s) Assessment (bed, cushion, seat, etc.) 0 INTERVENTIONS - Wound Cleansing / Measurement []  - Simple Wound Cleansing - one wound 0 X - Complex Wound Cleansing -  multiple wounds 3 5 X - Wound Imaging (photographs - any number of wounds) 1 5 []  - Wound Tracing (instead of photographs) 0 []  - Simple Wound Measurement - one wound 0 X - Complex Wound Measurement - multiple wounds 3 5 INTERVENTIONS - Wound Dressings X - Small Wound Dressing one or multiple wounds 3 10 []  - Medium Wound Dressing one or multiple wounds 0 []  - Large Wound Dressing one or multiple wounds 0 []  - Application of Medications - topical 0 []  - Application of Medications - injection 0 INTERVENTIONS - Miscellaneous []  - External ear exam 0 []  - Specimen Collection (cultures, biopsies, blood, body fluids, etc.) 0 []  - Specimen(s) / Culture(s) sent or taken to Lab for analysis 0 []  - Patient Transfer (multiple staff / Civil Service fast streamer / Similar devices) 0 []  - Simple Staple / Suture removal (25 or less) 0 []  - Complex Staple / Suture removal (26 or more) 0 []  - Hypo / Hyperglycemic Management (close monitor of Blood Glucose) 0 []  - Ankle / Brachial Index (ABI) - do not check if billed separately 0 X - Vital Signs 1 5 Has the patient been seen at the hospital within the last three years: Yes Total Score: 135 Level Of Care: New/Established - Level 4 Electronic Signature(s) Signed: 08/24/2019 5:08:58 PM By: Carlene Coria RN Entered By: Carlene Coria on 08/24/2019 15:31:28 -------------------------------------------------------------------------------- Encounter Discharge Information Details Patient Name: Date of Service: [name]. 08/24/2019 1:30 PM Medical Record [name]:[id number] Patient Account Number: [id number] Date of Birth/Sex: Treating RN: [birth year] (69 y.o. [name] Primary Care Hiilani Jetter: [name] Other Clinician: Referring Marshel Golubski: Treating  Sharvil Hoey/Extender:Robson, [name], [name] in Treatment: 3 Encounter Discharge Information Items Discharge Condition: Stable Ambulatory Status: Ambulatory Discharge Destination: Home Transportation: Private Auto Accompanied By: self Schedule Follow-up Appointment: Yes Clinical Summary of Care: Patient Declined Electronic Signature(s) Signed: 08/24/2019 5:45:44 PM By: Kela Millin Entered By: Kela Millin on 08/24/2019 16:20:24 -------------------------------------------------------------------------------- Lower Extremity Assessment Details Patient Name: Date of Service: [name]. 08/24/2019 1:30 PM Medical Record [name]:[id number] Patient Account Number: [id number] Date of Birth/Sex: Treating RN: May 04, 1951 (69 y.o. [name] Primary Care Ezeriah Luty: [name] Other Clinician: Referring Kiley Torrence: Treating Dailen Mcclish/Extender:Robson, [name], [name] in Treatment: 3 Edema Assessment Assessed: [Left: No] [Right: No] Edema: [Left: Yes] [Right: Yes] Calf Left: Right: Point of Measurement: 31 cm From Medial Instep 46 cm 47 cm Ankle Left: Right: Point of Measurement: 9 cm From Medial Instep 27.8 cm 27.3 cm Vascular Assessment Pulses: Dorsalis Pedis Palpable: [Left:Yes] [Right:Yes] Electronic Signature(s) Signed: 08/24/2019 5:52:47 PM By: Baruch Gouty RN, BSN Entered By: Baruch Gouty on 08/24/2019 14:54:29 -------------------------------------------------------------------------------- Multi Wound Chart Details Patient Name: Date of Service: [name]. 08/24/2019 1:30 PM Medical Record [name]:[id number] Patient Account Number: [id number] Date of Birth/Sex: Treating RN: [birth year] (69 y.o. F) Primary Care Corryn Madewell: [name] Other Clinician: Referring Vernida Mcnicholas: Treating Eithan Beagle/Extender:Robson, [name], [name] in Treatment: 3 Vital Signs Height(in): 66 Pulse(bpm): 34 Weight(lbs):  260 Blood Pressure(mmHg): 157/67 Body Mass Index(BMI): 42 Temperature(F): 98.2 Respiratory 18 Rate(breaths/min): Photos: [1:No Photos] [10:No Photos] [11:No Photos] Wound Location: [1:Right Toe - Web between 1st and 2nd] [10:Left Toe Great - Medial] [11:Left Toe Second - Anterior] Wounding Event: [1:Blister] [10:Blister] [11:Blister] Primary Etiology: [1:Lymphedema] [10:Lymphedema] [11:Lymphedema] Comorbid History: [1:Chronic sinus problems/congestion, Lymphedema, Hypertension, Rheumatoid Arthritis, Neuropathy] [10:Chronic sinus problems/congestion, Lymphedema, Hypertension, Rheumatoid Arthritis, Neuropathy] [11:Chronic sinus problems/congestion,  Lymphedema, Hypertension, Rheumatoid Arthritis, Neuropathy] Date Acquired: [1:07/23/2019] [10:07/23/2019] [11:07/23/2019] Weeks of Treatment: [1:3] [10:3] [11:3]  Wound Status: [1:Healed - Epithelialized] [10:Open] [11:Open] Measurements L x W x D 0x0x0 [10:0.1x0.1x0.1] [11:0.4x0.5x0.1] (cm) Area (cm) : [1:0] [10:0.008] [11:0.157] Volume (cm) : [1:0] [10:0.001] [11:0.016] % Reduction in Area: [1:100.00%] [10:98.00%] [11:64.30%] % Reduction in Volume: 100.00% [10:97.40%] [11:63.60%] Classification: [1:Partial Thickness] [10:Full Thickness Without Exposed Support Structures Exposed Support Structures] [11:Full Thickness Without] Exudate Amount: [1:None Present] [10:Small] [11:None Present] Exudate Type: [1:N/A] [10:Serosanguineous] [11:N/A] Exudate Color: [1:N/A] [10:red, brown] [11:N/A] Wound Margin: [1:Distinct, outline attached] [10:Distinct, outline attached Distinct, outline attached] Granulation Amount: [1:None Present (0%)] [10:Large (67-100%)] [11:None Present (0%)] Granulation Quality: [1:N/A] [10:Pink] [11:N/A] Necrotic Amount: [1:None Present (0%)] [10:None Present (0%)] [11:Large (67-100%)] Exposed Structures: [1:Fascia: No Fat Layer (Subcutaneous Tissue) Exposed: No Tendon: No Muscle: No Joint: No Bone: No] [10:Fat Layer  (Subcutaneous Tissue) Exposed: Yes Fascia: No Tendon: No Muscle: No Joint: No Bone: No] [11:Fat Layer (Subcutaneous Tissue) Exposed: Yes  Fascia: No Tendon: No Muscle: No Joint: No Bone: No] Epithelialization: [1:Large (67-100%)] [10:Large (67-100%) 13 2] [11:Small (1-33%) 3] Photos: [1:No Photos] [10:No Photos] [11:No Photos] Wound Location: [1:Right Toe Third] [10:Right Toe - Web between 2nd and 3rd] [11:Right Toe - Web between 3rd and 4th] Wounding Event: [1:Gradually Appeared] [10:Blister] [11:Blister] Primary Etiology: [1:Lymphedema] [10:Lymphedema] [11:Lymphedema] Comorbid History: [1:Chronic sinus problems/congestion, Lymphedema, Hypertension, Rheumatoid Hypertension, Rheumatoid Arthritis, Neuropathy] [10:Chronic sinus problems/congestion, Lymphedema, Arthritis, Neuropathy] [11:Chronic sinus problems/congestion,  Lymphedema, Hypertension, Rheumatoid Arthritis, Neuropathy] Date Acquired: [1:08/10/2019] [10:07/23/2019] [11:07/23/2019] Weeks of Treatment: [1:2] [10:3] [11:3] Wound Status: [1:Open] [10:Healed - Epithelialized] [11:Healed - Epithelialized] Measurements L x W x D 0.1x0.1x0.1 [10:0x0x0] [11:0x0x0] (cm) Area (cm) : [1:0.008] [10:0] [11:0] Volume (cm) : [1:0.001] [10:0] [11:0] % Reduction in Area: [1:95.20%] [10:100.00%] [11:100.00%] % Reduction in Volume: 93.80% [10:100.00%] [11:100.00%] Classification: [1:Full Thickness Without Exposed Support Structures] [10:Partial Thickness] [11:Partial Thickness] Exudate Amount: [1:None Present] [10:None Present] [11:None Present] Exudate Type: [1:N/A] [10:N/A] [11:N/A] Exudate Color: [1:N/A] [10:N/A] [11:N/A] Wound Margin: [1:Distinct, outline attached Distinct, outline attached] [11:Distinct, outline attached] Granulation Amount: [1:Small (1-33%)] [10:None Present (0%)] [11:None Present (0%)] Granulation Quality: [1:Pink] [10:N/A] [11:N/A] Necrotic Amount: [1:None Present (0%)] [10:None Present (0%)] [11:None Present (0%)] Exposed  Structures: [1:Fascia: No Fat Layer (Subcutaneous Fat Layer (Subcutaneous Tissue) Exposed: No Tendon: No Muscle: No Joint: No Bone: No Limited to Skin Breakdown] [10:Fascia: No Tissue) Exposed: No Tendon: No Muscle: No Joint: No Bone: No] [11:Fascia: No Fat Layer  (Subcutaneous Tissue) Exposed: No Tendon: No Muscle: No Joint: No Bone: No] Epithelialization: [1:Large (67-100%)] [10:Small (1-33%) 4 5] [11:Large (67-100%) 6] Photos: [1:No Photos] [10:No Photos] [11:No Photos] Wound Location: [1:Right Toe - Web between 4th and 5th] [10:Right Ankle - Posterior Left Toe - Web between 1st] [11:and 2nd] Wounding Event: [1:Blister] [10:Blister] [11:Blister] Primary Etiology: [1:Lymphedema] [10:Lymphedema] [11:Lymphedema] Comorbid History: [1:Chronic sinus problems/congestion, Lymphedema, Hypertension, Rheumatoid Arthritis, Neuropathy] [10:Chronic sinus problems/congestion, Lymphedema, Hypertension, Rheumatoid Hypertension, Rheumatoid Arthritis, Neuropathy] [11:Chronic  sinus problems/congestion, Lymphedema, Arthritis, Neuropathy] Date Acquired: [1:07/23/2019] [10:07/23/2019] [11:07/23/2019] Weeks of Treatment: [1:3] [10:3] [11:3] Wound Status: [1:Healed - Epithelialized] [10:Healed - Epithelialized] [11:Healed - Epithelialized] Measurements L x W x D [1:0x0x0] [10:0x0x0] [11:0x0x0] (cm) Area (cm) : [1:0] [10:0] [11:0] Volume (cm) : [1:0] [10:0] [11:0] % Reduction in Area: [1:100.00%] [10:100.00%] [11:100.00%] % Reduction in Volume: [1:100.00%] [10:100.00%] [11:100.00%] Classification: [1:Partial Thickness] [10:Full Thickness Without Exposed Support Structures] [11:Partial Thickness] Exudate Amount: [1:None Present] [10:None Present] [11:None Present] Exudate Type: [1:N/A] [10:N/A] [11:N/A] Exudate Color: [1:N/A] [10:N/A] [11:N/A] Wound Margin: [1:Distinct, outline attached] [10:Distinct, outline attached Distinct, outline attached] Granulation Amount: [1:None Present (0%)] [10:None Present (0%)]  [  11:None Present (0%)] Granulation Quality: [1:N/A] [10:N/A] [11:N/A] Necrotic Amount: [1:None Present (0%)] [10:None Present (0%)] [11:None Present (0%)] Exposed Structures: [1:Fascia: No Fat Layer (Subcutaneous Tissue) Exposed: No Tendon: No Muscle: No Joint: No Bone: No] [10:Fascia: No Fat Layer (Subcutaneous Fat Layer (Subcutaneous Tissue) Exposed: No Tendon: No Muscle: No Joint: No Bone: No] [11:Fascia: No Tissue)  Exposed: No Tendon: No Muscle: No Joint: No Bone: No] Epithelialization: [1:Large (67-100%)] [10:Large (67-100%) 7 8] [11:Large (67-100%)] Photos: [1:No Photos] [10:No Photos] [11:No Photos] Wound Location: [1:Left Toe - Web between 2nd Left Toe - Web between 3rd and 3rd] [10:and 4th] [11:Left Toe - Web between 4th and 5th] Wounding Event: [1:Blister] [10:Blister] [11:Blister] Primary Etiology: [1:Lymphedema] [10:Lymphedema] [11:Lymphedema] Comorbid History: [1:Chronic sinus problems/congestion, Lymphedema, Hypertension, Rheumatoid Hypertension, Rheumatoid Arthritis, Neuropathy] [10:Chronic sinus problems/congestion, Lymphedema, Arthritis, Neuropathy] [11:Chronic sinus problems/congestion,  Lymphedema, Hypertension, Rheumatoid Arthritis, Neuropathy] Date Acquired: [1:07/23/2019] [10:07/23/2019] [11:07/23/2019] Weeks of Treatment: [1:3] [10:3] [11:3] Wound Status: [1:Healed - Epithelialized] [10:Healed - Epithelialized] [11:Open] Measurements L x W x D 0x0x0 [10:0x0x0] [11:0x0x0] (cm) Area (cm) : [1:0] [10:0] [11:0] Volume (cm) : [1:0] [10:0] [11:0] % Reduction in Area: [1:100.00%] [10:100.00%] [11:100.00%] % Reduction in Volume: 100.00% [10:100.00%] [11:100.00%] Classification: [1:Partial Thickness] [10:Partial Thickness] [11:Partial Thickness] Exudate Amount: [1:None Present] [10:None Present] [11:None Present] Exudate Type: [1:N/A] [10:N/A] [11:N/A] Exudate Color: [1:N/A] [10:N/A] [11:N/A] Wound Margin: [1:Distinct, outline attached Distinct, outline attached] [11:Distinct,  outline attached] Granulation Amount: [1:None Present (0%)] [10:None Present (0%)] [11:None Present (0%)] Granulation Quality: [1:N/A] [10:N/A] [11:N/A] Necrotic Amount: [1:None Present (0%)] [10:None Present (0%)] [11:None Present (0%)] Exposed Structures: [1:Fascia: No Fat Layer (Subcutaneous Fat Layer (Subcutaneous Tissue) Exposed: No Tendon: No Muscle: No Joint: No Bone: No] [10:Fascia: No Tissue) Exposed: No Tendon: No Muscle: No Joint: No Bone: No] [11:Fascia: No Fat Layer (Subcutaneous Tissue)  Exposed: No Tendon: No Muscle: No Joint: No Bone: No] Epithelialization: [1:Large (67-100%)] [10:Large (67-100%)] [11:Large (67-100%)] Electronic Signature(s) Signed: 08/24/2019 5:30:44 PM By: Linton Ham MD Entered By: Linton Ham on 08/24/2019 15:25:44 -------------------------------------------------------------------------------- Multi-Disciplinary Care Plan Details Patient Name: Date of Service: [name]. 08/24/2019 1:30 PM Medical Record [name]:[id number] Patient Account Number: [id number] Date of Birth/Sex: Treating RN: November 12, 1950 (69 y.o. [name] Primary Care Graesyn Schreifels: [name] Other Clinician: Referring Wilhelmine Krogstad: Treating Naheem Mosco/Extender:Robson, [name], [name] in Treatment: 3 Active Inactive Wound/Skin Impairment Nursing Diagnoses: Knowledge deficit related to ulceration/compromised skin integrity Goals: Patient/caregiver will verbalize understanding of skin care regimen Date Initiated: 08/03/2019 Target Resolution Date: 09/03/2019 Goal Status: Active Ulcer/skin breakdown will have a volume reduction of 30% by week 4 Date Initiated: 08/03/2019 Target Resolution Date: 09/03/2019 Goal Status: Active Interventions: Assess patient/caregiver ability to obtain necessary supplies Assess patient/caregiver ability to perform ulcer/skin care regimen upon admission and as needed Assess ulceration(s) every visit Notes: Electronic  Signature(s) Signed: 08/24/2019 5:08:58 PM By: Carlene Coria RN Entered By: Carlene Coria on 08/24/2019 14:02:46 -------------------------------------------------------------------------------- Pain Assessment Details Patient Name: Date of Service: [name], [name] 08/24/2019 1:30 PM Medical Record [name]:[id number] Patient Account Number: [id number] Date of Birth/Sex: Treating RN: Dec 29, 1950 (69 y.o. [name] Primary Care Vicie Cech: [name] Other Clinician: Referring Lache Dagher: Treating Kilynn Fitzsimmons/Extender:Robson, [name], [name] in Treatment: 3 Active Problems Location of Pain Severity and Description of Pain Patient Has Paino Yes Site Locations Pain Location: Generalized Pain With Dressing Change: Yes Duration of the Pain. Constant / Intermittento Intermittent Rate the pain. Current Pain Level: 1 Worst Pain Level: 6 Least Pain Level: 0 Character of Pain Describe the Pain: Burning, Other:  hurting Pain Management and Medication Current Pain Management: Medication: Yes Is the Current Pain Management Adequate: Adequate Rest: Yes How does your wound impact your activities of daily livingo Sleep: No Bathing: No Appetite: No Relationship With Others: No Bladder Continence: No Emotions: No Bowel Continence: No Work: No Toileting: No Drive: No Dressing: No Hobbies: No Notes c/o general bilateral foot pain with activity Electronic Signature(s) Signed: 08/24/2019 5:52:47 PM By: Baruch Gouty RN, BSN Entered By: Baruch Gouty on 08/24/2019 14:53:02 -------------------------------------------------------------------------------- Patient/Caregiver Education Details Patient Name: Date of Service: [name] 1/5/2021andnbsp1:30 PM Medical Record [name]:[id number] Patient Account Number: [id number] Date of Birth/Gender: Treating RN: [birth year] (69 y.o. [name] Primary Care Physician: [name] Other  Clinician: Referring Physician: Treating Physician/Extender:Robson, [name], [name] in Treatment: 3 Education Assessment Education Provided To: Patient Education Topics Provided Wound/Skin Impairment: Methods: Explain/Verbal Responses: State content correctly Electronic Signature(s) Signed: 08/24/2019 5:08:58 PM By: Carlene Coria RN Entered By: Carlene Coria on 08/24/2019 14:03:01 -------------------------------------------------------------------------------- Wound Assessment Details Patient Name: Date of Service: MIRENDA, FLIER. 08/24/2019 1:30 PM Medical Record [name]:[id number] Patient Account Number: [id number] Date of Birth/Sex: Treating RN: 1951/06/01 (69 y.o. [name] Primary Care Masashi Snowdon: [name] Other Clinician: Referring Llewellyn Choplin: Treating Aldea Avis/Extender:Robson, [name], [name] in Treatment: 3 Wound Status Wound Number: 1 Primary Lymphedema Etiology: Wound Location: Right Toe - Web between 1st and 2nd Wound Healed - Epithelialized Status: Wounding Event: Blister Comorbid Chronic sinus problems/congestion, Date Acquired: 07/23/2019 History: Lymphedema, Hypertension, Rheumatoid Weeks Of Treatment: 3 Arthritis, Neuropathy Clustered Wound: No Photos Wound Measurements Length: (cm) 0 % Reducti Width: (cm) 0 % Reducti Depth: (cm) 0 Epithelia Area: (cm) 0 Tunneling Volume: (cm) 0 Undermini Wound Description Classification: Partial Thickness Wound Margin: Distinct, outline attached Exudate Amount: None Present Wound Bed Granulation Amount: None Present (0%) Necrotic Amount: None Present (0%) Foul Odor After Cleansing: No Slough/Fibrino No Exposed Structure Fascia Exposed: No Fat Layer (Subcutaneous Tissue) Exposed: No Tendon Exposed: No Muscle Exposed: No Joint Exposed: No Bone Exposed: No on in Area: 100% on in Volume: 100% lization: Large (67-100%) : No ng: No Electronic Signature(s) Signed:  08/25/2019 3:43:47 PM By: Mikeal Hawthorne EMT/HBOT Signed: 08/25/2019 6:06:10 PM By: Baruch Gouty RN, BSN Previous Signature: 08/24/2019 5:52:47 PM Version By: Baruch Gouty RN, BSN Entered By: Mikeal Hawthorne on 08/25/2019 09:41:03 -------------------------------------------------------------------------------- Wound Assessment Details Patient Name: Date of Service: [name]. 08/24/2019 1:30 PM Medical Record [name]:[id number] Patient Account Number: [id number] Date of Birth/Sex: Treating RN: 1951-01-14 (69 y.o. [name] Primary Care Aster Eckrich: [name] Other Clinician: Referring Melvine Julin: Treating Harlym Gehling/Extender:Robson, [name], [name] in Treatment: 3 Wound Status Wound Number: 10 Primary Lymphedema Etiology: Wound Location: Left Toe Great - Medial Wound Open Wounding Event: Blister Status: Date Acquired: 07/23/2019 Comorbid Chronic sinus problems/congestion, Weeks Of Treatment: 3 History: Lymphedema, Hypertension, Rheumatoid Clustered Wound: No Arthritis, Neuropathy Photos Wound Measurements Length: (cm) 0.1 % Reduct Width: (cm) 0.1 % Reduct Depth: (cm) 0.1 Epitheli Area: (cm) 0.008 Tunneli Volume: (cm) 0.001 Undermi Wound Description Full Thickness Without Exposed Support Foul Odo Classification: Structures Slough/F Wound Distinct, outline attached Margin: Exudate Small Amount: Exudate Serosanguineous Type: Exudate red, brown Color: Wound Bed Granulation Amount: Large (67-100%) Granulation Quality: Pink Fascia E Necrotic Amount: None Present (0%) Fat Laye Tendon E Muscle E Joint Ex Bone Exp r After Cleansing: No ibrino Yes Exposed Structure xposed: No r (Subcutaneous Tissue) Exposed: Yes xposed: No xposed: No posed: No osed: No ion in Area: 98% ion in Volume: 97.4%  alization: Large (67-100%) ng: No ning: No Treatment Notes Wound #10 (Left, Medial Toe Great) 1. Cleanse With Wound Cleanser 3. Primary  Dressing Applied Calcium Alginate Ag Other primary dressing (specifiy in notes) 4. Secondary Dressing Dry Gauze Notes ketoconazole cream and silver alginate Electronic Signature(s) Signed: 08/25/2019 3:43:47 PM By: Mikeal Hawthorne EMT/HBOT Signed: 08/25/2019 6:06:10 PM By: Baruch Gouty RN, BSN Previous Signature: 08/24/2019 5:52:47 PM Version By: Baruch Gouty RN, BSN Entered By: Mikeal Hawthorne on 08/25/2019 09:36:27 -------------------------------------------------------------------------------- Wound Assessment Details Patient Name: Date of Service: [name]. 08/24/2019 1:30 PM Medical Record [id number] Patient Account Number: [id number] Date of Birth/Sex: Treating RN: 30-Jun-1951 (69 y.o. [name] Primary Care Danna Sewell: [name] Other Clinician: Referring Raegyn Renda: Treating Amand Lemoine/Extender:Robson, [name], [name] in Treatment: 3 Wound Status Wound Number: 11 Primary Lymphedema Etiology: Wound Location: Left Toe Second - Anterior Wound Open Wounding Event: Blister Status: Date Acquired: 07/23/2019 Comorbid Chronic sinus problems/congestion, Weeks Of Treatment: 3 History: Lymphedema, Hypertension, Rheumatoid Clustered Wound: No Arthritis, Neuropathy Photos Wound Measurements Length: (cm) 0.4 % Reduct Width: (cm) 0.5 % Reduct Depth: (cm) 0.1 Epitheli Area: (cm) 0.157 Tunneli Volume: (cm) 0.016 Undermi Wound Description Full Thickness Without Exposed Support Foul Odo Classification: Structures Slough/F Wound Distinct, outline attached Margin: Exudate None Present Amount: Wound Bed Granulation Amount: None Present (0%) Necrotic Amount: Large (67-100%) Fascia E Necrotic Quality: Adherent Slough Fat Laye Tendon E Muscle E Joint Ex Bone Exp r After Cleansing: No ibrino Yes Exposed Structure xposed: No r (Subcutaneous Tissue) Exposed: Yes xposed: No xposed: No posed: No osed: No ion in Area: 64.3% ion in  Volume: 63.6% alization: Small (1-33%) ng: No ning: No Treatment Notes Wound #11 (Left, Anterior Toe Second) 1. Cleanse With Wound Cleanser 3. Primary Dressing Applied Calcium Alginate Ag Other primary dressing (specifiy in notes) 4. Secondary Dressing Dry Gauze Notes ketoconazole cream and silver alginate Electronic Signature(s) Signed: 08/25/2019 3:43:47 PM By: Mikeal Hawthorne EMT/HBOT Signed: 08/25/2019 6:06:10 PM By: Baruch Gouty RN, BSN Previous Signature: 08/24/2019 5:52:47 PM Version By: Baruch Gouty RN, BSN Entered By: Mikeal Hawthorne on 08/25/2019 09:36:46 -------------------------------------------------------------------------------- Wound Assessment Details Patient Name: Date of Service: [name]. 08/24/2019 1:30 PM Medical Record [id number] Patient Account Number: [id number] Date of Birth/Sex: Treating RN: 1951-06-01 (69 y.o. [name] Primary Care Joshawa Dubin: [name] Other Clinician: Referring Natelie Ostrosky: Treating Hansen Carino/Extender:Robson, [name], [name] in Treatment: 3 Wound Status Wound Number: 13 Primary Lymphedema Etiology: Wound Location: Right Toe Third Wound Open Wounding Event: Gradually Appeared Status: Date Acquired: 08/10/2019 Comorbid Chronic sinus problems/congestion, Weeks Of Treatment: 2 History: Lymphedema, Hypertension, Rheumatoid Clustered Wound: No Arthritis, Neuropathy Photos Wound Measurements Length: (cm) 0.1 % Reduct Width: (cm) 0.1 % Reduct Depth: (cm) 0.1 Epitheli Area: (cm) 0.008 Tunneli Volume: (cm) 0.001 Undermi Wound Description Classification: Full Thickness Without Exposed Support Foul Odo Structures Slough/F Wound Distinct, outline attached Margin: Exudate None Present Amount: Wound Bed Granulation Amount: Small (1-33%) Granulation Quality: Pink Fascia E Necrotic Amount: None Present (0%) Fat Layer Tendon Ex Muscle Ex Joint Exp Bone Expo Limited t r After  Cleansing: No ibrino No Exposed Structure xposed: No (Subcutaneous Tissue) Exposed: No posed: No posed: No osed: No sed: No o Skin Breakdown ion in Area: 95.2% ion in Volume: 93.8% alization: Large (67-100%) ng: No ning: No Treatment Notes Wound #13 (Right Toe Third) 1. Cleanse With Wound Cleanser 3. Primary Dressing Applied Calcium Alginate Ag Other primary dressing (specifiy in notes) 4. Secondary Dressing Dry Gauze Notes ketoconazole cream and  silver alginate Electronic Signature(s) Signed: 08/25/2019 3:43:47 PM By: Mikeal Hawthorne EMT/HBOT Signed: 08/25/2019 6:06:10 PM By: Baruch Gouty RN, BSN Previous Signature: 08/24/2019 5:52:47 PM Version By: Baruch Gouty RN, BSN Entered By: Mikeal Hawthorne on 08/25/2019 09:38:52 -------------------------------------------------------------------------------- Wound Assessment Details Patient Name: Date of Service: [name]. 08/24/2019 1:30 PM Medical Record [name]:[id number] Patient Account Number: [id number] Date of Birth/Sex: Treating RN: [birth year] (69 y.o. [name] Primary Care Ai Sonnenfeld: [name] Other Clinician: Referring Jerzi Tigert: Treating Bernell Sigal/Extender:Robson, [name], [name] in Treatment: 3 Wound Status Wound Number: 2 Primary Lymphedema Etiology: Wound Location: Right Toe - Web between 2nd and 3rd Wound Healed - Epithelialized Status: Wounding Event: Blister Comorbid Chronic sinus problems/congestion, Date Acquired: 07/23/2019 History: Lymphedema, Hypertension, Rheumatoid Weeks Of Treatment: 3 Arthritis, Neuropathy Clustered Wound: No Photos Wound Measurements Length: (cm) 0 % Reducti Width: (cm) 0 % Reducti Depth: (cm) 0 Epithelia Area: (cm) 0 Tunnelin Volume: (cm) 0 Undermin Wound Description Classification: Partial Thickness Wound Margin: Distinct, outline attached Exudate Amount: None Present Wound Bed Granulation Amount: None Present (0%) Necrotic  Amount: None Present (0%) Foul Odor After Cleansing: No Slough/Fibrino No Exposed Structure Fascia Exposed: No Fat Layer (Subcutaneous Tissue) Exposed: No Tendon Exposed: No Muscle Exposed: No Joint Exposed: No Bone Exposed: No on in Area: 100% on in Volume: 100% lization: Small (1-33%) g: No ing: No Electronic Signature(s) Signed: 08/25/2019 3:43:47 PM By: Mikeal Hawthorne EMT/HBOT Signed: 08/25/2019 6:06:10 PM By: Baruch Gouty RN, BSN Previous Signature: 08/24/2019 5:52:47 PM Version By: Baruch Gouty RN, BSN Entered By: Mikeal Hawthorne on 08/25/2019 09:40:36 -------------------------------------------------------------------------------- Wound Assessment Details Patient Name: Date of Service: [name]. 08/24/2019 1:30 PM Medical Record [name]:[id number] Patient Account Number: [id number] Date of Birth/Sex: Treating RN: [birth year] (69 y.o. [name] Primary Care Darthula Desa: [name] Other Clinician: Referring Edison Nicholson: Treating Bernell Sigal/Extender:Robson, [name], [name] in Treatment: 3 Wound Status Wound Number: 3 Primary Lymphedema Etiology: Wound Location: Right Toe - Web between 3rd and 4th Wound Healed - Epithelialized Status: Wounding Event: Blister ComorbidChronic sinus problems/congestion, Date Acquired: 07/23/2019 Date Acquired: 07/23/2019 History: Lymphedema, Hypertension, Rheumatoid Weeks Of Treatment: 3 Arthritis, Neuropathy Clustered Wound: No Photos Wound Measurements Length: (cm) 0 % Reductio Width: (cm) 0 % Reductio Depth: (cm) 0 Epithelial Area: (cm) 0 Tunneling Volume: (cm) 0 Undermini Wound Description Classification: Partial Thickness Wound Margin: Distinct, outline attached Exudate Amount: None Present Wound Bed Granulation Amount: None Present (0%) Necrotic Amount: None Present (0%) Foul Odor After Cleansing: No Slough/Fibrino No Exposed Structure Fascia Exposed: No Fat Layer (Subcutaneous Tissue)  Exposed: No Tendon Exposed: No Muscle Exposed: No Joint Exposed: No Bone Exposed: No n in Area: 100% n in Volume: 100% ization: Large (67-100%) : No ng: No Electronic Signature(s) Signed: 08/25/2019 3:43:47 PM By: Mikeal Hawthorne EMT/HBOT Signed: 08/25/2019 6:06:10 PM By: Baruch Gouty RN, BSN Previous Signature: 08/24/2019 5:52:47 PM Version By: Baruch Gouty RN, BSN Entered By: Mikeal Hawthorne on 08/25/2019 09:40:10 -------------------------------------------------------------------------------- Wound Assessment Details Patient Name: Date of Service: [name]. 08/24/2019 1:30 PM Medical Record [name]:[id number] Patient Account Number: [id number] Date of Birth/Sex: Treating RN: 1951/04/27 (69 y.o. [name] Primary Care Reynoldo Mainer: [name] Other Clinician: Referring Drequan Ironside: Treating Purcell Jungbluth/Extender:Robson, [name], [name] in Treatment: 3 Wound Status Wound Number: 4 Primary Lymphedema Etiology: Wound Location: Right Toe - Web between 4th and 5th Wound Healed - Epithelialized Status: Wounding Event: Blister Comorbid Chronic sinus problems/congestion, Date Acquired: 07/23/2019 History: Lymphedema, Hypertension, Rheumatoid Weeks Of Treatment: 3 Arthritis, Neuropathy Clustered Wound: No  Photos Wound Measurements Length: (cm) 0 % Reductio Width: (cm) 0 % Reductio Depth: (cm) 0 Epithelial Area: (cm) 0 Tunneling Volume: (cm) 0 Undermini Wound Description Classification: Partial Thickness Wound Margin: Distinct, outline attached Exudate Amount: None Present Wound Bed Granulation Amount: None Present (0%) Necrotic Amount: None Present (0%) Foul Odor After Cleansing: No Slough/Fibrino No Exposed Structure Fascia Exposed: No Fat Layer (Subcutaneous Tissue) Exposed: No Tendon Exposed: No Muscle Exposed: No Joint Exposed: No Bone Exposed: No n in Area: 100% n in Volume: 100% ization: Large (67-100%) : No ng: No Electronic  Signature(s) Signed: 08/25/2019 3:43:47 PM By: Mikeal Hawthorne EMT/HBOT Signed: 08/25/2019 6:06:10 PM By: Baruch Gouty RN, BSN Previous Signature: 08/24/2019 5:52:47 PM Version By: Baruch Gouty RN, BSN Entered By: Mikeal Hawthorne on 08/25/2019 09:39:44 -------------------------------------------------------------------------------- Wound Assessment Details Patient Name: Date of Service: [name]. 08/24/2019 1:30 PM Medical Record [name]:[id number] Patient Account Number: [id number] Date of Birth/Sex: Treating RN: 10/12/1950 (69 y.o. [name] Primary Care Breindy Meadow: [name] Other Clinician: Referring Amandine Covino: Treating Zayra Devito/Extender:Robson, [name], [name] in Treatment: 3 Wound Status Wound Number: 5 Primary Lymphedema Etiology: Wound Location: Right Ankle - Posterior Wound Healed - Epithelialized Wounding Event: Blister Status: Date Acquired: 07/23/2019 Comorbid Chronic sinus problems/congestion, Weeks Of Treatment: 3 History: Lymphedema, Hypertension, Rheumatoid Clustered Wound: No Arthritis, Neuropathy Photos Wound Measurements Length: (cm) 0 % Reduct Width: (cm) 0 % Reduct Depth: (cm) 0 Epitheli Area: (cm) 0 Tunneli Volume: (cm) 0 Undermi Wound Description Classification: Full Thickness Without Exposed Support Foul Odo Structures Slough/F Wound Distinct, outline attached Margin: Exudate None Present Amount: Wound Bed Granulation Amount: None Present (0%) Necrotic Amount: None Present (0%) Fascia E Fat Laye Tendon E Muscle E Joint Ex Bone Exp r After Cleansing: No ibrino No Exposed Structure xposed: No r (Subcutaneous Tissue) Exposed: No xposed: No xposed: No posed: No osed: No ion in Area: 100% ion in Volume: 100% alization: Large (67-100%) ng: No ning: No Electronic Signature(s) Signed: 08/25/2019 3:43:47 PM By: Mikeal Hawthorne EMT/HBOT Signed: 08/25/2019 6:06:10 PM By: Baruch Gouty RN, BSN Previous  Signature: 08/24/2019 5:52:47 PM Version By: Baruch Gouty RN, BSN Entered By: Mikeal Hawthorne on 08/25/2019 09:39:18 -------------------------------------------------------------------------------- Wound Assessment Details Patient Name: Date of Service: [name]. 08/24/2019 1:30 PM Medical Record [name]:[id number] Patient Account Number: [id number] Date of Birth/Sex: Treating RN: June 20, 1951 (69 y.o. [name] Primary Care Sunset Joshi: [name] Other Clinician: Referring Corrion Stirewalt: Treating Tilman Mcclaren/Extender:Robson, [name], [name] in Treatment: 3 Wound Status Wound Number: 6 Primary Lymphedema Etiology: Wound Location: Left Toe - Web between 1st and 2nd Wound Healed - Epithelialized Status: Wounding Event: Blister Comorbid Chronic sinus problems/congestion, Date Acquired: 07/23/2019 History: Lymphedema, Hypertension, Rheumatoid Weeks Of Treatment: 3 Arthritis, Neuropathy Clustered Wound: No Photos Wound Measurements Length: (cm) 0 % Reductio Width: (cm) 0 % Reductio Depth: (cm) 0 Epithelial Area: (cm) 0 Tunneling Volume: (cm) 0 Undermini Wound Description Classification: Partial Thickness Wound Margin: Distinct, outline attached Exudate Amount: None Present Wound Bed Granulation Amount: None Present (0%) Necrotic Amount: None Present (0%) Foul Odor After Cleansing: No Slough/Fibrino No Exposed Structure Fascia Exposed: No Fat Layer (Subcutaneous Tissue) Exposed: No Tendon Exposed: No Muscle Exposed: No Joint Exposed: No Bone Exposed: No n in Area: 100% n in Volume: 100% ization: Large (67-100%) : No ng: No Electronic Signature(s) Signed: 08/25/2019 3:43:47 PM By: Mikeal Hawthorne EMT/HBOT Signed: 08/25/2019 6:06:10 PM By: Baruch Gouty RN, BSN Previous Signature: 08/24/2019 5:52:47 PM Version By: Baruch Gouty RN, BSN Entered By: Mikeal Hawthorne  on 08/25/2019  09:38:33 -------------------------------------------------------------------------------- Wound Assessment Details Patient Name: Date of Service: [name], [name]. 08/24/2019 1:30 PM Medical Record [id number] Patient Account Number: [id number] Date of Birth/Sex: Treating RN: 12/05/1950 (69 y.o. [name] Primary Care Oshae Simmering: [name] Other Clinician: Referring Makailey Hodgkin: Treating Lyrica Mcclarty/Extender:Robson, [name], [name] in Treatment: 3 Wound Status Wound Number: 7 Primary Lymphedema Etiology: Wound Location: Left Toe - Web between 2nd and 3rd Wound Healed - Epithelialized Status: Wounding Event: Blister Comorbid Chronic sinus problems/congestion, Date Acquired: 07/23/2019 History: Lymphedema, Hypertension, Rheumatoid Weeks Of Treatment: 3 Arthritis, Neuropathy Clustered Wound: No Photos Wound Measurements Length: (cm) 0 % Reductio Width: (cm) 0 % Reductio Depth: (cm) 0 Epithelial Area: (cm) 0 Tunneling Volume: (cm) 0 Undermini Wound Description Classification: Partial Thickness Wound Margin: Distinct, outline attached Exudate Amount: None Present Wound Bed Granulation Amount: None Present (0%) Necrotic Amount: None Present (0%) Foul Odor After Cleansing: No Slough/Fibrino No Exposed Structure Fascia Exposed: No Fat Layer (Subcutaneous Tissue) Exposed: No Tendon Exposed: No Muscle Exposed: No Joint Exposed: No Bone Exposed: No n in Area: 100% n in Volume: 100% ization: Large (67-100%) : No ng: No Electronic Signature(s) Signed: 08/25/2019 3:43:47 PM By: Mikeal Hawthorne EMT/HBOT Signed: 08/25/2019 6:06:10 PM By: Baruch Gouty RN, BSN Previous Signature: 08/24/2019 5:52:47 PM Version By: Baruch Gouty RN, BSN Previous Signature: 08/24/2019 5:52:47 PM Version By: Baruch Gouty RN, BSN Entered By: Mikeal Hawthorne on 08/25/2019 09:37:59 -------------------------------------------------------------------------------- Wound  Assessment Details Patient Name: Date of Service: [name]. 08/24/2019 1:30 PM Medical Record [name]:[id number] Patient Account Number: [id number] Date of Birth/Sex: Treating RN: May 06, 1951 (69 y.o. [name] Primary Care Hurshell Dino: [name] Other Clinician: Referring Alette Kataoka: Treating Lyndzee Kliebert/Extender:Robson, [name], [name] in Treatment: 3 Wound Status Wound Number: 8 Primary Lymphedema Etiology: Wound Location: Left Toe - Web between 3rd and 4th Wound Healed - Epithelialized Status: Wounding Event: Blister Comorbid Chronic sinus problems/congestion, Date Acquired: 07/23/2019 History: Lymphedema, Hypertension, Rheumatoid Weeks Of Treatment: 3 Arthritis, Neuropathy Clustered Wound: No Photos Wound Measurements Length: (cm) 0 % Reductio Width: (cm) 0 % Reductio Depth: (cm) 0 Epithelial Area: (cm) 0 Tunneling Volume: (cm) 0 Undermini Wound Description Classification: Partial Thickness Foul Odor Wound Margin: Distinct, outline attached Slough/Fi Exudate Amount: None Present Wound Bed Granulation Amount: None Present (0%) Necrotic Amount: None Present (0%) Fascia Ex Fat Layer Tendon Ex Muscle Ex Joint Exp Bone Expo Electronic Signature(s) Signed: 08/25/2019 3:43:47 PM By: Mikeal Hawthorne EMT/HBOT Signed: 08/25/2019 6:06:10 PM By: Baruch Gouty RN, BSN Previous Signature: 08/24/2019 5:52:47 PM Version By: Lourdes Sledge Entered ByMikeal Hawthorne on 01/06 After Cleansing: No brino No Exposed Structure posed: No (Subcutaneous Tissue) Exposed: No posed: No posed: No osed: No sed: No da RN, BSN /2021 09:37:36 n in Area: 100% n in Volume: 100% ization: Large (67-100%) : No ng: No -------------------------------------------------------------------------------- Wound Assessment Details Patient Name: Date of Service: [name], [name]. 08/24/2019 1:30 PM Medical Record [id number] Patient Account Number: [id number] Date  of Birth/Sex: Treating RN: 14-Jan-1951 (69 y.o. [name] Primary Care Aerion Bagdasarian: [name] Other Clinician: Referring Torez Beauregard: Treating Itxel Wickard/Extender:Robson, [name], [name] in Treatment: 3 Wound Status Wound Number: 9 Primary Lymphedema Etiology: Wound Location: Left Toe - Web between 4th and 5th Wound Healed - Epithelialized Status: Wounding Event: Blister Comorbid Chronic sinus problems/congestion, Date Acquired: 07/23/2019 History: Lymphedema, Hypertension, Rheumatoid Weeks Of Treatment: 3 Arthritis, Neuropathy Clustered Wound: No Photos Wound Measurements Length: (cm) 0 % Reductio Width: (cm) 0 % Reductio Depth: (cm) 0 Epithelial Area: (  cm) 0 Tunneling Volume: (cm) 0 Undermini Wound Description Classification: Partial Thickness Foul Odor Wound Margin: Distinct, outline attached Slough/Fi Exudate Amount: None Present Wound Bed Granulation Amount: None Present (0%) Necrotic Amount: None Present (0%) Fascia Ex Fat Layer Tendon Ex Muscle Joint E Bone Ex After Cleansing: No brino No Exposed Structure posed: No (Subcutaneous Tissue) Exposed: No posed: No Exposed: No xposed: No posed: No n in Area: 100% n in Volume: 100% ization: Large (67-100%) : No ng: No Electronic Signature(s) Signed: 08/25/2019 3:43:47 PM By: Mikeal Hawthorne EMT/HBOT Signed: 08/25/2019 6:06:10 PM By: Baruch Gouty RN, BSN Previous Signature: 08/24/2019 5:52:47 PM Version By: Baruch Gouty RN, BSN Entered By: Mikeal Hawthorne on 08/25/2019 09:37:12 -------------------------------------------------------------------------------- Vitals Details Patient Name: Date of Service: [name]. 08/24/2019 1:30 PM Medical Record [name]:[id number] Patient Account Number: [id number] Date of Birth/Sex: Treating RN: May 12, 1951 (69 y.o. [name] Primary Care Annelies Coyt: [name] Other Clinician: Referring Rickelle Sylvestre: Treating  Katheryne Gorr/Extender:Robson, [name], [name] in Treatment: 3 Vital Signs Time Taken: 14:33 Temperature (F): 98.2 Height (in): 66 Pulse (bpm): 64 Source: Stated Respiratory Rate (breaths/min): 18 Weight (lbs): 260 Blood Pressure (mmHg): 157/67 Source: Stated Reference Range: 80 - 120 mg / dl Body Mass Index (BMI): 42 Electronic Signature(s) Signed: 08/24/2019 5:52:47 PM By: Baruch Gouty RN, BSN Entered By: Baruch Gouty on 08/24/2019 14:34:18

## 2019-08-24 NOTE — Progress Notes (Signed)
SENAI, LEBOVITS ([id number]) Visit Report for 08/24/2019 HPI Details Patient Name: Date of Service: [name], [name]. 08/24/2019 1:30 PM Medical Record [id number] Patient Account Number: [id number] Date of Birth/Sex: Treating RN: [birth year] (69 y.o. F) Primary Care Provider: Micheline [name] Other Clinician: Referring Provider: Treating Provider/Extender:[name], [name], [name] in Treatment: 3 History of Present Illness HPI Description: Admission 08/03/2019 This is a 69 year old woman who tells me that somewhere just over 2 years ago she developed shingles on her lower back. Ever since then she has had symmetric bilateral lower extremity redness swelling and pain involving predominantly her feet but extending up her calfs to just below the knees. She says the pain and burning is severe and she relieves this by immersing her feet in cold water up to 7 times a day. She does not get any relief by any other mechanism. She also has lymphedema and unfortunately cannot tolerate compression hose. She has been to the lymphedema clinic in [place] on 1 occasion. She does however have compression pumps at home that she [place] but she does not use these. Recently she has had primary doctor appointments for redness of the ankles with blisters. She received a course of doxycycline with some effect. She is felt to have erythromelag and is recently been started on an adult strength aspirin 325. She also received a course of Bactrim.ia The patient has had arterial studies done in 2018 at that point she had a ABI of 1.16 on the right and 1.188 on the left. TBI's of 0.79 on the right and 0.77 on the left. Her waveforms were triphasic. More recently on July 20, 2019 she had just ABIs done that showed a ABI in the right of 0.98 and on the left at 0.99. She did not have TBI's however her waveforms were monophasic. I am told that she went to see vascular surgery at  1 point although I could not put my finger on the note. Past medical history; notable for rheumatoid arthritis on Plaquenil and sulfasalazine, lymphedema, neuropathy on gabapentin, lumbar stenosis status post surgery in August, primary hyperparathyroidism undergoing a left superior parathyroid orchidectomy earlier this year. 12/22; patient is a bit of an enigma. She has excruciating pain. Lymphedema, neuropathy, denuded skin between her toes which I wonder it might be tinea pedis. She has been previously diagnosed with erythromelalgia. She is on Neurontin. Gets relief by soaking her feet in ice water multiple times a day which I have tried to get her to stop doing. I have also had her use the compression pump she had at home to see if we can get some of the edema out of her legs. With regards to her toes we are separating the toes with silver alginate applying ketoconazole cream. I gave her 7 days of Diflucan although this interacts with Plaquenil. I am going to try to change her to terbinafine. She is using her compression pumps twice a day I think her pain may be some combination of the severe excoriation between her toes, peripheral neuropathy. 08/24/2019. The patient feels a lot better today. All the wounds in the interdigital spaces of her toes with her completely denuded skin are fully epithelialized. I think this was all tinea pedis. Erythema and swelling of her forefoot is also a lot better. She no longer has to soak her feet multiple times a day and ice water which I actually think was contributing to her symptomatology. She still has bilateral lymphedema with some degree of  stasis dermatitis. Still 3 small wounds on the dorsal left first second and third right toes. Electronic Signature(s) Signed: 08/24/2019 5:30:44 PM By: Christine Ham MD Entered By: [name] on 08/24/2019 15:27:52 -------------------------------------------------------------------------------- Physical Exam  Details Patient Name: Date of Service: [name]. 08/24/2019 1:30 PM Medical Record [id number] Patient Account Number: [id number] Date of Birth/Sex: Treating RN: 08-06-1951 (69 y.o. F) Primary Care Provider: Micheline [name] Other Clinician: Referring Provider: Treating Provider/Extender:[name], [name], [name] in Treatment: 3 Constitutional Patient is hypertensive.. Pulse regular and within target range for patient.Marland Kitchen Respirations regular, non-labored and within target range.. Temperature is normal and within the target range for the patient.Marland Kitchen Appears in no distress. Cardiovascular Pedal pulses palpable and strong bilaterally.. Notes Wound exam; patient's anterior tibia area on the right and left are much less erythematous. There is still what I think to be lymphedema with stasis dermatitis. All of her areas between the toes are fully epithelialized. She still has small areas on the dorsal second and first on the left and third on the right. Electronic Signature(s) Signed: 08/24/2019 5:30:44 PM By: Christine Ham MD Entered By: [name] on 08/24/2019 15:30:38 -------------------------------------------------------------------------------- Physician Orders Details Patient Name: Date of Service: [name]. 08/24/2019 1:30 PM Medical Record [id number] Patient Account Number: [id number] Date of Birth/Sex: Treating RN: 1950/12/17 (69 y.o. 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 Dressing Change Frequency Wound #10 Left,Medial Toe Great Change dressing every day. Wound #11 Left,Anterior Toe Second Change dressing every day. Wound #13 Right Toe Third Change dressing every day. Wound Cleansing Wound #10 Left,Medial Toe Great May shower and wash wound with soap and water. Wound #11 Left,Anterior Toe Second May shower and wash wound with soap and water. Wound #13 Right Toe Third May shower and wash wound with soap and water. Primary Wound Dressing Wound #10 Left,Medial Toe Great Calcium Alginate with Silver Other: - ketoconazole Wound #11 Left,Anterior Toe Second Calcium Alginate with Silver Other: - ketoconazole Wound #13 Right Toe Third Calcium Alginate with Silver Other: - ketoconazole Secondary Dressing Wound #10 Left,Medial Toe Great Kerlix/Rolled Gauze Dry Gauze Wound #11 Left,Anterior Toe Second Kerlix/Rolled Gauze Dry Gauze Wound #13 Right Toe Third Kerlix/Rolled Gauze Dry Gauze Electronic Signature(s) Signed: 08/24/2019 5:08:58 PM By: Carlene Coria RN Signed: 08/24/2019 5:30:44 PM By: Christine Ham MD Entered By: Carlene Coria on 08/24/2019 15:23:40 -------------------------------------------------------------------------------- Problem List Details Patient Name: Date of Service: [name]. 08/24/2019 1:30 PM Medical Record [id number] Patient Account Number: [id number] Date of Birth/Sex: Treating RN: 1950/09/02 (69 y.o. [name] Primary Care Provider: Micheline [name] Other Clinician: Referring Provider: Treating Provider/Extender:Danyia Borunda, [name], [name] in Treatment: 3 Active Problems ICD-10 Evaluated Encounter Code Description Active Date Today Diagnosis I89.0 Lymphedema, not elsewhere classified 08/03/2019 No Yes L97.511 Non-pressure chronic ulcer of other part of right foot 08/03/2019 No Yes limited to breakdown of skin L97.521  Non-pressure chronic ulcer of other part of left foot 08/03/2019 No Yes limited to breakdown of skin B35.3 Tinea pedis 08/03/2019 No Yes Inactive Problems Resolved Problems Electronic Signature(s) Signed: 08/24/2019 5:30:44 PM By: Christine Ham MD  Entered By: [name] on 08/24/2019 15:25:32 -------------------------------------------------------------------------------- Progress Note Details Patient Name: Date of Service: [name], [name]. 08/24/2019 1:30 PM Medical Record [id number] Patient Account Number: [id number] Date of Birth/Sex: Treating RN: Oct 02, 1950 (69 y.o. F) Primary Care Provider: Micheline [name] Other Clinician: Referring Provider: Treating Provider/Extender:Jemmie Rhinehart, [name], [name] in Treatment: 3 Subjective History of Present Illness (HPI) Admission 08/03/2019 This is a 69 year old woman who tells me that somewhere just over 2 years ago she developed shingles on her lower back. Ever since then she has had symmetric bilateral lower extremity redness swelling and pain involving predominantly her feet but extending up her calfs to just below the knees. She says the pain and burning is severe and she relieves this by immersing her feet in cold water up to 7 times a day. She does not get any relief by any other mechanism. She also has lymphedema and unfortunately cannot tolerate compression hose. She has been to the lymphedema clinic in [place] on 1 occasion. She does however have compression pumps at home that she [place] but she does not use these. Recently she has had primary doctor appointments for redness of the ankles with blisters. She received a course of doxycycline with some effect. She is felt to have erythromelag and is recently been started on an adult strength aspirin 325. She also received a course of Bactrim.ia The patient has had arterial studies done in 2018 at that point she had a ABI of 1.16 on the  right and 1.188 on the left. TBI's of 0.79 on the right and 0.77 on the left. Her waveforms were triphasic. More recently on July 20, 2019 she had just ABIs done that showed a ABI in the right of 0.98 and on the left at 0.99. She did not have TBI's however her waveforms were monophasic. I am told that she went to see vascular surgery at 1 point although I could not put my finger on the note. Past medical history; notable for rheumatoid arthritis on Plaquenil and sulfasalazine, lymphedema, neuropathy on gabapentin, lumbar stenosis status post surgery in August, primary hyperparathyroidism undergoing a left superior parathyroid orchidectomy earlier this year. 12/22; patient is a bit of an enigma. She has excruciating pain. Lymphedema, neuropathy, denuded skin between her toes which I wonder it might be tinea pedis. She has been previously diagnosed with erythromelalgia. She is on Neurontin. Gets relief by soaking her feet in ice water multiple times a day which I have tried to get her to stop doing. I have also had her use the compression pump she had at home to see if we can get some of the edema out of her legs. With regards to her toes we are separating the toes with silver alginate applying ketoconazole cream. I gave her 7 days of Diflucan although this interacts with Plaquenil. I am going to try to change her to terbinafine. She is using her compression pumps twice a day I think her pain may be some combination of the severe excoriation between her toes, peripheral neuropathy. 08/24/2019. The patient feels a lot better today. All the wounds in the interdigital spaces of her toes with her completely denuded skin are fully epithelialized. I think this was all tinea pedis. Erythema and swelling of her forefoot is also a lot better. She no longer has to soak her feet multiple times a day and ice water which I actually think was contributing to her symptomatology. She still has bilateral  lymphedema with some  degree of stasis dermatitis. Still 3 small wounds on the dorsal left first second and third right toes. Objective Constitutional Patient is hypertensive.. Pulse regular and within target range for patient.Marland Kitchen Respirations regular, non-labored and within target range.. Temperature is normal and within the target range for the patient.Marland Kitchen Appears in no distress. Vitals Time Taken: 2:33 PM, Height: 66 in, Source: Stated, Weight: 260 lbs, Source: Stated, BMI: 42, Temperature: 98.2 F, Pulse: 64 bpm, Respiratory Rate: 18 breaths/min, Blood Pressure: 157/67 mmHg. Cardiovascular Pedal pulses palpable and strong bilaterally.. General Notes: Wound exam; patient's anterior tibia area on the right and left are much less erythematous. There is still what I think to be lymphedema with stasis dermatitis. All of her areas between the toes are fully epithelialized. She still has small areas on the dorsal second and first on the left and third on the right. Integumentary (Hair, Skin) Wound #1 status is Healed - Epithelialized. Original cause of wound was Blister. The wound is located on the Right Toe - Web between 1st and 2nd. The wound measures 0cm length x 0cm width x 0cm depth; 0cm^2 area and 0cm^3 volume. There is no tunneling or undermining noted. There is a none present amount of drainage noted. The wound margin is distinct with the outline attached to the wound base. There is no granulation within the wound bed. There is no necrotic tissue within the wound bed. Wound #10 status is Open. Original cause of wound was Blister. The wound is located on the [employer]. The wound measures 0.1cm length x 0.1cm width x 0.1cm depth; 0.008cm^2 area and 0.001cm^3 volume. There is Fat Layer (Subcutaneous Tissue) Exposed exposed. There is no tunneling or undermining noted. There is a small amount of serosanguineous drainage noted. The wound margin is distinct with the outline attached to  the wound base. There is large (67-100%) pink granulation within the wound bed. There is no necrotic tissue within the wound bed. Wound #11 status is Open. Original cause of wound was Blister. The wound is located on the Left,Anterior Toe Second. The wound measures 0.4cm length x 0.5cm width x 0.1cm depth; 0.157cm^2 area and 0.016cm^3 volume. There is Fat Layer (Subcutaneous Tissue) Exposed exposed. There is no tunneling or undermining noted. There is a none present amount of drainage noted. The wound margin is distinct with the outline attached to the wound base. There is no granulation within the wound bed. There is a large (67-100%) amount of necrotic tissue within the wound bed including Adherent Slough. Wound #13 status is Open. Original cause of wound was Gradually Appeared. The wound is located on the Right Toe Third. The wound measures 0.1cm length x 0.1cm width x 0.1cm depth; 0.008cm^2 area and 0.001cm^3 volume. The wound is limited to skin breakdown. There is no tunneling or undermining noted. There is a none present amount of drainage noted. The wound margin is distinct with the outline attached to the wound base. There is small (1-33%) pink granulation within the wound bed. There is no necrotic tissue within the wound bed. Wound #2 status is Healed - Epithelialized. Original cause of wound was Blister. The wound is located on the Right Toe - Web between 2nd and 3rd. The wound measures 0cm length x 0cm width x 0cm depth; 0cm^2 area and 0cm^3 volume. There is no tunneling or undermining noted. There is a none present amount of drainage noted. The wound margin is distinct with the outline attached to the wound base. There is no granulation within  the wound bed. There is no necrotic tissue within the wound bed. Wound #3 status is Healed - Epithelialized. Original cause of wound was Blister. The wound is located on the Right Toe - Web between 3rd and 4th. The wound measures 0cm length x  0cm width x 0cm depth; 0cm^2 area and 0cm^3 volume. There is no tunneling or undermining noted. There is a none present amount of drainage noted. The wound margin is distinct with the outline attached to the wound base. There is no granulation within the wound bed. There is no necrotic tissue within the wound bed. Wound #4 status is Healed - Epithelialized. Original cause of wound was Blister. The wound is located on the Right Toe - Web between 4th and 5th. The wound measures 0cm length x 0cm width x 0cm depth; 0cm^2 area and 0cm^3 volume. There is no tunneling or undermining noted. There is a none present amount of drainage noted. The wound margin is distinct with the outline attached to the wound base. There is no granulation within the wound bed. There is no necrotic tissue within the wound bed. Wound #5 status is Healed - Epithelialized. Original cause of wound was Blister. The wound is located on the Right,Posterior Ankle. The wound measures 0cm length x 0cm width x 0cm depth; 0cm^2 area and 0cm^3 volume. There is no tunneling or undermining noted. There is a none present amount of drainage noted. The wound margin is distinct with the outline attached to the wound base. There is no granulation within the wound bed. There is no necrotic tissue within the wound bed. Wound #6 status is Healed - Epithelialized. Original cause of wound was Blister. The wound is located on the Left Toe - Web between 1st and 2nd. The wound measures 0cm length x 0cm width x 0cm depth; 0cm^2 area and 0cm^3 volume. There is no tunneling or undermining noted. There is a none present amount of drainage noted. The wound margin is distinct with the outline attached to the wound base. There is no granulation within the wound bed. There is no necrotic tissue within the wound bed. Wound #7 status is Healed - Epithelialized. Original cause of wound was Blister. The wound is located on the Left Toe - Web between 2nd and 3rd.  The wound measures 0cm length x 0cm width x 0cm depth; 0cm^2 area and 0cm^3 volume. There is no tunneling or undermining noted. There is a none present amount of drainage noted. The wound margin is distinct with the outline attached to the wound base. There is no granulation within the wound bed. There is no necrotic tissue within the wound bed. Wound #8 status is Healed - Epithelialized. Original cause of wound was Blister. The wound is located on the Left Toe - Web between 3rd and 4th. The wound measures 0cm length x 0cm width x 0cm depth; 0cm^2 area and 0cm^3 volume. There is no tunneling or undermining noted. There is a none present amount of drainage noted. The wound margin is distinct with the outline attached to the wound base. There is no granulation within the wound bed. There is no necrotic tissue within the wound bed. Wound #9 status is Open. Original cause of wound was Blister. The wound is located on the Left Toe - Web between 4th and 5th. The wound measures 0cm length x 0cm width x 0cm depth; 0cm^2 area and 0cm^3 volume. There is no tunneling or undermining noted. There is a none present amount of drainage noted. The  wound margin is distinct with the outline attached to the wound base. There is no granulation within the wound bed. There is no necrotic tissue within the wound bed. 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 Dressing Change Frequency: Wound #10 Left,Medial Toe Great: Change dressing every day. Wound #11 Left,Anterior Toe Second: Change dressing every day. Wound #13 Right Toe Third: Change dressing every day. Wound Cleansing: Wound #10 Left,Medial Toe Great: May shower and wash wound with soap  and water. Wound #11 Left,Anterior Toe Second: May shower and wash wound with soap and water. Wound #13 Right Toe Third: May shower and wash wound with soap and water. Primary Wound Dressing: Wound #10 Left,Medial Toe Great: Calcium Alginate with Silver Other: - ketoconazole Wound #11 Left,Anterior Toe Second: Calcium Alginate with Silver Other: - ketoconazole Wound #13 Right Toe Third: Calcium Alginate with Silver Other: - ketoconazole Secondary Dressing: Wound #10 Left,Medial Toe Great: Kerlix/Rolled Gauze Dry Gauze Wound #11 Left,Anterior Toe Second: Kerlix/Rolled Gauze Dry Gauze Wound #13 Right Toe Third: Kerlix/Rolled Gauze Dry Gauze #1 continue silver alginate to the small wounds on the 3 toes as described 2. I think this patient had severe tinea pedis contributing to pain sensation along with her peripheral neuropathy. This is a lot better. 3. Bilateral lymphedema with stasis dermatitis. She cannot tolerate compression stockings. She has compression pumps that she bought on [employer] and I have encouraged her to use these twice a day indefinitely. Electronic Signature(s) Signed: 08/24/2019 5:30:44 PM By: Christine Ham MD Entered By: [name] on 08/24/2019 15:32:19 -------------------------------------------------------------------------------- SuperBill Details Patient Name: Date of Service: [name] 08/24/2019 Medical Record [id number] Patient Account Number: [id number] Date of Birth/Sex: Treating RN: 1950-09-29 (69 y.o. [name] Primary Care Provider: Micheline [name] Other Clinician: Referring Provider: Treating Provider/Extender:Chayse Zatarain, [name], [name] in Treatment: 3 Diagnosis Coding ICD-10 Codes Code Description I89.0 Lymphedema, not elsewhere classified L97.511 Non-pressure chronic ulcer of other part of right foot limited to breakdown of skin L97.521 Non-pressure chronic ulcer of other part of left foot limited  to breakdown of skin B35.3 Tinea pedis Facility Procedures CPT4 Code: [id number] Description: 99214 - WOUND CARE VISIT-LEV 4 EST PT Modifier: Quantity: 1 Physician Procedures CPT4 Code Description: [id number] [facility] - WC PHYS LEVEL 3 - EST PT ICD-10 Diagnosis Description I89.0 Lymphedema, not elsewhere classified L97.511 Non-pressure chronic ulcer of other part of right foot L97.521 Non-pressure chronic ulcer of other part of  left foot l B35.3 Tinea pedis Modifier: limited to breakd imited to breakdo Quantity: 1 own of skin wn of skin Electronic Signature(s) Signed: 08/24/2019 5:30:44 PM By: Christine Ham MD Entered By: [name] on 08/24/2019 15:32:38

## 2019-08-30 ENCOUNTER — Other Ambulatory Visit: Payer: Self-pay | Admitting: Neurology

## 2019-08-30 ENCOUNTER — Telehealth: Payer: Self-pay | Admitting: Neurology

## 2019-08-30 MED ORDER — GABAPENTIN 100 MG PO CAPS: 200.0000 mg | ORAL_CAPSULE | Freq: Two times a day (BID) | ORAL | 1 refills | Status: DC

## 2019-08-30 MED FILL — LOSARTAN POTASSIUM 100 MG T: 100 | 30 days supply | Qty: 30 | Fill #5

## 2019-08-30 MED FILL — GABAPENTIN 100 MG CAPSULE: 100 | 30 days supply | Qty: 120 | Fill #0

## 2019-08-30 MED FILL — FUROSEMIDE 20 MG TABS: 20 | 6 days supply | Qty: 40 | Fill #2

## 2019-08-30 NOTE — Telephone Encounter (Signed)
Mychart message sent.

## 2019-08-30 NOTE — Telephone Encounter (Signed)
Please inform patient that she can try to reduce gabapentin to 200mg  twice daily - new prescription for 100mg  tab has been sent, as requested.

## 2019-08-30 NOTE — Telephone Encounter (Signed)
Please advise on below  

## 2019-08-30 NOTE — Telephone Encounter (Signed)
Patient was told that she needed to make appt for a refill gabapentin. She is out of her medication. She was last seen on 05-19-19.   She made a virtual appt on 09-20-19 to see Dr Posey Pronto. Wants to know if we can call her in enough to get her to the appt on 09-20-2019.  She would like to have [place] call in the Gabapentin 100 mg and not the 300 mg. She would like to try to wean her self off of the medication if Posey Pronto will agree with that

## 2019-09-02 ENCOUNTER — Other Ambulatory Visit: Payer: Self-pay | Admitting: *Deleted

## 2019-09-02 DIAGNOSIS — R600 Localized edema: Secondary | ICD-10-CM

## 2019-09-02 MED ORDER — FUROSEMIDE 20 MG PO TABS: 40.0000 mg | ORAL_TABLET | Freq: Two times a day (BID) | ORAL | 1 refills | Status: DC | PRN

## 2019-09-02 NOTE — Telephone Encounter (Signed)
Rx done. 

## 2019-09-03 MED FILL — FUROSEMIDE 20 MG TABS: 20 | 34 days supply | Qty: 200 | Fill #0

## 2019-09-07 ENCOUNTER — Other Ambulatory Visit: Payer: Self-pay

## 2019-09-07 ENCOUNTER — Encounter (HOSPITAL_BASED_OUTPATIENT_CLINIC_OR_DEPARTMENT_OTHER): Payer: 59 | Attending: Internal Medicine | Admitting: Internal Medicine

## 2019-09-07 DIAGNOSIS — S91105A Unspecified open wound of left lesser toe(s) without damage to nail, initial encounter: Secondary | ICD-10-CM | POA: Diagnosis not present

## 2019-09-07 DIAGNOSIS — L97521 Non-pressure chronic ulcer of other part of left foot limited to breakdown of skin: Secondary | ICD-10-CM | POA: Diagnosis not present

## 2019-09-07 DIAGNOSIS — B358 Other dermatophytoses: Secondary | ICD-10-CM | POA: Diagnosis not present

## 2019-09-07 DIAGNOSIS — I890 Lymphedema, not elsewhere classified: Secondary | ICD-10-CM | POA: Diagnosis not present

## 2019-09-07 DIAGNOSIS — L97511 Non-pressure chronic ulcer of other part of right foot limited to breakdown of skin: Secondary | ICD-10-CM | POA: Diagnosis not present

## 2019-09-08 NOTE — Progress Notes (Addendum)
Christine, [name] ([id number]) Visit Report for 09/07/2019 Arrival Information Details Patient Name: Date of Service: [name], [name]. 09/07/2019 1:30 PM Medical Record Number: [id number] Patient Account Number: [id number] Date of Birth/Sex: Treating RN: 07-29-1951 (69 y.o. Helene Shoe, Meta.Reding Primary Care Lacharles Altschuler: [name] Other Clinician: Referring Kyisha Fowle: Treating Abdulaziz Toman/Extender: Mancel Bale in Treatment: 5 Visit Information History Since Last Visit Added or deleted any medications: No Patient Arrived: Ambulatory Any new allergies or adverse reactions: No Arrival Time: 13:40 Had a fall or experienced change in No Accompanied By: self activities of daily living that may affect Transfer Assistance: None risk of falls: Patient Requires Transmission-Based Precautions: No Signs or symptoms of abuse/neglect since last visito No Patient Has Alerts: Yes Hospitalized since last visit: No Patient Alerts: ABI: L 0.99 R0.98 12/15 Implantable device outside of the clinic excluding No cellular tissue based products placed in the center since last visit: Has Dressing in Place as Prescribed: Yes Pain Present Now: No Electronic Signature(s) Signed: 09/07/2019 5:39:51 PM By: Deon Pilling Entered By: Deon Pilling on 09/07/2019 13:51:04 -------------------------------------------------------------------------------- Encounter Discharge Information Details Patient Name: Date of Service: [name]. 09/07/2019 1:30 PM Medical Record Number: [id number] Patient Account Number: [id number] Date of Birth/Sex: Treating RN: 1951/04/30 (69 y.o. Clearnce Sorrel Primary Care Eldean Klatt: [name] Other Clinician: Referring Kaydee Magel: Treating Jaleeya Mcnelly/Extender: Mancel Bale in Treatment: 5 Encounter Discharge Information Items Post Procedure Vitals Discharge Condition: Stable Temperature (F): 98.6 Ambulatory  Status: Ambulatory Pulse (bpm): 71 Discharge Destination: Home Respiratory Rate (breaths/min): 20 Transportation: Private Auto Blood Pressure (mmHg): 173/77 Accompanied By: self Schedule Follow-up Appointment: Yes Clinical Summary of Care: Patient Declined Electronic Signature(s) Signed: 09/07/2019 5:45:51 PM By: Kela Millin Entered By: Kela Millin on 09/07/2019 14:26:26 -------------------------------------------------------------------------------- Lower Extremity Assessment Details Patient Name: Date of Service: [name]. 09/07/2019 1:30 PM Medical Record Number: [id number] Patient Account Number: [id number] Date of Birth/Sex: Treating RN: 06/01/1951 (69 y.o. [name] Primary Care Margot Oriordan: [name] Other Clinician: Referring Perlie Stene: Treating Velmer Broadfoot/Extender: Mancel Bale in Treatment: 5 Edema Assessment Assessed: [Left: Yes] [Right: Yes] Edema: [Left: Yes] [Right: Yes] Calf Left: Right: Point of Measurement: 31 cm From Medial Instep 44 cm 43 cm Ankle Left: Right: Point of Measurement: 9 cm From Medial Instep 26 cm 26 cm Electronic Signature(s) Signed: 09/07/2019 5:39:51 PM By: Deon Pilling Entered By: Deon Pilling on 09/07/2019 13:52:13 -------------------------------------------------------------------------------- Multi Wound Chart Details Patient Name: Date of Service: [name]. 09/07/2019 1:30 PM Medical Record Number: [id number] Patient Account Number: [id number] Date of Birth/Sex: Treating RN: 05/24/1951 (69 y.o. F) Primary Care Taj Nevins: [name] Other Clinician: Referring Racquel Arkin: Treating Laporcha Marchesi/Extender: Mancel Bale in Treatment: 5 Vital Signs Height(in): 15 Pulse(bpm): 75 Weight(lbs): 260 Blood Pressure(mmHg): 173/77 Body Mass Index(BMI): 42 Temperature(F): 98.6 Respiratory Rate(breaths/min): 20 Photos: [10:No Photos Left,  Medial T Great oe] [11:No Photos Left T Second - Anterior oe] [13:No Photos Right T Third oe] Wound Location: [10:Blister] [11:Blister] [13:Gradually Appeared] Wounding Event: [10:Lymphedema] [11:Lymphedema] [13:Lymphedema] Primary Etiology: [10:N/A] [11:Chronic sinus problems/congestion,] [13:Chronic sinus problems/congestion,] Comorbid History: [10:07/23/2019] [11:Lymphedema, Hypertension, Rheumatoid Arthritis, Neuropathy 07/23/2019] [13:Lymphedema, Hypertension, Rheumatoid Arthritis, Neuropathy 08/10/2019] Date Acquired: [10:5] [11:5] [13:4] Weeks of Treatment: [10:Healed - Epithelialized] [11:Open] [13:Open] Wound Status: [10:0x0x0] [11:0.2x0.2x0.1] [13:0x0x0] Measurements L x W x D (cm) [10:0] [11:0.031] [13:0] A (cm) : rea [10:0] [11:0.003] [13:0] Volume (cm) : [10:100.00%] [11:93.00%] [13:100.00%] % Reduction in Area: [10:100.00%] [11:93.20%] [13:100.00%] % Reduction in Volume: [10:Full Thickness Without Exposed] [  11:Full Thickness Without Exposed] [13:Full Thickness Without Exposed] Classification: [10:Support Structures N/A] [11:Support Structures None Present] [13:Support Structures None Present] Exudate A mount: [10:N/A] [11:Distinct, outline attached] [13:Distinct, outline attached] Wound Margin: [10:N/A] [11:Large (67-100%)] [13:None Present (0%)] Granulation A mount: [10:N/A] [11:Red, Pink] [13:N/A] Granulation Quality: [10:N/A] [11:None Present (0%)] [13:None Present (0%)] Necrotic A mount: [10:N/A] [11:Large (67-100%)] [13:Large (67-100%)] Epithelialization: [10:N/A] [11:Debridement - Selective/Open Wound] [13:N/A] Debridement: [10:N/A] [id number] [13:N/A] Pre-procedure Verification/Time Out Taken: [10:N/A] [11:Lidocaine 5% topical ointment] [13:N/A] Pain Control: [10:N/A] [11:Skin/Epidermis] [13:N/A] Level: [10:N/A] [11:0.04] [13:N/A] Debridement A (sq cm): [10:rea N/A] [11:Curette] [13:N/A] Instrument: [10:N/A] [11:Moderate] [13:N/A] Bleeding: [10:N/A] [11:Pressure]  [13:N/A] Hemostasis A chieved: [10:N/A] [11:0] [13:N/A] Procedural Pain: [10:N/A] [11:0] [13:N/A] Post Procedural Pain: [10:N/A] [11:Procedure was tolerated well] [13:N/A] Debridement Treatment Response: [10:N/A] [11:0.2x0.2x0.1] [13:N/A] Post Debridement Measurements L x W x D (cm) [10:N/A] [11:0.003] [13:N/A] Post Debridement Volume: (cm) [10:N/A] [11:N/A] [13:fibrin/scabbed over.] Assessment Notes: [10:N/A] [11:Debridement] [13:N/A] Treatment Notes Electronic Signature(s) Signed: 09/07/2019 5:52:07 PM By: Linton Ham MD Entered By: Linton Ham on 09/07/2019 14:20:00 -------------------------------------------------------------------------------- Multi-Disciplinary Care Plan Details Patient Name: Date of Service: [name]. 09/07/2019 1:30 PM Medical Record Number: [id number] Patient Account Number: [id number] Date of Birth/Sex: Treating RN: 03-20-1951 (69 y.o. [name] Primary Care Jamael Hoffmann: [name] Other Clinician: Referring Zayden Maffei: Treating Aamari West/Extender: Mancel Bale in Treatment: 5 Active Inactive Electronic Signature(s) Signed: 02/15/2020 5:27:17 PM By: Carlene Coria RN Previous Signature: 09/08/2019 5:42:11 PM Version By: Carlene Coria RN Entered By: Carlene Coria on 10/04/2019 17:36:40 -------------------------------------------------------------------------------- Pain Assessment Details Patient Name: Date of Service: DEBROAH, ASHMAN 09/07/2019 1:30 PM Medical Record Number: [id number] Patient Account Number: [id number] Date of Birth/Sex: Treating RN: [birth year] (69 y.o. [name] Primary Care Phyllis Abelson: [name] Other Clinician: Referring Tamsin Nader: Treating Katalyna Socarras/Extender: Mancel Bale in Treatment: 5 Active Problems Location of Pain Severity and Description of Pain Patient Has Paino No Site Locations Rate the pain. Current Pain Level: 0 Pain  Management and Medication Current Pain Management: Medication: No Cold Application: No Rest: No Massage: No Activity: No T.E.N.S.: No Heat Application: No Leg drop or elevation: No Is the Current Pain Management Adequate: Adequate How does your wound impact your activities of daily livingo Sleep: No Bathing: No Appetite: No Relationship With Others: No Bladder Continence: No Emotions: No Bowel Continence: No Work: No Toileting: No Drive: No Dressing: No Hobbies: No Electronic Signature(s) Signed: 09/07/2019 5:39:51 PM By: Deon Pilling Entered By: Deon Pilling on 09/07/2019 13:51:57 -------------------------------------------------------------------------------- Patient/Caregiver Education Details Patient Name: Date of Service: [name] 1/19/2021andnbsp1:30 PM Medical Record Number: [id number] Patient Account Number: [id number] Date of Birth/Gender: Treating RN: 1950/10/25 (69 y.o. [name] Primary Care Physician: [name] Other Clinician: Referring Physician: Treating Physician/Extender: Mancel Bale in Treatment: 5 Education Assessment Education Provided To: Patient Education Topics Provided Wound/Skin Impairment: Methods: Explain/Verbal Responses: State content correctly Electronic Signature(s) Signed: 09/08/2019 5:42:11 PM By: Carlene Coria RN Entered By: Carlene Coria on 09/07/2019 14:09:41 -------------------------------------------------------------------------------- Wound Assessment Details Patient Name: Date of Service: BLANCH, BLOK. 09/07/2019 1:30 PM Medical Record Number: [id number] Patient Account Number: [id number] Date of Birth/Sex: Treating RN: 01/18/1951 (69 y.o. [name] Primary Care Kama Cammarano: [name] Other Clinician: Referring Lismary Kiehn: Treating Honey Zakarian/Extender: Mancel Bale in Treatment: 5 Wound Status Wound Number: 10 Primary  Etiology: Lymphedema Wound Location: Left, Medial T Great oe Wound Status: Healed - Epithelialized Wounding Event: Blister Date Acquired: 07/23/2019 Weeks Of Treatment: 5 Clustered Wound:  No Wound Measurements Length: (cm) Width: (cm) Depth: (cm) Area: (cm) Volume: (cm) 0 % Reduction in Area: 100% 0 % Reduction in Volume: 100% 0 0 0 Wound Description Classification: Full Thickness Without Exposed Support Structur es Electronic Signature(s) Signed: 09/07/2019 5:39:51 PM By: Deon Pilling Entered By: Deon Pilling on 09/07/2019 13:52:23 -------------------------------------------------------------------------------- Wound Assessment Details Patient Name: Date of Service: SKI, NORRED. 09/07/2019 1:30 PM Medical Record Number: [id number] Patient Account Number: [id number] Date of Birth/Sex: Treating RN: Mar 10, 1951 (69 y.o. Helene Shoe, Meta.Reding Primary Care Jamaine Quintin: [name] Other Clinician: Referring [name] Brothers: Treating Suzann Lazaro/Extender: Mancel Bale in Treatment: 5 Wound Status Wound Number: 11 Primary Lymphedema Etiology: Wound Location: Left T Second - Anterior oe Wound Open Wounding Event: Blister Status: Date Acquired: 07/23/2019 Comorbid Chronic sinus problems/congestion, Lymphedema, Hypertension, Weeks Of Treatment: 5 History: Rheumatoid Arthritis, Neuropathy Clustered Wound: No Wound Measurements Length: (cm) 0.2 Width: (cm) 0.2 Depth: (cm) 0.1 Area: (cm) 0.031 Volume: (cm) 0.003 Wound Description Classification: Full Thickness Without Exposed Support Structu Wound Margin: Distinct, outline attached Exudate Amount: None Present Foul Odor After Cleansing: Slough/Fibrino % Reduction in Area: 93% % Reduction in Volume: 93.2% Epithelialization: Large (67-100%) Tunneling: No Undermining: No res No Yes Wound Bed Granulation Amount: Large (67-100%) Exposed Structure Granulation Quality: Red, Pink Fascia  Exposed: No Necrotic Amount: None Present (0%) Fat Layer (Subcutaneous Tissue) Exposed: Yes Tendon Exposed: No Muscle Exposed: No Joint Exposed: No Bone Exposed: No Electronic Signature(s) Signed: 09/07/2019 5:39:51 PM By: Deon Pilling Entered By: Deon Pilling on 09/07/2019 13:52:47 -------------------------------------------------------------------------------- Wound Assessment Details Patient Name: Date of Service: JENNILEE, CULLOP. 09/07/2019 1:30 PM Medical Record Number: [id number] Patient Account Number: [id number] Date of Birth/Sex: Treating RN: 1951-05-17 (69 y.o. [name] Primary Care Byran Bilotti: [name] Other Clinician: Referring Ajay Strubel: Treating Maryclare Nydam/Extender: Mancel Bale in Treatment: 5 Wound Status Wound Number: 13 Primary Lymphedema Etiology: Wound Location: Right T Third oe Wound Healed - Epithelialized Wounding Event: Gradually Appeared Status: Date Acquired: 08/10/2019 Comorbid Chronic sinus problems/congestion, Lymphedema, Hypertension, Weeks Of Treatment: 4 History: Rheumatoid Arthritis, Neuropathy Clustered Wound: No Photos Wound Measurements Length: (cm) Width: (cm) Depth: (cm) Area: (cm) Volume: (cm) 0 % Reduction in Area: 100% 0 % Reduction in Volume: 100% 0 Epithelialization: Large (67-100%) 0 Tunneling: No 0 Undermining: No Wound Description Classification: Full Thickness Without Exposed Support Structures Wound Margin: Distinct, outline attached Exudate Amount: None Present Foul Odor After Cleansing: No Slough/Fibrino No Wound Bed Granulation Amount: None Present (0%) Exposed Structure Necrotic Amount: None Present (0%) Fascia Exposed: No Fat Layer (Subcutaneous Tissue) Exposed: No Tendon Exposed: No Muscle Exposed: No Joint Exposed: No Bone Exposed: No Limited to Skin Breakdown Assessment Notes fibrin/scabbed over. Electronic Signature(s) Signed: 09/10/2019 5:14:05 PM By:  Mikeal Hawthorne EMT/HBOT Signed: 09/13/2019 6:01:30 PM By: Carlene Coria RN Previous Signature: 09/08/2019 5:42:11 PM Version By: Carlene Coria RN Entered By: Mikeal Hawthorne on 09/10/2019 15:55:34 -------------------------------------------------------------------------------- Vitals Details Patient Name: Date of Service: [name]. 09/07/2019 1:30 PM Medical Record Number: [id number] Patient Account Number: [id number] Date of Birth/Sex: Treating RN: 1950/10/18 (69 y.o. Helene Shoe, Tammi Klippel Primary Care Shawnique Mariotti: [name] Other Clinician: Referring Dahlila Pfahler: Treating Garhett Bernhard/Extender: Mancel Bale in Treatment: 5 Vital Signs Time Taken: 13:45 Temperature (F): 98.6 Height (in): 66 Pulse (bpm): 71 Weight (lbs): 260 Respiratory Rate (breaths/min): 20 Body Mass Index (BMI): 42 Blood Pressure (mmHg): 173/77 Reference Range: 80 - 120 mg / dl Electronic Signature(s) Signed: 09/07/2019 5:39:51 PM By: Deon Pilling Entered By:  Deon Pilling on 09/07/2019 13:51:40

## 2019-09-08 NOTE — Progress Notes (Signed)
[name], [name] ([id number]) Visit Report for 09/07/2019 Debridement Details Patient Name: Date of Service: [name], [name]. 09/07/2019 1:30 PM Medical Record [id number] Patient Account Number: [id number] Date of Birth/Sex: Treating RN: [birth year] (69 y.o. F) Primary Care Provider: Micheline [name] Other Clinician: Referring Provider: Treating Provider/Extender:Vasiliy Mccarry, [name], [name] in Treatment: 5 Debridement Performed for Wound #11 Left,Anterior Toe Second Assessment: Performed By: Physician Ricard Dillon., MD Debridement Type: Debridement Level of Consciousness (Pre- Awake and Alert procedure): Pre-procedure Yes - 14:06 Verification/Time Out Taken: Start Time: 14:06 Pain Control: Lidocaine 5% topical ointment Total Area Debrided (L x W): 0.2 (cm) x 0.2 (cm) = 0.04 (cm) Tissue and other material Viable, Non-Viable, Skin: Dermis , Skin: Epidermis debrided: Level: Skin/Epidermis Debridement Description: Selective/Open Wound Instrument: Curette Bleeding: Moderate Hemostasis Achieved: Pressure End Time: 14:08 Procedural Pain: 0 Post Procedural Pain: 0 Response to Treatment: Procedure was tolerated well Level of Consciousness Awake and Alert (Post-procedure): Post Debridement Measurements of Total Wound Length: (cm) 0.2 Width: (cm) 0.2 Depth: (cm) 0.1 Volume: (cm) 0.003 Character of Wound/Ulcer Post Improved Debridement: Post Procedure Diagnosis Same as Pre-procedure Electronic Signature(s) Signed: 09/07/2019 5:52:07 PM By: Linton Ham MD Entered By: Linton Ham on 09/07/2019 14:20:12 -------------------------------------------------------------------------------- HPI Details Patient Name: Date of Service: [name]. 09/07/2019 1:30 PM Medical Record [id number] Patient Account Number: [id number] Date of Birth/Sex: Treating RN: 12/20/1950 (69 y.o. F) Primary Care Provider: Micheline [name] Other  Clinician: Referring Provider: Treating Provider/Extender:Mauricia Mertens, [name], [name] in Treatment: 5 History of Present Illness HPI Description: Admission 08/03/2019 This is a 69 year old woman who tells me that somewhere just over 2 years ago she developed shingles on her lower back. Ever since then she has had symmetric bilateral lower extremity redness swelling and pain involving predominantly her feet but extending up her calfs to just below the knees. She says the pain and burning is severe and she relieves this by immersing her feet in cold water up to 7 times a day. She does not get any relief by any other mechanism. She also has lymphedema and unfortunately cannot tolerate compression hose. She has been to the lymphedema clinic in [place] on 1 occasion. She does however have compression pumps at home that she [place] but she does not use these. Recently she has had primary doctor appointments for redness of the ankles with blisters. She received a course of doxycycline with some effect. She is felt to have erythromelag and is recently been started on an adult strength aspirin 325. She also received a course of Bactrim.ia The patient has had arterial studies done in 2018 at that point she had a ABI of 1.16 on the right and 1.188 on the left. TBI's of 0.79 on the right and 0.77 on the left. Her waveforms were triphasic. More recently on July 20, 2019 she had just ABIs done that showed a ABI in the right of 0.98 and on the left at 0.99. She did not have TBI's however her waveforms were monophasic. I am told that she went to see vascular surgery at 1 point although I could not put my finger on the note. Past medical history; notable for rheumatoid arthritis on Plaquenil and sulfasalazine, lymphedema, neuropathy on gabapentin, lumbar stenosis status post surgery in August, primary hyperparathyroidism undergoing a left superior parathyroid orchidectomy  earlier this year. 12/22; patient is a bit of an enigma. She has excruciating pain. Lymphedema, neuropathy, denuded skin between her toes which I wonder it might be tinea pedis.  She has been previously diagnosed with erythromelalgia. She is on Neurontin. Gets relief by soaking her feet in ice water multiple times a day which I have tried to get her to stop doing. I have also had her use the compression pump she had at home to see if we can get some of the edema out of her legs. With regards to her toes we are separating the toes with silver alginate applying ketoconazole cream. I gave her 7 days of Diflucan although this interacts with Plaquenil. I am going to try to change her to terbinafine. She is using her compression pumps twice a day I think her pain may be some combination of the severe excoriation between her toes, peripheral neuropathy. 08/24/2019. The patient feels a lot better today. All the wounds in the interdigital spaces of her toes with her completely denuded skin are fully epithelialized. I think this was all tinea pedis. Erythema and swelling of her forefoot is also a lot better. She no longer has to soak her feet multiple times a day and ice water which I actually think was contributing to her symptomatology. She still has bilateral lymphedema with some degree of stasis dermatitis. Still 3 small wounds on the dorsal left first second and third right toes. 1/19; patient continues to feel better. She has a constellation of problems in her lower extremities including lymphedema, idiopathic peripheral neuropathy, rheumatoid arthritis. When she came in she had absolutely no skin between the webspaces of any of her toes probably secondary to tinea pedis this is resolved. She finds her pain a lot better. Last week a hair spray can fell out of the cupboard and hit her on the top of her left second toe otherwise everything else is healed here. Electronic Signature(s) Signed: 09/07/2019  5:52:07 PM By: Linton Ham MD Entered By: Linton Ham on 09/07/2019 14:21:17 -------------------------------------------------------------------------------- Physical Exam Details Patient Name: Date of Service: [name]. 09/07/2019 1:30 PM Medical Record [id number] Patient Account Number: [id number] Date of Birth/Sex: Treating RN: 12/31/1950 (69 y.o. F) Primary Care Provider: Micheline [name] Other Clinician: Referring Provider: Treating Provider/Extender:Chayden Garrelts, [name], [name] in Treatment: 5 Constitutional Patient is hypertensive.. Pulse regular and within target range for patient.Marland Kitchen Respirations regular, non-labored and within target range.. Temperature is normal and within the target range for the patient.Marland Kitchen Appears in no distress. Cardiovascular Pedal pulses are palpable. Notes Wound exam; patient's anterior tibia wounds on the left and right are closed. She has decent edema control. All the areas between her toes are fully epithelialized. Areas on the right third and left first are healed. She does has the dorsal left second toe. This required debridement with a #3 curette to remove skin and callus small punched out open wound. Electronic Signature(s) Signed: 09/07/2019 5:52:07 PM By: Linton Ham MD Entered By: Linton Ham on 09/07/2019 14:22:38 -------------------------------------------------------------------------------- Physician Orders Details Patient Name: Date of Service: [name]. 09/07/2019 1:30 PM Medical Record [id number] Patient Account Number: [id number] Date of Birth/Sex: Treating RN: Sep 17, 1950 (69 y.o. 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 Dressing Change Frequency Change dressing  every day. Wound Cleansing May shower and wash wound with soap and water. Primary Wound Dressing Wound #11 Left,Anterior Toe Second Calcium Alginate with Silver Other: - ketoconazole Secondary Dressing Wound #11 Left,Anterior Toe Second Kerlix/Rolled Gauze Dry Gauze Electronic Signature(s) Signed: 09/07/2019 5:52:07 PM By: Linton Ham MD Signed: 09/08/2019 5:42:11 PM By: Carlene Coria RN Entered By: Carlene Coria on 09/07/2019 14:17:58 -------------------------------------------------------------------------------- Problem List Details Patient Name: Date of Service: [name]. 09/07/2019 1:30 PM Medical Record [id number] Patient Account Number: [id number] Date of Birth/Sex: Treating RN: 1950-11-14 (69 y.o. 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 09/07/2019 1:30 PM Medical Record [id number] Patient Account Number: [id number] Date of Birth/Sex: Treating RN: May 27, 1951 (69 y.o. F) Primary Care Provider: Micheline [name] Other Clinician: Referring Provider: Treating Provider/Extender:Moss Berry, [name], [name] in Treatment: 5 Subjective History of Present Illness (HPI) Admission 08/03/2019 This is a 69 year old woman who tells me that somewhere just over 2 years ago she developed shingles on her lower back. Ever since then she has had symmetric bilateral lower extremity redness swelling and pain involving predominantly her feet but extending up her calfs to just below the knees. She says the pain and burning is severe and she relieves this by immersing her feet in cold water up to 7 times a day. She does not get any relief by any other mechanism. She also has lymphedema and unfortunately cannot tolerate compression hose. She has been to the lymphedema clinic in [place] on 1 occasion. She does however have compression pumps at home that she [place] but she does not use these. Recently she has had primary doctor appointments for redness of the ankles with blisters. She received a course of doxycycline with some effect. She is felt to have erythromelag and is recently been started on an adult strength aspirin 325. She also received a course of Bactrim.ia The patient has had arterial studies done in 2018 at that point she had a ABI of 1.16 on the right and 1.188 on the left. TBI's of 0.79 on the right and 0.77 on the left. Her waveforms were triphasic. More recently on July 20, 2019 she had just ABIs done that showed a ABI in the right of 0.98 and on the left at 0.99. She did not have TBI's however her waveforms were monophasic. I am told that she went to see vascular surgery at 1 point although I could not put my finger on the note. Past medical  history; notable for rheumatoid arthritis on Plaquenil and sulfasalazine, lymphedema, neuropathy on gabapentin, lumbar stenosis status post surgery in August, primary hyperparathyroidism undergoing a left superior parathyroid orchidectomy earlier this year. 12/22; patient is a bit of an enigma. She has excruciating pain. Lymphedema, neuropathy, denuded skin between her toes which I wonder it might be tinea pedis. She has been previously diagnosed with erythromelalgia. She is on  Neurontin. Gets relief by soaking her feet in ice water multiple times a day which I have tried to get her to stop doing. I have also had her use the compression pump she had at home to see if we can get some of the edema out of her legs. With regards to her toes we are separating the toes with silver alginate applying ketoconazole cream. I gave her 7 days of Diflucan although this interacts with Plaquenil. I am going to try to change her to terbinafine. She is using her compression pumps twice a day I think her pain may be some combination of the severe excoriation between her toes, peripheral neuropathy. 08/24/2019. The patient feels a lot better today. All the wounds in the interdigital spaces of her toes with her completely denuded skin are fully epithelialized. I think this was all tinea pedis. Erythema and swelling of her forefoot is also a lot better. She no longer has to soak her feet multiple times a day and ice water which I actually think was contributing to her symptomatology. She still has bilateral lymphedema with some degree of stasis dermatitis. Still 3 small wounds on the dorsal left first second and third right toes. 1/19; patient continues to feel better. She has a constellation of problems in her lower extremities including lymphedema, idiopathic peripheral neuropathy, rheumatoid arthritis. When she came in she had absolutely no skin between the webspaces of any of her toes probably secondary to tinea  pedis this is resolved. She finds her pain a lot better. Last week a hair spray can fell out of the cupboard and hit her on the top of her left second toe otherwise everything else is healed here. Objective Constitutional Patient is hypertensive.. Pulse regular and within target range for patient.Marland Kitchen Respirations regular, non-labored and within target range.. Temperature is normal and within the target range for the patient.Marland Kitchen Appears in no distress. Vitals Time Taken: 1:45 PM, Height: 66 in, Weight: 260 lbs, BMI: 42, Temperature: 98.6 F, Pulse: 71 bpm, Respiratory Rate: 20 breaths/min, Blood Pressure: 173/77 mmHg. Cardiovascular Pedal pulses are palpable. General Notes: Wound exam; patient's anterior tibia wounds on the left and right are closed. She has decent edema control. All the areas between her toes are fully epithelialized. Areas on the right third and left first are healed. She does has the dorsal left second toe. This required debridement with a #3 curette to remove skin and callus small punched out open wound. Integumentary (Hair, Skin) Wound #10 status is Healed - Epithelialized. Original cause of wound was Blister. The wound is located on the [employer]. The wound measures 0cm length x 0cm width x 0cm depth; 0cm^2 area and 0cm^3 volume. Wound #11 status is Open. Original cause of wound was Blister. The wound is located on the Left,Anterior Toe Second. The wound measures 0.2cm length x 0.2cm width x 0.1cm depth; 0.031cm^2 area and 0.003cm^3 volume. There is Fat Layer (Subcutaneous Tissue) Exposed exposed. There is no tunneling or undermining noted. There is a none present amount of drainage noted. The wound margin is distinct with the outline attached to the wound base. There is large (67-100%) red, pink granulation within the wound bed. There is no necrotic tissue within the wound bed. Wound #13 status is Open. Original cause of wound was Gradually Appeared. The wound  is located on the Right Toe Third. The wound measures 0cm length x 0cm width x 0cm depth; 0cm^2 area and 0cm^3 volume. The wound is limited to  skin breakdown. There is no tunneling or undermining noted. There is a none present amount of drainage noted. The wound margin is distinct with the outline attached to the wound base. There is no granulation within the wound bed. There is no necrotic tissue within the wound bed. General Notes: fibrin/scabbed over. 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 There was a Selective/Open Wound Skin/Epidermis Debridement with a total area of 0.04 sq cm performed by Ricard Dillon., MD. With the following instrument(s): Curette to remove Viable and Non-Viable tissue/material. Material removed includes Skin: Dermis and Skin: Epidermis and after achieving pain control using Lidocaine 5% topical ointment. No specimens were taken. A time out was conducted at 14:06, prior to the start of the procedure. A Moderate amount of bleeding was controlled with Pressure. The procedure was tolerated well with a pain level of 0 throughout and a pain level of 0 following the procedure. Post Debridement Measurements: 0.2cm length x 0.2cm width x 0.1cm depth; 0.003cm^3 volume. Character of Wound/Ulcer Post Debridement is improved. Post procedure Diagnosis Wound #11: Same as Pre-Procedure Plan Follow-up Appointments: Return appointment in 1 month. Dressing Change Frequency: Change dressing every day. Wound Cleansing: May shower and wash wound with soap and water. Primary Wound Dressing: Wound #11 Left,Anterior Toe Second: Calcium Alginate with  Silver Other: - ketoconazole Secondary Dressing: Wound #11 Left,Anterior Toe Second: Kerlix/Rolled Gauze Dry Gauze 1. Silver alginate to the left anterior second toe. 2. Everything else is healed. 3. her edema is controlled. Electronic Signature(s) Signed: 09/07/2019 5:52:07 PM By: Linton Ham MD Entered By: Linton Ham on 09/07/2019 14:24:40 -------------------------------------------------------------------------------- SuperBill Details Patient Name: Date of Service: [name]. 09/07/2019 Medical Record [id number] Patient Account Number: [id number] Date of Birth/Sex: Treating RN: [birth year] (69 y.o. 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 of skin B35.3 Tinea pedis Facility Procedures CPT4 Code Description: [id number] - DEBRIDE WOUND 1ST 20 SQ CM OR < ICD-10 Diagnosis Description L97.521 Non-pressure chronic ulcer of other part of left foot limited Modifier: to breakdown Quantity: 1 of skin Physician Procedures CPT4 Code Description: [id number] - WC PHYS DEBR WO ANESTH 20 SQ CM ICD-10 Diagnosis Description L97.521 Non-pressure chronic ulcer of other part of left foot limited Modifier: to breakdown Quantity: 1 of skin Electronic Signature(s) Signed: 09/07/2019 5:52:07 PM By: Linton Ham MD Entered By: Linton Ham on 09/07/2019 14:25:05

## 2019-09-13 ENCOUNTER — Ambulatory Visit
Admission: RE | Admit: 2019-09-13 | Discharge: 2019-09-13 | Disposition: A | Discharge status: Home / Self Care | Payer: 59 | Source: Ambulatory Visit | Attending: Family Medicine | Admitting: Family Medicine

## 2019-09-13 ENCOUNTER — Other Ambulatory Visit: Payer: Self-pay

## 2019-09-13 DIAGNOSIS — I712 Thoracic aortic aneurysm, without rupture, unspecified: Secondary | ICD-10-CM

## 2019-09-13 DIAGNOSIS — N2889 Other specified disorders of kidney and ureter: Secondary | ICD-10-CM

## 2019-09-13 DIAGNOSIS — N281 Cyst of kidney, acquired: Secondary | ICD-10-CM | POA: Diagnosis not present

## 2019-09-13 DIAGNOSIS — K5711 Diverticulosis of small intestine without perforation or abscess with bleeding: Secondary | ICD-10-CM | POA: Diagnosis not present

## 2019-09-13 MED ORDER — GADOBENATE DIMEGLUMINE 529 MG/ML IV SOLN
20.0000 mL | Freq: Once | INTRAVENOUS | Status: AC | PRN
  Administered 2019-09-13: 20 mL via INTRAVENOUS

## 2019-09-15 ENCOUNTER — Other Ambulatory Visit: Payer: Self-pay | Admitting: Family Medicine

## 2019-09-16 ENCOUNTER — Telehealth: Payer: Self-pay | Admitting: *Deleted

## 2019-09-16 ENCOUNTER — Encounter: Payer: Self-pay | Admitting: Neurology

## 2019-09-16 NOTE — Telephone Encounter (Signed)
-----   Message from Caren Macadam, MD sent at 09/16/2019 11:49 AM EST ----- Dr. Dellia Nims touched base with me and was happy with improvement of skin between toes with keeping them dry, separated so didn't feel systemic (antifungal by mouth) was needed at this time. I would just encourage her to keep seeing wound care and keep following their instructions! I let Dr. Dellia Nims know I would be happy to help and he can reach out any time to collaborate.

## 2019-09-16 NOTE — Telephone Encounter (Signed)
Patient informed of the message below.

## 2019-09-20 ENCOUNTER — Other Ambulatory Visit: Payer: Self-pay

## 2019-09-20 ENCOUNTER — Telehealth (INDEPENDENT_AMBULATORY_CARE_PROVIDER_SITE_OTHER): Payer: 59 | Admitting: Neurology

## 2019-09-20 DIAGNOSIS — G629 Polyneuropathy, unspecified: Secondary | ICD-10-CM

## 2019-09-20 DIAGNOSIS — I7381 Erythromelalgia: Secondary | ICD-10-CM | POA: Diagnosis not present

## 2019-09-20 DIAGNOSIS — R202 Paresthesia of skin: Secondary | ICD-10-CM

## 2019-09-20 MED ORDER — GABAPENTIN 100 MG PO CAPS: ORAL_CAPSULE | ORAL | 3 refills | Status: DC

## 2019-09-20 MED FILL — GABAPENTIN 100 MG CAPSULE: 100 | 67 days supply | Qty: 400 | Fill #0

## 2019-09-20 NOTE — Progress Notes (Signed)
Due to the COVID-19 crisis, this virtual check-in visit was done via telephone from my office and it was initiated and consent given by this patient and or family.   Telephone (Audio) Visit The purpose of this telephone visit is to provide medical care while limiting exposure to the novel coronavirus.    Consent was obtained for telephone visit and initiated by pt/family:  Yes.   Answered questions that patient had about telehealth interaction:  Yes.   I discussed the limitations, risks, security and privacy concerns of performing an evaluation and management service by telephone. I also discussed with the patient that there may be a patient responsible charge related to this service. The patient expressed understanding and agreed to proceed.  Pt location: Home Physician Location: office Name of referring provider:  Caren Macadam, MD I connected with .[name] at patients initiation/request on 09/20/2019 at 10:30 AM EST by telephone and verified that I am speaking with the correct person using two identifiers.  Pt MRN:  [id number] Pt DOB:  [birth year]   History of Present Illness: This is a 69 year-old female returning for follow-up erythromelalgia and bilateral hand tingling.   She attempted to reduce gabapentin 200mg  to twice daily, but developed worsening heat-sensation and swelling.  Pain did not intensify when she tried this.  She went back to taking gabapentin 300mg  in the morning and 200mg  at bedtime.  She was prescribed depakote, but discontinued this due to swelling.  She is no longer soaking her feet in ice water as there was concern it may have exacerbated fungal infection in the feet. Despite this, pain still remains adequately- controlled.     She has blisters over the soles and tops of the feet.  She was seeing wound management because her skin is opening up between her toes. It is not clear what is causing her skin lesions. She has not seen dermatology.    She  continues to have spells of numbness/tingling in the thumb, index, and middle finger which tends to occur mostly during the day when she is sitting. She wakes up with hands falling asleep about once per month.    Assessment and Plan:   1.  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  OK to use extra dose as needed.  Refills sent. Discussed that worsening swelling is not a side effect of gabapentin, since the medication can actually cause swelling/weight gain.     2.  Bilateral paresthesias, most suggestive of carpal tunnel syndrome Declined NCS/EMG unless symptoms progress Trial of wrist splints  Avoid hyperflexion of the wrist splints  3.  Feet blisters, she may benefit from seeking the opinion of dermatology.  Neither neuropathy or erythromelalgia is associated with skin leisons.  I will defer this to her PCP.  Return to clinic in 6 months  Follow Up Instructions:   I discussed the assessment and treatment plan with the patient. The patient was provided an opportunity to ask questions and all were answered. The patient agreed with the plan and demonstrated an understanding of the instructions.   The patient was advised to call back or seek an in-person evaluation if the symptoms worsen or if the condition fails to improve as anticipated.   Total Time spent in visit with the patient was:  27 min, of which 100% of  the time was spent in counseling and/or coordinating care.   Pt understands and agrees with the plan of care outlined.     Alda Berthold, DO

## 2019-09-22 DIAGNOSIS — M7061 Trochanteric bursitis, right hip: Secondary | ICD-10-CM | POA: Diagnosis not present

## 2019-09-23 ENCOUNTER — Telehealth: Payer: Self-pay | Admitting: *Deleted

## 2019-09-23 NOTE — Telephone Encounter (Signed)
Spoke with the patient and informed her of the message below.  Patient stated she would like to see a dermatologist, is aware the referral was placed and someone will call with appt info.

## 2019-09-23 NOTE — Telephone Encounter (Signed)
-----   Message from Caren Macadam, MD sent at 09/22/2019 11:45 PM EST ----- Dr. Posey Pronto mentioned consideration for dermatology referral due to skin lesions. I would encourage her to discuss this with her wound specialist at her follow-up appointment.  If she would like to see dermatology go ahead and put in referral please. ----- Message ----- From: Alda Berthold, DO Sent: 09/20/2019  10:49 AM EST To: Caren Macadam, MD

## 2019-09-23 NOTE — Addendum Note (Signed)
Addended by: Agnes Lawrence on: 09/23/2019 10:24 AM   Modules accepted: Orders

## 2019-09-29 MED FILL — LOSARTAN POTASSIUM 100 MG T: 100 | 30 days supply | Qty: 30 | Fill #6

## 2019-09-29 MED FILL — FUROSEMIDE 20 MG TABS: 20 | 34 days supply | Qty: 200 | Fill #0

## 2019-10-05 ENCOUNTER — Encounter (HOSPITAL_BASED_OUTPATIENT_CLINIC_OR_DEPARTMENT_OTHER): Payer: 59 | Admitting: Internal Medicine

## 2019-11-03 MED FILL — LOSARTAN POTASSIUM 100 MG T: 100 | 30 days supply | Qty: 30 | Fill #7

## 2019-11-03 MED FILL — sulfaSALAzine 500 MG TABS: 500 | 90 days supply | Qty: 360 | Fill #0

## 2019-11-03 MED FILL — METOPROLOL SUCCINATE ER 50: 50 | 90 days supply | Qty: 180 | Fill #3

## 2019-11-22 ENCOUNTER — Other Ambulatory Visit: Payer: Self-pay

## 2019-11-22 ENCOUNTER — Encounter (INDEPENDENT_AMBULATORY_CARE_PROVIDER_SITE_OTHER): Payer: Self-pay | Admitting: Otolaryngology

## 2019-11-22 ENCOUNTER — Ambulatory Visit (INDEPENDENT_AMBULATORY_CARE_PROVIDER_SITE_OTHER): Payer: 59 | Admitting: Otolaryngology

## 2019-11-22 VITALS — Temp 97.7°F

## 2019-11-22 DIAGNOSIS — H60391 Other infective otitis externa, right ear: Secondary | ICD-10-CM | POA: Diagnosis not present

## 2019-11-22 DIAGNOSIS — H6123 Impacted cerumen, bilateral: Secondary | ICD-10-CM | POA: Diagnosis not present

## 2019-11-22 NOTE — Progress Notes (Signed)
HPI: [name] is a 69 y.o. female who presents for evaluation of her ears.  She had previously been followed by Dr. Ernesto Rutherford.  She has had blockage of her ears and she is having some pain recently in the right ear..  Past Medical History:  Diagnosis Date  . Allergy   . Cataract    BILATERAL-REMOVED 2 YEARS AGO  . Erythromelalgia [facility])    followed by neuro Dr Posey Pronto , mgd on gabapentin , dx several years ago   . GERD (gastroesophageal reflux disease)   . Helicobacter pylori gastritis 10/05/2018  . Hx of colonic polyp - ssp 11/03/2014  . Hypercalcemia   . Hypertension   . Left maxillary fracture ([place]) 07/17/2017   fell down my stairs 2 year ago , deneis any metal in place nor difficulty with jax extension   . Neuropathy   . 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 Osteopenia 10/17/2011   DEXA 09/2007: -1.4 L fem; 10/2011: -1.2 L fem   . Osteoporosis   . PONV (postoperative nausea and vomiting)   . Pseudogout of foot   . Rheumatoid arthritis(714.0) dx 2010  . Shingles    hx of   . Tibial plateau fracture, right, closed, initial encounter 07/16/2017   Past Surgical History:  Procedure Laterality Date  . BREAST BIOPSY  1972  . Broken wrist  2010  . CATARACT EXTRACTION  03/2012   left  . COLONOSCOPY    . COSMETIC SURGERY    . FRACTURE SURGERY    . INNER EAR SURGERY     busted ear drum  . MAXIMUM ACCESS (MAS)POSTERIOR LUMBAR INTERBODY FUSION (PLIF) 1 LEVEL N/A 10/11/2016   Procedure: Lumbar one-Sacral one Maximum access posterior lumbar interbody fusion;  Surgeon: Erline Levine, MD;  Location: [place];  Service: Neurosurgery;  Laterality: N/A;  . ORIF WRIST FRACTURE Right 07/18/2017   Procedure: OPEN REDUCTION INTERNAL FIXATION (ORIF) WRIST FRACTURE;  Surgeon: Renette Butters, MD;  Location: [place];  Service: Orthopedics;  Laterality: Right;  . PARATHYROIDECTOMY Left 04/12/2019   Procedure: LEFT SUPERIOR PARATHYROIDECTOMY;  Surgeon: [name], MD;  Location: [facility];  Service:  General;  Laterality: Left;  . pneumonia  2007   Social History   Socioeconomic History  . Marital status: Married    Spouse name: Not on file  . Number of children: 3  . Years of education: Not on file  . Highest education level: Not on file  Occupational History  . Occupation: Retired  Tobacco Use  . Smoking status: Former Smoker    Packs/day: 4.00    Years: 4.00    Pack years: 16.00    Types: Cigarettes    Start date: 43    Quit date: 1985    Years since quitting: 36.2  . Smokeless tobacco: Never Used  Substance and Sexual Activity  . Alcohol use: No    Alcohol/week: 0.0 standard drinks  . Drug use: No  . Sexual activity: Not on file  Other Topics Concern  . Not on file  Social History Narrative   Artist -retired Building control surveyor   Married, lives with spouse, [name], he is IT support for [employer]   3 sons   2 caffeinated beverages a day   No regular exercise, diet is ok   Right handed   Social Determinants of Health   Financial Resource Strain:   . Difficulty of Paying Living Expenses:   Food Insecurity:   . Worried About Estate manager/land agent  of Food in the Last Year:   . [place] in the Last Year:   Transportation Needs:   . Lack of Transportation (Medical):   Marland Kitchen Lack of Transportation (Non-Medical):   Physical Activity:   . Days of Exercise per Week:   . Minutes of Exercise per Session:   Stress:   . Feeling of Stress :   Social Connections:   . Frequency of Communication with Friends and Family:   . Frequency of Social Gatherings with Friends and Family:   . Attends Religious Services:   . Active Member of Clubs or Organizations:   . Attends Archivist Meetings:   Marland Kitchen Marital Status:    Family History  Problem Relation Age of Onset  . Stroke Mother   . Hypertension Father   . Heart attack Father   . Hypertension Sister        x 3  . Hypertension Brother   . Hyperthyroidism Sister        x2, s/p RAI ablation  .  Hypothyroidism Brother   . Colon cancer Neg Hx   . Esophageal cancer Neg Hx   . Rectal cancer Neg Hx   . Stomach cancer Neg Hx    Allergies  Allergen Reactions  . Amlodipine Besylate Other (See Comments)    Tremors  . Depakote [Divalproex Sodium] Swelling  . Nortriptyline Other (See Comments)    tremors  . Dilaudid [Hydromorphone Hcl]     Headache, muscle tightness   Prior to Admission medications   Medication Sig Start Date End Date Taking? Authorizing Provider  acetaminophen (TYLENOL) 650 MG CR tablet Take 650 mg by mouth every 8 (eight) hours as needed for pain.   Yes [provider]  aspirin 81 MG tablet Take 81 mg by mouth at bedtime.    Yes [provider]  Cholecalciferol (VITAMIN D) 125 MCG (5000 UT) CAPS Take 5,000 Units by mouth daily.   Yes [provider]  diclofenac sodium (VOLTAREN) 1 % GEL Apply 2 g topically 4 (four) times daily as needed (pain).   Yes [provider]  diphenhydrAMINE HCl (BENADRYL PO) Take by mouth as needed.    Yes [provider]  Fexofenadine HCl (MUCINEX ALLERGY PO) Take by mouth daily as needed.   Yes [provider]  furosemide (LASIX) 20 MG tablet Take 2-3 tablets (40-60 mg total) by mouth 2 (two) times daily as needed for edema. 09/02/19  Yes Koberlein, [name] C, MD  gabapentin (NEURONTIN) 100 MG capsule Take 200mg  (2 tab) twice daily.  Ok to take extra dose as needed for severe pain. 09/20/19  Yes Patel, Donika K, DO  hydroxychloroquine (PLAQUENIL) 200 MG tablet Take 200 mg by mouth 2 (two) times daily.    Yes [provider]  hydroxypropyl methylcellulose / hypromellose (ISOPTO TEARS / GONIOVISC) 2.5 % ophthalmic solution Place 1 drop into both eyes daily.   Yes [provider]  lidocaine (XYLOCAINE) 5 % ointment Apply sparingly up to TID for pain 07/30/19  Yes Koberlein, [name] C, MD  losartan (COZAAR) 100 MG tablet Take 1 tablet (100 mg total) by mouth daily. 02/09/19  Yes  Colin Benton R, DO  metoprolol succinate (TOPROL-XL) 50 MG 24 hr tablet TAKE 2 TABLETS BY MOUTH DAILY. TAKE WITH OR IMMEDIATELY FOLLOWING A MEAL Patient taking differently: Take 50 mg by mouth 2 (two) times daily.  02/09/19  Yes Lucretia Kern, DO  mupirocin ointment (BACTROBAN) 2 % Apply 1 application  topically 2 (two) times daily. 05/21/19  Yes Koberlein, [name] C, MD  sulfaSALAzine (AZULFIDINE) 500 MG EC tablet TAKE 2 TABLETS BY MOUTH TWICE DAILY Patient taking differently: Take 1,000 mg by mouth 2 (two) times daily.  07/17/16  Yes Deveshwar, Abel Presto, MD  tiZANidine (ZANAFLEX) 2 MG tablet Take 2 mg by mouth every 8 (eight) hours as needed for muscle spasms.   Yes [provider]     Positive ROS: Otherwise negative  All other systems have been reviewed and were otherwise negative with the exception of those mentioned in the HPI and as above.  Physical Exam: Constitutional: Alert, well-appearing, no acute distress Ears: External ears without lesions or tenderness. Ear canals she had a large amount of wax in both ear canals that was cleaned with suction and curettes.  On the right side she also had a small sore deep within the right ear canal that is causing discomfort.  I applied CSF powder to the right ear canal only.. Nasal: External nose without lesions. Clear nasal passages Oral: Oropharynx clear. Neck: No palpable adenopathy or masses Respiratory: Breathing comfortably  Skin: No facial/neck lesions or rash noted.  Cerumen impaction removal  Date/Time: 11/22/2019 5:21 PM Performed by: Rozetta Nunnery, MD Authorized by: Rozetta Nunnery, MD   Consent:    Consent obtained:  Verbal   Consent given by:  Patient   Risks discussed:  Pain and bleeding Procedure details:    Location:  L ear and R ear   Procedure type: curette and suction   Post-procedure details:    Inspection:  TM intact and canal normal   Hearing quality:  Improved   Patient tolerance of procedure:   Tolerated well, no immediate complications Comments:     TMs are clear bilaterally.  However she has some slight inflammatory changes within the right ear canal posteriorly adjacent to the TM.  I applied CSF powder to the right ear.    Assessment: Bilateral cerumen impactions. Small sore within the right ear canal.  Plan: Prescribed Cortisporin otic suspension drops to use in the right ear 4 to 5 drops twice daily for 5 days if she has any further pain or discomfort on the right side. She will follow-up as needed.  Radene Journey, MD

## 2019-11-23 MED FILL — NEOMYCIN-POLYMYXIN-HC EAR S: 3.5-10000-1 | 20 days supply | Qty: 10 | Fill #0

## 2019-11-29 MED FILL — FUROSEMIDE 20 MG TABS: 20 | 34 days supply | Qty: 200 | Fill #1

## 2019-12-03 MED FILL — LOSARTAN POTASSIUM 100 MG T: 100 | 30 days supply | Qty: 30 | Fill #8

## 2019-12-10 ENCOUNTER — Other Ambulatory Visit (HOSPITAL_COMMUNITY): Payer: Self-pay | Admitting: Physician Assistant

## 2019-12-10 DIAGNOSIS — M5136 Other intervertebral disc degeneration, lumbar region: Secondary | ICD-10-CM | POA: Diagnosis not present

## 2019-12-10 DIAGNOSIS — R5382 Chronic fatigue, unspecified: Secondary | ICD-10-CM | POA: Diagnosis not present

## 2019-12-10 DIAGNOSIS — I7381 Erythromelalgia: Secondary | ICD-10-CM | POA: Diagnosis not present

## 2019-12-10 DIAGNOSIS — M0609 Rheumatoid arthritis without rheumatoid factor, multiple sites: Secondary | ICD-10-CM | POA: Diagnosis not present

## 2019-12-10 DIAGNOSIS — Z6841 Body Mass Index (BMI) 40.0 and over, adult: Secondary | ICD-10-CM | POA: Diagnosis not present

## 2019-12-10 DIAGNOSIS — M2550 Pain in unspecified joint: Secondary | ICD-10-CM | POA: Diagnosis not present

## 2019-12-10 DIAGNOSIS — M5441 Lumbago with sciatica, right side: Secondary | ICD-10-CM | POA: Diagnosis not present

## 2019-12-10 MED FILL — HYDROXYCHLOROQUINE 200 MG T: 200 | 90 days supply | Qty: 180 | Fill #0

## 2019-12-15 MED FILL — AMOXICILLIN 500 MG CAPSULE: 500 | 4 days supply | Qty: 16 | Fill #0

## 2019-12-15 MED FILL — GABAPENTIN 100 MG CAPSULE: 100 | 67 days supply | Qty: 400 | Fill #1

## 2020-01-05 MED FILL — SODIUM FLUORIDE 5000 SENSIT: 1.1-5 | 30 days supply | Qty: 100 | Fill #0

## 2020-01-06 MED FILL — LOSARTAN POTASSIUM 100 MG T: 100 | 30 days supply | Qty: 30 | Fill #9

## 2020-01-20 ENCOUNTER — Other Ambulatory Visit: Payer: Self-pay | Admitting: Family Medicine

## 2020-01-20 DIAGNOSIS — R600 Localized edema: Secondary | ICD-10-CM

## 2020-01-21 MED FILL — FUROSEMIDE 20 MG TABS: 20 | 34 days supply | Qty: 200 | Fill #0

## 2020-01-27 ENCOUNTER — Other Ambulatory Visit: Payer: Self-pay | Admitting: Family Medicine

## 2020-02-01 MED FILL — SULFASALAZINE 500 MG TABS: 500 | 30 days supply | Qty: 120 | Fill #0

## 2020-02-01 MED FILL — AMOXICILLIN 500 MG CAPSULE: 500 | 7 days supply | Qty: 21 | Fill #0

## 2020-02-07 ENCOUNTER — Other Ambulatory Visit: Payer: Self-pay | Admitting: Family Medicine

## 2020-02-07 MED FILL — LOSARTAN POTASSIUM 100 MG T: 100 | 90 days supply | Qty: 90 | Fill #0

## 2020-02-18 ENCOUNTER — Ambulatory Visit: Payer: 59 | Admitting: Family Medicine

## 2020-02-18 MED FILL — GABAPENTIN 100 MG CAPSULE: 100 | 67 days supply | Qty: 400 | Fill #2

## 2020-03-01 MED FILL — SULFASALAZINE 500 MG TABS: 500 | 30 days supply | Qty: 120 | Fill #1

## 2020-03-08 MED FILL — tiZANidine HCL 2 MG TABS: 2 | 15 days supply | Qty: 60 | Fill #0

## 2020-03-13 ENCOUNTER — Other Ambulatory Visit: Payer: Self-pay | Admitting: Family Medicine

## 2020-03-13 DIAGNOSIS — R600 Localized edema: Secondary | ICD-10-CM

## 2020-03-13 MED FILL — HYDROXYCHLOROQUINE 200 MG T: 200 | 90 days supply | Qty: 180 | Fill #1

## 2020-03-13 MED FILL — FUROSEMIDE 20 MG TABS: 20 | 33 days supply | Qty: 200 | Fill #0

## 2020-03-17 ENCOUNTER — Telehealth (INDEPENDENT_AMBULATORY_CARE_PROVIDER_SITE_OTHER): Payer: 59 | Admitting: Family Medicine

## 2020-03-17 ENCOUNTER — Ambulatory Visit: Payer: 59 | Admitting: Family Medicine

## 2020-03-17 DIAGNOSIS — M069 Rheumatoid arthritis, unspecified: Secondary | ICD-10-CM

## 2020-03-17 DIAGNOSIS — R600 Localized edema: Secondary | ICD-10-CM

## 2020-03-17 DIAGNOSIS — E785 Hyperlipidemia, unspecified: Secondary | ICD-10-CM | POA: Diagnosis not present

## 2020-03-17 DIAGNOSIS — D509 Iron deficiency anemia, unspecified: Secondary | ICD-10-CM

## 2020-03-17 DIAGNOSIS — I7381 Erythromelalgia: Secondary | ICD-10-CM

## 2020-03-17 DIAGNOSIS — R739 Hyperglycemia, unspecified: Secondary | ICD-10-CM

## 2020-03-17 DIAGNOSIS — E538 Deficiency of other specified B group vitamins: Secondary | ICD-10-CM

## 2020-03-17 DIAGNOSIS — I10 Essential (primary) hypertension: Secondary | ICD-10-CM

## 2020-03-17 DIAGNOSIS — G629 Polyneuropathy, unspecified: Secondary | ICD-10-CM

## 2020-03-17 DIAGNOSIS — E559 Vitamin D deficiency, unspecified: Secondary | ICD-10-CM

## 2020-03-17 DIAGNOSIS — M810 Age-related osteoporosis without current pathological fracture: Secondary | ICD-10-CM | POA: Diagnosis not present

## 2020-03-17 MED ORDER — KETOCONAZOLE 2 % EX CREA: 1.0000 "application " | TOPICAL_CREAM | Freq: Every day | CUTANEOUS | 5 refills | Status: DC

## 2020-03-17 MED FILL — KETOCONAZOLE 2% CREAM: 2 | 30 days supply | Qty: 60 | Fill #0

## 2020-03-17 NOTE — Progress Notes (Signed)
Virtual Visit via Video Note  I connected with@ on 03/19/20 at  4:00 PM EDT by a video enabled telemedicine application and verified that I am speaking with the correct person using two identifiers.  Location patient: home Location provider:work or home office Persons participating in the virtual visit: patient, provider  I discussed the limitations of evaluation and management by telemedicine and the availability of in person appointments. The patient expressed understanding and agreed to proceed.   [name] DOB: 09-Jul-1951 Encounter date: 03/17/2020  This is a 69 y.o. female who presents with No chief complaint on file.   History of present illness:  Still has issue with feet. Swelling comes and goes. Has noticed back hurting and notes that if she bends over to pick something up it bothers her back. Then when back flares it seems like it causes right leg to swell. Just has to be really cautious with back. Makes index, middle finger, and thumb go numb on both hands. Was told that this could be part of her neuropathy. Uses muscle relaxer - flexeril - which helps to relax and does seem to stop symptoms. It is lower back that bothers her. No wrist pain. Right leg swells as soon as she strains back. No neck pain. Swelling also seems to get better with flexeril. Takes about an hour for it to kick in but then fine. No issues with headaches. Hasn't had nerve testing on hands. Has had this on legs.   HTN: bp is staying high. Not sure about number right now. In process of having dental implant.    Allergies  Allergen Reactions  . Amlodipine Besylate Other (See Comments)    Tremors  . Depakote [Divalproex Sodium] Swelling  . Nortriptyline Other (See Comments)    tremors  . Dilaudid [Hydromorphone Hcl]     Headache, muscle tightness   No outpatient medications have been marked as taking for the 03/17/20 encounter (Video Visit) with Caren Macadam, MD.    Review of Systems   Constitutional: Negative for chills, fatigue and fever.  Respiratory: Negative for cough, chest tightness, shortness of breath and wheezing.   Cardiovascular: Positive for leg swelling (at baseline). Negative for chest pain and palpitations.  Skin: Positive for color change.       Skin on both legs and feet remains extremely red and extremely sensitive.  She states she has no open sores or wounds at this time.  She continues to apply silver alginate and ketoconazole cream to her legs and wraps them daily.    Objective:  There were no vitals taken for this visit.      BP Readings from Last 3 Encounters:  07/30/19 (!) 150/90  05/13/19 136/84  04/12/19 (!) 174/85   Wt Readings from Last 3 Encounters:  07/30/19 260 lb 3.2 oz (118 kg)  05/17/19 256 lb (116.1 kg)  05/13/19 256 lb (116.1 kg)    EXAM:  GENERAL: alert, oriented, appears well and in no acute distress  HEENT: atraumatic, conjunctiva clear, no obvious abnormalities on inspection of external nose and ears  NECK: normal movements of the head and neck  LUNGS: on inspection no signs of respiratory distress, breathing rate appears normal, no obvious gross SOB, gasping or wheezing  CV: no obvious cyanosis  MS: moves all visible extremities without noticeable abnormality  PSYCH/NEURO: pleasant and cooperative, no obvious depression or anxiety, speech and thought processing grossly intact  Skin: Legs are swollen, reddish to blue in color (patient states cream  makes legs appear lighter in color)  Assessment/Plan  1. Essential hypertension I have asked her to check her blood pressures at home and let me know if we have any room for adjustment of medication.  If blood pressure is high at home, I would suggest we add an additional diuretic medication to her regimen and continue with the Lasix.  She does get some relief of edema with Lasix, especially if taking twice daily.  She will report blood pressures back to be when we get  lab results.  2. Erythromelalgia ([place]) Unfortunately, she has not had much benefit from different treatment options, and only gets relief with ice.  I am going to reach out to her wound care specialist to see if chronic antibiotics would be helpful at this point.  I do worry about long-term effects of long courses of antibiotics with her, and have concerns that she will not get benefit from this.  3. Neuropathy She is taking the gabapentin.  Uncertain how much relief she gets with it.  4. Rheumatoid arthritis, involving unspecified site, unspecified whether rheumatoid factor present [facility]) Follows with rheumatology. On plaquenil.   Return for pending bloodwork, blood pressure report.  I discussed the assessment and treatment plan with the patient. The patient was provided an opportunity to ask questions and all were answered. The patient agreed with the plan and demonstrated an understanding of the instructions.   The patient was advised to call back or seek an in-person evaluation if the symptoms worsen or if the condition fails to improve as anticipated.  I provided 30 minutes of non-face-to-face time during this encounter.   [name], MD

## 2020-03-20 ENCOUNTER — Telehealth: Payer: Self-pay | Admitting: *Deleted

## 2020-03-20 NOTE — Telephone Encounter (Signed)
-----   Message from Caren Macadam, MD sent at 03/19/2020  4:49 PM EDT ----- Please call and set up lab visit for her

## 2020-03-20 NOTE — Telephone Encounter (Signed)
Spoke with the pt and scheduled a lab appt on 8/13 to arrive at 8:45am.  Patient also wanted to let Dr Ethlyn Gallery know she feels her BP has been elevated.  BP readings are as below: 7/31-156/93  Pulse 65, 152/89  Pulse 64, 154/91 pulse 65   Message sent to PCP.

## 2020-03-22 ENCOUNTER — Other Ambulatory Visit: Payer: Self-pay | Admitting: Family Medicine

## 2020-03-22 MED ORDER — CHLORTHALIDONE 25 MG PO TABS: 25.0000 mg | ORAL_TABLET | Freq: Every day | ORAL | 1 refills | Status: DC

## 2020-03-22 MED FILL — CHLORTHALIDONE 25 MG TABS: 25 | 90 days supply | Qty: 90 | Fill #0

## 2020-03-22 NOTE — Telephone Encounter (Signed)
I have added chlorthalidone to take once daily.  She can take this in the morning and then take the Lasix a few hours later.  Hopefully this will help additionally with her blood pressure as well as edema.

## 2020-03-22 NOTE — Telephone Encounter (Signed)
Spoke with the pt and informed her of the message below.   

## 2020-03-22 NOTE — Addendum Note (Signed)
Addended by: Marrion Coy on: 03/22/2020 08:33 AM   Modules accepted: Orders

## 2020-03-24 ENCOUNTER — Ambulatory Visit: Payer: 59 | Admitting: Neurology

## 2020-03-29 MED FILL — sulfaSALAzine 500 MG TABS: 500 | 30 days supply | Qty: 120 | Fill #2

## 2020-03-31 ENCOUNTER — Other Ambulatory Visit (INDEPENDENT_AMBULATORY_CARE_PROVIDER_SITE_OTHER): Payer: 59

## 2020-03-31 ENCOUNTER — Other Ambulatory Visit: Payer: Self-pay

## 2020-03-31 DIAGNOSIS — E559 Vitamin D deficiency, unspecified: Secondary | ICD-10-CM | POA: Diagnosis not present

## 2020-03-31 DIAGNOSIS — E785 Hyperlipidemia, unspecified: Secondary | ICD-10-CM

## 2020-03-31 DIAGNOSIS — R739 Hyperglycemia, unspecified: Secondary | ICD-10-CM | POA: Diagnosis not present

## 2020-03-31 DIAGNOSIS — I10 Essential (primary) hypertension: Secondary | ICD-10-CM | POA: Diagnosis not present

## 2020-03-31 DIAGNOSIS — E538 Deficiency of other specified B group vitamins: Secondary | ICD-10-CM | POA: Diagnosis not present

## 2020-04-01 LAB — COMPREHENSIVE METABOLIC PANEL
AG Ratio: 1.5 (calc) (ref 1.0–2.5)
ALT: 16 U/L (ref 6–29)
AST: 15 U/L (ref 10–35)
Albumin: 4.3 g/dL (ref 3.6–5.1)
Alkaline phosphatase (APISO): 66 U/L (ref 37–153)
BUN/Creatinine Ratio: 11 (calc) (ref 6–22)
BUN: 11 mg/dL (ref 7–25)
CO2: 34 mmol/L — ABNORMAL HIGH (ref 20–32)
Calcium: 9.3 mg/dL (ref 8.6–10.4)
Chloride: 99 mmol/L (ref 98–110)
Creat: 1.04 mg/dL — ABNORMAL HIGH (ref 0.60–0.93)
Globulin: 2.8 g/dL (calc) (ref 1.9–3.7)
Glucose, Bld: 116 mg/dL — ABNORMAL HIGH (ref 65–99)
Potassium: 4 mmol/L (ref 3.5–5.3)
Sodium: 138 mmol/L (ref 135–146)
Total Bilirubin: 0.6 mg/dL (ref 0.2–1.2)
Total Protein: 7.1 g/dL (ref 6.1–8.1)

## 2020-04-01 LAB — CBC WITH DIFFERENTIAL/PLATELET
Absolute Monocytes: 505 cells/uL (ref 200–950)
Basophils Absolute: 39 cells/uL (ref 0–200)
Basophils Relative: 0.8 %
Eosinophils Absolute: 78 cells/uL (ref 15–500)
Eosinophils Relative: 1.6 %
HCT: 44.7 % (ref 35.0–45.0)
Hemoglobin: 15.1 g/dL (ref 11.7–15.5)
Lymphs Abs: 1323 cells/uL (ref 850–3900)
MCH: 29 pg (ref 27.0–33.0)
MCHC: 33.8 g/dL (ref 32.0–36.0)
MCV: 86 fL (ref 80.0–100.0)
MPV: 9.2 fL (ref 7.5–12.5)
Monocytes Relative: 10.3 %
Neutro Abs: 2955 cells/uL (ref 1500–7800)
Neutrophils Relative %: 60.3 %
Platelets: 217 10*3/uL (ref 140–400)
RBC: 5.2 10*6/uL — ABNORMAL HIGH (ref 3.80–5.10)
RDW: 13.3 % (ref 11.0–15.0)
Total Lymphocyte: 27 %
WBC: 4.9 10*3/uL (ref 3.8–10.8)

## 2020-04-01 LAB — LIPID PANEL
Cholesterol: 231 mg/dL — ABNORMAL HIGH (ref <200)
HDL: 40 mg/dL — ABNORMAL LOW (ref >=50)
LDL Cholesterol (Calc): 158 mg/dL (calc) — ABNORMAL HIGH
Non-HDL Cholesterol (Calc): 191 mg/dL (calc) — ABNORMAL HIGH (ref <130)
Total CHOL/HDL Ratio: 5.8 (calc) — ABNORMAL HIGH (ref <5.0)
Triglycerides: 176 mg/dL — ABNORMAL HIGH (ref <150)

## 2020-04-01 LAB — VITAMIN B12: Vitamin B-12: 381 pg/mL (ref 200–1100)

## 2020-04-01 LAB — VITAMIN D 25 HYDROXY (VIT D DEFICIENCY, FRACTURES): Vit D, 25-Hydroxy: 39 ng/mL (ref 30–100)

## 2020-04-01 LAB — HEMOGLOBIN A1C
Hgb A1c MFr Bld: 5.6 % of total Hgb (ref <5.7)
Mean Plasma Glucose: 114 (calc)
eAG (mmol/L): 6.3 (calc)

## 2020-04-01 LAB — FOLATE: Folate: 7.3 ng/mL

## 2020-04-01 LAB — TSH: TSH: 1.34 mIU/L (ref 0.40–4.50)

## 2020-04-05 ENCOUNTER — Telehealth: Payer: Self-pay | Admitting: *Deleted

## 2020-04-05 NOTE — Telephone Encounter (Signed)
-----   Message from Caren Macadam, MD sent at 04/05/2020  9:44 AM EDT ----- She had asked about long term antibiotics and after thinking through with Dr. Dellia Nims; I just am not convinced it is helpful unless there is acute worsening of skin. Just wanted to let her know. ----- Message ----- From: Ricard Dillon, MD Sent: 04/04/2020   7:48 AM EDT To: Caren Macadam, MD    Hi [name]; no I did not get any previous messages from you.  I have reviewed my notes on [name].  The only thing that I really did for this woman is treat her tinea pedis i.e. the excoriated painful areas between her toes and control her lymphedema.  I did not have any sense of a bacterial infection at the time that would warrant ongoing antibiotic therapy.  Certainly tinea pedis can become a refractory thing requiring recurrent antifungal therapy but I am not sure that is what she is describing.  At that time I felt most of her pain was probably secondary to peripheral neuropathy.  She did not have any active rheumatoid arthritis in her feet that I could tell.  She did have a deterioration in her arterial studies and I also wondered whether she needed to see vascular.  Good luck with her, I saw her as a very complicated woman.  Ronalee Belts  ----- Message ----- From: Caren Macadam, MD Sent: 04/03/2020   2:14 PM EDT To: Ricard Dillon, MD  I'm not sure if you got the previous message I sent to you; [name] was wondering about longer-term antibiotics to see if this would help her.  I wanted to check with you before starting anything.  She continues to have significant pain.  She states that open wounds have healed up.  She continues to use the topical treatments and wraps that she would recommended.  I am hesitant to put her on antibiotics, which in the past do not seem to make huge difference for her pain and might set her up for other complications.  Please let me know your thoughts when you get a chance.  Thanks!  [name]

## 2020-04-05 NOTE — Telephone Encounter (Signed)
Left a detailed message at the pts home number with the information below. 

## 2020-04-26 MED FILL — sulfaSALAzine 500 MG TABS: 500 | 30 days supply | Qty: 120 | Fill #3

## 2020-04-26 MED FILL — GABAPENTIN 100 MG CAPSULE: 100 | 67 days supply | Qty: 400 | Fill #3

## 2020-05-03 ENCOUNTER — Other Ambulatory Visit: Payer: Self-pay | Admitting: Family Medicine

## 2020-05-03 DIAGNOSIS — R600 Localized edema: Secondary | ICD-10-CM

## 2020-05-03 MED FILL — FUROSEMIDE 20 MG TABS: 20 | 33 days supply | Qty: 200 | Fill #0

## 2020-05-03 MED FILL — LOSARTAN POTASSIUM 100 MG T: 100 | 90 days supply | Qty: 90 | Fill #1

## 2020-05-04 ENCOUNTER — Other Ambulatory Visit: Payer: Self-pay | Admitting: Family Medicine

## 2020-05-04 MED FILL — METOPROLOL SUCCINATE ER 50: 50 | 90 days supply | Qty: 180 | Fill #0

## 2020-05-31 MED FILL — sulfaSALAzine 500 MG TABS: 500 | 30 days supply | Qty: 120 | Fill #4

## 2020-06-09 ENCOUNTER — Ambulatory Visit: Payer: 59 | Admitting: Family Medicine

## 2020-06-13 ENCOUNTER — Other Ambulatory Visit: Payer: Self-pay | Admitting: Family Medicine

## 2020-06-13 DIAGNOSIS — R600 Localized edema: Secondary | ICD-10-CM

## 2020-06-14 MED FILL — FUROSEMIDE 20 MG TABS: 20 | 33 days supply | Qty: 200 | Fill #0

## 2020-06-15 ENCOUNTER — Other Ambulatory Visit (HOSPITAL_COMMUNITY): Payer: Self-pay | Admitting: Physician Assistant

## 2020-06-15 DIAGNOSIS — M0609 Rheumatoid arthritis without rheumatoid factor, multiple sites: Secondary | ICD-10-CM | POA: Diagnosis not present

## 2020-06-15 DIAGNOSIS — M2550 Pain in unspecified joint: Secondary | ICD-10-CM | POA: Diagnosis not present

## 2020-06-15 DIAGNOSIS — I7381 Erythromelalgia: Secondary | ICD-10-CM | POA: Diagnosis not present

## 2020-06-15 DIAGNOSIS — M5136 Other intervertebral disc degeneration, lumbar region: Secondary | ICD-10-CM | POA: Diagnosis not present

## 2020-06-15 DIAGNOSIS — M5441 Lumbago with sciatica, right side: Secondary | ICD-10-CM | POA: Diagnosis not present

## 2020-06-15 DIAGNOSIS — Z6841 Body Mass Index (BMI) 40.0 and over, adult: Secondary | ICD-10-CM | POA: Diagnosis not present

## 2020-06-15 MED FILL — NAPROXEN 500 MG TABS: 500 | 30 days supply | Qty: 60 | Fill #0

## 2020-06-16 ENCOUNTER — Other Ambulatory Visit (HOSPITAL_COMMUNITY): Payer: Self-pay | Admitting: Physician Assistant

## 2020-06-16 MED FILL — HYDROXYCHLOROQUINE 200 MG T: 200 | 90 days supply | Qty: 180 | Fill #0

## 2020-06-28 ENCOUNTER — Other Ambulatory Visit: Payer: Self-pay | Admitting: Neurology

## 2020-06-28 ENCOUNTER — Telehealth: Payer: Self-pay | Admitting: Neurology

## 2020-06-28 MED FILL — sulfaSALAzine 500 MG TABS: 500 | 30 days supply | Qty: 120 | Fill #5

## 2020-06-29 NOTE — Telephone Encounter (Signed)
Patient last appt 09/20/2019. Due for a f/u in 6 months in 03/2020 but canceled. No f/u scheduled. Requesting refill for Gabapentin 100 mg.

## 2020-06-30 ENCOUNTER — Other Ambulatory Visit: Payer: Self-pay | Admitting: Neurology

## 2020-06-30 MED ORDER — GABAPENTIN 100 MG PO CAPS
ORAL_CAPSULE | ORAL | 1 refills | Status: DC
Start: 2020-06-30 — End: 2020-11-17

## 2020-06-30 MED FILL — GABAPENTIN 100 MG CAPSULE: 100 | 67 days supply | Qty: 400 | Fill #0

## 2020-06-30 NOTE — Telephone Encounter (Signed)
Called patient and left message for a call back.  

## 2020-06-30 NOTE — Telephone Encounter (Signed)
Refill will be sent, please ask her to follow-up in February for 1 year visit and additional refills. Thanks.

## 2020-07-03 NOTE — Telephone Encounter (Signed)
Called patient and informed her that refills were sent. Also informed patient that a f/u needs to be scheduled for February for patients 1 year f/u and any additional refills. Patient was transferred to front to schedule her f/u.

## 2020-07-05 ENCOUNTER — Encounter: Payer: Self-pay | Admitting: Family Medicine

## 2020-07-05 ENCOUNTER — Telehealth (INDEPENDENT_AMBULATORY_CARE_PROVIDER_SITE_OTHER): Payer: 59 | Admitting: Family Medicine

## 2020-07-05 VITALS — BP 162/89 | HR 77 | Wt 257.0 lb

## 2020-07-05 DIAGNOSIS — I7381 Erythromelalgia: Secondary | ICD-10-CM

## 2020-07-05 DIAGNOSIS — E785 Hyperlipidemia, unspecified: Secondary | ICD-10-CM

## 2020-07-05 DIAGNOSIS — M8580 Other specified disorders of bone density and structure, unspecified site: Secondary | ICD-10-CM

## 2020-07-05 DIAGNOSIS — R739 Hyperglycemia, unspecified: Secondary | ICD-10-CM | POA: Diagnosis not present

## 2020-07-05 DIAGNOSIS — M810 Age-related osteoporosis without current pathological fracture: Secondary | ICD-10-CM

## 2020-07-05 DIAGNOSIS — I10 Essential (primary) hypertension: Secondary | ICD-10-CM

## 2020-07-05 DIAGNOSIS — D509 Iron deficiency anemia, unspecified: Secondary | ICD-10-CM | POA: Diagnosis not present

## 2020-07-05 DIAGNOSIS — M069 Rheumatoid arthritis, unspecified: Secondary | ICD-10-CM

## 2020-07-05 DIAGNOSIS — E213 Hyperparathyroidism, unspecified: Secondary | ICD-10-CM

## 2020-07-05 NOTE — Progress Notes (Signed)
Virtual Visit via Video Note  I connected with [name] on 07/05/20 at  2:30 PM EST by a video enabled telemedicine application and verified that I am speaking with the correct person using two identifiers.  Location patient: home Location provider: [place], [place] [address] Persons participating in the virtual visit: patient, provider  I discussed the limitations of evaluation and management by telemedicine and the availability of in person appointments. The patient expressed understanding and agreed to proceed.   [name] DOB: [birth year] Encounter date: 07/05/2020  This is a 69 y.o. female who presents with Chief Complaint  Patient presents with  . Follow-up    History of present illness: Erythromelalgia: not much change in legs.   HTN: chlorthalidone added after last visit due to htn/edema - didn't take this due to worry about side effects of medication after reading about it- states that bp is staying elevated but some days better like 130's/higher 80's. Takes 3 lasix in morning and 2 in afternoon.  Osteopenia - takes vitamin D, cannot really do weight bearing exercise due to issues with leg pain.  HU:[id number] with rheumatology; on plaquenil. Was put on naproxen for pain in feet, but it doesn't help her much. Hasn't changed legs much.   Hyperlipidemia:elevated on 03/31/20 labwork -  Lipid Panel     Component Value Date/Time   CHOL 231 (H) 03/31/2020 0845   CHOL 225 (H) 06/21/2014 1146   TRIG 176 (H) 03/31/2020 0845   TRIG 169 (H) 06/21/2014 1146   HDL 40 (L) 03/31/2020 0845   HDL 48 06/21/2014 1146   CHOLHDL 5.8 (H) 03/31/2020 0845   VLDL 30.6 02/10/2019 1404   LDLCALC 158 (H) 03/31/2020 0845   LDLCALC 143 (H) 06/21/2014 1146   LDLDIRECT 133.0 01/29/2018 1247    Hyperglycemia - A1C from 03/2020 was 5.6    Allergies  Allergen Reactions  . Amlodipine Besylate Other (See Comments)    Tremors  . Depakote  [Divalproex Sodium] Swelling  . Nortriptyline Other (See Comments)    tremors  . Dilaudid [Hydromorphone Hcl]     Headache, muscle tightness   Current Meds  Medication Sig  . acetaminophen (TYLENOL) 650 MG CR tablet Take 650 mg by mouth every 8 (eight) hours as needed for pain.  Marland Kitchen aspirin 81 MG tablet Take 81 mg by mouth at bedtime.   . chlorthalidone (HYGROTON) 25 MG tablet Take 1 tablet (25 mg total) by mouth daily.  . Cholecalciferol (VITAMIN D) 125 MCG (5000 UT) CAPS Take 5,000 Units by mouth daily.  . diclofenac sodium (VOLTAREN) 1 % GEL Apply 2 g topically 4 (four) times daily as needed (pain).  . Fexofenadine HCl (MUCINEX ALLERGY PO) Take by mouth daily as needed.  . furosemide (LASIX) 20 MG tablet TAKE 2 TO 3 TABLETS BY MOUTH TWO TIMES DAILY AS NEEDED FOR EDEMA  . gabapentin (NEURONTIN) 100 MG capsule Take 200mg  (2 tab) twice daily.  Ok to take extra dose as needed for severe pain.  . hydroxychloroquine (PLAQUENIL) 200 MG tablet Take 200 mg by mouth 2 (two) times daily.   . hydroxypropyl methylcellulose / hypromellose (ISOPTO TEARS / GONIOVISC) 2.5 % ophthalmic solution Place 1 drop into both eyes daily.  Marland Kitchen ketoconazole (NIZORAL) 2 % cream Apply 1 application topically daily.  Marland Kitchen lidocaine (XYLOCAINE) 5 % ointment Apply sparingly up to TID for pain  . losartan (COZAAR) 100 MG tablet TAKE 1 TABLET BY MOUTH ONCE A  DAY  . metoprolol succinate (TOPROL-XL) 50 MG 24 hr tablet TAKE 1 TABLET BY MOUTH TWICE DAILY  . mupirocin ointment (BACTROBAN) 2 % Apply 1 application topically 2 (two) times daily.  . naproxen (NAPROSYN) 500 MG tablet SMARTSIG:1 Tablet(s) By Mouth Every 12 Hours PRN  . sulfaSALAzine (AZULFIDINE) 500 MG EC tablet TAKE 2 TABLETS BY MOUTH TWICE DAILY (Patient taking differently: Take 1,000 mg by mouth 2 (two) times daily. )  . tiZANidine (ZANAFLEX) 2 MG tablet Take 2 mg by mouth every 8 (eight) hours as needed for muscle spasms.    Review of Systems  Constitutional:  Negative for chills, fatigue and fever.  Respiratory: Negative for cough, chest tightness, shortness of breath and wheezing.   Cardiovascular: Negative for chest pain, palpitations and leg swelling.    Objective:  BP (!) 162/89   Pulse 77   Wt 257 lb (116.6 kg)   BMI 41.48 kg/m   Weight: 257 lb (116.6 kg)   BP Readings from Last 3 Encounters:  07/05/20 (!) 162/89  07/30/19 (!) 150/90  05/13/19 136/84   Wt Readings from Last 3 Encounters:  07/05/20 257 lb (116.6 kg)  07/30/19 260 lb 3.2 oz (118 kg)  05/17/19 256 lb (116.1 kg)    EXAM:  GENERAL: alert, oriented, appears well and in no acute distress  HEENT: atraumatic, conjunctiva clear, no obvious abnormalities on inspection of external nose and ears  NECK: normal movements of the head and neck  LUNGS: on inspection no signs of respiratory distress, breathing rate appears normal, no obvious gross SOB, gasping or wheezing  CV: no obvious cyanosis  MS: moves all visible extremities without noticeable abnormality  PSYCH/NEURO: pleasant and cooperative, no obvious depression or anxiety, speech and thought processing grossly intact  Assessment/Plan  1. Erythromelalgia ([place]) I am hopeful that we may be able to look into some alternative treatment options for erythromelalgia.  Looking at the [employer] for rare diseases, some has had improvement in symptoms with nerve blocks or epidurals.  Would like to see if this may be an option for her.  She has not had much success with anything topical or oral as it relates to pain.  Pain that brings her comfort is ice water soaks. - Ambulatory referral to Pain Clinic  2. Hyperparathyroidism ([place]) She does follow with endocrine allergy and is monitored through them.  Needs to set up follow-up visit.  3. Hyperglycemia Blood sugars been stable.  4. Hyperlipidemia, unspecified hyperlipidemia type She is worried to try statin due to potential for pain.  We will continue to  monitor cholesterol numbers and consider addition if needed in the future.  Currently, more concerned with elevated blood pressure (see below).  5. Primary hypertension She was worried about side effects with chlorthalidone after reading package insert, but has agreed to try this medication.  I am hopeful that it will help get some fluid out of her lower extremities and bring slight relief, as well as controlled blood pressure.  We discussed risks of uncontrolled blood pressure including stroke and heart attack.  I encouraged her to let me know if she has any difficulty at all with the medication as we have multiple medication options.  Continue with the losartan 100 mg and metoprolol 50 mg.  6.  Osteopenia Vitamin D supplementation.  Would be great to get her some improvement in pain to that she could exercise on regular basis.  7. Rheumatoid arthritis, involving unspecified site, unspecified whether rheumatoid factor present ([place])  Does follow regularly with rheumatology.  She is taking Plaquenil regularly.  8. Iron deficiency anemia, unspecified iron deficiency anemia type Continue monitor blood counts.  She is not anemic, but ferritin stores were very low when last checked (01/29/2018 7.2).  We will check back with her because if not tolerating oral supplementation, could consider hematology evaluation.   Return in about 3 months (around 10/05/2020) for Chronic condition visit.   I discussed the assessment and treatment plan with the patient. The patient was provided an opportunity to ask questions and all were answered. The patient agreed with the plan and demonstrated an understanding of the instructions.   The patient was advised to call back or seek an in-person evaluation if the symptoms worsen or if the condition fails to improve as anticipated.  I provided 35 minutes of non-face-to-face time during this encounter.   [name], MD

## 2020-07-10 ENCOUNTER — Telehealth: Payer: Self-pay | Admitting: *Deleted

## 2020-07-10 ENCOUNTER — Telehealth: Payer: Self-pay

## 2020-07-10 NOTE — Telephone Encounter (Signed)
-----   Message from Caren Macadam, MD sent at 07/10/2020  7:55 AM EST ----- If she hasn't tried iron supplementation, I would recommend this at 325mg  daily. Can cause constipation. If she hasn't tolerated then would suggest referral to hematology for evaluation/treatment. Iron stores were very low when last checked. Even if she can tolerate supplement a few days/week; we can recheck in a few months time.

## 2020-07-10 NOTE — Telephone Encounter (Signed)
Noted  

## 2020-07-10 NOTE — Telephone Encounter (Signed)
Patient called in stating that the medication she is currently taking is making her dizzy and nauseas. She is wanting to know is there something she can take for this or do we need to find another medication.   Blood pressure medication    Please call and advise

## 2020-07-10 NOTE — Telephone Encounter (Signed)
Could she try breaking this in half if she hasn't already? Then let me know how she does with this? Also let me know how blood pressures are looking.

## 2020-07-10 NOTE — Telephone Encounter (Signed)
Patient informed of the message below and agreed to begin taking 1/2 of a pill.  Stated she has not taken her blood pressure in a few days, will begin checking this and call back with numbers.  Message sent to PCP.

## 2020-07-10 NOTE — Telephone Encounter (Signed)
Patient informed of the message below and agreed to begin taking an iron supplement.  Patient wanted to let Dr Ethlyn Gallery know she checked her BP earlier, reading was 143/80 and she is unable to cut Chlorthalidone in half with a pill cutter due to the small size so she took a full one today.  Message sent to PCP.

## 2020-07-11 ENCOUNTER — Other Ambulatory Visit: Payer: Self-pay | Admitting: Family Medicine

## 2020-07-11 NOTE — Telephone Encounter (Signed)
She could try taking the medication at night - everyone is a little different with how this diuretic affects them though; taking at night may mean she is up to the bathroom a lot, but some do well with this)  I do think she will adjust and dizziness will lessen as her body adjusts. I would just suggest staying well hydrated (which I know she does already)

## 2020-07-11 NOTE — Telephone Encounter (Signed)
Patient informed of the message below.

## 2020-07-11 NOTE — Telephone Encounter (Signed)
There isn't a smaller dose that is covered; so she could break what she can with the pill cutter (doesn't have to be exact - just trying to get a little off the pill so dose for her is lower. OR: if she wants to try and keep taking the whole tab; I would think that if she can do it for 1-2 weeks that she will adjust. That blood pressure is imprved, but much lower for her; so her body is trying to adjust to a new normal. If she doesn't feel she can tolerate the chlorthalidone let me know!

## 2020-07-11 NOTE — Telephone Encounter (Signed)
Patient informed of the message below.  Patient wanted to know if there is anything she can take for the dizziness?  Stated she has had vertigo and this feels different as she "feels wobbly when she gets up too quickly", feels funny when watching TV and does not feel normal.  Also has a history of sinus problems this time of year, complains of head, nasal and congestion in the right ear for the past 2 weeks.  Denies a headache or a sensation of the room spinning.  Message sent to PCP.

## 2020-07-25 ENCOUNTER — Other Ambulatory Visit (HOSPITAL_COMMUNITY): Payer: Self-pay | Admitting: Neurosurgery

## 2020-07-25 ENCOUNTER — Other Ambulatory Visit: Payer: Self-pay | Admitting: Family Medicine

## 2020-07-25 DIAGNOSIS — R600 Localized edema: Secondary | ICD-10-CM

## 2020-07-25 MED FILL — tiZANidine HCL 2 MG TABS: 2 | 15 days supply | Qty: 60 | Fill #0

## 2020-07-26 ENCOUNTER — Other Ambulatory Visit: Payer: Self-pay | Admitting: Family Medicine

## 2020-07-26 MED FILL — FUROSEMIDE 20 MG TABS: 20 | 33 days supply | Qty: 200 | Fill #0

## 2020-08-02 ENCOUNTER — Other Ambulatory Visit (HOSPITAL_COMMUNITY): Payer: Self-pay | Admitting: Physician Assistant

## 2020-08-02 MED FILL — sulfaSALAzine 500 MG TABS: 500 | 30 days supply | Qty: 120 | Fill #0

## 2020-08-03 ENCOUNTER — Other Ambulatory Visit: Payer: Self-pay | Admitting: Family Medicine

## 2020-08-03 MED FILL — METOPROLOL SUCCINATE ER 50: 50 | 90 days supply | Qty: 180 | Fill #0

## 2020-08-03 MED FILL — LOSARTAN POTASSIUM 100 MG T: 100 | 90 days supply | Qty: 90 | Fill #0

## 2020-08-15 MED FILL — NAPROXEN 500 MG TABS: 500 | 30 days supply | Qty: 60 | Fill #1

## 2020-08-29 ENCOUNTER — Other Ambulatory Visit: Payer: Self-pay | Admitting: Family Medicine

## 2020-08-29 DIAGNOSIS — R600 Localized edema: Secondary | ICD-10-CM

## 2020-08-29 MED FILL — FUROSEMIDE 20 MG TABS: 20 | 33 days supply | Qty: 200 | Fill #0

## 2020-08-29 MED FILL — sulfaSALAzine 500 MG TABS: 500 | 30 days supply | Qty: 120 | Fill #1

## 2020-09-15 MED FILL — HYDROXYCHLOROQUINE 200 MG T: 200 | 90 days supply | Qty: 180 | Fill #0

## 2020-09-18 ENCOUNTER — Other Ambulatory Visit (HOSPITAL_COMMUNITY): Payer: Self-pay | Admitting: Neurosurgery

## 2020-09-18 DIAGNOSIS — M5126 Other intervertebral disc displacement, lumbar region: Secondary | ICD-10-CM | POA: Diagnosis not present

## 2020-09-18 DIAGNOSIS — G629 Polyneuropathy, unspecified: Secondary | ICD-10-CM | POA: Diagnosis not present

## 2020-09-18 DIAGNOSIS — M5416 Radiculopathy, lumbar region: Secondary | ICD-10-CM | POA: Diagnosis not present

## 2020-09-18 DIAGNOSIS — R200 Anesthesia of skin: Secondary | ICD-10-CM | POA: Diagnosis not present

## 2020-09-18 DIAGNOSIS — M545 Low back pain, unspecified: Secondary | ICD-10-CM | POA: Diagnosis not present

## 2020-09-18 MED FILL — METHOCARBAMOL 500 MG TABS: 500 | 20 days supply | Qty: 60 | Fill #0

## 2020-09-19 ENCOUNTER — Other Ambulatory Visit (HOSPITAL_COMMUNITY): Payer: Self-pay | Admitting: Physician Assistant

## 2020-09-26 ENCOUNTER — Other Ambulatory Visit (HOSPITAL_COMMUNITY): Payer: Self-pay

## 2020-09-26 MED FILL — AMOXICILLIN 500 MG CAPSULE: 500 | 4 days supply | Qty: 16 | Fill #0

## 2020-09-27 MED FILL — GABAPENTIN 100 MG CAPSULE: 100 | 67 days supply | Qty: 400 | Fill #1

## 2020-09-27 MED FILL — NAPROXEN 500 MG TABS: 500 | 30 days supply | Qty: 60 | Fill #2

## 2020-10-05 DIAGNOSIS — M0609 Rheumatoid arthritis without rheumatoid factor, multiple sites: Secondary | ICD-10-CM | POA: Diagnosis not present

## 2020-10-05 DIAGNOSIS — Z79899 Other long term (current) drug therapy: Secondary | ICD-10-CM | POA: Diagnosis not present

## 2020-10-10 ENCOUNTER — Other Ambulatory Visit (HOSPITAL_COMMUNITY): Payer: Self-pay | Admitting: Physician Assistant

## 2020-10-10 MED FILL — sulfaSALAzine 500 MG TABS: 500 | 30 days supply | Qty: 120 | Fill #0

## 2020-10-12 ENCOUNTER — Other Ambulatory Visit: Payer: Self-pay | Admitting: Family Medicine

## 2020-10-12 DIAGNOSIS — R600 Localized edema: Secondary | ICD-10-CM

## 2020-10-12 MED FILL — CHLORTHALIDONE 25 MG TABS: 25 | 90 days supply | Qty: 90 | Fill #1

## 2020-10-12 MED FILL — FUROSEMIDE 20 MG TABS: 20 | 33 days supply | Qty: 200 | Fill #0

## 2020-10-26 DIAGNOSIS — B353 Tinea pedis: Secondary | ICD-10-CM | POA: Diagnosis not present

## 2020-10-26 DIAGNOSIS — M79675 Pain in left toe(s): Secondary | ICD-10-CM | POA: Diagnosis not present

## 2020-10-26 DIAGNOSIS — M79674 Pain in right toe(s): Secondary | ICD-10-CM | POA: Diagnosis not present

## 2020-10-26 DIAGNOSIS — B351 Tinea unguium: Secondary | ICD-10-CM | POA: Diagnosis not present

## 2020-10-26 DIAGNOSIS — M792 Neuralgia and neuritis, unspecified: Secondary | ICD-10-CM | POA: Diagnosis not present

## 2020-10-31 DIAGNOSIS — Z79899 Other long term (current) drug therapy: Secondary | ICD-10-CM | POA: Diagnosis not present

## 2020-11-01 ENCOUNTER — Other Ambulatory Visit (HOSPITAL_COMMUNITY): Payer: Self-pay | Admitting: Podiatry

## 2020-11-09 ENCOUNTER — Other Ambulatory Visit (HOSPITAL_COMMUNITY): Payer: Self-pay | Admitting: Physician Assistant

## 2020-11-09 MED FILL — NAPROXEN 500 MG TABS: 500 | 30 days supply | Qty: 60 | Fill #0

## 2020-11-09 MED FILL — sulfaSALAzine 500 MG TABS: 500 | 30 days supply | Qty: 120 | Fill #1

## 2020-11-17 ENCOUNTER — Other Ambulatory Visit: Payer: Self-pay | Admitting: Neurology

## 2020-11-17 ENCOUNTER — Encounter: Payer: Self-pay | Admitting: Neurology

## 2020-11-17 ENCOUNTER — Other Ambulatory Visit: Payer: Self-pay

## 2020-11-17 ENCOUNTER — Telehealth (INDEPENDENT_AMBULATORY_CARE_PROVIDER_SITE_OTHER): Payer: 59 | Admitting: Neurology

## 2020-11-17 VITALS — Ht 66.0 in | Wt 250.0 lb

## 2020-11-17 DIAGNOSIS — R202 Paresthesia of skin: Secondary | ICD-10-CM

## 2020-11-17 MED ORDER — GABAPENTIN 100 MG PO CAPS
ORAL_CAPSULE | ORAL | 3 refills | Status: DC
Start: 2020-11-17 — End: 2020-11-17

## 2020-11-17 NOTE — Progress Notes (Signed)
   Due to the COVID-19 crisis, this virtual check-in visit was done via telephone from my office and it was initiated and consent given by this patient and or family.   Telephone (Audio) Visit The purpose of this telephone visit is to provide medical care while limiting exposure to the novel coronavirus.    Consent was obtained for telephone visit and initiated by pt/family:  Yes.   Answered questions that patient had about telehealth interaction:  Yes.   I discussed the limitations, risks, security and privacy concerns of performing an evaluation and management service by telephone. I also discussed with the patient that there may be a patient responsible charge related to this service. The patient expressed understanding and agreed to proceed.  Pt location: Home Physician Location: office Name of referring provider:  Caren Macadam, MD I connected with .EMALEY APPLIN at patients initiation/request on 11/17/2020 at 10:50 AM EDT by telephone and verified that I am speaking with the correct person using two identifiers.  Pt MRN:  [id number] Pt DOB:  1950/09/17   History of Present Illness: This is a 70  Year-old female returning for follow-up of erythromelalgia and bilateral hand tingling.  She continues to have feet pain.  She takes gabapentin 300mg  in the morning and 200- 300mg  at bedtime, which tends to control her pain.  Over the past two days, however, her pain has been more uncomfortable.  She gets relief with ice, but is limited this due to fungal infection. She still has blisters on the feet and saw podiatry.  She was started on antifungal medication which has helped.  She will also be getting ultrasound of the legs.  Her PCP started her on naproxen which has also helped with pain.   She continues to have spells of numbness/tingling in the first three fingers.  Previously, EMG was declined, however, with her progressive symptoms, she would like to proceed with this.      Assessment and Plan:   1.  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 OK to take an extra tablet as needed  2.  Bilateral and paresthesias, most suggestive of carpal tunnel syndrome NCS/EMG bilateral arms  Follow Up Instructions:   I discussed the assessment and treatment plan with the patient. The patient was provided an opportunity to ask questions and all were answered. The patient agreed with the plan and demonstrated an understanding of the instructions.   The patient was advised to call back or seek an in-person evaluation if the symptoms worsen or if the condition fails to improve as anticipated.   Total Time spent in visit with the patient was: 20 min, of which 100% of the time was spent in counseling and/or coordinating care.   Pt understands and agrees with the plan of care outlined.     Alda Berthold, DO

## 2020-11-18 ENCOUNTER — Other Ambulatory Visit (HOSPITAL_COMMUNITY): Payer: Self-pay

## 2020-11-18 MED FILL — Gabapentin Cap 100 MG: ORAL | 90 days supply | Qty: 540 | Fill #0 | Status: CN

## 2020-11-19 ENCOUNTER — Other Ambulatory Visit (HOSPITAL_COMMUNITY): Payer: Self-pay

## 2020-11-22 ENCOUNTER — Other Ambulatory Visit (HOSPITAL_COMMUNITY): Payer: Self-pay

## 2020-11-22 ENCOUNTER — Other Ambulatory Visit: Payer: Self-pay | Admitting: Family Medicine

## 2020-11-22 DIAGNOSIS — R600 Localized edema: Secondary | ICD-10-CM

## 2020-11-22 MED ORDER — FUROSEMIDE 20 MG PO TABS
ORAL_TABLET | ORAL | 0 refills | Status: DC
  Filled 2020-11-22: qty 100, 17d supply, fill #0

## 2020-11-30 ENCOUNTER — Other Ambulatory Visit (HOSPITAL_COMMUNITY): Payer: Self-pay

## 2020-11-30 DIAGNOSIS — B351 Tinea unguium: Secondary | ICD-10-CM | POA: Diagnosis not present

## 2020-12-07 ENCOUNTER — Other Ambulatory Visit (HOSPITAL_COMMUNITY): Payer: Self-pay

## 2020-12-07 DIAGNOSIS — M792 Neuralgia and neuritis, unspecified: Secondary | ICD-10-CM | POA: Diagnosis not present

## 2020-12-07 DIAGNOSIS — L97512 Non-pressure chronic ulcer of other part of right foot with fat layer exposed: Secondary | ICD-10-CM | POA: Diagnosis not present

## 2020-12-07 DIAGNOSIS — L97521 Non-pressure chronic ulcer of other part of left foot limited to breakdown of skin: Secondary | ICD-10-CM | POA: Diagnosis not present

## 2020-12-07 DIAGNOSIS — I879 Disorder of vein, unspecified: Secondary | ICD-10-CM | POA: Diagnosis not present

## 2020-12-07 MED ORDER — CEPHALEXIN 500 MG PO CAPS
ORAL_CAPSULE | ORAL | 0 refills | Status: DC
  Filled 2020-12-07: qty 30, 10d supply, fill #0

## 2020-12-08 ENCOUNTER — Other Ambulatory Visit (HOSPITAL_COMMUNITY): Payer: Self-pay

## 2020-12-08 MED FILL — Sulfasalazine Tab 500 MG: ORAL | 30 days supply | Qty: 120 | Fill #0 | Status: AC

## 2020-12-12 ENCOUNTER — Other Ambulatory Visit: Payer: Self-pay | Admitting: Family Medicine

## 2020-12-12 ENCOUNTER — Other Ambulatory Visit (HOSPITAL_COMMUNITY): Payer: Self-pay

## 2020-12-12 ENCOUNTER — Other Ambulatory Visit (HOSPITAL_BASED_OUTPATIENT_CLINIC_OR_DEPARTMENT_OTHER): Payer: Self-pay

## 2020-12-12 DIAGNOSIS — R600 Localized edema: Secondary | ICD-10-CM

## 2020-12-12 MED ORDER — FUROSEMIDE 20 MG PO TABS
ORAL_TABLET | ORAL | 0 refills | Status: DC
  Filled 2020-12-12: qty 100, 16d supply, fill #0

## 2020-12-12 MED FILL — Gabapentin Cap 100 MG: ORAL | 90 days supply | Qty: 540 | Fill #0 | Status: AC

## 2020-12-12 MED FILL — Hydroxychloroquine Sulfate Tab 200 MG: ORAL | 90 days supply | Qty: 180 | Fill #0 | Status: AC

## 2020-12-15 ENCOUNTER — Other Ambulatory Visit (HOSPITAL_COMMUNITY): Payer: Self-pay

## 2020-12-16 ENCOUNTER — Other Ambulatory Visit (HOSPITAL_COMMUNITY): Payer: Self-pay

## 2020-12-21 DIAGNOSIS — I83024 Varicose veins of left lower extremity with ulcer of heel and midfoot: Secondary | ICD-10-CM | POA: Diagnosis not present

## 2020-12-21 DIAGNOSIS — I739 Peripheral vascular disease, unspecified: Secondary | ICD-10-CM | POA: Diagnosis not present

## 2020-12-21 DIAGNOSIS — L97521 Non-pressure chronic ulcer of other part of left foot limited to breakdown of skin: Secondary | ICD-10-CM | POA: Diagnosis not present

## 2020-12-21 DIAGNOSIS — L97512 Non-pressure chronic ulcer of other part of right foot with fat layer exposed: Secondary | ICD-10-CM | POA: Diagnosis not present

## 2020-12-21 DIAGNOSIS — I872 Venous insufficiency (chronic) (peripheral): Secondary | ICD-10-CM | POA: Diagnosis not present

## 2020-12-21 DIAGNOSIS — I879 Disorder of vein, unspecified: Secondary | ICD-10-CM | POA: Diagnosis not present

## 2020-12-21 DIAGNOSIS — I83025 Varicose veins of left lower extremity with ulcer other part of foot: Secondary | ICD-10-CM | POA: Diagnosis not present

## 2020-12-21 DIAGNOSIS — I83019 Varicose veins of right lower extremity with ulcer of unspecified site: Secondary | ICD-10-CM | POA: Diagnosis not present

## 2020-12-21 DIAGNOSIS — M792 Neuralgia and neuritis, unspecified: Secondary | ICD-10-CM | POA: Diagnosis not present

## 2020-12-25 ENCOUNTER — Ambulatory Visit (INDEPENDENT_AMBULATORY_CARE_PROVIDER_SITE_OTHER): Payer: 59 | Admitting: Otolaryngology

## 2020-12-25 ENCOUNTER — Other Ambulatory Visit (HOSPITAL_COMMUNITY): Payer: Self-pay

## 2020-12-25 MED ORDER — CEPHALEXIN 500 MG PO CAPS
ORAL_CAPSULE | ORAL | 1 refills | Status: DC
  Filled 2020-12-25: qty 30, 10d supply, fill #0

## 2020-12-29 ENCOUNTER — Other Ambulatory Visit: Payer: Self-pay

## 2020-12-29 ENCOUNTER — Ambulatory Visit (INDEPENDENT_AMBULATORY_CARE_PROVIDER_SITE_OTHER): Payer: 59 | Admitting: Otolaryngology

## 2020-12-29 VITALS — Temp 97.2°F

## 2020-12-29 DIAGNOSIS — H6123 Impacted cerumen, bilateral: Secondary | ICD-10-CM

## 2020-12-29 NOTE — Progress Notes (Signed)
HPI: [name] is a 70 y.o. female who presents for evaluation of wax buildup in her ears.  She has previously been cleaned regularly with Dr. Ernesto Rutherford about once a year.  She has small ear canals bilaterally..  Past Medical History:  Diagnosis Date  . Allergy   . Cataract    BILATERAL-REMOVED 2 YEARS AGO  . Erythromelalgia [facility])    followed by neuro Dr Posey Pronto , mgd on gabapentin , dx several years ago   . GERD (gastroesophageal reflux disease)   . Helicobacter pylori gastritis 10/05/2018  . Hx of colonic polyp - ssp 11/03/2014  . Hypercalcemia   . Hypertension   . Left maxillary fracture ([place]) 07/17/2017   fell down my stairs 2 year ago , deneis any metal in place nor difficulty with jax extension   . Neuropathy   . 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 Osteopenia 10/17/2011   DEXA 09/2007: -1.4 L fem; 10/2011: -1.2 L fem   . Osteoporosis   . PONV (postoperative nausea and vomiting)   . Pseudogout of foot   . Rheumatoid arthritis(714.0) dx 2010  . Shingles    hx of   . Tibial plateau fracture, right, closed, initial encounter 07/16/2017   Past Surgical History:  Procedure Laterality Date  . BREAST BIOPSY  1972  . Broken wrist  2010  . CATARACT EXTRACTION  03/2012   left  . COLONOSCOPY    . COSMETIC SURGERY    . FRACTURE SURGERY    . INNER EAR SURGERY     busted ear drum  . MAXIMUM ACCESS (MAS)POSTERIOR LUMBAR INTERBODY FUSION (PLIF) 1 LEVEL N/A 10/11/2016   Procedure: Lumbar one-Sacral one Maximum access posterior lumbar interbody fusion;  Surgeon: Erline Levine, MD;  Location: [facility];  Service: Neurosurgery;  Laterality: N/A;  . ORIF WRIST FRACTURE Right 07/18/2017   Procedure: OPEN REDUCTION INTERNAL FIXATION (ORIF) WRIST FRACTURE;  Surgeon: Renette Butters, MD;  Location: [facility];  Service: Orthopedics;  Laterality: Right;  . PARATHYROIDECTOMY Left 04/12/2019   Procedure: LEFT SUPERIOR PARATHYROIDECTOMY;  Surgeon: [name], MD;  Location: [facility];  Service: General;   Laterality: Left;  . pneumonia  2007   Social History   Socioeconomic History  . Marital status: Married    Spouse name: Not on file  . Number of children: 3  . Years of education: Not on file  . Highest education level: Not on file  Occupational History  . Occupation: Retired  Tobacco Use  . Smoking status: Former Smoker    Packs/day: 4.00    Years: 4.00    Pack years: 16.00    Types: Cigarettes    Start date: 29    Quit date: 1985    Years since quitting: 37.3  . Smokeless tobacco: Never Used  Vaping Use  . Vaping Use: Never used  Substance and Sexual Activity  . Alcohol use: No    Alcohol/week: 0.0 standard drinks  . Drug use: No  . Sexual activity: Not on file  Other Topics Concern  . Not on file  Social History Narrative   Artist -retired Building control surveyor   Married, lives with spouse, [name], he is IT support for [employer]   3 sons   2 caffeinated beverages a day   No regular exercise, diet is ok   Right handed   Social Determinants of Health   Financial Resource Strain: Not on file  Food Insecurity: Not on file  Transportation Needs: Not on  file  Physical Activity: Not on file  Stress: Not on file  Social Connections: Not on file   Family History  Problem Relation Age of Onset  . Stroke Mother   . Hypertension Father   . Heart attack Father   . Hypertension Sister        x 3  . Hypertension Brother   . Hyperthyroidism Sister        x2, s/p RAI ablation  . Hypothyroidism Brother   . Colon cancer Neg Hx   . Esophageal cancer Neg Hx   . Rectal cancer Neg Hx   . Stomach cancer Neg Hx    Allergies  Allergen Reactions  . Amlodipine Besylate Other (See Comments)    Tremors  . Depakote [Divalproex Sodium] Swelling  . Nortriptyline Other (See Comments)    tremors  . Dilaudid [Hydromorphone Hcl]     Headache, muscle tightness   Prior to Admission medications   Medication Sig Start Date End Date Taking? Authorizing Provider   acetaminophen (TYLENOL) 650 MG CR tablet Take 650 mg by mouth every 8 (eight) hours as needed for pain.    [provider]  amoxicillin (AMOXIL) 500 MG capsule TAKE 4 CAPSULES BY MOUTH ONE HOUR PRIOR TO APPOINTMENT 09/26/20 09/26/21    aspirin 81 MG tablet Take 81 mg by mouth at bedtime.     [provider]  cephALEXin (KEFLEX) 500 MG capsule Take 1 capsule by mouth three times daily for 10 days 12/24/20     chlorthalidone (HYGROTON) 25 MG tablet TAKE 1 TABLET BY MOUTH DAILY. 03/22/20 03/22/21  Caren Macadam, MD  Cholecalciferol (VITAMIN D) 125 MCG (5000 UT) CAPS Take 5,000 Units by mouth daily.    [provider]  diclofenac sodium (VOLTAREN) 1 % GEL Apply 2 g topically 4 (four) times daily as needed (pain).    [provider]  Fexofenadine HCl (MUCINEX ALLERGY PO) Take by mouth daily as needed.    [provider]  furosemide (LASIX) 20 MG tablet TAKE 2 TO 3 TABLETS BY MOUTH 2 TIMES DAILY AS NEEDED FOR EDEMA * NEEDS APPT 12/12/20 12/12/21  Caren Macadam, MD  gabapentin (NEURONTIN) 100 MG capsule TAKE 3 CAPSULES IN THE MORNING AND 2 CAPSULES AT BEDTIME. OK TO TAKE EXTRA CAPSULE AT BEDTIME AS NEEDED 11/17/20 11/17/21  Narda Amber K, DO  hydroxychloroquine (PLAQUENIL) 200 MG tablet Take 200 mg by mouth 2 (two) times daily.     [provider]  hydroxychloroquine (PLAQUENIL) 200 MG tablet TAKE 1 TABLET BY MOUTH 2 TIMES DAILY WITH FOOD OR MILK 09/19/20 09/19/21  Bjorn Pippin, PA-C  hydroxychloroquine (PLAQUENIL) 200 MG tablet TAKE 1 TABLET BY MOUTH TWICE DAILY WITH FOOD OR MILK 06/16/20 06/16/21  Bjorn Pippin, PA-C  hydroxychloroquine (PLAQUENIL) 200 MG tablet TAKE 1 TABLET BY MOUTH TWICE DAILY WITH FOOD OR MILK 06/16/20 06/16/21  Bjorn Pippin, PA-C  hydroxypropyl methylcellulose / hypromellose (ISOPTO TEARS / GONIOVISC) 2.5 % ophthalmic solution Place 1 drop into both eyes daily.    [provider]  ketoconazole (NIZORAL) 2 % cream  Apply 1 application topically daily. 03/17/20   Caren Macadam, MD  lidocaine (XYLOCAINE) 5 % ointment Apply sparingly up to TID for pain 07/30/19   Koberlein, Andris Flurry C, MD  losartan (COZAAR) 100 MG tablet TAKE 1 TABLET BY MOUTH ONCE A DAY 08/03/20 08/03/21  Koberlein, Andris Flurry C, MD  methocarbamol (ROBAXIN) 500 MG tablet TAKE 1 TABLET BY MOUTH 3 TIMES DAILY AS  NEEDED 09/18/20 09/18/21  Erline Levine, MD  metoprolol succinate (TOPROL-XL) 50 MG 24 hr tablet TAKE 1 TABLET BY MOUTH TWICE DAILY 08/03/20 08/03/21  Caren Macadam, MD  mupirocin ointment (BACTROBAN) 2 % Apply 1 application topically 2 (two) times daily. 05/21/19   Caren Macadam, MD  naproxen (NAPROSYN) 500 MG tablet SMARTSIG:1 Tablet(s) By Mouth Every 12 Hours PRN Patient not taking: Reported on 11/17/2020 06/15/20   [provider]  naproxen (NAPROSYN) 500 MG tablet Take 500 mg by mouth 2 (two) times daily with a meal.    [provider]  naproxen (NAPROSYN) 500 MG tablet TAKE 1 TABLET BY MOUTH EVERY 12 HOURS WITH FOOD OR MILK AS NEEDED. 11/09/20 11/09/21  Bjorn Pippin, PA-C  naproxen (NAPROSYN) 500 MG tablet TAKE 1 TABLET BY MOUTH EVERY 12 HOURS AS NEEDED WITH FOOD OR MILK 06/15/20 06/15/21  Bjorn Pippin, PA-C  sulfaSALAzine (AZULFIDINE) 500 MG EC tablet TAKE 2 TABLETS BY MOUTH TWICE DAILY Patient taking differently: Take 1,000 mg by mouth 2 (two) times daily. 07/17/16   Bo Merino, MD  sulfaSALAzine (AZULFIDINE) 500 MG tablet TAKE 2 TABLETS BY MOUTH 2 TIMES DAILY 10/10/20 10/10/21  Bjorn Pippin, PA-C  sulfaSALAzine (AZULFIDINE) 500 MG tablet TAKE 2 TABLETS BY MOUTH 2 TIMES DAILY 08/02/20 08/02/21  Bjorn Pippin, PA-C  sulfaSALAzine (AZULFIDINE) 500 MG tablet TAKE 2 TABLETS BY MOUTH TWICE DAILY 12/10/19 12/09/20  Bjorn Pippin, PA-C  terbinafine (LAMISIL) 250 MG tablet Take 250 mg by mouth daily. 11/01/20   [provider]  terbinafine (LAMISIL) 250 MG tablet TAKE 1 TABLET BY  MOUTH ONCE DAILY 11/01/20 11/01/21  Rosemary Holms, DPM  tiZANidine (ZANAFLEX) 2 MG tablet Take 2 mg by mouth every 8 (eight) hours as needed for muscle spasms.    [provider]  tiZANidine (ZANAFLEX) 2 MG tablet TAKE 1 TABLET BY MOUTH EVERY 6 TO 8 HOURS AS NEEDED FOR MUSCLE SPASMS 07/25/20 07/25/21  Erline Levine, MD     Positive ROS: Otherwise negative  All other systems have been reviewed and were otherwise negative with the exception of those mentioned in the HPI and as above.  Physical Exam: Constitutional: Alert, well-appearing, no acute distress Ears: External ears without lesions or tenderness. Ear canals mod amount of wax buildup in both ear canals which are small.  This was cleaned with suction and curettes.  TMs were clear bilaterally.. Nasal: External nose without lesions. Clear nasal passages Oral: Oropharynx clear. Neck: No palpable adenopathy or masses Respiratory: Breathing comfortably  Skin: No facial/neck lesions or rash noted.  Cerumen impaction removal  Date/Time: 12/29/2020 3:17 PM Performed by: Rozetta Nunnery, MD Authorized by: Rozetta Nunnery, MD   Consent:    Consent obtained:  Verbal   Consent given by:  Patient   Risks discussed:  Pain and bleeding Procedure details:    Location:  L ear and R ear   Procedure type: curette and suction   Post-procedure details:    Inspection:  TM intact and canal normal   Hearing quality:  Improved   Patient tolerance of procedure:  Tolerated well, no immediate complications Comments:     TMs are clear bilaterally    Assessment: Bilateral cerumen impactions in patient with small ear canals.  Plan: She will follow-up on a yearly basis to have her ears cleaned.  Radene Journey, MD

## 2021-01-03 ENCOUNTER — Other Ambulatory Visit (HOSPITAL_COMMUNITY): Payer: Self-pay

## 2021-01-03 DIAGNOSIS — M5136 Other intervertebral disc degeneration, lumbar region: Secondary | ICD-10-CM | POA: Diagnosis not present

## 2021-01-03 DIAGNOSIS — M0609 Rheumatoid arthritis without rheumatoid factor, multiple sites: Secondary | ICD-10-CM | POA: Diagnosis not present

## 2021-01-03 DIAGNOSIS — M5441 Lumbago with sciatica, right side: Secondary | ICD-10-CM | POA: Diagnosis not present

## 2021-01-03 DIAGNOSIS — I7381 Erythromelalgia: Secondary | ICD-10-CM | POA: Diagnosis not present

## 2021-01-03 DIAGNOSIS — M2550 Pain in unspecified joint: Secondary | ICD-10-CM | POA: Diagnosis not present

## 2021-01-03 DIAGNOSIS — Z6841 Body Mass Index (BMI) 40.0 and over, adult: Secondary | ICD-10-CM | POA: Diagnosis not present

## 2021-01-03 MED ORDER — SULFASALAZINE 500 MG PO TABS
ORAL_TABLET | ORAL | 1 refills | Status: DC
  Filled 2021-01-03: qty 360, 90d supply, fill #0
  Filled 2021-04-11: qty 360, 90d supply, fill #1

## 2021-01-03 MED ORDER — HYDROXYCHLOROQUINE SULFATE 200 MG PO TABS
ORAL_TABLET | ORAL | 1 refills | Status: DC
  Filled 2021-01-03 – 2021-03-22 (×2): qty 180, 90d supply, fill #0
  Filled 2021-06-20: qty 180, 90d supply, fill #1

## 2021-01-03 MED ORDER — NAPROXEN 500 MG PO TABS
ORAL_TABLET | ORAL | 2 refills | Status: DC
  Filled 2021-01-03: qty 60, 30d supply, fill #0
  Filled 2021-02-26: qty 60, 30d supply, fill #1
  Filled 2021-04-03: qty 60, 30d supply, fill #2

## 2021-01-04 ENCOUNTER — Other Ambulatory Visit (HOSPITAL_COMMUNITY): Payer: Self-pay

## 2021-01-04 ENCOUNTER — Other Ambulatory Visit: Payer: Self-pay | Admitting: Family Medicine

## 2021-01-04 DIAGNOSIS — I879 Disorder of vein, unspecified: Secondary | ICD-10-CM | POA: Diagnosis not present

## 2021-01-04 DIAGNOSIS — R600 Localized edema: Secondary | ICD-10-CM

## 2021-01-04 DIAGNOSIS — L97512 Non-pressure chronic ulcer of other part of right foot with fat layer exposed: Secondary | ICD-10-CM | POA: Diagnosis not present

## 2021-01-04 DIAGNOSIS — L97521 Non-pressure chronic ulcer of other part of left foot limited to breakdown of skin: Secondary | ICD-10-CM | POA: Diagnosis not present

## 2021-01-04 DIAGNOSIS — B351 Tinea unguium: Secondary | ICD-10-CM | POA: Diagnosis not present

## 2021-01-04 DIAGNOSIS — Z79899 Other long term (current) drug therapy: Secondary | ICD-10-CM | POA: Diagnosis not present

## 2021-01-05 ENCOUNTER — Other Ambulatory Visit (HOSPITAL_COMMUNITY): Payer: Self-pay

## 2021-01-05 MED ORDER — FUROSEMIDE 20 MG PO TABS
ORAL_TABLET | ORAL | 0 refills | Status: DC
  Filled 2021-01-05: qty 100, 17d supply, fill #0

## 2021-01-06 ENCOUNTER — Other Ambulatory Visit (HOSPITAL_COMMUNITY): Payer: Self-pay

## 2021-01-06 MED ORDER — TERBINAFINE HCL 250 MG PO TABS
250.0000 mg | ORAL_TABLET | Freq: Every day | ORAL | 0 refills | Status: DC
  Filled 2021-01-06: qty 30, 30d supply, fill #0

## 2021-01-08 ENCOUNTER — Other Ambulatory Visit (HOSPITAL_COMMUNITY): Payer: Self-pay

## 2021-01-08 ENCOUNTER — Other Ambulatory Visit: Payer: Self-pay | Admitting: Family Medicine

## 2021-01-08 MED ORDER — CHLORTHALIDONE 25 MG PO TABS
ORAL_TABLET | Freq: Every day | ORAL | 0 refills | Status: DC
Start: 2021-01-08 — End: 2021-04-09
  Filled 2021-01-08: qty 90, 90d supply, fill #0

## 2021-01-10 ENCOUNTER — Telehealth: Payer: Self-pay | Admitting: Family Medicine

## 2021-01-10 ENCOUNTER — Telehealth (INDEPENDENT_AMBULATORY_CARE_PROVIDER_SITE_OTHER): Payer: 59 | Admitting: Family Medicine

## 2021-01-10 ENCOUNTER — Encounter: Payer: Self-pay | Admitting: Family Medicine

## 2021-01-10 VITALS — BP 127/71 | HR 65

## 2021-01-10 DIAGNOSIS — M069 Rheumatoid arthritis, unspecified: Secondary | ICD-10-CM

## 2021-01-10 DIAGNOSIS — E213 Hyperparathyroidism, unspecified: Secondary | ICD-10-CM | POA: Diagnosis not present

## 2021-01-10 DIAGNOSIS — Z1231 Encounter for screening mammogram for malignant neoplasm of breast: Secondary | ICD-10-CM

## 2021-01-10 DIAGNOSIS — I7381 Erythromelalgia: Secondary | ICD-10-CM | POA: Diagnosis not present

## 2021-01-10 DIAGNOSIS — I10 Essential (primary) hypertension: Secondary | ICD-10-CM

## 2021-01-10 DIAGNOSIS — M810 Age-related osteoporosis without current pathological fracture: Secondary | ICD-10-CM | POA: Diagnosis not present

## 2021-01-10 NOTE — Telephone Encounter (Signed)
Spoke with the pt regarding virtual visit for today.

## 2021-01-10 NOTE — Progress Notes (Signed)
Virtual Visit via Video Note  I connected with [name]  on 01/10/21 at  1:30 PM EDT by a video enabled telemedicine application and verified that I am speaking with the correct person using two identifiers.  Location patient: home Location provider: [place], [place] [address] Persons participating in the virtual visit: patient, provider  I discussed the limitations of evaluation and management by telemedicine and the availability of in person appointments. The patient expressed understanding and agreed to proceed.   [name] DOB: 13-Dec-1950 Encounter date: 01/10/2021  This is a 70 y.o. female who presents with Chief Complaint  Patient presents with  . Follow-up    History of present illness: Last visit with me was 6 months ago.  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  At last visit we referred her to pain management to see if there are alternative treatments (like nerve blocks or epidurals) to help with her ongoing leg pain. Did get different chair where she can get legs up above heart which is helping. Was told she had infection; podiatry has had her on 2 courses of antibiotics. She is seeing provider at [facility] because blisters were so bad on feet. Just finished second round of antibiotics - feels that feet are clearing up and swelling has gone down. Also had [place] on legs - was told that she had venous insufficiency bilat. They are getting her sent to doc to "kill" veins that aren't working. They are thinking that this is going to help stop some of pain. Pain is not as bad in feet/legs. Heat in legs got better with antibiotics as well (keflex). Still hurt but nothing like they were. [facility] - Dr. Pollie Friar is who she will be seeing.   Hyperparathyroidism: Follows with endocrinology.  Hypertension: Agreed to start taking chlorthalidone after last visit, especially since blood pressure is remaining  high.  I asked her to continue with losartan 100 mg and metoprolol 50 mg. She is tolerating this well.   Osteopenia: Does take vitamin D.  Limited with exercise secondary to leg pain. Last DEXA was 12/2017. Scores were decreased at that time. Suggested repeat DEXA in 1 year. Does still take this on and off. Sometimes due to pill burden she will skip.   Rheumatoid arthritis: Does follow regularly with rheumatology.  Taking Plaquenil. Lower back has been hurting badly. Follows with michelle with [place] [facility].  Hyperlipidemia: Has been hesitant to try statin due to worry of increased pain with this.  Hyperglycemia: Blood sugar has been stable.  Iron deficiency noted on last blood work.  Patient was encouraged to take iron supplement. She forgot about this.   Repeat colonoscopy due 09/2023 due to polyp.   Allergies  Allergen Reactions  . Amlodipine Besylate Other (See Comments)    Tremors  . Depakote [Divalproex Sodium] Swelling  . Nortriptyline Other (See Comments)    tremors  . Dilaudid [Hydromorphone Hcl]     Headache, muscle tightness   Current Meds  Medication Sig  . acetaminophen (TYLENOL) 650 MG CR tablet Take 650 mg by mouth every 8 (eight) hours as needed for pain.  Marland Kitchen aspirin 81 MG tablet Take 81 mg by mouth at bedtime.   . chlorthalidone (HYGROTON) 25 MG tablet TAKE 1 TABLET BY MOUTH DAILY.  Marland Kitchen Cholecalciferol (VITAMIN D) 125 MCG (5000 UT) CAPS Take 5,000 Units by mouth daily.  . diclofenac sodium (VOLTAREN) 1 % GEL  Apply 2 g topically 4 (four) times daily as needed (pain).  . Fexofenadine HCl (MUCINEX ALLERGY PO) Take by mouth daily as needed.  . furosemide (LASIX) 20 MG tablet TAKE 2 TO 3 TABLETS BY MOUTH 2 TIMES DAILY AS NEEDED FOR EDEMA * NEEDS APPT  . gabapentin (NEURONTIN) 100 MG capsule TAKE 3 CAPSULES IN THE MORNING AND 2 CAPSULES AT BEDTIME. OK TO TAKE EXTRA CAPSULE AT BEDTIME AS NEEDED  . hydroxychloroquine (PLAQUENIL) 200 MG tablet Take 1 tablet by mouth  with food or milk twice a day  . hydroxypropyl methylcellulose / hypromellose (ISOPTO TEARS / GONIOVISC) 2.5 % ophthalmic solution Place 1 drop into both eyes daily.  Marland Kitchen losartan (COZAAR) 100 MG tablet TAKE 1 TABLET BY MOUTH ONCE A DAY  . methocarbamol (ROBAXIN) 500 MG tablet TAKE 1 TABLET BY MOUTH 3 TIMES DAILY AS NEEDED  . metoprolol succinate (TOPROL-XL) 50 MG 24 hr tablet TAKE 1 TABLET BY MOUTH TWICE DAILY  . naproxen (NAPROSYN) 500 MG tablet Take 1 tablet by mouth with food or milk every 12 hours as needed  . sulfaSALAzine (AZULFIDINE) 500 MG tablet Take 2 tablets by mouth  twice a day  . tiZANidine (ZANAFLEX) 2 MG tablet TAKE 1 TABLET BY MOUTH EVERY 6 TO 8 HOURS AS NEEDED FOR MUSCLE SPASMS  . [DISCONTINUED] amoxicillin (AMOXIL) 500 MG capsule TAKE 4 CAPSULES BY MOUTH ONE HOUR PRIOR TO APPOINTMENT  . [DISCONTINUED] cephALEXin (KEFLEX) 500 MG capsule Take 1 capsule by mouth three times daily for 10 days  . [DISCONTINUED] hydroxychloroquine (PLAQUENIL) 200 MG tablet Take 200 mg by mouth 2 (two) times daily.   . [DISCONTINUED] hydroxychloroquine (PLAQUENIL) 200 MG tablet TAKE 1 TABLET BY MOUTH 2 TIMES DAILY WITH FOOD OR MILK  . [DISCONTINUED] hydroxychloroquine (PLAQUENIL) 200 MG tablet TAKE 1 TABLET BY MOUTH TWICE DAILY WITH FOOD OR MILK  . [DISCONTINUED] hydroxychloroquine (PLAQUENIL) 200 MG tablet TAKE 1 TABLET BY MOUTH TWICE DAILY WITH FOOD OR MILK  . [DISCONTINUED] ketoconazole (NIZORAL) 2 % cream Apply 1 application topically daily.  . [DISCONTINUED] lidocaine (XYLOCAINE) 5 % ointment Apply sparingly up to TID for pain  . [DISCONTINUED] mupirocin ointment (BACTROBAN) 2 % Apply 1 application topically 2 (two) times daily.  . [DISCONTINUED] naproxen (NAPROSYN) 500 MG tablet   . [DISCONTINUED] naproxen (NAPROSYN) 500 MG tablet Take 500 mg by mouth 2 (two) times daily with a meal.  . [DISCONTINUED] naproxen (NAPROSYN) 500 MG tablet TAKE 1 TABLET BY MOUTH EVERY 12 HOURS WITH FOOD OR MILK AS  NEEDED.  . [DISCONTINUED] naproxen (NAPROSYN) 500 MG tablet TAKE 1 TABLET BY MOUTH EVERY 12 HOURS AS NEEDED WITH FOOD OR MILK  . [DISCONTINUED] sulfaSALAzine (AZULFIDINE) 500 MG EC tablet TAKE 2 TABLETS BY MOUTH TWICE DAILY (Patient taking differently: Take 1,000 mg by mouth 2 (two) times daily.)  . [DISCONTINUED] sulfaSALAzine (AZULFIDINE) 500 MG tablet TAKE 2 TABLETS BY MOUTH 2 TIMES DAILY  . [DISCONTINUED] terbinafine (LAMISIL) 250 MG tablet Take 250 mg by mouth daily.  . [DISCONTINUED] terbinafine (LAMISIL) 250 MG tablet TAKE 1 TABLET BY MOUTH ONCE DAILY  . [DISCONTINUED] terbinafine (LAMISIL) 250 MG tablet Take 1 tablet (250 mg total) by mouth daily.  . [DISCONTINUED] tiZANidine (ZANAFLEX) 2 MG tablet Take 2 mg by mouth every 8 (eight) hours as needed for muscle spasms.    Review of Systems  Constitutional: Negative for chills, fatigue and fever.  Respiratory: Negative for cough, chest tightness, shortness of breath and wheezing.   Cardiovascular: Negative for chest pain,  palpitations and leg swelling.    Objective:  BP 127/71   Pulse 65       BP Readings from Last 3 Encounters:  01/10/21 127/71  07/05/20 (!) 162/89  07/30/19 (!) 150/90   Wt Readings from Last 3 Encounters:  11/17/20 250 lb (113.4 kg)  07/05/20 257 lb (116.6 kg)  07/30/19 260 lb 3.2 oz (118 kg)    EXAM:  GENERAL: alert, oriented, appears well and in no acute distress  HEENT: atraumatic, conjunctiva clear, no obvious abnormalities on inspection of external nose and ears  NECK: normal movements of the head and neck  LUNGS: on inspection no signs of respiratory distress, breathing rate appears normal, no obvious gross SOB, gasping or wheezing  CV: no obvious cyanosis  MS: moves all visible extremities without noticeable abnormality  PSYCH/NEURO: pleasant and cooperative, no obvious depression or anxiety, speech and thought processing grossly intact   Assessment/Plan  1. Primary hypertension Well  controlled. Continue with current medications.   2. Erythromelalgia ([place]) Doing MUCH better. Swelling, blisters and pain are better. She has follow up with vascular scheduled for vein treatment.   3. Hyperparathyroidism ([place]) Has followed with endocrinology. We will check labwork she has had through specialists.   4. Rheumatoid arthritis, involving unspecified site, unspecified whether rheumatoid factor present [facility]) Follows with rheumatology regularly; continue with plaquenil.   5. Osteoporosis, unspecified osteoporosis type, unspecified pathological fracture presence - DG Bone Density; Future  6. Encounter for screening mammogram for malignant neoplasm of breast - MM Digital Screening; Future   Return in about 3 months (around 04/12/2021) for physical exam.   I discussed the assessment and treatment plan with the patient. The patient was provided an opportunity to ask questions and all were answered. The patient agreed with the plan and demonstrated an understanding of the instructions.   The patient was advised to call back or seek an in-person evaluation if the symptoms worsen or if the condition fails to improve as anticipated.  I provided 30 minutes of non-face-to-face time during this encounter.   [name], MD

## 2021-01-10 NOTE — Telephone Encounter (Signed)
Pt call and stated she is returning your call and want a call back. 

## 2021-01-11 ENCOUNTER — Telehealth: Payer: Self-pay | Admitting: *Deleted

## 2021-01-11 NOTE — Telephone Encounter (Signed)
Spoke with Magda Paganini at the number below and she stated the records will be sent later today.

## 2021-01-11 NOTE — Telephone Encounter (Signed)
-----   Message from Caren Macadam, MD sent at 01/10/2021  2:04 PM EDT ----- Can you see if [facility] will send over records, bloodwork from her recent visits (she thinks it is dr. Rosemary Holms - [phone] is their phone number

## 2021-01-21 DIAGNOSIS — I872 Venous insufficiency (chronic) (peripheral): Secondary | ICD-10-CM | POA: Diagnosis not present

## 2021-01-21 DIAGNOSIS — I83813 Varicose veins of bilateral lower extremities with pain: Secondary | ICD-10-CM | POA: Diagnosis not present

## 2021-01-22 DIAGNOSIS — I872 Venous insufficiency (chronic) (peripheral): Secondary | ICD-10-CM | POA: Diagnosis not present

## 2021-01-23 ENCOUNTER — Other Ambulatory Visit (HOSPITAL_COMMUNITY): Payer: Self-pay

## 2021-01-23 ENCOUNTER — Encounter: Payer: 59 | Admitting: Neurology

## 2021-01-23 ENCOUNTER — Other Ambulatory Visit: Payer: Self-pay | Admitting: Family Medicine

## 2021-01-23 DIAGNOSIS — R600 Localized edema: Secondary | ICD-10-CM

## 2021-01-23 MED ORDER — METOPROLOL SUCCINATE ER 50 MG PO TB24
ORAL_TABLET | Freq: Two times a day (BID) | ORAL | 0 refills | Status: DC
Start: 2021-01-23 — End: 2021-04-30
  Filled 2021-01-23: qty 180, 90d supply, fill #0

## 2021-01-23 MED ORDER — FUROSEMIDE 20 MG PO TABS
ORAL_TABLET | ORAL | 0 refills | Status: DC
  Filled 2021-01-23: qty 100, 16d supply, fill #0

## 2021-01-26 ENCOUNTER — Other Ambulatory Visit: Payer: 59

## 2021-02-05 ENCOUNTER — Other Ambulatory Visit (HOSPITAL_COMMUNITY): Payer: Self-pay

## 2021-02-05 ENCOUNTER — Other Ambulatory Visit: Payer: Self-pay | Admitting: Family Medicine

## 2021-02-05 DIAGNOSIS — L97521 Non-pressure chronic ulcer of other part of left foot limited to breakdown of skin: Secondary | ICD-10-CM | POA: Diagnosis not present

## 2021-02-05 DIAGNOSIS — B351 Tinea unguium: Secondary | ICD-10-CM | POA: Diagnosis not present

## 2021-02-05 DIAGNOSIS — L97512 Non-pressure chronic ulcer of other part of right foot with fat layer exposed: Secondary | ICD-10-CM | POA: Diagnosis not present

## 2021-02-05 DIAGNOSIS — I879 Disorder of vein, unspecified: Secondary | ICD-10-CM | POA: Diagnosis not present

## 2021-02-05 MED ORDER — LOSARTAN POTASSIUM 100 MG PO TABS
1.0000 | ORAL_TABLET | Freq: Every day | ORAL | 1 refills | Status: DC
  Filled 2021-02-05: qty 90, 90d supply, fill #0
  Filled 2021-05-02: qty 90, 90d supply, fill #1

## 2021-02-06 ENCOUNTER — Other Ambulatory Visit (HOSPITAL_COMMUNITY): Payer: Self-pay

## 2021-02-06 MED ORDER — TERBINAFINE HCL 250 MG PO TABS
ORAL_TABLET | ORAL | 0 refills | Status: DC
  Filled 2021-02-06: qty 30, 30d supply, fill #0

## 2021-02-08 ENCOUNTER — Other Ambulatory Visit: Payer: Self-pay | Admitting: Family Medicine

## 2021-02-08 ENCOUNTER — Other Ambulatory Visit (HOSPITAL_COMMUNITY): Payer: Self-pay

## 2021-02-08 DIAGNOSIS — R600 Localized edema: Secondary | ICD-10-CM

## 2021-02-08 MED ORDER — FUROSEMIDE 20 MG PO TABS
ORAL_TABLET | ORAL | 0 refills | Status: DC
  Filled 2021-02-08: qty 100, 16d supply, fill #0

## 2021-02-26 ENCOUNTER — Other Ambulatory Visit (HOSPITAL_COMMUNITY): Payer: Self-pay

## 2021-02-28 ENCOUNTER — Encounter: Payer: 59 | Admitting: Neurology

## 2021-03-03 DIAGNOSIS — I872 Venous insufficiency (chronic) (peripheral): Secondary | ICD-10-CM | POA: Diagnosis not present

## 2021-03-04 ENCOUNTER — Other Ambulatory Visit: Payer: Self-pay | Admitting: Family Medicine

## 2021-03-04 DIAGNOSIS — I872 Venous insufficiency (chronic) (peripheral): Secondary | ICD-10-CM | POA: Diagnosis not present

## 2021-03-04 DIAGNOSIS — R600 Localized edema: Secondary | ICD-10-CM

## 2021-03-05 ENCOUNTER — Other Ambulatory Visit (HOSPITAL_COMMUNITY): Payer: Self-pay

## 2021-03-05 DIAGNOSIS — I879 Disorder of vein, unspecified: Secondary | ICD-10-CM | POA: Diagnosis not present

## 2021-03-05 DIAGNOSIS — L97521 Non-pressure chronic ulcer of other part of left foot limited to breakdown of skin: Secondary | ICD-10-CM | POA: Diagnosis not present

## 2021-03-05 DIAGNOSIS — L97512 Non-pressure chronic ulcer of other part of right foot with fat layer exposed: Secondary | ICD-10-CM | POA: Diagnosis not present

## 2021-03-05 DIAGNOSIS — B351 Tinea unguium: Secondary | ICD-10-CM | POA: Diagnosis not present

## 2021-03-05 MED ORDER — FUROSEMIDE 20 MG PO TABS
ORAL_TABLET | ORAL | 0 refills | Status: DC
  Filled 2021-03-05: qty 100, 16d supply, fill #0

## 2021-03-18 DIAGNOSIS — I872 Venous insufficiency (chronic) (peripheral): Secondary | ICD-10-CM | POA: Diagnosis not present

## 2021-03-19 DIAGNOSIS — I872 Venous insufficiency (chronic) (peripheral): Secondary | ICD-10-CM | POA: Diagnosis not present

## 2021-03-22 ENCOUNTER — Other Ambulatory Visit (HOSPITAL_COMMUNITY): Payer: Self-pay

## 2021-03-23 ENCOUNTER — Other Ambulatory Visit (HOSPITAL_COMMUNITY): Payer: Self-pay

## 2021-03-23 ENCOUNTER — Other Ambulatory Visit: Payer: Self-pay | Admitting: Family Medicine

## 2021-03-23 DIAGNOSIS — R600 Localized edema: Secondary | ICD-10-CM

## 2021-03-23 MED ORDER — FUROSEMIDE 20 MG PO TABS
ORAL_TABLET | ORAL | 0 refills | Status: DC
  Filled 2021-03-23: qty 100, 16d supply, fill #0

## 2021-03-27 ENCOUNTER — Encounter: Payer: 59 | Admitting: Neurology

## 2021-04-03 ENCOUNTER — Other Ambulatory Visit (HOSPITAL_COMMUNITY): Payer: Self-pay

## 2021-04-03 MED FILL — Gabapentin Cap 100 MG: ORAL | 90 days supply | Qty: 540 | Fill #1 | Status: AC

## 2021-04-05 DIAGNOSIS — B353 Tinea pedis: Secondary | ICD-10-CM | POA: Diagnosis not present

## 2021-04-05 DIAGNOSIS — M792 Neuralgia and neuritis, unspecified: Secondary | ICD-10-CM | POA: Diagnosis not present

## 2021-04-05 DIAGNOSIS — I872 Venous insufficiency (chronic) (peripheral): Secondary | ICD-10-CM | POA: Diagnosis not present

## 2021-04-05 DIAGNOSIS — B351 Tinea unguium: Secondary | ICD-10-CM | POA: Diagnosis not present

## 2021-04-05 DIAGNOSIS — I879 Disorder of vein, unspecified: Secondary | ICD-10-CM | POA: Diagnosis not present

## 2021-04-09 ENCOUNTER — Other Ambulatory Visit: Payer: Self-pay | Admitting: Family Medicine

## 2021-04-10 ENCOUNTER — Other Ambulatory Visit (HOSPITAL_COMMUNITY): Payer: Self-pay

## 2021-04-10 MED ORDER — CHLORTHALIDONE 25 MG PO TABS
ORAL_TABLET | Freq: Every day | ORAL | 0 refills | Status: DC
  Filled 2021-04-10: qty 90, 90d supply, fill #0

## 2021-04-11 ENCOUNTER — Other Ambulatory Visit: Payer: Self-pay | Admitting: Family Medicine

## 2021-04-11 ENCOUNTER — Other Ambulatory Visit (HOSPITAL_COMMUNITY): Payer: Self-pay

## 2021-04-11 DIAGNOSIS — R600 Localized edema: Secondary | ICD-10-CM

## 2021-04-11 MED ORDER — FUROSEMIDE 20 MG PO TABS
ORAL_TABLET | ORAL | 0 refills | Status: DC
  Filled 2021-04-11: qty 100, 16d supply, fill #0

## 2021-04-25 ENCOUNTER — Encounter: Payer: 59 | Admitting: Neurology

## 2021-04-30 ENCOUNTER — Other Ambulatory Visit: Payer: Self-pay | Admitting: Family Medicine

## 2021-04-30 ENCOUNTER — Other Ambulatory Visit (HOSPITAL_COMMUNITY): Payer: Self-pay

## 2021-04-30 DIAGNOSIS — R600 Localized edema: Secondary | ICD-10-CM

## 2021-04-30 MED ORDER — METOPROLOL SUCCINATE ER 50 MG PO TB24
ORAL_TABLET | Freq: Two times a day (BID) | ORAL | 0 refills | Status: DC
  Filled 2021-04-30: qty 180, 90d supply, fill #0

## 2021-05-02 ENCOUNTER — Other Ambulatory Visit: Payer: Self-pay | Admitting: Family Medicine

## 2021-05-02 ENCOUNTER — Other Ambulatory Visit (HOSPITAL_COMMUNITY): Payer: Self-pay

## 2021-05-02 DIAGNOSIS — R600 Localized edema: Secondary | ICD-10-CM

## 2021-05-03 ENCOUNTER — Other Ambulatory Visit (HOSPITAL_COMMUNITY): Payer: Self-pay

## 2021-05-03 ENCOUNTER — Telehealth: Payer: Self-pay

## 2021-05-03 DIAGNOSIS — R600 Localized edema: Secondary | ICD-10-CM

## 2021-05-03 MED ORDER — FUROSEMIDE 20 MG PO TABS
ORAL_TABLET | ORAL | 0 refills | Status: DC
  Filled 2021-05-03: qty 100, 25d supply, fill #0

## 2021-05-03 NOTE — Telephone Encounter (Signed)
Patient called stating she needs Rx refill furosemide (LASIX) 20 MG tablet Pt has appt scheduled for 9/16 pt also stated she took her last pill this morning and does not have any for this evening and would like Rx filled today

## 2021-05-03 NOTE — Addendum Note (Signed)
Addended by: Agnes Lawrence on: 05/03/2021 08:39 AM   Modules accepted: Orders

## 2021-05-03 NOTE — Telephone Encounter (Signed)
Spoke with the patient and informed her a refill was sent to [facility].

## 2021-05-03 NOTE — Telephone Encounter (Signed)
PT called to advise that she is needing to have her med furosemide (LASIX) 20 MG tablet refilled asap before the pharmacy closes today. She is out of her meds and does know she has a apt tomorrow but she needs a refill asap. Please call when its been called in.

## 2021-05-03 NOTE — Telephone Encounter (Signed)
Rx done. 

## 2021-05-04 ENCOUNTER — Other Ambulatory Visit (HOSPITAL_COMMUNITY): Payer: Self-pay

## 2021-05-04 ENCOUNTER — Telehealth (INDEPENDENT_AMBULATORY_CARE_PROVIDER_SITE_OTHER): Payer: 59 | Admitting: Family Medicine

## 2021-05-04 ENCOUNTER — Encounter: Payer: Self-pay | Admitting: Family Medicine

## 2021-05-04 VITALS — BP 139/69 | HR 62 | Wt 260.0 lb

## 2021-05-04 DIAGNOSIS — M069 Rheumatoid arthritis, unspecified: Secondary | ICD-10-CM | POA: Diagnosis not present

## 2021-05-04 DIAGNOSIS — R29898 Other symptoms and signs involving the musculoskeletal system: Secondary | ICD-10-CM | POA: Diagnosis not present

## 2021-05-04 DIAGNOSIS — M810 Age-related osteoporosis without current pathological fracture: Secondary | ICD-10-CM

## 2021-05-04 DIAGNOSIS — M5417 Radiculopathy, lumbosacral region: Secondary | ICD-10-CM | POA: Diagnosis not present

## 2021-05-04 DIAGNOSIS — I7381 Erythromelalgia: Secondary | ICD-10-CM

## 2021-05-04 DIAGNOSIS — R600 Localized edema: Secondary | ICD-10-CM | POA: Diagnosis not present

## 2021-05-04 DIAGNOSIS — E785 Hyperlipidemia, unspecified: Secondary | ICD-10-CM | POA: Diagnosis not present

## 2021-05-04 DIAGNOSIS — R739 Hyperglycemia, unspecified: Secondary | ICD-10-CM

## 2021-05-04 DIAGNOSIS — E213 Hyperparathyroidism, unspecified: Secondary | ICD-10-CM

## 2021-05-04 DIAGNOSIS — E538 Deficiency of other specified B group vitamins: Secondary | ICD-10-CM

## 2021-05-04 DIAGNOSIS — I10 Essential (primary) hypertension: Secondary | ICD-10-CM | POA: Diagnosis not present

## 2021-05-04 MED ORDER — MELOXICAM 7.5 MG PO TABS
7.5000 mg | ORAL_TABLET | Freq: Every day | ORAL | 1 refills | Status: DC
  Filled 2021-05-04: qty 90, 90d supply, fill #0

## 2021-05-04 MED ORDER — OMEPRAZOLE 20 MG PO CPDR
20.0000 mg | DELAYED_RELEASE_CAPSULE | Freq: Every day | ORAL | 1 refills | Status: DC | PRN
  Filled 2021-05-04: qty 90, 90d supply, fill #0

## 2021-05-04 NOTE — Progress Notes (Signed)
Virtual Visit via Video Note  I connected with [name]  on 05/04/21 at  3:00 PM EDT by a video enabled telemedicine application and verified that I am speaking with the correct person using two identifiers.  Location patient: home Location provider: [name], [name] [address] Persons participating in the virtual visit: patient, provider  I discussed the limitations of evaluation and management by telemedicine and the availability of in person appointments. The patient expressed understanding and agreed to proceed.   [name] DOB: Dec 31, 1950 Encounter date: 05/04/2021  This is a 70 y.o. female who presents with Chief Complaint  Patient presents with   Medication Refill    History of present illness:  Could not get virtual visit to work on her phone - she had no image and could not hear me but could see me; visit transitioned to phone visit.   *did have vein procedure completed for reflux. She is still getting the blisters on the bottom of her feet. She is no longer using the ice. Feet had been so bad she saw a different podiatrist and she was started on antifungal. Was afraid of amputation. She kept following up regularly. Feet have cleared up mostly. Swelling has gotten better. Still has heaviness in legs; but wearing support stockings. Pain has improved to a degree. In past she wasn't able to wear compression stockings. Redness is better and heat is better than before. She has a shape to legs. Still difficult to tolerate walking around. Even standing to cook bothers her. Missing cooking and being out in yard. Can't drive self now. Can't stand. She has really lost her independence.   Takes naproxen about BID most days. Arthritis flares with weather. She does follow regularly with rheumatology for rheumatoid arthritis. Back hurts the worst. Last visit was in 12/2020.   Has numbness in fingers and only way to get feeling back in these  is heating rice socks and putting fingers in them. Has upcoming visit with neurology for nerve testing with arms (she had to reschedule due to leg issues) and then will follow back up with Dr. Vertell Limber (back surgeon). Takes naproxen when this is bothering her. She cannot tolerate tramadol - made her feel stoned.    Allergies  Allergen Reactions   Amlodipine Besylate Other (See Comments)    Tremors   Depakote [Divalproex Sodium] Swelling   Nortriptyline Other (See Comments)    tremors   Dilaudid [Hydromorphone Hcl]     Headache, muscle tightness   Current Meds  Medication Sig   aspirin 81 MG tablet Take 81 mg by mouth at bedtime.    chlorthalidone (HYGROTON) 25 MG tablet TAKE 1 TABLET BY MOUTH DAILY. (need appt for refills)   Cholecalciferol (VITAMIN D) 125 MCG (5000 UT) CAPS Take 5,000 Units by mouth daily.   diclofenac sodium (VOLTAREN) 1 % GEL Apply 2 g topically 4 (four) times daily as needed (pain).   Fexofenadine HCl (MUCINEX ALLERGY PO) Take by mouth daily as needed.   furosemide (LASIX) 20 MG tablet TAKE 2 TO 3 TABLETS BY MOUTH 2 TIMES DAILY AS NEEDED FOR EDEMA * NEEDS APPT   gabapentin (NEURONTIN) 100 MG capsule TAKE 3 CAPSULES IN THE MORNING AND 2 CAPSULES AT BEDTIME. OK TO TAKE EXTRA CAPSULE AT BEDTIME AS NEEDED   hydroxychloroquine (PLAQUENIL) 200 MG tablet Take 1 tablet by mouth with food or milk twice a day   hydroxypropyl methylcellulose / hypromellose (ISOPTO TEARS /  GONIOVISC) 2.5 % ophthalmic solution Place 1 drop into both eyes daily.   losartan (COZAAR) 100 MG tablet TAKE 1 TABLET BY MOUTH ONCE A DAY   metoprolol succinate (TOPROL-XL) 50 MG 24 hr tablet TAKE 1 TABLET BY MOUTH TWICE DAILY   naproxen (NAPROSYN) 500 MG tablet Take 1 tablet by mouth with food or milk every 12 hours as needed   sulfaSALAzine (AZULFIDINE) 500 MG tablet Take 2 tablets by mouth  twice a day   tiZANidine (ZANAFLEX) 2 MG tablet TAKE 1 TABLET BY MOUTH EVERY 6 TO 8 HOURS AS NEEDED FOR MUSCLE SPASMS     Review of Systems  Constitutional:  Positive for activity change (limited activity due to pain). Negative for chills, fatigue and fever.  Respiratory:  Negative for cough, chest tightness, shortness of breath and wheezing.   Cardiovascular:  Negative for chest pain, palpitations and leg swelling.  Musculoskeletal:  Positive for arthralgias, back pain and gait problem.  Skin:  Positive for color change (chronic, lower legs).  Neurological:  Positive for numbness.   Objective:  BP 139/69   Pulse 62   Wt 260 lb (117.9 kg)   BMI 41.97 kg/m   Weight: 260 lb (117.9 kg)   BP Readings from Last 3 Encounters:  05/04/21 139/69  01/10/21 127/71  07/05/20 (!) 162/89   Wt Readings from Last 3 Encounters:  05/04/21 260 lb (117.9 kg)  11/17/20 250 lb (113.4 kg)  07/05/20 257 lb (116.6 kg)    EXAM:  GENERAL: alert, oriented, appears well and in no acute distress  HEENT: atraumatic, conjunctiva clear, no obvious abnormalities on inspection of external nose and ears  NECK: normal movements of the head and neck  LUNGS: on inspection no signs of respiratory distress, breathing rate appears normal, no obvious gross SOB, gasping or wheezing  CV: no obvious cyanosis  MS: moves all visible extremities without noticeable abnormality  PSYCH/NEURO: pleasant and cooperative, no obvious depression or anxiety, speech and thought processing grossly intact   Assessment/Plan  1. Muscular deconditioning She is interested in working on strengthening to help get her back some mobility and to possibly help with her fluid mobilization/edema.  She is unable to drive herself and is very limited with walking, so needs in-home therapy. - Ambulatory referral to Home Health  2. Erythromelalgia [facility]) She is following with specialist for this.  She has had some improvement in symptoms. - Ambulatory referral to [place]  3. Primary hypertension Blood pressures been okay at home.  She is due for an  office exam.  Continue current medications.  4. Rheumatoid arthritis, involving unspecified site, unspecified whether rheumatoid factor present [facility]) She does follow regularly with rheumatology.  Is always having some pain.  She does feel like the naproxen is helpful for her for pain, but we discussed risk of GI bleed with this medication.  She is willing to try meloxicam as we discussed that this is a once daily medication.  We also decided to add omeprazole to help with stomach protection.  5. L-S radiculopathy She is following regularly with specialist.  Continues to struggle with severe daily back pain.  6. Hyperparathyroidism ([place]) Recheck blood work at next visit.  7. Hyperlipidemia, unspecified hyperlipidemia type We will need to recheck blood work at her next appointment.  Cholesterols been high and she has been hesitant to start a statin.  8. Osteoporosis, unspecified osteoporosis type, unspecified pathological fracture presence Vitamin D.  Bone density has been ordered  9. Lower extremity  edema -chronic She does have some improvement overall with swelling.  Discussed continuing to elevate, compression. - furosemide (LASIX) 20 MG tablet; Take 2-3 tablets (40-60 mg total) by mouth 2 (two) times daily as needed for edema.  Dispense: 100 tablet; Refill: 0     I discussed the assessment and treatment plan with the patient. The patient was provided an opportunity to ask questions and all were answered. The patient agreed with the plan and demonstrated an understanding of the instructions.   The patient was advised to call back or seek an in-person evaluation if the symptoms worsen or if the condition fails to improve as anticipated.  I provided 35 minutes of non-face-to-face time during this encounter.   [name], MD

## 2021-05-07 ENCOUNTER — Other Ambulatory Visit (HOSPITAL_COMMUNITY): Payer: Self-pay

## 2021-05-07 MED ORDER — FUROSEMIDE 20 MG PO TABS
40.0000 mg | ORAL_TABLET | Freq: Two times a day (BID) | ORAL | 0 refills | Status: DC | PRN
Start: 2021-05-07 — End: 2021-06-13
  Filled 2021-05-07 – 2021-05-23 (×3): qty 100, 17d supply, fill #0

## 2021-05-07 MED ORDER — LOSARTAN POTASSIUM 100 MG PO TABS
100.0000 mg | ORAL_TABLET | Freq: Every day | ORAL | 1 refills | Status: DC
  Filled 2021-05-07 – 2021-08-02 (×2): qty 90, 90d supply, fill #0
  Filled 2021-11-06: qty 90, 90d supply, fill #1

## 2021-05-07 MED ORDER — CHLORTHALIDONE 25 MG PO TABS
25.0000 mg | ORAL_TABLET | Freq: Every day | ORAL | 1 refills | Status: DC
  Filled 2021-05-07 – 2021-07-10 (×2): qty 90, 90d supply, fill #0

## 2021-05-17 DIAGNOSIS — M5136 Other intervertebral disc degeneration, lumbar region: Secondary | ICD-10-CM | POA: Diagnosis not present

## 2021-05-17 DIAGNOSIS — M2550 Pain in unspecified joint: Secondary | ICD-10-CM | POA: Diagnosis not present

## 2021-05-17 DIAGNOSIS — Z6841 Body Mass Index (BMI) 40.0 and over, adult: Secondary | ICD-10-CM | POA: Diagnosis not present

## 2021-05-17 DIAGNOSIS — I7381 Erythromelalgia: Secondary | ICD-10-CM | POA: Diagnosis not present

## 2021-05-17 DIAGNOSIS — M5441 Lumbago with sciatica, right side: Secondary | ICD-10-CM | POA: Diagnosis not present

## 2021-05-17 DIAGNOSIS — M0609 Rheumatoid arthritis without rheumatoid factor, multiple sites: Secondary | ICD-10-CM | POA: Diagnosis not present

## 2021-05-21 ENCOUNTER — Other Ambulatory Visit (HOSPITAL_COMMUNITY): Payer: Self-pay

## 2021-05-21 ENCOUNTER — Ambulatory Visit: Payer: 59 | Admitting: Neurology

## 2021-05-22 ENCOUNTER — Other Ambulatory Visit (HOSPITAL_COMMUNITY): Payer: Self-pay

## 2021-05-23 ENCOUNTER — Other Ambulatory Visit (HOSPITAL_COMMUNITY): Payer: Self-pay

## 2021-05-29 ENCOUNTER — Other Ambulatory Visit (INDEPENDENT_AMBULATORY_CARE_PROVIDER_SITE_OTHER): Payer: 59

## 2021-05-29 ENCOUNTER — Other Ambulatory Visit: Payer: Self-pay

## 2021-05-29 DIAGNOSIS — E213 Hyperparathyroidism, unspecified: Secondary | ICD-10-CM | POA: Diagnosis not present

## 2021-05-29 DIAGNOSIS — R739 Hyperglycemia, unspecified: Secondary | ICD-10-CM

## 2021-05-29 DIAGNOSIS — E538 Deficiency of other specified B group vitamins: Secondary | ICD-10-CM | POA: Diagnosis not present

## 2021-05-29 DIAGNOSIS — E785 Hyperlipidemia, unspecified: Secondary | ICD-10-CM | POA: Diagnosis not present

## 2021-05-29 DIAGNOSIS — I10 Essential (primary) hypertension: Secondary | ICD-10-CM | POA: Diagnosis not present

## 2021-05-29 DIAGNOSIS — M810 Age-related osteoporosis without current pathological fracture: Secondary | ICD-10-CM

## 2021-05-29 LAB — COMPREHENSIVE METABOLIC PANEL
ALT: 16 U/L (ref 0–35)
AST: 18 U/L (ref 0–37)
Albumin: 4.2 g/dL (ref 3.5–5.2)
Alkaline Phosphatase: 68 U/L (ref 39–117)
BUN: 22 mg/dL (ref 6–23)
CO2: 33 mEq/L — ABNORMAL HIGH (ref 19–32)
Calcium: 9.4 mg/dL (ref 8.4–10.5)
Chloride: 94 mEq/L — ABNORMAL LOW (ref 96–112)
Creatinine, Ser: 1.26 mg/dL — ABNORMAL HIGH (ref 0.40–1.20)
GFR: 42.68 mL/min — ABNORMAL LOW (ref >60.00)
Glucose, Bld: 113 mg/dL — ABNORMAL HIGH (ref 70–99)
Potassium: 2.9 mEq/L — ABNORMAL LOW (ref 3.5–5.1)
Sodium: 137 mEq/L (ref 135–145)
Total Bilirubin: 0.4 mg/dL (ref 0.2–1.2)
Total Protein: 7.4 g/dL (ref 6.0–8.3)

## 2021-05-29 LAB — CBC WITH DIFFERENTIAL/PLATELET
Basophils Absolute: 0 10*3/uL (ref 0.0–0.1)
Basophils Relative: 0.8 % (ref 0.0–3.0)
Eosinophils Absolute: 0.1 10*3/uL (ref 0.0–0.7)
Eosinophils Relative: 1 % (ref 0.0–5.0)
HCT: 39.7 % (ref 36.0–46.0)
Hemoglobin: 13.7 g/dL (ref 12.0–15.0)
Lymphocytes Relative: 24 % (ref 12.0–46.0)
Lymphs Abs: 1.4 10*3/uL (ref 0.7–4.0)
MCHC: 34.4 g/dL (ref 30.0–36.0)
MCV: 84.3 fl (ref 78.0–100.0)
Monocytes Absolute: 0.5 10*3/uL (ref 0.1–1.0)
Monocytes Relative: 8.7 % (ref 3.0–12.0)
Neutro Abs: 3.7 10*3/uL (ref 1.4–7.7)
Neutrophils Relative %: 65.5 % (ref 43.0–77.0)
Platelets: 265 10*3/uL (ref 150.0–400.0)
RBC: 4.71 Mil/uL (ref 3.87–5.11)
RDW: 13.6 % (ref 11.5–15.5)
WBC: 5.7 10*3/uL (ref 4.0–10.5)

## 2021-05-29 LAB — LIPID PANEL
Cholesterol: 227 mg/dL — ABNORMAL HIGH (ref 0–200)
HDL: 39.6 mg/dL (ref >39.00)
NonHDL: 187
Total CHOL/HDL Ratio: 6
Triglycerides: 290 mg/dL — ABNORMAL HIGH (ref 0.0–149.0)
VLDL: 58 mg/dL — ABNORMAL HIGH (ref 0.0–40.0)

## 2021-05-29 LAB — VITAMIN D 25 HYDROXY (VIT D DEFICIENCY, FRACTURES): VITD: 40.47 ng/mL (ref 30.00–100.00)

## 2021-05-29 LAB — HEMOGLOBIN A1C: Hgb A1c MFr Bld: 5.6 % (ref 4.6–6.5)

## 2021-05-29 LAB — VITAMIN B12: Vitamin B-12: 167 pg/mL — ABNORMAL LOW (ref 211–911)

## 2021-05-29 LAB — LDL CHOLESTEROL, DIRECT: Direct LDL: 153 mg/dL

## 2021-05-29 LAB — FOLATE: Folate: 7.6 ng/mL (ref >5.9)

## 2021-06-03 LAB — PTH, INTACT AND CALCIUM
Calcium: 9.2 mg/dL (ref 8.6–10.4)
PTH: 122 pg/mL — ABNORMAL HIGH (ref 16–77)

## 2021-06-03 LAB — HOMOCYSTEINE: Homocysteine: 26.1 umol/L — ABNORMAL HIGH (ref <10.4)

## 2021-06-03 LAB — METHYLMALONIC ACID, SERUM: Methylmalonic Acid, Quant: 459 nmol/L — ABNORMAL HIGH (ref 87–318)

## 2021-06-07 ENCOUNTER — Other Ambulatory Visit: Payer: Self-pay | Admitting: Family Medicine

## 2021-06-07 DIAGNOSIS — E538 Deficiency of other specified B group vitamins: Secondary | ICD-10-CM

## 2021-06-07 DIAGNOSIS — E876 Hypokalemia: Secondary | ICD-10-CM

## 2021-06-07 DIAGNOSIS — R7989 Other specified abnormal findings of blood chemistry: Secondary | ICD-10-CM

## 2021-06-08 ENCOUNTER — Other Ambulatory Visit (HOSPITAL_COMMUNITY): Payer: Self-pay

## 2021-06-08 MED ORDER — POTASSIUM CHLORIDE CRYS ER 20 MEQ PO TBCR
EXTENDED_RELEASE_TABLET | ORAL | 1 refills | Status: DC
  Filled 2021-06-08: qty 60, 58d supply, fill #0

## 2021-06-08 MED ORDER — ROSUVASTATIN CALCIUM 5 MG PO TABS
5.0000 mg | ORAL_TABLET | Freq: Every day | ORAL | 1 refills | Status: DC
  Filled 2021-06-08: qty 90, 90d supply, fill #0

## 2021-06-13 ENCOUNTER — Other Ambulatory Visit (HOSPITAL_COMMUNITY): Payer: Self-pay

## 2021-06-13 ENCOUNTER — Other Ambulatory Visit: Payer: Self-pay | Admitting: Family Medicine

## 2021-06-13 DIAGNOSIS — R600 Localized edema: Secondary | ICD-10-CM

## 2021-06-13 MED ORDER — FUROSEMIDE 20 MG PO TABS
40.0000 mg | ORAL_TABLET | Freq: Two times a day (BID) | ORAL | 2 refills | Status: DC | PRN
  Filled 2021-06-13: qty 100, 17d supply, fill #0
  Filled 2021-07-05: qty 100, 17d supply, fill #1
  Filled 2021-07-24: qty 100, 17d supply, fill #2

## 2021-06-20 ENCOUNTER — Other Ambulatory Visit (HOSPITAL_COMMUNITY): Payer: Self-pay

## 2021-06-26 ENCOUNTER — Encounter: Payer: 59 | Admitting: Neurology

## 2021-07-02 DIAGNOSIS — I872 Venous insufficiency (chronic) (peripheral): Secondary | ICD-10-CM | POA: Diagnosis not present

## 2021-07-05 ENCOUNTER — Other Ambulatory Visit (HOSPITAL_COMMUNITY): Payer: Self-pay

## 2021-07-10 ENCOUNTER — Other Ambulatory Visit (HOSPITAL_COMMUNITY): Payer: Self-pay

## 2021-07-10 MED ORDER — SULFASALAZINE 500 MG PO TABS
1000.0000 mg | ORAL_TABLET | Freq: Two times a day (BID) | ORAL | 1 refills | Status: DC
  Filled 2021-07-10: qty 360, 90d supply, fill #0
  Filled 2021-10-17: qty 360, 90d supply, fill #1

## 2021-07-11 ENCOUNTER — Other Ambulatory Visit (HOSPITAL_COMMUNITY): Payer: Self-pay

## 2021-07-17 ENCOUNTER — Other Ambulatory Visit (HOSPITAL_COMMUNITY): Payer: Self-pay

## 2021-07-17 MED FILL — Gabapentin Cap 100 MG: ORAL | 90 days supply | Qty: 540 | Fill #2 | Status: AC

## 2021-07-19 ENCOUNTER — Encounter: Payer: Self-pay | Admitting: Neurology

## 2021-07-24 ENCOUNTER — Other Ambulatory Visit (HOSPITAL_COMMUNITY): Payer: Self-pay

## 2021-07-30 ENCOUNTER — Other Ambulatory Visit: Payer: Self-pay

## 2021-07-30 ENCOUNTER — Encounter: Payer: Self-pay | Admitting: Internal Medicine

## 2021-07-30 ENCOUNTER — Ambulatory Visit (INDEPENDENT_AMBULATORY_CARE_PROVIDER_SITE_OTHER): Payer: Medicare Other | Admitting: Internal Medicine

## 2021-07-30 VITALS — BP 128/82 | HR 86 | Ht 66.0 in | Wt 260.6 lb

## 2021-07-30 DIAGNOSIS — M810 Age-related osteoporosis without current pathological fracture: Secondary | ICD-10-CM

## 2021-07-30 DIAGNOSIS — E213 Hyperparathyroidism, unspecified: Secondary | ICD-10-CM | POA: Diagnosis not present

## 2021-07-30 DIAGNOSIS — E559 Vitamin D deficiency, unspecified: Secondary | ICD-10-CM | POA: Diagnosis not present

## 2021-07-30 DIAGNOSIS — E042 Nontoxic multinodular goiter: Secondary | ICD-10-CM | POA: Diagnosis not present

## 2021-07-30 LAB — TSH: TSH: 2.14 u[IU]/mL (ref 0.35–5.50)

## 2021-07-30 NOTE — Progress Notes (Signed)
Patient ID: [name], female   DOB: 1951-05-21, 70 y.o.   MRN: [id number]   This visit occurred during the SARS-CoV-2 public health emergency.  Safety protocols were in place, including screening questions prior to the visit, additional usage of staff PPE, and extensive cleaning of exam room while observing appropriate contact time as indicated for disinfecting solutions.   HPI  [name] is a 70 y.o.-year-old female presenting for follow-up for primary hyperparathyroidism, osteoporosis, vitamin D deficiency and now also thyroid nodules.  Last visit 2 years and 6 months ago. She is the wife of [name].  He accompanies her to the appointment today and offers part of the history especially regarding patient's past medical history and activity.  Interim history: She does not feel different after her parathyroid surgery, which she had after our last visit.  She continues to have hair loss. She has low back pain in last 6 months - s/p back surgery 3 years ago. She had venous surgery on legs since last OV - pain in feet improved. No falls or fractures since last visit. She is off and on a B complex.  Pt has had hypercalcemia at least since 2014.  She was then diagnosed with primary hyperparathyroidism.  Reviewed pertinent labs: Lab Results  Component Value Date   PTH 122 (H) 05/29/2021   PTH 69 (H) 07/30/2018   PTH 66 (H) 04/03/2017   CALCIUM 9.2 05/29/2021   CALCIUM 9.4 05/29/2021   CALCIUM 9.3 03/31/2020   CALCIUM 9.6 07/30/2019   CALCIUM 10.6 (H) 04/07/2019   CALCIUM 10.4 02/10/2019   CALCIUM 10.9 (H) 07/30/2018   CALCIUM 10.0 06/28/2018   CALCIUM 10.7 (H) 06/04/2018   CALCIUM 10.5 01/13/2018  She takes biotin as a part of the B complex for peripheral neuropathy and erythromelalgia.  Calcitriol, magnesium, phosphorus were normal: Component     Latest Ref Rng & Units 07/30/2018  Vitamin D 1, 25 (OH) Total     18 - 72 pg/mL 69  Vitamin D3 1, 25 (OH)     pg/mL 69   Vitamin D2 1, 25 (OH)     pg/mL <8  Magnesium     1.5 - 2.5 mg/dL 1.9  Phosphorus     2.3 - 4.6 mg/dL 2.3   At last visit I referred her to see Dr. 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 Findings suggestive of multinodular goiter. 2. Nodule #1 meets imaging criteria to recommend percutaneous sampling as clinically indicated. 3. Nodule #5 meets imaging criteria to recommend a 1 year follow-up. 4. Punctate nodules adjacent to the inferior aspect of the left lobe of the thyroid (labeled 7 and 8), both appear external to the thyroid gland and thus may represent non pathologically enlarged cervical lymph nodes versus parathyroid adenomas. Further evaluation with contrast-enhanced neck CT and/or nuclear medicine parathyroid scintigraphy could be performed as indicated. FNA (02/04/2019): Atypia of unknown significance (Bethesda category 3.  Afirma: Benign. 4D CT (02/04/2019): 1. Best imaging candidate for hyperparathyroidism is a 7 mm nodule behind the interpolar left thyroid. 2. Enlarged and multinodular right thyroid lobe which may account for the scintigraphic appearance. Left superior parathyroidectomy (Dr. 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 - Right tibial plateau fracture 06/2017 (fell down the stairs) - Right forearm 06/2017 (fell down the stairs) - Left maxillary fracture 06/2017 (fell down the stairs) - Right radial fracture 06/2017 (fell down the stairs)  No history of kidney stones.  No CKD. Last BUN/Cr: Lab Results  Component Value Date   BUN 22 05/29/2021   BUN 11 03/31/2020   CREATININE 1.26 (H) 05/29/2021   CREATININE 1.04 (H) 03/31/2020   She was on HCTZ in the past, but not recently.  Currently on Lasix.  Vitamin D deficiency:  Patient had a very low vitamin D level in 04/2017, but this normalized on 5000 units vitamin D daily : Lab Results  Component Value Date   VD25OH 40.47 05/29/2021   VD25OH 39 03/31/2020   VD25OH 36.01 02/10/2019   VD25OH 43.86 07/30/2018   VD25OH 42.24 03/04/2018   VD25OH 14.17 (L) 05/05/2017   VD25OH 51 05/14/2013   No family history of hypercalcemia, pituitary tumors, medullary thyroid cancer or osteoporosis. She has FH of thyroid ds in 2 sisters and 1 brother.  Her older son and younger sister had kidney stones.  Pt. also has a history of RA- on Plaquenil.  Previously on prednisone taper. She also has erythromelalgia, B12 deficiency - on B12 supplement, HL.  ROS: Skin: no rashes, + hair loss  I reviewed pt's medications, allergies, PMH, social hx, family hx, and changes were documented in the history of present illness. Otherwise, unchanged from my initial visit note.  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   Osteopenia 10/17/2011   DEXA 09/2007: -1.4 L fem; 10/2011: -1.2 L fem    Osteoporosis    PONV (postoperative nausea and vomiting)    Pseudogout of foot    Rheumatoid arthritis(714.0) dx 2010   Shingles    hx of    Tibial plateau fracture, right, closed, initial encounter 07/16/2017   Past Surgical History:  Procedure Laterality Date   BREAST BIOPSY  1972   Broken wrist  2010   CATARACT EXTRACTION  03/2012   left   COLONOSCOPY     COSMETIC SURGERY     FRACTURE SURGERY     INNER EAR SURGERY     busted ear drum   MAXIMUM ACCESS (MAS)POSTERIOR LUMBAR INTERBODY FUSION (PLIF) 1 LEVEL N/A 10/11/2016   Procedure: Lumbar one-Sacral one Maximum access posterior lumbar interbody fusion;  Surgeon: Erline Levine, MD;  Location:  [facility];  Service: Neurosurgery;  Laterality: N/A;   ORIF WRIST FRACTURE Right 07/18/2017   Procedure: OPEN REDUCTION INTERNAL FIXATION (ORIF) WRIST FRACTURE;  Surgeon: Renette Butters, MD;  Location: [facility];  Service: Orthopedics;  Laterality: Right;   PARATHYROIDECTOMY Left 04/12/2019   Procedure: LEFT SUPERIOR PARATHYROIDECTOMY;  Surgeon: [name], MD;  Location: [facility];  Service: General;  Laterality: Left;   pneumonia  2007   Social History   Social History   Marital status: Married    Spouse name: N/A   Number of children: 3   Years of education: N/A   Occupational History   Retired    Social History Main Topics   Smoking status: Former Smoker    Types: Cigarettes   Smokeless tobacco: Former Systems developer    Quit date: 08/19/1981   Alcohol use No   Drug use: No   Social History Network engineer -retired Building control surveyor   Married, lives with spouse, [name], he is IT support for [employer]   3 sons   2 caffeinated beverages a day   No regular exercise, diet is ok   Current Outpatient Medications on File Prior to Visit  Medication Sig  Dispense Refill   aspirin 81 MG tablet Take 81 mg by mouth at bedtime.      chlorthalidone (HYGROTON) 25 MG tablet Take 1 tablet (25 mg total) by mouth daily. 90 tablet 1   Cholecalciferol (VITAMIN D) 125 MCG (5000 UT) CAPS Take 5,000 Units by mouth daily.     diclofenac sodium (VOLTAREN) 1 % GEL Apply 2 g topically 4 (four) times daily as needed (pain).     Fexofenadine HCl (MUCINEX ALLERGY PO) Take by mouth daily as needed.     furosemide (LASIX) 20 MG tablet Take 2 - 3 tablets by mouth 2 times daily as needed for edema. 100 tablet 2   gabapentin (NEURONTIN) 100 MG capsule TAKE 3 CAPSULES IN THE MORNING AND 2 CAPSULES AT BEDTIME. OK TO TAKE EXTRA CAPSULE AT BEDTIME AS NEEDED 540 capsule 3   hydroxychloroquine (PLAQUENIL) 200 MG tablet Take 1 tablet by mouth with food or milk twice a day 180 tablet 1   hydroxypropyl methylcellulose / hypromellose (ISOPTO TEARS / GONIOVISC) 2.5 % ophthalmic solution Place 1 drop into both eyes daily.     losartan (COZAAR) 100 MG tablet TAKE 1 TABLET BY MOUTH ONCE A DAY 90 tablet 1   meloxicam (MOBIC) 7.5 MG tablet Take 1 tablet (7.5 mg total) by mouth daily. 90 tablet 1   metoprolol succinate (TOPROL-XL) 50 MG 24 hr tablet TAKE 1 TABLET BY MOUTH TWICE DAILY 180 tablet 0   omeprazole (PRILOSEC) 20 MG capsule Take 1 capsule (20 mg total) by mouth daily as needed. Take on days she takes NSAID 90 capsule 1   potassium chloride SA (KLOR-CON) 20 MEQ tablet Take 1 tablet by mouth twice a day for 2 days, then take 1 tablet daily thereafter. 60 tablet 1   rosuvastatin (CRESTOR) 5 MG tablet Take 1 tablet (5 mg total) by mouth daily. 90 tablet 1   sulfaSALAzine (AZULFIDINE) 500 MG tablet Take 2 tablets by mouth twice a day 360 tablet 1   No current facility-administered medications on file prior to visit.   Allergies  Allergen Reactions   Amlodipine Besylate Other (See Comments)    Tremors   Depakote [Divalproex Sodium] Swelling   Nortriptyline Other (See Comments)     tremors   Dilaudid [Hydromorphone Hcl]  Headache, muscle tightness   Tramadol     Felt "stoned"   Family History  Problem Relation Age of Onset   Stroke Mother    Hypertension Father    Heart attack Father    Hypertension Sister        x 3   Hypertension Brother    Hyperthyroidism Sister        x2, s/p RAI ablation   Hypothyroidism Brother    Colon cancer Neg Hx    Esophageal cancer Neg Hx    Rectal cancer Neg Hx    Stomach cancer Neg Hx    PE: BP 128/82 (BP Location: Right Arm, Patient Position: Sitting, Cuff Size: Normal)   Pulse 86   Ht 5\' 6"  (1.676 m)   Wt 260 lb 9.6 oz (118.2 kg)   SpO2 97%   BMI 42.06 kg/m  Wt Readings from Last 3 Encounters:  07/30/21 260 lb 9.6 oz (118.2 kg)  05/04/21 260 lb (117.9 kg)  11/17/20 250 lb (113.4 kg)   Constitutional: overweight, in NAD Eyes: PERRLA, EOMI, no exophthalmos ENT: moist mucous membranes, no thyromegaly, no cervical lymphadenopathy Cardiovascular: RRR, No MRG Respiratory: CTA B Musculoskeletal: no deformities, strength intact in all 4 Skin: moist, warm, no rashes Neurological: no tremor with outstretched hands, DTR normal in all 4  Assessment: 1. Hypercalcemia/hyperparathyroidism  2.  Osteopenia + fragility fractures = osteoporosis  3.  Vitamin D deficiency  4.  Multinodular goiter  Plan: Patient with a history of elevated calcium, and the highest calcium level being 10.9.  A parathyroid hormone was also elevated, at 69.  She also had a history of low vitamin D which normalized in 2019, after which she proceeded with parathyroid investigation.  Calcitriol, magnesium, phosphorus, and 24-hour urine calcium are all normal.  She did not have a history of nephrolithiasis but did have osteoporosis (osteopenia on bone density scans and history of fragility fractures).  I referred her to surgery at that time.  A parathyroid scan showed a right superior parathyroid adenoma but this was not seen on subsequent ultrasound.   The ultrasound showed an inferior left possible parathyroid mass.  It also showed a right thyroid nodules and it was possible that this could have confounded the results of her parathyroid scan.  One of the thyroid nodules, was large, 3.7 cm (please see below).  She had a 4D CT afterwards, which showed a possible left interpolar 7 mm nodule consistent with a parathyroid adenoma.  She had biopsy.  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  Subsequent calcium levels were normal. -However, recent PTH was elevated, at 122 on 05/29/2021 ([facility]), at which time, calcium was 9.2, normal.  GFR was slightly low, at 42.68.  Of note, she is taking biotin off-and-on - this can falsely elevate the PTH level by interference with the assay.  She is not sure whether she took biotin at the time of the last PTH test. -At today's visit, we will check calcium, intact PTH with a [facility] assay, but I will not repeat the vitamin D since her recent level was normal and she did not change her supplement dose. -If calcium remains normal, though, I would suggest to just follow this for now especially since we had recent investigation of her neck that did not show an additional parathyroid adenoma.  She and her husband agree with this plan. -I will see the patient back in 6  months  2.  Osteoporosis -Patient has a history of osteopenia on bone density scan, however, due to many fragility fractures, she has a clinical diagnosis of osteoporosis.  Most recent fracture was a right tibial one in 06/2017 -Her latest bone density scan was from early 2019: Worse, but scores were still in the osteopenic range -At last visit we discussed about treating her hyperparathyroidism first and then repeating a bone density scan to see if it improved -As of now, we discussed about repeating a bone density scan -this was already ordered by PCP -At this visit I gave her the  telephone number to schedule this.  3.  Vitamin D deficiency -She has a history of vitamin D deficiency with a very low vitamin D level in 04/2018 after which restarted 5000 units vitamin D daily -Repeat vitamin D level was normal in 02/2018 on the above dose -However, before last visit, she was off her vitamin D supplement for 2 months.  I did advise her to restart right away. She is on this now. -Latest vitamin D level was reviewed and it was recently normal: Lab Results  Component Value Date   VD25OH 40.47 05/29/2021   4.  Multinodular goiter -Initially seen on ultrasound from 09/2018: Largest nodule: 3.7 cm -When I last saw her she had a biopsy pending after the visit.  She had this on 02/04/2019.  The biopsy result was inconclusive (atypia of unknown significance-Bethesda category 3) while the Afirma molecular marker was benign. -We discussed with patient and her husband  that this is usually a benign result, but we still need to follow these nodules. -She has no neck compression symptoms -We will recheck a thyroid ultrasound now -Reviewed her TFTs -TSH was normal: Lab Results  Component Value Date   TSH 1.34 03/31/2020   - Total time spent for the visit: 40 minutes, in obtaining medical information from the chart,, patient, and her husband reviewing her  previous labs, imaging evaluations, surgical reports, and treatments, reviewing her symptoms, counseling her about her conditions (please see the discussed topics above), and developing a plan to further investigate and treat them.  Component     Latest Ref Rng & Units 07/30/2021          Calcium     8.7 - 10.3 mg/dL 9.5  PTH, Intact     15 - 65 pg/mL 43  PTH Interp      Comment  TSH     0.35 - 5.50 uIU/mL 2.14  All labs normal today, including the iPTH checked with the [facility] assay.   Philemon Kingdom, MD PhD [facility]

## 2021-07-30 NOTE — Patient Instructions (Addendum)
Please stop at the lab.  We will order a new thyroid ultrasound.  Please call and schedule bone density scan at the [facility]: [phone].   Please come back for a follow-up appointment in 6 months.

## 2021-07-31 LAB — PTH, INTACT AND CALCIUM
Calcium: 9.5 mg/dL (ref 8.7–10.3)
PTH: 43 pg/mL (ref 15–65)

## 2021-08-01 DIAGNOSIS — C4401 Basal cell carcinoma of skin of lip: Secondary | ICD-10-CM | POA: Diagnosis not present

## 2021-08-01 DIAGNOSIS — D485 Neoplasm of uncertain behavior of skin: Secondary | ICD-10-CM | POA: Diagnosis not present

## 2021-08-02 ENCOUNTER — Other Ambulatory Visit (HOSPITAL_COMMUNITY): Payer: Self-pay

## 2021-08-02 ENCOUNTER — Other Ambulatory Visit: Payer: Self-pay | Admitting: Family Medicine

## 2021-08-02 MED ORDER — METOPROLOL SUCCINATE ER 50 MG PO TB24
ORAL_TABLET | Freq: Two times a day (BID) | ORAL | 0 refills | Status: DC
  Filled 2021-08-02: qty 180, 90d supply, fill #0

## 2021-08-06 ENCOUNTER — Ambulatory Visit: Payer: 59 | Admitting: Neurology

## 2021-08-07 ENCOUNTER — Other Ambulatory Visit (HOSPITAL_COMMUNITY): Payer: Self-pay

## 2021-08-07 ENCOUNTER — Other Ambulatory Visit: Payer: Self-pay

## 2021-08-07 ENCOUNTER — Encounter: Payer: Self-pay | Admitting: Family Medicine

## 2021-08-07 ENCOUNTER — Telehealth (INDEPENDENT_AMBULATORY_CARE_PROVIDER_SITE_OTHER): Payer: Medicare Other | Admitting: Family Medicine

## 2021-08-07 DIAGNOSIS — R300 Dysuria: Secondary | ICD-10-CM | POA: Diagnosis not present

## 2021-08-07 MED ORDER — NITROFURANTOIN MONOHYD MACRO 100 MG PO CAPS
100.0000 mg | ORAL_CAPSULE | Freq: Two times a day (BID) | ORAL | 0 refills | Status: DC
  Filled 2021-08-07: qty 14, 7d supply, fill #0

## 2021-08-07 NOTE — Patient Instructions (Signed)
-  schedule a 2 week follow up inperson visit  -I sent the medication(s) we discussed to your pharmacy: Meds ordered this encounter  Medications   nitrofurantoin, macrocrystal-monohydrate, (MACROBID) 100 MG capsule    Sig: Take 1 capsule (100 mg total) by mouth 2 (two) times daily.    Dispense:  14 capsule    Refill:  0     I hope you are feeling better soon!  Seek in person care promptly in the interim if your symptoms worsen, new concerns arise or you are not improving with treatment.  It was nice to meet you today. I help [facility] out with telemedicine visits on Tuesdays and Thursdays and am happy to help if you need a virtual follow up visit on those days. Otherwise, if you have any concerns or questions following this visit please schedule a follow up visit with your Primary Care office or seek care at a local urgent care clinic to avoid delays in care

## 2021-08-07 NOTE — Progress Notes (Signed)
Virtual Visit via Telephone Note  I connected with [name] on 08/07/21 at  3:00 PM EST by telephone and verified that I am speaking with the correct person using two identifiers.   I discussed the limitations, risks, security and privacy concerns of performing an evaluation and management service by telephone and the availability of in person appointments. I also discussed with the patient that there may be a patient responsible charge related to this service. The patient expressed understanding and agreed to proceed.  Location patient: home, [place] Location provider: work or home office Participants present for the call: patient, provider Patient did not have a visit with me in the prior 7 days to address this/these issue(s).   History of Present Illness:  Acute telemedicine visit for dysuria: -Onset: the last few days or a week or two -Symptoms include: urinary frequency, some burning when urinates at times, low back pain, also noticed there might been a little pink when wiped when she urinated twice in this time period -Denies:pelvic pain, flank pain, fever, NVD, vaginal symptoms -Pertinent past medical history: hx benign renal cyst -Pertinent medication allergies: Allergies  Allergen Reactions   Amlodipine Besylate Other (See Comments)    Tremors   Depakote [Divalproex Sodium] Swelling   Nortriptyline Other (See Comments)    tremors   Dilaudid [Hydromorphone Hcl]     Headache, muscle tightness   Tramadol     Felt "stoned"     Observations/Objective: Patient sounds cheerful and well on the phone. I do not appreciate any SOB. Speech and thought processing are grossly intact. Patient reported vitals:  Assessment and Plan:  Dysuria  -we discussed possible serious and likely etiologies, options for evaluation and workup, limitations of telemedicine visit vs in person visit, treatment, treatment risks and precautions. Pt prefers to treat via telemedicine empirically  rather than in person at this moment. Going into the holiday. She wants to try an antibiotic empirically for a possible UTI. She agrees to schedule a 2 week follow up for an inperson evaluation and urine check.  Advised to seek prompt in person care in the interim if worsening, new symptoms arise, or if is not improving with treatment. Advised of options for inperson care in case PCP office not available.  Follow Up Instructions:  I did not refer this patient for an OV with me in the next 24 hours for this/these issue(s).  I discussed the assessment and treatment plan with the patient. The patient was provided an opportunity to ask questions and all were answered. The patient agreed with the plan and demonstrated an understanding of the instructions.   I spent 18 minutes on the date of this visit in the care of this patient. See summary of tasks completed to properly care for this patient in the detailed notes above which also included counseling of above, review of PMH, medications, allergies, evaluation of the patient and ordering and/or  instructing patient on testing and care options.     Lucretia Kern, DO

## 2021-08-09 ENCOUNTER — Other Ambulatory Visit (INDEPENDENT_AMBULATORY_CARE_PROVIDER_SITE_OTHER): Payer: Medicare Other

## 2021-08-09 ENCOUNTER — Ambulatory Visit: Payer: Medicare Other | Admitting: Neurology

## 2021-08-09 ENCOUNTER — Telehealth: Payer: Self-pay | Admitting: Family Medicine

## 2021-08-09 ENCOUNTER — Other Ambulatory Visit: Payer: Self-pay

## 2021-08-09 ENCOUNTER — Telehealth: Payer: Self-pay | Admitting: *Deleted

## 2021-08-09 DIAGNOSIS — E538 Deficiency of other specified B group vitamins: Secondary | ICD-10-CM | POA: Diagnosis not present

## 2021-08-09 DIAGNOSIS — R202 Paresthesia of skin: Secondary | ICD-10-CM

## 2021-08-09 DIAGNOSIS — N289 Disorder of kidney and ureter, unspecified: Secondary | ICD-10-CM

## 2021-08-09 DIAGNOSIS — E876 Hypokalemia: Secondary | ICD-10-CM | POA: Diagnosis not present

## 2021-08-09 LAB — COMPREHENSIVE METABOLIC PANEL
ALT: 19 U/L (ref 0–35)
AST: 19 U/L (ref 0–37)
Albumin: 4.2 g/dL (ref 3.5–5.2)
Alkaline Phosphatase: 58 U/L (ref 39–117)
BUN: 27 mg/dL — ABNORMAL HIGH (ref 6–23)
CO2: 35 mEq/L — ABNORMAL HIGH (ref 19–32)
Calcium: 9.8 mg/dL (ref 8.4–10.5)
Chloride: 91 mEq/L — ABNORMAL LOW (ref 96–112)
Creatinine, Ser: 1.4 mg/dL — ABNORMAL HIGH (ref 0.40–1.20)
GFR: 37.56 mL/min — ABNORMAL LOW (ref >60.00)
Glucose, Bld: 145 mg/dL — ABNORMAL HIGH (ref 70–99)
Potassium: 2.7 mEq/L — CL (ref 3.5–5.1)
Sodium: 137 mEq/L (ref 135–145)
Total Bilirubin: 0.5 mg/dL (ref 0.2–1.2)
Total Protein: 7.4 g/dL (ref 6.0–8.3)

## 2021-08-09 LAB — FOLATE: Folate: 11.2 ng/mL (ref >5.9)

## 2021-08-09 LAB — VITAMIN B12: Vitamin B-12: 317 pg/mL (ref 211–911)

## 2021-08-09 NOTE — Progress Notes (Signed)
Patient arrived for EMG, however, tested was terminated due to pain.   [name] K. Posey Pronto, DO

## 2021-08-09 NOTE — Telephone Encounter (Signed)
CRITICAL VALUE STICKER  CRITICAL VALUE:  Potassium 2.7  RECEIVER (on-site recipient of call): Per [name] in the lab [facility] from the [facility] lab called)  DATE & TIME NOTIFIED: 08/09/2021 at 4:02pm  MESSENGER (representative from lab):Christine [place]  MD NOTIFIED: [name] as PCP is out of the office  TIME OF NOTIFICATION: 4:02pm  RESPONSE:  message sent via EPIC

## 2021-08-09 NOTE — Addendum Note (Signed)
Addended by: Alda Berthold on: 08/09/2021 11:52 AM   Modules accepted: Level of Service

## 2021-08-09 NOTE — Telephone Encounter (Signed)
Spoke with the patient and informed her of the results and instructions as below.  Message sent to PCP.

## 2021-08-09 NOTE — Telephone Encounter (Signed)
Pt call and need a refill on herpotassium chloride SA (KLOR-CON) 20 MEQ tablet sent to  [id number] - [place], [place] - [address] Phone:  [phone]  Fax:  [phone]

## 2021-08-10 ENCOUNTER — Other Ambulatory Visit: Payer: Self-pay | Admitting: Family Medicine

## 2021-08-10 ENCOUNTER — Other Ambulatory Visit (HOSPITAL_COMMUNITY): Payer: Self-pay

## 2021-08-10 DIAGNOSIS — E876 Hypokalemia: Secondary | ICD-10-CM

## 2021-08-10 MED ORDER — POTASSIUM CHLORIDE CRYS ER 20 MEQ PO TBCR
20.0000 meq | EXTENDED_RELEASE_TABLET | Freq: Two times a day (BID) | ORAL | 1 refills | Status: DC
  Filled 2021-08-10: qty 180, 90d supply, fill #0
  Filled 2021-11-21: qty 180, 90d supply, fill #1

## 2021-08-10 NOTE — Telephone Encounter (Signed)
Noted. Will refill potassium.

## 2021-08-12 LAB — METHYLMALONIC ACID, SERUM: Methylmalonic Acid, Quant: 387 nmol/L — ABNORMAL HIGH (ref 87–318)

## 2021-08-12 LAB — HOMOCYSTEINE: Homocysteine: 29 umol/L — ABNORMAL HIGH (ref <10.4)

## 2021-08-15 ENCOUNTER — Other Ambulatory Visit: Payer: Medicare Other

## 2021-08-15 ENCOUNTER — Other Ambulatory Visit: Payer: Self-pay | Admitting: Family Medicine

## 2021-08-15 ENCOUNTER — Other Ambulatory Visit (HOSPITAL_COMMUNITY): Payer: Self-pay

## 2021-08-15 DIAGNOSIS — R600 Localized edema: Secondary | ICD-10-CM

## 2021-08-15 MED ORDER — FUROSEMIDE 20 MG PO TABS
40.0000 mg | ORAL_TABLET | Freq: Two times a day (BID) | ORAL | 0 refills | Status: DC | PRN
  Filled 2021-08-15: qty 100, 16d supply, fill #0

## 2021-08-17 ENCOUNTER — Other Ambulatory Visit (INDEPENDENT_AMBULATORY_CARE_PROVIDER_SITE_OTHER): Payer: Medicare Other

## 2021-08-17 DIAGNOSIS — E876 Hypokalemia: Secondary | ICD-10-CM | POA: Diagnosis not present

## 2021-08-18 LAB — BASIC METABOLIC PANEL
BUN/Creatinine Ratio: 19 (calc) (ref 6–22)
BUN: 30 mg/dL — ABNORMAL HIGH (ref 7–25)
CO2: 35 mmol/L — ABNORMAL HIGH (ref 20–32)
Calcium: 9.3 mg/dL (ref 8.6–10.4)
Chloride: 93 mmol/L — ABNORMAL LOW (ref 98–110)
Creat: 1.59 mg/dL — ABNORMAL HIGH (ref 0.60–1.00)
Glucose, Bld: 140 mg/dL — ABNORMAL HIGH (ref 65–99)
Potassium: 2.9 mmol/L — ABNORMAL LOW (ref 3.5–5.3)
Sodium: 141 mmol/L (ref 135–146)

## 2021-08-22 ENCOUNTER — Ambulatory Visit: Payer: Self-pay | Admitting: Neurology

## 2021-08-28 ENCOUNTER — Telehealth: Payer: Self-pay | Admitting: *Deleted

## 2021-08-28 NOTE — Telephone Encounter (Signed)
Dr Ethlyn Gallery received a fax from [place] with [facility] stating Dr Pearson Grippe reviewed, rated the patients chart and advised she stop taking Chlorthalidone.  Dr Ethlyn Gallery asked if we can get the full office note, recommended I check with the patient to make sure she has stopped if that is his desire.  She stated this will help with potassium levels as well, patient needs to have BP monitoring though so keep close follow up for BP management.  Spoke with [facility] and she stated the patient has not been seen yet, has an appt on 1/23 and the note will be sent a few days after the visit.  Patient was informed of this and recommendations were sent to the patient via Mychart message per patients request.

## 2021-08-29 ENCOUNTER — Ambulatory Visit (INDEPENDENT_AMBULATORY_CARE_PROVIDER_SITE_OTHER): Payer: Medicare Other | Admitting: Family Medicine

## 2021-08-29 ENCOUNTER — Encounter: Payer: Self-pay | Admitting: Family Medicine

## 2021-08-29 VITALS — BP 130/70 | HR 80 | Temp 98.2°F | Resp 18 | Ht 66.0 in | Wt 262.4 lb

## 2021-08-29 DIAGNOSIS — E785 Hyperlipidemia, unspecified: Secondary | ICD-10-CM

## 2021-08-29 DIAGNOSIS — I7381 Erythromelalgia: Secondary | ICD-10-CM

## 2021-08-29 DIAGNOSIS — M069 Rheumatoid arthritis, unspecified: Secondary | ICD-10-CM

## 2021-08-29 DIAGNOSIS — I10 Essential (primary) hypertension: Secondary | ICD-10-CM | POA: Diagnosis not present

## 2021-08-29 DIAGNOSIS — E876 Hypokalemia: Secondary | ICD-10-CM

## 2021-08-29 DIAGNOSIS — R319 Hematuria, unspecified: Secondary | ICD-10-CM | POA: Diagnosis not present

## 2021-08-29 LAB — BASIC METABOLIC PANEL
BUN: 22 mg/dL (ref 6–23)
CO2: 37 mEq/L — ABNORMAL HIGH (ref 19–32)
Calcium: 9.9 mg/dL (ref 8.4–10.5)
Chloride: 92 mEq/L — ABNORMAL LOW (ref 96–112)
Creatinine, Ser: 1.36 mg/dL — ABNORMAL HIGH (ref 0.40–1.20)
GFR: 38.88 mL/min — ABNORMAL LOW (ref >60.00)
Glucose, Bld: 116 mg/dL — ABNORMAL HIGH (ref 70–99)
Potassium: 3 mEq/L — ABNORMAL LOW (ref 3.5–5.1)
Sodium: 137 mEq/L (ref 135–145)

## 2021-08-29 LAB — LIPID PANEL
Cholesterol: 160 mg/dL (ref 0–200)
HDL: 42.3 mg/dL (ref >39.00)
LDL Cholesterol: 88 mg/dL (ref 0–99)
NonHDL: 117.3
Total CHOL/HDL Ratio: 4
Triglycerides: 149 mg/dL (ref 0.0–149.0)
VLDL: 29.8 mg/dL (ref 0.0–40.0)

## 2021-08-29 NOTE — Patient Instructions (Addendum)
Ok to double up on folate (617mcg daily); continue with B12 supplementation.   Consider trying salonpas back patches for pain  Check blood pressure regularly at home: let me know if you are regularly seeing blood pressures over 897 systolic or over 85 diastolic

## 2021-08-29 NOTE — Progress Notes (Signed)
[name] DOB: 1950-10-31 Encounter date: 08/29/2021  This is a 71 y.o. female who presents with Chief Complaint  Patient presents with   2 week f/u    No concerns.     History of present illness:  Patient is here for follow up from virtual visit with Dr. Maudie Mercury on 12/20 for dysuria. Lower back was hurting, but that is chronic. Had noted some blood in urine. She keeps pain in that lower back. Tried a roll on pain reliever that has been working for her - pain away. Feels that lower back has been bothering her at least a year. Always hurt, just worse now. Also some deconditioning.   Legs and feet are doing really well. She is going to instride and after treatment for fungus and then having treatment for venous insufficiency, she is in a much better place. Still gets a little swelling in legs, but not like they were.not getting as hot as they were. She can walk now, but back is limiting factor.   Last in office visit with me was: 07/30/19. She has had multiple ongoing medical problems.  Worsening kidney function - GFR with steady decrease most recently 37. She has visit with nephrology in the next 2 weeks. They recommended on chart review stopping chlorthalidone. She has struggled with hypokalemia as well. Additionally taking lasix, losartan 100mg , toprol 50mg .  HL: crestor 5mg  daily  Rheumatoid arthritis: ended up switching to rheumatologist that was closer to her when she couldn't get around as well. Previously saw Dr. Estanislado Pandy. Currently on plaquenil and sulfasalazine. All joints hurt when she started medications, but now joints feel better and back is bothering her more. Prior back surgery by Dr. Vertell Limber. This was only back specialist that she saw. Pain goes across lower back. Hips feel numb to her. Has noted this in past 6 months.always sleeps on side. Hips are equally numb. No weakness in legs/hips. No problems with bowel/bladder control. She was worried about possible side effects with  mobic, so didn't want to take this. She had talked with Dr. Vertell Limber and he didn't think it was her back, but thought more nerve situation. She discussed with Dr. Posey Pronto and that was reason for EMG but she couldn't complete this. She is scheduled to do hand eval next month.    EMG attempted on 12/22 but was not tolerated due to pain.   She has done well with the b12 supplement; but she was doing a 3000 mcg. Folate she was taking was 351mcg.   Stopped the chlorthalidone yesterday. Does have bp cuff at home. Taking lasix 2-3 in morning and 2 at night. She is drinking a lot of water.  Did double up on potassium and is back at twice daily.     Allergies  Allergen Reactions   Amlodipine Besylate Other (See Comments)    Tremors   Depakote [Divalproex Sodium] Swelling   Nortriptyline Other (See Comments)    tremors   Dilaudid [Hydromorphone Hcl]     Headache, muscle tightness   Tramadol     Felt "stoned"   Current Meds  Medication Sig   aspirin 81 MG tablet Take 81 mg by mouth at bedtime.    chlorthalidone (HYGROTON) 25 MG tablet Take 1 tablet (25 mg total) by mouth daily.   Cholecalciferol (VITAMIN D) 125 MCG (5000 UT) CAPS Take 5,000 Units by mouth daily.   diclofenac sodium (VOLTAREN) 1 % GEL Apply 2 g topically 4 (four) times daily as needed (pain).  Fexofenadine HCl (MUCINEX ALLERGY PO) Take by mouth daily as needed.   furosemide (LASIX) 20 MG tablet Take 2 - 3 tablets by mouth 2 times daily as needed for edema. (need appt for refills)   gabapentin (NEURONTIN) 100 MG capsule TAKE 3 CAPSULES IN THE MORNING AND 2 CAPSULES AT BEDTIME. OK TO TAKE EXTRA CAPSULE AT BEDTIME AS NEEDED   hydroxychloroquine (PLAQUENIL) 200 MG tablet Take 1 tablet by mouth with food or milk twice a day   hydroxypropyl methylcellulose / hypromellose (ISOPTO TEARS / GONIOVISC) 2.5 % ophthalmic solution Place 1 drop into both eyes daily.   losartan (COZAAR) 100 MG tablet TAKE 1 TABLET BY MOUTH ONCE A DAY    metoprolol succinate (TOPROL-XL) 50 MG 24 hr tablet TAKE 1 TABLET BY MOUTH TWICE DAILY   potassium chloride SA (KLOR-CON M) 20 MEQ tablet Take 1 tablet (20 mEq total) by mouth 2 (two) times daily.   rosuvastatin (CRESTOR) 5 MG tablet Take 1 tablet (5 mg total) by mouth daily.   sulfaSALAzine (AZULFIDINE) 500 MG tablet Take 2 tablets by mouth twice a day    Review of Systems  Constitutional:  Negative for chills, fatigue and fever.  Respiratory:  Negative for cough, chest tightness, shortness of breath and wheezing.   Cardiovascular:  Negative for chest pain, palpitations and leg swelling.  Musculoskeletal:  Positive for arthralgias, back pain, gait problem and joint swelling.   Objective:  BP 130/70    Pulse 80    Temp 98.2 F (36.8 C) (Oral)    Resp 18    Ht 5\' 6"  (1.676 m)    Wt 262 lb 6.4 oz (119 kg)    SpO2 96%    BMI 42.35 kg/m   Weight: 262 lb 6.4 oz (119 kg)   BP Readings from Last 3 Encounters:  08/29/21 130/70  07/30/21 128/82  05/04/21 139/69   Wt Readings from Last 3 Encounters:  08/29/21 262 lb 6.4 oz (119 kg)  07/30/21 260 lb 9.6 oz (118.2 kg)  05/04/21 260 lb (117.9 kg)    Physical Exam Constitutional:      General: She is not in acute distress.    Appearance: She is well-developed.  Cardiovascular:     Rate and Rhythm: Normal rate and regular rhythm.     Heart sounds: Normal heart sounds. No murmur heard.   No friction rub.  Pulmonary:     Effort: Pulmonary effort is normal. No respiratory distress.     Breath sounds: Normal breath sounds. No wheezing or rales.  Musculoskeletal:     Right lower leg: No edema.     Left lower leg: No edema.  Neurological:     Mental Status: She is alert and oriented to person, place, and time.  Psychiatric:        Behavior: Behavior normal.    Assessment/Plan  1. Primary hypertension Per nephrology recommendations, chlorthalidone was stopped.  Patient's blood pressure is okay today.  I have asked her to continue to  monitor at home as this may make some difference for her overall number.  We discussed blood pressure goals today.  Stopping chlorthalidone should also help with her ongoing hypokalemia.  Rechecking blood work today.  2. Rheumatoid arthritis, involving unspecified site, unspecified whether rheumatoid factor present [facility]) Encouraged her to follow back up with rheumatology.  Overall her joint pains better than it has been in the past, but she has a few areas that tend to flare and bother her.  She is  wondering if rheumatology will address her regular arthritis in addition to the rheumatoid arthritis.  I think it is worth touching base with them to see what more they may be able to offer for joints that are causing her continuous problems.  3. Erythromelalgia ([place]) Her legs have improved tremendously since her last in office visit.  Her venous vascular surgery helped tremendously.  Edema, erythema, warmth, and pain have all resolved.  4. Hematuria, unspecified type Urinary symptoms have resolved.  5. Hypokalemia She has been taking 20 mEq twice a day.  See above. - Basic metabolic panel; Future - Basic metabolic panel  6. Hyperlipidemia, unspecified hyperlipidemia type She is been taking Crestor 5 mg daily and seems to be tolerating it.  On consideration, she worries that this may be increasing back pain, as she feels that the timing of her increased back pain started around the timing of starting the Crestor.  I have advised her to go ahead and take a 2-week break from the Crestor to see if this makes any difference with her back pain.  If no change in back pain, suggest restarting the Crestor dose.  She can let me know how she does with this. - Lipid panel; Future - Lipid panel   Return in about 3 months (around 11/27/2021) for physical exam. 45 minutes spent in chart review, exam, discussion of change in meds, follow up for back and joint pain, charting.    [name], MD

## 2021-09-05 ENCOUNTER — Other Ambulatory Visit: Payer: Self-pay | Admitting: Family Medicine

## 2021-09-05 ENCOUNTER — Other Ambulatory Visit (HOSPITAL_COMMUNITY): Payer: Self-pay

## 2021-09-05 DIAGNOSIS — R600 Localized edema: Secondary | ICD-10-CM

## 2021-09-05 MED ORDER — FUROSEMIDE 20 MG PO TABS
40.0000 mg | ORAL_TABLET | Freq: Two times a day (BID) | ORAL | 2 refills | Status: DC | PRN
Start: 2021-09-05 — End: 2021-11-06
  Filled 2021-09-05: qty 100, 17d supply, fill #0
  Filled 2021-09-24: qty 100, 17d supply, fill #1
  Filled 2021-10-17: qty 100, 17d supply, fill #2

## 2021-09-06 DIAGNOSIS — C4491 Basal cell carcinoma of skin, unspecified: Secondary | ICD-10-CM | POA: Diagnosis not present

## 2021-09-06 DIAGNOSIS — C4401 Basal cell carcinoma of skin of lip: Secondary | ICD-10-CM | POA: Diagnosis not present

## 2021-09-10 ENCOUNTER — Other Ambulatory Visit (HOSPITAL_COMMUNITY): Payer: Self-pay

## 2021-09-10 DIAGNOSIS — E210 Primary hyperparathyroidism: Secondary | ICD-10-CM | POA: Diagnosis not present

## 2021-09-10 DIAGNOSIS — R319 Hematuria, unspecified: Secondary | ICD-10-CM | POA: Diagnosis not present

## 2021-09-10 DIAGNOSIS — I129 Hypertensive chronic kidney disease with stage 1 through stage 4 chronic kidney disease, or unspecified chronic kidney disease: Secondary | ICD-10-CM | POA: Diagnosis not present

## 2021-09-10 DIAGNOSIS — N1832 Chronic kidney disease, stage 3b: Secondary | ICD-10-CM | POA: Diagnosis not present

## 2021-09-10 DIAGNOSIS — E785 Hyperlipidemia, unspecified: Secondary | ICD-10-CM | POA: Diagnosis not present

## 2021-09-10 DIAGNOSIS — M069 Rheumatoid arthritis, unspecified: Secondary | ICD-10-CM | POA: Diagnosis not present

## 2021-09-10 DIAGNOSIS — N183 Chronic kidney disease, stage 3 unspecified: Secondary | ICD-10-CM | POA: Diagnosis not present

## 2021-09-10 DIAGNOSIS — N179 Acute kidney failure, unspecified: Secondary | ICD-10-CM | POA: Diagnosis not present

## 2021-09-10 DIAGNOSIS — N390 Urinary tract infection, site not specified: Secondary | ICD-10-CM | POA: Diagnosis not present

## 2021-09-10 MED ORDER — DOXAZOSIN MESYLATE 1 MG PO TABS
1.0000 mg | ORAL_TABLET | Freq: Every day | ORAL | 12 refills | Status: DC
  Filled 2021-09-10: qty 30, 30d supply, fill #0

## 2021-09-11 ENCOUNTER — Other Ambulatory Visit (HOSPITAL_COMMUNITY): Payer: Self-pay

## 2021-09-18 ENCOUNTER — Encounter: Payer: Medicare Other | Admitting: Neurology

## 2021-09-19 ENCOUNTER — Other Ambulatory Visit (HOSPITAL_COMMUNITY): Payer: Self-pay

## 2021-09-19 DIAGNOSIS — U071 COVID-19: Secondary | ICD-10-CM

## 2021-09-19 HISTORY — DX: COVID-19: U07.1

## 2021-09-20 ENCOUNTER — Other Ambulatory Visit (HOSPITAL_COMMUNITY): Payer: Self-pay

## 2021-09-20 MED ORDER — HYDROXYCHLOROQUINE SULFATE 200 MG PO TABS
ORAL_TABLET | ORAL | 1 refills | Status: DC
  Filled 2021-09-20: qty 180, 90d supply, fill #0
  Filled 2021-12-26: qty 180, 90d supply, fill #1

## 2021-09-24 ENCOUNTER — Other Ambulatory Visit (HOSPITAL_COMMUNITY): Payer: Self-pay

## 2021-09-25 ENCOUNTER — Other Ambulatory Visit (HOSPITAL_COMMUNITY): Payer: Self-pay

## 2021-09-25 MED FILL — Gabapentin Cap 100 MG: ORAL | 90 days supply | Qty: 540 | Fill #3 | Status: AC

## 2021-10-01 ENCOUNTER — Telehealth: Payer: Self-pay | Admitting: Family Medicine

## 2021-10-01 ENCOUNTER — Other Ambulatory Visit (HOSPITAL_COMMUNITY): Payer: Self-pay

## 2021-10-01 ENCOUNTER — Telehealth (INDEPENDENT_AMBULATORY_CARE_PROVIDER_SITE_OTHER): Payer: Medicare Other | Admitting: Family Medicine

## 2021-10-01 ENCOUNTER — Encounter: Payer: Self-pay | Admitting: Family Medicine

## 2021-10-01 VITALS — Ht 66.0 in

## 2021-10-01 DIAGNOSIS — U071 COVID-19: Secondary | ICD-10-CM | POA: Diagnosis not present

## 2021-10-01 DIAGNOSIS — R110 Nausea: Secondary | ICD-10-CM

## 2021-10-01 MED ORDER — MOLNUPIRAVIR EUA 200MG CAPSULE
4.0000 | ORAL_CAPSULE | Freq: Two times a day (BID) | ORAL | 0 refills | Status: AC
  Filled 2021-10-01: qty 40, 5d supply, fill #0

## 2021-10-01 MED ORDER — ONDANSETRON HCL 4 MG PO TABS
4.0000 mg | ORAL_TABLET | Freq: Two times a day (BID) | ORAL | 0 refills | Status: AC | PRN
  Filled 2021-10-01: qty 10, 5d supply, fill #0

## 2021-10-01 NOTE — Telephone Encounter (Signed)
Patient calling in with respiratory symptoms: Shortness of breath, chest pain, palpitations or other red words send to Triage  Does the patient have a fever over 100, cough, congestion, sore throat, runny nose, lost of taste/smell (please list symptoms that patient has)?yes  What date did symptoms start?Friday 09/28/21 (If over 5 days ago, pt may be scheduled for in person visit)  Have you tested for Covid in the last 5 days? Yes   If yes, was it positive []  OR negative [] ? If positive in the last 5 days, please schedule virtual visit now. If negative, schedule for an in person OV with the next available provider if PCP has no openings. Please also let patient know they will be tested again (follow the script below)  "you will have to arrive 57mins prior to your appt time to be Covid tested. Please park in back of office at the cone & call [phone] to let the staff know you have arrived. A staff member will meet you at your car to do a rapid covid test. Once the test has resulted you will be notified by phone of your results to determine if appt will remain an in person visit or be converted to a virtual/phone visit. If you arrive less than 37mins before your appt time, your visit will be automatically converted to virtual & any recommended testing will happen AFTER the visit." Pt have a virtual on 10/01/21 at 3:30  [place]  If no availability for virtual visit in office,  please schedule another [place] office  If no availability at another [place] office, please instruct patient that they can schedule an evisit or virtual visit through their mychart account. Visits up to 8pm  patients can be seen in office 5 days after positive COVID test

## 2021-10-01 NOTE — Progress Notes (Signed)
Virtual Visit via Video Note I connected with [name] on 10/01/21 by a video enabled telemedicine application and verified that I am speaking with the correct person using two identifiers.  Location patient: home Location provider:work office Persons participating in the virtual visit: patient, provider  I discussed the limitations of evaluation and management by telemedicine and the availability of in person appointments. The patient expressed understanding and agreed to proceed.  Chief Complaint  Patient presents with   Covid Positive   HPI: [name] is a 71 yo female with hx of allergy rhinitis, hypertension, hyperparathyroidism, hyperlipidemia, and RA  on Plaquenil c/o 3 days of fatigue,decreased appetite,generalized body aches, headache, "little" cough, nausea, and diarrhea. She has not had any diarrhea today. COVID 19 home test positive yesterday.  Negative for fever,nasal congestion, rhinorrhea, ageusia, anosmia,sore throat, wheezing, dyspnea, CP, palpitations, abdominal pain, vomiting, urinary symptoms, or a skin rash. She has been taking Tylenol. She has not had COVID-19 vaccination. Her husband also diagnosed with COVID-19 infection. No recent travel.  ROS: See pertinent positives and negatives per HPI.  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   Osteopenia 10/17/2011   DEXA 09/2007: -1.4 L fem; 10/2011: -1.2 L fem    Osteoporosis    PONV (postoperative nausea and vomiting)     Pseudogout of foot    Rheumatoid arthritis(714.0) dx 2010   Shingles    hx of    Tibial plateau fracture, right, closed, initial encounter 07/16/2017    Past Surgical History:  Procedure Laterality Date   BREAST BIOPSY  1972   Broken wrist  2010   CATARACT EXTRACTION  03/2012   left   COLONOSCOPY     COSMETIC SURGERY     FRACTURE SURGERY     INNER EAR SURGERY     busted ear drum   MAXIMUM ACCESS (MAS)POSTERIOR LUMBAR INTERBODY FUSION (PLIF) 1 LEVEL N/A 10/11/2016   Procedure: Lumbar one-Sacral one Maximum access posterior lumbar interbody fusion;  Surgeon: Erline Levine, MD;  Location: [facility];  Service: Neurosurgery;  Laterality: N/A;   ORIF WRIST FRACTURE Right 07/18/2017   Procedure: OPEN REDUCTION INTERNAL FIXATION (ORIF) WRIST FRACTURE;  Surgeon: Renette Butters, MD;  Location: [facility];  Service: Orthopedics;  Laterality: Right;   PARATHYROIDECTOMY Left 04/12/2019   Procedure: LEFT SUPERIOR PARATHYROIDECTOMY;  Surgeon: [name], MD;  Location: [facility];  Service: General;  Laterality: Left;   pneumonia  2007    Family History  Problem Relation Age of Onset   Stroke Mother    Hypertension Father    Heart attack Father    Hypertension Sister        x 3   Hypertension Brother    Hyperthyroidism Sister        x2, s/p RAI ablation   Hypothyroidism Brother    Colon cancer Neg Hx    Esophageal cancer Neg Hx  Rectal cancer Neg Hx    Stomach cancer Neg Hx     Social History   Socioeconomic History   Marital status: Married    Spouse name: Not on file   Number of children: 3   Years of education: Not on file   Highest education level: Not on file  Occupational History   Occupation: Retired  Tobacco Use   Smoking status: Former    Packs/day: 4.00    Years: 4.00    Pack years: 16.00    Types: Cigarettes    Start date: 23    Quit date: 1985    Years since quitting: 38.1   Smokeless tobacco: Never  Vaping Use   Vaping Use: Never used  Substance and Sexual  Activity   Alcohol use: No    Alcohol/week: 0.0 standard drinks   Drug use: No   Sexual activity: Not on file  Other Topics Concern   Not on file  Social History Narrative   Artist -retired Building control surveyor   Married, lives with spouse, [name], he is IT support for [employer]   3 sons   2 caffeinated beverages a day   No regular exercise, diet is ok   Right handed   Social Determinants of Health   Financial Resource Strain: Not on file  Food Insecurity: Not on file  Transportation Needs: Not on file  Physical Activity: Not on file  Stress: Not on file  Social Connections: Not on file  Intimate Partner Violence: Not on file   Current Outpatient Medications:    aspirin 81 MG tablet, Take 81 mg by mouth at bedtime. , Disp: , Rfl:    Cholecalciferol (VITAMIN D) 125 MCG (5000 UT) CAPS, Take 5,000 Units by mouth daily., Disp: , Rfl:    diclofenac sodium (VOLTAREN) 1 % GEL, Apply 2 g topically 4 (four) times daily as needed (pain)., Disp: , Rfl:    doxazosin (CARDURA) 1 MG tablet, Take 1 tablet by mouth daily, Disp: 30 tablet, Rfl: 12   Fexofenadine HCl (MUCINEX ALLERGY PO), Take by mouth daily as needed., Disp: , Rfl:    furosemide (LASIX) 20 MG tablet, Take 2 - 3 tablets by mouth 2 times daily as needed for edema. (need appt for refills), Disp: 100 tablet, Rfl: 2   gabapentin (NEURONTIN) 100 MG capsule, TAKE 3 CAPSULES IN THE MORNING AND 2 CAPSULES AT BEDTIME. OK TO TAKE EXTRA CAPSULE AT BEDTIME AS NEEDED, Disp: 540 capsule, Rfl: 3   hydroxychloroquine (PLAQUENIL) 200 MG tablet, Take 1 tablet by mouth with food or milk twice a day, Disp: 180 tablet, Rfl: 1   hydroxypropyl methylcellulose / hypromellose (ISOPTO TEARS / GONIOVISC) 2.5 % ophthalmic solution, Place 1 drop into both eyes daily., Disp: , Rfl:    losartan (COZAAR) 100 MG tablet, TAKE 1 TABLET BY MOUTH ONCE A DAY, Disp: 90 tablet, Rfl: 1   metoprolol succinate (TOPROL-XL) 50 MG 24 hr tablet, TAKE 1 TABLET BY  MOUTH TWICE DAILY, Disp: 180 tablet, Rfl: 0   potassium chloride SA (KLOR-CON M) 20 MEQ tablet, Take 1 tablet (20 mEq total) by mouth 2 (two) times daily., Disp: 180 tablet, Rfl: 1   rosuvastatin (CRESTOR) 5 MG tablet, Take 1 tablet (5 mg total) by mouth daily., Disp: 90 tablet, Rfl: 1   sulfaSALAzine (AZULFIDINE) 500 MG tablet, Take 2 tablets by mouth twice a day, Disp: 360 tablet, Rfl: 1  EXAM:  VITALS per patient if applicable:Ht 5\' 6"  (1.676 m)  BMI 42.35 kg/m   GENERAL: alert, oriented, appears well and in no acute distress  HEENT: atraumatic, conjunctiva clear, no obvious abnormalities on inspection of external nose and ears  NECK: normal movements of the head and neck  LUNGS: on inspection no signs of respiratory distress, breathing rate appears normal, no obvious gross SOB, gasping or wheezing  CV: no obvious cyanosis  MS: moves all visible extremities without noticeable abnormality  PSYCH/NEURO: pleasant and cooperative, no obvious depression or anxiety, speech and thought processing grossly intact  ASSESSMENT AND PLAN:  Discussed the following assessment and plan:  COVID-19 virus infection - Plan: molnupiravir EUA (LAGEVRIO) 200 mg CAPS capsule, MyChart COVID-19 home monitoring program Mainly GI symptoms. We discussed Dx,possible complications and treatment options. She has a mild to moderate case with risk for complications. We discussed oral antiviral options and side effects. She agrees with trying Molnupiravir. Symptomatic treatment with plenty of fluids,rest,tylenol 500 mg 3-4 times per day prn. 5 to 7 days of quarantine recommended. Clearly instructed about warning signs.  Nausea without vomiting - Plan: ondansetron (ZOFRAN) 4 MG tablet Zofran recommended for symptomatic treatment. Adequate hydration, small sips of clear fluids with electrolytes, Gatorade and water 1:1 ratio is an appropriate option while having diarrhea. Clearly instructed about warning  signs.  We discussed possible serious and likely etiologies, options for evaluation and workup, limitations of telemedicine visit vs in person visit, treatment, treatment risks and precautions. The patient was advised to call back or seek an in-person evaluation if the symptoms worsen or if the condition fails to improve as anticipated. I discussed the assessment and treatment plan with the patient. [name] was provided an opportunity to ask questions and all were answered. She agreed with the plan and demonstrated an understanding of the instructions.  Return if symptoms worsen or fail to improve.  Frankey Botting G. [place], MD  [facility]. [place] office.

## 2021-10-04 DIAGNOSIS — R110 Nausea: Secondary | ICD-10-CM | POA: Diagnosis not present

## 2021-10-04 DIAGNOSIS — R509 Fever, unspecified: Secondary | ICD-10-CM | POA: Diagnosis not present

## 2021-10-04 DIAGNOSIS — U071 COVID-19: Secondary | ICD-10-CM | POA: Diagnosis not present

## 2021-10-04 DIAGNOSIS — R1111 Vomiting without nausea: Secondary | ICD-10-CM | POA: Diagnosis not present

## 2021-10-04 DIAGNOSIS — R1084 Generalized abdominal pain: Secondary | ICD-10-CM | POA: Diagnosis not present

## 2021-10-10 ENCOUNTER — Encounter (HOSPITAL_BASED_OUTPATIENT_CLINIC_OR_DEPARTMENT_OTHER): Payer: Self-pay | Admitting: Urology

## 2021-10-10 ENCOUNTER — Inpatient Hospital Stay (HOSPITAL_BASED_OUTPATIENT_CLINIC_OR_DEPARTMENT_OTHER)
Admission: EM | Admit: 2021-10-10 | Discharge: 2021-10-15 | DRG: 177 | Disposition: A | Discharge status: Home / Self Care | Payer: Medicare Other | Attending: Internal Medicine | Admitting: Internal Medicine

## 2021-10-10 ENCOUNTER — Emergency Department (HOSPITAL_BASED_OUTPATIENT_CLINIC_OR_DEPARTMENT_OTHER): Payer: Medicare Other

## 2021-10-10 ENCOUNTER — Telehealth (INDEPENDENT_AMBULATORY_CARE_PROVIDER_SITE_OTHER): Payer: Medicare Other | Admitting: Family Medicine

## 2021-10-10 ENCOUNTER — Encounter: Payer: Self-pay | Admitting: Family Medicine

## 2021-10-10 ENCOUNTER — Telehealth: Payer: Self-pay | Admitting: *Deleted

## 2021-10-10 ENCOUNTER — Other Ambulatory Visit: Payer: Self-pay

## 2021-10-10 DIAGNOSIS — G629 Polyneuropathy, unspecified: Secondary | ICD-10-CM | POA: Diagnosis present

## 2021-10-10 DIAGNOSIS — Z7982 Long term (current) use of aspirin: Secondary | ICD-10-CM | POA: Diagnosis not present

## 2021-10-10 DIAGNOSIS — E871 Hypo-osmolality and hyponatremia: Secondary | ICD-10-CM | POA: Diagnosis not present

## 2021-10-10 DIAGNOSIS — R110 Nausea: Secondary | ICD-10-CM

## 2021-10-10 DIAGNOSIS — R5383 Other fatigue: Secondary | ICD-10-CM | POA: Diagnosis not present

## 2021-10-10 DIAGNOSIS — I517 Cardiomegaly: Secondary | ICD-10-CM | POA: Diagnosis not present

## 2021-10-10 DIAGNOSIS — R197 Diarrhea, unspecified: Secondary | ICD-10-CM | POA: Diagnosis not present

## 2021-10-10 DIAGNOSIS — J9601 Acute respiratory failure with hypoxia: Secondary | ICD-10-CM | POA: Diagnosis present

## 2021-10-10 DIAGNOSIS — R911 Solitary pulmonary nodule: Secondary | ICD-10-CM | POA: Diagnosis not present

## 2021-10-10 DIAGNOSIS — J1282 Pneumonia due to coronavirus disease 2019: Secondary | ICD-10-CM | POA: Diagnosis not present

## 2021-10-10 DIAGNOSIS — E876 Hypokalemia: Secondary | ICD-10-CM | POA: Diagnosis not present

## 2021-10-10 DIAGNOSIS — R9431 Abnormal electrocardiogram [ECG] [EKG]: Secondary | ICD-10-CM | POA: Diagnosis present

## 2021-10-10 DIAGNOSIS — Z87891 Personal history of nicotine dependence: Secondary | ICD-10-CM | POA: Diagnosis not present

## 2021-10-10 DIAGNOSIS — M810 Age-related osteoporosis without current pathological fracture: Secondary | ICD-10-CM | POA: Diagnosis not present

## 2021-10-10 DIAGNOSIS — I10 Essential (primary) hypertension: Secondary | ICD-10-CM | POA: Diagnosis not present

## 2021-10-10 DIAGNOSIS — U099 Post covid-19 condition, unspecified: Secondary | ICD-10-CM | POA: Diagnosis not present

## 2021-10-10 DIAGNOSIS — Z8701 Personal history of pneumonia (recurrent): Secondary | ICD-10-CM

## 2021-10-10 DIAGNOSIS — M19049 Primary osteoarthritis, unspecified hand: Secondary | ICD-10-CM | POA: Diagnosis present

## 2021-10-10 DIAGNOSIS — U071 COVID-19: Secondary | ICD-10-CM | POA: Diagnosis not present

## 2021-10-10 DIAGNOSIS — I150 Renovascular hypertension: Secondary | ICD-10-CM | POA: Diagnosis not present

## 2021-10-10 DIAGNOSIS — J90 Pleural effusion, not elsewhere classified: Secondary | ICD-10-CM | POA: Diagnosis not present

## 2021-10-10 DIAGNOSIS — M7989 Other specified soft tissue disorders: Secondary | ICD-10-CM | POA: Diagnosis present

## 2021-10-10 DIAGNOSIS — Z28310 Unvaccinated for covid-19: Secondary | ICD-10-CM | POA: Diagnosis not present

## 2021-10-10 DIAGNOSIS — I5031 Acute diastolic (congestive) heart failure: Secondary | ICD-10-CM | POA: Diagnosis not present

## 2021-10-10 DIAGNOSIS — D649 Anemia, unspecified: Secondary | ICD-10-CM | POA: Diagnosis present

## 2021-10-10 DIAGNOSIS — M8580 Other specified disorders of bone density and structure, unspecified site: Secondary | ICD-10-CM | POA: Diagnosis not present

## 2021-10-10 DIAGNOSIS — Z8249 Family history of ischemic heart disease and other diseases of the circulatory system: Secondary | ICD-10-CM

## 2021-10-10 DIAGNOSIS — E892 Postprocedural hypoparathyroidism: Secondary | ICD-10-CM | POA: Diagnosis present

## 2021-10-10 DIAGNOSIS — M069 Rheumatoid arthritis, unspecified: Secondary | ICD-10-CM | POA: Diagnosis present

## 2021-10-10 DIAGNOSIS — Z79899 Other long term (current) drug therapy: Secondary | ICD-10-CM

## 2021-10-10 DIAGNOSIS — I4891 Unspecified atrial fibrillation: Secondary | ICD-10-CM | POA: Diagnosis not present

## 2021-10-10 DIAGNOSIS — Z6841 Body Mass Index (BMI) 40.0 and over, adult: Secondary | ICD-10-CM | POA: Diagnosis not present

## 2021-10-10 DIAGNOSIS — Z981 Arthrodesis status: Secondary | ICD-10-CM

## 2021-10-10 DIAGNOSIS — R0902 Hypoxemia: Secondary | ICD-10-CM | POA: Diagnosis not present

## 2021-10-10 DIAGNOSIS — Z885 Allergy status to narcotic agent status: Secondary | ICD-10-CM

## 2021-10-10 DIAGNOSIS — Z888 Allergy status to other drugs, medicaments and biological substances status: Secondary | ICD-10-CM

## 2021-10-10 DIAGNOSIS — R0602 Shortness of breath: Secondary | ICD-10-CM | POA: Diagnosis not present

## 2021-10-10 LAB — CBC WITH DIFFERENTIAL/PLATELET
Abs Immature Granulocytes: 0.09 10*3/uL — ABNORMAL HIGH (ref 0.00–0.07)
Basophils Absolute: 0 10*3/uL (ref 0.0–0.1)
Basophils Relative: 0 %
Eosinophils Absolute: 0 10*3/uL (ref 0.0–0.5)
Eosinophils Relative: 0 %
HCT: 32.5 % — ABNORMAL LOW (ref 36.0–46.0)
Hemoglobin: 11.3 g/dL — ABNORMAL LOW (ref 12.0–15.0)
Immature Granulocytes: 2 %
Lymphocytes Relative: 12 %
Lymphs Abs: 0.5 10*3/uL — ABNORMAL LOW (ref 0.7–4.0)
MCH: 28 pg (ref 26.0–34.0)
MCHC: 34.8 g/dL (ref 30.0–36.0)
MCV: 80.6 fL (ref 80.0–100.0)
Monocytes Absolute: 0.2 10*3/uL (ref 0.1–1.0)
Monocytes Relative: 6 %
Neutro Abs: 3.2 10*3/uL (ref 1.7–7.7)
Neutrophils Relative %: 80 %
Platelets: 277 10*3/uL (ref 150–400)
RBC: 4.03 MIL/uL (ref 3.87–5.11)
RDW: 12.4 % (ref 11.5–15.5)
WBC: 4.1 10*3/uL (ref 4.0–10.5)
nRBC: 0 % (ref 0.0–0.2)

## 2021-10-10 LAB — COMPREHENSIVE METABOLIC PANEL
ALT: 21 U/L (ref 0–44)
AST: 29 U/L (ref 15–41)
Albumin: 3.3 g/dL — ABNORMAL LOW (ref 3.5–5.0)
Alkaline Phosphatase: 47 U/L (ref 38–126)
Anion gap: 10 (ref 5–15)
BUN: 10 mg/dL (ref 8–23)
CO2: 35 mmol/L — ABNORMAL HIGH (ref 22–32)
Calcium: 8.6 mg/dL — ABNORMAL LOW (ref 8.9–10.3)
Chloride: 87 mmol/L — ABNORMAL LOW (ref 98–111)
Creatinine, Ser: 0.9 mg/dL (ref 0.44–1.00)
GFR, Estimated: >60 mL/min (ref >60)
Glucose, Bld: 117 mg/dL — ABNORMAL HIGH (ref 70–99)
Potassium: 2.5 mmol/L — CL (ref 3.5–5.1)
Sodium: 132 mmol/L — ABNORMAL LOW (ref 135–145)
Total Bilirubin: 0.8 mg/dL (ref 0.3–1.2)
Total Protein: 6.3 g/dL — ABNORMAL LOW (ref 6.5–8.1)

## 2021-10-10 LAB — RESP PANEL BY RT-PCR (FLU A&B, COVID) ARPGX2
Influenza A by PCR: NEGATIVE
Influenza B by PCR: NEGATIVE
SARS Coronavirus 2 by RT PCR: POSITIVE — AB

## 2021-10-10 LAB — TROPONIN I (HIGH SENSITIVITY): Troponin I (High Sensitivity): 18 ng/L — ABNORMAL HIGH (ref <18)

## 2021-10-10 LAB — MAGNESIUM: Magnesium: 1.7 mg/dL (ref 1.7–2.4)

## 2021-10-10 LAB — BRAIN NATRIURETIC PEPTIDE: B Natriuretic Peptide: 428.4 pg/mL — ABNORMAL HIGH (ref 0.0–100.0)

## 2021-10-10 MED ORDER — POTASSIUM CHLORIDE 10 MEQ/100ML IV SOLN
10.0000 meq | INTRAVENOUS | Status: AC
  Administered 2021-10-11 (×2): 10 meq via INTRAVENOUS
  Filled 2021-10-10 (×2): qty 100

## 2021-10-10 MED ORDER — POTASSIUM CHLORIDE CRYS ER 20 MEQ PO TBCR
20.0000 meq | EXTENDED_RELEASE_TABLET | Freq: Every day | ORAL | Status: DC
Start: 2021-10-11 — End: 2021-10-11

## 2021-10-10 MED ORDER — PREDNISONE 50 MG PO TABS
50.0000 mg | ORAL_TABLET | Freq: Every day | ORAL | Status: DC
  Administered 2021-10-14 – 2021-10-15 (×2): 50 mg via ORAL
  Filled 2021-10-10 (×2): qty 1

## 2021-10-10 MED ORDER — GUAIFENESIN-DM 100-10 MG/5ML PO SYRP: 10.0000 mL | ORAL_SOLUTION | ORAL | Status: DC | PRN

## 2021-10-10 MED ORDER — MAGNESIUM SULFATE IN D5W 1-5 GM/100ML-% IV SOLN
1.0000 g | Freq: Once | INTRAVENOUS | Status: AC
  Administered 2021-10-11: 1 g via INTRAVENOUS
  Filled 2021-10-10: qty 100

## 2021-10-10 MED ORDER — METHYLPREDNISOLONE SODIUM SUCC 125 MG IJ SOLR
1.0000 mg/kg | Freq: Two times a day (BID) | INTRAMUSCULAR | Status: AC
  Administered 2021-10-11 – 2021-10-13 (×6): 116.875 mg via INTRAVENOUS
  Filled 2021-10-10 (×6): qty 2

## 2021-10-10 MED ORDER — SODIUM CHLORIDE 0.9 % IV SOLN
2.0000 g | Freq: Once | INTRAVENOUS | Status: AC
  Administered 2021-10-10: 2 g via INTRAVENOUS
  Filled 2021-10-10: qty 20

## 2021-10-10 MED ORDER — SODIUM CHLORIDE 0.9% FLUSH
3.0000 mL | Freq: Two times a day (BID) | INTRAVENOUS | Status: DC
  Administered 2021-10-11 – 2021-10-15 (×10): 3 mL via INTRAVENOUS

## 2021-10-10 MED ORDER — OXYCODONE HCL 5 MG PO TABS: 2.5000 mg | ORAL_TABLET | Freq: Four times a day (QID) | ORAL | Status: DC | PRN

## 2021-10-10 MED ORDER — METHYLPREDNISOLONE SODIUM SUCC 125 MG IJ SOLR
125.0000 mg | Freq: Every day | INTRAMUSCULAR | Status: DC
  Administered 2021-10-10: 125 mg via INTRAVENOUS
  Filled 2021-10-10: qty 2

## 2021-10-10 MED ORDER — POTASSIUM CHLORIDE CRYS ER 20 MEQ PO TBCR
40.0000 meq | EXTENDED_RELEASE_TABLET | Freq: Once | ORAL | Status: AC
  Administered 2021-10-10: 40 meq via ORAL
  Filled 2021-10-10: qty 2

## 2021-10-10 MED ORDER — IOHEXOL 350 MG/ML SOLN
100.0000 mL | Freq: Once | INTRAVENOUS | Status: AC | PRN
  Administered 2021-10-10: 100 mL via INTRAVENOUS

## 2021-10-10 MED ORDER — SODIUM CHLORIDE 0.9 % IV BOLUS
1000.0000 mL | Freq: Once | INTRAVENOUS | Status: AC
  Administered 2021-10-10: 1000 mL via INTRAVENOUS

## 2021-10-10 MED ORDER — DOXYCYCLINE HYCLATE 100 MG PO TABS
100.0000 mg | ORAL_TABLET | Freq: Two times a day (BID) | ORAL | Status: DC
  Administered 2021-10-10 – 2021-10-15 (×10): 100 mg via ORAL
  Filled 2021-10-10 (×10): qty 1

## 2021-10-10 MED ORDER — POTASSIUM CHLORIDE 10 MEQ/100ML IV SOLN
10.0000 meq | INTRAVENOUS | Status: AC
  Administered 2021-10-10 (×2): 10 meq via INTRAVENOUS
  Filled 2021-10-10 (×2): qty 100

## 2021-10-10 MED ORDER — ACETAMINOPHEN 325 MG PO TABS
650.0000 mg | ORAL_TABLET | Freq: Four times a day (QID) | ORAL | Status: DC | PRN
  Administered 2021-10-10 – 2021-10-14 (×2): 650 mg via ORAL
  Filled 2021-10-10 (×2): qty 2

## 2021-10-10 MED ORDER — ONDANSETRON HCL 4 MG/2ML IJ SOLN
4.0000 mg | Freq: Once | INTRAMUSCULAR | Status: AC
  Administered 2021-10-10: 4 mg via INTRAVENOUS
  Filled 2021-10-10: qty 2

## 2021-10-10 MED ORDER — ENOXAPARIN SODIUM 40 MG/0.4ML IJ SOSY
40.0000 mg | PREFILLED_SYRINGE | INTRAMUSCULAR | Status: DC
  Administered 2021-10-11 – 2021-10-15 (×5): 40 mg via SUBCUTANEOUS
  Filled 2021-10-10 (×5): qty 0.4

## 2021-10-10 NOTE — H&P (Signed)
History and Physical    [name] VQQ:[id number] DOB: 03-Jun-1951 DOA: 10/10/2021  PCP: Caren Macadam, MD   Patient coming from: Home   Chief Complaint: Fatigue, aches, loss of appetite, nausea, loose stools   HPI: [name] is a very pleasant 71 y.o. female with medical history significant for rheumatoid arthritis, hypertension, and BMI 43, now presenting to the emergency department with fatigue, aches, nausea, loose stool, and loss of appetite.  The patient reports that she developed severe aches and fatigue on 09/30/2020, had positive home COVID test, was evaluated by her PCP the following day and started on molnupiravir.  She has continued to experience severe fatigue, aches, loss of appetite, nausea, dry heaves, and loose stool since then.  She denies any fevers, chills, or chest pain.  She denies abdominal pain.  She reports that her chronic bilateral lower extremity swelling seems to be at baseline.  [facility] ED Course: Upon arrival to the ED, patient is found to be afebrile, saturating upper 80s on room air, found modestly hypertensive.  EKG features sinus rhythm with QTc interval 528 ms.  Chest x-ray notable for reticulnodular opacities bilaterally, left upper lobe nodule, and tiny right pleural effusion.  CBC features a sodium 132, potassium 2.5, and bicarbonate 35.  CBC with mild normocytic anemia.  BNP elevated 428.  High-sensitivity troponin slightly elevated.  COVID-19 PCR positive.  The patient was treated with Rocephin, doxycycline, IV Solu-Medrol, IV and oral potassium, and a liter of saline before she was transferred to [facility] for admission.  Review of Systems:  All other systems reviewed and apart from HPI, are negative.  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   Osteopenia 10/17/2011   DEXA 09/2007: -1.4 L fem; 10/2011: -1.2 L fem    Osteoporosis    PONV (postoperative nausea and vomiting)    Pseudogout of foot    Rheumatoid arthritis(714.0) dx 2010   Shingles    hx of    Tibial plateau fracture, right, closed, initial encounter 07/16/2017    Past Surgical History:  Procedure Laterality Date   BREAST BIOPSY  1972   Broken wrist  2010   CATARACT EXTRACTION  03/2012   left   COLONOSCOPY     COSMETIC SURGERY     FRACTURE SURGERY     INNER EAR SURGERY     busted ear drum   MAXIMUM ACCESS (MAS)POSTERIOR LUMBAR INTERBODY FUSION (PLIF) 1 LEVEL N/A 10/11/2016   Procedure: Lumbar one-Sacral one Maximum access posterior lumbar interbody fusion;  Surgeon: Erline Levine, MD;  Location: [place];  Service: Neurosurgery;  Laterality: N/A;   ORIF WRIST FRACTURE Right 07/18/2017   Procedure: OPEN REDUCTION INTERNAL FIXATION (ORIF) WRIST FRACTURE;  Surgeon: Renette Butters, MD;  Location: [place];  Service: Orthopedics;  Laterality: Right;   PARATHYROIDECTOMY Left 04/12/2019   Procedure: LEFT SUPERIOR PARATHYROIDECTOMY;  Surgeon: Harlow Asa,  Sherren Mocha, MD;  Location: [facility];  Service: General;  Laterality: Left;   pneumonia  2007    Social History:   reports that she quit smoking about 38 years ago. Her smoking use included cigarettes. She started smoking about 42 years ago. She has a 16.00 pack-year smoking history. She has never used smokeless tobacco. She reports that she does not drink alcohol and does not use drugs.  Allergies  Allergen Reactions   Amlodipine Besylate Other (See Comments)    Tremors   Depakote [Divalproex Sodium] Swelling    Nortriptyline Other (See Comments)    tremors   Dilaudid [Hydromorphone Hcl]     Headache, muscle tightness   Tramadol     Felt "stoned"    Family History  Problem Relation Age of Onset   Stroke Mother    Hypertension Father    Heart attack Father    Hypertension Sister        x 3   Hypertension Brother    Hyperthyroidism Sister        x2, s/p RAI ablation   Hypothyroidism Brother    Colon cancer Neg Hx    Esophageal cancer Neg Hx    Rectal cancer Neg Hx    Stomach cancer Neg Hx      Prior to Admission medications   Medication Sig Start Date End Date Taking? Authorizing Provider  aspirin 81 MG tablet Take 81 mg by mouth at bedtime.    Yes [provider]  diclofenac sodium (VOLTAREN) 1 % GEL Apply 2 g topically 4 (four) times daily as needed (pain).   Yes [provider]  doxazosin (CARDURA) 1 MG tablet Take 1 tablet by mouth daily 09/10/21  Yes   Fexofenadine HCl (MUCINEX ALLERGY PO) Take by mouth daily as needed.   Yes [provider]  gabapentin (NEURONTIN) 100 MG capsule TAKE 3 CAPSULES IN THE MORNING AND 2 CAPSULES AT BEDTIME. OK TO TAKE EXTRA CAPSULE AT BEDTIME AS NEEDED 11/17/20 01/13/22 Yes Patel, Donika K, DO  hydroxychloroquine (PLAQUENIL) 200 MG tablet Take 1 tablet by mouth with food or milk twice a day 09/20/21  Yes   losartan (COZAAR) 100 MG tablet TAKE 1 TABLET BY MOUTH ONCE A DAY 05/07/21 05/07/22 Yes Koberlein, [name] C, MD  metoprolol succinate (TOPROL-XL) 50 MG 24 hr tablet TAKE 1 TABLET BY MOUTH TWICE DAILY 08/02/21 08/02/22 Yes Koberlein, [name] C, MD  potassium chloride SA (KLOR-CON M) 20 MEQ tablet Take 1 tablet (20 mEq total) by mouth 2 (two) times daily. 08/10/21  Yes Caren Macadam, MD  sulfaSALAzine (AZULFIDINE) 500 MG tablet Take 2 tablets by mouth twice a day 07/10/21  Yes   Cholecalciferol (VITAMIN D) 125 MCG (5000 UT) CAPS Take 5,000 Units by mouth daily.    [provider]  furosemide (LASIX) 20 MG tablet Take 2 -  3 tablets by mouth 2 times daily as needed for edema. (need appt for refills) 09/05/21 09/05/22  Caren Macadam, MD  hydroxypropyl methylcellulose / hypromellose (ISOPTO TEARS / GONIOVISC) 2.5 % ophthalmic solution Place 1 drop into both eyes daily.    [provider]  rosuvastatin (CRESTOR) 5 MG tablet Take 1 tablet (5 mg total) by mouth daily. 06/08/21   Caren Macadam, MD    Physical Exam: Vitals:   10/10/21 2000 10/10/21 2030 10/10/21 2041 10/10/21 2241  BP: (!) 153/76 (!) 138/99 (!) 138/99 (!) 144/100  Pulse: 97 90 96 89  Resp: 16 (!) 22 10 18  Temp:    98.7 F (37.1 C)  TempSrc:    Oral  SpO2: 94% 96% 95% 93%  Weight:    117 kg  Height:    5\' 5"  (1.651 m)    Constitutional: NAD, calm  Eyes: PERTLA, lids and conjunctivae normal ENMT: Mucous membranes are moist. Posterior pharynx clear of any exudate or lesions.   Neck: supple, no masses  Respiratory: no wheezing, no crackles. No accessory muscle use.  Cardiovascular: S1 & S2 heard, regular rate and rhythm. Bilateral LE swelling. Abdomen: No distension, no tenderness, soft. Bowel sounds active.  Musculoskeletal: no clubbing / cyanosis. No joint deformity upper and lower extremities.   Skin: no significant rashes, lesions, ulcers. Warm, dry, well-perfused. Neurologic: CN 2-12 grossly intact. Moving all extremities. Alert and oriented.  Psychiatric: very pleasant. Cooperative.    Labs and Imaging on Admission: I have personally reviewed following labs and imaging studies  CBC: Recent Labs  Lab 10/10/21 1600  WBC 4.1  NEUTROABS 3.2  HGB 11.3*  HCT 32.5*  MCV 80.6  PLT 712   Basic Metabolic Panel: Recent Labs  Lab 10/10/21 1600  NA 132*  K 2.5*  CL 87*  CO2 35*  GLUCOSE 117*  BUN 10  CREATININE 0.90  CALCIUM 8.6*  MG 1.7   GFR: Estimated Creatinine Clearance: 72.3 mL/min (by C-G formula based on SCr of 0.9 mg/dL). Liver Function Tests: Recent Labs  Lab 10/10/21 1600  AST 29  ALT 21   ALKPHOS 47  BILITOT 0.8  PROT 6.3*  ALBUMIN 3.3*   No results for input(s): LIPASE, AMYLASE in the last 168 hours. No results for input(s): AMMONIA in the last 168 hours. Coagulation Profile: No results for input(s): INR, PROTIME in the last 168 hours. Cardiac Enzymes: No results for input(s): CKTOTAL, CKMB, CKMBINDEX, TROPONINI in the last 168 hours. BNP (last 3 results) No results for input(s): PROBNP in the last 8760 hours. HbA1C: No results for input(s): HGBA1C in the last 72 hours. CBG: No results for input(s): GLUCAP in the last 168 hours. Lipid Profile: No results for input(s): CHOL, HDL, LDLCALC, TRIG, CHOLHDL, LDLDIRECT in the last 72 hours. Thyroid Function Tests: No results for input(s): TSH, T4TOTAL, FREET4, T3FREE, THYROIDAB in the last 72 hours. Anemia Panel: No results for input(s): VITAMINB12, FOLATE, FERRITIN, TIBC, IRON, RETICCTPCT in the last 72 hours. Urine analysis:    Component Value Date/Time   COLORURINE YELLOW 06/28/2018 1120   APPEARANCEUR CLEAR 06/28/2018 1120   LABSPEC 1.005 06/28/2018 1120   PHURINE 6.0 06/28/2018 1120   GLUCOSEU NEGATIVE 06/28/2018 1120   GLUCOSEU NEGATIVE 04/29/2014 1244   HGBUR NEGATIVE 06/28/2018 1120   BILIRUBINUR negative 07/13/2018 1003   KETONESUR NEGATIVE 06/28/2018 1120   PROTEINUR Negative 07/13/2018 1003   PROTEINUR NEGATIVE 06/28/2018 1120   UROBILINOGEN 0.2 07/13/2018 1003   UROBILINOGEN 0.2 04/29/2014 1244   NITRITE negative 07/13/2018 1003   NITRITE NEGATIVE 06/28/2018 1120   LEUKOCYTESUR Trace (A) 07/13/2018 1003   Sepsis Labs: @LABRCNTIP (procalcitonin:4,lacticidven:4) ) Recent Results (from the past 240 hour(s))  Resp Panel by RT-PCR (Flu A&B, Covid) Nasopharyngeal Swab     Status: Abnormal   Collection Time: 10/10/21  4:00 PM   Specimen: Nasopharyngeal Swab; Nasopharyngeal(NP) swabs in vial transport medium  Result Value Ref Range Status   SARS Coronavirus 2 by RT PCR POSITIVE (A) NEGATIVE Final     Comment: (NOTE) SARS-CoV-2 target nucleic acids are DETECTED.  The SARS-CoV-2 RNA is generally detectable in upper respiratory specimens during the  acute phase of infection. Positive results are indicative of the presence of the identified virus, but do not rule out bacterial infection or co-infection with other pathogens not detected by the test. Clinical correlation with patient history and other diagnostic information is necessary to determine patient infection status. The expected result is Negative.  Fact Sheet for Patients: EntrepreneurPulse.com.au  Fact Sheet for Healthcare Providers: IncredibleEmployment.be  This test is not yet approved or cleared by the [place] FDA and  has been authorized for detection and/or diagnosis of SARS-CoV-2 by FDA under an Emergency Use Authorization (EUA).  This EUA will remain in effect (meaning this test can be used) for the duration of  the COVID-19 declaration under Section 564(b)(1) of the A ct, 21 U.S.C. section 360bbb-3(b)(1), unless the authorization is terminated or revoked sooner.     Influenza A by PCR NEGATIVE NEGATIVE Final   Influenza B by PCR NEGATIVE NEGATIVE Final    Comment: (NOTE) The Xpert Xpress SARS-CoV-2/FLU/RSV plus assay is intended as an aid in the diagnosis of influenza from Nasopharyngeal swab specimens and should not be used as a sole basis for treatment. Nasal washings and aspirates are unacceptable for Xpert Xpress SARS-CoV-2/FLU/RSV testing.  Fact Sheet for Patients: EntrepreneurPulse.com.au  Fact Sheet for Healthcare Providers: IncredibleEmployment.be  This test is not yet approved or cleared by the [place] FDA and has been authorized for detection and/or diagnosis of SARS-CoV-2 by FDA under an Emergency Use Authorization (EUA). This EUA will remain in effect (meaning this test can be used) for the duration of  the COVID-19 declaration under Section 564(b)(1) of the Act, 21 U.S.C. section 360bbb-3(b)(1), unless the authorization is terminated or revoked.  Performed at [facility], [address], [place], [place] [address]      Radiological Exams on Admission: CT Angio Chest PE W and/or Wo Contrast  Result Date: 10/10/2021 CLINICAL DATA:  COVID positive 10 days ago with worsening symptoms. EXAM: CT ANGIOGRAPHY CHEST WITH CONTRAST TECHNIQUE: Multidetector CT imaging of the chest was performed using the standard protocol during bolus administration of intravenous contrast. Multiplanar CT image reconstructions and MIPs were obtained to evaluate the vascular anatomy. RADIATION DOSE REDUCTION: This exam was performed according to the departmental dose-optimization program which includes automated exposure control, adjustment of the mA and/or kV according to patient size and/or use of iterative reconstruction technique. CONTRAST:  151mL OMNIPAQUE IOHEXOL 350 MG/ML SOLN COMPARISON:  None. FINDINGS: Cardiovascular: Satisfactory opacification of the pulmonary arteries to the segmental level. No evidence of pulmonary embolism. There is mild cardiomegaly with mild coronary artery calcification. No pericardial effusion. Mediastinum/Nodes: No enlarged mediastinal, hilar, or axillary lymph nodes. Thyroid gland, trachea, and esophagus demonstrate no significant findings. Lungs/Pleura: Marked severity multifocal infiltrates are seen throughout both lungs. Very small bilateral pleural effusions are noted. No pneumothorax is identified. Upper Abdomen: No acute abnormality. Musculoskeletal: A nondisplaced fracture deformity of the right transverse process is seen at the level of T1 (axial CT image 9, CT series 6). Multilevel degenerative changes seen throughout the thoracic spine. Review of the MIP images confirms the above findings. IMPRESSION: 1. Marked severity bilateral multifocal infiltrates. 2. Very  small bilateral pleural effusions. 3. Nondisplaced fracture deformity of the right transverse process at the level of T1. Electronically Signed   By: Virgina Norfolk M.D.   On: 10/10/2021 19:28   DG Chest Portable 1 View  Result Date: 10/10/2021 CLINICAL DATA:  Worsening COVID symptoms. Body aches, diarrhea, dry heaves, loss of appetite. Shortness of breath  with exertion. EXAM: PORTABLE CHEST 1 VIEW COMPARISON:  06/28/2018. FINDINGS: Trachea is midline. Heart size stable. Mild reticulonodular opacification in the lungs. Discrete 10 mm nodule in the left upper lung zone. Tiny right pleural effusion. IMPRESSION: 1. Reticulonodular pattern in the lungs may be due to COVID pneumonia. Recommend follow-up to clearing. 2. Discrete 10 mm nodular density in the left upper lung zone. This can be reassessed on follow-up imaging recommended above. 3. Tiny right pleural effusion. Electronically Signed   By: Lorin Picket M.D.   On: 10/10/2021 15:17    EKG: Independently reviewed. Sinus rhythm, QTc 528 ms.   Assessment/Plan   1. COVID-19; acute hypoxic respiratory failure  - Presents with fatigue, loss of appetite, nausea, and loose stools in setting of COVID infection and found to have new 3 Lpm supplemental O2 requirement  - She completed molnupiravir as outpatient and was started on steroids and antibiotics in ED  - Hypoxia and CXR findings secondary to COVID or possibly pulmonary edema; no fever, leukocytosis, or productive cough to suggest bacterial pna and no chest pain or unilateral leg swelling to suggest PE  - Continue systemic steroid, continue supplemental O2 as needed, check procalcitonin, CRP, and d-dimer    2. Hypokalemia  - Serum potassium 2.5 in setting of N/V/D  - Replaced in ED, repeat chem panel in am    3. Hypertension  - Continue losartan and metoprolol   4. RA  - Plaquenil held initially in light of prolonged QT   5. Prolonged QT interval  - QTc is 528 ms in ED  - Correct  hypokalemia, continue cardiac monitoring, avoid QT-prolonging medications    6. Lung nodule  - Noted on CXR in ED  - Outpatient follow-up recommended    DVT prophylaxis: Lovenox  Code Status: Full  Level of Care: Level of care: Telemetry Family Communication: Husband at bedside  Disposition Plan:  Patient is from: home  Anticipated d/c is to: TBD Anticipated d/c date is: 10/13/21 Patient currently: pending tolerance of adequate oral intake, improved oxygenation or home O2, may need PT eval  Consults called: none  Admission status: Inpatient     Vianne Bulls, MD [facility]  10/11/2021, 12:07 AM

## 2021-10-10 NOTE — Progress Notes (Signed)
Virtual Visit via Telephone Note  I connected with [name]  on 10/10/21 at  1:30 PM EST by telephone and verified that I am speaking with the correct person using two identifiers.   I discussed the limitations, risks, security and privacy concerns of performing an evaluation and management service by telephone and the availability of in person appointments. I also discussed with the patient that there may be a patient responsible charge related to this service. The patient expressed understanding and agreed to proceed.  Location patient: home Location provider:  [facility]  [place], [place] [address]  Participants present for the call: patient, provider Patient did not have a visit in the prior 7 days to address this/these issue(s).   History of Present Illness: States that hasn't gotten any better. Dry heaves. Diarrhea. No appetite, body aches. Doesn't think she is having fever. Feeling nauseated. One loose stool daily. She is urinating regularly.   She is drinking water and gingerale.   Sx did not get any better with antiviral for COVID.   Doesn't think that zofran helped. Family member states that it helped a little. Ran out of zofran 4-5 days ago. Was taking regularly.   No other sick symptoms - states mild cough, no sinus pain/pressure/congestion.   EMS came other night and hooked her up to IV. Said lungs were clear, oxygen was good. She did get fluids from them. They told her she wasn't dehydrated. This was on Friday.   Husband states they called EMS because she was very lethargic at home. Feels that sx are maybe slightly better from Friday.   Dizziness and lack of energy are significant. Can't get her up and down stairs.   Body hurts.    Observations/Objective: Patient sounds like she is breathing heavy and sounds very fatigued on the phone.  Speech and thought processing are grossly intact.   Assessment and Plan: 1.  Lethargy Patient does not sound well on phone. EMS eval on Friday limited. I am concerned that she needs additional eval, bloodwork which I cannot provider in a timely fashion. Last potassium was very low and although thiazide was stopped, she may need electrolyte replacement/further management. Suggested taking to [facility](5 minutes from them) for eval. If husband cannot get her in car he will call EMS.   2. Nausea See above  3. Post covid-19 condition, unspecified See above.   Follow Up Instructions: prn   [id number] 5-10 [id number] 11-20 9443 21-30 I did not refer this patient for an OV in the next 24 hours for this/these issue(s).  I discussed the assessment and treatment plan with the patient. The patient was provided an opportunity to ask questions and all were answered. The patient agreed with the plan and demonstrated an understanding of the instructions.   The patient was advised to call back or seek an in-person evaluation if the symptoms worsen or if the condition fails to improve as anticipated.  I provided 10 minutes of non-face-to-face time during this encounter.   [name], MD

## 2021-10-10 NOTE — ED Notes (Signed)
Respiratory at bedside.

## 2021-10-10 NOTE — Telephone Encounter (Signed)
-----   Message from Caren Macadam, MD sent at 10/10/2021  1:54 PM EST ----- Can you call ahead to [facility]; let them know I advised patient's husband to bring her there? 10 days post covid, lethargic, dry heaves, diarrhea, weak, dizzy. If he can't get her in car he will call EMS for transport.

## 2021-10-10 NOTE — ED Notes (Signed)
Pt given peanut butter crackers, per Shirlean Mylar - Medic

## 2021-10-10 NOTE — Telephone Encounter (Signed)
Spoke with [facility] triage nurse at the [facility] [phone]) and informed her of the information below.

## 2021-10-10 NOTE — ED Provider Notes (Signed)
[facility] DEPT Provider Note   CSN: [id number] Arrival date & time: 10/10/21  1426     History  Chief Complaint  Patient presents with   Shortness of Breath    [name] is a 71 y.o. female presenting to the ED with general malaise and viral symptoms in the setting of recent COVID diagnosis.  Supplemental history provided by husband at bedside.  They report the husband was initially sick with a viral syndrome about 15 days ago, and then 12 to 13 days ago the patient herself became sick.  10 days ago she tested positive at home COVID testing.  She has not had any of the COVID vaccines.  She has no underlying lung disease, was a former smoker for 5 years many years ago but is quit.  She does not wear oxygen at home.  She comes in complaining of nausea, loss of appetite, diarrhea, malaise, weakness, dyspnea on exertion.  HPI     Home Medications Prior to Admission medications   Medication Sig Start Date End Date Taking? Authorizing Provider  aspirin 81 MG tablet Take 81 mg by mouth at bedtime.    Yes [provider]  diclofenac sodium (VOLTAREN) 1 % GEL Apply 2 g topically 4 (four) times daily as needed (pain).   Yes [provider]  Fexofenadine HCl (MUCINEX ALLERGY PO) Take by mouth daily as needed.   Yes [provider]  furosemide (LASIX) 20 MG tablet Take 2 - 3 tablets by mouth 2 times daily as needed for edema. (need appt for refills) 09/05/21 09/05/22 Yes Koberlein, [name] C, MD  gabapentin (NEURONTIN) 100 MG capsule TAKE 3 CAPSULES IN THE MORNING AND 2 CAPSULES AT BEDTIME. OK TO TAKE EXTRA CAPSULE AT BEDTIME AS NEEDED Patient taking differently: Take 200-300 mg by mouth See admin instructions. 11/17/20 01/13/22 Yes Patel, Donika K, DO  hydroxychloroquine (PLAQUENIL) 200 MG tablet Take 1 tablet by mouth with food or milk twice a day Patient taking differently: Take 200 mg by mouth 2 (two) times daily. 09/20/21  Yes    hydroxypropyl methylcellulose / hypromellose (ISOPTO TEARS / GONIOVISC) 2.5 % ophthalmic solution Place 1 drop into both eyes daily.   Yes [provider]  losartan (COZAAR) 100 MG tablet TAKE 1 TABLET BY MOUTH ONCE A DAY 05/07/21 05/07/22 Yes Koberlein, [name] C, MD  metoprolol succinate (TOPROL-XL) 50 MG 24 hr tablet TAKE 1 TABLET BY MOUTH TWICE DAILY Patient taking differently: Take 50 mg by mouth 2 (two) times daily. 08/02/21 08/02/22 Yes Koberlein, Steele Berg, MD  potassium chloride SA (KLOR-CON M) 20 MEQ tablet Take 1 tablet (20 mEq total) by mouth 2 (two) times daily. 08/10/21  Yes Koberlein, Steele Berg, MD  sulfaSALAzine (AZULFIDINE) 500 MG tablet Take 2 tablets by mouth twice a day Patient taking differently: Take 1,000 mg by mouth 2 (two) times daily. continuous 07/10/21  Yes   Cholecalciferol (VITAMIN D) 125 MCG (5000 UT) CAPS Take 5,000 Units by mouth daily. Patient not taking: Reported on 10/11/2021    [provider]      Allergies    Amlodipine besylate, Depakote [divalproex sodium], Nortriptyline, Dilaudid [hydromorphone hcl], and Tramadol    Review of Systems   Review of Systems  Physical Exam Updated Vital Signs BP 135/82    Pulse 98    Temp 98.7 F (37.1 C) (Oral)    Resp 20    Ht 5\' 5"  (1.651 m)    Wt 117 kg  SpO2 94%    BMI 42.92 kg/m  Physical Exam Constitutional:      Comments: Appears fatigued  HENT:     Head: Normocephalic and atraumatic.  Eyes:     Conjunctiva/sclera: Conjunctivae normal.     Pupils: Pupils are equal, round, and reactive to light.  Cardiovascular:     Rate and Rhythm: Normal rate and regular rhythm.     Pulses: Normal pulses.  Pulmonary:     Effort: Pulmonary effort is normal. No respiratory distress.     Breath sounds: Normal breath sounds.     Comments: 88% on room air, 95% on 2 L nasal cannula Abdominal:     General: There is no distension.     Tenderness: There is no abdominal tenderness.  Skin:    General: Skin is  warm and dry.  Neurological:     General: No focal deficit present.     Mental Status: She is alert. Mental status is at baseline.  Psychiatric:        Mood and Affect: Mood normal.        Behavior: Behavior normal.    ED Results / Procedures / Treatments   Labs (all labs ordered are listed, but only abnormal results are displayed) Labs Reviewed  RESP PANEL BY RT-PCR (FLU A&B, COVID) ARPGX2 - Abnormal; Notable for the following components:      Result Value   SARS Coronavirus 2 by RT PCR POSITIVE (*)    All other components within normal limits  BRAIN NATRIURETIC PEPTIDE - Abnormal; Notable for the following components:   B Natriuretic Peptide 428.4 (*)    All other components within normal limits  COMPREHENSIVE METABOLIC PANEL - Abnormal; Notable for the following components:   Sodium 132 (*)    Potassium 2.5 (*)    Chloride 87 (*)    CO2 35 (*)    Glucose, Bld 117 (*)    Calcium 8.6 (*)    Total Protein 6.3 (*)    Albumin 3.3 (*)    All other components within normal limits  CBC WITH DIFFERENTIAL/PLATELET - Abnormal; Notable for the following components:   Hemoglobin 11.3 (*)    HCT 32.5 (*)    Lymphs Abs 0.5 (*)    Abs Immature Granulocytes 0.09 (*)    All other components within normal limits  C-REACTIVE PROTEIN - Abnormal; Notable for the following components:   CRP 17.7 (*)    All other components within normal limits  D-DIMER, QUANTITATIVE - Abnormal; Notable for the following components:   D-Dimer, Quant 1.22 (*)    All other components within normal limits  PHOSPHORUS - Abnormal; Notable for the following components:   Phosphorus 2.3 (*)    All other components within normal limits  BASIC METABOLIC PANEL - Abnormal; Notable for the following components:   Sodium 130 (*)    Potassium 2.8 (*)    Chloride 91 (*)    Glucose, Bld 140 (*)    Calcium 7.8 (*)    All other components within normal limits  CBC - Abnormal; Notable for the following components:    Hemoglobin 11.4 (*)    HCT 33.5 (*)    All other components within normal limits  TROPONIN I (HIGH SENSITIVITY) - Abnormal; Notable for the following components:   Troponin I (High Sensitivity) 18 (*)    All other components within normal limits  MAGNESIUM  MAGNESIUM  PROCALCITONIN    EKG EKG Interpretation  Date/Time:  Wednesday October 10 2021 14:46:00 EST Ventricular Rate:  99 PR Interval:    QRS Duration: 118 QT Interval:  411 QTC Calculation: 528 R Axis:   -37 Text Interpretation: Sinus rhythm no sig change from prior tracing, no acute ischemic changes Confirmed by Octaviano Glow [phone]) on 10/10/2021 3:21:24 PM  Radiology CT Angio Chest PE W and/or Wo Contrast  Result Date: 10/10/2021 CLINICAL DATA:  COVID positive 10 days ago with worsening symptoms. EXAM: CT ANGIOGRAPHY CHEST WITH CONTRAST TECHNIQUE: Multidetector CT imaging of the chest was performed using the standard protocol during bolus administration of intravenous contrast. Multiplanar CT image reconstructions and MIPs were obtained to evaluate the vascular anatomy. RADIATION DOSE REDUCTION: This exam was performed according to the departmental dose-optimization program which includes automated exposure control, adjustment of the mA and/or kV according to patient size and/or use of iterative reconstruction technique. CONTRAST:  188mL OMNIPAQUE IOHEXOL 350 MG/ML SOLN COMPARISON:  None. FINDINGS: Cardiovascular: Satisfactory opacification of the pulmonary arteries to the segmental level. No evidence of pulmonary embolism. There is mild cardiomegaly with mild coronary artery calcification. No pericardial effusion. Mediastinum/Nodes: No enlarged mediastinal, hilar, or axillary lymph nodes. Thyroid gland, trachea, and esophagus demonstrate no significant findings. Lungs/Pleura: Marked severity multifocal infiltrates are seen throughout both lungs. Very small bilateral pleural effusions are noted. No pneumothorax is identified.  Upper Abdomen: No acute abnormality. Musculoskeletal: A nondisplaced fracture deformity of the right transverse process is seen at the level of T1 (axial CT image 9, CT series 6). Multilevel degenerative changes seen throughout the thoracic spine. Review of the MIP images confirms the above findings. IMPRESSION: 1. Marked severity bilateral multifocal infiltrates. 2. Very small bilateral pleural effusions. 3. Nondisplaced fracture deformity of the right transverse process at the level of T1. Electronically Signed   By: Virgina Norfolk M.D.   On: 10/10/2021 19:28   DG Chest Portable 1 View  Result Date: 10/10/2021 CLINICAL DATA:  Worsening COVID symptoms. Body aches, diarrhea, dry heaves, loss of appetite. Shortness of breath with exertion. EXAM: PORTABLE CHEST 1 VIEW COMPARISON:  06/28/2018. FINDINGS: Trachea is midline. Heart size stable. Mild reticulonodular opacification in the lungs. Discrete 10 mm nodule in the left upper lung zone. Tiny right pleural effusion. IMPRESSION: 1. Reticulonodular pattern in the lungs may be due to COVID pneumonia. Recommend follow-up to clearing. 2. Discrete 10 mm nodular density in the left upper lung zone. This can be reassessed on follow-up imaging recommended above. 3. Tiny right pleural effusion. Electronically Signed   By: Lorin Picket M.D.   On: 10/10/2021 15:17    Procedures .Critical Care Performed by: Wyvonnia Dusky, MD Authorized by: Wyvonnia Dusky, MD   Critical care provider statement:    Critical care time (minutes):  45   Critical care time was exclusive of:  Separately billable procedures and treating other patients   Critical care was necessary to treat or prevent imminent or life-threatening deterioration of the following conditions:  Metabolic crisis and respiratory failure   Critical care was time spent personally by me on the following activities:  Ordering and performing treatments and interventions, ordering and review of laboratory  studies, ordering and review of radiographic studies, pulse oximetry, review of old charts, examination of patient and evaluation of patient's response to treatment   Care discussed with: admitting provider   Comments:     Hypokalemia IV repletion; hypoxia requiring supplemental oxygen    Medications Ordered in ED Medications  doxycycline (VIBRA-TABS) tablet 100 mg (100 mg Oral Given 10/11/21 0948)  acetaminophen (TYLENOL) tablet 650 mg (650 mg Oral Given 10/10/21 2327)  oxyCODONE (Oxy IR/ROXICODONE) immediate release tablet 2.5-5 mg (has no administration in time range)  enoxaparin (LOVENOX) injection 40 mg (40 mg Subcutaneous Given 10/11/21 0949)  sodium chloride flush (NS) 0.9 % injection 3 mL (3 mLs Intravenous Given 10/11/21 0022)  methylPREDNISolone sodium succinate (SOLU-MEDROL) 125 mg/2 mL injection 116.875 mg (116.875 mg Intravenous Given 10/11/21 0749)    Followed by  predniSONE (DELTASONE) tablet 50 mg (has no administration in time range)  guaiFENesin-dextromethorphan (ROBITUSSIN DM) 100-10 MG/5ML syrup 10 mL (has no administration in time range)  aspirin chewable tablet 81 mg (has no administration in time range)  doxazosin (CARDURA) tablet 1 mg (1 mg Oral Given 10/11/21 0949)  losartan (COZAAR) tablet 100 mg (100 mg Oral Given 10/11/21 0948)  metoprolol succinate (TOPROL-XL) 24 hr tablet 50 mg (50 mg Oral Given 10/11/21 0948)  rosuvastatin (CRESTOR) tablet 5 mg (5 mg Oral Not Given 10/11/21 0948)  sulfaSALAzine (AZULFIDINE) tablet 1,000 mg (has no administration in time range)  gabapentin (NEURONTIN) capsule 200 mg (has no administration in time range)  diclofenac Sodium (VOLTAREN) 1 % topical gel 2 g (has no administration in time range)  potassium chloride SA (KLOR-CON M) CR tablet 20 mEq (has no administration in time range)  potassium chloride SA (KLOR-CON M) CR tablet 60 mEq (60 mEq Oral Given 10/11/21 0947)  gabapentin (NEURONTIN) capsule 300 mg (300 mg Oral Given 10/11/21 0948)   sodium chloride 0.9 % bolus 1,000 mL ( Intravenous Stopped 10/10/21 1720)  potassium chloride 10 mEq in 100 mL IVPB (0 mEq Intravenous Stopped 10/10/21 2130)  potassium chloride SA (KLOR-CON M) CR tablet 40 mEq (40 mEq Oral Given 10/10/21 1808)  iohexol (OMNIPAQUE) 350 MG/ML injection 100 mL (100 mLs Intravenous Contrast Given 10/10/21 1829)  cefTRIAXone (ROCEPHIN) 2 g in sodium chloride 0.9 % 100 mL IVPB (0 g Intravenous Stopped 10/10/21 2102)  potassium chloride 10 mEq in 100 mL IVPB (10 mEq Intravenous New Bag/Given 10/11/21 0751)  ondansetron (ZOFRAN) injection 4 mg (4 mg Intravenous Given 10/10/21 2202)  magnesium sulfate IVPB 1 g 100 mL (1 g Intravenous New Bag/Given 10/11/21 0025)    ED Course/ Medical Decision Making/ A&P Clinical Course as of 10/11/21 0950  Wed Oct 10, 2021  1521 EKG per my interpretation appears to show sinus rhythm without acute ischemic changes from her prior tracing. [MT]  7939 Chest x-ray per my interpretation shows possible viral type opacity, consistent possibly with COVID, nodular opacity in the left upper lung, small pleural effusion [MT]  1730 Potassium(!!): 2.5 [MT]  1941 IMPRESSION: 1. Marked severity bilateral multifocal infiltrates. 2. Very small bilateral pleural effusions. 3. Nondisplaced fracture deformity of the right transverse process at the level of T1. [MT]  1942 Patient will be admitted for hypokalemia and hypoxia, likely COVID-related pneumonia.  It is not clear at this time whether the bilateral infiltrates noted on CT are viral or bacterial, but I do think is reasonable to treat with antibiotics given the duration of her symptoms.  She is now on day 12 of symptoms.  Doubt bacteremia or sepsis.  Patient and her husband at bedside were updated regarding need for admission and in agreement [MT]  2019 Admitted to hospitalist [MT]    Clinical Course User Index [MT] Skylier Kretschmer, Carola Rhine, MD                           Medical Decision  Making Amount  and/or Complexity of Data Reviewed Labs: ordered. Decision-making details documented in ED Course. Radiology: ordered.  Risk Prescription drug management. Decision regarding hospitalization.   Patient is here with suspected COVID viral syndrome, now on day 12 of symptoms.  She is not likely to be contagious at this point.  She is however newly hypoxic requiring 2 L nasal cannula to maintain oxygen saturations above 90%.  Work-up initiated including checking electrolyte levels, chest x-ray, EKG IV fluids ordered for hydration. Supplemental history of with the patient's husband at the bedside.  *  Labs and imaging ordered and personally interpreted - Covid PCR positive; Trops flat & low, BNP 428, Mg wnl, K low at 2.5 (likely 2/2 poor oral intake and diarrhea), CBC wnl. Dg chest and CT PE without acute PE, bilateral infiltrates which may be consistent with viral pattern, but I have initiated empiric anitbiotics rocephin + doxy for CAP/superimposed infection coverage.  IV solumedrol requested by hospitalist and ordered at the time of admission.  Pt outside window for remdesivir.  Pt has completed course of paxlovid, but likely initiated too late for meaningful impact (>5 days onset).  ECG per my interpretation shows likely sinus arrhythmia/sinus rhythm, does not appear consistent with A Fib at this time.  No acute ischemic findings.  Oxygen 2-3L [place] new requirement in ED.  Breathing stable on reassessment        Final Clinical Impression(s) / ED Diagnoses Final diagnoses:  Hypokalemia  Hypoxia    Rx / DC Orders ED Discharge Orders     None         Khalidah Herbold, Carola Rhine, MD 10/11/21 [phone]

## 2021-10-10 NOTE — Progress Notes (Signed)
Plan of Care Note for accepted transfer   Patient: [name] MRN: [id number]   [place]: 10/10/2021  Facility requesting transfer: [facility] Requesting Provider: Dr. Langston Masker Reason for transfer: COVID 19 with hypoxic respiratory failure Facility course:   71 year old female with past medical history of gastroesophageal reflux disease, rheumatoid arthritis, hypertension presenting with 10-day history of generalized malaise, weakness, poor oral intake nausea and vomiting as well as dyspnea with exertion.  Of note, home COVID-19 testing was +10 days ago with ongoing symptoms since that diagnosis.  Patient found to be hypoxic and has been placed on 2 L of oxygen via nasal cannula.  Significant bilateral infiltrates noted on CT imaging.  Patient has been given intravenous doxycycline, ceftriaxone as well as Solu-Medrol.  Remdesivir was not given due to patient having symptoms for 10 days at this point.  Patient found to have severe hypokalemia and has received 60 mill equivalents of potassium.  Bed request placed for inpatient telemetry bed at [facility] long with COVID isolation.    Plan of care: The patient is accepted for admission to Telemetry unit, at [facility]..    Author: Vernelle Emerald, MD 10/10/2021  Check [email or web address] for on-call coverage.  Nursing staff, Please call [place] number on Amion as soon as patient's arrival, so appropriate admitting provider can evaluate the pt.

## 2021-10-10 NOTE — ED Notes (Signed)
Patient transported to CT 

## 2021-10-10 NOTE — ED Triage Notes (Signed)
Covid + 10 days ago Reports symptoms worsening Body aches, diarrhea, dry heaves Loss of appetite, tolerating fluids well  States SOB with exertion SPO2 88% on RA at time of triage 2L o2 via [place] applied

## 2021-10-11 DIAGNOSIS — R911 Solitary pulmonary nodule: Secondary | ICD-10-CM | POA: Diagnosis present

## 2021-10-11 LAB — BASIC METABOLIC PANEL
Anion gap: 8 (ref 5–15)
BUN: 10 mg/dL (ref 8–23)
CO2: 31 mmol/L (ref 22–32)
Calcium: 7.8 mg/dL — ABNORMAL LOW (ref 8.9–10.3)
Chloride: 91 mmol/L — ABNORMAL LOW (ref 98–111)
Creatinine, Ser: 0.89 mg/dL (ref 0.44–1.00)
GFR, Estimated: >60 mL/min (ref >60)
Glucose, Bld: 140 mg/dL — ABNORMAL HIGH (ref 70–99)
Potassium: 2.8 mmol/L — ABNORMAL LOW (ref 3.5–5.1)
Sodium: 130 mmol/L — ABNORMAL LOW (ref 135–145)

## 2021-10-11 LAB — CBC
HCT: 33.5 % — ABNORMAL LOW (ref 36.0–46.0)
Hemoglobin: 11.4 g/dL — ABNORMAL LOW (ref 12.0–15.0)
MCH: 28.4 pg (ref 26.0–34.0)
MCHC: 34 g/dL (ref 30.0–36.0)
MCV: 83.5 fL (ref 80.0–100.0)
Platelets: 269 10*3/uL (ref 150–400)
RBC: 4.01 MIL/uL (ref 3.87–5.11)
RDW: 12.6 % (ref 11.5–15.5)
WBC: 5.5 10*3/uL (ref 4.0–10.5)
nRBC: 0 % (ref 0.0–0.2)

## 2021-10-11 LAB — D-DIMER, QUANTITATIVE: D-Dimer, Quant: 1.22 ug/mL-FEU — ABNORMAL HIGH (ref 0.00–0.50)

## 2021-10-11 LAB — C-REACTIVE PROTEIN: CRP: 17.7 mg/dL — ABNORMAL HIGH (ref <1.0)

## 2021-10-11 LAB — PHOSPHORUS: Phosphorus: 2.3 mg/dL — ABNORMAL LOW (ref 2.5–4.6)

## 2021-10-11 LAB — PROCALCITONIN: Procalcitonin: <0.1 ng/mL

## 2021-10-11 LAB — MAGNESIUM: Magnesium: 1.9 mg/dL (ref 1.7–2.4)

## 2021-10-11 MED ORDER — ASCORBIC ACID 500 MG PO TABS
1000.0000 mg | ORAL_TABLET | Freq: Every day | ORAL | Status: DC
  Administered 2021-10-11 – 2021-10-15 (×5): 1000 mg via ORAL
  Filled 2021-10-11 (×5): qty 2

## 2021-10-11 MED ORDER — POTASSIUM CHLORIDE CRYS ER 20 MEQ PO TBCR
20.0000 meq | EXTENDED_RELEASE_TABLET | Freq: Every day | ORAL | Status: DC
  Administered 2021-10-12 – 2021-10-15 (×4): 20 meq via ORAL
  Filled 2021-10-11 (×4): qty 1

## 2021-10-11 MED ORDER — GABAPENTIN 100 MG PO CAPS
200.0000 mg | ORAL_CAPSULE | Freq: Every day | ORAL | Status: DC
  Administered 2021-10-11 – 2021-10-14 (×4): 200 mg via ORAL
  Filled 2021-10-11 (×4): qty 2

## 2021-10-11 MED ORDER — POTASSIUM CHLORIDE CRYS ER 20 MEQ PO TBCR
60.0000 meq | EXTENDED_RELEASE_TABLET | ORAL | Status: AC
  Administered 2021-10-11 (×2): 60 meq via ORAL
  Filled 2021-10-11 (×2): qty 3

## 2021-10-11 MED ORDER — LOSARTAN POTASSIUM 50 MG PO TABS
100.0000 mg | ORAL_TABLET | Freq: Every day | ORAL | Status: DC
  Administered 2021-10-11 – 2021-10-15 (×5): 100 mg via ORAL
  Filled 2021-10-11 (×5): qty 2

## 2021-10-11 MED ORDER — GABAPENTIN 300 MG PO CAPS
300.0000 mg | ORAL_CAPSULE | Freq: Every day | ORAL | Status: DC
  Administered 2021-10-11 – 2021-10-15 (×5): 300 mg via ORAL
  Filled 2021-10-11 (×5): qty 1

## 2021-10-11 MED ORDER — ASPIRIN 81 MG PO CHEW
81.0000 mg | CHEWABLE_TABLET | Freq: Every day | ORAL | Status: DC
  Administered 2021-10-11 – 2021-10-14 (×4): 81 mg via ORAL
  Filled 2021-10-11 (×4): qty 1

## 2021-10-11 MED ORDER — ZINC SULFATE 220 (50 ZN) MG PO CAPS
220.0000 mg | ORAL_CAPSULE | Freq: Every day | ORAL | Status: DC
  Administered 2021-10-11 – 2021-10-15 (×5): 220 mg via ORAL
  Filled 2021-10-11 (×5): qty 1

## 2021-10-11 MED ORDER — SULFASALAZINE 500 MG PO TABS
1000.0000 mg | ORAL_TABLET | Freq: Two times a day (BID) | ORAL | Status: DC
  Administered 2021-10-11 – 2021-10-15 (×9): 1000 mg via ORAL
  Filled 2021-10-11 (×10): qty 2

## 2021-10-11 MED ORDER — ROSUVASTATIN CALCIUM 5 MG PO TABS
5.0000 mg | ORAL_TABLET | Freq: Every day | ORAL | Status: DC
  Administered 2021-10-12 – 2021-10-15 (×3): 5 mg via ORAL
  Filled 2021-10-11 (×5): qty 1

## 2021-10-11 MED ORDER — DOXAZOSIN MESYLATE 2 MG PO TABS
1.0000 mg | ORAL_TABLET | Freq: Every day | ORAL | Status: DC
Start: 2021-10-11 — End: 2021-10-16
  Administered 2021-10-11 – 2021-10-15 (×5): 1 mg via ORAL
  Filled 2021-10-11 (×5): qty 1

## 2021-10-11 MED ORDER — METOPROLOL SUCCINATE ER 50 MG PO TB24
50.0000 mg | ORAL_TABLET | Freq: Two times a day (BID) | ORAL | Status: DC
  Administered 2021-10-11 – 2021-10-15 (×9): 50 mg via ORAL
  Filled 2021-10-11 (×9): qty 1

## 2021-10-11 MED ORDER — DICLOFENAC SODIUM 1 % EX GEL: 2.0000 g | Freq: Four times a day (QID) | CUTANEOUS | Status: DC | PRN

## 2021-10-11 NOTE — TOC Initial Note (Signed)
Transition of Care [facility]) - Initial/Assessment Note    Patient Details  Name: [name] MRN: [id number] Date of Birth: [birth year]  Transition of Care [facility]) CM/SW Contact:    Leeroy Cha, RN Phone Number: 10/11/2021, 8:14 AM  Clinical Narrative:                  Transition of Care [facility]) Screening Note   Patient Details  Name: [name] Date of Birth: 01/02/1951   Transition of Care [facility]) CM/SW Contact:    Leeroy Cha, RN Phone Number: 10/11/2021, 8:14 AM    Transition of Care Department [facility]) has reviewed patient and no TOC needs have been identified at this time. We will continue to monitor patient advancement through interdisciplinary progression rounds. If new patient transition needs arise, please place a TOC consult.    Expected Discharge Plan: Home/Self Care Barriers to Discharge: Continued Medical Work up   Patient Goals and CMS Choice Patient states their goals for this hospitalization and ongoing recovery are:: to go home CMS Medicare.gov Compare Post Acute Care list provided to:: Patient    Expected Discharge Plan and Services Expected Discharge Plan: Home/Self Care   Discharge Planning Services: CM Consult   Living arrangements for the past 2 months: Single Family Home                                      Prior Living Arrangements/Services Living arrangements for the past 2 months: Single Family Home Lives with:: Spouse Patient language and need for interpreter reviewed:: Yes Do you feel safe going back to the place where you live?: Yes            Criminal Activity/Legal Involvement Pertinent to Current Situation/Hospitalization: No - Comment as needed  Activities of Daily Living Home Assistive Devices/Equipment: Dentures (specify type) ADL Screening (condition at time of admission) Patient's cognitive ability adequate to safely complete daily activities?: Yes Is the patient deaf or have difficulty hearing?:  No Does the patient have difficulty seeing, even when wearing glasses/contacts?: No Does the patient have difficulty concentrating, remembering, or making decisions?: No Patient able to express need for assistance with ADLs?: Yes Does the patient have difficulty dressing or bathing?: No Independently performs ADLs?: Yes (appropriate for developmental age) Does the patient have difficulty walking or climbing stairs?: No Weakness of Legs: Both Weakness of Arms/Hands: None  Permission Sought/Granted                  Emotional Assessment Appearance:: Appears stated age     Orientation: : Oriented to Self, Oriented to Place, Oriented to  Time, Oriented to Situation Alcohol / Substance Use: Tobacco Use Psych Involvement: No (comment)  Admission diagnosis:  Hypokalemia [E87.6] Hypoxia [R09.02] COVID-19 virus infection [U07.1] Patient Active Problem List   Diagnosis Date Noted   Lung nodule 10/11/2021   COVID-19 virus infection 10/10/2021   Hypokalemia 10/10/2021   Hyponatremia 10/10/2021   Acute respiratory failure with hypoxia ([place]) 10/10/2021   Prolonged QT interval 10/10/2021   Multiple thyroid nodules 42/35/3614   Helicobacter pylori gastritis 10/05/2018   Morbid obesity ([place]) 05/12/2018   Hyperglycemia 05/12/2018   Lower extremity edema -chronic 05/12/2018   Iron deficiency anemia 05/12/2018   Erythromelalgia ([place]) 10/17/2017   Hyperparathyroidism ([place]) 05/06/2017   Lumbar stenosis with neurogenic claudication 10/11/2016   H/O breast augmentation 09/12/2016   L-S radiculopathy 05/30/2016  Neck mass 05/30/2015   Hx of colonic polyp - ssp 11/03/2014   Neuropathy 07/06/2014   Hyperlipidemia 06/02/2014   Allergic rhinitis    Osteoporosis 10/17/2011   Rheumatoid arthritis ([place])    Hypertension    PCP:  Caren Macadam, MD Pharmacy:   [place] [address] Phone: [phone] Fax: [phone]     Social  Determinants of Health (SDOH) Interventions    Readmission Risk Interventions No flowsheet data found.

## 2021-10-11 NOTE — Progress Notes (Signed)
Progress Note   Patient: [name] EUM:[id number] DOB: 10-May-1951 DOA: 10/10/2021     1 DOS: the patient was seen and examined on 10/11/2021   Brief hospital course: 71 y.o. female with medical history significant for rheumatoid arthritis, hypertension, and BMI 35, now presenting to the emergency department with fatigue, aches, nausea, loose stool, and loss of appetite.  The patient reports that she developed severe aches and fatigue on 09/30/2020, had positive home COVID test, was evaluated by her PCP the following day and started on molnupiravir.  She has continued to experience severe fatigue, aches, loss of appetite, nausea, dry heaves, and loose stool since then.  She denies any fevers, chills, or chest pain.  She denies abdominal pain.  She reports that her chronic bilateral lower extremity swelling seems to be at baseline.  In the ED, patient is found to be afebrile, saturating upper 80s on room air, found modestly hypertensive.  EKG features sinus rhythm with QTc interval 528 ms.  Chest x-ray notable for reticulnodular opacities bilaterally, left upper lobe nodule, and tiny right pleural effusion.  CBC features a sodium 132, potassium 2.5, and bicarbonate 35.  CBC with mild normocytic anemia.  BNP elevated 428.  High-sensitivity troponin slightly elevated.  COVID-19 PCR positive.  The patient was treated with Rocephin, doxycycline, IV Solu-Medrol, IV and oral potassium, and a liter of saline before she was transferred to [facility] for admission.  Assessment and Plan: No notes have been filed under this hospital service. Service: Hospitalist  1. COVID-19; acute hypoxic respiratory failure  - Presents with fatigue, loss of appetite, nausea, and loose stools in setting of COVID infection and found to have new 3 Lpm supplemental O2 requirement  - She completed molnupiravir as outpatient and was started on steroids and antibiotics in ED  - Hypoxia and CXR findings most likely  secondary to COVID PNA and less likely edema - Continue systemic steroid, continue supplemental O2 as needed, wean o2 as tolerated -procal is <0.1 -Will order vit c and zinc   2. Hypokalemia  - remains low this AM -Likely secondary to poor po intake over past 3 weeks, per pt report  - recheck bmet in AM   3. Hypertension  - Continue losartan and metoprolol  -BP stable and controlled at this time   4. RA  - Plaquenil held initially in light of prolonged QT    5. Prolonged QT interval  - QTc is 528 ms in ED  - Correct hypokalemia, continue cardiac monitoring, avoid QT-prolonging medications     6. Lung nodule  - Noted on CXR in ED  - Outpatient follow-up recommended        Subjective: Reports feeling better  Physical Exam: Vitals:   10/10/21 2241 10/11/21 0203 10/11/21 0554 10/11/21 0948  BP: (!) 144/100 (!) 142/82 133/80 135/82  Pulse: 89 (!) 106 (!) 104 98  Resp: 18 20    Temp: 98.7 F (37.1 C) 97.7 F (36.5 C) 98.7 F (37.1 C)   TempSrc: Oral Oral Oral   SpO2: 93% 90% 94%   Weight: 117 kg     Height: 5\' 5"  (1.651 m)      General exam: Awake, laying in bed, in nad Respiratory system: Normal respiratory effort, no audible wheezing Cardiovascular system: regular rate, s1, s2 Gastrointestinal system: Soft, nondistended, positive BS Central nervous system: CN2-12 grossly intact, strength intact Extremities: Perfused, no clubbing Skin: Normal skin turgor, no notable skin lesions seen Psychiatry: Mood normal //  no visual hallucinations   Data Reviewed:  Labs reviewed, procal <0.1  Family Communication: Pt in room, family not at bedside  Disposition: Status is: Inpatient Remains inpatient appropriate because: Severity of illness     Planned Discharge Destination: Home     Author: Marylu Lund, MD 10/11/2021 4:36 PM  For on call review [email or web address].

## 2021-10-12 DIAGNOSIS — E871 Hypo-osmolality and hyponatremia: Secondary | ICD-10-CM

## 2021-10-12 LAB — CBC
HCT: 32.6 % — ABNORMAL LOW (ref 36.0–46.0)
Hemoglobin: 10.7 g/dL — ABNORMAL LOW (ref 12.0–15.0)
MCH: 28.2 pg (ref 26.0–34.0)
MCHC: 32.8 g/dL (ref 30.0–36.0)
MCV: 85.8 fL (ref 80.0–100.0)
Platelets: 294 10*3/uL (ref 150–400)
RBC: 3.8 MIL/uL — ABNORMAL LOW (ref 3.87–5.11)
RDW: 13.1 % (ref 11.5–15.5)
WBC: 5.9 10*3/uL (ref 4.0–10.5)
nRBC: 0 % (ref 0.0–0.2)

## 2021-10-12 LAB — BASIC METABOLIC PANEL
Anion gap: 6 (ref 5–15)
BUN: 14 mg/dL (ref 8–23)
CO2: 31 mmol/L (ref 22–32)
Calcium: 8.5 mg/dL — ABNORMAL LOW (ref 8.9–10.3)
Chloride: 96 mmol/L — ABNORMAL LOW (ref 98–111)
Creatinine, Ser: 0.9 mg/dL (ref 0.44–1.00)
GFR, Estimated: >60 mL/min (ref >60)
Glucose, Bld: 158 mg/dL — ABNORMAL HIGH (ref 70–99)
Potassium: 4.5 mmol/L (ref 3.5–5.1)
Sodium: 133 mmol/L — ABNORMAL LOW (ref 135–145)

## 2021-10-12 LAB — D-DIMER, QUANTITATIVE: D-Dimer, Quant: 0.91 ug/mL-FEU — ABNORMAL HIGH (ref 0.00–0.50)

## 2021-10-12 LAB — MAGNESIUM: Magnesium: 2.4 mg/dL (ref 1.7–2.4)

## 2021-10-12 LAB — C-REACTIVE PROTEIN: CRP: 8.7 mg/dL — ABNORMAL HIGH (ref <1.0)

## 2021-10-12 NOTE — Progress Notes (Signed)
SATURATION QUALIFICATIONS: (This note is used to comply with regulatory documentation for home oxygen)  Patient Saturations on Room Air at Rest = 90%  Patient Saturations on Room Air while Ambulating = 84%  Patient Saturations on 3 Liters of oxygen while Ambulating = 94%  Please briefly explain why patient needs home oxygen: patient experiences dyspnea while ambulating short distances in the room.

## 2021-10-12 NOTE — Progress Notes (Addendum)
Progress Note   Patient: [name] [id number]:[id number] DOB: 1951/04/14 DOA: 10/10/2021     2 DOS: the patient was seen and examined on 10/12/2021   Brief hospital course: 71 y.o. female with medical history significant for rheumatoid arthritis, hypertension, and BMI 38, now presenting to the emergency department with fatigue, aches, nausea, loose stool, and loss of appetite.  The patient reports that she developed severe aches and fatigue on 09/30/2020, had positive home COVID test, was evaluated by her PCP the following day and started on molnupiravir.  She has continued to experience severe fatigue, aches, loss of appetite, nausea, dry heaves, and loose stool since then.  She denies any fevers, chills, or chest pain.  She denies abdominal pain.  She reports that her chronic bilateral lower extremity swelling seems to be at baseline.  In the ED, patient is found to be afebrile, saturating upper 80s on room air, found modestly hypertensive.  EKG features sinus rhythm with QTc interval 528 ms.  Chest x-ray notable for reticulnodular opacities bilaterally, left upper lobe nodule, and tiny right pleural effusion.  CBC features a sodium 132, potassium 2.5, and bicarbonate 35.  CBC with mild normocytic anemia.  BNP elevated 428.  High-sensitivity troponin slightly elevated.  COVID-19 PCR positive.  The patient was treated with Rocephin, doxycycline, IV Solu-Medrol, IV and oral potassium, and a liter of saline before she was transferred to [facility] for admission.  Assessment and Plan: No notes have been filed under this hospital service. Service: Hospitalist  1. COVID-19; acute hypoxic respiratory failure  - Presents with fatigue, loss of appetite, nausea, and loose stools in setting of COVID infection and found to have new 3 Lpm supplemental O2 requirement  - She completed molnupiravir as outpatient and was started on steroids and antibiotics in ED  - Hypoxia and CXR findings most likely  secondary to COVID PNA and less likely edema - Continue systemic steroid, continue supplemental O2 as needed, wean o2 as tolerated -procal is <0.1 -Cont on vit c and zinc -Still hypoxemic requiring 3L O2. Consider f/u CXR in AM for interval change   2. Hypokalemia  -Likely secondary to poor po intake over past 3 weeks, per pt report  -Corrected - recheck bmet in AM   3. Hypertension  - Continue losartan and metoprolol  -BP stable and controlled at this time   4. RA  - Plaquenil held initially in light of prolonged QT    5. Prolonged QT interval  - QTc is 528 ms in ED  - Correct hypokalemia, continue cardiac monitoring, avoid QT-prolonging medications     6. Lung nodule  - Noted on CXR in ED  - Outpatient follow-up recommended   7. Hyponatremia -improved with IVF      Subjective: This AM, reported feeling "100% better." However on ambulation trial in afternoon, did report increased sob  Physical Exam: Vitals:   10/11/21 2047 10/12/21 0542 10/12/21 1000 10/12/21 1236  BP: 139/82 (!) 146/89 134/77 (!) 150/89  Pulse: 87 93 73 96  Resp: 19 20  19   Temp: 98.7 F (37.1 C) 98.7 F (37.1 C)  97.9 F (36.6 C)  TempSrc: Oral Oral  Oral  SpO2: 96% 96%  92%  Weight:      Height:       General exam: Conversant, in no acute distress Respiratory system: normal chest rise, clear, no audible wheezing Cardiovascular system: regular rhythm, perfused Gastrointestinal system: Nondistended, nontender Central nervous system: No seizures, no tremors  Extremities: No cyanosis, no joint deformities Skin: No rashes, no pallor Psychiatry: Affect normal // no auditory hallucinations   Data Reviewed:  Labs reviewed, procal <0.1  Family Communication: Pt in room, family currently at bedside  Disposition: Status is: Inpatient Remains inpatient appropriate because: Severity of illness     Planned Discharge Destination: Home    Author: Marylu Lund, MD 10/12/2021 4:52 PM  For  on call review [email or web address].

## 2021-10-12 NOTE — Progress Notes (Signed)
°  Transition of Care [facility]) Screening Note   Patient Details  Name: [name] Date of Birth: Jun 06, 1951   Transition of Care [facility]) CM/SW Contact:    Dessa Phi, RN Phone Number: 10/12/2021, 2:07 PM    Transition of Care Department [facility]) has reviewed patient and no TOC needs have been identified at this time. We will continue to monitor patient advancement through interdisciplinary progression rounds. If new patient transition needs arise, please place a TOC consult.

## 2021-10-13 DIAGNOSIS — I150 Renovascular hypertension: Secondary | ICD-10-CM

## 2021-10-13 LAB — CBC
HCT: 33.5 % — ABNORMAL LOW (ref 36.0–46.0)
Hemoglobin: 11 g/dL — ABNORMAL LOW (ref 12.0–15.0)
MCH: 28.6 pg (ref 26.0–34.0)
MCHC: 32.8 g/dL (ref 30.0–36.0)
MCV: 87.2 fL (ref 80.0–100.0)
Platelets: 322 K/uL (ref 150–400)
RBC: 3.84 MIL/uL — ABNORMAL LOW (ref 3.87–5.11)
RDW: 12.9 % (ref 11.5–15.5)
WBC: 7.4 K/uL (ref 4.0–10.5)
nRBC: 0 % (ref 0.0–0.2)

## 2021-10-13 LAB — BASIC METABOLIC PANEL WITH GFR
Anion gap: 7 (ref 5–15)
BUN: 19 mg/dL (ref 8–23)
CO2: 29 mmol/L (ref 22–32)
Calcium: 8.6 mg/dL — ABNORMAL LOW (ref 8.9–10.3)
Chloride: 95 mmol/L — ABNORMAL LOW (ref 98–111)
Creatinine, Ser: 0.95 mg/dL (ref 0.44–1.00)
GFR, Estimated: >60 mL/min
Glucose, Bld: 180 mg/dL — ABNORMAL HIGH (ref 70–99)
Potassium: 4.2 mmol/L (ref 3.5–5.1)
Sodium: 131 mmol/L — ABNORMAL LOW (ref 135–145)

## 2021-10-13 LAB — C-REACTIVE PROTEIN: CRP: 4.6 mg/dL — ABNORMAL HIGH

## 2021-10-13 LAB — MAGNESIUM: Magnesium: 2.1 mg/dL (ref 1.7–2.4)

## 2021-10-13 LAB — D-DIMER, QUANTITATIVE: D-Dimer, Quant: 0.97 ug/mL-FEU — ABNORMAL HIGH (ref 0.00–0.50)

## 2021-10-13 MED ORDER — FUROSEMIDE 10 MG/ML IJ SOLN
40.0000 mg | INTRAMUSCULAR | Status: AC
  Administered 2021-10-13: 40 mg via INTRAVENOUS
  Filled 2021-10-13: qty 4

## 2021-10-14 ENCOUNTER — Inpatient Hospital Stay (HOSPITAL_COMMUNITY): Payer: Medicare Other

## 2021-10-14 DIAGNOSIS — I5031 Acute diastolic (congestive) heart failure: Secondary | ICD-10-CM

## 2021-10-14 LAB — COMPREHENSIVE METABOLIC PANEL
ALT: 23 U/L (ref 0–44)
AST: 19 U/L (ref 15–41)
Albumin: 2.8 g/dL — ABNORMAL LOW (ref 3.5–5.0)
Alkaline Phosphatase: 43 U/L (ref 38–126)
Anion gap: 7 (ref 5–15)
BUN: 20 mg/dL (ref 8–23)
CO2: 29 mmol/L (ref 22–32)
Calcium: 9.1 mg/dL (ref 8.9–10.3)
Chloride: 100 mmol/L (ref 98–111)
Creatinine, Ser: 0.79 mg/dL (ref 0.44–1.00)
GFR, Estimated: >60 mL/min (ref >60)
Glucose, Bld: 146 mg/dL — ABNORMAL HIGH (ref 70–99)
Potassium: 4.4 mmol/L (ref 3.5–5.1)
Sodium: 136 mmol/L (ref 135–145)
Total Bilirubin: 0.2 mg/dL — ABNORMAL LOW (ref 0.3–1.2)
Total Protein: 6 g/dL — ABNORMAL LOW (ref 6.5–8.1)

## 2021-10-14 LAB — CBC
HCT: 35.1 % — ABNORMAL LOW (ref 36.0–46.0)
Hemoglobin: 11.8 g/dL — ABNORMAL LOW (ref 12.0–15.0)
MCH: 28.4 pg (ref 26.0–34.0)
MCHC: 33.6 g/dL (ref 30.0–36.0)
MCV: 84.4 fL (ref 80.0–100.0)
Platelets: 396 10*3/uL (ref 150–400)
RBC: 4.16 MIL/uL (ref 3.87–5.11)
RDW: 12.8 % (ref 11.5–15.5)
WBC: 9.4 10*3/uL (ref 4.0–10.5)
nRBC: 0 % (ref 0.0–0.2)

## 2021-10-14 LAB — ECHOCARDIOGRAM COMPLETE
Area-P 1/2: 4.35 cm2
Calc EF: 50.3 %
Height: 65 in
S' Lateral: 3.9 cm
Single Plane A2C EF: 51.3 %
Single Plane A4C EF: 50 %
Weight: 4127.01 oz

## 2021-10-14 MED ORDER — FUROSEMIDE 10 MG/ML IJ SOLN
40.0000 mg | Freq: Once | INTRAMUSCULAR | Status: AC
  Administered 2021-10-14: 40 mg via INTRAVENOUS
  Filled 2021-10-14: qty 4

## 2021-10-14 MED ORDER — FUROSEMIDE 10 MG/ML IJ SOLN
40.0000 mg | Freq: Two times a day (BID) | INTRAMUSCULAR | Status: DC
  Administered 2021-10-14 – 2021-10-15 (×2): 40 mg via INTRAVENOUS
  Filled 2021-10-14 (×2): qty 4

## 2021-10-14 MED ORDER — PERFLUTREN LIPID MICROSPHERE
1.0000 mL | INTRAVENOUS | Status: AC | PRN
  Administered 2021-10-14: 2 mL via INTRAVENOUS
  Filled 2021-10-14: qty 10

## 2021-10-14 NOTE — Progress Notes (Addendum)
Progress Note   Patient: [name] ZJI:[id number] DOB: 11-Apr-1951 DOA: 10/10/2021     4 DOS: the patient was seen and examined on 10/14/2021   Brief hospital course: 71 y.o. female with medical history significant for rheumatoid arthritis, hypertension, and BMI 108, now presenting to the emergency department with fatigue, aches, nausea, loose stool, and loss of appetite.  The patient reports that she developed severe aches and fatigue on 09/30/2020, had positive home COVID test, was evaluated by her PCP the following day and started on molnupiravir.  She has continued to experience severe fatigue, aches, loss of appetite, nausea, dry heaves, and loose stool since then.  She denies any fevers, chills, or chest pain.  She denies abdominal pain.  She reports that her chronic bilateral lower extremity swelling seems to be at baseline.  In the ED, patient is found to be afebrile, saturating upper 80s on room air, found modestly hypertensive.  EKG features sinus rhythm with QTc interval 528 ms.  Chest x-ray notable for reticulnodular opacities bilaterally, left upper lobe nodule, and tiny right pleural effusion.  CBC features a sodium 132, potassium 2.5, and bicarbonate 35.  CBC with mild normocytic anemia.  BNP elevated 428.  High-sensitivity troponin slightly elevated.  COVID-19 PCR positive.  The patient was treated with Rocephin, doxycycline, IV Solu-Medrol, IV and oral potassium, and a liter of saline before she was transferred to [facility] for admission.  Assessment and Plan: No notes have been filed under this hospital service. Service: Hospitalist  1. COVID-19; acute hypoxic respiratory failure  - Presents with fatigue, loss of appetite, nausea, and loose stools in setting of COVID infection and found to have new 3 Lpm supplemental O2 requirement  - She completed molnupiravir as outpatient and was started on steroids and antibiotics in ED  - Hypoxia and CXR findings most likely  secondary to COVID PNA and less likely edema - Continue systemic steroid, continue supplemental O2 as needed, wean o2 as tolerated -procal is <0.1 -Cont on vit c and zinc -O2 weaned to [facility]. Attempted to ambulate on RA, however pt desaturated to around 80% with increased dypsnea and marked weakness. Pt's family reports pt has to walk up flight of stairs at home -Will consult PT/OT -Given increased dyspnea and continued hypoxia, have ordered f/u CXR for interval change   2. Hypokalemia  -Likely secondary to poor po intake over past 3 weeks, per pt report  -Corrected - repeat bmet in AM   3. Hypertension  - Continue losartan and metoprolol  -BP stable and controlled at this time   4. RA  - Plaquenil held initially in light of prolonged QT    5. Prolonged QT interval  - QTc is 528 ms in ED  - Correct hypokalemia, continue cardiac monitoring, avoid QT-prolonging medications     6. Lung nodule  - Noted on CXR in ED  - Outpatient follow-up recommended   7. Hyponatremia -resolved      Subjective: This AM, reported feeling "100% better." However on ambulation trial in afternoon, did report increased sob  Physical Exam: Vitals:   10/13/21 1953 10/14/21 0540 10/14/21 1137 10/14/21 1327  BP: (!) 163/103 (!) 165/93 (!) 164/102 (!) 162/91  Pulse: 98 91  84  Resp: 18 18  18   Temp: 98.5 F (36.9 C) 97.7 F (36.5 C)  98 F (36.7 C)  TempSrc: Oral Oral  Oral  SpO2: 99% 96%  95%  Weight:      Height:  General exam: Awake, laying in bed, in nad Respiratory system: Normal respiratory effort, no wheezing Cardiovascular system: regular rate, s1, s2 Gastrointestinal system: Soft, nondistended, positive BS Central nervous system: CN2-12 grossly intact, strength intact Extremities: Perfused, no clubbing Skin: Normal skin turgor, no notable skin lesions seen Psychiatry: Mood normal // no visual hallucinations   Data Reviewed:  Labs reviewed, Sodium 136  Family  Communication: Pt in room, family currently at bedside  Disposition: Status is: Inpatient Remains inpatient appropriate because: Severity of illness     Planned Discharge Destination: Home    Author: Marylu Lund, MD 10/14/2021 6:17 PM  For on call review [email or web address].

## 2021-10-14 NOTE — Progress Notes (Addendum)
Progress Note   Patient: [name] [id number]:[id number] DOB: 11-18-1950 DOA: 10/10/2021     4 DOS: the patient was seen and examined on 10/13/2021   Late Entry for 10/13/21  Brief hospital course: 71 y.o. female with medical history significant for rheumatoid arthritis, hypertension, and BMI 43, now presenting to the emergency department with fatigue, aches, nausea, loose stool, and loss of appetite.  The patient reports that she developed severe aches and fatigue on 09/30/2020, had positive home COVID test, was evaluated by her PCP the following day and started on molnupiravir.  She has continued to experience severe fatigue, aches, loss of appetite, nausea, dry heaves, and loose stool since then.  She denies any fevers, chills, or chest pain.  She denies abdominal pain.  She reports that her chronic bilateral lower extremity swelling seems to be at baseline.  In the ED, patient is found to be afebrile, saturating upper 80s on room air, found modestly hypertensive.  EKG features sinus rhythm with QTc interval 528 ms.  Chest x-ray notable for reticulnodular opacities bilaterally, left upper lobe nodule, and tiny right pleural effusion.  CBC features a sodium 132, potassium 2.5, and bicarbonate 35.  CBC with mild normocytic anemia.  BNP elevated 428.  High-sensitivity troponin slightly elevated.  COVID-19 PCR positive.  The patient was treated with Rocephin, doxycycline, IV Solu-Medrol, IV and oral potassium, and a liter of saline before she was transferred to [facility] for admission.  Assessment and Plan: No notes have been filed under this hospital service. Service: Hospitalist  1. COVID-19; acute hypoxic respiratory failure  - Presents with fatigue, loss of appetite, nausea, and loose stools in setting of COVID infection and found to have new 3 Lpm supplemental O2 requirement  - She completed molnupiravir as outpatient and was started on steroids and antibiotics in ED  - Hypoxia and CXR  findings most likely secondary to COVID PNA and less likely edema - Continue systemic steroid, continue supplemental O2 as needed, wean o2 as tolerated -procal is <0.1 -Cont on vit c and zinc -Attempted ambulation however, pt noted to have increased dyspnea. Giving trial of lasix   2. Hypokalemia  -Likely secondary to poor po intake over past 3 weeks, per pt report  -Corrected - recheck bmet in AM   3. Hypertension  - Continue losartan and metoprolol  -BP stable and controlled at this time   4. RA  - Plaquenil held initially in light of prolonged QT    5. Prolonged QT interval  - QTc is 528 ms in ED  - Correct hypokalemia, continue cardiac monitoring, avoid QT-prolonging medications     6. Lung nodule  - Noted on CXR in ED  - Outpatient follow-up recommended   7. Hyponatremia -improved with IVF      Subjective: This AM, reported feeling "100% better." However on ambulation trial in afternoon, did report increased sob  Physical Exam: Vitals:   10/13/21 1953 10/14/21 0540 10/14/21 1137 10/14/21 1327  BP: (!) 163/103 (!) 165/93 (!) 164/102 (!) 162/91  Pulse: 98 91  84  Resp: 18 18  18   Temp: 98.5 F (36.9 C) 97.7 F (36.5 C)  98 F (36.7 C)  TempSrc: Oral Oral  Oral  SpO2: 99% 96%  95%  Weight:      Height:       General exam: Awake, laying in bed, in nad Respiratory system: Increased respiratory effort, no wheezing Cardiovascular system: regular rate, s1, s2 Gastrointestinal system: Soft, nondistended,  positive BS Central nervous system: CN2-12 grossly intact, strength intact Extremities: Perfused, no clubbing Skin: Normal skin turgor, no notable skin lesions seen Psychiatry: Mood normal // no visual hallucinations   Data Reviewed:  Labs reviewed, sodium 131  Family Communication: Pt in room, family currently at bedside  Disposition: Status is: Inpatient Remains inpatient appropriate because: Severity of illness     Planned Discharge Destination:  Home    Author: Marylu Lund, MD 10/14/2021 6:14 PM  For on call review [email or web address].

## 2021-10-15 ENCOUNTER — Other Ambulatory Visit (HOSPITAL_COMMUNITY): Payer: Self-pay

## 2021-10-15 LAB — COMPREHENSIVE METABOLIC PANEL
ALT: 24 U/L (ref 0–44)
AST: 18 U/L (ref 15–41)
Albumin: 2.8 g/dL — ABNORMAL LOW (ref 3.5–5.0)
Alkaline Phosphatase: 49 U/L (ref 38–126)
Anion gap: 9 (ref 5–15)
BUN: 24 mg/dL — ABNORMAL HIGH (ref 8–23)
CO2: 31 mmol/L (ref 22–32)
Calcium: 8.9 mg/dL (ref 8.9–10.3)
Chloride: 93 mmol/L — ABNORMAL LOW (ref 98–111)
Creatinine, Ser: 1.03 mg/dL — ABNORMAL HIGH (ref 0.44–1.00)
GFR, Estimated: 58 mL/min — ABNORMAL LOW (ref >60)
Glucose, Bld: 96 mg/dL (ref 70–99)
Potassium: 3.8 mmol/L (ref 3.5–5.1)
Sodium: 133 mmol/L — ABNORMAL LOW (ref 135–145)
Total Bilirubin: 0.4 mg/dL (ref 0.3–1.2)
Total Protein: 6.1 g/dL — ABNORMAL LOW (ref 6.5–8.1)

## 2021-10-15 LAB — C-REACTIVE PROTEIN: CRP: 1.4 mg/dL — ABNORMAL HIGH (ref <1.0)

## 2021-10-15 LAB — D-DIMER, QUANTITATIVE: D-Dimer, Quant: 1.66 ug/mL-FEU — ABNORMAL HIGH (ref 0.00–0.50)

## 2021-10-15 MED ORDER — DOXYCYCLINE HYCLATE 100 MG PO TABS
100.0000 mg | ORAL_TABLET | Freq: Two times a day (BID) | ORAL | 0 refills | Status: AC
  Filled 2021-10-15: qty 1, 1d supply, fill #0

## 2021-10-15 MED ORDER — APIXABAN 5 MG PO TABS
5.0000 mg | ORAL_TABLET | Freq: Two times a day (BID) | ORAL | Status: DC
  Administered 2021-10-15: 5 mg via ORAL
  Filled 2021-10-15: qty 1

## 2021-10-15 MED ORDER — PREDNISONE 50 MG PO TABS
50.0000 mg | ORAL_TABLET | Freq: Every day | ORAL | 0 refills | Status: AC
  Filled 2021-10-15: qty 5, 5d supply, fill #0

## 2021-10-15 MED ORDER — APIXABAN 5 MG PO TABS
5.0000 mg | ORAL_TABLET | Freq: Two times a day (BID) | ORAL | 0 refills | Status: DC
  Filled 2021-10-15: qty 60, 30d supply, fill #0

## 2021-10-15 MED ORDER — ENOXAPARIN SODIUM 60 MG/0.6ML IJ SOSY: 60.0000 mg | PREFILLED_SYRINGE | INTRAMUSCULAR | Status: DC

## 2021-10-15 NOTE — Evaluation (Signed)
Physical Therapy Evaluation Patient Details Name: [name] MRN: [id number] DOB: [birth year] Today's Date: 10/15/2021  History of Present Illness  71 year old female admitted with fatigue, aches, nausea, loose stool and loss of appetite. Pt is + COVID-19. PMH significant for rheumatoid arthritis, hypertension  Clinical Impression  Pt admitted with above diagnosis.  Pt requires max encouragement to participate, declines amb, declining to attempt her own peri-care initially. Repeated STS for tolerance, reviewed and encouraged use of IS and flutter valve. Pt on 2L O2, removed O2--> initially remained low 90s on RA, decr to 82% on RA with light activity, replaced at 2L and pt recovered to 90s within less than 1 minute. Reviewed proper breathing techniques  Pt currently with functional limitations due to the deficits listed below (see PT Problem List). Pt will benefit from skilled PT to increase their independence and safety with mobility to allow discharge to the venue listed below.          Recommendations for follow up therapy are one component of a multi-disciplinary discharge planning process, led by the attending physician.  Recommendations may be updated based on patient status, additional functional criteria and insurance authorization.  Follow Up Recommendations Home health PT    Assistance Recommended at Discharge Intermittent Supervision/Assistance  Patient can return home with the following  Help with stairs or ramp for entrance;A little help with bathing/dressing/bathroom;Assistance with cooking/housework;Assist for transportation    Equipment Recommendations None recommended by PT  Recommendations for Other Services       Functional Status Assessment Patient has had a recent decline in their functional status and demonstrates the ability to make significant improvements in function in a reasonable and predictable amount of time.     Precautions / Restrictions  Precautions Precautions: Fall Restrictions Weight Bearing Restrictions: No      Mobility  Bed Mobility Overal bed mobility: Needs Assistance Bed Mobility: Supine to Sit, Sit to Supine     Supine to sit: Supervision Sit to supine: Supervision   General bed mobility comments: incr time, effort and cues to self assist    Transfers Overall transfer level: Needs assistance Equipment used: Rolling walker (2 wheels) Transfers: Sit to/from Stand, Bed to chair/wheelchair/BSC Sit to Stand: Supervision   Step pivot transfers: Min guard       General transfer comment: repeated  x5 for endurance and activity tolerance.    Ambulation/Gait               General Gait Details: pt declined  Financial trader Rankin (Stroke Patients Only)       Balance   Sitting-balance support: Feet supported, No upper extremity supported Sitting balance-Leahy Scale: Normal     Standing balance support: Single extremity supported, Bilateral upper extremity supported, During functional activity Standing balance-Leahy Scale: Fair Standing balance comment: NT to mod challenges; fair to good                             Pertinent Vitals/Pain Pain Assessment Pain Assessment: 0-10 Pain Score: 4  Pain Location: generalized Pain Descriptors / Indicators: Aching, Constant Pain Intervention(s): Limited activity within patient's tolerance, Monitored during session, Repositioned    Home Living Family/patient expects to be discharged to:: Private residence Living Arrangements: Spouse/significant other Available Help at Discharge: Family;Available PRN/intermittently (husband works) Type of Home: House Home Access: Level entry  Alternate Level Stairs-Number of Steps: 13 Home Layout: Two level;1/2 bath on main level Home Equipment: Grab bars - toilet;Shower Land (2 wheels);Cane - single point;Transport chair      Prior  Function Prior Level of Function : Independent/Modified Independent             Mobility Comments: I with no AD ADLs Comments: MOD I for most ADL; pt husband assists with LB dressing due to previous back surgeries. Pt cooks, cleans; does not drive often due to neuropathy     Hand Dominance        Extremity/Trunk Assessment   Upper Extremity Assessment Upper Extremity Assessment: Defer to OT evaluation    Lower Extremity Assessment Lower Extremity Assessment: Generalized weakness;RLE deficits/detail;LLE deficits/detail RLE Deficits / Details: AROM grossly WFL,  strength at least 3+/5 RLE Sensation: history of peripheral neuropathy LLE Deficits / Details: same as above LLE Sensation: history of peripheral neuropathy    Cervical / Trunk Assessment Cervical / Trunk Assessment: Normal  Communication   Communication: No difficulties  Cognition Arousal/Alertness: Awake/alert Behavior During Therapy: WFL for tasks assessed/performed Overall Cognitive Status: Within Functional Limits for tasks assessed                                          General Comments General comments (skin integrity, edema, etc.): spo2 down to 88% with standing tasks, recovered >90% within 30 seconds; HR with activity 111, below 100 within 30 seconds of rest    Exercises     Assessment/Plan    PT Assessment Patient needs continued PT services  PT Problem List Decreased strength;Decreased mobility;Decreased activity tolerance;Decreased balance;Decreased knowledge of use of DME;Cardiopulmonary status limiting activity       PT Treatment Interventions DME instruction;Therapeutic exercise;Gait training;Functional mobility training;Therapeutic activities;Patient/family education    PT Goals (Current goals can be found in the Care Plan section)  Acute Rehab PT Goals Patient Stated Goal: home, get bettter PT Goal Formulation: With patient Time For Goal Achievement:  10/29/21 Potential to Achieve Goals: Good    Frequency Min 3X/week     Co-evaluation               AM-PAC PT "6 Clicks" Mobility  Outcome Measure Help needed turning from your back to your side while in a flat bed without using bedrails?: A Little Help needed moving from lying on your back to sitting on the side of a flat bed without using bedrails?: A Little Help needed moving to and from a bed to a chair (including a wheelchair)?: A Little Help needed standing up from a chair using your arms (e.g., wheelchair or bedside chair)?: A Little Help needed to walk in hospital room?: A Little Help needed climbing 3-5 steps with a railing? : A Lot 6 Click Score: 17    End of Session Equipment Utilized During Treatment: Gait belt Activity Tolerance: Patient tolerated treatment well Patient left: with call bell/phone within reach;in bed;with family/visitor present;with bed alarm set Nurse Communication: Mobility status PT Visit Diagnosis: Other abnormalities of gait and mobility (R26.89);Difficulty in walking, not elsewhere classified (R26.2)    Time: 0092-3300 PT Time Calculation (min) (ACUTE ONLY): 26 min   Charges:   PT Evaluation $PT Eval Low Complexity: 1 Low PT Treatments $Therapeutic Activity: 8-22 mins        Baxter Flattery, PT  Acute Rehab Dept ([place]) [phone] Pager [phone]  10/15/2021  [facility] 10/15/2021, 12:47 PM

## 2021-10-15 NOTE — Progress Notes (Signed)
ANTICOAGULATION CONSULT NOTE - Initial Consult  Pharmacy Consult for Eliquis Indication: atrial fibrillation  Allergies  Allergen Reactions   Amlodipine Besylate Other (See Comments)    Tremors   Depakote [Divalproex Sodium] Swelling   Nortriptyline Other (See Comments)    tremors   Dilaudid [Hydromorphone Hcl]     Headache, muscle tightness   Tramadol     Felt "stoned"    Patient Measurements: Height: 5\' 5"  (165.1 cm) Weight: 117 kg (257 lb 15 oz) IBW/kg (Calculated) : 57  Vital Signs: BP: 116/72 (02/27 1404) Pulse Rate: 102 (02/27 1404)  Labs: Recent Labs    10/13/21 0459 10/14/21 0929 10/15/21 0825  HGB 11.0* 11.8*  --   HCT 33.5* 35.1*  --   PLT 322 396  --   CREATININE 0.95 0.79 1.03*    Estimated Creatinine Clearance: 63.1 mL/min (A) (by C-G formula based on SCr of 1.03 mg/dL (H)).   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   Osteopenia 10/17/2011   DEXA 09/2007: -1.4 L fem; 10/2011: -1.2 L fem    Osteoporosis    PONV (postoperative nausea and vomiting)    Pseudogout of foot    Rheumatoid arthritis(714.0) dx 2010   Shingles    hx of    Tibial plateau fracture, right, closed, initial encounter 07/16/2017    Medications:  Scheduled:   vitamin C  1,000 mg Oral Daily   aspirin  81 mg Oral QHS   doxazosin  1 mg Oral Daily   doxycycline  100 mg Oral Q12H   gabapentin  200 mg Oral QHS   gabapentin  300 mg Oral Daily   losartan  100 mg Oral  Daily   metoprolol succinate  50 mg Oral BID   potassium chloride  20 mEq Oral Daily   predniSONE  50 mg Oral Daily   rosuvastatin  5 mg Oral Daily   sodium chloride flush  3 mL Intravenous Q12H   sulfaSALAzine  1,000 mg Oral BID   zinc sulfate  220 mg Oral Daily    Assessment: 71 yo female presented to [facility] ED with severe fatigue, aches, loss of appetite, nausea, dry heaves, and loose stools.  Found to have atrial fibrillation during admit. CHADS-VASc = 3.  No anticoagulation noted PTA. Pharmacy is consulted to dose Eliquis for Afib.    Goal of Therapy:  Monitor platelets by anticoagulation protocol: Yes   Plan:  D/c prophylactic Lovenox and ASA 81mg  per MD Begin Eliquis 5mg  PO BID Needs education and coupon prior to discharge  Dimple Nanas, PharmD 10/15/2021 4:37 PM

## 2021-10-15 NOTE — Discharge Summary (Signed)
Physician Discharge Summary   Patient: [name] MRN: [id number] DOB: 24-Feb-1951  Admit date:     10/10/2021  Discharge date: 10/15/21  Discharge Physician: Marylu Lund   PCP: Caren Macadam, MD   Recommendations at discharge:    Follow up with PCP in 1-2 weeks  Discharge Diagnoses: Principal Problem:   COVID-19 virus infection Active Problems:   Rheumatoid arthritis ([place])   Hypertension   Hypokalemia   Hyponatremia   Acute respiratory failure with hypoxia (HCC)   Prolonged QT interval   Lung nodule  Resolved Problems:   * No resolved hospital problems. *   Hospital Course: 71 y.o. female with medical history significant for rheumatoid arthritis, hypertension, and BMI 43, now presenting to the emergency department with fatigue, aches, nausea, loose stool, and loss of appetite.  The patient reports that she developed severe aches and fatigue on 09/30/2020, had positive home COVID test, was evaluated by her PCP the following day and started on molnupiravir.  She has continued to experience severe fatigue, aches, loss of appetite, nausea, dry heaves, and loose stool since then.  She denies any fevers, chills, or chest pain.  She denies abdominal pain.  She reports that her chronic bilateral lower extremity swelling seems to be at baseline.   In the ED, patient is found to be afebrile, saturating upper 80s on room air, found modestly hypertensive.  EKG features sinus rhythm with QTc interval 528 ms.  Chest x-ray notable for reticulnodular opacities bilaterally, left upper lobe nodule, and tiny right pleural effusion.  CBC features a sodium 132, potassium 2.5, and bicarbonate 35.  CBC with mild normocytic anemia.  BNP elevated 428.  High-sensitivity troponin slightly elevated.  COVID-19 PCR positive.  The patient was treated with Rocephin, doxycycline, IV Solu-Medrol, IV and oral potassium, and a liter of saline before she was transferred to [facility] for  admission.  Assessment and Plan: No notes have been filed under this hospital service. Service: Hospitalist  1. COVID-19; acute hypoxic respiratory failure  - Presents with fatigue, loss of appetite, nausea, and loose stools in setting of COVID infection and found to have new 3 Lpm supplemental O2 requirement  - She completed molnupiravir as outpatient and was started on steroids and antibiotics in ED  - Hypoxia and CXR findings most likely secondary to COVID PNA and less likely edema - Continue systemic steroid, continue supplemental O2 as needed, wean o2 as tolerated -procal is <0.1 -Cont on vit c and zinc -O2 weaned to RA at rest, pt did desaturate to the 80's on ambulation, will require home O2   2. Hypokalemia  -Likely secondary to poor po intake over past 3 weeks, per pt report  -Corrected   3. Hypertension  - Continue losartan and metoprolol    4. RA  - Plaquenil held initially in light of prolonged QT    5. Prolonged QT interval  - QTc is 528 ms in ED  - Correct hypokalemia, continue cardiac monitoring, avoid QT-prolonging medications     6. Lung nodule  - Noted on CXR in ED  - Outpatient follow-up recommended    7. Hyponatremia -resolved  8. Afib, new diagnosis -EKG reviewed, findings of afib noted -2d echo ordered and reviewed, normal LVEF with no wall motion -Most recent TSH reviewed, unremarkable -Pt continued on metoprolol, rate controlled -Will start on eliquis        Consultants:  Procedures performed:  Disposition: Home Diet recommendation:  Regular diet  DISCHARGE  MEDICATION: Allergies as of 10/15/2021       Reactions   Amlodipine Besylate Other (See Comments)   Tremors   Depakote [divalproex Sodium] Swelling   Nortriptyline Other (See Comments)   tremors   Dilaudid [hydromorphone Hcl]    Headache, muscle tightness   Tramadol    Felt "stoned"        Medication List     STOP taking these medications    aspirin 81 MG tablet        TAKE these medications    apixaban 5 MG Tabs tablet Commonly known as: ELIQUIS Take 1 tablet (5 mg total) by mouth 2 (two) times daily. Start taking on: October 16, 2021   diclofenac sodium 1 % Gel Commonly known as: VOLTAREN Apply 2 g topically 4 (four) times daily as needed (pain).   doxycycline 100 MG tablet Commonly known as: VIBRA-TABS Take 1 tablet (100 mg total) by mouth every 12 (twelve) hours for 1 dose.   furosemide 20 MG tablet Commonly known as: LASIX Take 2 - 3 tablets by mouth 2 times daily as needed for edema. (need appt for refills)   gabapentin 100 MG capsule Commonly known as: NEURONTIN TAKE 3 CAPSULES IN THE MORNING AND 2 CAPSULES AT BEDTIME. OK TO TAKE EXTRA CAPSULE AT BEDTIME AS NEEDED What changed:  how much to take how to take this when to take this   hydroxychloroquine 200 MG tablet Commonly known as: PLAQUENIL Take 1 tablet by mouth with food or milk twice a day What changed:  how much to take how to take this when to take this   hydroxypropyl methylcellulose / hypromellose 2.5 % ophthalmic solution Commonly known as: ISOPTO TEARS / GONIOVISC Place 1 drop into both eyes daily.   losartan 100 MG tablet Commonly known as: COZAAR TAKE 1 TABLET BY MOUTH ONCE A DAY   metoprolol succinate 50 MG 24 hr tablet Commonly known as: TOPROL-XL TAKE 1 TABLET BY MOUTH TWICE DAILY What changed: how much to take   [place] Take by mouth daily as needed.   potassium chloride SA 20 MEQ tablet Commonly known as: KLOR-CON M Take 1 tablet (20 mEq total) by mouth 2 (two) times daily.   predniSONE 50 MG tablet Commonly known as: DELTASONE Take 1 tablet (50 mg total) by mouth daily with breakfast for 5 days.   sulfaSALAzine 500 MG tablet Commonly known as: AZULFIDINE Take 2 tablets by mouth twice a day What changed: additional instructions   Vitamin D 125 MCG (5000 UT) Caps Take 5,000 Units by mouth daily.                Durable Medical Equipment  (From admission, onward)           Start     Ordered   10/15/21 1636  For home use only DME oxygen  Once       Question Answer Comment  Length of Need 6 Months   Mode or (Route) Nasal cannula   Liters per Minute 2   Frequency Continuous (stationary and portable oxygen unit needed)   Oxygen delivery system Gas      10/15/21 1635             Discharge Exam: Filed Weights   10/10/21 1442 10/10/21 2241  Weight: 119 kg 117 kg   General exam: Awake, laying in bed, in nad Respiratory system: Normal respiratory effort, no wheezing Cardiovascular system: regular rate, s1, s2 Gastrointestinal system: Soft, nondistended, positive  BS Central nervous system: CN2-12 grossly intact, strength intact Extremities: Perfused, no clubbing Skin: Normal skin turgor, no notable skin lesions seen Psychiatry: Mood normal // no visual hallucinations   Condition at discharge: good  The results of significant diagnostics from this hospitalization (including imaging, microbiology, ancillary and laboratory) are listed below for reference.   Imaging Studies: CT Angio Chest PE W and/or Wo Contrast  Result Date: 10/10/2021 CLINICAL DATA:  COVID positive 10 days ago with worsening symptoms. EXAM: CT ANGIOGRAPHY CHEST WITH CONTRAST TECHNIQUE: Multidetector CT imaging of the chest was performed using the standard protocol during bolus administration of intravenous contrast. Multiplanar CT image reconstructions and MIPs were obtained to evaluate the vascular anatomy. RADIATION DOSE REDUCTION: This exam was performed according to the departmental dose-optimization program which includes automated exposure control, adjustment of the mA and/or kV according to patient size and/or use of iterative reconstruction technique. CONTRAST:  175mL OMNIPAQUE IOHEXOL 350 MG/ML SOLN COMPARISON:  None. FINDINGS: Cardiovascular: Satisfactory opacification of the pulmonary arteries to the segmental  level. No evidence of pulmonary embolism. There is mild cardiomegaly with mild coronary artery calcification. No pericardial effusion. Mediastinum/Nodes: No enlarged mediastinal, hilar, or axillary lymph nodes. Thyroid gland, trachea, and esophagus demonstrate no significant findings. Lungs/Pleura: Marked severity multifocal infiltrates are seen throughout both lungs. Very small bilateral pleural effusions are noted. No pneumothorax is identified. Upper Abdomen: No acute abnormality. Musculoskeletal: A nondisplaced fracture deformity of the right transverse process is seen at the level of T1 (axial CT image 9, CT series 6). Multilevel degenerative changes seen throughout the thoracic spine. Review of the MIP images confirms the above findings. IMPRESSION: 1. Marked severity bilateral multifocal infiltrates. 2. Very small bilateral pleural effusions. 3. Nondisplaced fracture deformity of the right transverse process at the level of T1. Electronically Signed   By: Virgina Norfolk M.D.   On: 10/10/2021 19:28   DG CHEST PORT 1 VIEW  Result Date: 10/14/2021 CLINICAL DATA:  COVID positive, hypoxia EXAM: PORTABLE CHEST 1 VIEW COMPARISON:  10/10/2021 FINDINGS: Mild cardiomegaly, vascular congestion. Patchy interstitial and airspace opacities within the lungs, similar to prior study. Findings likely reflect COVID pneumonia. No effusions or pneumothorax. No acute bony abnormality. IMPRESSION: Patchy reticulonodular opacities throughout the lungs, likely COVID pneumonia. No significant change since prior study. Electronically Signed   By: Rolm Baptise M.D.   On: 10/14/2021 18:55   DG Chest Portable 1 View  Result Date: 10/10/2021 CLINICAL DATA:  Worsening COVID symptoms. Body aches, diarrhea, dry heaves, loss of appetite. Shortness of breath with exertion. EXAM: PORTABLE CHEST 1 VIEW COMPARISON:  06/28/2018. FINDINGS: Trachea is midline. Heart size stable. Mild reticulonodular opacification in the lungs. Discrete 10  mm nodule in the left upper lung zone. Tiny right pleural effusion. IMPRESSION: 1. Reticulonodular pattern in the lungs may be due to COVID pneumonia. Recommend follow-up to clearing. 2. Discrete 10 mm nodular density in the left upper lung zone. This can be reassessed on follow-up imaging recommended above. 3. Tiny right pleural effusion. Electronically Signed   By: Lorin Picket M.D.   On: 10/10/2021 15:17   ECHOCARDIOGRAM COMPLETE  Result Date: 10/14/2021    ECHOCARDIOGRAM REPORT   Patient Name:   TERISA BELARDO Date of Exam: 10/14/2021 Medical Rec #:  [id number]        Height:       65.0 in Accession #:    [id number]       Weight:       257.9 lb Date of Birth:  09-25-1950        BSA:          2.204 m Patient Age:    70 years         BP:           165/93 mmHg Patient Gender: F                HR:           75 bpm. Exam Location:  Inpatient Procedure: 2D Echo, Color Doppler, Cardiac Doppler and Intracardiac            Opacification Agent Indications:    CHF-Acute Diastolic Y50.35  History:        Patient has prior history of Echocardiogram examinations, most                 recent 01/01/2017. Risk Factors:Hypertension. GERD.  Sonographer:    Darlina Sicilian RDCS Referring Phys: [place]  1. Left ventricular ejection fraction, by estimation, is 55 to 60%. The left ventricle has normal function. The left ventricle has no regional wall motion abnormalities. The left ventricular internal cavity size was mildly dilated. There is mild left ventricular hypertrophy. Left ventricular diastolic function could not be evaluated.  2. Right ventricular systolic function is mildly reduced. The right ventricular size is mildly enlarged. There is mildly elevated pulmonary artery systolic pressure.  3. The mitral valve is normal in structure. Mild mitral valve regurgitation. No evidence of mitral stenosis.  4. The aortic valve is tricuspid. Aortic valve regurgitation is trivial. Aortic valve sclerosis is  present, with no evidence of aortic valve stenosis.  5. Aortic dilatation noted. There is mild dilatation of the aortic root, measuring 41 mm. There is mild dilatation of the ascending aorta, measuring 41 mm.  6. The inferior vena cava is dilated in size with >50% respiratory variability, suggesting right atrial pressure of 8 mmHg. FINDINGS  Left Ventricle: Left ventricular ejection fraction, by estimation, is 55 to 60%. The left ventricle has normal function. The left ventricle has no regional wall motion abnormalities. Definity contrast agent was given IV to delineate the left ventricular  endocardial borders. The left ventricular internal cavity size was mildly dilated. There is mild left ventricular hypertrophy. Left ventricular diastolic function could not be evaluated due to atrial fibrillation. Left ventricular diastolic function could not be evaluated. Right Ventricle: The right ventricular size is mildly enlarged. Right ventricular systolic function is mildly reduced. There is mildly elevated pulmonary artery systolic pressure. The tricuspid regurgitant velocity is 2.70 m/s, and with an assumed right atrial pressure of 8 mmHg, the estimated right ventricular systolic pressure is 46.5 mmHg. Left Atrium: Left atrial size was normal in size. Right Atrium: Right atrial size was normal in size. Pericardium: There is no evidence of pericardial effusion. Mitral Valve: The mitral valve is normal in structure. Mild mitral valve regurgitation. No evidence of mitral valve stenosis. Tricuspid Valve: The tricuspid valve is normal in structure. Tricuspid valve regurgitation is mild . No evidence of tricuspid stenosis. Aortic Valve: The aortic valve is tricuspid. Aortic valve regurgitation is trivial. Aortic valve sclerosis is present, with no evidence of aortic valve stenosis. Pulmonic Valve: The pulmonic valve was not well visualized. Pulmonic valve regurgitation is trivial. No evidence of pulmonic stenosis. Aorta:  Aortic dilatation noted. There is mild dilatation of the aortic root, measuring 41 mm. There is mild dilatation of the ascending aorta, measuring 41 mm. Venous: The inferior vena cava is dilated  in size with greater than 50% respiratory variability, suggesting right atrial pressure of 8 mmHg. IAS/Shunts: No atrial level shunt detected by color flow Doppler.  LEFT VENTRICLE PLAX 2D LVIDd:         5.50 cm      Diastology LVIDs:         3.90 cm      LV e' medial:    9.36 cm/s LV PW:         1.10 cm      LV E/e' medial:  11.2 LV IVS:        1.20 cm      LV e' lateral:   11.50 cm/s LVOT diam:     2.25 cm      LV E/e' lateral: 9.1 LV SV:         58 LV SV Index:   26 LVOT Area:     3.98 cm  LV Volumes (MOD) LV vol d, MOD A2C: 116.0 ml LV vol d, MOD A4C: 135.0 ml LV vol s, MOD A2C: 56.5 ml LV vol s, MOD A4C: 67.5 ml LV SV MOD A2C:     59.5 ml LV SV MOD A4C:     135.0 ml LV SV MOD BP:      64.5 ml RIGHT VENTRICLE RV S prime:     11.40 cm/s TAPSE (M-mode): 1.7 cm LEFT ATRIUM             Index        RIGHT ATRIUM           Index LA diam:        4.00 cm 1.81 cm/m   RA Area:     19.60 cm LA Vol (A2C):   58.8 ml 26.68 ml/m  RA Volume:   56.60 ml  25.68 ml/m LA Vol (A4C):   56.2 ml 25.50 ml/m LA Biplane Vol: 59.2 ml 26.86 ml/m  AORTIC VALVE LVOT Vmax:   68.30 cm/s LVOT Vmean:  45.300 cm/s LVOT VTI:    0.146 m  AORTA Ao Root diam: 3.60 cm Ao Asc diam:  4.10 cm MITRAL VALVE                TRICUSPID VALVE MV Area (PHT): 4.35 cm     TR Peak grad:   29.2 mmHg MV Decel Time: 175 msec     TR Vmax:        270.00 cm/s MV E velocity: 105.00 cm/s                             SHUNTS                             Systemic VTI:  0.15 m                             Systemic Diam: 2.25 cm Kirk Ruths MD Electronically signed by Kirk Ruths MD Signature Date/Time: 10/14/2021/11:36:33 AM    Final     Microbiology: Results for orders placed or performed during the hospital encounter of 10/10/21  Resp Panel by RT-PCR (Flu A&B, Covid)  Nasopharyngeal Swab     Status: Abnormal   Collection Time: 10/10/21  4:00 PM   Specimen: Nasopharyngeal Swab; Nasopharyngeal(NP) swabs in vial transport medium  Result Value Ref Range Status   SARS Coronavirus 2 by RT  PCR POSITIVE (A) NEGATIVE Final    Comment: (NOTE) SARS-CoV-2 target nucleic acids are DETECTED.  The SARS-CoV-2 RNA is generally detectable in upper respiratory specimens during the acute phase of infection. Positive results are indicative of the presence of the identified virus, but do not rule out bacterial infection or co-infection with other pathogens not detected by the test. Clinical correlation with patient history and other diagnostic information is necessary to determine patient infection status. The expected result is Negative.  Fact Sheet for Patients: EntrepreneurPulse.com.au  Fact Sheet for Healthcare Providers: IncredibleEmployment.be  This test is not yet approved or cleared by the [place] FDA and  has been authorized for detection and/or diagnosis of SARS-CoV-2 by FDA under an Emergency Use Authorization (EUA).  This EUA will remain in effect (meaning this test can be used) for the duration of  the COVID-19 declaration under Section 564(b)(1) of the A ct, 21 U.S.C. section 360bbb-3(b)(1), unless the authorization is terminated or revoked sooner.     Influenza A by PCR NEGATIVE NEGATIVE Final   Influenza B by PCR NEGATIVE NEGATIVE Final    Comment: (NOTE) The Xpert Xpress SARS-CoV-2/FLU/RSV plus assay is intended as an aid in the diagnosis of influenza from Nasopharyngeal swab specimens and should not be used as a sole basis for treatment. Nasal washings and aspirates are unacceptable for Xpert Xpress SARS-CoV-2/FLU/RSV testing.  Fact Sheet for Patients: EntrepreneurPulse.com.au  Fact Sheet for Healthcare Providers: IncredibleEmployment.be  This test is not yet  approved or cleared by the [place] FDA and has been authorized for detection and/or diagnosis of SARS-CoV-2 by FDA under an Emergency Use Authorization (EUA). This EUA will remain in effect (meaning this test can be used) for the duration of the COVID-19 declaration under Section 564(b)(1) of the Act, 21 U.S.C. section 360bbb-3(b)(1), unless the authorization is terminated or revoked.  Performed at [facility], [address] [place], [place] [address]     Labs: CBC: Recent Labs  Lab 10/10/21 1600 10/11/21 0004 10/12/21 0520 10/13/21 0459 10/14/21 0929  WBC 4.1 5.5 5.9 7.4 9.4  NEUTROABS 3.2  --   --   --   --   HGB 11.3* 11.4* 10.7* 11.0* 11.8*  HCT 32.5* 33.5* 32.6* 33.5* 35.1*  MCV 80.6 83.5 85.8 87.2 84.4  PLT 277 269 294 322 604   Basic Metabolic Panel: Recent Labs  Lab 10/10/21 1600 10/11/21 0004 10/12/21 0520 10/13/21 0459 10/14/21 0929 10/15/21 0825  NA 132* 130* 133* 131* 136 133*  K 2.5* 2.8* 4.5 4.2 4.4 3.8  CL 87* 91* 96* 95* 100 93*  CO2 35* 31 31 29 29 31   GLUCOSE 117* 140* 158* 180* 146* 96  BUN 10 10 14 19 20  24*  CREATININE 0.90 0.89 0.90 0.95 0.79 1.03*  CALCIUM 8.6* 7.8* 8.5* 8.6* 9.1 8.9  MG 1.7 1.9 2.4 2.1  --   --   PHOS  --  2.3*  --   --   --   --    Liver Function Tests: Recent Labs  Lab 10/10/21 1600 10/14/21 0929 10/15/21 0825  AST 29 19 18   ALT 21 23 24   ALKPHOS 47 43 49  BILITOT 0.8 0.2* 0.4  PROT 6.3* 6.0* 6.1*  ALBUMIN 3.3* 2.8* 2.8*   CBG: No results for input(s): GLUCAP in the last 168 hours.  Discharge time spent: less than 30 minutes.  Signed: Marylu Lund, MD [facility] 10/15/2021

## 2021-10-15 NOTE — Progress Notes (Signed)
Occupational Therapy Evaluation Patient Details Name: [name] MRN: [id number] DOB: [birth year] Today's Date: 10/15/2021   History of Present Illness 71 year old female admitted with fatigue, aches, nausea, loose stool and loss of appetite. Pt is + COVID-19. PMH significant for rheumatoid arthritis, hypertension   Clinical Impression   Pt was seen for OT evaluation this date. Pt is alert and oriented x4, husband present throughout evaluation. Prior to hospital admission, pt was MOD I for ADL except required assist for LB dressing. Pt lives with her husband in a 2 story home, is able to live on the first floor if needed.  At this time pt is demonstrating deficits in strength and endurance as evidenced by requiring frequent rest breaks during ADL tasks, inability to stand for extended periods of time during ADL task completion. Supv required for transfers with RW, MIN A for HHA. Pt's impairments functionally limit her ability to perform ADL/self-care tasks. Pt would benefit from skilled OT services to address noted impairments and functional limitations (see below for any additional details) in order to maximize safety and independence while minimizing falls risk and caregiver burden. Additional education provided to husband and pt regarding home safety, energy conservation techniques, safe ADL/IADL task completion, falls prevention. Upon hospital discharge, recommend HHOT to maximize pt safety and return to functional independence during meaningful occupations of daily life.     Recommendations for follow up therapy are one component of a multi-disciplinary discharge planning process, led by the attending physician.  Recommendations may be updated based on patient status, additional functional criteria and insurance authorization.   Follow Up Recommendations  Home health OT    Assistance Recommended at Discharge Intermittent Supervision/Assistance  Patient can return home with the following A  little help with walking and/or transfers;A little help with bathing/dressing/bathroom;Assistance with cooking/housework;Assist for transportation;Help with stairs or ramp for entrance    Functional Status Assessment  Patient has had a recent decline in their functional status and demonstrates the ability to make significant improvements in function in a reasonable and predictable amount of time.  Equipment Recommendations  None recommended by OT    Recommendations for Other Services       Precautions / Restrictions Precautions Precautions: Fall Restrictions Weight Bearing Restrictions: No      Mobility Bed Mobility               General bed mobility comments: recieved in chair    Transfers Overall transfer level: Needs assistance Equipment used: Rolling walker (2 wheels) Transfers: Sit to/from Stand Sit to Stand: Supervision                  Balance Overall balance assessment: Needs assistance Sitting-balance support: Feet supported Sitting balance-Leahy Scale: Normal     Standing balance support: Bilateral upper extremity supported, During functional activity Standing balance-Leahy Scale: Good                             ADL either performed or assessed with clinical judgement   ADL Overall ADL's : Needs assistance/impaired Eating/Feeding: Set up;Sitting   Grooming: Wash/dry hands;Standing;Oral care;Sitting;Min guard Grooming Details (indicate cue type and reason): CGA standing at sink for approx 3 minutes, then pt reporting nausea and fatigue requesting to sit. Completed grooming tasks in sitting. SPO2 down to 88% with activity.             Lower Body Dressing: Total assistance Lower Body Dressing Details (indicate cue  type and reason): baseline due to prior back surgerys. Pt reports she has AE that she does not use. Husband assists. Toilet Transfer: Buyer, retail Details (indicate cue  type and reason): SUP with RW, MIN A with HHA from bedside chair<>commode Toileting- Clothing Manipulation and Hygiene: Maximal assistance;Sit to/from stand Toileting - Clothing Manipulation Details (indicate cue type and reason): for peri care     Functional mobility during ADLs: Min guard;Minimal assistance;Rolling walker (2 wheels) General ADL Comments: pt limited by nausea and fatigue     Vision   Vision Assessment?: No apparent visual deficits     Perception     Praxis      Pertinent Vitals/Pain Pain Assessment Pain Assessment: 0-10 Pain Score: 4  Pain Descriptors / Indicators: Aching, Constant Pain Intervention(s): Limited activity within patient's tolerance, Monitored during session, Repositioned     Hand Dominance     Extremity/Trunk Assessment Upper Extremity Assessment Upper Extremity Assessment: Generalized weakness   Lower Extremity Assessment Lower Extremity Assessment: Generalized weakness;LLE deficits/detail;RLE deficits/detail RLE Sensation: history of peripheral neuropathy LLE Sensation: history of peripheral neuropathy   Cervical / Trunk Assessment Cervical / Trunk Assessment: Normal   Communication Communication Communication: No difficulties   Cognition Arousal/Alertness: Awake/alert Behavior During Therapy: WFL for tasks assessed/performed Overall Cognitive Status: Within Functional Limits for tasks assessed                                 General Comments: alert and oriented x4, good safety awareness     General Comments  spo2 down to 88% with standing tasks, recovered >90% within 30 seconds; HR with activity 111, below 100 within 30 seconds of rest    Exercises     Shoulder Instructions      Home Living Family/patient expects to be discharged to:: Private residence Living Arrangements: Spouse/significant other Available Help at Discharge: Family;Available 24 hours/day Type of Home: House Home Access: Level entry;Other  (comment) (garage)     Home Layout: Two level;1/2 bath on main level Alternate Level Stairs-Number of Steps: 13   Bathroom Shower/Tub: Occupational psychologist: Standard (has bidet)     Home Equipment: Grab bars - toilet;Shower Land (2 wheels);Cane - single point;Transport chair          Prior Functioning/Environment Prior Level of Function : Independent/Modified Independent             Mobility Comments: MOD I-I with no AD ADLs Comments: MOD I for most ADL; pt husband assists with LB dressing due to previous back surgeris. Pt cooks, cleans; does not drive often due to neuropathy        OT Problem List: Decreased strength;Decreased activity tolerance      OT Treatment/Interventions: Self-care/ADL training;Therapeutic exercise;Patient/family education;Therapeutic activities;DME and/or AE instruction    OT Goals(Current goals can be found in the care plan section) Acute Rehab OT Goals Patient Stated Goal: to go home OT Goal Formulation: With patient Time For Goal Achievement: 10/29/21 Potential to Achieve Goals: Good ADL Goals Pt Will Perform Grooming: standing;with modified independence;sitting Pt Will Perform Upper Body Dressing: with modified independence Pt Will Perform Lower Body Dressing: with modified independence;sit to/from stand Pt Will Transfer to Toilet: with modified independence Pt Will Perform Toileting - Clothing Manipulation and hygiene: with modified independence  OT Frequency: Min 2X/week    Co-evaluation              AM-PAC  OT "6 Clicks" Daily Activity     Outcome Measure Help from another person eating meals?: None Help from another person taking care of personal grooming?: A Little Help from another person toileting, which includes using toliet, bedpan, or urinal?: A Lot Help from another person bathing (including washing, rinsing, drying)?: A Little Help from another person to put on and taking off regular upper  body clothing?: A Little Help from another person to put on and taking off regular lower body clothing?: Total 6 Click Score: 16   End of Session Equipment Utilized During Treatment: Gait belt;Rolling walker (2 wheels) Nurse Communication: Mobility status  Activity Tolerance: Patient limited by fatigue Patient left: in chair;with call bell/phone within reach;with chair alarm set;with family/visitor present  OT Visit Diagnosis: Unsteadiness on feet (R26.81)                Time: 8016-5537 OT Time Calculation (min): 37 min Charges:  OT General Charges $OT Visit: 1 Visit OT Evaluation $OT Eval Low Complexity: 1 Low OT Treatments $Self Care/Home Management : 8-22 mins  Shanon Payor, OTD OTR/L  10/15/21, 10:11 AM

## 2021-10-15 NOTE — Progress Notes (Signed)
Patient discharged home with husband, discharge instructions given and explained to patient and husband, they verbalized understanding, patient denies any distress, no pressure injury noted. Accompanied home by husband.

## 2021-10-15 NOTE — TOC Progression Note (Signed)
Transition of Care [facility]) - Progression Note    Patient Details  Name: SHANAH GUIMARAES MRN: [id number] Date of Birth: 01-08-1951  [facility]) CM/SW Contact  Leeroy Cha, RN Phone Number: 10/15/2021, 7:48 AM  Clinical Narrative:    Progressed to p.o. meds, on 02 at 12l/min, following for toc needs.   Expected Discharge Plan: Home/Self Care Barriers to Discharge: Continued Medical Work up  Expected Discharge Plan and Services Expected Discharge Plan: Home/Self Care   Discharge Planning Services: CM Consult   Living arrangements for the past 2 months: Single Family Home                                       Social Determinants of Health (SDOH) Interventions    Readmission Risk Interventions No flowsheet data found.

## 2021-10-15 NOTE — Progress Notes (Signed)
SATURATION QUALIFICATIONS: (This note is used to comply with regulatory documentation for home oxygen)  Patient Saturations on Room Air at Rest = 95%  Patient Saturations on Room Air while Ambulating = 86%  Patient Saturations on 1 Liters of oxygen while Ambulating = 93%  Please briefly explain why patient needs home oxygen: Patient desats to 18s with activity.

## 2021-10-15 NOTE — Discharge Instructions (Signed)

## 2021-10-16 ENCOUNTER — Telehealth: Payer: Self-pay

## 2021-10-16 NOTE — Telephone Encounter (Incomplete Revision)
Transition Care Management Unsuccessful Follow-up Telephone Call  Date of discharge and from where:  [place] 10-15-21 D: COVID-acute hypoxic respiratory failure   Attempts:  1st Attempt  Reason for unsuccessful TCM follow-up call:  Left voice message  Transition Care Management Unsuccessful Follow-up Telephone Call  Date of discharge and from where:  [place] 10-15-21 D: COVID-acute hypoxic respiratory failure   Attempts:  2nd Attempt  Reason for unsuccessful TCM follow-up call:  Left voice message

## 2021-10-16 NOTE — Telephone Encounter (Addendum)
Transition Care Management Unsuccessful Follow-up Telephone Call  Date of discharge and from where:  [place] 10-15-21 D: COVID-acute hypoxic respiratory failure   Attempts:  1st Attempt  Reason for unsuccessful TCM follow-up call:  Left voice message  Transition Care Management Unsuccessful Follow-up Telephone Call  Date of discharge and from where:  [place] 10-15-21 D: COVID-acute hypoxic respiratory failure   Attempts:  2nd Attempt  Reason for unsuccessful TCM follow-up call:  Left voice message  Transition Care Management Follow-up Telephone Call Date of discharge and from where: [place] 10-15-21 D: COVID-acute hypoxic respiratory failure  How have you been since you were released from the hospital? Doing ok  Any questions or concerns? No  Items Reviewed: Did the pt receive and understand the discharge instructions provided? Yes  Medications obtained and verified? Yes  Other? No  Any new allergies since your discharge? No  Dietary orders reviewed? Yes Do you have support at home? Yes   Home Care and Equipment/Supplies: Were home health services ordered? Yes PT If so, what is the name of the agency?  [facility]  Has the agency set up a time to come to the patient's home? yes Were any new equipment or medical supplies ordered?  Yes: oxygen What is the name of the medical supply agency? [facility]  Were you able to get the supplies/equipment? yes Do you have any questions related to the use of the equipment or supplies? No  Functional Questionnaire: (I = Independent and D = Dependent) ADLs: I  Bathing/Dressing- I  Meal Prep- I  Eating- I  Maintaining continence- I  Transferring/Ambulation- I  Managing Meds- I  Follow up appointments reviewed:  PCP Hospital f/u appt confirmed? Yes  Scheduled to see Dr Ethlyn Gallery on 10-24-21 @ 130pm. [place] [facility] [facility] f/u appt confirmed? no Are transportation arrangements needed? No   If their condition worsens, is the pt aware to call PCP or go to the Emergency Dept.? Yes Was the patient provided with contact information for the PCP's office or ED? Yes Was to pt encouraged to call back with questions or concerns? Yes

## 2021-10-16 NOTE — Progress Notes (Signed)
After d/c note -Patient/spouse had no preference-Provided patient w/HHC services-[facility] rep Stacie rep HHPT/OT;[facility] rep danielle for home 02 delivered travel tank to rm prior d/c.

## 2021-10-17 ENCOUNTER — Other Ambulatory Visit (HOSPITAL_COMMUNITY): Payer: Self-pay

## 2021-10-17 ENCOUNTER — Telehealth: Payer: Self-pay | Admitting: Family Medicine

## 2021-10-17 DIAGNOSIS — M5417 Radiculopathy, lumbosacral region: Secondary | ICD-10-CM | POA: Diagnosis not present

## 2021-10-17 DIAGNOSIS — M069 Rheumatoid arthritis, unspecified: Secondary | ICD-10-CM | POA: Diagnosis not present

## 2021-10-17 DIAGNOSIS — J9601 Acute respiratory failure with hypoxia: Secondary | ICD-10-CM | POA: Diagnosis not present

## 2021-10-17 DIAGNOSIS — M48062 Spinal stenosis, lumbar region with neurogenic claudication: Secondary | ICD-10-CM | POA: Diagnosis not present

## 2021-10-17 DIAGNOSIS — E871 Hypo-osmolality and hyponatremia: Secondary | ICD-10-CM | POA: Diagnosis not present

## 2021-10-17 DIAGNOSIS — K219 Gastro-esophageal reflux disease without esophagitis: Secondary | ICD-10-CM | POA: Diagnosis not present

## 2021-10-17 DIAGNOSIS — D509 Iron deficiency anemia, unspecified: Secondary | ICD-10-CM | POA: Diagnosis not present

## 2021-10-17 DIAGNOSIS — Z86010 Personal history of colonic polyps: Secondary | ICD-10-CM | POA: Diagnosis not present

## 2021-10-17 DIAGNOSIS — Z792 Long term (current) use of antibiotics: Secondary | ICD-10-CM | POA: Diagnosis not present

## 2021-10-17 DIAGNOSIS — E785 Hyperlipidemia, unspecified: Secondary | ICD-10-CM | POA: Diagnosis not present

## 2021-10-17 DIAGNOSIS — I7381 Erythromelalgia: Secondary | ICD-10-CM | POA: Diagnosis not present

## 2021-10-17 DIAGNOSIS — Z7901 Long term (current) use of anticoagulants: Secondary | ICD-10-CM | POA: Diagnosis not present

## 2021-10-17 DIAGNOSIS — G629 Polyneuropathy, unspecified: Secondary | ICD-10-CM | POA: Diagnosis not present

## 2021-10-17 DIAGNOSIS — E876 Hypokalemia: Secondary | ICD-10-CM | POA: Diagnosis not present

## 2021-10-17 DIAGNOSIS — E213 Hyperparathyroidism, unspecified: Secondary | ICD-10-CM | POA: Diagnosis not present

## 2021-10-17 DIAGNOSIS — Z87891 Personal history of nicotine dependence: Secondary | ICD-10-CM | POA: Diagnosis not present

## 2021-10-17 DIAGNOSIS — M810 Age-related osteoporosis without current pathological fracture: Secondary | ICD-10-CM | POA: Diagnosis not present

## 2021-10-17 DIAGNOSIS — Z7951 Long term (current) use of inhaled steroids: Secondary | ICD-10-CM | POA: Diagnosis not present

## 2021-10-17 DIAGNOSIS — U071 COVID-19: Secondary | ICD-10-CM | POA: Diagnosis not present

## 2021-10-17 DIAGNOSIS — Z9981 Dependence on supplemental oxygen: Secondary | ICD-10-CM | POA: Diagnosis not present

## 2021-10-17 DIAGNOSIS — M19049 Primary osteoarthritis, unspecified hand: Secondary | ICD-10-CM | POA: Diagnosis not present

## 2021-10-17 DIAGNOSIS — I10 Essential (primary) hypertension: Secondary | ICD-10-CM | POA: Diagnosis not present

## 2021-10-17 NOTE — Telephone Encounter (Signed)
[name] PT with [facility] is calling and needs verbal orders for 1x5 and then eow x4  ?

## 2021-10-17 NOTE — Telephone Encounter (Signed)
ok 

## 2021-10-18 ENCOUNTER — Encounter: Payer: Medicare Other | Admitting: Neurology

## 2021-10-18 ENCOUNTER — Other Ambulatory Visit (HOSPITAL_COMMUNITY): Payer: Self-pay

## 2021-10-18 ENCOUNTER — Telehealth (HOSPITAL_COMMUNITY): Payer: Self-pay

## 2021-10-18 NOTE — Telephone Encounter (Signed)
Spoke with Sharyn Lull and informed her of the approval for orders as below. ?

## 2021-10-18 NOTE — Telephone Encounter (Signed)
Pharmacy Transitions of Care Follow-up Telephone Call ? ?Date of discharge: 10/15/21  ?Discharge Diagnosis: COVID19 ? ?How have you been since you were released from the hospital? Patient is doing well since discharge. No questions about meds at this time. Patient is still on oxygen but is improving.  ? ?Medication changes made at discharge: ?    START taking: ?Eliquis (apixaban)  ?predniSONE (DELTASONE)  ?STOP taking: ?aspirin 81 MG tablet  ? ?Medication changes verified by the patient? Yes ?  ? ?Medication Accessibility: ? ?Home Pharmacy: [facility]  ? ?Was the patient provided with refills on discharged medications? No  ? ?Have all prescriptions been transferred from [facility] to home pharmacy? N/A  ? ?Is the patient able to afford medications? Patient has insurance ?  ? ?Medication Review: ? ?APIXABAN (ELIQUIS)  ?Apixaban 5 mg BID initiated on 10/15/21.  ?- Discussed importance of taking medication around the same time everyday  ?- Advised patient of medications to avoid (NSAIDs, ASA)  ?- Educated that Tylenol (acetaminophen) will be the preferred analgesic to prevent risk of bleeding  ?- Emphasized importance of monitoring for signs and symptoms of bleeding (abnormal bruising, prolonged bleeding, nose bleeds, bleeding from gums, discolored urine, black tarry stools)  ?- Advised patient to alert all providers of anticoagulation therapy prior to starting a new medication or having a procedure  ? ? ?Follow-up Appointments: ? ?PCP Hospital f/u appt confirmed? Scheduled to see Dr. Ethlyn Gallery on 10/24/21 @ 1:30pm.  ? ?Specialist Hospital f/u appt confirmed? Scheduled to see Dr. Posey Pronto on 11/02/21 @ 3:30pm.  ? ?If their condition worsens, is the pt aware to call PCP or go to the Emergency Dept.? Yes ? ?Final Patient Assessment: ?Patient has f/u scheduled and knows to get refills at f/u if needed ? ?

## 2021-10-24 ENCOUNTER — Other Ambulatory Visit: Payer: Self-pay

## 2021-10-24 ENCOUNTER — Encounter: Payer: Self-pay | Admitting: Family Medicine

## 2021-10-24 ENCOUNTER — Telehealth (INDEPENDENT_AMBULATORY_CARE_PROVIDER_SITE_OTHER): Payer: Medicare Other | Admitting: Family Medicine

## 2021-10-24 ENCOUNTER — Telehealth: Payer: Self-pay | Admitting: *Deleted

## 2021-10-24 VITALS — BP 142/80 | HR 71

## 2021-10-24 DIAGNOSIS — K219 Gastro-esophageal reflux disease without esophagitis: Secondary | ICD-10-CM | POA: Diagnosis not present

## 2021-10-24 DIAGNOSIS — M48062 Spinal stenosis, lumbar region with neurogenic claudication: Secondary | ICD-10-CM | POA: Diagnosis not present

## 2021-10-24 DIAGNOSIS — U099 Post covid-19 condition, unspecified: Secondary | ICD-10-CM

## 2021-10-24 DIAGNOSIS — J9601 Acute respiratory failure with hypoxia: Secondary | ICD-10-CM | POA: Diagnosis not present

## 2021-10-24 DIAGNOSIS — E871 Hypo-osmolality and hyponatremia: Secondary | ICD-10-CM | POA: Diagnosis not present

## 2021-10-24 DIAGNOSIS — D509 Iron deficiency anemia, unspecified: Secondary | ICD-10-CM | POA: Diagnosis not present

## 2021-10-24 DIAGNOSIS — M19049 Primary osteoarthritis, unspecified hand: Secondary | ICD-10-CM | POA: Diagnosis not present

## 2021-10-24 DIAGNOSIS — R531 Weakness: Secondary | ICD-10-CM | POA: Diagnosis not present

## 2021-10-24 DIAGNOSIS — E785 Hyperlipidemia, unspecified: Secondary | ICD-10-CM | POA: Diagnosis not present

## 2021-10-24 DIAGNOSIS — R911 Solitary pulmonary nodule: Secondary | ICD-10-CM | POA: Diagnosis not present

## 2021-10-24 DIAGNOSIS — I4891 Unspecified atrial fibrillation: Secondary | ICD-10-CM

## 2021-10-24 DIAGNOSIS — Z7901 Long term (current) use of anticoagulants: Secondary | ICD-10-CM | POA: Diagnosis not present

## 2021-10-24 DIAGNOSIS — M810 Age-related osteoporosis without current pathological fracture: Secondary | ICD-10-CM | POA: Diagnosis not present

## 2021-10-24 DIAGNOSIS — Z7951 Long term (current) use of inhaled steroids: Secondary | ICD-10-CM | POA: Diagnosis not present

## 2021-10-24 DIAGNOSIS — U071 COVID-19: Secondary | ICD-10-CM | POA: Diagnosis not present

## 2021-10-24 DIAGNOSIS — I7381 Erythromelalgia: Secondary | ICD-10-CM | POA: Diagnosis not present

## 2021-10-24 DIAGNOSIS — Z87891 Personal history of nicotine dependence: Secondary | ICD-10-CM | POA: Diagnosis not present

## 2021-10-24 DIAGNOSIS — Z792 Long term (current) use of antibiotics: Secondary | ICD-10-CM | POA: Diagnosis not present

## 2021-10-24 DIAGNOSIS — Z86010 Personal history of colonic polyps: Secondary | ICD-10-CM | POA: Diagnosis not present

## 2021-10-24 DIAGNOSIS — M5417 Radiculopathy, lumbosacral region: Secondary | ICD-10-CM | POA: Diagnosis not present

## 2021-10-24 DIAGNOSIS — E876 Hypokalemia: Secondary | ICD-10-CM

## 2021-10-24 DIAGNOSIS — I10 Essential (primary) hypertension: Secondary | ICD-10-CM | POA: Diagnosis not present

## 2021-10-24 DIAGNOSIS — M069 Rheumatoid arthritis, unspecified: Secondary | ICD-10-CM | POA: Diagnosis not present

## 2021-10-24 DIAGNOSIS — Z9981 Dependence on supplemental oxygen: Secondary | ICD-10-CM | POA: Diagnosis not present

## 2021-10-24 DIAGNOSIS — E213 Hyperparathyroidism, unspecified: Secondary | ICD-10-CM | POA: Diagnosis not present

## 2021-10-24 DIAGNOSIS — G629 Polyneuropathy, unspecified: Secondary | ICD-10-CM | POA: Diagnosis not present

## 2021-10-24 NOTE — Telephone Encounter (Signed)
Spoke with the patient's husband and informed him of the message below.  ?

## 2021-10-24 NOTE — Progress Notes (Signed)
Virtual Visit via Video Note ? ?I connected with [name] on 10/24/21 at  1:30 PM EST by a video enabled telemedicine application and verified that I am speaking with the correct person using two identifiers. ? Location patient: home ?Location provider: ?[place]  ?[place]  ?[place], [place] [address] ?Persons participating in the virtual visit: patient, provider ? ?I discussed the limitations of evaluation and management by telemedicine and the availability of in person appointments. The patient expressed understanding and agreed to proceed. ? ? ?[name] ?DOB: [birth year] ?Encounter date: 10/24/2021 ? ?This is a 71 y.o. female who presents with ?Chief Complaint  ?Patient presents with  ? Hospitalization Follow-up  ? ? ?History of present illness: ?Feels like she is doing well. Therapist came out today and thought she was doing well.  ? ?Sometimes will use oxygen after exertion, but use is sporatic. Oxygen has been staying up; she is checking with pulse ox regularly - usually 93-94 sometimes up to 98%. No cough.  ? ?Strength overall she states is so much better than it was. Before was very weak. Now can get up stairs for shower. Can go to the bathroom. Staying more downstairs. Sleeps in chair (this is her baseline though).  ? ? ?Allergies  ?Allergen Reactions  ? Amlodipine Besylate Other (See Comments)  ?  Tremors  ? Depakote [Divalproex Sodium] Swelling  ? Nortriptyline Other (See Comments)  ?  tremors  ? Dilaudid [Hydromorphone Hcl]   ?  Headache, muscle tightness  ? Tramadol   ?  Felt "stoned"  ? ?Current Meds  ?Medication Sig  ? apixaban (ELIQUIS) 5 MG TABS tablet Take 1 tablet (5 mg total) by mouth 2 (two) times daily.  ? Cholecalciferol (VITAMIN D) 125 MCG (5000 UT) CAPS Take 5,000 Units by mouth daily.  ? diclofenac sodium (VOLTAREN) 1 % GEL Apply 2 g topically 4 (four) times daily as needed (pain).  ? Fexofenadine HCl (MUCINEX ALLERGY PO) Take by mouth daily as needed.  ?  furosemide (LASIX) 20 MG tablet Take 2 - 3 tablets by mouth 2 times daily as needed for edema. (need appt for refills)  ? gabapentin (NEURONTIN) 100 MG capsule TAKE 3 CAPSULES IN THE MORNING AND 2 CAPSULES AT BEDTIME. OK TO TAKE EXTRA CAPSULE AT BEDTIME AS NEEDED (Patient taking differently: Take 200-300 mg by mouth See admin instructions.)  ? hydroxychloroquine (PLAQUENIL) 200 MG tablet Take 1 tablet by mouth with food or milk twice a day (Patient taking differently: Take 200 mg by mouth 2 (two) times daily.)  ? hydroxypropyl methylcellulose / hypromellose (ISOPTO TEARS / GONIOVISC) 2.5 % ophthalmic solution Place 1 drop into both eyes daily.  ? losartan (COZAAR) 100 MG tablet TAKE 1 TABLET BY MOUTH ONCE A DAY  ? metoprolol succinate (TOPROL-XL) 50 MG 24 hr tablet TAKE 1 TABLET BY MOUTH TWICE DAILY (Patient taking differently: Take 50 mg by mouth 2 (two) times daily.)  ? potassium chloride SA (KLOR-CON M) 20 MEQ tablet Take 1 tablet (20 mEq total) by mouth 2 (two) times daily.  ? sulfaSALAzine (AZULFIDINE) 500 MG tablet Take 2 tablets by mouth twice a day (Patient taking differently: Take 1,000 mg by mouth 2 (two) times daily. continuous)  ? vitamin C (ASCORBIC ACID) 500 MG tablet Take 500 mg by mouth daily.  ? ? ?Review of Systems  ?Constitutional:  Positive for fatigue. Negative for chills and fever.  ?Respiratory:  Positive for shortness of breath (improving;but somewhat after exertion.).  Negative for cough, chest tightness and wheezing.   ?Cardiovascular:  Positive for leg swelling. Negative for chest pain and palpitations.  ?Neurological:  Positive for weakness.  ? ?Objective: ? ?BP (!) 142/80   Pulse 71   SpO2 94%      ? ?BP Readings from Last 3 Encounters:  ?10/24/21 (!) 142/80  ?10/15/21 116/72  ?08/29/21 130/70  ? ?Wt Readings from Last 3 Encounters:  ?10/10/21 257 lb 15 oz (117 kg)  ?08/29/21 262 lb 6.4 oz (119 kg)  ?07/30/21 260 lb 9.6 oz (118.2 kg)  ? ? ?EXAM: ? ?GENERAL: alert, oriented, appears  tired, but in no acute distress. ? ?HEENT: atraumatic, conjunctiva clear, no obvious abnormalities on inspection of external nose and ears ? ?NECK: normal movements of the head and neck ? ?LUNGS: on inspection no signs of respiratory distress, breathing rate appears normal, no obvious gross SOB, gasping or wheezing ? ?CV: no obvious cyanosis ? ?MS: moves all visible extremities without noticeable abnormality ? ?PSYCH/NEURO: pleasant and cooperative, no obvious depression or anxiety, speech and thought processing grossly intact ? ? ?Assessment/Plan ?1. Post-COVID syndrome ?There was question of nodule noted on initial x-ray, this was not confirmed on CTA or follow-up chest x-ray.  We will repeat a chest x-ray to see how prior COVID-pneumonia is looking and see if there is still any concern for nodule. ?- DG Chest 2 View; Future ? ?2. Weakness ?Patient is working with physical therapy.  Encouraged her to make sure she is getting in protein with each meal.  If appetite is not up to speed, suggested taking meal replacement/supplement drink to help keep protein levels up. ?- CBC with Differential/Platelet; Future ? ?3. New onset atrial fibrillation ([place]) ?Patient was unaware of this diagnosis, but has been taking Eliquis.  We will refer to cardiology for follow-up.  She does not feel that her heart is racing, but I am unable to evaluate this completely on a video visit. ?- Ambulatory referral to Cardiology ?- TSH; Future ? ?4. Hypokalemia ?Recheck blood work next week.  Want to make sure that she is maintaining electrolytes and she was severely hypokalemic when she went into the hospital. ?- Comprehensive metabolic panel; Future ? ?5. Pulmonary nodule ?See above. ?- DG Chest 2 View; Future ? ? ? ?Return for completed bloodwork next week - ordered to [facility] so she can do walk in when able. ? ? ?I discussed the assessment and treatment plan with the patient. The patient was provided an opportunity to ask questions  and all were answered. The patient agreed with the plan and demonstrated an understanding of the instructions. ?  ?The patient was advised to call back or seek an in-person evaluation if the symptoms worsen or if the condition fails to improve as anticipated. ? ?I provided 30 minutes of face-to-face time during this encounter. ? ? ?[name], MD  ?  ?

## 2021-10-24 NOTE — Telephone Encounter (Signed)
-----   Message from Caren Macadam, MD sent at 10/24/2021  2:03 PM EST ----- ?Please let patient know I ordered repeat cxr as well for next week for her to complete. If abnormality seen on this then we will consider additional imaging.if covid infection has cleared, we should get better picture for comparison to prior chest xrays. So she will do xray and labs at [facility] next week. ?

## 2021-10-31 DIAGNOSIS — Z7901 Long term (current) use of anticoagulants: Secondary | ICD-10-CM | POA: Diagnosis not present

## 2021-10-31 DIAGNOSIS — Z9981 Dependence on supplemental oxygen: Secondary | ICD-10-CM | POA: Diagnosis not present

## 2021-10-31 DIAGNOSIS — M810 Age-related osteoporosis without current pathological fracture: Secondary | ICD-10-CM | POA: Diagnosis not present

## 2021-10-31 DIAGNOSIS — Z792 Long term (current) use of antibiotics: Secondary | ICD-10-CM | POA: Diagnosis not present

## 2021-10-31 DIAGNOSIS — I7381 Erythromelalgia: Secondary | ICD-10-CM | POA: Diagnosis not present

## 2021-10-31 DIAGNOSIS — E213 Hyperparathyroidism, unspecified: Secondary | ICD-10-CM | POA: Diagnosis not present

## 2021-10-31 DIAGNOSIS — E785 Hyperlipidemia, unspecified: Secondary | ICD-10-CM | POA: Diagnosis not present

## 2021-10-31 DIAGNOSIS — M5417 Radiculopathy, lumbosacral region: Secondary | ICD-10-CM | POA: Diagnosis not present

## 2021-10-31 DIAGNOSIS — G629 Polyneuropathy, unspecified: Secondary | ICD-10-CM | POA: Diagnosis not present

## 2021-10-31 DIAGNOSIS — U071 COVID-19: Secondary | ICD-10-CM | POA: Diagnosis not present

## 2021-10-31 DIAGNOSIS — I10 Essential (primary) hypertension: Secondary | ICD-10-CM | POA: Diagnosis not present

## 2021-10-31 DIAGNOSIS — Z86010 Personal history of colonic polyps: Secondary | ICD-10-CM | POA: Diagnosis not present

## 2021-10-31 DIAGNOSIS — D509 Iron deficiency anemia, unspecified: Secondary | ICD-10-CM | POA: Diagnosis not present

## 2021-10-31 DIAGNOSIS — E871 Hypo-osmolality and hyponatremia: Secondary | ICD-10-CM | POA: Diagnosis not present

## 2021-10-31 DIAGNOSIS — K219 Gastro-esophageal reflux disease without esophagitis: Secondary | ICD-10-CM | POA: Diagnosis not present

## 2021-10-31 DIAGNOSIS — Z7951 Long term (current) use of inhaled steroids: Secondary | ICD-10-CM | POA: Diagnosis not present

## 2021-10-31 DIAGNOSIS — E876 Hypokalemia: Secondary | ICD-10-CM | POA: Diagnosis not present

## 2021-10-31 DIAGNOSIS — J9601 Acute respiratory failure with hypoxia: Secondary | ICD-10-CM | POA: Diagnosis not present

## 2021-10-31 DIAGNOSIS — M48062 Spinal stenosis, lumbar region with neurogenic claudication: Secondary | ICD-10-CM | POA: Diagnosis not present

## 2021-10-31 DIAGNOSIS — M19049 Primary osteoarthritis, unspecified hand: Secondary | ICD-10-CM | POA: Diagnosis not present

## 2021-10-31 DIAGNOSIS — Z87891 Personal history of nicotine dependence: Secondary | ICD-10-CM | POA: Diagnosis not present

## 2021-10-31 DIAGNOSIS — M069 Rheumatoid arthritis, unspecified: Secondary | ICD-10-CM | POA: Diagnosis not present

## 2021-11-02 ENCOUNTER — Other Ambulatory Visit (HOSPITAL_COMMUNITY): Payer: Self-pay

## 2021-11-02 ENCOUNTER — Encounter: Payer: Self-pay | Admitting: Neurology

## 2021-11-02 ENCOUNTER — Telehealth (INDEPENDENT_AMBULATORY_CARE_PROVIDER_SITE_OTHER): Payer: Medicare Other | Admitting: Neurology

## 2021-11-02 VITALS — Ht 65.0 in | Wt 255.0 lb

## 2021-11-02 DIAGNOSIS — I7381 Erythromelalgia: Secondary | ICD-10-CM | POA: Diagnosis not present

## 2021-11-02 DIAGNOSIS — R202 Paresthesia of skin: Secondary | ICD-10-CM

## 2021-11-02 MED ORDER — GABAPENTIN 100 MG PO CAPS
ORAL_CAPSULE | ORAL | 3 refills | Status: DC
Start: 2021-11-02 — End: 2023-01-03
  Filled 2021-11-02 – 2022-01-02 (×2): qty 540, 90d supply, fill #0
  Filled 2022-06-06: qty 540, 90d supply, fill #1
  Filled 2022-09-18: qty 540, 90d supply, fill #2

## 2021-11-02 NOTE — Progress Notes (Signed)
? ?  Virtual Visit via Video Note ?The purpose of this virtual visit is to provide medical care while limiting exposure to the novel coronavirus.   ? ?Consent was obtained for video visit:  Yes.   ?Answered questions that patient had about telehealth interaction:  Yes.   ?I discussed the limitations, risks, security and privacy concerns of performing an evaluation and management service by telemedicine. I also discussed with the patient that there may be a patient responsible charge related to this service. The patient expressed understanding and agreed to proceed. ? ?Pt location: Home ?Physician Location: office ?Name of referring provider:  Caren Macadam, MD ?I connected with [name] at patients initiation/request on 11/02/2021 at  3:30 PM EDT by video enabled telemedicine application and verified that I am speaking with the correct person using two identifiers. ?Pt MRN:  [id number] ?Pt DOB:  1951-08-19 ?Video Participants:  [name] ? ? ?History of Present Illness: This is a 71 y.o. 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 ? ?Her feet pain seems to be adequately controlled on gabapentin '300mg'$  in the morning and '200mg'$  at bedtime.  She does not soak her feet in ice as much as previously.  She underwent procedure for venous insufficiency and feels that it helped with swelling.  ? ?Her hand tingling has become less frequent and not as bothersome.   ? ?No new complaints.  She was recently hospitalized with COVID and is recovering from this.  She still has generalized weakness, but is slowly regaining strength. ? ? ?Observations/Objective:   ?Vitals:  ? 11/02/21 1437  ?Weight: 255 lb (115.7 kg)  ?Height: '5\' 5"'$  (1.651 m)  ? ?Patient is awake, alert, and appears comfortable.  Oriented x 4.   ?Extraocular muscles are intact. No ptosis.  Face is symmetric.  Speech is not dysarthric.  ?Antigravity in all extremities.  ? ?Assessment and Plan:  ?1.  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  Pain is adequately controlled. ?Previously treid:  Lyrica (swelling), nortriptyline (tremor), Cymbalta (swelling), depakote (swelling) ?Continue gabapentin '300mg'$  in the morning and '200mg'$  at bedtime. OK to take an extra tablet as needed - refilled ? ?2.  Bilateral and paresthesias, most suggestive of carpal tunnel syndrome.  Symptoms are less frequent and not bothersome ?Continue to monitor ? ? ?Follow Up Instructions: ?  ?I discussed the assessment and treatment plan with the patient. The patient was provided an opportunity to ask questions and all were answered. The patient agreed with the plan and demonstrated an understanding of the instructions. ?  ?The patient was advised to call back or seek an in-person evaluation if the symptoms worsen or if the condition fails to improve as anticipated. ? ?Follow-up in 1 year or sooner as needed ? ? ?Alda Berthold, DO ?

## 2021-11-05 ENCOUNTER — Other Ambulatory Visit (INDEPENDENT_AMBULATORY_CARE_PROVIDER_SITE_OTHER): Payer: Medicare Other

## 2021-11-05 ENCOUNTER — Ambulatory Visit (INDEPENDENT_AMBULATORY_CARE_PROVIDER_SITE_OTHER)
Admission: RE | Admit: 2021-11-05 | Discharge: 2021-11-05 | Disposition: A | Discharge status: Home / Self Care | Payer: Medicare Other | Source: Ambulatory Visit | Attending: Family Medicine | Admitting: Family Medicine

## 2021-11-05 ENCOUNTER — Other Ambulatory Visit: Payer: Self-pay

## 2021-11-05 DIAGNOSIS — I4891 Unspecified atrial fibrillation: Secondary | ICD-10-CM

## 2021-11-05 DIAGNOSIS — E876 Hypokalemia: Secondary | ICD-10-CM

## 2021-11-05 DIAGNOSIS — R911 Solitary pulmonary nodule: Secondary | ICD-10-CM

## 2021-11-05 DIAGNOSIS — R531 Weakness: Secondary | ICD-10-CM | POA: Diagnosis not present

## 2021-11-05 DIAGNOSIS — R918 Other nonspecific abnormal finding of lung field: Secondary | ICD-10-CM | POA: Diagnosis not present

## 2021-11-05 DIAGNOSIS — U099 Post covid-19 condition, unspecified: Secondary | ICD-10-CM | POA: Diagnosis not present

## 2021-11-05 LAB — CBC WITH DIFFERENTIAL/PLATELET
Basophils Absolute: 0.1 10*3/uL (ref 0.0–0.1)
Basophils Relative: 1.5 % (ref 0.0–3.0)
Eosinophils Absolute: 0.1 10*3/uL (ref 0.0–0.7)
Eosinophils Relative: 1.8 % (ref 0.0–5.0)
HCT: 34.7 % — ABNORMAL LOW (ref 36.0–46.0)
Hemoglobin: 11.9 g/dL — ABNORMAL LOW (ref 12.0–15.0)
Lymphocytes Relative: 25.7 % (ref 12.0–46.0)
Lymphs Abs: 1.1 10*3/uL (ref 0.7–4.0)
MCHC: 34.2 g/dL (ref 30.0–36.0)
MCV: 83.9 fl (ref 78.0–100.0)
Monocytes Absolute: 0.5 10*3/uL (ref 0.1–1.0)
Monocytes Relative: 12.5 % — ABNORMAL HIGH (ref 3.0–12.0)
Neutro Abs: 2.5 10*3/uL (ref 1.4–7.7)
Neutrophils Relative %: 58.5 % (ref 43.0–77.0)
Platelets: 204 10*3/uL (ref 150.0–400.0)
RBC: 4.14 Mil/uL (ref 3.87–5.11)
RDW: 15.7 % — ABNORMAL HIGH (ref 11.5–15.5)
WBC: 4.2 10*3/uL (ref 4.0–10.5)

## 2021-11-05 LAB — TSH: TSH: 2.52 u[IU]/mL (ref 0.35–5.50)

## 2021-11-05 NOTE — Progress Notes (Deleted)
?Cardiology Office Note:   ? ?Date:  11/05/2021  ? ?ID:  [name], DOB Mar 04, 1951, MRN [id number] ? ?PCP:  Caren Macadam, MD ?  ?[facility] Providers ?Cardiologist:  None { ? ? ?Referring MD: Caren Macadam, MD  ? ? ?History of Present Illness:   ? ?[name] is a 71 y.o. female with a hx of HTN, HLD, GERD, RA and GERD who was referred by Dr. Ethlyn Gallery for further evaluation of atrial fibrillation.  ? ?Patient was diagnosed with Afib on ***. She was asymptomatic at that time. Started on apixaban for [facility]. ? ? ? ?Past Medical History:  ?Diagnosis Date  ? Allergy   ? Cataract   ? BILATERAL-REMOVED 2 YEARS AGO  ? Erythromelalgia ([place])   ? followed by neuro Dr Posey Pronto , mgd on gabapentin , dx several years ago   ? GERD (gastroesophageal reflux disease)   ? Helicobacter pylori gastritis 10/05/2018  ? Hx of colonic polyp - ssp 11/03/2014  ? Hypercalcemia   ? Hypertension   ? Left maxillary fracture ([place]) 07/17/2017  ? fell down my stairs 2 year ago , deneis any metal in place nor difficulty with jax extension   ? Neuropathy   ? 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 Osteopenia 10/17/2011  ? DEXA 09/2007: -1.4 L fem; 10/2011: -1.2 L fem   ? Osteoporosis   ? PONV (postoperative nausea and vomiting)   ? Pseudogout of foot   ? Rheumatoid arthritis(714.0) dx 2010  ? Shingles   ? hx of   ? Tibial plateau fracture, right, closed, initial encounter 07/16/2017  ? ? ?Past Surgical History:  ?Procedure Laterality Date  ? BREAST BIOPSY  1972  ? Broken wrist  2010  ? CATARACT EXTRACTION  03/2012  ? left  ? COLONOSCOPY    ? COSMETIC SURGERY    ? FRACTURE SURGERY    ? INNER EAR SURGERY    ? busted ear drum  ? MAXIMUM ACCESS (MAS)POSTERIOR LUMBAR INTERBODY FUSION (PLIF) 1 LEVEL N/A 10/11/2016  ? Procedure: Lumbar one-Sacral one Maximum access posterior lumbar interbody fusion;  Surgeon: Erline Levine, MD;  Location: [place];  Service: Neurosurgery;  Laterality: N/A;  ? ORIF WRIST FRACTURE Right 07/18/2017  ? Procedure: OPEN  REDUCTION INTERNAL FIXATION (ORIF) WRIST FRACTURE;  Surgeon: Renette Butters, MD;  Location: [place];  Service: Orthopedics;  Laterality: Right;  ? PARATHYROIDECTOMY Left 04/12/2019  ? Procedure: LEFT SUPERIOR PARATHYROIDECTOMY;  Surgeon: [name], MD;  Location: [facility];  Service: General;  Laterality: Left;  ? pneumonia  2007  ? ? ?Current Medications: ?No outpatient medications have been marked as taking for the 11/08/21 encounter (Appointment) with Freada Bergeron, MD.  ?  ? ?Allergies:   Amlodipine besylate, Depakote [divalproex sodium], Nortriptyline, Dilaudid [hydromorphone hcl], and Tramadol  ? ?Social History  ? ?Socioeconomic History  ? Marital status: Married  ?  Spouse name: Not on file  ? Number of children: 3  ? Years of education: Not on file  ? Highest education level: Not on file  ?Occupational History  ? Occupation: Retired  ?Tobacco Use  ? Smoking status: Former  ?  Packs/day: 4.00  ?  Years: 4.00  ?  Pack years: 16.00  ?  Types: Cigarettes  ?  Start date: 44  ?  Quit date: 56  ?  Years since quitting: 38.2  ? Smokeless tobacco: Never  ?Vaping Use  ? Vaping Use: Never used  ?Substance and Sexual Activity  ?  Alcohol use: No  ?  Alcohol/week: 0.0 standard drinks  ? Drug use: No  ? Sexual activity: Not on file  ?Other Topics Concern  ? Not on file  ?Social History Narrative  ? Artist -retired Building control surveyor  ? Married, lives with spouse, [name], he is IT support for [employer]  ? 3 sons  ? 2 caffeinated beverages a day  ? No regular exercise, diet is ok  ? Right handed  ? ?Social Determinants of Health  ? ?Financial Resource Strain: Not on file  ?Food Insecurity: Not on file  ?Transportation Needs: Not on file  ?Physical Activity: Not on file  ?Stress: Not on file  ?Social Connections: Not on file  ?  ? ?Family History: ?The patient's ***family history includes Heart attack in her father; Hypertension in her brother, father, and sister; Hyperthyroidism in her sister;  Hypothyroidism in her brother; Stroke in her mother. There is no history of Colon cancer, Esophageal cancer, Rectal cancer, or Stomach cancer. ? ?ROS:   ?Please see the history of present illness.    ?*** All other systems reviewed and are negative. ? ?EKGs/Labs/Other Studies Reviewed:   ? ?The following studies were reviewed today: ?TTE 10/14/21: ?IMPRESSIONS  ? 1. Left ventricular ejection fraction, by estimation, is 55 to 60%. The  ?left ventricle has normal function. The left ventricle has no regional  ?wall motion abnormalities. The left ventricular internal cavity size was  ?mildly dilated. There is mild left  ?ventricular hypertrophy. Left ventricular diastolic function could not be  ?evaluated.  ? 2. Right ventricular systolic function is mildly reduced. The right  ?ventricular size is mildly enlarged. There is mildly elevated pulmonary  ?artery systolic pressure.  ? 3. The mitral valve is normal in structure. Mild mitral valve  ?regurgitation. No evidence of mitral stenosis.  ? 4. The aortic valve is tricuspid. Aortic valve regurgitation is trivial.  ?Aortic valve sclerosis is present, with no evidence of aortic valve  ?stenosis.  ? 5. Aortic dilatation noted. There is mild dilatation of the aortic root,  ?measuring 41 mm. There is mild dilatation of the ascending aorta,  ?measuring 41 mm.  ? 6. The inferior vena cava is dilated in size with >50% respiratory  ?variability, suggesting right atrial pressure of 8 mmHg.  ? ? ?EKG:  EKG is *** ordered today.  The ekg ordered today demonstrates *** ? ?Recent Labs: ?07/30/2021: TSH 2.14 ?10/10/2021: B Natriuretic Peptide 428.4 ?10/13/2021: Magnesium 2.1 ?10/14/2021: Hemoglobin 11.8; Platelets 396 ?10/15/2021: ALT 24; BUN 24; Creatinine, Ser 1.03; Potassium 3.8; Sodium 133  ?Recent Lipid Panel ?   ?Component Value Date/Time  ? CHOL 160 08/29/2021 1352  ? CHOL 225 (H) 06/21/2014 1146  ? TRIG 149.0 08/29/2021 1352  ? TRIG 169 (H) 06/21/2014 1146  ? HDL 42.30 08/29/2021  1352  ? HDL 48 06/21/2014 1146  ? CHOLHDL 4 08/29/2021 1352  ? VLDL 29.8 08/29/2021 1352  ? [place] 88 08/29/2021 1352  ? [place] 158 (H) 03/31/2020 0845  ? LDLCALC 143 (H) 06/21/2014 1146  ? LDLDIRECT 153.0 05/29/2021 1109  ? ? ? ?Risk Assessment/Calculations:   ?{Does this patient have ATRIAL FIBRILLATION?[phone]} ? ?    ? ?Physical Exam:   ? ?VS:  There were no vitals taken for this visit.   ? ?Wt Readings from Last 3 Encounters:  ?11/02/21 255 lb (115.7 kg)  ?10/10/21 257 lb 15 oz (117 kg)  ?08/29/21 262 lb 6.4 oz (119 kg)  ?  ? ?GEN: ***  Well nourished, well developed in no acute distress ?HEENT: Normal ?NECK: No JVD; No carotid bruits ?LYMPHATICS: No lymphadenopathy ?CARDIAC: ***RRR, no murmurs, rubs, gallops ?RESPIRATORY:  Clear to auscultation without rales, wheezing or rhonchi  ?ABDOMEN: Soft, non-tender, non-distended ?MUSCULOSKELETAL:  No edema; No deformity  ?SKIN: Warm and dry ?NEUROLOGIC:  Alert and oriented x 3 ?PSYCHIATRIC:  Normal affect  ? ?ASSESSMENT:   ? ?No diagnosis found. ?PLAN:   ? ?In order of problems listed above: ? ?#Paroxysmal Afib: ?Newly Diagnosed. CHADs-vasc **. TTE with EF 55-60%, mildly reduced RV systolic function, mild PHTN, mild MR ?-Continue apixaban '5mg'$  BID ?-? Start metop ? ?#Aortic Root and Ascending Aorta Dilation: ?Measured 45m on TTE 09/2021. ?-Will need yearly monitoring ? ?#Chronic Diastolic HF: ?-Continue metop '50mg'$  XL daily ?-Continue losartan '100mg'$  daily ?-Continue lasix '40mg'$  prn ? ?#HTN: ?-Continue metop '50mg'$  XL daily ?-Continue losartan '100mg'$  daily ?   ? ?{Are you ordering a CV Procedure (e.g. stress test, cath, DCCV, TEE, etc)?   Press F2        :[id number]} ? ? ?Medication Adjustments/Labs and Tests Ordered: ?Current medicines are reviewed at length with the patient today.  Concerns regarding medicines are outlined above.  ?No orders of the defined types were placed in this encounter. ? ?No orders of the defined types were placed in this encounter. ? ? ?There  are no Patient Instructions on file for this visit.  ? ?Signed, ?HFreada Bergeron MD  ?11/05/2021 8:46 PM    ?[facility]?

## 2021-11-06 ENCOUNTER — Other Ambulatory Visit: Payer: Self-pay | Admitting: Family Medicine

## 2021-11-06 ENCOUNTER — Other Ambulatory Visit (HOSPITAL_COMMUNITY): Payer: Self-pay

## 2021-11-06 DIAGNOSIS — R600 Localized edema: Secondary | ICD-10-CM

## 2021-11-06 LAB — COMPREHENSIVE METABOLIC PANEL
ALT: 13 U/L (ref 0–35)
AST: 13 U/L (ref 0–37)
Albumin: 3.8 g/dL (ref 3.5–5.2)
Alkaline Phosphatase: 55 U/L (ref 39–117)
BUN: 11 mg/dL (ref 6–23)
CO2: 26 mEq/L (ref 19–32)
Calcium: 9.3 mg/dL (ref 8.4–10.5)
Chloride: 100 mEq/L (ref 96–112)
Creatinine, Ser: 1.12 mg/dL (ref 0.40–1.20)
GFR: 49.01 mL/min — ABNORMAL LOW (ref >60.00)
Glucose, Bld: 109 mg/dL — ABNORMAL HIGH (ref 70–99)
Potassium: 3.8 mEq/L (ref 3.5–5.1)
Sodium: 138 mEq/L (ref 135–145)
Total Bilirubin: 0.3 mg/dL (ref 0.2–1.2)
Total Protein: 6.5 g/dL (ref 6.0–8.3)

## 2021-11-06 MED ORDER — FUROSEMIDE 20 MG PO TABS
40.0000 mg | ORAL_TABLET | Freq: Two times a day (BID) | ORAL | 1 refills | Status: DC | PRN
  Filled 2021-11-06: qty 100, 17d supply, fill #0
  Filled 2021-11-25: qty 100, 17d supply, fill #1

## 2021-11-07 NOTE — H&P (View-Only) (Signed)
?Cardiology Office Note:   ? ?Date:  11/08/2021  ? ?ID:  [name], DOB 03/23/1951, MRN [id number] ? ?PCP:  Christine Macadam, [name] ?  ?[facility] Providers ?Cardiologist:  None { ? ? ?Referring [name]: Christine Macadam, [name]  ? ? ?History of Present Illness:   ? ?[name] is a 71 y.o. female with a hx of HTN, HLD, GERD, RA and GERD who was referred by Christine. Ethlyn [name] for further [name] of atrial fibrillation.  ? ?Patient was diagnosed with Afib on 09/2021 when she was hospitalized for COVID. She was asymptomatic at that time. Started on apixaban for [facility] and continued on metop '50mg'$  XL BID for rate control. ? ?Today, she is doing well and accompanied by her husband. She was infected with COVID and hospitalized 09/2021 where Afib was found. She denies having or being diagnosed with Afib prior to COVID. She is compliant in taking her medications. She does not feel when her heart rate is irregular. However, she endorses chest pain that feels like getting shocked occasionally at home. States her HR fluctuate at home but denies prolonged episodes of heart rate over 120 bpm. Since the infection, she also becomes dizzy with positional changes.  ? ?She is able to walk but cannot walk long distances and uses a wheelchair. She endorses chronic bilateral LE swelling and neuropathy. The swelling is worse around her ankles and can become warm. She has been on BID lasix chronically. ? ?No known OSA but reports snoring and daytime somnolence.  ? ?Past Medical History:  ?Diagnosis Date  ? Allergy   ? Cataract   ? BILATERAL-REMOVED 2 YEARS AGO  ? Erythromelalgia ([place])   ? followed by neuro [name] , mgd on gabapentin , dx several years ago   ? GERD (gastroesophageal reflux disease)   ? Helicobacter pylori gastritis 10/05/2018  ? Hx of colonic polyp - ssp 11/03/2014  ? Hypercalcemia   ? Hypertension   ? Left maxillary fracture ([place]) 07/17/2017  ? fell down my stairs 2 year ago , deneis any metal in place nor difficulty  with jax extension   ? Neuropathy   ? 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 Osteopenia 10/17/2011  ? DEXA 09/2007: -1.4 L fem; 10/2011: -1.2 L fem   ? Osteoporosis   ? PONV (postoperative nausea and vomiting)   ? Pseudogout of foot   ? Rheumatoid arthritis(714.0) dx 2010  ? Shingles   ? hx of   ? Tibial plateau fracture, right, closed, initial encounter 07/16/2017  ? ? ?Past Surgical History:  ?Procedure Laterality Date  ? BREAST BIOPSY  1972  ? Broken wrist  2010  ? CATARACT EXTRACTION  03/2012  ? left  ? COLONOSCOPY    ? COSMETIC SURGERY    ? FRACTURE SURGERY    ? INNER EAR SURGERY    ? busted ear drum  ? MAXIMUM ACCESS (MAS)POSTERIOR LUMBAR INTERBODY FUSION (PLIF) 1 LEVEL N/A 10/11/2016  ? Procedure: Lumbar one-Sacral one Maximum access posterior lumbar interbody fusion;  Surgeon: [name];  Location: [place];  Service: Neurosurgery;  Laterality: N/A;  ? ORIF WRIST FRACTURE Right 07/18/2017  ? Procedure: OPEN REDUCTION INTERNAL FIXATION (ORIF) WRIST FRACTURE;  Surgeon: [name];  Location: [place];  Service: Orthopedics;  Laterality: Right;  ? PARATHYROIDECTOMY Left 04/12/2019  ? Procedure: LEFT SUPERIOR PARATHYROIDECTOMY;  Surgeon: [name];  [facility];  Service: General;  Laterality: Left;  ? pneumonia  2007  ? ? ?  Current Medications: ?Current Meds  ?Medication Sig  ? Cholecalciferol (VITAMIN D) 125 MCG (5000 UT) CAPS Take 5,000 Units by mouth daily.  ? diclofenac sodium (VOLTAREN) 1 % GEL Apply 2 g topically 4 (four) times daily as needed (pain).  ? furosemide (LASIX) 20 MG tablet Take 2 - 3 tablets by mouth 2 times daily as needed for edema. (need appt for refills)  ? gabapentin (NEURONTIN) 100 MG capsule TAKE 3 CAPSULES IN THE MORNING AND 2 CAPSULES AT BEDTIME. OK TO TAKE EXTRA CAPSULE AT BEDTIME AS NEEDED  ? hydroxychloroquine (PLAQUENIL) 200 MG tablet Take 1 tablet by mouth with food or milk twice a day  ? hydroxypropyl methylcellulose / hypromellose (ISOPTO TEARS / GONIOVISC) 2.5 %  ophthalmic solution Place 1 drop into both eyes daily.  ? losartan (COZAAR) 100 MG tablet TAKE 1 TABLET BY MOUTH ONCE A DAY  ? metoprolol succinate (TOPROL-XL) 50 MG 24 hr tablet TAKE 1 TABLET BY MOUTH TWICE DAILY  ? metoprolol tartrate (LOPRESSOR) 25 MG tablet Take 1 tablet  by mouth 3 times daily as needed (for heart rate greater than 110 bpm).  ? potassium chloride SA (KLOR-CON M) 20 MEQ tablet Take 1 tablet (20 mEq total) by mouth 2 (two) times daily.  ? sulfaSALAzine (AZULFIDINE) 500 MG tablet Take 2 tablets by mouth twice a day  ? vitamin C (ASCORBIC ACID) 500 MG tablet Take 500 mg by mouth daily.  ? [DISCONTINUED] apixaban (ELIQUIS) 5 MG TABS tablet Take 1 tablet (5 mg total) by mouth 2 (two) times daily.  ?  ? ?Allergies:   Amlodipine besylate, Depakote [divalproex sodium], Nortriptyline, Dilaudid [hydromorphone hcl], and Tramadol  ? ?Social History  ? ?Socioeconomic History  ? Marital status: Married  ?  Spouse name: Not on file  ? Number of children: 3  ? Years of education: Not on file  ? Highest education level: Not on file  ?Occupational History  ? Occupation: Retired  ?Tobacco Use  ? Smoking status: Former  ?  Packs/day: 4.00  ?  Years: 4.00  ?  Pack years: 16.00  ?  Types: Cigarettes  ?  Start date: 27  ?  Quit date: 73  ?  Years since quitting: 38.2  ? Smokeless tobacco: Never  ?Vaping Use  ? Vaping Use: Never used  ?Substance and Sexual Activity  ? Alcohol use: No  ?  Alcohol/week: 0.0 standard drinks  ? Drug use: No  ? Sexual activity: Not on file  ?Other Topics Concern  ? Not on file  ?Social History Narrative  ? Artist -retired Building control surveyor  ? Married, lives with spouse, [name], he is IT support for [employer]  ? 3 sons  ? 2 caffeinated beverages a day  ? No regular exercise, diet is ok  ? Right handed  ? ?Social Determinants of Health  ? ?Financial Resource Strain: Not on file  ?Food Insecurity: Not on file  ?Transportation Needs: Not on file  ?Physical Activity: Not on  file  ?Stress: Not on file  ?Social Connections: Not on file  ?  ? ?Family History: ?The patient's family history includes Heart attack in her father; Hypertension in her brother, father, and sister; Hyperthyroidism in her sister; Hypothyroidism in her brother; Stroke in her mother. There is no history of Colon cancer, Esophageal cancer, Rectal cancer, or Stomach cancer. ? ?ROS:   ?Please see the history of present illness.    ?Review of Systems  ?Constitutional:  Negative for malaise/fatigue and  weight loss.  ?HENT:  Negative for congestion and sore throat.   ?Eyes:  Negative for blurred vision.  ?Respiratory:  Negative for cough and sputum production.   ?Cardiovascular:  Positive for chest pain, palpitations and leg swelling (bilateral (ankles)). Negative for orthopnea, claudication and PND.  ?Gastrointestinal:  Negative for heartburn and nausea.  ?Genitourinary:  Negative for dysuria and urgency.  ?Musculoskeletal:  Positive for joint pain (bilateral legs). Negative for myalgias.  ?Skin:  Negative for itching and rash.  ?Neurological:  Positive for dizziness (positional). Negative for headaches.  ?Endo/Heme/Allergies:  Does not bruise/bleed easily.  ?Psychiatric/Behavioral:  The patient is not nervous/anxious and does not have insomnia.   ?All other systems reviewed and are negative. ? ?EKGs/Labs/Other Studies Reviewed:   ? ?The following studies were reviewed today: ?TTE 10/14/21: ?IMPRESSIONS  ? 1. Left ventricular ejection fraction, by estimation, is 55 to 60%. The left ventricle has normal function. The left ventricle has no regional wall motion abnormalities. The left ventricular internal cavity size was mildly dilated. There is mild left ventricular hypertrophy. Left ventricular diastolic function could not be evaluated.  ? 2. Right ventricular systolic function is mildly reduced. The right ventricular size is mildly enlarged. There is mildly elevated pulmonary artery systolic pressure.  ? 3. The mitral  valve is normal in structure. Mild mitral valve  ?regurgitation. No evidence of mitral stenosis.  ? 4. The aortic valve is tricuspid. Aortic valve regurgitation is trivial. Aortic valve sclerosis is present,

## 2021-11-07 NOTE — Progress Notes (Signed)
?Cardiology Office Note:   ? ?Date:  11/08/2021  ? ?ID:  [name], DOB 03/05/1951, MRN [id number] ? ?PCP:  Caren Macadam, MD ?  ?[facility] Providers ?Cardiologist:  None { ? ? ?Referring MD: Caren Macadam, MD  ? ? ?History of Present Illness:   ? ?[name] is a 71 y.o. female with a hx of HTN, HLD, GERD, RA and GERD who was referred by Dr. Ethlyn Gallery for further evaluation of atrial fibrillation.  ? ?Patient was diagnosed with Afib on 09/2021 when she was hospitalized for COVID. She was asymptomatic at that time. Started on apixaban for [facility] and continued on metop '50mg'$  XL BID for rate control. ? ?Today, she is doing well and accompanied by her husband. She was infected with COVID and hospitalized 09/2021 where Afib was found. She denies having or being diagnosed with Afib prior to COVID. She is compliant in taking her medications. She does not feel when her heart rate is irregular. However, she endorses chest pain that feels like getting shocked occasionally at home. States her HR fluctuate at home but denies prolonged episodes of heart rate over 120 bpm. Since the infection, she also becomes dizzy with positional changes.  ? ?She is able to walk but cannot walk long distances and uses a wheelchair. She endorses chronic bilateral LE swelling and neuropathy. The swelling is worse around her ankles and can become warm. She has been on BID lasix chronically. ? ?No known OSA but reports snoring and daytime somnolence.  ? ?Past Medical History:  ?Diagnosis Date  ? Allergy   ? Cataract   ? BILATERAL-REMOVED 2 YEARS AGO  ? Erythromelalgia ([place])   ? followed by neuro Dr Posey Pronto , mgd on gabapentin , dx several years ago   ? GERD (gastroesophageal reflux disease)   ? Helicobacter pylori gastritis 10/05/2018  ? Hx of colonic polyp - ssp 11/03/2014  ? Hypercalcemia   ? Hypertension   ? Left maxillary fracture ([place]) 07/17/2017  ? fell down my stairs 2 year ago , deneis any metal in place nor difficulty  with jax extension   ? Neuropathy   ? 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 Osteopenia 10/17/2011  ? DEXA 09/2007: -1.4 L fem; 10/2011: -1.2 L fem   ? Osteoporosis   ? PONV (postoperative nausea and vomiting)   ? Pseudogout of foot   ? Rheumatoid arthritis(714.0) dx 2010  ? Shingles   ? hx of   ? Tibial plateau fracture, right, closed, initial encounter 07/16/2017  ? ? ?Past Surgical History:  ?Procedure Laterality Date  ? BREAST BIOPSY  1972  ? Broken wrist  2010  ? CATARACT EXTRACTION  03/2012  ? left  ? COLONOSCOPY    ? COSMETIC SURGERY    ? FRACTURE SURGERY    ? INNER EAR SURGERY    ? busted ear drum  ? MAXIMUM ACCESS (MAS)POSTERIOR LUMBAR INTERBODY FUSION (PLIF) 1 LEVEL N/A 10/11/2016  ? Procedure: Lumbar one-Sacral one Maximum access posterior lumbar interbody fusion;  Surgeon: Erline Levine, MD;  Location: [facility];  Service: Neurosurgery;  Laterality: N/A;  ? ORIF WRIST FRACTURE Right 07/18/2017  ? Procedure: OPEN REDUCTION INTERNAL FIXATION (ORIF) WRIST FRACTURE;  Surgeon: Renette Butters, MD;  Location: [facility];  Service: Orthopedics;  Laterality: Right;  ? PARATHYROIDECTOMY Left 04/12/2019  ? Procedure: LEFT SUPERIOR PARATHYROIDECTOMY;  Surgeon: [name], MD;  Location: [facility];  Service: General;  Laterality: Left;  ? pneumonia  2007  ? ? ?  Current Medications: ?Current Meds  ?Medication Sig  ? Cholecalciferol (VITAMIN D) 125 MCG (5000 UT) CAPS Take 5,000 Units by mouth daily.  ? diclofenac sodium (VOLTAREN) 1 % GEL Apply 2 g topically 4 (four) times daily as needed (pain).  ? furosemide (LASIX) 20 MG tablet Take 2 - 3 tablets by mouth 2 times daily as needed for edema. (need appt for refills)  ? gabapentin (NEURONTIN) 100 MG capsule TAKE 3 CAPSULES IN THE MORNING AND 2 CAPSULES AT BEDTIME. OK TO TAKE EXTRA CAPSULE AT BEDTIME AS NEEDED  ? hydroxychloroquine (PLAQUENIL) 200 MG tablet Take 1 tablet by mouth with food or milk twice a day  ? hydroxypropyl methylcellulose / hypromellose (ISOPTO TEARS / GONIOVISC) 2.5 %  ophthalmic solution Place 1 drop into both eyes daily.  ? losartan (COZAAR) 100 MG tablet TAKE 1 TABLET BY MOUTH ONCE A DAY  ? metoprolol succinate (TOPROL-XL) 50 MG 24 hr tablet TAKE 1 TABLET BY MOUTH TWICE DAILY  ? metoprolol tartrate (LOPRESSOR) 25 MG tablet Take 1 tablet  by mouth 3 times daily as needed (for heart rate greater than 110 bpm).  ? potassium chloride SA (KLOR-CON M) 20 MEQ tablet Take 1 tablet (20 mEq total) by mouth 2 (two) times daily.  ? sulfaSALAzine (AZULFIDINE) 500 MG tablet Take 2 tablets by mouth twice a day  ? vitamin C (ASCORBIC ACID) 500 MG tablet Take 500 mg by mouth daily.  ? [DISCONTINUED] apixaban (ELIQUIS) 5 MG TABS tablet Take 1 tablet (5 mg total) by mouth 2 (two) times daily.  ?  ? ?Allergies:   Amlodipine besylate, Depakote [divalproex sodium], Nortriptyline, Dilaudid [hydromorphone hcl], and Tramadol  ? ?Social History  ? ?Socioeconomic History  ? Marital status: Married  ?  Spouse name: Not on file  ? Number of children: 3  ? Years of education: Not on file  ? Highest education level: Not on file  ?Occupational History  ? Occupation: Retired  ?Tobacco Use  ? Smoking status: Former  ?  Packs/day: 4.00  ?  Years: 4.00  ?  Pack years: 16.00  ?  Types: Cigarettes  ?  Start date: 60  ?  Quit date: 34  ?  Years since quitting: 38.2  ? Smokeless tobacco: Never  ?Vaping Use  ? Vaping Use: Never used  ?Substance and Sexual Activity  ? Alcohol use: No  ?  Alcohol/week: 0.0 standard drinks  ? Drug use: No  ? Sexual activity: Not on file  ?Other Topics Concern  ? Not on file  ?Social History Narrative  ? Artist -retired Building control surveyor  ? Married, lives with spouse, [name], he is IT support for [employer]  ? 3 sons  ? 2 caffeinated beverages a day  ? No regular exercise, diet is ok  ? Right handed  ? ?Social Determinants of Health  ? ?Financial Resource Strain: Not on file  ?Food Insecurity: Not on file  ?Transportation Needs: Not on file  ?Physical Activity: Not on  file  ?Stress: Not on file  ?Social Connections: Not on file  ?  ? ?Family History: ?The patient's family history includes Heart attack in her father; Hypertension in her brother, father, and sister; Hyperthyroidism in her sister; Hypothyroidism in her brother; Stroke in her mother. There is no history of Colon cancer, Esophageal cancer, Rectal cancer, or Stomach cancer. ? ?ROS:   ?Please see the history of present illness.    ?Review of Systems  ?Constitutional:  Negative for malaise/fatigue and  weight loss.  ?HENT:  Negative for congestion and sore throat.   ?Eyes:  Negative for blurred vision.  ?Respiratory:  Negative for cough and sputum production.   ?Cardiovascular:  Positive for chest pain, palpitations and leg swelling (bilateral (ankles)). Negative for orthopnea, claudication and PND.  ?Gastrointestinal:  Negative for heartburn and nausea.  ?Genitourinary:  Negative for dysuria and urgency.  ?Musculoskeletal:  Positive for joint pain (bilateral legs). Negative for myalgias.  ?Skin:  Negative for itching and rash.  ?Neurological:  Positive for dizziness (positional). Negative for headaches.  ?Endo/Heme/Allergies:  Does not bruise/bleed easily.  ?Psychiatric/Behavioral:  The patient is not nervous/anxious and does not have insomnia.   ?All other systems reviewed and are negative. ? ?EKGs/Labs/Other Studies Reviewed:   ? ?The following studies were reviewed today: ?TTE 10/14/21: ?IMPRESSIONS  ? 1. Left ventricular ejection fraction, by estimation, is 55 to 60%. The left ventricle has normal function. The left ventricle has no regional wall motion abnormalities. The left ventricular internal cavity size was mildly dilated. There is mild left ventricular hypertrophy. Left ventricular diastolic function could not be evaluated.  ? 2. Right ventricular systolic function is mildly reduced. The right ventricular size is mildly enlarged. There is mildly elevated pulmonary artery systolic pressure.  ? 3. The mitral  valve is normal in structure. Mild mitral valve  ?regurgitation. No evidence of mitral stenosis.  ? 4. The aortic valve is tricuspid. Aortic valve regurgitation is trivial. Aortic valve sclerosis is present,

## 2021-11-08 ENCOUNTER — Other Ambulatory Visit (HOSPITAL_COMMUNITY): Payer: Self-pay

## 2021-11-08 ENCOUNTER — Other Ambulatory Visit: Payer: Self-pay

## 2021-11-08 ENCOUNTER — Ambulatory Visit: Payer: Medicare Other | Admitting: Cardiology

## 2021-11-08 ENCOUNTER — Encounter: Payer: Self-pay | Admitting: Cardiology

## 2021-11-08 VITALS — BP 128/78 | HR 110 | Ht 65.0 in | Wt 251.6 lb

## 2021-11-08 DIAGNOSIS — R0683 Snoring: Secondary | ICD-10-CM | POA: Diagnosis not present

## 2021-11-08 DIAGNOSIS — I4819 Other persistent atrial fibrillation: Secondary | ICD-10-CM

## 2021-11-08 DIAGNOSIS — I10 Essential (primary) hypertension: Secondary | ICD-10-CM

## 2021-11-08 DIAGNOSIS — I7121 Aneurysm of the ascending aorta, without rupture: Secondary | ICD-10-CM | POA: Diagnosis not present

## 2021-11-08 DIAGNOSIS — R400 Somnolence: Secondary | ICD-10-CM

## 2021-11-08 DIAGNOSIS — Z01812 Encounter for preprocedural laboratory examination: Secondary | ICD-10-CM | POA: Diagnosis not present

## 2021-11-08 DIAGNOSIS — I5032 Chronic diastolic (congestive) heart failure: Secondary | ICD-10-CM

## 2021-11-08 MED ORDER — APIXABAN 5 MG PO TABS
5.0000 mg | ORAL_TABLET | Freq: Two times a day (BID) | ORAL | 4 refills | Status: DC
  Filled 2021-11-08: qty 60, 30d supply, fill #0
  Filled 2021-12-10: qty 60, 30d supply, fill #1
  Filled 2022-01-08: qty 60, 30d supply, fill #2
  Filled 2022-02-11: qty 60, 30d supply, fill #3
  Filled 2022-03-06: qty 60, 30d supply, fill #4

## 2021-11-08 MED ORDER — METOPROLOL TARTRATE 25 MG PO TABS
25.0000 mg | ORAL_TABLET | Freq: Three times a day (TID) | ORAL | 3 refills | Status: DC | PRN
  Filled 2021-11-08: qty 90, 30d supply, fill #0

## 2021-11-08 NOTE — Patient Instructions (Signed)
Medication Instructions:  ? ?START TAKING METOPROLOL TARTRATE 25 MG BY MOUTH THREE TIMES DAILY AS NEEDED FOR HEART RATE GREATER THAN 110 BPM ? ?*If you need a refill on your cardiac medications before your next appointment, please call your pharmacy* ? ? ?Lab Work: ? ?ON Monday 11/19/21 HERE IN THE OFFICE--PRE-PROCEDURE LABS--BMET AND CBC W DIFF ? ?If you have labs (blood work) drawn today and your tests are completely normal, you will receive your results only by: ?MyChart Message (if you have MyChart) OR ?A paper copy in the mail ?If you have any lab test that is abnormal or we need to change your treatment, we will call you to review the results. ? ? ?Testing/Procedures: ? ?Your physician has recommended that you have a sleep study. This test records several body functions during sleep, including: brain activity, eye movement, oxygen and carbon dioxide blood levels, heart rate and rhythm, breathing rate and rhythm, the flow of air through your mouth and nose, snoring, body muscle movements, and chest and belly movement.  YOU WILL GET A CALL BACK FROM OUR SLEEP STUDY COORDINATOR NINA JONES TO SCHEDULE THIS TEST ? ? ?CARDIOVERSION ?You are scheduled for a Cardioversion on Thursday 11/22/21 with Dr. Audie Box AT 9:30 AM.  Please arrive at the [facility] (Main Entrance A) at [facility]: [place], [place] [address] at 9:30 AM.  (1 hour prior to procedure unless lab work is needed; if lab work is needed arrive 1.5 hours ahead) ? ?DIET: Nothing to eat or drink after midnight except a sip of water with medications (see medication instructions below) ? ?FYI: For your safety, and to allow [place] to monitor your vital signs accurately during the surgery/procedure we request that   ?if you have artificial nails, gel coating, SNS etc. Please have those removed prior to your surgery/procedure. Not having the nail coverings /polish removed may result in cancellation or delay of your  surgery/procedure. ? ? ?Medication Instructions: ?Hold YOUR LASIX THE MORNING OF THIS PROCEDURE ? ?Continue your anticoagulant: ELIQUIS You will need to continue your anticoagulant after your procedure until you are told by your Provider that it is safe to stop ? ? ?Labs: ? ?Come to: ON Monday 11/19/21  BMET AND CBC W DIFF ?(Lab option #1) Come to the lab at [facility] between the hours of 8:00 am and 4:30 pm. You do not have to be fasting. ? ? ?You must have a responsible person to drive you home and stay in the waiting area during your procedure. Failure to do so could result in cancellation. ? ?Interior and spatial designer cards. ? ?*Special Note: Every effort is made to have your procedure done on time. Occasionally there are emergencies that occur at the hospital that may cause delays. Please be patient if a delay does occur.  ? ? ? ?Follow-Up: ?At [facility], you and your health needs are our priority.  As part of our continuing mission to provide you with exceptional heart care, we have created designated Provider Care Teams.  These Care Teams include your primary Cardiologist (physician) and Advanced Practice Providers (APPs -  Physician Assistants and Nurse Practitioners) who all work together to provide you with the care you need, when you need it. ? ?We recommend signing up for the patient portal called "MyChart".  Sign up information is provided on this After Visit Summary.  MyChart is used to connect with patients for Virtual Visits (Telemedicine).  Patients are able to view  lab/test results, encounter notes, upcoming appointments, etc.  Non-urgent messages can be sent to your provider as well.   ?To learn more about what you can do with MyChart, go to [email or web address].   ? ?Your next appointment:   ?6 month(s) ? ?The format for your next appointment:   ?In Person ? ?Provider:   ?DR. PEMBERTON  ? ?

## 2021-11-11 ENCOUNTER — Other Ambulatory Visit: Payer: Self-pay | Admitting: Family Medicine

## 2021-11-12 ENCOUNTER — Other Ambulatory Visit (HOSPITAL_COMMUNITY): Payer: Self-pay

## 2021-11-12 DIAGNOSIS — J9601 Acute respiratory failure with hypoxia: Secondary | ICD-10-CM | POA: Diagnosis not present

## 2021-11-12 MED ORDER — METOPROLOL SUCCINATE ER 50 MG PO TB24
ORAL_TABLET | Freq: Two times a day (BID) | ORAL | 0 refills | Status: DC
  Filled 2021-11-12: qty 180, 90d supply, fill #0

## 2021-11-14 ENCOUNTER — Encounter (HOSPITAL_COMMUNITY): Payer: Self-pay | Admitting: Cardiovascular Disease

## 2021-11-14 ENCOUNTER — Telehealth: Payer: Self-pay | Admitting: Family Medicine

## 2021-11-14 ENCOUNTER — Other Ambulatory Visit (HOSPITAL_COMMUNITY): Payer: Self-pay

## 2021-11-14 NOTE — Telephone Encounter (Signed)
If she is not getting fluid release/improvement from '60mg'$  (3 of the '20mg'$  tablets) twice daily, she can increase to 4 tablets ('80mg'$ ) twice daily to see if this helps. I would suggest limiting this to a few days. If that is not helping she needs to be seen. ?

## 2021-11-14 NOTE — Telephone Encounter (Signed)
Patient informed of the message below.  Patient states she has been taking 3 tablets in the morning and 2 at bedtime and will begin taking as directed below.  States she noticed blood in the urine since yesterday and questioned if she should contact her cardiologist as she was told Eliquis could cause this as she has a procedure scheduled for next week and the kidney doctor changed from the previous blood pressure medication that was causing blood in the urine?  Patient agreed to contact the cardiologist first for advice. ?

## 2021-11-14 NOTE — Telephone Encounter (Signed)
Patient wants to know if she could increase fluid pills because she experiencing swelling in both ankles.  ? ?Please advise. ?

## 2021-11-15 ENCOUNTER — Telehealth: Payer: Self-pay | Admitting: Cardiology

## 2021-11-15 NOTE — Telephone Encounter (Signed)
Pt c/o swelling: STAT is pt has developed SOB within 24 hours ? ?If swelling, where is the swelling located? ankles ? ?How much weight have you gained and in what time span? Pt has not weighed ? ?Have you gained 3 pounds in a day or 5 pounds in a week? No ? ?Do you have a log of your daily weights (if so, list)? No ? ?Are you currently taking a fluid pill? Yes  ? ?Are you currently SOB? No ? ?Have you traveled recently? No ? ? ?Pt states that her PCP advised that she can take 3 additional fluid pills at night due to her swelling. She is wanting to know is there anything that she can take for the pain in her ankles.  ?

## 2021-11-15 NOTE — Telephone Encounter (Signed)
Spoke with pt and she states that she contacted  PCP yesterday about swelling in feet and ankles and they advised ok to take extra Furosemide for a few days.  She took an additional  dose last night.  Woke up this morning and swelling was a little better but she has significant pain in ankles.  She tried Tylenol with no improvement.  Asked if she has tried her Voltaren Gel.  She has not.  She states she will try using Voltaren Gel and does have PRN dosing for her Gabapentin, so may try that as well.  Advised if no improvement, contact PCP and let them know as this does not sound cardiac related.  Pt verbalized understanding.  ?

## 2021-11-16 ENCOUNTER — Telehealth (INDEPENDENT_AMBULATORY_CARE_PROVIDER_SITE_OTHER): Payer: Medicare Other | Admitting: Family Medicine

## 2021-11-16 ENCOUNTER — Encounter: Payer: Self-pay | Admitting: Family Medicine

## 2021-11-16 VITALS — BP 140/80 | Wt 251.0 lb

## 2021-11-16 DIAGNOSIS — E785 Hyperlipidemia, unspecified: Secondary | ICD-10-CM | POA: Diagnosis not present

## 2021-11-16 DIAGNOSIS — Z86010 Personal history of colonic polyps: Secondary | ICD-10-CM | POA: Diagnosis not present

## 2021-11-16 DIAGNOSIS — I4891 Unspecified atrial fibrillation: Secondary | ICD-10-CM

## 2021-11-16 DIAGNOSIS — M5417 Radiculopathy, lumbosacral region: Secondary | ICD-10-CM | POA: Diagnosis not present

## 2021-11-16 DIAGNOSIS — M069 Rheumatoid arthritis, unspecified: Secondary | ICD-10-CM

## 2021-11-16 DIAGNOSIS — Z792 Long term (current) use of antibiotics: Secondary | ICD-10-CM | POA: Diagnosis not present

## 2021-11-16 DIAGNOSIS — G629 Polyneuropathy, unspecified: Secondary | ICD-10-CM | POA: Diagnosis not present

## 2021-11-16 DIAGNOSIS — J9601 Acute respiratory failure with hypoxia: Secondary | ICD-10-CM | POA: Diagnosis not present

## 2021-11-16 DIAGNOSIS — K219 Gastro-esophageal reflux disease without esophagitis: Secondary | ICD-10-CM | POA: Diagnosis not present

## 2021-11-16 DIAGNOSIS — I7381 Erythromelalgia: Secondary | ICD-10-CM | POA: Diagnosis not present

## 2021-11-16 DIAGNOSIS — M48062 Spinal stenosis, lumbar region with neurogenic claudication: Secondary | ICD-10-CM | POA: Diagnosis not present

## 2021-11-16 DIAGNOSIS — R600 Localized edema: Secondary | ICD-10-CM

## 2021-11-16 DIAGNOSIS — M810 Age-related osteoporosis without current pathological fracture: Secondary | ICD-10-CM | POA: Diagnosis not present

## 2021-11-16 DIAGNOSIS — I10 Essential (primary) hypertension: Secondary | ICD-10-CM | POA: Diagnosis not present

## 2021-11-16 DIAGNOSIS — Z9981 Dependence on supplemental oxygen: Secondary | ICD-10-CM | POA: Diagnosis not present

## 2021-11-16 DIAGNOSIS — Z87891 Personal history of nicotine dependence: Secondary | ICD-10-CM | POA: Diagnosis not present

## 2021-11-16 DIAGNOSIS — E213 Hyperparathyroidism, unspecified: Secondary | ICD-10-CM | POA: Diagnosis not present

## 2021-11-16 DIAGNOSIS — E876 Hypokalemia: Secondary | ICD-10-CM | POA: Diagnosis not present

## 2021-11-16 DIAGNOSIS — U071 COVID-19: Secondary | ICD-10-CM | POA: Diagnosis not present

## 2021-11-16 DIAGNOSIS — D509 Iron deficiency anemia, unspecified: Secondary | ICD-10-CM | POA: Diagnosis not present

## 2021-11-16 DIAGNOSIS — Z7951 Long term (current) use of inhaled steroids: Secondary | ICD-10-CM | POA: Diagnosis not present

## 2021-11-16 DIAGNOSIS — M19049 Primary osteoarthritis, unspecified hand: Secondary | ICD-10-CM | POA: Diagnosis not present

## 2021-11-16 DIAGNOSIS — Z7901 Long term (current) use of anticoagulants: Secondary | ICD-10-CM | POA: Diagnosis not present

## 2021-11-16 DIAGNOSIS — E871 Hypo-osmolality and hyponatremia: Secondary | ICD-10-CM | POA: Diagnosis not present

## 2021-11-16 NOTE — Progress Notes (Signed)
Virtual Visit via Video Note ? ?I connected with [name] on 11/16/21 at  4:00 PM EDT by a video enabled telemedicine application and verified that I am speaking with the correct person using two identifiers. ? Location patient: home ?Location provider: ?[place]  ?[address]  ?[place], [place] [address] ?Persons participating in the virtual visit: patient, provider ? ?I discussed the limitations of evaluation and management by telemedicine and the availability of in person appointments. The patient expressed understanding and agreed to proceed. ? ? ?[name] ?DOB: [birth year] ?Encounter date: 11/16/2021 ? ?This is a 71 y.o. female who presents with ?Chief Complaint  ?Patient presents with  ? Follow-up  ? ? ?History of present illness: ?Lower extremity edema: swelling in ankles got to where she couldn't put pressure on feet at all. She did extra fluid pill last night and today which helped some. Ankles got to point of cracking open. Had just been taking the 3 lasix in morning and 2 at night. Does get fluid out with the 3 tablets; sometimes more than others. Does have better diuresis with 3.  ? ?She is scheduled for cardioversion on 11/22/2021.  She was seen by cardiology, Dr. Johney Frame on 3/23.  She was diagnosed with atrial fibrillation when hospitalized in February for post COVID symptoms.  Has recommended for her to have a sleep study. ? ?Her breathing feels ok. Oxygen does drop if up and moving around. She has noted it drop into 80's with activity. Will wear oxygen just if dropping, but it quickly comes back up.  ? ?Follows with endocrinology for osteoporosis ? ?Follows with rheumatology for rheumatoid arthritis. Hips and ankles stay sore.  ? ?Hyperlipidemia: She was put on Crestor 05/2021.  I allowed her to take a 2-week break from this medication in January because she was worried that it may be contributing to increased back pain.  It had however improved her cholesterol numbers  significantly. Does feel like pain was much worse on the crestor. Feels better off of this.  ? ? ?Allergies  ?Allergen Reactions  ? Amlodipine Besylate Other (See Comments)  ?  Tremors  ? Depakote [Divalproex Sodium] Swelling  ? Nortriptyline Other (See Comments)  ?  tremors  ? Crestor [Rosuvastatin]   ?  Muscle aches, pains. Resolved with stopping medication.   ? Dilaudid [Hydromorphone Hcl]   ?  Headache, muscle tightness  ? Tramadol   ?  Felt "stoned"  ? ?Current Meds  ?Medication Sig  ? apixaban (ELIQUIS) 5 MG TABS tablet Take 1 tablet by mouth 2  times daily.  ? Cholecalciferol (VITAMIN D) 125 MCG (5000 UT) CAPS Take 5,000 Units by mouth daily.  ? diclofenac sodium (VOLTAREN) 1 % GEL Apply 2 g topically 4 (four) times daily as needed (pain).  ? fexofenadine (ALLEGRA) 180 MG tablet Take 180 mg by mouth daily as needed (allergies).  ? furosemide (LASIX) 20 MG tablet Take 2 - 3 tablets by mouth 2 times daily as needed for edema. (need appt for refills) (Patient taking differently: Take 60 mg by mouth 2 (two) times daily as needed for edema.)  ? gabapentin (NEURONTIN) 100 MG capsule TAKE 3 CAPSULES IN THE MORNING AND 2 CAPSULES AT BEDTIME. OK TO TAKE EXTRA CAPSULE AT BEDTIME AS NEEDED  ? hydroxychloroquine (PLAQUENIL) 200 MG tablet Take 1 tablet by mouth with food or milk twice a day  ? hydroxypropyl methylcellulose / hypromellose (ISOPTO TEARS / GONIOVISC) 2.5 % ophthalmic solution Place 1  drop into both eyes daily.  ? losartan (COZAAR) 100 MG tablet TAKE 1 TABLET BY MOUTH ONCE A DAY  ? metoprolol succinate (TOPROL-XL) 50 MG 24 hr tablet TAKE 1 TABLET BY MOUTH TWICE DAILY (need appt for refills)  ? metoprolol tartrate (LOPRESSOR) 25 MG tablet Take 1 tablet  by mouth 3 times daily as needed (for heart rate greater than 110 bpm).  ? potassium chloride SA (KLOR-CON M) 20 MEQ tablet Take 1 tablet (20 mEq total) by mouth 2 (two) times daily.  ? sulfaSALAzine (AZULFIDINE) 500 MG tablet Take 2 tablets by mouth twice a  day  ? vitamin C (ASCORBIC ACID) 500 MG tablet Take 500 mg by mouth daily.  ? ? ?Review of Systems  ?Constitutional:  Positive for fatigue. Negative for chills and fever.  ?Respiratory:  Positive for shortness of breath (improved, but notes with exertion). Negative for cough, chest tightness and wheezing.   ?Cardiovascular:  Negative for chest pain, palpitations and leg swelling.  ? ?Objective: ? ?BP 140/80 Comment: checked by home heatlh PT vs nurse today at her home--jaf  Wt 251 lb (113.9 kg)   SpO2 98%   BMI 41.77 kg/m?   Weight: 251 lb (113.9 kg)  ? ?BP Readings from Last 3 Encounters:  ?11/16/21 140/80  ?11/08/21 128/78  ?10/24/21 (!) 142/80  ? ?Wt Readings from Last 3 Encounters:  ?11/16/21 251 lb (113.9 kg)  ?11/08/21 251 lb 9.6 oz (114.1 kg)  ?11/02/21 255 lb (115.7 kg)  ? ? ?EXAM: ? ?GENERAL: alert, oriented, appears well and in no acute distress ? ?HEENT: atraumatic, conjunctiva clear, no obvious abnormalities on inspection of external nose and ears ? ?NECK: normal movements of the head and neck ? ?LUNGS: on inspection no signs of respiratory distress, breathing rate appears normal, no obvious gross SOB, gasping or wheezing ? ?CV: no obvious cyanosis ? ?MS: moves all visible extremities without noticeable abnormality ? ?PSYCH/NEURO: pleasant and cooperative, no obvious depression or anxiety, speech and thought processing grossly intact ? ? ?Assessment/Plan ? ?1. Hypertension, unspecified type ?Encouraged to monitor home blood pressure.  Currently on losartan 100 mg, Toprol 50 mg. ? ?2. Rheumatoid arthritis, involving unspecified site, unspecified whether rheumatoid factor present ([place]) ?Continue to follow with rheumatology.  She does have ongoing chronic pain secondary to arthritis.  Does have improvement of pain with Plaquenil. ? ?3. Osteoporosis without current pathological fracture, unspecified osteoporosis type ?She is due for DEXA.  Encouraged her to schedule this with [facility] radiology.  Is  been ordered in the past, but due to multiple medical issues, this has fallen through the cracks. ? ?4. Hyperlipidemia, unspecified hyperlipidemia type ?She has significant increase in pain with statin medication.  Encouraged her to talk with cardiology about alternative options for cholesterol treatment and control. ? ?5. New onset atrial fibrillation ([place]) ?Patient is getting cardioversion. Looks like cardiology wanted her to have sleep study, but I don't see active referral. Will place this today. ?- Ambulatory referral to Sleep Studies ? ?6. Lower extremity edema ?Okay to increase Lasix to 60 mg twice daily or even 80 mg twice daily if needed to help with lower extremity edema.  We did discuss that lower extremity edema may improve after cardioversion. ? ?Return in about 4 months (around 03/18/2022) for Chronic condition visit/establish care. ? ? ?I discussed the assessment and treatment plan with the patient. The patient was provided an opportunity to ask questions and all were answered. The patient agreed with the plan and demonstrated an  understanding of the instructions. ?  ?The patient was advised to call back or seek an in-person evaluation if the symptoms worsen or if the condition fails to improve as anticipated. ? ?I provided 30 minutes of face-to-face time during this encounter. ? ? ?[name], MD  ? ? ?

## 2021-11-16 NOTE — Patient Instructions (Addendum)
*  call [facility] to set up mammogram and bone density study.  ? ?*talk with cardiology about cholesterol control since you did not tolerate the crestor well.  ? ?*you will get a phone call from sleep specialist (this is typically done through neurology or pulmonology) in order to complete a sleep study.  This is an important evaluation since sleep apnea can affect everything from abnormal heart rhythms to fatigue to uncontrolled blood pressure. ? ?*Okay to increase Lasix to 60 mg (3 tablets) or 80 mg (4 tablets) twice daily as needed to help with lower extremity swelling control.  If lower extremity swelling is not improved in a couple of weeks and with this increased dose, please let me know. ?

## 2021-11-19 ENCOUNTER — Other Ambulatory Visit: Payer: Medicare Other | Admitting: *Deleted

## 2021-11-19 DIAGNOSIS — R0683 Snoring: Secondary | ICD-10-CM

## 2021-11-19 DIAGNOSIS — R400 Somnolence: Secondary | ICD-10-CM

## 2021-11-19 DIAGNOSIS — I4819 Other persistent atrial fibrillation: Secondary | ICD-10-CM

## 2021-11-19 DIAGNOSIS — Z01812 Encounter for preprocedural laboratory examination: Secondary | ICD-10-CM | POA: Diagnosis not present

## 2021-11-19 LAB — CBC WITH DIFFERENTIAL/PLATELET
Basophils Absolute: 0 10*3/uL (ref 0.0–0.2)
Basos: 0 %
EOS (ABSOLUTE): 0 10*3/uL (ref 0.0–0.4)
Eos: 1 %
Hematocrit: 36.2 % (ref 34.0–46.6)
Hemoglobin: 11.8 g/dL (ref 11.1–15.9)
Lymphocytes Absolute: 1.4 10*3/uL (ref 0.7–3.1)
Lymphs: 30 %
MCH: 28.3 pg (ref 26.6–33.0)
MCHC: 32.6 g/dL (ref 31.5–35.7)
MCV: 87 fL (ref 79–97)
Monocytes Absolute: 0.6 10*3/uL (ref 0.1–0.9)
Monocytes: 13 %
Neutrophils Absolute: 2.5 10*3/uL (ref 1.4–7.0)
Neutrophils: 56 %
Platelets: 215 10*3/uL (ref 150–450)
RBC: 4.17 x10E6/uL (ref 3.77–5.28)
RDW: 16.1 % — ABNORMAL HIGH (ref 11.7–15.4)
WBC: 4.5 10*3/uL (ref 3.4–10.8)

## 2021-11-19 LAB — BASIC METABOLIC PANEL
BUN/Creatinine Ratio: 14 (ref 12–28)
BUN: 16 mg/dL (ref 8–27)
CO2: 30 mmol/L — ABNORMAL HIGH (ref 20–29)
Calcium: 9.4 mg/dL (ref 8.7–10.3)
Chloride: 99 mmol/L (ref 96–106)
Creatinine, Ser: 1.11 mg/dL — ABNORMAL HIGH (ref 0.57–1.00)
Glucose: 133 mg/dL — ABNORMAL HIGH (ref 70–99)
Potassium: 3.9 mmol/L (ref 3.5–5.2)
Sodium: 137 mmol/L (ref 134–144)
eGFR: 53 mL/min/{1.73_m2} — ABNORMAL LOW (ref >59)

## 2021-11-20 ENCOUNTER — Telehealth: Payer: Self-pay | Admitting: *Deleted

## 2021-11-20 NOTE — Telephone Encounter (Signed)
-----   Message from Caren Macadam, MD sent at 11/20/2021  8:50 AM EDT ----- ?Although I didn't see addendum from radiology, the questionable nodule that was there on a portable chest xray when she first got to hospital is clearly not apparent on more recent study. I do not feel she will need further eval for this. ?

## 2021-11-20 NOTE — Telephone Encounter (Signed)
Patient informed of the message below.

## 2021-11-21 ENCOUNTER — Other Ambulatory Visit (HOSPITAL_COMMUNITY): Payer: Self-pay

## 2021-11-22 ENCOUNTER — Encounter (HOSPITAL_COMMUNITY)
Admission: RE | Disposition: A | Discharge status: Home / Self Care | Payer: Self-pay | Source: Home / Self Care | Attending: Cardiovascular Disease

## 2021-11-22 ENCOUNTER — Encounter (HOSPITAL_COMMUNITY): Payer: Self-pay | Admitting: Cardiovascular Disease

## 2021-11-22 ENCOUNTER — Ambulatory Visit (HOSPITAL_COMMUNITY): Payer: Medicare Other | Admitting: Certified Registered Nurse Anesthetist

## 2021-11-22 ENCOUNTER — Telehealth: Payer: Self-pay | Admitting: *Deleted

## 2021-11-22 ENCOUNTER — Ambulatory Visit (HOSPITAL_BASED_OUTPATIENT_CLINIC_OR_DEPARTMENT_OTHER): Payer: Medicare Other | Admitting: Certified Registered Nurse Anesthetist

## 2021-11-22 ENCOUNTER — Ambulatory Visit (HOSPITAL_COMMUNITY)
Admission: RE | Admit: 2021-11-22 | Discharge: 2021-11-22 | Disposition: A | Discharge status: Home / Self Care | Payer: Medicare Other | Attending: Cardiovascular Disease | Admitting: Cardiovascular Disease

## 2021-11-22 ENCOUNTER — Telehealth: Payer: Self-pay | Admitting: Cardiology

## 2021-11-22 DIAGNOSIS — M069 Rheumatoid arthritis, unspecified: Secondary | ICD-10-CM | POA: Insufficient documentation

## 2021-11-22 DIAGNOSIS — I5032 Chronic diastolic (congestive) heart failure: Secondary | ICD-10-CM | POA: Insufficient documentation

## 2021-11-22 DIAGNOSIS — I4891 Unspecified atrial fibrillation: Secondary | ICD-10-CM

## 2021-11-22 DIAGNOSIS — Z87891 Personal history of nicotine dependence: Secondary | ICD-10-CM | POA: Insufficient documentation

## 2021-11-22 DIAGNOSIS — I110 Hypertensive heart disease with heart failure: Secondary | ICD-10-CM | POA: Insufficient documentation

## 2021-11-22 DIAGNOSIS — I7121 Aneurysm of the ascending aorta, without rupture: Secondary | ICD-10-CM | POA: Insufficient documentation

## 2021-11-22 DIAGNOSIS — I4819 Other persistent atrial fibrillation: Secondary | ICD-10-CM | POA: Insufficient documentation

## 2021-11-22 DIAGNOSIS — K219 Gastro-esophageal reflux disease without esophagitis: Secondary | ICD-10-CM | POA: Insufficient documentation

## 2021-11-22 DIAGNOSIS — Z993 Dependence on wheelchair: Secondary | ICD-10-CM | POA: Insufficient documentation

## 2021-11-22 DIAGNOSIS — Z8616 Personal history of COVID-19: Secondary | ICD-10-CM | POA: Insufficient documentation

## 2021-11-22 DIAGNOSIS — F419 Anxiety disorder, unspecified: Secondary | ICD-10-CM

## 2021-11-22 DIAGNOSIS — I739 Peripheral vascular disease, unspecified: Secondary | ICD-10-CM | POA: Diagnosis not present

## 2021-11-22 DIAGNOSIS — I10 Essential (primary) hypertension: Secondary | ICD-10-CM

## 2021-11-22 HISTORY — PX: CARDIOVERSION: SHX1299

## 2021-11-22 SURGERY — CARDIOVERSION
Anesthesia: General

## 2021-11-22 MED ORDER — LIDOCAINE 2% (20 MG/ML) 5 ML SYRINGE
INTRAMUSCULAR | Status: DC | PRN
  Administered 2021-11-22: 100 mg via INTRAVENOUS

## 2021-11-22 MED ORDER — SODIUM CHLORIDE 0.9 % IV SOLN: INTRAVENOUS | Status: DC

## 2021-11-22 MED ORDER — PROPOFOL 10 MG/ML IV BOLUS
INTRAVENOUS | Status: DC | PRN
  Administered 2021-11-22: 80 mg via INTRAVENOUS
  Administered 2021-11-22: 20 mg via INTRAVENOUS

## 2021-11-22 NOTE — Anesthesia Postprocedure Evaluation (Signed)
Anesthesia Post Note ? ?Patient: CHRISANNA MISHRA ? ?Procedure(s) Performed: CARDIOVERSION ? ?  ? ?Patient location during evaluation: PACU ?Anesthesia Type: General ?Level of consciousness: awake and alert ?Pain management: pain level controlled ?Vital Signs Assessment: post-procedure vital signs reviewed and stable ?Respiratory status: spontaneous breathing, nonlabored ventilation and respiratory function stable ?Cardiovascular status: blood pressure returned to baseline and stable ?Postop Assessment: no apparent nausea or vomiting ?Anesthetic complications: no ? ? ?No notable events documented. ? ?Last Vitals:  ?Vitals:  ? 11/22/21 1020 11/22/21 1030  ?BP: (!) 147/80 (!) 134/58  ?Pulse: (!) 112 (!) 112  ?Resp: 17 20  ?Temp:    ?SpO2: 95% 94%  ?  ?Last Pain:  ?Vitals:  ? 11/22/21 1030  ?TempSrc:   ?PainSc: 0-No pain  ? ? ?  ?  ?  ?  ?  ?  ? ?Lynda Rainwater ? ? ? ? ?

## 2021-11-22 NOTE — Telephone Encounter (Signed)
Let's do amiodarone '400mg'$  BID x7 days, followed by '200mg'$  BID for 7 days and then '200mg'$  daily thereafter and have her see Afib clinic. Hopefully can retry with amiodarone on board.  ?

## 2021-11-22 NOTE — Transfer of Care (Signed)
Immediate Anesthesia Transfer of Care Note ? ?Patient: [name] ? ?Procedure(s) Performed: CARDIOVERSION ? ?Patient Location: PACU and Endoscopy Unit ? ?Anesthesia Type:General ? ?Level of Consciousness: drowsy, patient cooperative and responds to stimulation ? ?Airway & Oxygen Therapy: Patient Spontanous Breathing ? ?Post-op Assessment: Report given to RN and Post -op Vital signs reviewed and stable ? ?Post vital signs: Reviewed and stable ? ?Last Vitals:  ?Vitals Value Taken Time  ?BP    ?Temp    ?Pulse    ?Resp    ?SpO2    ? ? ?Last Pain:  ?Vitals:  ? 11/22/21 0854  ?TempSrc: Oral  ?PainSc: 0-No pain  ?   ? ?  ? ?Complications: No notable events documented. ?

## 2021-11-22 NOTE — Telephone Encounter (Signed)
Totally fair. No amio and we can just have her seen in Afib clinic for now. ?

## 2021-11-22 NOTE — Telephone Encounter (Signed)
Patient said she had three unsuccessful Cardioversion's today. She is not sure what to do next  ?

## 2021-11-22 NOTE — Interval H&P Note (Signed)
History and Physical Interval Note: ? ?11/22/2021 ?8:56 AM ? ?AARTHI UYENO  has presented today for surgery, with the diagnosis of AFIB.  The various methods of treatment have been discussed with the patient and family. After consideration of risks, benefits and other options for treatment, the patient has consented to  Procedure(s): ?CARDIOVERSION (N/A) as a surgical intervention.  The patient's history has been reviewed, patient examined, no change in status, stable for surgery.  I have reviewed the patient's chart and labs.  Questions were answered to the patient's satisfaction.   ? ?NPO for DCCV. On eliquis. No missed doses >3 weeks.  ? ?Lake Bells T. Audie Box, MD, [facility] ?[address]  ?[address], [address] ?[place], [place] [address] ?[phone]  ?8:56 AM ? ?

## 2021-11-22 NOTE — Anesthesia Procedure Notes (Signed)
Procedure Name: General with mask airway ?Date/Time: 11/22/2021 9:54 AM ?Performed by: Janace Litten, CRNA ?Pre-anesthesia Checklist: Patient identified, Emergency Drugs available, Suction available and Patient being monitored ?Patient Re-evaluated:Patient Re-evaluated prior to induction ?Oxygen Delivery Method: Ambu bag ?Preoxygenation: Pre-oxygenation with 100% oxygen ?Induction Type: IV induction ? ? ? ? ?

## 2021-11-22 NOTE — CV Procedure (Signed)
? ?  DIRECT CURRENT CARDIOVERSION ? ?NAME:  [name]    ?MRN: [id number] ?DOB:  1951/03/26    ?ADMIT DATE: 11/22/2021 ? ?Indication:  Symptomatic atrial fibrillation ? ?Procedure Note:  The patient signed informed consent.  They have had had therapeutic anticoagulation with eliquis greater than 3 weeks.  Anesthesia was administered by Dr. Sabra Heck.  Adequate airway was maintained throughout and vital followed per protocol.  They were cardioverted x 3 with 200J of biphasic synchronized energy.  The patient did not convert to NSR despite 3 attempts.  There were no apparent complications.  The patient had normal neuro status and respiratory status post procedure with vitals stable as recorded elsewhere.   ? ?Follow up: They will continue on current medical therapy and follow up with cardiology as scheduled. ? ?L[place] Bells T. Audie Box, MD, [facility] ?[place]  ?[address], [address] ?[place], [place] [address] ?[phone]  ?10:01 AM ? ?

## 2021-11-22 NOTE — Anesthesia Preprocedure Evaluation (Signed)
Anesthesia Evaluation  ?Patient identified by MRN, date of birth, ID band ?Patient awake ? ? ? ?Reviewed: ?Allergy & Precautions, NPO status , Patient's Chart, lab work & pertinent test results ? ?History of Anesthesia Complications ?(+) PONV and history of anesthetic complications ? ?Airway ?Mallampati: I ? ?TM Distance: >3 FB ?Neck ROM: Full ? ? ? Dental ?no notable dental hx. ? ?  ?Pulmonary ?former smoker,  ?  ?Pulmonary exam normal ?breath sounds clear to auscultation ? ? ? ? ? ? Cardiovascular ?hypertension, Pt. on medications ?+ Peripheral Vascular Disease  ?Normal cardiovascular exam+ dysrhythmias Atrial Fibrillation  ?Rhythm:Regular Rate:Normal ? ? ?  ?Neuro/Psych ?Anxiety   ? GI/Hepatic ?GERD  Medicated and Controlled,  ?Endo/Other  ? ? Renal/GU ?  ? ?  ?Musculoskeletal ? ? Abdominal ?  ?Peds ? Hematology ?  ?Anesthesia Other Findings ? ? Reproductive/Obstetrics ? ?  ? ? ? ? ? ? ? ? ? ? ? ? ? ?  ?  ? ? ? ? ? ? ? ? ?Anesthesia Physical ? ?Anesthesia Plan ? ?ASA: 3 ? ?Anesthesia Plan: General  ? ?Post-op Pain Management:   ? ?Induction: Intravenous ? ?PONV Risk Score and Plan: 4 or greater and Propofol infusion and Treatment may vary due to age or medical condition ? ?Airway Management Planned: Mask ? ?Additional Equipment:  ? ?Intra-op Plan:  ? ?Post-operative Plan:  ? ?Informed Consent: I have reviewed the patients History and Physical, chart, labs and discussed the procedure including the risks, benefits and alternatives for the proposed anesthesia with the patient or authorized representative who has indicated his/her understanding and acceptance.  ? ? ? ? ? ?Plan Discussed with: CRNA and Surgeon ? ?Anesthesia Plan Comments:   ? ? ? ? ? ? ?Anesthesia Quick Evaluation ? ?

## 2021-11-22 NOTE — Telephone Encounter (Signed)
Dr. Johney Frame, pt reports she had 3 unsuccessful DCCV's today and is inquiring from Dr. Johney Frame what her next steps are.  ?Will route this message to Dr. Johney Frame to further review and advise, and I will follow-up with the pt accordingly thereafter.  ? ?

## 2021-11-22 NOTE — Telephone Encounter (Signed)
Prior Authorization for SPLIT sent to [facility] via web portal.  ? Prior Authorization is not required for the requested services ?

## 2021-11-22 NOTE — Telephone Encounter (Signed)
Dr. Johney Frame, when ordering amiodarone the system brings up a red flag warning stating pt has a history of prolong QT interval or torsades and/or on the medication Plaquenil with notation that amiodarone should not be taken with this medication.  ? ?Should I just get her an appointment with afib clinic and have them further advise on med management or proceed with ordering?  ?Please advise! ?

## 2021-11-23 ENCOUNTER — Encounter: Payer: Self-pay | Admitting: *Deleted

## 2021-11-23 ENCOUNTER — Encounter (HOSPITAL_COMMUNITY): Payer: Self-pay | Admitting: Cardiovascular Disease

## 2021-11-23 NOTE — Telephone Encounter (Signed)
Message ?Received: Yesterday ?Freada Bergeron, MD  Nuala Alpha, LPN ?Afib clinic alone sounds totally fair.  ?

## 2021-11-23 NOTE — Telephone Encounter (Signed)
Reaching out to afib clinic now to see where we can get the pt scheduled to see them.  ?Will call her back with an appt date. ?

## 2021-11-23 NOTE — Telephone Encounter (Signed)
Spoke with the pt and afib clinic.  ?Pt is scheduled to see afib clinic next Tuesday 4/11 at 0900.  She is aware to arrive 20 mins prior to this appt.  Pt aware of how to get to afib clinic and parking code is 1104. ?Pt verbalized understanding and agrees with this plan. ?

## 2021-11-26 ENCOUNTER — Other Ambulatory Visit (HOSPITAL_COMMUNITY): Payer: Self-pay

## 2021-11-27 ENCOUNTER — Ambulatory Visit (HOSPITAL_COMMUNITY)
Admission: RE | Admit: 2021-11-27 | Discharge: 2021-11-27 | Disposition: A | Discharge status: Home / Self Care | Payer: Medicare Other | Source: Ambulatory Visit | Attending: Nurse Practitioner | Admitting: Nurse Practitioner

## 2021-11-27 ENCOUNTER — Other Ambulatory Visit (HOSPITAL_COMMUNITY): Payer: Self-pay

## 2021-11-27 ENCOUNTER — Encounter (HOSPITAL_COMMUNITY): Payer: Self-pay | Admitting: Nurse Practitioner

## 2021-11-27 VITALS — BP 156/86 | HR 86 | Ht 65.0 in | Wt 255.2 lb

## 2021-11-27 DIAGNOSIS — Z8616 Personal history of COVID-19: Secondary | ICD-10-CM | POA: Diagnosis not present

## 2021-11-27 DIAGNOSIS — I480 Paroxysmal atrial fibrillation: Secondary | ICD-10-CM | POA: Diagnosis not present

## 2021-11-27 DIAGNOSIS — D6869 Other thrombophilia: Secondary | ICD-10-CM

## 2021-11-27 DIAGNOSIS — Z79899 Other long term (current) drug therapy: Secondary | ICD-10-CM | POA: Diagnosis not present

## 2021-11-27 DIAGNOSIS — Z7901 Long term (current) use of anticoagulants: Secondary | ICD-10-CM | POA: Diagnosis not present

## 2021-11-27 DIAGNOSIS — I10 Essential (primary) hypertension: Secondary | ICD-10-CM | POA: Diagnosis not present

## 2021-11-27 DIAGNOSIS — M069 Rheumatoid arthritis, unspecified: Secondary | ICD-10-CM | POA: Diagnosis not present

## 2021-11-27 DIAGNOSIS — I4891 Unspecified atrial fibrillation: Secondary | ICD-10-CM | POA: Diagnosis present

## 2021-11-27 DIAGNOSIS — G629 Polyneuropathy, unspecified: Secondary | ICD-10-CM | POA: Diagnosis not present

## 2021-11-27 DIAGNOSIS — M48061 Spinal stenosis, lumbar region without neurogenic claudication: Secondary | ICD-10-CM | POA: Insufficient documentation

## 2021-11-27 MED ORDER — METOPROLOL SUCCINATE ER 50 MG PO TB24
75.0000 mg | ORAL_TABLET | Freq: Two times a day (BID) | ORAL | 1 refills | Status: DC
  Filled 2021-11-27 – 2022-01-10 (×2): qty 270, 90d supply, fill #0

## 2021-11-27 NOTE — Patient Instructions (Signed)
Increase metoprolol to 75mg twice a day 

## 2021-11-27 NOTE — Progress Notes (Addendum)
? ?Primary Care Physician: Caren Macadam, MD ?Referring Physician: Dr. Johney Frame ? ? ?[name] is a 71 y.o. female with a h/o HTN, RA on plaquenil, Covid  Pneumonia hospitalization in February at which time, afib was found. No prior h/o of afib. She recently failed cardioversion.  She is now in the afib clinic to discuss options to restore SR. She was found to have prolonged QT in the hospital  with a qt over 500 ms. She is on plaquenil for RA, which is contraindicated with most antiarrythmic's due to qt prolonging potential. CHA2DS2VASc  score of 3. She is on eliquis 5 mg bid, metoprolol succinate 50 mg bid.  She is in SR today. She feels she is in afib intermittently per fluctuations on her Pulse OX.  She is pending  a sleep study for snoring. No alcohol, minimal caffeine, no tobacco. She feels she is almost back  to baseline from Covid. She is limited in her activity from  lumbar stenosis neuropathy. She has chronic LLE swelling. Adjusts her furosemide as needed.   ? ?Today, she denies symptoms of palpitations, chest pain, shortness of breath, orthopnea, PND, lower extremity edema, dizziness, presyncope, syncope, or neurologic sequela. The patient is tolerating medications without difficulties and is otherwise without complaint today.  ? ?Past Medical History:  ?Diagnosis Date  ? Allergy   ? Cataract   ? BILATERAL-REMOVED 2 YEARS AGO  ? Erythromelalgia ([place])   ? followed by neuro Dr Posey Pronto , mgd on gabapentin , dx several years ago   ? GERD (gastroesophageal reflux disease)   ? Helicobacter pylori gastritis 10/05/2018  ? Hx of colonic polyp - ssp 11/03/2014  ? Hypercalcemia   ? Hypertension   ? Left maxillary fracture ([place]) 07/17/2017  ? fell down my stairs 2 year ago , deneis any metal in place nor difficulty with jax extension   ? Neuropathy   ? 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 Osteopenia 10/17/2011  ? DEXA 09/2007: -1.4 L fem; 10/2011: -1.2 L fem   ? Osteoporosis   ? PONV (postoperative nausea and  vomiting)   ? Pseudogout of foot   ? Rheumatoid arthritis(714.0) dx 2010  ? Shingles   ? hx of   ? Tibial plateau fracture, right, closed, initial encounter 07/16/2017  ? ?Past Surgical History:  ?Procedure Laterality Date  ? BREAST BIOPSY  1972  ? Broken wrist  2010  ? CARDIOVERSION N/A 11/22/2021  ? Procedure: CARDIOVERSION;  Surgeon: Geralynn Rile, MD;  Location: [facility];  Service: Cardiovascular;  Laterality: N/A;  ? CATARACT EXTRACTION  03/2012  ? left  ? COLONOSCOPY    ? COSMETIC SURGERY    ? FRACTURE SURGERY    ? INNER EAR SURGERY    ? busted ear drum  ? MAXIMUM ACCESS (MAS)POSTERIOR LUMBAR INTERBODY FUSION (PLIF) 1 LEVEL N/A 10/11/2016  ? Procedure: Lumbar one-Sacral one Maximum access posterior lumbar interbody fusion;  Surgeon: Erline Levine, MD;  Location: [facility];  Service: Neurosurgery;  Laterality: N/A;  ? ORIF WRIST FRACTURE Right 07/18/2017  ? Procedure: OPEN REDUCTION INTERNAL FIXATION (ORIF) WRIST FRACTURE;  Surgeon: Renette Butters, MD;  Location: [facility];  Service: Orthopedics;  Laterality: Right;  ? PARATHYROIDECTOMY Left 04/12/2019  ? Procedure: LEFT SUPERIOR PARATHYROIDECTOMY;  Surgeon: [name], MD;  Location: [facility];  Service: General;  Laterality: Left;  ? pneumonia  2007  ? ? ?Current Outpatient Medications  ?Medication Sig Dispense Refill  ? apixaban (ELIQUIS) 5 MG TABS tablet Take  1 tablet by mouth 2  times daily. 60 tablet 4  ? Cholecalciferol (VITAMIN D) 125 MCG (5000 UT) CAPS Take 5,000 Units by mouth daily.    ? diclofenac sodium (VOLTAREN) 1 % GEL Apply 2 g topically 4 (four) times daily as needed (pain).    ? fexofenadine (ALLEGRA) 180 MG tablet Take 180 mg by mouth daily as needed (allergies).    ? furosemide (LASIX) 20 MG tablet Take 2 - 3 tablets by mouth 2 times daily as needed for edema. (need appt for refills) (Patient taking differently: Take 60 mg by mouth 2 (two) times daily as needed for edema.) 100 tablet 1  ? gabapentin (NEURONTIN) 100 MG capsule TAKE 3  CAPSULES IN THE MORNING AND 2 CAPSULES AT BEDTIME. OK TO TAKE EXTRA CAPSULE AT BEDTIME AS NEEDED 540 capsule 3  ? hydroxychloroquine (PLAQUENIL) 200 MG tablet Take 1 tablet by mouth with food or milk twice a day 180 tablet 1  ? hydroxypropyl methylcellulose / hypromellose (ISOPTO TEARS / GONIOVISC) 2.5 % ophthalmic solution Place 1 drop into both eyes daily.    ? losartan (COZAAR) 100 MG tablet TAKE 1 TABLET BY MOUTH ONCE A DAY 90 tablet 1  ? Menthol, Topical Analgesic, (BIOFREEZE) 4 % GEL Apply 1 application. topically daily as needed (pain).    ? metoprolol succinate (TOPROL-XL) 50 MG 24 hr tablet TAKE 1 TABLET BY MOUTH TWICE DAILY (need appt for refills) 180 tablet 0  ? metoprolol tartrate (LOPRESSOR) 25 MG tablet Take 1 tablet  by mouth 3 times daily as needed (for heart rate greater than 110 bpm). 90 tablet 3  ? potassium chloride SA (KLOR-CON M) 20 MEQ tablet Take 1 tablet (20 mEq total) by mouth 2 (two) times daily. 180 tablet 1  ? sulfaSALAzine (AZULFIDINE) 500 MG tablet Take 2 tablets by mouth twice a day 360 tablet 1  ? vitamin C (ASCORBIC ACID) 500 MG tablet Take 500 mg by mouth daily.    ? ?No current facility-administered medications for this encounter.  ? ? ?Allergies  ?Allergen Reactions  ? Amlodipine Besylate Other (See Comments)  ?  Tremors  ? Depakote [Divalproex Sodium] Swelling  ? Nortriptyline Other (See Comments)  ?  tremors  ? Crestor [Rosuvastatin]   ?  Muscle aches, pains. Resolved with stopping medication.   ? Dilaudid [Hydromorphone Hcl]   ?  Headache, muscle tightness  ? Tramadol   ?  Felt "stoned"  ? ? ?Social History  ? ?Socioeconomic History  ? Marital status: Married  ?  Spouse name: Not on file  ? Number of children: 3  ? Years of education: Not on file  ? Highest education level: Not on file  ?Occupational History  ? Occupation: Retired  ?Tobacco Use  ? Smoking status: Former  ?  Packs/day: 4.00  ?  Years: 4.00  ?  Pack years: 16.00  ?  Types: Cigarettes  ?  Start date: 62  ?   Quit date: 79  ?  Years since quitting: 38.2  ? Smokeless tobacco: Never  ?Vaping Use  ? Vaping Use: Never used  ?Substance and Sexual Activity  ? Alcohol use: No  ?  Alcohol/week: 0.0 standard drinks  ? Drug use: No  ? Sexual activity: Not on file  ?Other Topics Concern  ? Not on file  ?Social History Narrative  ? Artist -retired Building control surveyor  ? Married, lives with spouse, [name], he is IT support for [employer]  ? 3 sons  ?  2 caffeinated beverages a day  ? No regular exercise, diet is ok  ? Right handed  ? ?Social Determinants of Health  ? ?Financial Resource Strain: Not on file  ?Food Insecurity: Not on file  ?Transportation Needs: Not on file  ?Physical Activity: Not on file  ?Stress: Not on file  ?Social Connections: Not on file  ?Intimate Partner Violence: Not on file  ? ? ?Family History  ?Problem Relation Age of Onset  ? Stroke Mother   ? Hypertension Father   ? Heart attack Father   ? Hypertension Sister   ?     x 3  ? Hypertension Brother   ? Hyperthyroidism Sister   ?     x2, s/p RAI ablation  ? Hypothyroidism Brother   ? Colon cancer Neg Hx   ? Esophageal cancer Neg Hx   ? Rectal cancer Neg Hx   ? Stomach cancer Neg Hx   ? ? ?ROS- All systems are reviewed and negative except as per the HPI above ? ?Physical Exam: ?Vitals:  ? 11/27/21 0847  ?Weight: 115.8 kg  ?Height: '5\' 5"'$  (1.651 m)  ? ?Wt Readings from Last 3 Encounters:  ?11/27/21 115.8 kg  ?11/22/21 113.9 kg  ?11/16/21 113.9 kg  ? ? ?Labs: ?Lab Results  ?Component Value Date  ? NA 137 11/19/2021  ? K 3.9 11/19/2021  ? CL 99 11/19/2021  ? CO2 30 (H) 11/19/2021  ? GLUCOSE 133 (H) 11/19/2021  ? BUN 16 11/19/2021  ? CREATININE 1.11 (H) 11/19/2021  ? CALCIUM 9.4 11/19/2021  ? PHOS 2.3 (L) 10/11/2021  ? MG 2.1 10/13/2021  ? ?Lab Results  ?Component Value Date  ? INR 1.05 07/18/2017  ? ?Lab Results  ?Component Value Date  ? CHOL 160 08/29/2021  ? HDL 42.30 08/29/2021  ? [place] 88 08/29/2021  ? TRIG 149.0 08/29/2021  ? ? ? ?GEN- The  patient is well appearing, alert and oriented x 3 today.   ?Head- normocephalic, atraumatic ?Eyes-  Sclera clear, conjunctiva pink ?Ears- hearing intact ?Oropharynx- clear ?Neck- supple, no JVP ?Lymph- no cervical

## 2021-11-30 DIAGNOSIS — M069 Rheumatoid arthritis, unspecified: Secondary | ICD-10-CM | POA: Diagnosis not present

## 2021-11-30 DIAGNOSIS — I7381 Erythromelalgia: Secondary | ICD-10-CM | POA: Diagnosis not present

## 2021-11-30 DIAGNOSIS — E785 Hyperlipidemia, unspecified: Secondary | ICD-10-CM | POA: Diagnosis not present

## 2021-11-30 DIAGNOSIS — J9601 Acute respiratory failure with hypoxia: Secondary | ICD-10-CM | POA: Diagnosis not present

## 2021-11-30 DIAGNOSIS — D509 Iron deficiency anemia, unspecified: Secondary | ICD-10-CM | POA: Diagnosis not present

## 2021-11-30 DIAGNOSIS — M19049 Primary osteoarthritis, unspecified hand: Secondary | ICD-10-CM | POA: Diagnosis not present

## 2021-11-30 DIAGNOSIS — Z9981 Dependence on supplemental oxygen: Secondary | ICD-10-CM | POA: Diagnosis not present

## 2021-11-30 DIAGNOSIS — I10 Essential (primary) hypertension: Secondary | ICD-10-CM | POA: Diagnosis not present

## 2021-11-30 DIAGNOSIS — E871 Hypo-osmolality and hyponatremia: Secondary | ICD-10-CM | POA: Diagnosis not present

## 2021-11-30 DIAGNOSIS — M5417 Radiculopathy, lumbosacral region: Secondary | ICD-10-CM | POA: Diagnosis not present

## 2021-11-30 DIAGNOSIS — U071 COVID-19: Secondary | ICD-10-CM | POA: Diagnosis not present

## 2021-11-30 DIAGNOSIS — Z792 Long term (current) use of antibiotics: Secondary | ICD-10-CM | POA: Diagnosis not present

## 2021-11-30 DIAGNOSIS — M810 Age-related osteoporosis without current pathological fracture: Secondary | ICD-10-CM | POA: Diagnosis not present

## 2021-11-30 DIAGNOSIS — Z7951 Long term (current) use of inhaled steroids: Secondary | ICD-10-CM | POA: Diagnosis not present

## 2021-11-30 DIAGNOSIS — K219 Gastro-esophageal reflux disease without esophagitis: Secondary | ICD-10-CM | POA: Diagnosis not present

## 2021-11-30 DIAGNOSIS — Z86010 Personal history of colonic polyps: Secondary | ICD-10-CM | POA: Diagnosis not present

## 2021-11-30 DIAGNOSIS — E213 Hyperparathyroidism, unspecified: Secondary | ICD-10-CM | POA: Diagnosis not present

## 2021-11-30 DIAGNOSIS — G629 Polyneuropathy, unspecified: Secondary | ICD-10-CM | POA: Diagnosis not present

## 2021-11-30 DIAGNOSIS — Z87891 Personal history of nicotine dependence: Secondary | ICD-10-CM | POA: Diagnosis not present

## 2021-11-30 DIAGNOSIS — E876 Hypokalemia: Secondary | ICD-10-CM | POA: Diagnosis not present

## 2021-11-30 DIAGNOSIS — M48062 Spinal stenosis, lumbar region with neurogenic claudication: Secondary | ICD-10-CM | POA: Diagnosis not present

## 2021-11-30 DIAGNOSIS — Z7901 Long term (current) use of anticoagulants: Secondary | ICD-10-CM | POA: Diagnosis not present

## 2021-12-03 ENCOUNTER — Telehealth: Payer: Self-pay | Admitting: *Deleted

## 2021-12-03 NOTE — Telephone Encounter (Signed)
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  She needs a split night sleep study for afib, snoring, and daytime somnolence per [place]. ? ?Please schedule and let me know the date.  Pt aware you will call to arrange. ? ?Thanks, ?Vernecia Umble  ? ?

## 2021-12-03 NOTE — Telephone Encounter (Signed)
Pts split night sleep study is scheduled for 01/02/22. ?Pt made aware of appt date and time by Sleep Study Coordinator. ?

## 2021-12-10 ENCOUNTER — Other Ambulatory Visit: Payer: Self-pay | Admitting: Family Medicine

## 2021-12-10 DIAGNOSIS — R600 Localized edema: Secondary | ICD-10-CM

## 2021-12-11 ENCOUNTER — Other Ambulatory Visit (HOSPITAL_COMMUNITY): Payer: Self-pay

## 2021-12-11 DIAGNOSIS — Z792 Long term (current) use of antibiotics: Secondary | ICD-10-CM | POA: Diagnosis not present

## 2021-12-11 DIAGNOSIS — Z86010 Personal history of colonic polyps: Secondary | ICD-10-CM | POA: Diagnosis not present

## 2021-12-11 DIAGNOSIS — E876 Hypokalemia: Secondary | ICD-10-CM | POA: Diagnosis not present

## 2021-12-11 DIAGNOSIS — Z9981 Dependence on supplemental oxygen: Secondary | ICD-10-CM | POA: Diagnosis not present

## 2021-12-11 DIAGNOSIS — K219 Gastro-esophageal reflux disease without esophagitis: Secondary | ICD-10-CM | POA: Diagnosis not present

## 2021-12-11 DIAGNOSIS — E871 Hypo-osmolality and hyponatremia: Secondary | ICD-10-CM | POA: Diagnosis not present

## 2021-12-11 DIAGNOSIS — M48062 Spinal stenosis, lumbar region with neurogenic claudication: Secondary | ICD-10-CM | POA: Diagnosis not present

## 2021-12-11 DIAGNOSIS — E785 Hyperlipidemia, unspecified: Secondary | ICD-10-CM | POA: Diagnosis not present

## 2021-12-11 DIAGNOSIS — M5417 Radiculopathy, lumbosacral region: Secondary | ICD-10-CM | POA: Diagnosis not present

## 2021-12-11 DIAGNOSIS — E213 Hyperparathyroidism, unspecified: Secondary | ICD-10-CM | POA: Diagnosis not present

## 2021-12-11 DIAGNOSIS — I7381 Erythromelalgia: Secondary | ICD-10-CM | POA: Diagnosis not present

## 2021-12-11 DIAGNOSIS — D509 Iron deficiency anemia, unspecified: Secondary | ICD-10-CM | POA: Diagnosis not present

## 2021-12-11 DIAGNOSIS — Z7901 Long term (current) use of anticoagulants: Secondary | ICD-10-CM | POA: Diagnosis not present

## 2021-12-11 DIAGNOSIS — Z87891 Personal history of nicotine dependence: Secondary | ICD-10-CM | POA: Diagnosis not present

## 2021-12-11 DIAGNOSIS — M19049 Primary osteoarthritis, unspecified hand: Secondary | ICD-10-CM | POA: Diagnosis not present

## 2021-12-11 DIAGNOSIS — U071 COVID-19: Secondary | ICD-10-CM | POA: Diagnosis not present

## 2021-12-11 DIAGNOSIS — M069 Rheumatoid arthritis, unspecified: Secondary | ICD-10-CM | POA: Diagnosis not present

## 2021-12-11 DIAGNOSIS — M810 Age-related osteoporosis without current pathological fracture: Secondary | ICD-10-CM | POA: Diagnosis not present

## 2021-12-11 DIAGNOSIS — I10 Essential (primary) hypertension: Secondary | ICD-10-CM | POA: Diagnosis not present

## 2021-12-11 DIAGNOSIS — J9601 Acute respiratory failure with hypoxia: Secondary | ICD-10-CM | POA: Diagnosis not present

## 2021-12-11 DIAGNOSIS — G629 Polyneuropathy, unspecified: Secondary | ICD-10-CM | POA: Diagnosis not present

## 2021-12-11 DIAGNOSIS — Z7951 Long term (current) use of inhaled steroids: Secondary | ICD-10-CM | POA: Diagnosis not present

## 2021-12-11 MED ORDER — FUROSEMIDE 20 MG PO TABS
40.0000 mg | ORAL_TABLET | Freq: Two times a day (BID) | ORAL | 1 refills | Status: DC | PRN
  Filled 2021-12-11: qty 100, 17d supply, fill #0
  Filled 2022-01-02: qty 100, 17d supply, fill #1

## 2021-12-13 DIAGNOSIS — J9601 Acute respiratory failure with hypoxia: Secondary | ICD-10-CM | POA: Diagnosis not present

## 2021-12-21 DIAGNOSIS — I7381 Erythromelalgia: Secondary | ICD-10-CM | POA: Diagnosis not present

## 2021-12-21 DIAGNOSIS — M0609 Rheumatoid arthritis without rheumatoid factor, multiple sites: Secondary | ICD-10-CM | POA: Diagnosis not present

## 2021-12-21 DIAGNOSIS — M2550 Pain in unspecified joint: Secondary | ICD-10-CM | POA: Diagnosis not present

## 2021-12-21 DIAGNOSIS — M5136 Other intervertebral disc degeneration, lumbar region: Secondary | ICD-10-CM | POA: Diagnosis not present

## 2021-12-26 ENCOUNTER — Other Ambulatory Visit (HOSPITAL_COMMUNITY): Payer: Self-pay

## 2021-12-31 DIAGNOSIS — I872 Venous insufficiency (chronic) (peripheral): Secondary | ICD-10-CM | POA: Diagnosis not present

## 2022-01-02 ENCOUNTER — Ambulatory Visit (HOSPITAL_BASED_OUTPATIENT_CLINIC_OR_DEPARTMENT_OTHER): Payer: Medicare Other | Attending: Cardiology | Admitting: Cardiology

## 2022-01-02 ENCOUNTER — Other Ambulatory Visit: Payer: Self-pay | Admitting: Family Medicine

## 2022-01-02 ENCOUNTER — Other Ambulatory Visit (HOSPITAL_COMMUNITY): Payer: Self-pay

## 2022-01-02 DIAGNOSIS — I4819 Other persistent atrial fibrillation: Secondary | ICD-10-CM

## 2022-01-02 DIAGNOSIS — G4733 Obstructive sleep apnea (adult) (pediatric): Secondary | ICD-10-CM | POA: Diagnosis not present

## 2022-01-02 DIAGNOSIS — G4736 Sleep related hypoventilation in conditions classified elsewhere: Secondary | ICD-10-CM | POA: Diagnosis not present

## 2022-01-02 DIAGNOSIS — I493 Ventricular premature depolarization: Secondary | ICD-10-CM | POA: Insufficient documentation

## 2022-01-02 DIAGNOSIS — R400 Somnolence: Secondary | ICD-10-CM

## 2022-01-02 DIAGNOSIS — R0683 Snoring: Secondary | ICD-10-CM | POA: Diagnosis not present

## 2022-01-02 MED ORDER — POTASSIUM CHLORIDE CRYS ER 20 MEQ PO TBCR
20.0000 meq | EXTENDED_RELEASE_TABLET | Freq: Two times a day (BID) | ORAL | 1 refills | Status: DC
Start: 2022-01-02 — End: 2022-08-22
  Filled 2022-01-02 – 2022-02-20 (×3): qty 180, 90d supply, fill #0
  Filled 2022-04-05 – 2022-05-21 (×2): qty 180, 90d supply, fill #1

## 2022-01-03 ENCOUNTER — Other Ambulatory Visit (HOSPITAL_COMMUNITY): Payer: Self-pay

## 2022-01-03 MED ORDER — SULFASALAZINE 500 MG PO TABS
ORAL_TABLET | ORAL | 1 refills | Status: DC
  Filled 2022-01-03: qty 360, 90d supply, fill #0
  Filled 2022-04-10: qty 360, 90d supply, fill #1

## 2022-01-07 ENCOUNTER — Other Ambulatory Visit (HOSPITAL_COMMUNITY): Payer: Self-pay

## 2022-01-08 ENCOUNTER — Ambulatory Visit: Payer: Medicare Other | Admitting: Cardiology

## 2022-01-08 ENCOUNTER — Encounter: Payer: Self-pay | Admitting: Cardiology

## 2022-01-08 VITALS — BP 136/88 | HR 76 | Ht 66.0 in | Wt 261.4 lb

## 2022-01-08 DIAGNOSIS — I5032 Chronic diastolic (congestive) heart failure: Secondary | ICD-10-CM

## 2022-01-08 DIAGNOSIS — I4819 Other persistent atrial fibrillation: Secondary | ICD-10-CM

## 2022-01-08 DIAGNOSIS — I10 Essential (primary) hypertension: Secondary | ICD-10-CM | POA: Diagnosis not present

## 2022-01-08 NOTE — Progress Notes (Signed)
Electrophysiology Office Note:    Date:  01/08/2022   ID:  [name], [name] November 15, 1950, MRN [id number]  PCP:  Caren Macadam, MD  [facility] Cardiologist:  None  [facility] Electrophysiologist:  Vickie Epley, MD   Referring MD: Sherran Needs, NP   Chief Complaint: New patient consult for Afib ablation  History of Present Illness:    [name] is a 71 y.o. female who presents for an evaluation for consideration of Afib ablation at the request of Roderic Palau, NP. Their medical history includes hypertension, hypercalcemia, erythromelalgia, GERD, rheumatoid arthritis, neuropathy, and shingles.  Prior hospitalization in February at which time she was found to be in atrial fibrillation with a prolonged QT over 500 ms; no prior history of Afib. Of note, she is on plaquenil for RA, which is contraindicated with most antiarrhythmics due to qt prolonging potential. She is on eliquis 5 mg bid, metoprolol succinate 50 mg bid.   She saw Roderic Palau, NP on 11/27/2021. She had a failed cardioversion 11/22/21 but was back in SR at that visit with qt 488 ms. She felt that she was in Afib intermittently per fluctuations on her Pulse OX. She had chronic LLE swelling (adjusts furosemide as needed), and her activity was limited due to lumbar stenosis neuropathy. Metoprolol succinate was increased to 75 mg BID. She was referred to EP to discuss front line ablation.  She is accompanied by a family member. Overall, she does not believe she can tell when she is out of rhythm. However, she will occasionally feel dizzy, anxious, and/or nauseous. Sometimes she will hear a heart sound that she describes as similar to an "electrical charge."  She confirms having rheumatoid arthritis that is well controlled. Her neuropathy is more bothersome. She used to be active. Lately she is able to walk around the house or climb a flight of stairs if she needs to, but not for very long periods. Soon  she needs to bend forwards while walking due to pain.   Every day she notices swelling, frequently "jumping" between her right and left LE.  Last week she was tested for sleep apnea and reportedly has negative results per her MyChart. However she notes that she only slept for about 4 hours at the time. She describes herself as a "shallow breather" at baseline.   She denies any chest pain, or shortness of breath. No headaches, syncope, orthopnea, or PND.     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   Osteopenia 10/17/2011   DEXA 09/2007: -1.4 L fem; 10/2011: -1.2 L fem    Osteoporosis    PONV (postoperative nausea and vomiting)    Pseudogout of foot    Rheumatoid arthritis(714.0) dx 2010   Shingles    hx of    Tibial plateau fracture, right, closed, initial encounter 07/16/2017    Past Surgical History:  Procedure  Laterality Date   BREAST BIOPSY  1972   Broken wrist  2010   CARDIOVERSION N/A 11/22/2021   Procedure: CARDIOVERSION;  Surgeon: Geralynn Rile, MD;  Location: [place];  Service: Cardiovascular;  Laterality: N/A;   CATARACT EXTRACTION  03/2012   left   COLONOSCOPY     COSMETIC SURGERY     FRACTURE SURGERY     INNER EAR SURGERY     busted ear drum   MAXIMUM ACCESS (MAS)POSTERIOR LUMBAR INTERBODY FUSION (PLIF) 1 LEVEL N/A 10/11/2016   Procedure: Lumbar one-Sacral one Maximum access posterior lumbar interbody fusion;  Surgeon: Erline Levine, MD;  Location: [place];  Service:  Neurosurgery;  Laterality: N/A;   ORIF WRIST FRACTURE Right 07/18/2017   Procedure: OPEN REDUCTION INTERNAL FIXATION (ORIF) WRIST FRACTURE;  Surgeon: Renette Butters, MD;  Location: [place];  Service: Orthopedics;  Laterality: Right;   PARATHYROIDECTOMY Left 04/12/2019   Procedure: LEFT SUPERIOR PARATHYROIDECTOMY;  Surgeon: [name], MD;  Location: [facility];  Service: General;  Laterality: Left;   pneumonia  2007    Current Medications: Current Meds  Medication Sig   apixaban (ELIQUIS) 5 MG TABS tablet Take 1 tablet by mouth 2  times daily.   Cholecalciferol (VITAMIN D) 125 MCG (5000 UT) CAPS Take 5,000 Units by mouth daily.   diclofenac sodium (VOLTAREN) 1 % GEL Apply 2 g topically 4 (four) times daily as needed (pain).   fexofenadine (ALLEGRA) 180 MG tablet Take 180 mg by mouth daily as needed (allergies).   furosemide (LASIX) 20 MG tablet Take 2 - 3 tablets by mouth 2 times daily as needed for edema. (need appt for refills)   gabapentin (NEURONTIN) 100 MG capsule TAKE 3 CAPSULES IN THE MORNING AND 2 CAPSULES AT BEDTIME. OK TO TAKE EXTRA CAPSULE AT BEDTIME AS NEEDED   hydroxychloroquine (PLAQUENIL) 200 MG tablet Take 1 tablet by mouth with food or milk twice a day   hydroxypropyl methylcellulose / hypromellose (ISOPTO TEARS / GONIOVISC) 2.5 % ophthalmic solution Place 1 drop into both eyes daily.   losartan (COZAAR) 100 MG tablet TAKE 1 TABLET BY MOUTH ONCE A DAY   Menthol, Topical Analgesic, (BIOFREEZE) 4 % GEL Apply 1 application. topically daily as needed (pain).   metoprolol succinate (TOPROL-XL) 50 MG 24 hr tablet Take 1 and 1/2 tablets (75 mg total) by mouth 2 (two) times daily.   metoprolol tartrate (LOPRESSOR) 25 MG tablet Take 1 tablet  by mouth 3 times daily as needed (for heart rate greater than 110 bpm).   potassium chloride SA (KLOR-CON M) 20 MEQ tablet Take 1 tablet by mouth 2 times daily.   sulfaSALAzine (AZULFIDINE) 500 MG tablet Take 2 tablets by mouth twice a day.    vitamin C (ASCORBIC ACID) 500 MG tablet Take 500 mg by mouth daily.     Allergies:   Amlodipine besylate, Depakote [divalproex sodium], Nortriptyline, Crestor [rosuvastatin], Dilaudid [hydromorphone hcl], and Tramadol   Social History   Socioeconomic History   Marital status: Married    Spouse name: Not on file   Number of children: 3   Years of education: Not on file   Highest education level: Not on file  Occupational History   Occupation: Retired  Tobacco Use   Smoking status: Former    Packs/day: 4.00    Years: 4.00    Pack years: 16.00    Types: Cigarettes    Start date: 10    Quit date: 1985    Years since quitting:  38.4   Smokeless tobacco: Never  Vaping Use   Vaping Use: Never used  Substance and Sexual Activity   Alcohol use: No    Alcohol/week: 0.0 standard drinks   Drug use: No   Sexual activity: Not on file  Other Topics Concern   Not on file  Social History Narrative   Artist -retired Building control surveyor   Married, lives with spouse, [name], he is IT support for [employer]   3 sons   2 caffeinated beverages a day   No regular exercise, diet is ok   Right handed   Social Determinants of Health   Financial Resource Strain: Not on file  Food Insecurity: Not on file  Transportation Needs: Not on file  Physical Activity: Not on file  Stress: Not on file  Social Connections: Not on file     Family History: The patient's family history includes Heart attack in her father; Hypertension in her brother, father, and sister; Hyperthyroidism in her sister; Hypothyroidism in her brother; Stroke in her mother. There is no history of Colon cancer, Esophageal cancer, Rectal cancer, or Stomach cancer.  ROS:   Please see the history of present illness.    (+) Dizziness (+) Anxiety/Nausea (+) Arthralgias (+) Neuropathy (+) Strange heart sounds (+) Bilateral LE edema All other systems reviewed and are negative.  EKGs/Labs/Other Studies Reviewed:     The following studies were reviewed today:  10/14/2021  Echo  1. Left ventricular ejection fraction, by estimation, is 55 to 60%. The  left ventricle has normal function. The left ventricle has no regional  wall motion abnormalities. The left ventricular internal cavity size was  mildly dilated. There is mild left  ventricular hypertrophy. Left ventricular diastolic function could not be  evaluated.   2. Right ventricular systolic function is mildly reduced. The right  ventricular size is mildly enlarged. There is mildly elevated pulmonary  artery systolic pressure.   3. The mitral valve is normal in structure. Mild mitral valve  regurgitation. No evidence of mitral stenosis.   4. The aortic valve is tricuspid. Aortic valve regurgitation is trivial.  Aortic valve sclerosis is present, with no evidence of aortic valve  stenosis.   5. Aortic dilatation noted. There is mild dilatation of the aortic root,  measuring 41 mm. There is mild dilatation of the ascending aorta,  measuring 41 mm.   6. The inferior vena cava is dilated in size with >50% respiratory  variability, suggesting right atrial pressure of 8 mmHg.   10/10/2021 CTA Chest FINDINGS: Cardiovascular: Satisfactory opacification of the pulmonary arteries to the segmental level. No evidence of pulmonary embolism. There is mild cardiomegaly with mild coronary artery calcification. No pericardial effusion.   Mediastinum/Nodes: No enlarged mediastinal, hilar, or axillary lymph nodes. Thyroid gland, trachea, and esophagus demonstrate no significant findings.   Lungs/Pleura: Marked severity multifocal infiltrates are seen throughout both lungs.   Very small bilateral pleural effusions are noted.   No pneumothorax is identified.   Upper Abdomen: No acute abnormality.   Musculoskeletal: A nondisplaced fracture deformity of the right transverse process is seen at the level of T1 (axial CT image 9, CT series 6).    Multilevel degenerative changes seen throughout the thoracic spine.   Review of the MIP images confirms the above findings.   IMPRESSION: 1. Marked severity bilateral multifocal infiltrates. 2. Very small bilateral pleural effusions. 3. Nondisplaced fracture deformity of the right transverse process at the level of T1.  07/20/2019  ABI  Doppler Waveforms may be blunted due to significant pedal edema.     Summary:  Right: Resting right ankle-brachial index is within normal range. No  evidence of significant right lower extremity arterial disease.   Left: Resting left ankle-brachial index is within normal range. No  evidence of significant left lower extremity arterial disease.     EKG:   EKG is personally reviewed.  EKG was not ordered.    Recent Labs: 10/10/2021: B Natriuretic Peptide 428.4 10/13/2021: Magnesium 2.1 11/05/2021: ALT 13; TSH 2.52 11/19/2021: BUN 16; Creatinine, Ser 1.11; Hemoglobin 11.8; Platelets 215; Potassium 3.9; Sodium 137   Recent Lipid Panel    Component Value Date/Time   CHOL 160 08/29/2021 1352   CHOL 225 (H) 06/21/2014 1146   TRIG 149.0 08/29/2021 1352   TRIG 169 (H) 06/21/2014 1146   HDL 42.30 08/29/2021 1352   HDL 48 06/21/2014 1146   CHOLHDL 4 08/29/2021 1352   VLDL 29.8 08/29/2021 1352   LDLCALC 88 08/29/2021 1352   LDLCALC 158 (H) 03/31/2020 0845   LDLCALC 143 (H) 06/21/2014 1146   LDLDIRECT 153.0 05/29/2021 1109    Physical Exam:    VS:  BP 136/88   Pulse 76   Ht '5\' 6"'$  (1.676 m)   Wt 261 lb 6.4 oz (118.6 kg)   SpO2 91%   BMI 42.19 kg/m     Wt Readings from Last 3 Encounters:  01/08/22 261 lb 6.4 oz (118.6 kg)  01/02/22 260 lb (117.9 kg)  11/27/21 255 lb 3.2 oz (115.8 kg)     GEN: Well nourished, well developed in no acute distress; In wheelchair..  Obese HEENT: Normal NECK: No JVD; No carotid bruits LYMPHATICS: No lymphadenopathy CARDIAC: Irregularly irregular no murmurs, rubs, gallops RESPIRATORY:  Clear to auscultation  without rales, wheezing or rhonchi  ABDOMEN: Soft, non-tender, non-distended MUSCULOSKELETAL:  Trace-1+ pitting edema; No deformity  SKIN: Warm and dry NEUROLOGIC:  Alert and oriented x 3 PSYCHIATRIC:  Normal affect       ASSESSMENT:    1. Persistent atrial fibrillation ([place])   2. Chronic diastolic heart failure ([place])   3. Primary hypertension    PLAN:    In order of problems listed above:  #Persistent atrial fibrillation Symptomatic.  Chronic diastolic heart failure.  Edema and shortness of breath with exertion.  Rhythm control is indicated.  Failed cardioversion in the past.  I discussed treatment options including antiarrhythmic drug therapy and catheter ablation.  She would need to consider transitioning off Plaquenil to initiate antiarrhythmic drug therapy.  I discussed catheter ablation in detail with the patient and her husband during today's visit including the risks, recovery and likelihood of success.  I discussed the possibility of needing repeat ablation procedures or antiarrhythmic drug therapy after an initial ablation procedure.  She would like to think about her options and will let us know if she would like to schedule which I think is very reasonable.  Risk, benefits, and alternatives to EP study and radiofrequency ablation for afib were also discussed in detail today. These risks include but are not limited to stroke, bleeding, vascular damage, tamponade, perforation, damage to the esophagus, lungs, and other structures, pulmonary vein stenosis, worsening renal function, and death. Carto, ICE, anesthesia are requested for the procedure.  Will also obtain CT PV protocol prior to the procedure to exclude LAA thrombus and further evaluate atrial anatomy.  #Chronic diastolic heart failure NYHA class II-III.  Warm and slightly volume overloaded on exam.  Continue Lasix. Rhythm  control as above.  #Hypertension Controlled.  Continue current medical therapy  Total time  spent with patient today 60 minutes. This includes reviewing records, evaluating the patient and coordinating care.  Medication Adjustments/Labs and Tests Ordered: Current medicines are reviewed at length with the patient today.  Concerns regarding medicines are outlined above.  No orders of the defined types were placed in this encounter.  No orders of the defined types were placed in this encounter.   I,Mathew Stumpf,acting as a Education administrator for Vickie Epley, MD.,have documented all relevant documentation on the behalf of Vickie Epley, MD,as directed by  Vickie Epley, MD while in the presence of Vickie Epley, MD.  I, Vickie Epley, MD, have reviewed all documentation for this visit. The documentation on 01/08/22 for the exam, diagnosis, procedures, and orders are all accurate and complete.   Signed, Hilton Cork. Quentin Ore, MD, [facility], [facility] 01/08/2022 10:40 AM    Electrophysiology  [facility]

## 2022-01-08 NOTE — Patient Instructions (Signed)
Medication Instructions:  Your physician recommends that you continue on your current medications as directed. Please refer to the Current Medication list given to you today. *If you need a refill on your cardiac medications before your next appointment, please call your pharmacy*  Lab Work: None. If you have labs (blood work) drawn today and your tests are completely normal, you will receive your results only by: [place] (if you have MyChart) OR A paper copy in the mail If you have any lab test that is abnormal or we need to change your treatment, we will call you to review the results.  Testing/Procedures: Your physician has requested that you have cardiac CT. Cardiac computed tomography (CT) is a painless test that uses an x-ray machine to take clear, detailed pictures of your heart. For further information please visit [email or web address]. Please follow instruction sheet as given.  Your physician has recommended that you have an ablation. Catheter ablation is a medical procedure used to treat some cardiac arrhythmias (irregular heartbeats). During catheter ablation, a long, thin, flexible tube is put into a blood vessel in your groin (upper thigh), or neck. This tube is called an ablation catheter. It is then guided to your heart through the blood vessel. Radio frequency waves destroy small areas of heart tissue where abnormal heartbeats may cause an arrhythmia to start. Please see the instruction sheet given to you today.   Follow-Up: At [facility], you and your health needs are our priority.  As part of our continuing mission to provide you with exceptional heart care, we have created designated Provider Care Teams.  These Care Teams include your primary Cardiologist (physician) and Advanced Practice Providers (APPs -  Physician Assistants and Nurse Practitioners) who all work together to provide you with the care you need, when you need it.  Your physician wants you to  follow-up in: Please call or mychart Christine Crull RN if you would like to proceed with Afib ablation.  Dates available at this time: Aug- 10, 21, 24, 28, 29  We recommend signing up for the patient portal called "MyChart".  Sign up information is provided on this After Visit Summary.  MyChart is used to connect with patients for Virtual Visits (Telemedicine).  Patients are able to view lab/test results, encounter notes, upcoming appointments, etc.  Non-urgent messages can be sent to your provider as well.   To learn more about what you can do with MyChart, go to [email or web address].    Any Other Special Instructions Will Be Listed Below (If Applicable).  Cardiac Ablation Cardiac ablation is a procedure to destroy (ablate) some heart tissue that is sending bad signals. These bad signals cause problems in heart rhythm. The heart has many areas that make these signals. If there are problems in these areas, they can make the heart beat in a way that is not normal. Destroying some tissues can help make the heart rhythm normal. Tell your doctor about: Any allergies you have. All medicines you are taking. These include vitamins, herbs, eye drops, creams, and over-the-counter medicines. Any problems you or family members have had with medicines that make you fall asleep (anesthetics). Any blood disorders you have. Any surgeries you have had. Any medical conditions you have, such as kidney failure. Whether you are pregnant or may be pregnant. What are the risks? This is a safe procedure. But problems may occur, including: Infection. Bruising and bleeding. Bleeding into the chest. Stroke or blood clots. Damage to nearby areas of your body. Allergies to  medicines or dyes. The need for a pacemaker if the normal system is damaged. Failure of the procedure to treat the problem. What happens before the procedure? Medicines Ask your doctor about: Changing or stopping your normal medicines. This is  important. Taking aspirin and ibuprofen. Do not take these medicines unless your doctor tells you to take them. Taking other medicines, vitamins, herbs, and supplements. General instructions Follow instructions from your doctor about what you cannot eat or drink. Plan to have someone take you home from the hospital or clinic. If you will be going home right after the procedure, plan to have someone with you for 24 hours. Ask your doctor what steps will be taken to prevent infection. What happens during the procedure?  An IV tube will be put into one of your veins. You will be given a medicine to help you relax. The skin on your neck or groin will be numbed. A cut (incision) will be made in your neck or groin. A needle will be put through your cut and into a large vein. A tube (catheter) will be put into the needle. The tube will be moved to your heart. Dye may be put through the tube. This helps your doctor see your heart. Small devices (electrodes) on the tube will send out signals. A type of energy will be used to destroy some heart tissue. The tube will be taken out. Pressure will be held on your cut. This helps stop bleeding. A bandage will be put over your cut. The exact procedure may vary among doctors and hospitals. What happens after the procedure? You will be watched until you leave the hospital or clinic. This includes checking your heart rate, breathing rate, oxygen, and blood pressure. Your cut will be watched for bleeding. You will need to lie still for a few hours. Do not drive for 24 hours or as long as your doctor tells you. Summary Cardiac ablation is a procedure to destroy some heart tissue. This is done to treat heart rhythm problems. Tell your doctor about any medical conditions you may have. Tell him or her about all medicines you are taking to treat them. This is a safe procedure. But problems may occur. These include infection, bruising, bleeding, and damage to  nearby areas of your body. Follow what your doctor tells you about food and drink. You may also be told to change or stop some of your medicines. After the procedure, do not drive for 24 hours or as long as your doctor tells you. This information is not intended to replace advice given to you by your health care provider. Make sure you discuss any questions you have with your health care provider. Document Revised: 07/08/2019 Document Reviewed: 07/08/2019 Elsevier Patient Education  [place].

## 2022-01-09 ENCOUNTER — Other Ambulatory Visit (HOSPITAL_COMMUNITY): Payer: Self-pay

## 2022-01-11 ENCOUNTER — Other Ambulatory Visit (HOSPITAL_COMMUNITY): Payer: Self-pay

## 2022-01-11 NOTE — Procedures (Signed)
   Patient Name: [name], [name] Date: 01/02/2022 Gender: Female D.O.B: August 31, 1950 Age (years): 59 Referring Provider: Gwyndolyn Kaufman MD Height (inches): 66 Interpreting Physician: Fransico Him MD, ABSM Weight (lbs): 260 RPSGT: Jorge Ny BMI: 42 MRN: [id number] Neck Size: 16.50  CLINICAL INFORMATION Sleep Study Type: NPSG  Indication for sleep study: Depression, Hypertension, Morbid Obesity, Snoring, Witnesses Apnea / Gasping During Sleep  Epworth Sleepiness Score: 8  SLEEP STUDY TECHNIQUE As per the AASM Manual for the Scoring of Sleep and Associated Events v2.3 (April 2016) with a hypopnea requiring 4% desaturations.  The channels recorded and monitored were frontal, central and occipital EEG, electrooculogram (EOG), submentalis EMG (chin), nasal and oral airflow, thoracic and abdominal wall motion, anterior tibialis EMG, snore microphone, electrocardiogram, and pulse oximetry.  MEDICATIONS Medications self-administered by patient taken the night of the study : GABAPENTIN, PLAQUENIL, Potassium Chloride SA, SULFASALAZINE, VITAMIN D  SLEEP ARCHITECTURE The study was initiated at 10:55:49 PM and ended at 4:52:09 AM.  Sleep onset time was 29.0 minutes and the sleep efficiency was 60.8%. The total sleep time was 216.5 minutes.  Stage REM latency was N/A minutes.  The patient spent 21.71% of the night in stage N1 sleep, 63.05% in stage N2 sleep, 15.24% in stage N3 and 0% in REM.  Alpha intrusion was absent.  Supine sleep was 22.40%.  RESPIRATORY PARAMETERS The overall apnea/hypopnea index (AHI) was 42.7 per hour. There were 67 total apneas, including 67 obstructive, 0 central and 0 mixed apneas. There were 87 hypopneas and 54 RERAs.  The AHI during Stage REM sleep was N/A per hour.  AHI while supine was 43.3 per hour.  The mean oxygen saturation was 92.17%. The minimum SpO2 during sleep was 73.00%.  moderate snoring was noted during this  study.  CARDIAC DATA The 2 lead EKG demonstrated sinus rhythm. The mean heart rate was 74.49 beats per minute. Other EKG findings include: PVCs.  LEG MOVEMENT DATA The total PLMS were 0 with a resulting PLMS index of 0.00. Associated arousal with leg movement index was 0.0 .  IMPRESSIONS - Severe obstructive sleep apnea occurred during this study (AHI = 42.7/h). - Moderate oxygen desaturation was noted during this study (Min O2 = 73.00%). - The patient snored with moderate snoring volume. - EKG findings include PVCs. - Clinically significant periodic limb movements did not occur during sleep. No significant associated arousals.  DIAGNOSIS - Obstructive Sleep Apnea (G47.33) - Nocturnal Hypoxemia (G47.36)  RECOMMENDATIONS - Therapeutic CPAP titration to determine optimal pressure required to alleviate sleep disordered breathing. - Avoid alcohol, sedatives and other CNS depressants that may worsen sleep apnea and disrupt normal sleep architecture. - Sleep hygiene should be reviewed to assess factors that may improve sleep quality. - Weight management and regular exercise should be initiated or continued if appropriate.  [Electronically signed] 01/11/2022 06:02 AM  Fransico Him MD, ABSM Diplomate, American Board of Sleep Medicine

## 2022-01-12 DIAGNOSIS — J9601 Acute respiratory failure with hypoxia: Secondary | ICD-10-CM | POA: Diagnosis not present

## 2022-01-15 ENCOUNTER — Telehealth: Payer: Self-pay | Admitting: Cardiology

## 2022-01-15 NOTE — Telephone Encounter (Signed)
Pt returning call to nurse regarding appt dates for Ablation. Pt states that any of the dates that the nurse provided would be good with her. Pt would like a callback to confirm. Please advise

## 2022-01-15 NOTE — Telephone Encounter (Signed)
Patient picked ablation date Aug 29 and pre op lab Aug 9. RN will call with CT scan and procedure instructions when work up completed.

## 2022-01-18 ENCOUNTER — Telehealth: Payer: Self-pay | Admitting: *Deleted

## 2022-01-18 DIAGNOSIS — R400 Somnolence: Secondary | ICD-10-CM

## 2022-01-18 DIAGNOSIS — G4733 Obstructive sleep apnea (adult) (pediatric): Secondary | ICD-10-CM

## 2022-01-18 NOTE — Telephone Encounter (Signed)
-----   Message from Lauralee Evener, [place] sent at 01/11/2022  8:37 AM EDT -----  ----- Message ----- From: Sueanne Margarita, MD Sent: 01/11/2022   6:04 AM EDT To: Cv Div Sleep Studies  Please let patient know that they have sleep apnea.  Recommend therapeutic CPAP titration for treatment of patient's sleep disordered breathing.  If unable to perform an in lab titration then initiate ResMed auto CPAP from 4 to 15cm H2O with heated humidity and mask of choice and overnight pulse ox on CPAP.

## 2022-01-18 NOTE — Telephone Encounter (Addendum)
The patient has been notified of the result and verbalized understanding.  All questions (if any) were answered. [name], [place] 01/18/2022 5:03 PM    Will precert titration

## 2022-01-22 ENCOUNTER — Other Ambulatory Visit: Payer: Self-pay | Admitting: Family Medicine

## 2022-01-22 ENCOUNTER — Other Ambulatory Visit (HOSPITAL_COMMUNITY): Payer: Self-pay

## 2022-01-22 DIAGNOSIS — R600 Localized edema: Secondary | ICD-10-CM

## 2022-01-22 MED ORDER — FUROSEMIDE 20 MG PO TABS
40.0000 mg | ORAL_TABLET | Freq: Two times a day (BID) | ORAL | 1 refills | Status: DC | PRN
  Filled 2022-01-22: qty 100, 17d supply, fill #0
  Filled 2022-02-13: qty 100, 17d supply, fill #1

## 2022-01-29 ENCOUNTER — Other Ambulatory Visit: Payer: Self-pay | Admitting: Family Medicine

## 2022-01-29 MED ORDER — LOSARTAN POTASSIUM 100 MG PO TABS
100.0000 mg | ORAL_TABLET | Freq: Every day | ORAL | 1 refills | Status: DC
  Filled 2022-01-29: qty 90, 90d supply, fill #0
  Filled 2022-04-30: qty 90, 90d supply, fill #1

## 2022-01-30 ENCOUNTER — Other Ambulatory Visit (HOSPITAL_COMMUNITY): Payer: Self-pay

## 2022-01-31 ENCOUNTER — Telehealth: Payer: Self-pay | Admitting: Cardiology

## 2022-01-31 NOTE — Telephone Encounter (Signed)
Informed patient pharmacist states it is possible for metoprolol to cause hair loss.  Patient understand she is taking this medication to help with BP/HR, but was disappointed to hear this and would like to see if there is anything that can be done about this. Patient reports her hair has been coming out in clumps since increasing metoprolol.  Patient states she will try taking biotin in the meantime.  Will forward to Roderic Palau to review and advise on any recommendations.

## 2022-01-31 NOTE — Telephone Encounter (Signed)
Pt would like to know if there is a vitamin or something else that she can take due to her hair falling out. Pt states that her hair is falling out by the handful. Please advise

## 2022-01-31 NOTE — Telephone Encounter (Signed)
She could try biotin. Will not work unless she has a biotin deficiency, but she can try it.

## 2022-01-31 NOTE — Telephone Encounter (Signed)
Spoke with patient and discussed recommendation from pharmacist:  She could try biotin. Will not work unless she has a biotin deficiency, but she can try it.  Patient reports she noticed increased hair loss when her metoprolol dose was increased a month ago. She asked if this medication could be causing her hair loss. Informed patient that I would forward to our pharmacist for their input on this.  Patient verbalized understanding and states she will try a biotin supplement to see if it helps.

## 2022-02-04 ENCOUNTER — Other Ambulatory Visit (HOSPITAL_COMMUNITY): Payer: Self-pay

## 2022-02-04 MED ORDER — DILTIAZEM HCL ER COATED BEADS 180 MG PO CP24
180.0000 mg | ORAL_CAPSULE | Freq: Every day | ORAL | 1 refills | Status: DC
  Filled 2022-02-04: qty 30, 30d supply, fill #0

## 2022-02-04 NOTE — Telephone Encounter (Signed)
Per Roderic Palau NP will stop metoprolol succinate and start Cardizem '180mg'$  once a day. Pt in agreement.

## 2022-02-07 ENCOUNTER — Other Ambulatory Visit: Payer: Self-pay

## 2022-02-07 DIAGNOSIS — I4811 Longstanding persistent atrial fibrillation: Secondary | ICD-10-CM

## 2022-02-08 ENCOUNTER — Telehealth: Payer: Self-pay | Admitting: Physician Assistant

## 2022-02-08 ENCOUNTER — Telehealth (HOSPITAL_COMMUNITY): Payer: Self-pay | Admitting: *Deleted

## 2022-02-08 MED ORDER — METOPROLOL SUCCINATE ER 50 MG PO TB24: 50.0000 mg | ORAL_TABLET | Freq: Two times a day (BID) | ORAL | 3 refills | Status: DC

## 2022-02-08 NOTE — Telephone Encounter (Signed)
Duplicate

## 2022-02-11 ENCOUNTER — Other Ambulatory Visit (HOSPITAL_COMMUNITY): Payer: Self-pay

## 2022-02-11 ENCOUNTER — Telehealth: Payer: Self-pay | Admitting: Family Medicine

## 2022-02-11 NOTE — Telephone Encounter (Signed)
PT REQUESTING TOC TO DR Salomon Fick

## 2022-02-12 DIAGNOSIS — J9601 Acute respiratory failure with hypoxia: Secondary | ICD-10-CM | POA: Diagnosis not present

## 2022-02-13 ENCOUNTER — Ambulatory Visit (INDEPENDENT_AMBULATORY_CARE_PROVIDER_SITE_OTHER): Payer: Medicare Other | Admitting: Family

## 2022-02-13 ENCOUNTER — Encounter: Payer: Self-pay | Admitting: Family

## 2022-02-13 VITALS — BP 144/84 | HR 78 | Temp 98.2°F | Ht 66.0 in | Wt 258.6 lb

## 2022-02-13 DIAGNOSIS — E042 Nontoxic multinodular goiter: Secondary | ICD-10-CM

## 2022-02-13 DIAGNOSIS — H6123 Impacted cerumen, bilateral: Secondary | ICD-10-CM

## 2022-02-13 DIAGNOSIS — L659 Nonscarring hair loss, unspecified: Secondary | ICD-10-CM

## 2022-02-13 DIAGNOSIS — I10 Essential (primary) hypertension: Secondary | ICD-10-CM

## 2022-02-13 LAB — TSH: TSH: 1.62 u[IU]/mL (ref 0.35–5.50)

## 2022-02-14 ENCOUNTER — Other Ambulatory Visit: Payer: Self-pay | Admitting: Family

## 2022-02-14 ENCOUNTER — Other Ambulatory Visit (HOSPITAL_COMMUNITY): Payer: Self-pay

## 2022-02-14 DIAGNOSIS — L659 Nonscarring hair loss, unspecified: Secondary | ICD-10-CM

## 2022-02-14 NOTE — Progress Notes (Signed)
Established Patient Office Visit  Subjective   Patient ID: [name], female    DOB: 29-Nov-1950  Age: 71 y.o. MRN: [id number]  Chief Complaint  Patient presents with  . Hair/Scalp Problem    Patient complains of hair loss x2-3 months after increasing dosage of Metoprolol to 1.5 daily, states cardiologist advised to decrease to 1 a day, states she is due to for a follow up with endocrinologist, post parathryoid excision and was told a referral is needed since the last visit was 2 years ago    HPI 71 year old female presents with concerns of hair loss over the last 2-3 months. She believes it was triggered when she increased her Metoprolol dosage. She has since contacted her cardiologist to have the dosage adjusted. It has been 3 days she she has been back on 1 tablet twice a day from 1.5 tabs twice a day. She also reports needing to follow-up with endocrinology for multiple  Thyroid nodules. She has not seen them in 2 years so she needs a new referral. Her appointment is scheduled for next week.   Patient also has a concern about her right ear feeling clogged. She has a history of cerumen impactions.   Review of Systems  HENT:         Right ear clogged  Cardiovascular:        Atrial fibrillation  Endo/Heme/Allergies:        Multiple thyroid nodules  All other systems reviewed and are negative.    Objective:     BP (!) 144/84 (BP Location: Left Arm, Patient Position: Sitting, Cuff Size: Large)   Pulse 78   Temp 98.2 F (36.8 C) (Oral)   Ht _0  (1.676 m)   Wt 258 lb 9.6 oz (117.3 kg)   SpO2 98%   BMI 41.74 kg/m    Physical Exam Vitals and nursing note reviewed.  Constitutional:      Appearance: Normal appearance.  HENT:     Head:     Comments:  Informed consent was obtained and peroxide gel was inserted into the ears bilaterally using the lavage kit the ears were lavaged until clean.Inspection with a cerumen spoon removed residual wax. Patient tolerated the  procedure well.     Right Ear: There is impacted cerumen.     Left Ear: There is impacted cerumen.     Nose: Nose normal.  Cardiovascular:     Rate and Rhythm: Normal rate and regular rhythm.  Pulmonary:     Effort: Pulmonary effort is normal.     Breath sounds: Normal breath sounds.  Abdominal:     General: Abdomen is flat.     Palpations: Abdomen is soft.  Musculoskeletal:        General: Normal range of motion.     Cervical back: Normal range of motion and neck supple. No tenderness.  Skin:    General: Skin is warm and dry.     Comments: Hair is shedding   Neurological:     General: No focal deficit present.     Mental Status: She is alert.  Psychiatric:        Mood and Affect: Mood normal.        Behavior: Behavior normal.    Results for orders placed or performed in visit on 02/13/22  TSH  Result Value Ref Range   TSH 1.62 0.35 - 5.50 uIU/mL      The 10-year ASCVD risk score (Arnett DK, et al.,  2019) is: 20.9%    Assessment & Plan:   Problem List Items Addressed This Visit     Multiple thyroid nodules   Relevant Orders   TSH (Completed)   Ambulatory referral to Endocrinology   Hypertension   Other Visit Diagnoses     Hair loss    -  Primary   Relevant Orders   Ambulatory referral to Endocrinology   Bilateral impacted cerumen          Labs obtained today, will notify patient pending results. Endocrinology referral was placed. Ears lavaged. Call the office with any questions or concerns. Recheck as scheduled and sooner as needed.  No follow-ups on file.    Kennyth Arnold, FNP

## 2022-02-20 ENCOUNTER — Other Ambulatory Visit (HOSPITAL_COMMUNITY): Payer: Self-pay

## 2022-02-21 ENCOUNTER — Ambulatory Visit: Payer: Medicare Other | Admitting: Internal Medicine

## 2022-02-21 ENCOUNTER — Other Ambulatory Visit (HOSPITAL_COMMUNITY): Payer: Self-pay

## 2022-02-21 NOTE — Telephone Encounter (Signed)
Ok

## 2022-02-22 NOTE — Telephone Encounter (Signed)
LM for pt to call for scheduling

## 2022-03-03 ENCOUNTER — Telehealth: Payer: Self-pay | Admitting: Family

## 2022-03-03 DIAGNOSIS — R600 Localized edema: Secondary | ICD-10-CM

## 2022-03-05 ENCOUNTER — Other Ambulatory Visit: Payer: Self-pay | Admitting: Family

## 2022-03-05 ENCOUNTER — Other Ambulatory Visit (HOSPITAL_COMMUNITY): Payer: Self-pay

## 2022-03-05 DIAGNOSIS — R600 Localized edema: Secondary | ICD-10-CM

## 2022-03-05 MED ORDER — FUROSEMIDE 20 MG PO TABS
40.0000 mg | ORAL_TABLET | Freq: Two times a day (BID) | ORAL | 0 refills | Status: DC | PRN
  Filled 2022-03-05: qty 100, 17d supply, fill #0

## 2022-03-05 NOTE — Telephone Encounter (Signed)
Pt calling in to check on progress of this refill furosemide (LASIX) 20 MG tablet. Requesting a 30d supply. Patient states she has 1 pill left. Requesting a call with update

## 2022-03-06 ENCOUNTER — Other Ambulatory Visit (HOSPITAL_COMMUNITY): Payer: Self-pay

## 2022-03-07 ENCOUNTER — Other Ambulatory Visit: Payer: Self-pay

## 2022-03-07 DIAGNOSIS — I4819 Other persistent atrial fibrillation: Secondary | ICD-10-CM

## 2022-03-14 DIAGNOSIS — J9601 Acute respiratory failure with hypoxia: Secondary | ICD-10-CM | POA: Diagnosis not present

## 2022-03-20 ENCOUNTER — Other Ambulatory Visit: Payer: Self-pay | Admitting: Family

## 2022-03-20 DIAGNOSIS — R600 Localized edema: Secondary | ICD-10-CM

## 2022-03-22 ENCOUNTER — Other Ambulatory Visit (HOSPITAL_COMMUNITY): Payer: Self-pay

## 2022-03-22 MED ORDER — FUROSEMIDE 20 MG PO TABS
40.0000 mg | ORAL_TABLET | Freq: Two times a day (BID) | ORAL | 0 refills | Status: DC | PRN
  Filled 2022-03-22: qty 100, 17d supply, fill #0

## 2022-03-22 NOTE — Telephone Encounter (Signed)
Pt is call back and stated she need her refill call in today.

## 2022-03-27 ENCOUNTER — Other Ambulatory Visit: Payer: Medicare Other

## 2022-03-28 ENCOUNTER — Other Ambulatory Visit: Payer: Medicare Other

## 2022-03-28 DIAGNOSIS — N183 Chronic kidney disease, stage 3 unspecified: Secondary | ICD-10-CM | POA: Diagnosis not present

## 2022-03-28 DIAGNOSIS — M069 Rheumatoid arthritis, unspecified: Secondary | ICD-10-CM | POA: Diagnosis not present

## 2022-03-28 DIAGNOSIS — E210 Primary hyperparathyroidism: Secondary | ICD-10-CM | POA: Diagnosis not present

## 2022-03-28 DIAGNOSIS — E785 Hyperlipidemia, unspecified: Secondary | ICD-10-CM | POA: Diagnosis not present

## 2022-03-28 DIAGNOSIS — N179 Acute kidney failure, unspecified: Secondary | ICD-10-CM | POA: Diagnosis not present

## 2022-03-28 DIAGNOSIS — I129 Hypertensive chronic kidney disease with stage 1 through stage 4 chronic kidney disease, or unspecified chronic kidney disease: Secondary | ICD-10-CM | POA: Diagnosis not present

## 2022-03-28 DIAGNOSIS — I4819 Other persistent atrial fibrillation: Secondary | ICD-10-CM | POA: Diagnosis not present

## 2022-03-28 DIAGNOSIS — N390 Urinary tract infection, site not specified: Secondary | ICD-10-CM | POA: Diagnosis not present

## 2022-03-28 DIAGNOSIS — R319 Hematuria, unspecified: Secondary | ICD-10-CM | POA: Diagnosis not present

## 2022-03-28 LAB — CBC WITH DIFFERENTIAL/PLATELET
Basophils Absolute: 0 10*3/uL (ref 0.0–0.2)
Basos: 0 %
EOS (ABSOLUTE): 0.2 10*3/uL (ref 0.0–0.4)
Eos: 4 %
Hematocrit: 41.2 % (ref 34.0–46.6)
Hemoglobin: 13.8 g/dL (ref 11.1–15.9)
Lymphocytes Absolute: 1.8 10*3/uL (ref 0.7–3.1)
Lymphs: 31 %
MCH: 28 pg (ref 26.6–33.0)
MCHC: 33.5 g/dL (ref 31.5–35.7)
MCV: 84 fL (ref 79–97)
Monocytes Absolute: 0.6 10*3/uL (ref 0.1–0.9)
Monocytes: 11 %
Neutrophils Absolute: 3.1 10*3/uL (ref 1.4–7.0)
Neutrophils: 54 %
Platelets: 214 10*3/uL (ref 150–450)
RBC: 4.92 x10E6/uL (ref 3.77–5.28)
RDW: 15.8 % — ABNORMAL HIGH (ref 11.7–15.4)
WBC: 5.7 10*3/uL (ref 3.4–10.8)

## 2022-03-28 LAB — BASIC METABOLIC PANEL
BUN/Creatinine Ratio: 18 (ref 12–28)
BUN: 20 mg/dL (ref 8–27)
CO2: 33 mmol/L — ABNORMAL HIGH (ref 20–29)
Calcium: 10 mg/dL (ref 8.7–10.3)
Chloride: 98 mmol/L (ref 96–106)
Creatinine, Ser: 1.13 mg/dL — ABNORMAL HIGH (ref 0.57–1.00)
Glucose: 103 mg/dL — ABNORMAL HIGH (ref 70–99)
Potassium: 3.9 mmol/L (ref 3.5–5.2)
Sodium: 141 mmol/L (ref 134–144)
eGFR: 51 mL/min/{1.73_m2} — ABNORMAL LOW (ref >59)

## 2022-03-29 ENCOUNTER — Other Ambulatory Visit (HOSPITAL_COMMUNITY): Payer: Self-pay

## 2022-04-01 ENCOUNTER — Other Ambulatory Visit (HOSPITAL_COMMUNITY): Payer: Self-pay

## 2022-04-01 ENCOUNTER — Ambulatory Visit (INDEPENDENT_AMBULATORY_CARE_PROVIDER_SITE_OTHER): Payer: Medicare Other | Admitting: Family Medicine

## 2022-04-01 ENCOUNTER — Other Ambulatory Visit (HOSPITAL_COMMUNITY): Payer: Self-pay | Admitting: Nurse Practitioner

## 2022-04-01 ENCOUNTER — Encounter: Payer: Self-pay | Admitting: Family Medicine

## 2022-04-01 VITALS — BP 138/80 | HR 78 | Temp 97.7°F | Ht 66.0 in | Wt 262.2 lb

## 2022-04-01 DIAGNOSIS — I4891 Unspecified atrial fibrillation: Secondary | ICD-10-CM | POA: Insufficient documentation

## 2022-04-01 DIAGNOSIS — I10 Essential (primary) hypertension: Secondary | ICD-10-CM

## 2022-04-01 DIAGNOSIS — I482 Chronic atrial fibrillation, unspecified: Secondary | ICD-10-CM

## 2022-04-01 MED ORDER — METOPROLOL SUCCINATE ER 50 MG PO TB24
50.0000 mg | ORAL_TABLET | Freq: Two times a day (BID) | ORAL | 1 refills | Status: DC
  Filled 2022-04-01: qty 90, 45d supply, fill #0
  Filled 2022-05-31 (×2): qty 90, 45d supply, fill #1
  Filled 2022-07-15: qty 90, 45d supply, fill #2
  Filled 2022-08-28: qty 90, 45d supply, fill #3
  Filled 2022-10-10: qty 90, 45d supply, fill #4

## 2022-04-01 MED ORDER — HYDROXYCHLOROQUINE SULFATE 200 MG PO TABS
ORAL_TABLET | ORAL | 1 refills | Status: DC
  Filled 2022-04-01: qty 180, 90d supply, fill #0
  Filled 2022-06-28: qty 180, 90d supply, fill #1

## 2022-04-01 NOTE — Progress Notes (Signed)
Established Patient Office Visit  Subjective   Patient ID: [name], female    DOB: 06/08/1951  Age: 71 y.o. MRN: [id number]  Chief Complaint  Patient presents with   Establish Care    Patient was seen in June due to hair loss. States that her metoprolol was increased before she noticed the hair loss. States that this problem is improving, thinks that it is related to the metoprolol.  Paroxysmal a fib-- patient has an ablation procedure scheduled on 04/15/22. She reports she occasionally gets palpitations but states that the metoprolol does a good job controlling her heart rate. She denies any chest pain, dizziness, no headaches.  Lower extremity edema-- pt reports she takes 3 tablets of lasix 20 mg in the morning and 2 tablets in the afternoon. States that this is controlling her BLE edema, states she was told she had venous insufficiency and is wearing her compression stocking regularly. Denies any SOB or orthopnea. States that the level of swelling today is her baseline swelling.  Pt reports that since being on the eliquis she has noticed some spotting. States she has a history of chronic constipation and hemorrhoids, not sure if it is coming from that or from the vagina. She denies any other associated symptoms or issues.    Patient Active Problem List   Diagnosis Date Noted   Atrial fibrillation ([place]) 04/01/2022   Snoring 01/02/2022   Lung nodule 10/11/2021   COVID-19 virus infection 10/10/2021   Hypokalemia 10/10/2021   Hyponatremia 10/10/2021   Acute respiratory failure with hypoxia (HCC) 10/10/2021   Prolonged QT interval 10/10/2021   Multiple thyroid nodules 97/35/3299   Helicobacter pylori gastritis 10/05/2018   Morbid obesity ([place]) 05/12/2018   Hyperglycemia 05/12/2018   Lower extremity edema -chronic 05/12/2018   Iron deficiency anemia 05/12/2018   Erythromelalgia ([place]) 10/17/2017   Hyperparathyroidism ([place]) 05/06/2017   Lumbar stenosis with neurogenic  claudication 10/11/2016   H/O breast augmentation 09/12/2016   L-S radiculopathy 05/30/2016   Neck mass 05/30/2015   Hx of colonic polyp - ssp 11/03/2014   Neuropathy 07/06/2014   Hyperlipidemia 06/02/2014   Allergic rhinitis    Osteoporosis 10/17/2011   Rheumatoid arthritis ([place])    Hypertension      Current Outpatient Medications  Medication Instructions   apixaban (ELIQUIS) 5 MG TABS tablet Take 1 tablet by mouth 2  times daily.   ascorbic acid (VITAMIN C) 500 mg, Oral, Daily   diclofenac sodium (VOLTAREN) 2 g, Topical, 4 times daily PRN   fexofenadine (ALLEGRA) 180 mg, Oral, Daily PRN   furosemide (LASIX) 20 MG tablet Take 2 - 3 tablets by mouth 2 times daily as needed for edema. (need appt for refills)   gabapentin (NEURONTIN) 100 MG capsule TAKE 3 CAPSULES IN THE MORNING AND 2 CAPSULES AT BEDTIME. OK TO TAKE EXTRA CAPSULE AT BEDTIME AS NEEDED   hydroxychloroquine (PLAQUENIL) 200 MG tablet Take 1 tablet by mouth with food or milk twice a day   hydroxychloroquine (PLAQUENIL) 200 MG tablet Take 1 tablet by mouth 2 times a day with food or milk   hydroxypropyl methylcellulose / hypromellose (ISOPTO TEARS / GONIOVISC) 2.5 % ophthalmic solution 1 drop, Both Eyes, Daily   losartan (COZAAR) 100 MG tablet TAKE 1 TABLET BY MOUTH ONCE A DAY   Menthol, Topical Analgesic, (BIOFREEZE) 4 % GEL 1 application , Apply externally, Daily PRN   metoprolol succinate (TOPROL-XL) 50 mg, Oral, 2 times daily, Take with or immediately following a meal.  metoprolol succinate (TOPROL-XL) 50 mg, Oral, 2 times daily   metoprolol tartrate (LOPRESSOR) 25 MG tablet Take 1 tablet  by mouth 3 times daily as needed (for heart rate greater than 110 bpm).   potassium chloride SA (KLOR-CON M) 20 MEQ tablet Take 1 tablet by mouth 2 times daily.   sulfaSALAzine (AZULFIDINE) 500 MG tablet Take 2 tablets by mouth twice a day.   Vitamin D 5,000 Units, Oral, Daily     Review of Systems  Constitutional:  Negative for  chills, diaphoresis, fever and malaise/fatigue.  Cardiovascular:  Positive for leg swelling (chronic, ongoing). Negative for chest pain, palpitations and orthopnea.  Genitourinary:  Negative for hematuria.  All other systems reviewed and are negative.     Objective:     BP 138/80 (BP Location: Left Arm, Patient Position: Sitting, Cuff Size: Large)   Pulse 78   Temp 97.7 F (36.5 C) (Oral)   Ht '5\' 6"'  (1.676 m)   Wt 262 lb 3.2 oz (118.9 kg)   SpO2 98%   BMI 42.32 kg/m  BP Readings from Last 3 Encounters:  04/01/22 138/80  02/13/22 (!) 144/84  01/08/22 136/88      Physical Exam Vitals reviewed.  Constitutional:      Appearance: Normal appearance. She is well-groomed. She is obese.  HENT:     Head: Normocephalic and atraumatic.  Eyes:     Extraocular Movements: Extraocular movements intact.     Pupils: Pupils are equal, round, and reactive to light.  Cardiovascular:     Rate and Rhythm: Normal rate and regular rhythm.     Pulses: Normal pulses.     Heart sounds: S1 normal and S2 normal.  Pulmonary:     Effort: Pulmonary effort is normal.     Breath sounds: Normal breath sounds and air entry.  Abdominal:     General: Abdomen is flat. Bowel sounds are normal.     Palpations: Abdomen is soft.  Musculoskeletal:        General: Normal range of motion.     Cervical back: Normal range of motion and neck supple.     Right lower leg: Edema (BL 1+ pitting edema to the mid calf) present.     Left lower leg: Edema present.  Skin:    General: Skin is warm and dry.  Neurological:     Mental Status: She is alert and oriented to person, place, and time. Mental status is at baseline.     Gait: Gait is intact.  Psychiatric:        Mood and Affect: Mood and affect normal.        Speech: Speech normal.        Behavior: Behavior normal.        Judgment: Judgment normal.      No results found for any visits on 04/01/22.  Last metabolic panel Lab Results  Component Value Date    GLUCOSE 103 (H) 03/28/2022   NA 141 03/28/2022   K 3.9 03/28/2022   CL 98 03/28/2022   CO2 33 (H) 03/28/2022   BUN 20 03/28/2022   CREATININE 1.13 (H) 03/28/2022   EGFR 51 (L) 03/28/2022   CALCIUM 10.0 03/28/2022   PHOS 2.3 (L) 10/11/2021   PROT 6.5 11/05/2021   ALBUMIN 3.8 11/05/2021   BILITOT 0.3 11/05/2021   ALKPHOS 55 11/05/2021   AST 13 11/05/2021   ALT 13 11/05/2021   ANIONGAP 9 10/15/2021   Last hemoglobin A1c Lab Results  Component Value  Date   HGBA1C 5.6 05/29/2021      The 10-year ASCVD risk score (Arnett DK, et al., 2019) is: 19.3%    Assessment & Plan:   Problem List Items Addressed This Visit       Cardiovascular and Mediastinum   Hypertension - Primary    BP is at goal today, I reviewed her medications Current hypertension medications:       Sig   furosemide (LASIX) 20 MG tablet (Taking) Take 2 - 3 tablets by mouth 2 times daily as needed for edema. (need appt for refills)   losartan (COZAAR) 100 MG tablet (Taking) TAKE 1 TABLET BY MOUTH ONCE A DAY   metoprolol succinate (TOPROL-XL) 50 MG 24 hr tablet (Taking) Take 1 tablet (50 mg total) by mouth 2 (two) times daily. Take with or immediately following a meal.     Continue these medications.      Atrial fibrillation (HCC) (Chronic)    Paroxysmal, on eliquis 5 mg BID and metoprolol 50 mg BID for this. Her HR is regular and controlled today. She is going for ablation at the end of the month. I advised her to continue monitoring her blood pressure at home and to try and determine where the spotting is coming from that she reported today.       Return in about 6 months (around 10/02/2022) for 6 month follow up.    Farrel Conners, MD

## 2022-04-01 NOTE — Patient Instructions (Signed)
I encourage daily weights to track fluid balance. If you gain more then 2 pounds in 24 hours or 5 pounds in 1 week then you might consider increasing your lasix briefly.

## 2022-04-01 NOTE — Assessment & Plan Note (Signed)
BP is at goal today, I reviewed her medications Current hypertension medications:      Sig   furosemide (LASIX) 20 MG tablet (Taking) Take 2 - 3 tablets by mouth 2 times daily as needed for edema. (need appt for refills)   losartan (COZAAR) 100 MG tablet (Taking) TAKE 1 TABLET BY MOUTH ONCE A DAY   metoprolol succinate (TOPROL-XL) 50 MG 24 hr tablet (Taking) Take 1 tablet (50 mg total) by mouth 2 (two) times daily. Take with or immediately following a meal.     Continue these medications.

## 2022-04-01 NOTE — Assessment & Plan Note (Signed)
Paroxysmal, on eliquis 5 mg BID and metoprolol 50 mg BID for this. Her HR is regular and controlled today. She is going for ablation at the end of the month. I advised her to continue monitoring her blood pressure at home and to try and determine where the spotting is coming from that she reported today.

## 2022-04-02 ENCOUNTER — Other Ambulatory Visit (HOSPITAL_COMMUNITY): Payer: Self-pay

## 2022-04-03 ENCOUNTER — Other Ambulatory Visit (HOSPITAL_COMMUNITY): Payer: Self-pay

## 2022-04-04 ENCOUNTER — Other Ambulatory Visit (HOSPITAL_COMMUNITY): Payer: Self-pay

## 2022-04-05 ENCOUNTER — Other Ambulatory Visit: Payer: Self-pay | Admitting: *Deleted

## 2022-04-05 ENCOUNTER — Other Ambulatory Visit (HOSPITAL_COMMUNITY): Payer: Self-pay

## 2022-04-05 ENCOUNTER — Other Ambulatory Visit: Payer: Self-pay

## 2022-04-05 DIAGNOSIS — R0683 Snoring: Secondary | ICD-10-CM

## 2022-04-05 DIAGNOSIS — Z01812 Encounter for preprocedural laboratory examination: Secondary | ICD-10-CM

## 2022-04-05 DIAGNOSIS — I4819 Other persistent atrial fibrillation: Secondary | ICD-10-CM

## 2022-04-05 DIAGNOSIS — R400 Somnolence: Secondary | ICD-10-CM

## 2022-04-05 MED ORDER — CEPHALEXIN 250 MG PO CAPS
250.0000 mg | ORAL_CAPSULE | Freq: Two times a day (BID) | ORAL | 0 refills | Status: DC
  Filled 2022-04-05: qty 6, 3d supply, fill #0

## 2022-04-05 MED ORDER — APIXABAN 5 MG PO TABS
5.0000 mg | ORAL_TABLET | Freq: Two times a day (BID) | ORAL | 4 refills | Status: DC
  Filled 2022-04-05: qty 60, 30d supply, fill #0
  Filled 2022-05-09: qty 60, 30d supply, fill #1
  Filled 2022-06-06: qty 60, 30d supply, fill #2
  Filled 2022-06-28 – 2022-07-03 (×2): qty 60, 30d supply, fill #3
  Filled 2022-08-06: qty 60, 30d supply, fill #4

## 2022-04-05 NOTE — Telephone Encounter (Signed)
Prescription refill request for Eliquis received.  Indication: afib  Last office visit: 01/08/2022, Quentin Ore Scr: 1.13, 03/28/2022 Age: 71 yo  Weight: 118.9 kg   Refill sent.

## 2022-04-10 ENCOUNTER — Other Ambulatory Visit: Payer: Self-pay | Admitting: Family Medicine

## 2022-04-10 ENCOUNTER — Telehealth (HOSPITAL_COMMUNITY): Payer: Self-pay | Admitting: Emergency Medicine

## 2022-04-10 DIAGNOSIS — R600 Localized edema: Secondary | ICD-10-CM

## 2022-04-10 NOTE — Telephone Encounter (Signed)
Attempted to call patient regarding upcoming cardiac CT appointment. °Left message on voicemail with name and callback number °Amauris Debois RN Navigator Cardiac Imaging °[facility] °[phone] Office °[phone] Cell ° °

## 2022-04-11 ENCOUNTER — Other Ambulatory Visit: Payer: Medicare Other

## 2022-04-11 ENCOUNTER — Other Ambulatory Visit (HOSPITAL_COMMUNITY): Payer: Self-pay

## 2022-04-11 ENCOUNTER — Encounter (HOSPITAL_BASED_OUTPATIENT_CLINIC_OR_DEPARTMENT_OTHER): Payer: Self-pay

## 2022-04-11 ENCOUNTER — Ambulatory Visit (HOSPITAL_BASED_OUTPATIENT_CLINIC_OR_DEPARTMENT_OTHER)
Admission: RE | Admit: 2022-04-11 | Discharge: 2022-04-11 | Disposition: A | Discharge status: Home / Self Care | Payer: Medicare Other | Source: Ambulatory Visit | Attending: Cardiology | Admitting: Cardiology

## 2022-04-11 DIAGNOSIS — I4811 Longstanding persistent atrial fibrillation: Secondary | ICD-10-CM | POA: Diagnosis not present

## 2022-04-11 DIAGNOSIS — R319 Hematuria, unspecified: Secondary | ICD-10-CM | POA: Diagnosis not present

## 2022-04-11 MED ORDER — FUROSEMIDE 20 MG PO TABS
40.0000 mg | ORAL_TABLET | Freq: Two times a day (BID) | ORAL | 0 refills | Status: DC | PRN
  Filled 2022-04-11: qty 100, 17d supply, fill #0

## 2022-04-11 MED ORDER — IOHEXOL 350 MG/ML SOLN
100.0000 mL | Freq: Once | INTRAVENOUS | Status: AC | PRN
  Administered 2022-04-11: 80 mL via INTRAVENOUS

## 2022-04-12 ENCOUNTER — Other Ambulatory Visit (HOSPITAL_COMMUNITY): Payer: Self-pay

## 2022-04-12 ENCOUNTER — Telehealth: Payer: Self-pay | Admitting: Cardiology

## 2022-04-12 NOTE — Telephone Encounter (Signed)
  Pt said, she lost her paperwork where her instructions for her upcoming ablation. She said she tried looking on her mychart as well but couldn't find it. She would like to speak with RN Otila Kluver.

## 2022-04-14 DIAGNOSIS — J9601 Acute respiratory failure with hypoxia: Secondary | ICD-10-CM | POA: Diagnosis not present

## 2022-04-15 ENCOUNTER — Other Ambulatory Visit (HOSPITAL_COMMUNITY): Payer: Self-pay

## 2022-04-15 ENCOUNTER — Telehealth: Payer: Self-pay | Admitting: Cardiology

## 2022-04-15 NOTE — Telephone Encounter (Signed)
Patient is calling stating she currently has a UTI with spotting. She is wanting to confirm this won't interfere with her procedure that is scheduled for tomorrow morning. Please advise.

## 2022-04-15 NOTE — Telephone Encounter (Signed)
UTI started a couple of weeks ago. She was treated with 3 days of antibiotics. Patient went back 8/24 for a follow up visit and they redid urine test but she does not have the results yet. The patient stated that she has not had symptoms and never has had any symptoms from the start. Advised the patient that spotting would not effect the procedure timeline.   Will call MD Kruska's office.  Left message for a call back on MD assistance voicemail.   Spoke to [facility] office and preliminary reviewed by staff noted, "lots of bacteria and elevated white cells, finial urine culture not back."  Spoke to MD Quentin Ore about information above,  verbalized okay to continue as planned with the procedure tomorrow if she has no fever or chills.   Confirmed again with the patient no fever, chills or symptoms. Advised okay to continue as planned.  Patient verbalized understanding.

## 2022-04-15 NOTE — Pre-Procedure Instructions (Signed)
Instructed patient on the following items: Arrival time 0830 Nothing to eat or drink after midnight No meds AM of procedure Responsible person to drive you home and stay with you for 24 hrs  Have you missed any doses of anti-coagulant Eliquis- hasn't missed any doses   

## 2022-04-16 ENCOUNTER — Other Ambulatory Visit (HOSPITAL_COMMUNITY): Payer: Self-pay

## 2022-04-16 ENCOUNTER — Encounter (HOSPITAL_COMMUNITY)
Admission: RE | Disposition: A | Discharge status: Home / Self Care | Payer: Self-pay | Source: Home / Self Care | Attending: Cardiology

## 2022-04-16 ENCOUNTER — Encounter (HOSPITAL_COMMUNITY): Payer: Self-pay | Admitting: Cardiology

## 2022-04-16 ENCOUNTER — Ambulatory Visit (HOSPITAL_COMMUNITY): Payer: Medicare Other | Admitting: Anesthesiology

## 2022-04-16 ENCOUNTER — Other Ambulatory Visit: Payer: Self-pay

## 2022-04-16 ENCOUNTER — Ambulatory Visit (HOSPITAL_BASED_OUTPATIENT_CLINIC_OR_DEPARTMENT_OTHER): Payer: Medicare Other | Admitting: Anesthesiology

## 2022-04-16 ENCOUNTER — Ambulatory Visit (HOSPITAL_COMMUNITY)
Admission: RE | Admit: 2022-04-16 | Discharge: 2022-04-16 | Disposition: A | Discharge status: Home / Self Care | Payer: Medicare Other | Attending: Cardiology | Admitting: Cardiology

## 2022-04-16 DIAGNOSIS — I5032 Chronic diastolic (congestive) heart failure: Secondary | ICD-10-CM | POA: Insufficient documentation

## 2022-04-16 DIAGNOSIS — I739 Peripheral vascular disease, unspecified: Secondary | ICD-10-CM

## 2022-04-16 DIAGNOSIS — K219 Gastro-esophageal reflux disease without esophagitis: Secondary | ICD-10-CM | POA: Diagnosis not present

## 2022-04-16 DIAGNOSIS — I110 Hypertensive heart disease with heart failure: Secondary | ICD-10-CM | POA: Insufficient documentation

## 2022-04-16 DIAGNOSIS — I4891 Unspecified atrial fibrillation: Secondary | ICD-10-CM | POA: Diagnosis not present

## 2022-04-16 DIAGNOSIS — I10 Essential (primary) hypertension: Secondary | ICD-10-CM

## 2022-04-16 DIAGNOSIS — Z79899 Other long term (current) drug therapy: Secondary | ICD-10-CM | POA: Insufficient documentation

## 2022-04-16 DIAGNOSIS — I4819 Other persistent atrial fibrillation: Secondary | ICD-10-CM | POA: Diagnosis not present

## 2022-04-16 DIAGNOSIS — Z87891 Personal history of nicotine dependence: Secondary | ICD-10-CM | POA: Insufficient documentation

## 2022-04-16 DIAGNOSIS — R600 Localized edema: Secondary | ICD-10-CM | POA: Diagnosis not present

## 2022-04-16 DIAGNOSIS — R0602 Shortness of breath: Secondary | ICD-10-CM | POA: Insufficient documentation

## 2022-04-16 DIAGNOSIS — M069 Rheumatoid arthritis, unspecified: Secondary | ICD-10-CM | POA: Insufficient documentation

## 2022-04-16 HISTORY — PX: ATRIAL FIBRILLATION ABLATION: EP1191

## 2022-04-16 LAB — POCT ACTIVATED CLOTTING TIME: Activated Clotting Time: 335 seconds

## 2022-04-16 SURGERY — ATRIAL FIBRILLATION ABLATION
Anesthesia: General

## 2022-04-16 MED ORDER — DEXAMETHASONE SODIUM PHOSPHATE 10 MG/ML IJ SOLN
INTRAMUSCULAR | Status: DC | PRN
  Administered 2022-04-16: 5 mg via INTRAVENOUS

## 2022-04-16 MED ORDER — ROCURONIUM BROMIDE 10 MG/ML (PF) SYRINGE
PREFILLED_SYRINGE | INTRAVENOUS | Status: DC | PRN
  Administered 2022-04-16: 30 mg via INTRAVENOUS
  Administered 2022-04-16: 60 mg via INTRAVENOUS

## 2022-04-16 MED ORDER — FENTANYL CITRATE (PF) 100 MCG/2ML IJ SOLN
INTRAMUSCULAR | Status: DC | PRN
  Administered 2022-04-16: 50 ug via INTRAVENOUS

## 2022-04-16 MED ORDER — ACETAMINOPHEN 500 MG PO TABS
1000.0000 mg | ORAL_TABLET | Freq: Once | ORAL | Status: AC
  Administered 2022-04-16: 1000 mg via ORAL
  Filled 2022-04-16: qty 2

## 2022-04-16 MED ORDER — PROPOFOL 500 MG/50ML IV EMUL
INTRAVENOUS | Status: DC | PRN
  Administered 2022-04-16: 25 ug/kg/min via INTRAVENOUS

## 2022-04-16 MED ORDER — HEPARIN SODIUM (PORCINE) 1000 UNIT/ML IJ SOLN
INTRAMUSCULAR | Status: DC | PRN
  Administered 2022-04-16: 1000 [IU] via INTRAVENOUS

## 2022-04-16 MED ORDER — LACTATED RINGERS IV SOLN: INTRAVENOUS | Status: DC | PRN

## 2022-04-16 MED ORDER — SODIUM CHLORIDE 0.9% FLUSH: 3.0000 mL | INTRAVENOUS | Status: DC | PRN

## 2022-04-16 MED ORDER — HEPARIN SODIUM (PORCINE) 1000 UNIT/ML IJ SOLN
INTRAMUSCULAR | Status: DC | PRN
  Administered 2022-04-16: 18000 [IU] via INTRAVENOUS
  Administered 2022-04-16: 3000 [IU] via INTRAVENOUS

## 2022-04-16 MED ORDER — PROTAMINE SULFATE 10 MG/ML IV SOLN
INTRAVENOUS | Status: DC | PRN
  Administered 2022-04-16: 30 mg via INTRAVENOUS

## 2022-04-16 MED ORDER — PANTOPRAZOLE SODIUM 40 MG PO TBEC
40.0000 mg | DELAYED_RELEASE_TABLET | Freq: Every day | ORAL | Status: DC
  Administered 2022-04-16: 40 mg via ORAL
  Filled 2022-04-16: qty 1

## 2022-04-16 MED ORDER — AMOXICILLIN 500 MG PO CAPS
ORAL_CAPSULE | ORAL | 0 refills | Status: DC
  Filled 2022-04-16: qty 6, 3d supply, fill #0

## 2022-04-16 MED ORDER — HEPARIN SODIUM (PORCINE) 1000 UNIT/ML IJ SOLN
INTRAMUSCULAR | Status: AC
Start: 2022-04-16 — End: ?
  Filled 2022-04-16: qty 10

## 2022-04-16 MED ORDER — APIXABAN 5 MG PO TABS
5.0000 mg | ORAL_TABLET | Freq: Two times a day (BID) | ORAL | Status: DC
  Administered 2022-04-16: 5 mg via ORAL
  Filled 2022-04-16: qty 1

## 2022-04-16 MED ORDER — COLCHICINE 0.6 MG PO TABS
0.6000 mg | ORAL_TABLET | Freq: Two times a day (BID) | ORAL | Status: DC
Start: 2022-04-16 — End: 2022-04-16
  Administered 2022-04-16: 0.6 mg via ORAL
  Filled 2022-04-16: qty 1

## 2022-04-16 MED ORDER — PHENYLEPHRINE HCL-NACL 20-0.9 MG/250ML-% IV SOLN
INTRAVENOUS | Status: DC | PRN
  Administered 2022-04-16: 40 ug/min via INTRAVENOUS

## 2022-04-16 MED ORDER — PROPOFOL 10 MG/ML IV BOLUS
INTRAVENOUS | Status: DC | PRN
  Administered 2022-04-16: 150 mg via INTRAVENOUS

## 2022-04-16 MED ORDER — LIDOCAINE 2% (20 MG/ML) 5 ML SYRINGE
INTRAMUSCULAR | Status: DC | PRN
  Administered 2022-04-16: 100 mg via INTRAVENOUS

## 2022-04-16 MED ORDER — SUGAMMADEX SODIUM 200 MG/2ML IV SOLN
INTRAVENOUS | Status: DC | PRN
  Administered 2022-04-16: 200 mg via INTRAVENOUS

## 2022-04-16 MED ORDER — HEPARIN (PORCINE) IN NACL 1000-0.9 UT/500ML-% IV SOLN
INTRAVENOUS | Status: DC | PRN
  Administered 2022-04-16 (×3): 500 mL

## 2022-04-16 MED ORDER — ACETAMINOPHEN 325 MG PO TABS: 650.0000 mg | ORAL_TABLET | ORAL | Status: DC | PRN

## 2022-04-16 MED ORDER — PHENYLEPHRINE 80 MCG/ML (10ML) SYRINGE FOR IV PUSH (FOR BLOOD PRESSURE SUPPORT)
PREFILLED_SYRINGE | INTRAVENOUS | Status: DC | PRN
  Administered 2022-04-16: 80 ug via INTRAVENOUS

## 2022-04-16 MED ORDER — SODIUM CHLORIDE 0.9 % IV SOLN: INTRAVENOUS | Status: DC

## 2022-04-16 MED ORDER — HEPARIN (PORCINE) IN NACL 1000-0.9 UT/500ML-% IV SOLN
INTRAVENOUS | Status: AC
Start: 2022-04-16 — End: ?
  Filled 2022-04-16: qty 1500

## 2022-04-16 MED ORDER — SODIUM CHLORIDE 0.9 % IV SOLN: 250.0000 mL | INTRAVENOUS | Status: DC | PRN

## 2022-04-16 MED ORDER — ONDANSETRON HCL 4 MG/2ML IJ SOLN
INTRAMUSCULAR | Status: DC | PRN
  Administered 2022-04-16: 4 mg via INTRAVENOUS

## 2022-04-16 MED ORDER — COLCHICINE 0.6 MG PO TABS
0.6000 mg | ORAL_TABLET | Freq: Two times a day (BID) | ORAL | 0 refills | Status: DC
  Filled 2022-04-16: qty 10, 5d supply, fill #0

## 2022-04-16 MED ORDER — SODIUM CHLORIDE 0.9% FLUSH: 3.0000 mL | Freq: Two times a day (BID) | INTRAVENOUS | Status: DC

## 2022-04-16 MED ORDER — PANTOPRAZOLE SODIUM 40 MG PO TBEC
40.0000 mg | DELAYED_RELEASE_TABLET | Freq: Every day | ORAL | 0 refills | Status: DC
  Filled 2022-04-16: qty 45, 45d supply, fill #0

## 2022-04-16 SURGICAL SUPPLY — 18 items
CATH 8FR REPROCESSED SOUNDSTAR (CATHETERS) ×1 IMPLANT
CATH 8FR SOUNDSTAR REPROCESSED (CATHETERS) IMPLANT
CATH ABLAT QDOT MICRO BI TC DF (CATHETERS) IMPLANT
CATH OCTARAY 2.0 F 3-3-3-3-3 (CATHETERS) IMPLANT
CATH S CIRCA THERM PROBE 10F (CATHETERS) IMPLANT
CATH WEB BI DIR CSDF CRV REPRO (CATHETERS) IMPLANT
CLOSURE PERCLOSE PROSTYLE (VASCULAR PRODUCTS) IMPLANT
COVER SWIFTLINK CONNECTOR (BAG) ×1 IMPLANT
PACK EP LATEX FREE (CUSTOM PROCEDURE TRAY) ×1
PACK EP LF (CUSTOM PROCEDURE TRAY) ×1 IMPLANT
PAD DEFIB RADIO PHYSIO CONN (PAD) ×1 IMPLANT
PATCH CARTO3 (PAD) IMPLANT
SHEATH BAYLIS TRANSSEPTAL 98CM (NEEDLE) IMPLANT
SHEATH CARTO VIZIGO SM CVD (SHEATH) IMPLANT
SHEATH PINNACLE 8F 10CM (SHEATH) IMPLANT
SHEATH PINNACLE 9F 10CM (SHEATH) IMPLANT
SHEATH PROBE COVER 6X72 (BAG) IMPLANT
TUBING SMART ABLATE COOLFLOW (TUBING) IMPLANT

## 2022-04-16 NOTE — Anesthesia Procedure Notes (Addendum)
Procedure Name: Intubation Date/Time: 04/16/2022 10:58 AM  Performed by: Kyung Rudd, CRNAPre-anesthesia Checklist: Patient identified, Emergency Drugs available, Suction available and Patient being monitored Patient Re-evaluated:Patient Re-evaluated prior to induction Oxygen Delivery Method: Circle System Utilized Preoxygenation: Pre-oxygenation with 100% oxygen Induction Type: IV induction Ventilation: Mask ventilation without difficulty Laryngoscope Size: Glidescope and 3 Tube type: Oral Tube size: 7.0 mm Number of attempts: 1 Airway Equipment and Method: Stylet Placement Confirmation: ETT inserted through vocal cords under direct vision, positive ETCO2 and breath sounds checked- equal and bilateral Secured at: 22 cm Tube secured with: Tape Dental Injury: Teeth and Oropharynx as per pre-operative assessment  Comments: Electively used Glidescope Go due to oral opening. Grade I view on Glidescope screen. AOI +ETCO2 and BBS=.

## 2022-04-16 NOTE — Anesthesia Postprocedure Evaluation (Signed)
Anesthesia Post Note  Patient: [name]  Procedure(s) Performed: ATRIAL FIBRILLATION ABLATION     Patient location during evaluation: PACU Anesthesia Type: General Level of consciousness: awake and alert Pain management: pain level controlled Vital Signs Assessment: post-procedure vital signs reviewed and stable Respiratory status: spontaneous breathing, nonlabored ventilation, respiratory function stable and patient connected to nasal cannula oxygen Cardiovascular status: blood pressure returned to baseline and stable Postop Assessment: no apparent nausea or vomiting Anesthetic complications: no   No notable events documented.  Last Vitals:  Vitals:   04/16/22 1500 04/16/22 1530  BP: 110/83 (!) 143/63  Pulse: 73 72  Resp: 16 11  Temp:    SpO2: 95% 91%    Last Pain:  Vitals:   04/16/22 1350  TempSrc: Temporal  PainSc:                  [name]

## 2022-04-16 NOTE — Anesthesia Preprocedure Evaluation (Signed)
Anesthesia Evaluation  Patient identified by MRN, date of birth, ID band Patient awake    Reviewed: Allergy & Precautions, NPO status , Patient's Chart, lab work & pertinent test results  History of Anesthesia Complications (+) PONV and history of anesthetic complications  Airway Mallampati: III  TM Distance: >3 FB Neck ROM: Full    Dental  (+) Dental Advisory Given, Partial Upper, Partial Lower, Caps,    Pulmonary former smoker,    Pulmonary exam normal breath sounds clear to auscultation       Cardiovascular hypertension, Pt. on home beta blockers and Pt. on medications (-) angina+ Peripheral Vascular Disease  (-) Past MI Normal cardiovascular exam+ dysrhythmias Atrial Fibrillation  Rhythm:Regular Rate:Normal     Neuro/Psych  Neuromuscular disease    GI/Hepatic Neg liver ROS, GERD  Medicated,  Endo/Other  Morbid obesity  Renal/GU negative Renal ROS     Musculoskeletal  (+) Arthritis , Rheumatoid disorders,    Abdominal   Peds  Hematology  (+) Blood dyscrasia (Eliquis), ,   Anesthesia Other Findings Day of surgery medications reviewed with the patient.  Reproductive/Obstetrics                             Anesthesia Physical Anesthesia Plan  ASA: 3  Anesthesia Plan: General   Post-op Pain Management: Tylenol PO (pre-op)*   Induction: Intravenous  PONV Risk Score and Plan: 4 or greater and Propofol infusion, Dexamethasone and Ondansetron  Airway Management Planned: Oral ETT  Additional Equipment:   Intra-op Plan:   Post-operative Plan: Extubation in OR  Informed Consent: I have reviewed the patients History and Physical, chart, labs and discussed the procedure including the risks, benefits and alternatives for the proposed anesthesia with the patient or authorized representative who has indicated his/her understanding and acceptance.     Dental advisory given  Plan  Discussed with: CRNA  Anesthesia Plan Comments:         Anesthesia Quick Evaluation

## 2022-04-16 NOTE — Progress Notes (Signed)
Report given to Jo,RN who will assume care at this time.  Pt VSS. No bleeding or complications

## 2022-04-16 NOTE — H&P (Signed)
Electrophysiology Office Note:     Date:  04/16/2022    ID:  Christine, [name] Dec 19, 1950, MRN [id number]   PCP:  Caren Macadam, MD       [facility] Cardiologist:  None  [facility] Electrophysiologist:  Vickie Epley, MD    Referring MD: Sherran Needs, NP    Chief Complaint: New patient consult for Afib ablation   History of Present Illness:     [name] is a 71 y.o. female who presents for an evaluation for consideration of Afib ablation at the request of Roderic Palau, NP. Their medical history includes hypertension, hypercalcemia, erythromelalgia, GERD, rheumatoid arthritis, neuropathy, and shingles.   Prior hospitalization in February at which time she was found to be in atrial fibrillation with a prolonged QT over 500 ms; no prior history of Afib. Of note, she is on plaquenil for RA, which is contraindicated with most antiarrhythmics due to qt prolonging potential. She is on eliquis 5 mg bid, metoprolol succinate 50 mg bid.    She saw Roderic Palau, NP on 11/27/2021. She had a failed cardioversion 11/22/21 but was back in SR at that visit with qt 488 ms. She felt that she was in Afib intermittently per fluctuations on her Pulse OX. She had chronic LLE swelling (adjusts furosemide as needed), and her activity was limited due to lumbar stenosis neuropathy. Metoprolol succinate was increased to 75 mg BID. She was referred to EP to discuss front line ablation.   She is accompanied by a family member. Overall, she does not believe she can tell when she is out of rhythm. However, she will occasionally feel dizzy, anxious, and/or nauseous. Sometimes she will hear a heart sound that she describes as similar to an "electrical charge."   She confirms having rheumatoid arthritis that is well controlled. Her neuropathy is more bothersome. She used to be active. Lately she is able to walk around the house or climb a flight of stairs if she needs to, but not for very long  periods. Soon she needs to bend forwards while walking due to pain.    Every day she notices swelling, frequently "jumping" between her right and left LE.   Last week she was tested for sleep apnea and reportedly has negative results per her MyChart. However she notes that she only slept for about 4 hours at the time. She describes herself as a "shallow breather" at baseline.    She denies any chest pain, or shortness of breath. No headaches, syncope, orthopnea, or PND.    She presents for PVI today.    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   Osteopenia 10/17/2011    DEXA 09/2007: -1.4 L fem; 10/2011: -1.2 L fem    Osteoporosis     PONV (  postoperative nausea and vomiting)     Pseudogout of foot     Rheumatoid arthritis(714.0) dx 2010   Shingles      hx of    Tibial plateau fracture, right, closed, initial encounter 07/16/2017           Past Surgical History:  Procedure Laterality Date   BREAST BIOPSY   1972   Broken wrist   2010   CARDIOVERSION N/A 11/22/2021    Procedure: CARDIOVERSION;  Surgeon: Geralynn Rile, MD;  Location: [place];  Service: Cardiovascular;  Laterality: N/A;   CATARACT EXTRACTION   03/2012    left   COLONOSCOPY       COSMETIC SURGERY       FRACTURE SURGERY       INNER EAR SURGERY        busted ear drum   MAXIMUM ACCESS (MAS)POSTERIOR LUMBAR INTERBODY FUSION (PLIF) 1 LEVEL N/A 10/11/2016    Procedure: Lumbar one-Sacral one  Maximum access posterior lumbar interbody fusion;  Surgeon: Erline Levine, MD;  Location: [place];  Service: Neurosurgery;  Laterality: N/A;   ORIF WRIST FRACTURE Right 07/18/2017    Procedure: OPEN REDUCTION INTERNAL FIXATION (ORIF) WRIST FRACTURE;  Surgeon: Renette Butters, MD;  Location: [place];  Service: Orthopedics;  Laterality: Right;   PARATHYROIDECTOMY Left 04/12/2019    Procedure: LEFT SUPERIOR PARATHYROIDECTOMY;  Surgeon: [name], MD;  Location: [facility];  Service: General;  Laterality: Left;   pneumonia   2007      Current Medications: Active Medications      Current Meds  Medication Sig   apixaban (ELIQUIS) 5 MG TABS tablet Take 1 tablet by mouth 2  times daily.   Cholecalciferol (VITAMIN D) 125 MCG (5000 UT) CAPS Take 5,000 Units by mouth daily.   diclofenac sodium (VOLTAREN) 1 % GEL Apply 2 g topically 4 (four) times daily as needed (pain).   fexofenadine (ALLEGRA) 180 MG tablet Take 180 mg by mouth daily as needed (allergies).   furosemide (LASIX) 20 MG tablet Take 2 - 3 tablets by mouth 2 times daily as needed for edema. (need appt for refills)   gabapentin (NEURONTIN) 100 MG capsule TAKE 3 CAPSULES IN THE MORNING AND 2 CAPSULES AT BEDTIME. OK TO TAKE EXTRA CAPSULE AT BEDTIME AS NEEDED   hydroxychloroquine (PLAQUENIL) 200 MG tablet Take 1 tablet by mouth with food or milk twice a day   hydroxypropyl methylcellulose / hypromellose (ISOPTO TEARS / GONIOVISC) 2.5 % ophthalmic solution Place 1 drop into both eyes daily.   losartan (COZAAR) 100 MG tablet TAKE 1 TABLET BY MOUTH ONCE A DAY   Menthol, Topical Analgesic, (BIOFREEZE) 4 % GEL Apply 1 application. topically daily as needed (pain).   metoprolol succinate (TOPROL-XL) 50 MG 24 hr tablet Take 1 and 1/2 tablets (75 mg total) by mouth 2 (two) times daily.   metoprolol tartrate (LOPRESSOR) 25 MG tablet Take 1 tablet  by mouth 3 times daily as needed (for heart rate greater than 110 bpm).   potassium chloride SA (KLOR-CON M) 20  MEQ tablet Take 1 tablet by mouth 2 times daily.   sulfaSALAzine (AZULFIDINE) 500 MG tablet Take 2 tablets by mouth twice a day.   vitamin C (ASCORBIC ACID) 500 MG tablet Take 500 mg by mouth daily.        Allergies:   Amlodipine besylate, Depakote [divalproex sodium], Nortriptyline, Crestor [rosuvastatin], Dilaudid [hydromorphone hcl], and Tramadol    Social History  Socioeconomic History   Marital status: Married      Spouse name: Not on file   Number of children: 3   Years of education: Not on file   Highest education level: Not on file  Occupational History   Occupation: Retired  Tobacco Use   Smoking status: Former      Packs/day: 4.00      Years: 4.00      Pack years: 16.00      Types: Cigarettes      Start date: 53      Quit date: 1985      Years since quitting: 38.4   Smokeless tobacco: Never  Vaping Use   Vaping Use: Never used  Substance and Sexual Activity   Alcohol use: No      Alcohol/week: 0.0 standard drinks   Drug use: No   Sexual activity: Not on file  Other Topics Concern   Not on file  Social History Narrative    Artist -retired Building control surveyor    Married, lives with spouse, [name], he is IT support for [employer]    3 sons    2 caffeinated beverages a day    No regular exercise, diet is ok    Right handed    Social Determinants of Health    Financial Resource Strain: Not on file  Food Insecurity: Not on file  Transportation Needs: Not on file  Physical Activity: Not on file  Stress: Not on file  Social Connections: Not on file      Family History: The patient's family history includes Heart attack in her father; Hypertension in her brother, father, and sister; Hyperthyroidism in her sister; Hypothyroidism in her brother; Stroke in her mother. There is no history of Colon cancer, Esophageal cancer, Rectal cancer, or Stomach cancer.   ROS:   Please see the history of present illness.    (+) Dizziness (+)  Anxiety/Nausea (+) Arthralgias (+) Neuropathy (+) Strange heart sounds (+) Bilateral LE edema All other systems reviewed and are negative.   EKGs/Labs/Other Studies Reviewed:     The following studies were reviewed today:   10/14/2021  Echo  1. Left ventricular ejection fraction, by estimation, is 55 to 60%. The  left ventricle has normal function. The left ventricle has no regional  wall motion abnormalities. The left ventricular internal cavity size was  mildly dilated. There is mild left  ventricular hypertrophy. Left ventricular diastolic function could not be  evaluated.   2. Right ventricular systolic function is mildly reduced. The right  ventricular size is mildly enlarged. There is mildly elevated pulmonary  artery systolic pressure.   3. The mitral valve is normal in structure. Mild mitral valve  regurgitation. No evidence of mitral stenosis.   4. The aortic valve is tricuspid. Aortic valve regurgitation is trivial.  Aortic valve sclerosis is present, with no evidence of aortic valve  stenosis.   5. Aortic dilatation noted. There is mild dilatation of the aortic root,  measuring 41 mm. There is mild dilatation of the ascending aorta,  measuring 41 mm.   6. The inferior vena cava is dilated in size with >50% respiratory  variability, suggesting right atrial pressure of 8 mmHg.    10/10/2021 CTA Chest FINDINGS: Cardiovascular: Satisfactory opacification of the pulmonary arteries to the segmental level. No evidence of pulmonary embolism. There is mild cardiomegaly with mild coronary artery calcification. No pericardial effusion.   Mediastinum/Nodes: No enlarged mediastinal, hilar, or axillary lymph nodes. Thyroid  gland, trachea, and esophagus demonstrate no significant findings.   Lungs/Pleura: Marked severity multifocal infiltrates are seen throughout both lungs.   Very small bilateral pleural effusions are noted.   No pneumothorax is identified.   Upper  Abdomen: No acute abnormality.   Musculoskeletal: A nondisplaced fracture deformity of the right transverse process is seen at the level of T1 (axial CT image 9, CT series 6).   Multilevel degenerative changes seen throughout the thoracic spine.   Review of the MIP images confirms the above findings.   IMPRESSION: 1. Marked severity bilateral multifocal infiltrates. 2. Very small bilateral pleural effusions. 3. Nondisplaced fracture deformity of the right transverse process at the level of T1.   07/20/2019  ABI Doppler Waveforms may be blunted due to significant pedal edema.     Summary:  Right: Resting right ankle-brachial index is within normal range. No  evidence of significant right lower extremity arterial disease.   Left: Resting left ankle-brachial index is within normal range. No  evidence of significant left lower extremity arterial disease.        EKG:   EKG is personally reviewed.  EKG was not ordered.       Recent Labs: 10/10/2021: B Natriuretic Peptide 428.4 10/13/2021: Magnesium 2.1 11/05/2021: ALT 13; TSH 2.52 11/19/2021: BUN 16; Creatinine, Ser 1.11; Hemoglobin 11.8; Platelets 215; Potassium 3.9; Sodium 137    Recent Lipid Panel Labs (Brief)          Component Value Date/Time    CHOL 160 08/29/2021 1352    CHOL 225 (H) 06/21/2014 1146    TRIG 149.0 08/29/2021 1352    TRIG 169 (H) 06/21/2014 1146    HDL 42.30 08/29/2021 1352    HDL 48 06/21/2014 1146    CHOLHDL 4 08/29/2021 1352    VLDL 29.8 08/29/2021 1352    LDLCALC 88 08/29/2021 1352    LDLCALC 158 (H) 03/31/2020 0845    LDLCALC 143 (H) 06/21/2014 1146    LDLDIRECT 153.0 05/29/2021 1109        Physical Exam:     VS:  BP 150/63   Pulse 73   Ht '5\' 6"'$  (1.676 m)   Wt 261 lb 6.4 oz (118.6 kg)   SpO2 91%   BMI 42.19 kg/m         Wt Readings from Last 3 Encounters:  01/08/22 261 lb 6.4 oz (118.6 kg)  01/02/22 260 lb (117.9 kg)  11/27/21 255 lb 3.2 oz (115.8 kg)      GEN: Well nourished,  well developed in no acute distress; In wheelchair..  Obese HEENT: Normal NECK: No JVD; No carotid bruits LYMPHATICS: No lymphadenopathy CARDIAC: Irregularly irregular no murmurs, rubs, gallops RESPIRATORY:  Clear to auscultation without rales, wheezing or rhonchi  ABDOMEN: Soft, non-tender, non-distended MUSCULOSKELETAL:  Trace-1+ pitting edema; No deformity  SKIN: Warm and dry NEUROLOGIC:  Alert and oriented x 3 PSYCHIATRIC:  Normal affect          Assessment ASSESSMENT:     1. Persistent atrial fibrillation ([place])   2. Chronic diastolic heart failure ([place])   3. Primary hypertension     PLAN:     In order of problems listed above:   #Persistent atrial fibrillation Symptomatic.  Chronic diastolic heart failure.  Edema and shortness of breath with exertion.  Rhythm control is indicated.  Failed cardioversion in the past.  I discussed treatment options including antiarrhythmic drug therapy and catheter ablation.  She would need to consider transitioning off Plaquenil to initiate  antiarrhythmic drug therapy.  I discussed catheter ablation in detail with the patient and her husband during today's visit including the risks, recovery and likelihood of success.  I discussed the possibility of needing repeat ablation procedures or antiarrhythmic drug therapy after an initial ablation procedure.  She would like to think about her options and will let us know if she would like to schedule which I think is very reasonable.   Risk, benefits, and alternatives to EP study and radiofrequency ablation for afib were also discussed in detail today. These risks include but are not limited to stroke, bleeding, vascular damage, tamponade, perforation, damage to the esophagus, lungs, and other structures, pulmonary vein stenosis, worsening renal function, and death. Carto, ICE, anesthesia are requested for the procedure.  Will also obtain CT PV protocol prior to the procedure to exclude LAA thrombus and  further evaluate atrial anatomy.   #Chronic diastolic heart failure NYHA class II-III.  Warm and slightly volume overloaded on exam.  Continue Lasix. Rhythm control as above.  She presents for PVI today. Procedure reviewed.   Signed, Hilton Cork. Quentin Ore, MD, [facility], [facility] 04/16/2022 Electrophysiology [facility]

## 2022-04-16 NOTE — Discharge Instructions (Addendum)
Post procedure care instructions No driving for 4 days. No lifting over 5 lbs for 1 week. No vigorous or sexual activity for 1 week. You may return to work/your usual activities on 04/24/22. Keep procedure site clean & dry. If you notice increased pain, swelling, bleeding or pus, call/return!  You may shower after 24 hours, but no soaking in baths/hot tubs/pools for 1 week.    You have an appointment set up with the [facility].  Multiple studies have shown that being followed by a dedicated atrial fibrillation clinic in addition to the standard care you receive from your other physicians improves health. We believe that enrollment in the atrial fibrillation clinic will allow [place] to better care for you.   The phone number to the [facility] is [phone]. The clinic is staffed Monday through Friday from 8:30am to 5pm.  Parking Directions: The clinic is located in the Heart and Vascular Building connected to [facility]. 1)From [address] turn on to [address] and go to the 3rd entrance  (Heart and Vascular entrance) on the right. 2)Look to the right for Heart &Vascular Parking Garage. 3)A code for the entrance is required, for Sept is 1502   4)Take the elevators to the 1st floor. Registration is in the room with the glass walls at the end of the hallway.  If you have any trouble parking or locating the clinic, please don't hesitate to call [phone].       Cardiac Ablation, Care After  This sheet gives you information about how to care for yourself after your procedure. Your health care provider may also give you more specific instructions. If you have problems or questions, contact your health care provider. What can I expect after the procedure? After the procedure, it is common to have: Bruising around your puncture site. Tenderness around your puncture site. Skipped heartbeats. Tiredness (fatigue).  Follow these instructions at  home: Puncture site care  Follow instructions from your health care provider about how to take care of your puncture site. Make sure you: If present, leave stitches (sutures), skin glue, or adhesive strips in place. These skin closures may need to stay in place for up to 2 weeks. If adhesive strip edges start to loosen and curl up, you may trim the loose edges. Do not remove adhesive strips completely unless your health care provider tells you to do that. If a large square bandage is present, this may be removed 24 hours after surgery.  Check your puncture site every day for signs of infection. Check for: Redness, swelling, or pain. Fluid or blood. If your puncture site starts to bleed, lie down on your back, apply firm pressure to the area, and contact your health care provider. Warmth. Pus or a bad smell. A pea or small marble sized lump at the site is normal and can take up to three months to resolve.  Driving Do not drive for at least 4 days after your procedure or however long your health care provider recommends. (Do not resume driving if you have previously been instructed not to drive for other health reasons.) Do not drive or use heavy machinery while taking prescription pain medicine. Activity Avoid activities that take a lot of effort for at least 7 days after your procedure. Do not lift anything that is heavier than 5 lb (4.5 kg) for one week.  No sexual activity for 1 week.  Return to your normal activities as told by your health care  provider. Ask your health care provider what activities are safe for you. General instructions Take over-the-counter and prescription medicines only as told by your health care provider. Do not use any products that contain nicotine or tobacco, such as cigarettes and e-cigarettes. If you need help quitting, ask your health care provider. You may shower after 24 hours, but Do not take baths, swim, or use a hot tub for 1 week.  Do not drink alcohol for  24 hours after your procedure. Keep all follow-up visits as told by your health care provider. This is important. Contact a health care provider if: You have redness, mild swelling, or pain around your puncture site. You have fluid or blood coming from your puncture site that stops after applying firm pressure to the area. Your puncture site feels warm to the touch. You have pus or a bad smell coming from your puncture site. You have a fever. You have chest pain or discomfort that spreads to your neck, jaw, or arm. You are sweating a lot. You feel nauseous. You have a fast or irregular heartbeat. You have shortness of breath. You are dizzy or light-headed and feel the need to lie down. You have pain or numbness in the arm or leg closest to your puncture site. Get help right away if: Your puncture site suddenly swells. Your puncture site is bleeding and the bleeding does not stop after applying firm pressure to the area. These symptoms may represent a serious problem that is an emergency. Do not wait to see if the symptoms will go away. Get medical help right away. Call your local emergency services (911 in the [place] Do not drive yourself to the hospital. Summary After the procedure, it is normal to have bruising and tenderness at the puncture site in your groin, neck, or forearm. Check your puncture site every day for signs of infection. Get help right away if your puncture site is bleeding and the bleeding does not stop after applying firm pressure to the area. This is a medical emergency. This information is not intended to replace advice given to you by your health care provider. Make sure you discuss any questions you have with your health care provider.

## 2022-04-16 NOTE — Transfer of Care (Signed)
Immediate Anesthesia Transfer of Care Note  Patient: [name]  Procedure(s) Performed: ATRIAL FIBRILLATION ABLATION  Patient Location: Cath Lab  Anesthesia Type:General  Level of Consciousness: awake, alert  and oriented  Airway & Oxygen Therapy: Patient Spontanous Breathing and Patient connected to nasal cannula oxygen  Post-op Assessment: Report given to RN, Post -op Vital signs reviewed and stable and Patient moving all extremities  Post vital signs: Reviewed and stable  Last Vitals:  Vitals Value Taken Time  BP 118/74 04/16/22 1251  Temp    Pulse 72 04/16/22 1252  Resp 15 04/16/22 1252  SpO2 95 % 04/16/22 1252  Vitals shown include unvalidated device data.  Last Pain:  Vitals:   04/16/22 0958  TempSrc:   PainSc: 0-No pain         Complications: No notable events documented.

## 2022-04-17 ENCOUNTER — Encounter (HOSPITAL_COMMUNITY): Payer: Self-pay | Admitting: Cardiology

## 2022-04-19 ENCOUNTER — Telehealth: Payer: Self-pay | Admitting: Cardiology

## 2022-04-19 NOTE — Telephone Encounter (Signed)
Returned call to patient. Her groin bandage is still in place and she wanted to know if she can remove it.  I had her remove the dressing while on the phone.  The site is clean dry, no redness or swelling.   Adv of post procedure instructions to keep site clean and dry, no soaking.  Will call if any other concerns.  Thanked me for call.

## 2022-04-19 NOTE — Telephone Encounter (Signed)
  Pt would like to speak with Otila Kluver, she said she has question about her recent procedure

## 2022-04-24 ENCOUNTER — Telehealth: Payer: Self-pay

## 2022-04-24 NOTE — Telephone Encounter (Signed)
Pt notified that she can have an AWV done once a calendar year. Confirms that she just started Medicare this year, so appt would need to be with Dr Legrand Como as a [facility] appt.   Pt declines to set up appt today, states she will call back to schedule.

## 2022-04-30 ENCOUNTER — Other Ambulatory Visit (HOSPITAL_COMMUNITY): Payer: Self-pay

## 2022-04-30 ENCOUNTER — Other Ambulatory Visit: Payer: Self-pay | Admitting: Family Medicine

## 2022-04-30 DIAGNOSIS — R600 Localized edema: Secondary | ICD-10-CM

## 2022-04-30 MED ORDER — FUROSEMIDE 20 MG PO TABS
40.0000 mg | ORAL_TABLET | Freq: Two times a day (BID) | ORAL | 0 refills | Status: DC | PRN
  Filled 2022-04-30: qty 100, 17d supply, fill #0

## 2022-05-01 ENCOUNTER — Other Ambulatory Visit (HOSPITAL_COMMUNITY): Payer: Self-pay

## 2022-05-09 ENCOUNTER — Other Ambulatory Visit (HOSPITAL_COMMUNITY): Payer: Self-pay

## 2022-05-14 ENCOUNTER — Ambulatory Visit (HOSPITAL_COMMUNITY): Payer: Medicare Other | Admitting: Nurse Practitioner

## 2022-05-14 ENCOUNTER — Telehealth: Payer: Self-pay | Admitting: Family Medicine

## 2022-05-14 DIAGNOSIS — N950 Postmenopausal bleeding: Secondary | ICD-10-CM

## 2022-05-14 NOTE — Telephone Encounter (Signed)
Pt is calling and discuss with dr Legrand Como she is having vaginal bleeding and would like a referral to gyn female provider

## 2022-05-14 NOTE — Telephone Encounter (Signed)
Spoke with the patient, informed her the referral entered as below and someone will contact her with appt information.

## 2022-05-14 NOTE — Telephone Encounter (Signed)
Ok to send referral to GYN under the diagnosis of postmenopausal bleeding.

## 2022-05-15 DIAGNOSIS — J9601 Acute respiratory failure with hypoxia: Secondary | ICD-10-CM | POA: Diagnosis not present

## 2022-05-21 ENCOUNTER — Other Ambulatory Visit: Payer: Self-pay | Admitting: Family Medicine

## 2022-05-21 ENCOUNTER — Other Ambulatory Visit (HOSPITAL_COMMUNITY): Payer: Self-pay

## 2022-05-21 DIAGNOSIS — R600 Localized edema: Secondary | ICD-10-CM

## 2022-05-21 MED ORDER — FUROSEMIDE 20 MG PO TABS
40.0000 mg | ORAL_TABLET | Freq: Two times a day (BID) | ORAL | 1 refills | Status: DC | PRN
  Filled 2022-05-21: qty 100, 17d supply, fill #0
  Filled 2022-06-12: qty 100, 17d supply, fill #1

## 2022-05-22 ENCOUNTER — Ambulatory Visit: Payer: Medicare Other | Admitting: Internal Medicine

## 2022-05-22 ENCOUNTER — Other Ambulatory Visit (HOSPITAL_COMMUNITY): Payer: Self-pay

## 2022-05-30 ENCOUNTER — Ambulatory Visit (HOSPITAL_COMMUNITY)
Admission: RE | Admit: 2022-05-30 | Discharge: 2022-05-30 | Disposition: A | Discharge status: Home / Self Care | Payer: Medicare Other | Source: Ambulatory Visit | Attending: Nurse Practitioner | Admitting: Nurse Practitioner

## 2022-05-30 ENCOUNTER — Encounter (HOSPITAL_COMMUNITY): Payer: Self-pay | Admitting: Nurse Practitioner

## 2022-05-30 VITALS — BP 172/60 | HR 77 | Ht 66.0 in | Wt 265.8 lb

## 2022-05-30 DIAGNOSIS — I482 Chronic atrial fibrillation, unspecified: Secondary | ICD-10-CM | POA: Diagnosis not present

## 2022-05-30 DIAGNOSIS — I4891 Unspecified atrial fibrillation: Secondary | ICD-10-CM | POA: Diagnosis not present

## 2022-05-30 NOTE — Progress Notes (Addendum)
Primary Care Physician: Farrel Conners, MD Referring Physician: Dr. Maury Dus [name] is a 71 y.o. female with a h/o afib that is in the afib clinic one month after afib ablation. She has not noted any afib. No swallowing or groin issues.   She is frustrated as she is in a lot  of  arthritic pain and cannot take antiinflammatories.  I encouraged her to ask about the watchman on f/u with Dr. Quentin Ore  Today, she denies symptoms of palpitations, chest pain, shortness of breath, orthopnea, PND, lower extremity edema, dizziness, presyncope, syncope, or neurologic sequela. The patient is tolerating medications without difficulties and is otherwise without complaint today.   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/2011   Osteopenia 10/17/2011   DEXA 09/2007: -1.4 L fem; 10/2011: -1.2 L fem    Osteoporosis    PONV (postoperative nausea and vomiting)    Pseudogout of foot    Rheumatoid arthritis(714.0) dx 2010   Shingles    hx of    Tibial plateau fracture, right, closed, initial encounter 07/16/2017   Past Surgical History:  Procedure Laterality Date   ATRIAL FIBRILLATION ABLATION N/A 04/16/2022   Procedure: ATRIAL FIBRILLATION ABLATION;  Surgeon: Vickie Epley, MD;  Location: [facility];  Service: Cardiovascular;  Laterality: N/A;   BREAST BIOPSY  1972   Broken wrist  2010   CARDIOVERSION N/A 11/22/2021   Procedure: CARDIOVERSION;  Surgeon: Geralynn Rile, MD;   Location: [facility];  Service: Cardiovascular;  Laterality: N/A;   CATARACT EXTRACTION  03/2012   left   COLONOSCOPY     COSMETIC SURGERY     FRACTURE SURGERY     INNER EAR SURGERY     busted ear drum   MAXIMUM ACCESS (MAS)POSTERIOR LUMBAR INTERBODY FUSION (PLIF) 1 LEVEL N/A 10/11/2016   Procedure: Lumbar one-Sacral one Maximum access posterior lumbar interbody fusion;  Surgeon: Erline Levine, MD;  Location: [facility];  Service: Neurosurgery;  Laterality: N/A;   ORIF WRIST FRACTURE Right 07/18/2017   Procedure: OPEN REDUCTION INTERNAL FIXATION (ORIF) WRIST FRACTURE;  Surgeon: Renette Butters, MD;  Location: [facility];  Service: Orthopedics;  Laterality: Right;   PARATHYROIDECTOMY Left 04/12/2019   Procedure: LEFT SUPERIOR PARATHYROIDECTOMY;  Surgeon: [name], MD;  Location: [facility];  Service: General;  Laterality: Left;   pneumonia  2007    Current Outpatient Medications  Medication Sig Dispense Refill   acetaminophen (TYLENOL) 325 MG tablet Take 650 mg by mouth daily. Arthritis     apixaban (ELIQUIS) 5 MG TABS tablet Take 1 tablet by mouth 2  times daily. 60 tablet 4   BIOTIN PO Take 1 tablet by mouth daily.     Cholecalciferol (VITAMIN D) 125 MCG (5000 UT) CAPS Take 5,000 Units by mouth daily.     diclofenac sodium (VOLTAREN) 1 % GEL Apply 2 g topically 4 (  four) times daily as needed (pain).     fexofenadine (ALLEGRA) 180 MG tablet Take 180 mg by mouth daily as needed (allergies).     furosemide (LASIX) 20 MG tablet Take 2 - 3 tablets by mouth 2 times daily as needed for edema. (need appt for refills) 100 tablet 1   gabapentin (NEURONTIN) 100 MG capsule TAKE 3 CAPSULES IN THE MORNING AND 2 CAPSULES AT BEDTIME. OK TO TAKE EXTRA CAPSULE AT BEDTIME AS NEEDED 540 capsule 3   hydroxychloroquine (PLAQUENIL) 200 MG tablet Take 1 tablet by mouth 2 times a day with food or milk 180 tablet 1   hydroxypropyl methylcellulose / hypromellose (ISOPTO TEARS / GONIOVISC) 2.5 % ophthalmic solution Place 1-2  drops into both eyes daily.     losartan (COZAAR) 100 MG tablet TAKE 1 TABLET BY MOUTH ONCE A DAY 90 tablet 1   Menthol, Topical Analgesic, (BIOFREEZE) 4 % GEL Apply 1 application. topically daily as needed (pain).     metoprolol succinate (TOPROL-XL) 50 MG 24 hr tablet Take 1 tablet (50 mg total) by mouth 2 (two) times daily. 270 tablet 1   metoprolol tartrate (LOPRESSOR) 25 MG tablet Take 1 tablet  by mouth 3 times daily as needed (for heart rate greater than 110 bpm). 90 tablet 3   Multiple Vitamins-Minerals (MULTIVITAMIN WITH MINERALS) tablet Take 2 tablets by mouth daily.     pantoprazole (PROTONIX) 40 MG tablet Take 1 tablet (40 mg total) by mouth daily. 45 tablet 0   potassium chloride SA (KLOR-CON M) 20 MEQ tablet Take 1 tablet by mouth 2 times daily. 180 tablet 1   sulfaSALAzine (AZULFIDINE) 500 MG tablet Take 2 tablets by mouth twice a day. 360 tablet 1   vitamin C (ASCORBIC ACID) 500 MG tablet Take 500 mg by mouth daily.     No current facility-administered medications for this encounter.    Allergies  Allergen Reactions   Amlodipine Besylate Other (See Comments)    Tremors   Depakote [Divalproex Sodium] Swelling   Nortriptyline Nausea And Vomiting and Other (See Comments)    tremors   Crestor [Rosuvastatin]     Muscle aches, pains. Resolved with stopping medication.    Dilaudid [Hydromorphone Hcl]     Headache, muscle tightness   Tramadol     Felt "stoned"    Social History   Socioeconomic History   Marital status: Married    Spouse name: Not on file   Number of children: 3   Years of education: Not on file   Highest education level: 11th grade  Occupational History   Occupation: Retired  Tobacco Use   Smoking status: Former    Packs/day: 4.00    Years: 4.00    Total pack years: 16.00    Types: Cigarettes    Start date: 61    Quit date: 1985    Years since quitting: 38.8   Smokeless tobacco: Never  Vaping Use   Vaping Use: Never used  Substance and  Sexual Activity   Alcohol use: No    Alcohol/week: 0.0 standard drinks of alcohol   Drug use: No   Sexual activity: Not on file  Other Topics Concern   Not on file  Social History Narrative   Artist -retired Building control surveyor   Married, lives with spouse, [name], he is IT support for [employer]   3 sons   2 caffeinated beverages a day   No regular exercise, diet is ok   Right handed  Social Determinants of Health   Financial Resource Strain: Low Risk  (02/12/2022)   Overall Financial Resource Strain (CARDIA)    Difficulty of Paying Living Expenses: Not hard at all  Food Insecurity: No Food Insecurity (02/12/2022)   Hunger Vital Sign    Worried About Running Out of Food in the Last Year: Never true    Ran Out of Food in the Last Year: Never true  Transportation Needs: No Transportation Needs (02/12/2022)   PRAPARE - Hydrologist (Medical): No    Lack of Transportation (Non-Medical): No  Physical Activity: Unknown (02/12/2022)   Exercise Vital Sign    Days of Exercise per Week: 0 days    Minutes of Exercise per Session: Not on file  Stress: No Stress Concern Present (02/12/2022)   [place]    Feeling of Stress : Not at all  Social Connections: Unknown (02/12/2022)   Social Connection and Isolation Panel [NHANES]    Frequency of Communication with Friends and Family: More than three times a week    Frequency of Social Gatherings with Friends and Family: More than three times a week    Attends Religious Services: Patient refused    Active Member of Clubs or Organizations: No    Attends Music therapist: Not on file    Marital Status: Married  Human resources officer Violence: Not on file    Family History  Problem Relation Age of Onset   Stroke Mother    Hypertension Father    Heart attack Father    Hypertension Sister        x 3   Hypertension Brother     Hyperthyroidism Sister        x2, s/p RAI ablation   Hypothyroidism Brother    Colon cancer Neg Hx    Esophageal cancer Neg Hx    Rectal cancer Neg Hx    Stomach cancer Neg Hx     ROS- All systems are reviewed and negative except as per the HPI above  Physical Exam: Vitals:   05/30/22 1011  BP: (!) 172/60  Pulse: 77  Weight: 120.6 kg  Height: '5\' 6"'$  (1.676 m)   Wt Readings from Last 3 Encounters:  05/30/22 120.6 kg  04/16/22 118.8 kg  04/01/22 118.9 kg    Labs: Lab Results  Component Value Date   NA 141 03/28/2022   K 3.9 03/28/2022   CL 98 03/28/2022   CO2 33 (H) 03/28/2022   GLUCOSE 103 (H) 03/28/2022   BUN 20 03/28/2022   CREATININE 1.13 (H) 03/28/2022   CALCIUM 10.0 03/28/2022   PHOS 2.3 (L) 10/11/2021   MG 2.1 10/13/2021   Lab Results  Component Value Date   INR 1.05 07/18/2017   Lab Results  Component Value Date   CHOL 160 08/29/2021   HDL 42.30 08/29/2021   LDLCALC 88 08/29/2021   TRIG 149.0 08/29/2021     GEN- The patient is well appearing, alert and oriented x 3 today.   Head- normocephalic, atraumatic Eyes-  Sclera clear, conjunctiva pink Ears- hearing intact Oropharynx- clear Neck- supple, no JVP Lymph- no cervical lymphadenopathy Lungs- Clear to ausculation bilaterally, normal work of breathing Heart- Regular rate and rhythm, no murmurs, rubs or gallops, PMI not laterally displaced GI- soft, NT, ND, + BS Extremities- no clubbing, cyanosis, or edema MS- no significant deformity or atrophy Skin- no rash or lesion Psych- euthymic mood, full affect Neuro- strength  and sensation are intact  EKG-Vent. rate 77 BPM PR interval 212 ms QRS duration 108 ms QT/QTcB 410/463 ms P-R-T axes 64 -18 37 Sinus rhythm with 1st degree A-V block Otherwise normal ECG When compared with ECG of 16-Apr-2022 16:51, PREVIOUS ECG IS PRESENT    Assessment and Plan:  1. Afib  One month s/p ablation  Doing well staying in SR Continue metoprolol succinate  25 mg bid   2. CHA2DS2VASc score of 3 Continue eliquis 5 mg bid   3. Arthritic  pain  Frustrated that she can not take antiinflammatories  with ongoing anticoagulation for risk of GI bleed  Encouraged to discuss with Dr. Quentin Ore a Watchman procedure  Pamphlet given to pt explaining device   F/u with Dr. Quentin Ore 11/27  Geroge Baseman. Rilee [place]oll, [facility] [address] [place], [place] [address] [phone]

## 2022-05-31 ENCOUNTER — Other Ambulatory Visit (HOSPITAL_COMMUNITY): Payer: Self-pay

## 2022-06-07 ENCOUNTER — Other Ambulatory Visit (HOSPITAL_COMMUNITY): Payer: Self-pay

## 2022-06-12 ENCOUNTER — Other Ambulatory Visit (HOSPITAL_COMMUNITY): Payer: Self-pay

## 2022-06-12 ENCOUNTER — Encounter: Payer: Self-pay | Admitting: Internal Medicine

## 2022-06-12 ENCOUNTER — Telehealth (INDEPENDENT_AMBULATORY_CARE_PROVIDER_SITE_OTHER): Payer: Medicare Other | Admitting: Internal Medicine

## 2022-06-12 DIAGNOSIS — E213 Hyperparathyroidism, unspecified: Secondary | ICD-10-CM

## 2022-06-12 DIAGNOSIS — E042 Nontoxic multinodular goiter: Secondary | ICD-10-CM

## 2022-06-12 DIAGNOSIS — E559 Vitamin D deficiency, unspecified: Secondary | ICD-10-CM | POA: Diagnosis not present

## 2022-06-12 DIAGNOSIS — M810 Age-related osteoporosis without current pathological fracture: Secondary | ICD-10-CM

## 2022-06-12 NOTE — Progress Notes (Deleted)
Patient ID: WHITNEE ORZEL, female   DOB: 03-11-1951, 71 y.o.   MRN: [id number]   Patient location: Home My location: Office Persons participating in the virtual visit: patient, provider  Referring Provider: Farrel Conners, MD  I connected with the patient on 06/12/22 at  2:40 PM EDT by a video enabled telemedicine application and verified that I am speaking with the correct person.   I discussed the limitations of evaluation and management by telemedicine and the availability of in person appointments. The patient expressed understanding and agreed to proceed.   Details of the encounter are shown below.  HPI  ADELLA MANOLIS is a 71 y.o.-year-old female presenting for follow-up for primary hyperparathyroidism, osteoporosis, vitamin D deficiency and now also thyroid nodules.  Last visit 11 mo ago. She is the wife of Cela Newcom.    Interim history: She continues to have hair loss, back pain. No falls or fractures since last visit.  Pt has had hypercalcemia at least since 2014.  She was then diagnosed with primary hyperparathyroidism.  Reviewed pertinent labs: Lab Results  Component Value Date   PTH 43 07/30/2021   PTH Comment 07/30/2021   PTH 122 (H) 05/29/2021   PTH 69 (H) 07/30/2018   PTH 66 (H) 04/03/2017   CALCIUM 10.0 03/28/2022   CALCIUM 9.4 11/19/2021   CALCIUM 9.3 11/05/2021   CALCIUM 8.9 10/15/2021   CALCIUM 9.1 10/14/2021   CALCIUM 8.6 (L) 10/13/2021   CALCIUM 8.5 (L) 10/12/2021   CALCIUM 7.8 (L) 10/11/2021   CALCIUM 8.6 (L) 10/10/2021   CALCIUM 9.9 08/29/2021  She takes biotin as a part of the B complex for peripheral neuropathy and erythromelalgia.  Calcitriol, magnesium, phosphorus were normal: Component     Latest Ref Rng & Units 07/30/2018  Vitamin D 1, 25 (OH) Total     18 - 72 pg/mL 69  Vitamin D3 1, 25 (OH)     pg/mL 69  Vitamin D2 1, 25 (OH)     pg/mL <8  Magnesium     1.5 - 2.5 mg/dL 1.9  Phosphorus     2.3 - 4.6 mg/dL 2.3   I referred  her to see Dr. 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 Findings suggestive of multinodular goiter. 2. Nodule #1 meets imaging criteria to recommend percutaneous sampling as clinically indicated. 3. Nodule #5 meets imaging criteria to recommend a 1 year follow-up. 4. Punctate nodules adjacent to the inferior aspect of the left lobe of the thyroid (labeled 7 and 8), both appear external to the thyroid gland and thus may represent non pathologically enlarged cervical lymph nodes versus parathyroid adenomas. Further evaluation with contrast-enhanced neck CT and/or nuclear medicine parathyroid scintigraphy could be performed as indicated. FNA (02/04/2019): Atypia of unknown significance (Bethesda category 3.  Afirma: Benign. 4D CT (02/04/2019): 1. Best imaging candidate for hyperparathyroidism is a 7 mm nodule behind the interpolar left thyroid. 2. Enlarged and multinodular right thyroid lobe which may account for the scintigraphic appearance. Left superior parathyroidectomy (Dr. 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 - Right tibial plateau fracture 06/2017 (fell down the stairs) - Right  forearm 06/2017 (fell down the stairs) - Left maxillary fracture 06/2017 (fell down the stairs) - Right radial fracture 06/2017 (fell down the stairs)  No history of kidney stones.  No CKD. Last BUN/Cr: Lab Results  Component Value Date   BUN 20 03/28/2022   BUN 16 11/19/2021   CREATININE 1.13 (H) 03/28/2022   CREATININE 1.11 (H) 11/19/2021   She was on HCTZ in the past, but not recently.  Currently on Lasix.  Vitamin D deficiency:  Patient had a very low vitamin D level in 04/2017, but this normalized on 5000 units vitamin D daily : Lab Results  Component Value Date   VD25OH 40.47 05/29/2021   VD25OH 39 03/31/2020   VD25OH 36.01 02/10/2019   VD25OH 43.86 07/30/2018   VD25OH 42.24 03/04/2018   VD25OH 14.17 (L) 05/05/2017   VD25OH 51 05/14/2013   No family history of hypercalcemia, pituitary tumors, medullary thyroid cancer or osteoporosis. She has FH of thyroid ds in 2 sisters and 1 brother.  Her older son and younger sister had kidney stones.  Pt. also has a history of RA- on Plaquenil.  Previously on prednisone taper. She also has erythromelalgia, B12 deficiency - on B12 supplement, HL.  ROS: Skin: no rashes, + hair loss  I reviewed pt's medications, allergies, PMH, social hx, family hx, and changes were documented in the history of present illness. Otherwise, unchanged from my initial visit note.  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   Osteopenia 10/17/2011   DEXA  09/2007: -1.4 L fem; 10/2011: -1.2 L fem    Osteoporosis    PONV (postoperative nausea and vomiting)    Pseudogout of foot    Rheumatoid arthritis(714.0) dx 2010   Shingles    hx of    Tibial plateau fracture, right, closed, initial encounter 07/16/2017   Past Surgical History:  Procedure Laterality Date   ATRIAL FIBRILLATION ABLATION N/A 04/16/2022   Procedure: ATRIAL FIBRILLATION ABLATION;  Surgeon: Vickie Epley, MD;  Location: [facility];  Service: Cardiovascular;  Laterality: N/A;   BREAST BIOPSY  1972   Broken wrist  2010   CARDIOVERSION N/A 11/22/2021   Procedure: CARDIOVERSION;  Surgeon: Geralynn Rile, MD;  Location: [facility];  Service: Cardiovascular;  Laterality: N/A;   CATARACT EXTRACTION  03/2012   left   COLONOSCOPY     COSMETIC SURGERY     FRACTURE SURGERY     INNER EAR SURGERY     busted ear drum  MAXIMUM ACCESS (MAS)POSTERIOR LUMBAR INTERBODY FUSION (PLIF) 1 LEVEL N/A 10/11/2016   Procedure: Lumbar one-Sacral one Maximum access posterior lumbar interbody fusion;  Surgeon: Erline Levine, MD;  Location: [facility];  Service: Neurosurgery;  Laterality: N/A;   ORIF WRIST FRACTURE Right 07/18/2017   Procedure: OPEN REDUCTION INTERNAL FIXATION (ORIF) WRIST FRACTURE;  Surgeon: Renette Butters, MD;  Location: [facility];  Service: Orthopedics;  Laterality: Right;   PARATHYROIDECTOMY Left 04/12/2019   Procedure: LEFT SUPERIOR PARATHYROIDECTOMY;  Surgeon: [name], MD;  Location: [facility];  Service: General;  Laterality: Left;   pneumonia  2007   Social History   Social History   Marital status: Married    Spouse name: N/A   Number of children: 3   Years of education: N/A   Occupational History   Retired    Social History Main Topics   Smoking status: Former Smoker    Types: Cigarettes   Smokeless tobacco: Former Systems developer    Quit date: 08/19/1981   Alcohol use No   Drug use: No   Social History Network engineer -retired Building control surveyor   Married,  lives with spouse, [name], he is IT support for [employer]   3 sons   2 caffeinated beverages a day   No regular exercise, diet is ok   Current Outpatient Medications on File Prior to Visit  Medication Sig Dispense Refill   acetaminophen (TYLENOL) 325 MG tablet Take 650 mg by mouth daily. Arthritis     apixaban (ELIQUIS) 5 MG TABS tablet Take 1 tablet by mouth 2  times daily. 60 tablet 4   BIOTIN PO Take 1 tablet by mouth daily.     Cholecalciferol (VITAMIN D) 125 MCG (5000 UT) CAPS Take 5,000 Units by mouth daily.     diclofenac sodium (VOLTAREN) 1 % GEL Apply 2 g topically 4 (four) times daily as needed (pain).     fexofenadine (ALLEGRA) 180 MG tablet Take 180 mg by mouth daily as needed (allergies).     furosemide (LASIX) 20 MG tablet Take 2 - 3 tablets by mouth 2 times daily as needed for edema. (need appt for refills) 100 tablet 1   gabapentin (NEURONTIN) 100 MG capsule TAKE 3 CAPSULES IN THE MORNING AND 2 CAPSULES AT BEDTIME. OK TO TAKE EXTRA CAPSULE AT BEDTIME AS NEEDED 540 capsule 3   hydroxychloroquine (PLAQUENIL) 200 MG tablet Take 1 tablet by mouth 2 times a day with food or milk 180 tablet 1   hydroxypropyl methylcellulose / hypromellose (ISOPTO TEARS / GONIOVISC) 2.5 % ophthalmic solution Place 1-2 drops into both eyes daily.     losartan (COZAAR) 100 MG tablet TAKE 1 TABLET BY MOUTH ONCE A DAY 90 tablet 1   Menthol, Topical Analgesic, (BIOFREEZE) 4 % GEL Apply 1 application. topically daily as needed (pain).     metoprolol succinate (TOPROL-XL) 50 MG 24 hr tablet Take 1 tablet (50 mg total) by mouth 2 (two) times daily. 270 tablet 1   metoprolol tartrate (LOPRESSOR) 25 MG tablet Take 1 tablet  by mouth 3 times daily as needed (for heart rate greater than 110 bpm). 90 tablet 3   Multiple Vitamins-Minerals (MULTIVITAMIN WITH MINERALS) tablet Take 2 tablets by mouth daily.     pantoprazole (PROTONIX) 40 MG tablet Take 1 tablet (40 mg total) by mouth daily. 45 tablet 0    potassium chloride SA (KLOR-CON M) 20 MEQ tablet Take 1 tablet by mouth 2 times daily. 180 tablet 1  sulfaSALAzine (AZULFIDINE) 500 MG tablet Take 2 tablets by mouth twice a day. 360 tablet 1   vitamin C (ASCORBIC ACID) 500 MG tablet Take 500 mg by mouth daily.     No current facility-administered medications on file prior to visit.   Allergies  Allergen Reactions   Amlodipine Besylate Other (See Comments)    Tremors   Depakote [Divalproex Sodium] Swelling   Nortriptyline Nausea And Vomiting and Other (See Comments)    tremors   Crestor [Rosuvastatin]     Muscle aches, pains. Resolved with stopping medication.    Dilaudid [Hydromorphone Hcl]     Headache, muscle tightness   Tramadol     Felt "stoned"   Family History  Problem Relation Age of Onset   Stroke Mother    Hypertension Father    Heart attack Father    Hypertension Sister        x 3   Hypertension Brother    Hyperthyroidism Sister        x2, s/p RAI ablation   Hypothyroidism Brother    Colon cancer Neg Hx    Esophageal cancer Neg Hx    Rectal cancer Neg Hx    Stomach cancer Neg Hx    PE: There were no vitals taken for this visit. Wt Readings from Last 3 Encounters:  05/30/22 265 lb 12.8 oz (120.6 kg)  04/16/22 262 lb (118.8 kg)  04/01/22 262 lb 3.2 oz (118.9 kg)   Constitutional:  in NAD  The physical exam was not performed (virtual visit).  Assessment: 1. Hypercalcemia/hyperparathyroidism  2.  Osteopenia + fragility fractures = osteoporosis  3.  Vitamin D deficiency  4.  Multinodular goiter  Plan: Patient with a history of elevated calcium, and the highest calcium level being 10.9.  A parathyroid hormone was also elevated, at 69.  She also had a history of low vitamin D which normalized in 2019, after which she proceeded with parathyroid investigation.  Calcitriol, magnesium, phosphorus, and 24-hour urine calcium are all normal.  She did not have a history of nephrolithiasis but did have  osteoporosis (osteopenia on bone density scans and history of fragility fractures).  I referred her to surgery at that time.  A parathyroid scan showed a right superior parathyroid adenoma but this was not seen on subsequent ultrasound.  The ultrasound showed an inferior left possible parathyroid mass.  It also showed a right thyroid nodules and it was possible that this could have confounded the results of her parathyroid scan.  One of the thyroid nodules, was large, 3.7 cm (please see below).  She had a 4D CT afterwards, which showed a possible left interpolar 7 mm nodule consistent with a parathyroid adenoma.  She had biopsy.  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  Subsequent calcium levels were normal. -However,  PTH was elevated, at 122 on 05/29/2021 ([facility]), at which time, calcium was 9.2, normal.  GFR was slightly low, at 42.68.  Of note, she is taking biotin off-and-on - this can falsely elevate the PTH level by interference with the assay.  She is not sure whether she took biotin at the time of the last PTH test.  Latest PTH level obtained in 07/2021 was normal, though. -more recent calcium levels are low or normal, including at last check in 03/2022. -Plan to repeat her calcium level at next visit. -I will see her back in a year  2.  Osteoporosis -  No falls or fractures since last visit -Patient has a history of osteopenia on bone density scan, however, due to many fragility fractures, she has a clinical diagnosis of osteoporosis.  Most recent fracture was a right tibial one in 06/2017 -Her latest bone density scan was from early 2019: Worse, but scores were still in the osteopenic range -Suspect that her bone density may have improved after her parathyroid surgery -At last visit we discussed about repeating a bone density, and this was already ordered by PCP  3.  Vitamin D deficiency -She has a history of vitamin  D deficiency with a very low vitamin D level in 04/2018, after which we started 5000 units vitamin D daily -Last vitamin D level was normal: Lab Results  Component Value Date   VD25OH 40.47 05/29/2021  -We will repeat this at next visit  4.  Multinodular goiter -Initially seen on ultrasound from 09/2018: Largest nodule was 3.7 cm.  She had a biopsy of the nodule in 01/2019.  The biopsy result was inconclusive (AUS), while the Afirma molecular marker was benign  -We discussed that such a nodule is low risk -No neck compression symptoms -At last visit we discussed about repeating the ultrasound, but she did not have this done -We will recheck a thyroid ultrasound now -Most recent TSH was normal: Lab Results  Component Value Date   TSH 1.62 02/13/2022   Philemon Kingdom, MD PhD [facility]

## 2022-06-12 NOTE — Patient Instructions (Addendum)
Please schedule another thyroid ultrasound (you will be called by [facility] to schedule this) and also a new bone density scan (please call and schedule this at the [facility]: [phone]).  Please return to see me in 1 year.

## 2022-06-12 NOTE — Progress Notes (Addendum)
Patient ID: [name], female   DOB: 09-Feb-1951, 71 y.o.   MRN: [id number]   Patient location: Home My location: Office Persons participating in the virtual visit: patient, provider  Referring Provider: Farrel Conners, MD  I connected with the patient on 06/12/22 at  1:15 PM EDT by a video enabled telemedicine application and verified that I am speaking with the correct person.   I discussed the limitations of evaluation and management by telemedicine and the availability of in person appointments. The patient expressed understanding and agreed to proceed.   Details of the encounter are shown below.  HPI  [name] is a 71 y.o.-year-old female presenting for follow-up for primary hyperparathyroidism, osteoporosis, vitamin D deficiency and now also thyroid nodules. She is the wife of [name].    Interim history: No falls or fractures since last visit. She dropped a block of ice on her foot 2 days ago >> bleeding >> now healing.  Pt has had hypercalcemia at least since 2014.  She was then diagnosed with primary hyperparathyroidism.  Reviewed pertinent labs: Lab Results  Component Value Date   PTH 43 07/30/2021   PTH Comment 07/30/2021   PTH 122 (H) 05/29/2021   PTH 69 (H) 07/30/2018   PTH 66 (H) 04/03/2017   CALCIUM 10.0 03/28/2022   CALCIUM 9.4 11/19/2021   CALCIUM 9.3 11/05/2021   CALCIUM 8.9 10/15/2021   CALCIUM 9.1 10/14/2021   CALCIUM 8.6 (L) 10/13/2021   CALCIUM 8.5 (L) 10/12/2021   CALCIUM 7.8 (L) 10/11/2021   CALCIUM 8.6 (L) 10/10/2021   CALCIUM 9.9 08/29/2021  She takes biotin as a part of the B complex for peripheral neuropathy and erythromelalgia.  Calcitriol, magnesium, phosphorus were normal: Component     Latest Ref Rng & Units 07/30/2018  Vitamin D 1, 25 (OH) Total     18 - 72 pg/mL 69  Vitamin D3 1, 25 (OH)     pg/mL 69  Vitamin D2 1, 25 (OH)     pg/mL <8  Magnesium     1.5 - 2.5 mg/dL 1.9  Phosphorus     2.3 - 4.6 mg/dL 2.3    I referred her to see Dr. 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 Findings suggestive of multinodular goiter. 2. Nodule #1 meets imaging criteria to recommend percutaneous sampling as clinically indicated. 3. Nodule #5 meets imaging criteria to recommend a 1 year follow-up. 4. Punctate nodules adjacent to the inferior aspect of the left lobe of the thyroid (labeled 7 and 8), both appear external to the thyroid gland and thus may represent non pathologically enlarged cervical lymph nodes versus parathyroid adenomas. Further evaluation with contrast-enhanced neck CT and/or nuclear medicine parathyroid scintigraphy could be performed as indicated. FNA (02/04/2019): Atypia of unknown significance (Bethesda category 3.  Afirma: Benign. 4D CT (02/04/2019): 1. Best imaging candidate for hyperparathyroidism is a 7 mm nodule behind the interpolar left thyroid. 2. Enlarged and multinodular right thyroid lobe which may account for the scintigraphic appearance. Left superior parathyroidectomy (Dr. 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 - Right tibial plateau fracture 06/2017 (fell down the  stairs) - Right forearm 06/2017 (fell down the stairs) - Left maxillary fracture 06/2017 (fell down the stairs) - Right radial fracture 06/2017 (fell down the stairs)  No history of kidney stones.  No CKD. Last BUN/Cr: Lab Results  Component Value Date   BUN 20 03/28/2022   BUN 16 11/19/2021   CREATININE 1.13 (H) 03/28/2022   CREATININE 1.11 (H) 11/19/2021   She was on HCTZ in the past, but not recently.  Currently on Lasix.  Vitamin D deficiency:  Patient had a very low vitamin D level in 04/2017, but this normalized on 5000 units vitamin D daily: Lab Results  Component Value Date   VD25OH 40.47 05/29/2021   VD25OH 39 03/31/2020   VD25OH 36.01 02/10/2019   VD25OH 43.86 07/30/2018   VD25OH 42.24 03/04/2018   VD25OH 14.17 (L) 05/05/2017   VD25OH 51 05/14/2013   No family history of hypercalcemia, pituitary tumors, medullary thyroid cancer or osteoporosis. She has FH of thyroid ds in 2 sisters and 1 brother.  Her older son and younger sister had kidney stones.  Pt. also has a history of RA- on Plaquenil.  Previously on prednisone taper. She also has erythromelalgia, B12 deficiency - on B12 supplement, HL.  ROS: + see HPI  I reviewed pt's medications, allergies, PMH, social hx, family hx, and changes were documented in the history of present illness. Otherwise, unchanged from my initial visit note.  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   Osteopenia 10/17/2011   DEXA 09/2007:  -1.4 L fem; 10/2011: -1.2 L fem    Osteoporosis    PONV (postoperative nausea and vomiting)    Pseudogout of foot    Rheumatoid arthritis(714.0) dx 2010   Shingles    hx of    Tibial plateau fracture, right, closed, initial encounter 07/16/2017   Past Surgical History:  Procedure Laterality Date   ATRIAL FIBRILLATION ABLATION N/A 04/16/2022   Procedure: ATRIAL FIBRILLATION ABLATION;  Surgeon: Vickie Epley, MD;  Location: [facility];  Service: Cardiovascular;  Laterality: N/A;   BREAST BIOPSY  1972   Broken wrist  2010   CARDIOVERSION N/A 11/22/2021   Procedure: CARDIOVERSION;  Surgeon: Geralynn Rile, MD;  Location: [facility];  Service: Cardiovascular;  Laterality: N/A;   CATARACT EXTRACTION  03/2012   left   COLONOSCOPY     COSMETIC SURGERY     FRACTURE SURGERY     INNER EAR SURGERY     busted ear drum   MAXIMUM ACCESS (  MAS)POSTERIOR LUMBAR INTERBODY FUSION (PLIF) 1 LEVEL N/A 10/11/2016   Procedure: Lumbar one-Sacral one Maximum access posterior lumbar interbody fusion;  Surgeon: Erline Levine, MD;  Location: [facility];  Service: Neurosurgery;  Laterality: N/A;   ORIF WRIST FRACTURE Right 07/18/2017   Procedure: OPEN REDUCTION INTERNAL FIXATION (ORIF) WRIST FRACTURE;  Surgeon: Renette Butters, MD;  Location: [facility];  Service: Orthopedics;  Laterality: Right;   PARATHYROIDECTOMY Left 04/12/2019   Procedure: LEFT SUPERIOR PARATHYROIDECTOMY;  Surgeon: [name], MD;  Location: [facility];  Service: General;  Laterality: Left;   pneumonia  2007   Social History   Social History   Marital status: Married    Spouse name: N/A   Number of children: 3   Years of education: N/A   Occupational History   Retired    Social History Main Topics   Smoking status: Former Smoker    Types: Cigarettes   Smokeless tobacco: Former Systems developer    Quit date: 08/19/1981   Alcohol use No   Drug use: No   Social History Network engineer -retired Building control surveyor   Married, lives with  spouse, [name], he is IT support for [employer]   3 sons   2 caffeinated beverages a day   No regular exercise, diet is ok   Current Outpatient Medications on File Prior to Visit  Medication Sig Dispense Refill   acetaminophen (TYLENOL) 325 MG tablet Take 650 mg by mouth daily. Arthritis     apixaban (ELIQUIS) 5 MG TABS tablet Take 1 tablet by mouth 2  times daily. 60 tablet 4   BIOTIN PO Take 1 tablet by mouth daily.     Cholecalciferol (VITAMIN D) 125 MCG (5000 UT) CAPS Take 5,000 Units by mouth daily.     diclofenac sodium (VOLTAREN) 1 % GEL Apply 2 g topically 4 (four) times daily as needed (pain).     fexofenadine (ALLEGRA) 180 MG tablet Take 180 mg by mouth daily as needed (allergies).     furosemide (LASIX) 20 MG tablet Take 2 - 3 tablets by mouth 2 times daily as needed for edema. (need appt for refills) 100 tablet 1   gabapentin (NEURONTIN) 100 MG capsule TAKE 3 CAPSULES IN THE MORNING AND 2 CAPSULES AT BEDTIME. OK TO TAKE EXTRA CAPSULE AT BEDTIME AS NEEDED 540 capsule 3   hydroxychloroquine (PLAQUENIL) 200 MG tablet Take 1 tablet by mouth 2 times a day with food or milk 180 tablet 1   hydroxypropyl methylcellulose / hypromellose (ISOPTO TEARS / GONIOVISC) 2.5 % ophthalmic solution Place 1-2 drops into both eyes daily.     losartan (COZAAR) 100 MG tablet TAKE 1 TABLET BY MOUTH ONCE A DAY 90 tablet 1   Menthol, Topical Analgesic, (BIOFREEZE) 4 % GEL Apply 1 application. topically daily as needed (pain).     metoprolol succinate (TOPROL-XL) 50 MG 24 hr tablet Take 1 tablet (50 mg total) by mouth 2 (two) times daily. 270 tablet 1   metoprolol tartrate (LOPRESSOR) 25 MG tablet Take 1 tablet  by mouth 3 times daily as needed (for heart rate greater than 110 bpm). 90 tablet 3   Multiple Vitamins-Minerals (MULTIVITAMIN WITH MINERALS) tablet Take 2 tablets by mouth daily.     pantoprazole (PROTONIX) 40 MG tablet Take 1 tablet (40 mg total) by mouth daily. 45 tablet 0   potassium  chloride SA (KLOR-CON M) 20 MEQ tablet Take 1 tablet by mouth 2 times daily. 180 tablet 1   sulfaSALAzine (  AZULFIDINE) 500 MG tablet Take 2 tablets by mouth twice a day. 360 tablet 1   vitamin C (ASCORBIC ACID) 500 MG tablet Take 500 mg by mouth daily.     No current facility-administered medications on file prior to visit.   Allergies  Allergen Reactions   Amlodipine Besylate Other (See Comments)    Tremors   Depakote [Divalproex Sodium] Swelling   Nortriptyline Nausea And Vomiting and Other (See Comments)    tremors   Crestor [Rosuvastatin]     Muscle aches, pains. Resolved with stopping medication.    Dilaudid [Hydromorphone Hcl]     Headache, muscle tightness   Tramadol     Felt "stoned"   Family History  Problem Relation Age of Onset   Stroke Mother    Hypertension Father    Heart attack Father    Hypertension Sister        x 3   Hypertension Brother    Hyperthyroidism Sister        x2, s/p RAI ablation   Hypothyroidism Brother    Colon cancer Neg Hx    Esophageal cancer Neg Hx    Rectal cancer Neg Hx    Stomach cancer Neg Hx    PE: There were no vitals taken for this visit. Wt Readings from Last 3 Encounters:  05/30/22 265 lb 12.8 oz (120.6 kg)  04/16/22 262 lb (118.8 kg)  04/01/22 262 lb 3.2 oz (118.9 kg)   Constitutional: overweight, in NAD Eyes: PERRLA, EOMI, no exophthalmos ENT: moist mucous membranes, no thyromegaly, no cervical lymphadenopathy Cardiovascular: RRR, No MRG Respiratory: CTA B Musculoskeletal: no deformities, strength intact in all 4 Skin: moist, warm, no rashes Neurological: no tremor with outstretched hands, DTR normal in all 4  Assessment: 1. Hypercalcemia/hyperparathyroidism  2.  Osteopenia + fragility fractures = osteoporosis  3.  Vitamin D deficiency  4.  Multinodular goiter  Plan: Patient with a history of elevated calcium, and the highest calcium level being 10.9.  A parathyroid hormone was also elevated, at 69.  She also  had a history of low vitamin D which normalized in 2019, after which she proceeded with parathyroid investigation.  Calcitriol, magnesium, phosphorus, and 24-hour urine calcium are all normal.  She did not have a history of nephrolithiasis but did have osteoporosis (osteopenia on bone density scans and history of fragility fractures).  I referred her to surgery at that time.  A parathyroid scan showed a right superior parathyroid adenoma but this was not seen on subsequent ultrasound.  The ultrasound showed an inferior left possible parathyroid mass.  It also showed a right thyroid nodules and it was possible that this could have confounded the results of her parathyroid scan.  One of the thyroid nodules, was large, 3.7 cm (please see below).  She had a 4D CT afterwards, which showed a possible left interpolar 7 mm nodule consistent with a parathyroid adenoma.  She had biopsy.  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  Subsequent calcium levels were normal. -PTH was elevated, at 122 on 05/29/2021 ([facility]), at which time, calcium was 9.2, normal.  GFR was slightly low, at 42.68.  Of note, she is taking biotin off-and-on - this can falsely elevate the PTH level by interference with the assay.  She is not sure whether she took biotin at the time of the last PTH test. -At last visit, calcium and PTH were normal -We will repeat a  calcium level at next visit -I will see the patient back in 1 year  2.  Osteoporosis -No falls or fractures since last visit -Patient has a history of osteopenia on bone density scan, however, due to many fragility fractures, she has a clinical diagnosis of osteoporosis.  Most recent fracture was a right tibial one in 06/2017 -Her latest bone density scan was from early 2019: Worse, but scores were still in the osteopenic range -Her bone density scores were most likely improved after hyperparathyroidism  surgery -At last visit we discussed about repeating a bone density scan which was already ordered by PCP.  I did not get the results. -will order new bone density today.   3.  Vitamin D deficiency -She has a history of vitamin D deficiency with a low vitamin D level in 04/2018, after which we started 5000 units vitamin D daily -Before last visit, she was off her supplement but we restarted this.  At last visit, vitamin D level was normal: Lab Results  Component Value Date   VD25OH 40.47 05/29/2021  -We will repeat a level at next visit  4.  Multinodular goiter -No neck compression symptoms -Initially seen on ultrasound from 09/2019 largest nodule: 3.7 cm -A biopsy of this nodule was inconclusive (atypia of unknown significance), but the Afirma molecular marker was benign, which gives her a low risk for cancer. -Plan to repeat another ultrasound now -Reviewed her latest TFTs: TSH was normal  Orders Placed This Encounter  Procedures   DG Bone Density   US THYROID   Addendum: DXA (06/24/2022):  06/24/2022 ([place]) Lumbar spine L1-L3 (L4) Femoral neck (FN) FRAX  T-score -0.8 RFN: -1.0 LFN: -2.0 MOF: 22% Hip fracture: 5.1%  Change in BMD from previous DXA test (%) +11.1%* +1.8%   (*) statistically significant  Her bone density scores have improved especially at the lumbar spine.  However, the 10-year risk of fracture is still above target.  Also, she has a history of several fractures.  At this point, I would suggest to add Fosamax 70 mg weekly.   Philemon Kingdom, MD PhD [facility]

## 2022-06-13 ENCOUNTER — Telehealth: Payer: Medicare Other | Admitting: Internal Medicine

## 2022-06-14 DIAGNOSIS — J9601 Acute respiratory failure with hypoxia: Secondary | ICD-10-CM | POA: Diagnosis not present

## 2022-06-24 ENCOUNTER — Ambulatory Visit (INDEPENDENT_AMBULATORY_CARE_PROVIDER_SITE_OTHER)
Admission: RE | Admit: 2022-06-24 | Discharge: 2022-06-24 | Disposition: A | Discharge status: Home / Self Care | Payer: Medicare Other | Source: Ambulatory Visit | Attending: Internal Medicine | Admitting: Internal Medicine

## 2022-06-24 DIAGNOSIS — M810 Age-related osteoporosis without current pathological fracture: Secondary | ICD-10-CM

## 2022-06-25 ENCOUNTER — Telehealth: Payer: Self-pay | Admitting: Family Medicine

## 2022-06-25 NOTE — Telephone Encounter (Signed)
Left message for patient to call back and schedule Medicare Annual Wellness Visit (AWV) either virtually or in office. Left  my [name] number [phone]   *due 06/2022  awvi per [facility] 06/19/22  please schedule with Nurse Health Adviser   45 min for awv-i and in office appointments 30 min for awv-s  phone/virtual appointments

## 2022-06-28 ENCOUNTER — Other Ambulatory Visit: Payer: Self-pay | Admitting: Family Medicine

## 2022-06-28 ENCOUNTER — Other Ambulatory Visit: Payer: Self-pay | Admitting: Physician Assistant

## 2022-06-28 ENCOUNTER — Other Ambulatory Visit (HOSPITAL_COMMUNITY): Payer: Self-pay

## 2022-06-28 DIAGNOSIS — R600 Localized edema: Secondary | ICD-10-CM

## 2022-06-28 MED ORDER — FUROSEMIDE 20 MG PO TABS
40.0000 mg | ORAL_TABLET | Freq: Two times a day (BID) | ORAL | 1 refills | Status: DC | PRN
  Filled 2022-06-28: qty 100, 17d supply, fill #0
  Filled 2022-07-22: qty 100, 17d supply, fill #1

## 2022-07-01 ENCOUNTER — Other Ambulatory Visit (HOSPITAL_COMMUNITY): Payer: Self-pay

## 2022-07-01 ENCOUNTER — Other Ambulatory Visit: Payer: Medicare Other

## 2022-07-02 DIAGNOSIS — M2550 Pain in unspecified joint: Secondary | ICD-10-CM | POA: Diagnosis not present

## 2022-07-02 DIAGNOSIS — M5136 Other intervertebral disc degeneration, lumbar region: Secondary | ICD-10-CM | POA: Diagnosis not present

## 2022-07-02 DIAGNOSIS — M0609 Rheumatoid arthritis without rheumatoid factor, multiple sites: Secondary | ICD-10-CM | POA: Diagnosis not present

## 2022-07-02 DIAGNOSIS — I7381 Erythromelalgia: Secondary | ICD-10-CM | POA: Diagnosis not present

## 2022-07-03 ENCOUNTER — Other Ambulatory Visit: Payer: Self-pay | Admitting: *Deleted

## 2022-07-03 ENCOUNTER — Other Ambulatory Visit (HOSPITAL_COMMUNITY): Payer: Self-pay

## 2022-07-03 ENCOUNTER — Other Ambulatory Visit: Payer: Self-pay | Admitting: Physician Assistant

## 2022-07-03 LAB — LAB REPORT - SCANNED: EGFR: 46

## 2022-07-03 NOTE — Telephone Encounter (Signed)
Pt's pharmacy is requesting a refill on pantoprazole. Would Dr. Quentin Ore like to refill this medication? Please address

## 2022-07-08 ENCOUNTER — Ambulatory Visit
Admission: RE | Admit: 2022-07-08 | Discharge: 2022-07-08 | Disposition: A | Discharge status: Home / Self Care | Payer: Medicare Other | Source: Ambulatory Visit | Attending: Internal Medicine | Admitting: Internal Medicine

## 2022-07-08 DIAGNOSIS — E041 Nontoxic single thyroid nodule: Secondary | ICD-10-CM | POA: Diagnosis not present

## 2022-07-08 DIAGNOSIS — E042 Nontoxic multinodular goiter: Secondary | ICD-10-CM

## 2022-07-15 ENCOUNTER — Encounter: Payer: Self-pay | Admitting: Cardiology

## 2022-07-15 ENCOUNTER — Ambulatory Visit: Payer: Medicare Other | Admitting: Cardiology

## 2022-07-15 ENCOUNTER — Other Ambulatory Visit (HOSPITAL_COMMUNITY): Payer: Self-pay

## 2022-07-15 ENCOUNTER — Ambulatory Visit: Payer: Medicare Other | Attending: Cardiology | Admitting: Cardiology

## 2022-07-15 VITALS — BP 160/74 | HR 76 | Ht 66.0 in | Wt 265.0 lb

## 2022-07-15 DIAGNOSIS — I4819 Other persistent atrial fibrillation: Secondary | ICD-10-CM

## 2022-07-15 DIAGNOSIS — I5032 Chronic diastolic (congestive) heart failure: Secondary | ICD-10-CM

## 2022-07-15 DIAGNOSIS — J9601 Acute respiratory failure with hypoxia: Secondary | ICD-10-CM | POA: Diagnosis not present

## 2022-07-15 MED ORDER — SULFASALAZINE 500 MG PO TABS
1000.0000 mg | ORAL_TABLET | Freq: Two times a day (BID) | ORAL | 1 refills | Status: DC
  Filled 2022-07-15: qty 360, 90d supply, fill #0
  Filled 2022-10-09: qty 360, 90d supply, fill #1

## 2022-07-15 NOTE — Patient Instructions (Signed)
Medication Instructions:  Your physician recommends that you continue on your current medications as directed. Please refer to the Current Medication list given to you today.  *If you need a refill on your cardiac medications before your next appointment, please call your pharmacy*   Lab Work: None ordered.  If you have labs (blood work) drawn today and your tests are completely normal, you will receive your results only by: [place] (if you have MyChart) OR A paper copy in the mail If you have any lab test that is abnormal or we need to change your treatment, we will call you to review the results.   Testing/Procedures: Dr Quentin Ore is planning a Watchman Implant   Follow-Up: At [facility], you and your health needs are our priority.  As part of our continuing mission to provide you with exceptional heart care, we have created designated Provider Care Teams.  These Care Teams include your primary Cardiologist (physician) and Advanced Practice Providers (APPs -  Physician Assistants and Nurse Practitioners) who all work together to provide you with the care you need, when you need it.  We recommend signing up for the patient portal called "MyChart".  Sign up information is provided on this After Visit Summary.  MyChart is used to connect with patients for Virtual Visits (Telemedicine).  Patients are able to view lab/test results, encounter notes, upcoming appointments, etc.  Non-urgent messages can be sent to your provider as well.   To learn more about what you can do with MyChart, go to [email or web address].    Your next appointment:   To be scheduled  Important Information About Sugar

## 2022-07-15 NOTE — Progress Notes (Signed)
Electrophysiology Office Follow up Visit Note:    Date:  07/15/2022   ID:  Christine, [name] 1951-08-10, MRN [id number]  PCP:  Farrel Conners, MD  [facility] Cardiologist:  None  [facility] Electrophysiologist:  Vickie Epley, MD    Interval History:    [name] is a 71 y.o. female who presents for a follow up visit.  She underwent a successful A-fib ablation on April 16, 2022.  She saw Butch Penny in [facility] on May 30, 2022.  At that appointment she was doing well without symptomatic recurrence.  Since the appointment with Butch Penny she tells me she has done well without recurrence of atrial fibrillation.  She continues to take Eliquis for stroke prophylaxis but desires a long-term stroke risk mitigation strategy avoiding long-term exposure to anticoagulation.  She asks about watchman implant today.  She has chronic arthritis and would like to take NSAIDs without the increased risk of bleeding associated with concomitant anticoagulant use.       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   Osteopenia 10/17/2011   DEXA 09/2007: -1.4 L fem; 10/2011: -1.2 L fem    Osteoporosis    PONV (postoperative nausea and vomiting)    Pseudogout of foot    Rheumatoid arthritis(714.0) dx 2010   Shingles    hx of    Tibial plateau fracture, right, closed, initial encounter 07/16/2017    Past Surgical History:  Procedure Laterality Date   ATRIAL FIBRILLATION ABLATION N/A  04/16/2022   Procedure: ATRIAL FIBRILLATION ABLATION;  Surgeon: Vickie Epley, MD;  Location: [place] [facility];  Service: Cardiovascular;  Laterality: N/A;   BREAST BIOPSY  1972   Broken wrist  2010   CARDIOVERSION N/A 11/22/2021   Procedure: CARDIOVERSION;  Surgeon: Geralynn Rile, MD;  Location: [place];  Service: Cardiovascular;  Laterality: N/A;   CATARACT EXTRACTION  03/2012   left   COLONOSCOPY     COSMETIC SURGERY     FRACTURE SURGERY     INNER EAR SURGERY     busted ear drum   MAXIMUM ACCESS (MAS)POSTERIOR LUMBAR INTERBODY FUSION (PLIF) 1 LEVEL N/A 10/11/2016   Procedure: Lumbar one-Sacral one Maximum access posterior lumbar interbody fusion;  Surgeon: Erline Levine, MD;  Location: [place];  Service: Neurosurgery;  Laterality: N/A;   ORIF WRIST FRACTURE Right 07/18/2017   Procedure: OPEN REDUCTION INTERNAL FIXATION (ORIF) WRIST FRACTURE;  Surgeon: Renette Butters, MD;  Location: [place];  Service: Orthopedics;  Laterality: Right;   PARATHYROIDECTOMY Left 04/12/2019   Procedure: LEFT SUPERIOR PARATHYROIDECTOMY;  Surgeon: [name], MD;  Location: [facility];  Service: General;  Laterality: Left;   pneumonia  2007    Current Medications: Current Meds  Medication Sig   acetaminophen (TYLENOL) 325 MG tablet Take 650 mg by mouth daily. Arthritis   apixaban (ELIQUIS) 5 MG TABS tablet Take 1  tablet by mouth 2  times daily.   BIOTIN PO Take 1 tablet by mouth daily.   Cholecalciferol (VITAMIN D) 125 MCG (5000 UT) CAPS Take 5,000 Units by mouth daily.   diclofenac sodium (VOLTAREN) 1 % GEL Apply 2 g topically 4 (four) times daily as needed (pain).   fexofenadine (ALLEGRA) 180 MG tablet Take 180 mg by mouth daily as needed (allergies).   furosemide (LASIX) 20 MG tablet Take 2 - 3 tablets by mouth 2 times daily as needed for edema. (need appt for refills)   gabapentin (NEURONTIN) 100 MG capsule TAKE 3 CAPSULES IN THE MORNING AND 2 CAPSULES AT BEDTIME. OK TO TAKE EXTRA CAPSULE AT  BEDTIME AS NEEDED   hydroxychloroquine (PLAQUENIL) 200 MG tablet Take 1 tablet by mouth 2 times a day with food or milk   hydroxypropyl methylcellulose / hypromellose (ISOPTO TEARS / GONIOVISC) 2.5 % ophthalmic solution Place 1-2 drops into both eyes daily.   losartan (COZAAR) 100 MG tablet TAKE 1 TABLET BY MOUTH ONCE A DAY   Menthol, Topical Analgesic, (BIOFREEZE) 4 % GEL Apply 1 application. topically daily as needed (pain).   metoprolol succinate (TOPROL-XL) 50 MG 24 hr tablet Take 1 tablet (50 mg total) by mouth 2 (two) times daily.   metoprolol tartrate (LOPRESSOR) 25 MG tablet Take 1 tablet  by mouth 3 times daily as needed (for heart rate greater than 110 bpm).   Multiple Vitamins-Minerals (MULTIVITAMIN WITH MINERALS) tablet Take 2 tablets by mouth daily.   potassium chloride SA (KLOR-CON M) 20 MEQ tablet Take 1 tablet by mouth 2 times daily.   sulfaSALAzine (AZULFIDINE) 500 MG tablet Take 2 tablets by mouth twice a day.   vitamin C (ASCORBIC ACID) 500 MG tablet Take 500 mg by mouth daily.     Allergies:   Amlodipine besylate, Depakote [divalproex sodium], Nortriptyline, Crestor [rosuvastatin], Dilaudid [hydromorphone hcl], and Tramadol   Social History   Socioeconomic History   Marital status: Married    Spouse name: Not on file   Number of children: 3   Years of education: Not on file   Highest education level: 11th grade  Occupational History   Occupation: Retired  Tobacco Use   Smoking status: Former    Packs/day: 4.00    Years: 4.00    Total pack years: 16.00    Types: Cigarettes    Start date: 2    Quit date: 1985    Years since quitting: 38.9   Smokeless tobacco: Never  Vaping Use   Vaping Use: Never used  Substance and Sexual Activity   Alcohol use: No    Alcohol/week: 0.0 standard drinks of alcohol   Drug use: No   Sexual activity: Not on file  Other Topics Concern   Not on file  Social History Narrative   Artist -retired Building control surveyor   Married,  lives with spouse, [name], he is IT support for [employer]   3 sons   2 caffeinated beverages a day   No regular exercise, diet is ok   Right handed   Social Determinants of Health   Financial Resource Strain: Low Risk  (02/12/2022)   Overall Financial Resource Strain (CARDIA)    Difficulty of Paying Living Expenses: Not hard at all  Food Insecurity: No Food Insecurity (02/12/2022)   Hunger Vital Sign    Worried About Running Out of Food in the Last Year: Never true    [place] in the Last Year: Never true  Transportation Needs: No Transportation Needs (02/12/2022)   PRAPARE - Hydrologist (Medical): No    Lack of Transportation (Non-Medical): No  Physical Activity: Unknown (02/12/2022)   Exercise Vital Sign    Days of Exercise per Week: 0 days    Minutes of Exercise per Session: Not on file  Stress: No Stress Concern Present (02/12/2022)   [place]    Feeling of Stress : Not at all  Social Connections: Unknown (02/12/2022)   Social Connection and Isolation Panel [NHANES]    Frequency of Communication with Friends and Family: More than three times a week    Frequency of Social Gatherings with Friends and Family: More than three times a week    Attends Religious Services: Patient refused    Marine scientist or Organizations: No    Attends Music therapist: Not on file    Marital Status: Married     Family History: The patient's family history includes Heart attack in her father; Hypertension in her brother, father, and sister; Hyperthyroidism in her sister; Hypothyroidism in her brother; Stroke in her mother. There is no history of Colon cancer, Esophageal cancer, Rectal cancer, or Stomach cancer.  ROS:   Please see the history of present illness.    All other systems reviewed and are negative.  EKGs/Labs/Other Studies Reviewed:    The following  studies were reviewed today:   EKG:  The ekg ordered today demonstrates sinus rhythm, PVCs  Recent Labs: 10/10/2021: B Natriuretic Peptide 428.4 10/13/2021: Magnesium 2.1 11/05/2021: ALT 13 02/13/2022: TSH 1.62 03/28/2022: BUN 20; Creatinine, Ser 1.13; Hemoglobin 13.8; Platelets 214; Potassium 3.9; Sodium 141  Recent Lipid Panel    Component Value Date/Time   CHOL 160 08/29/2021 1352   CHOL 225 (H) 06/21/2014 1146   TRIG 149.0 08/29/2021 1352   TRIG 169 (H) 06/21/2014 1146   HDL 42.30 08/29/2021 1352   HDL 48 06/21/2014 1146   CHOLHDL 4 08/29/2021 1352   VLDL 29.8 08/29/2021 1352   LDLCALC 88 08/29/2021 1352   LDLCALC 158 (H) 03/31/2020 0845   LDLCALC 143 (H) 06/21/2014 1146   LDLDIRECT 153.0 05/29/2021 1109    Physical Exam:    VS:  BP (!) 160/74   Pulse 76   Ht '5\' 6"'$  (1.676 m)   Wt 265 lb (120.2 kg)   SpO2 93%   BMI 42.77 kg/m     Wt Readings from Last 3 Encounters:  07/15/22 265 lb (120.2 kg)  05/30/22 265 lb 12.8 oz (120.6 kg)  04/16/22 262 lb (118.8 kg)     GEN:  Well nourished, well developed in no acute distress.  Obese HEENT: Normal NECK: No JVD; No carotid bruits LYMPHATICS: No lymphadenopathy CARDIAC: RRR, no murmurs, rubs, gallops RESPIRATORY:  Clear to auscultation without rales, wheezing or rhonchi  ABDOMEN: Soft, non-tender, non-distended MUSCULOSKELETAL:  No edema; No deformity  SKIN: Warm and dry NEUROLOGIC:  Alert and oriented x 3 PSYCHIATRIC:  Normal affect        ASSESSMENT:    1. Persistent atrial fibrillation ([place])   2. Chronic diastolic heart failure (H[facility])    PLAN:    In order of problems listed above:  #Persistent atrial fibrillation Doing well after her April 16, 2022 ablation without recurrence On Eliquis for stroke prophylaxis.  She would like to be on the use of NSAIDs more freely given her chronic arthritic pain and inquires about watchman implant during  today's visit.  I do think she would be a reasonable candidate for  this procedure.  We discussed the procedural details including the risks and likelihood of success and she wishes to proceed.  --------------------  I have seen [name] in the office today who is being considered for a Watchman left atrial appendage closure device. I believe they will benefit from this procedure given their history of atrial fibrillation, CHA2DS2-VASc score of 4 and unadjusted ischemic stroke rate of 4.4% per year. Unfortunately, the patient is not felt to be a long term anticoagulation candidate secondary to chronic NSAID use due to arthritic pain. The patient's chart has been reviewed and I feel that they would be a candidate for short term oral anticoagulation after Watchman implant.   It is my belief that after undergoing a LAA closure procedure, [name] will not need long term anticoagulation which eliminates anticoagulation side effects and major bleeding risk.   Procedural risks for the Watchman implant have been reviewed with the patient including a 0.5% risk of stroke, <1% risk of perforation and <1% risk of device embolization. Other risks include bleeding, vascular damage, tamponade, worsening renal function, and death. The patient understands these risk and wishes to proceed.     The published clinical data on the safety and effectiveness of WATCHMAN include but are not limited to the following: - Holmes DR, Mechele Claude, Sick P et al. for the PROTECT AF Investigators. Percutaneous closure of the left atrial appendage versus warfarin therapy for prevention of stroke in patients with atrial fibrillation: a randomised non-inferiority trial. Lancet 2009; 374: 534-42. Mechele Claude, Doshi SK, Abelardo Diesel D et al. on behalf of the PROTECT AF Investigators. Percutaneous Left Atrial Appendage Closure for Stroke Prophylaxis in Patients With Atrial Fibrillation 2.3-Year Follow-up of the PROTECT AF (Watchman Left Atrial Appendage System for Embolic Protection in  Patients With Atrial Fibrillation) Trial. Circulation 2013; 127:720-729. - Alli O, Doshi S,  Kar S, Reddy VY, Sievert H et al. Quality of Life Assessment in the Randomized PROTECT AF (Percutaneous Closure of the Left Atrial Appendage Versus Warfarin Therapy for Prevention of Stroke in Patients With Atrial Fibrillation) Trial of Patients at Risk for Stroke With Nonvalvular Atrial Fibrillation. J Am Coll Cardiol 2013; 61:6073-7. Vertell Limber DR, Tarri Abernethy, Price M, Martinez, Sievert H, Doshi S, Huber K, Reddy V. Prospective randomized evaluation of the Watchman left atrial appendage Device in patients with atrial fibrillation versus long-term warfarin therapy; the PREVAIL trial. Journal of the [employer] of Cardiology, Vol. 4, No. 1, 2014, 1-11. - Kar S, Doshi SK, Sadhu A, Horton R, Osorio J et al. Primary outcome evaluation of a next-generation left atrial appendage closure device: results from the PINNACLE FLX trial. Circulation 2021;143(18)1754-1762.    After today's visit with the patient which was dedicated solely for shared decision making visit regarding LAA closure device, the patient decided to proceed with the LAA appendage closure procedure scheduled to [facility] in the [facility].   HAS-BLED score 3 Hypertension Yes  Abnormal renal and liver function (Dialysis, transplant, Cr >2.26 mg/dL /Cirrhosis or Bilirubin >2x Normal or AST/ALT/AP >3x Normal) No  Stroke No  Bleeding No  Labile INR (Unstable/high INR) No  Elderly (>65) Yes  Drugs or alcohol (? 8 drinks/week, anti-plt or NSAID) Yes   CHA2DS2-VASc Score = 4  The patient's score is based upon: CHF History: 1 HTN History: 1 Diabetes History: 0 Stroke History: 0  Vascular Disease History: 0 Age Score: 1 Gender Score: 1         Medication Adjustments/Labs and Tests Ordered: Current medicines are reviewed at length with the patient today.  Concerns regarding medicines are outlined above.  Orders  Placed This Encounter  Procedures   EKG 12-Lead   No orders of the defined types were placed in this encounter.    Signed, Lars Mage, MD, [facility], [facility] 07/15/2022 9:22 PM    Electrophysiology [facility]

## 2022-07-16 ENCOUNTER — Other Ambulatory Visit (HOSPITAL_COMMUNITY): Payer: Self-pay

## 2022-07-16 ENCOUNTER — Telehealth: Payer: Self-pay

## 2022-07-16 NOTE — Telephone Encounter (Signed)
-----   Message from Vickie Epley, MD sent at 07/15/2022  9:26 PM EST ----- Planning for watchman implant.  No need for repeat CT given preablation CT scan.  Can you submit this to our [place] for measurement?  Thanks, Lysbeth Galas

## 2022-07-18 ENCOUNTER — Ambulatory Visit (INDEPENDENT_AMBULATORY_CARE_PROVIDER_SITE_OTHER): Payer: Medicare Other

## 2022-07-18 VITALS — Ht 66.0 in | Wt 262.0 lb

## 2022-07-18 DIAGNOSIS — Z0000 Encounter for general adult medical examination without abnormal findings: Secondary | ICD-10-CM

## 2022-07-18 NOTE — Patient Instructions (Addendum)
[name] , Thank you for taking time to come for your Medicare Wellness Visit. I appreciate your ongoing commitment to your health goals. Please review the following plan we discussed and let me know if I can assist you in the future.   These are the goals we discussed:  Goals       Patient stated (pt-stated)      Get out in my yard and work        This is a list of the screening recommended for you and due dates:  Health Maintenance  Topic Date Due   COVID-19 Vaccine (1) 08/03/2022*   Zoster (Shingles) Vaccine (1 of 2) 10/17/2022*   Flu Shot  11/17/2022*   Mammogram  07/19/2023*   Medicare Annual Wellness Visit  07/19/2023   Colon Cancer Screening  10/01/2023   DTaP/Tdap/Td vaccine (2 - Td or Tdap) 05/31/2026   Pneumonia Vaccine  Completed   DEXA scan (bone density measurement)  Completed   Hepatitis C Screening: USPSTF Recommendation to screen - Ages 39-79 yo.  Completed   HPV Vaccine  Aged Out  *Topic was postponed. The date shown is not the original due date.    Advanced directives: Advance directive discussed with you today. Even though you declined this today, please call our office should you change your mind, and we can give you the proper paperwork for you to fill out.   Conditions/risks identified: None  Next appointment: Follow up in one year for your annual wellness visit     Preventive Care 65 Years and Older, Female Preventive care refers to lifestyle choices and visits with your health care provider that can promote health and wellness. What does preventive care include? A yearly physical exam. This is also called an annual well check. Dental exams once or twice a year. Routine eye exams. Ask your health care provider how often you should have your eyes checked. Personal lifestyle choices, including: Daily care of your teeth and gums. Regular physical activity. Eating a healthy diet. Avoiding tobacco and drug use. Limiting alcohol use. Practicing safe  sex. Taking low-dose aspirin every day. Taking vitamin and mineral supplements as recommended by your health care provider. What happens during an annual well check? The services and screenings done by your health care provider during your annual well check will depend on your age, overall health, lifestyle risk factors, and family history of disease. Counseling  Your health care provider may ask you questions about your: Alcohol use. Tobacco use. Drug use. Emotional well-being. Home and relationship well-being. Sexual activity. Eating habits. History of falls. Memory and ability to understand (cognition). Work and work Statistician. Reproductive health. Screening  You may have the following tests or measurements: Height, weight, and BMI. Blood pressure. Lipid and cholesterol levels. These may be checked every 5 years, or more frequently if you are over 25 years old. Skin check. Lung cancer screening. You may have this screening every year starting at age 57 if you have a 30-pack-year history of smoking and currently smoke or have quit within the past 15 years. Fecal occult blood test (FOBT) of the stool. You may have this test every year starting at age 64. Flexible sigmoidoscopy or colonoscopy. You may have a sigmoidoscopy every 5 years or a colonoscopy every 10 years starting at age 66. Hepatitis C blood test. Hepatitis B blood test. Sexually transmitted disease (STD) testing. Diabetes screening. This is done by checking your blood sugar (glucose) after you have not eaten for a  while (fasting). You may have this done every 1-3 years. Bone density scan. This is done to screen for osteoporosis. You may have this done starting at age 52. Mammogram. This may be done every 1-2 years. Talk to your health care provider about how often you should have regular mammograms. Talk with your health care provider about your test results, treatment options, and if necessary, the need for more  tests. Vaccines  Your health care provider may recommend certain vaccines, such as: Influenza vaccine. This is recommended every year. Tetanus, diphtheria, and acellular pertussis (Tdap, Td) vaccine. You may need a Td booster every 10 years. Zoster vaccine. You may need this after age 72. Pneumococcal 13-valent conjugate (PCV13) vaccine. One dose is recommended after age 34. Pneumococcal polysaccharide (PPSV23) vaccine. One dose is recommended after age 12. Talk to your health care provider about which screenings and vaccines you need and how often you need them. This information is not intended to replace advice given to you by your health care provider. Make sure you discuss any questions you have with your health care provider. Document Released: 09/01/2015 Document Revised: 04/24/2016 Document Reviewed: 06/06/2015 Elsevier Interactive Patient Education  2017 [employer] Prevention in the Home Falls can cause injuries. They can happen to people of all ages. There are many things you can do to make your home safe and to help prevent falls. What can I do on the outside of my home? Regularly fix the edges of walkways and driveways and fix any cracks. Remove anything that might make you trip as you walk through a door, such as a raised step or threshold. Trim any bushes or trees on the path to your home. Use bright outdoor lighting. Clear any walking paths of anything that might make someone trip, such as rocks or tools. Regularly check to see if handrails are loose or broken. Make sure that both sides of any steps have handrails. Any raised decks and porches should have guardrails on the edges. Have any leaves, snow, or ice cleared regularly. Use sand or salt on walking paths during winter. Clean up any spills in your garage right away. This includes oil or grease spills. What can I do in the bathroom? Use night lights. Install grab bars by the toilet and in the tub and shower.  Do not use towel bars as grab bars. Use non-skid mats or decals in the tub or shower. If you need to sit down in the shower, use a plastic, non-slip stool. Keep the floor dry. Clean up any water that spills on the floor as soon as it happens. Remove soap buildup in the tub or shower regularly. Attach bath mats securely with double-sided non-slip rug tape. Do not have throw rugs and other things on the floor that can make you trip. What can I do in the bedroom? Use night lights. Make sure that you have a light by your bed that is easy to reach. Do not use any sheets or blankets that are too big for your bed. They should not hang down onto the floor. Have a firm chair that has side arms. You can use this for support while you get dressed. Do not have throw rugs and other things on the floor that can make you trip. What can I do in the kitchen? Clean up any spills right away. Avoid walking on wet floors. Keep items that you use a lot in easy-to-reach places. If you need to reach something above you,  use a strong step stool that has a grab bar. Keep electrical cords out of the way. Do not use floor polish or wax that makes floors slippery. If you must use wax, use non-skid floor wax. Do not have throw rugs and other things on the floor that can make you trip. What can I do with my stairs? Do not leave any items on the stairs. Make sure that there are handrails on both sides of the stairs and use them. Fix handrails that are broken or loose. Make sure that handrails are as long as the stairways. Check any carpeting to make sure that it is firmly attached to the stairs. Fix any carpet that is loose or worn. Avoid having throw rugs at the top or bottom of the stairs. If you do have throw rugs, attach them to the floor with carpet tape. Make sure that you have a light switch at the top of the stairs and the bottom of the stairs. If you do not have them, ask someone to add them for you. What else  can I do to help prevent falls? Wear shoes that: Do not have high heels. Have rubber bottoms. Are comfortable and fit you well. Are closed at the toe. Do not wear sandals. If you use a stepladder: Make sure that it is fully opened. Do not climb a closed stepladder. Make sure that both sides of the stepladder are locked into place. Ask someone to hold it for you, if possible. Clearly mark and make sure that you can see: Any grab bars or handrails. First and last steps. Where the edge of each step is. Use tools that help you move around (mobility aids) if they are needed. These include: Canes. Walkers. Scooters. Crutches. Turn on the lights when you go into a dark area. Replace any light bulbs as soon as they burn out. Set up your furniture so you have a clear path. Avoid moving your furniture around. If any of your floors are uneven, fix them. If there are any pets around you, be aware of where they are. Review your medicines with your doctor. Some medicines can make you feel dizzy. This can increase your chance of falling. Ask your doctor what other things that you can do to help prevent falls. This information is not intended to replace advice given to you by your health care provider. Make sure you discuss any questions you have with your health care provider. Document Released: 06/01/2009 Document Revised: 01/11/2016 Document Reviewed: 09/09/2014 Elsevier Interactive Patient Education  2017 [employer].

## 2022-07-18 NOTE — Progress Notes (Signed)
Subjective:   [name] is a 71 y.o. female who presents for Medicare Annual (Subsequent) preventive examination.  Review of Systems    Virtual Visit via Telephone Note  I connected with  [name] on 07/18/22 at 12:30 PM EST by telephone and verified that I am speaking with the correct person using two identifiers.  Location: Patient: Home  Provider: Office Persons participating in the virtual visit: patient/Nurse Health Advisor   I discussed the limitations, risks, security and privacy concerns of performing an evaluation and management service by telephone and the availability of in person appointments. The patient expressed understanding and agreed to proceed.  Interactive audio and video telecommunications were attempted between this nurse and patient, however failed, due to patient having technical difficulties OR patient did not have access to video capability.  We continued and completed visit with audio only.  Some vital signs may be absent or patient reported.   Criselda Peaches, LPN  Cardiac Risk Factors include: advanced age (>48mn, >>38women);hypertension     Objective:    Today's Vitals   07/18/22 1254  Weight: 262 lb (118.8 kg)  Height: '5\' 6"'$  (1.676 m)   Body mass index is 42.29 kg/m.     07/18/2022    1:01 PM 04/16/2022    9:59 AM 01/02/2022    8:15 PM 11/22/2021    8:51 AM 11/02/2021    2:44 PM 11/02/2021    2:41 PM 10/10/2021   11:00 PM  Advanced Directives  Does Patient Have a Medical Advance Directive? No No No No No No No  Would patient like information on creating a medical advance directive? No - Patient declined No - Patient declined No - Patient declined No - Patient declined   No - Patient declined    Current Medications (verified) Outpatient Encounter Medications as of 07/18/2022  Medication Sig   acetaminophen (TYLENOL) 325 MG tablet Take 650 mg by mouth daily. Arthritis   apixaban (ELIQUIS) 5 MG TABS tablet Take 1 tablet by  mouth 2  times daily.   BIOTIN PO Take 1 tablet by mouth daily.   Cholecalciferol (VITAMIN D) 125 MCG (5000 UT) CAPS Take 5,000 Units by mouth daily.   diclofenac sodium (VOLTAREN) 1 % GEL Apply 2 g topically 4 (four) times daily as needed (pain).   fexofenadine (ALLEGRA) 180 MG tablet Take 180 mg by mouth daily as needed (allergies).   furosemide (LASIX) 20 MG tablet Take 2 - 3 tablets by mouth 2 times daily as needed for edema. (need appt for refills)   gabapentin (NEURONTIN) 100 MG capsule TAKE 3 CAPSULES IN THE MORNING AND 2 CAPSULES AT BEDTIME. OK TO TAKE EXTRA CAPSULE AT BEDTIME AS NEEDED   hydroxychloroquine (PLAQUENIL) 200 MG tablet Take 1 tablet by mouth 2 times a day with food or milk   hydroxypropyl methylcellulose / hypromellose (ISOPTO TEARS / GONIOVISC) 2.5 % ophthalmic solution Place 1-2 drops into both eyes daily.   losartan (COZAAR) 100 MG tablet TAKE 1 TABLET BY MOUTH ONCE A DAY   Menthol, Topical Analgesic, (BIOFREEZE) 4 % GEL Apply 1 application. topically daily as needed (pain).   metoprolol succinate (TOPROL-XL) 50 MG 24 hr tablet Take 1 tablet (50 mg total) by mouth 2 (two) times daily.   metoprolol tartrate (LOPRESSOR) 25 MG tablet Take 1 tablet  by mouth 3 times daily as needed (for heart rate greater than 110 bpm).   Multiple Vitamins-Minerals (MULTIVITAMIN WITH MINERALS) tablet Take 2 tablets by  mouth daily.   potassium chloride SA (KLOR-CON M) 20 MEQ tablet Take 1 tablet by mouth 2 times daily.   sulfaSALAzine (AZULFIDINE) 500 MG tablet Take 2 tablets by mouth twice a day.   vitamin C (ASCORBIC ACID) 500 MG tablet Take 500 mg by mouth daily.   No facility-administered encounter medications on file as of 07/18/2022.    Allergies (verified) Amlodipine besylate, Depakote [divalproex sodium], Nortriptyline, Crestor [rosuvastatin], Dilaudid [hydromorphone hcl], and Tramadol   History: Past Medical History:  Diagnosis Date   Allergy    Cataract    BILATERAL-REMOVED  2 YEARS AGO   Erythromelalgia ([place])    followed by neuro Dr Posey Pronto , mgd on gabapentin , dx several years ago    GERD (gastroesophageal reflux disease)    Helicobacter pylori gastritis 10/05/2018   Hx of colonic polyp - ssp 11/03/2014   Hypercalcemia    Hypertension    Left maxillary fracture ([place]) 07/17/2017   fell down my stairs 2 year ago , deneis any metal in place nor difficulty with jax extension    Neuropathy    Osteoarthritis of hand 10/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   Osteopenia 10/17/2011   DEXA 09/2007: -1.4 L fem; 10/2011: -1.2 L fem    Osteoporosis    PONV (postoperative nausea and vomiting)    Pseudogout of foot    Rheumatoid arthritis(714.0) dx 2010   Shingles    hx of    Tibial plateau fracture, right, closed, initial encounter 07/16/2017   Past Surgical History:  Procedure Laterality Date   ATRIAL FIBRILLATION ABLATION N/A 04/16/2022   Procedure: ATRIAL FIBRILLATION ABLATION;  Surgeon: Vickie Epley, MD;  Location: [facility];  Service: Cardiovascular;  Laterality: N/A;   BREAST BIOPSY  1972   Broken wrist  2010   CARDIOVERSION N/A 11/22/2021   Procedure: CARDIOVERSION;  Surgeon: Geralynn Rile, MD;  Location: [facility];  Service: Cardiovascular;  Laterality: N/A;   CATARACT EXTRACTION  03/2012   left   COLONOSCOPY     COSMETIC SURGERY     FRACTURE SURGERY     INNER EAR SURGERY     busted ear drum   MAXIMUM ACCESS (MAS)POSTERIOR LUMBAR INTERBODY FUSION (PLIF) 1 LEVEL N/A 10/11/2016   Procedure: Lumbar one-Sacral one Maximum access posterior lumbar interbody fusion;  Surgeon: Erline Levine, MD;  Location: [facility];  Service: Neurosurgery;  Laterality: N/A;   ORIF WRIST FRACTURE Right 07/18/2017   Procedure: OPEN REDUCTION INTERNAL FIXATION (ORIF) WRIST FRACTURE;  Surgeon: Renette Butters, MD;  Location: [facility];  Service: Orthopedics;  Laterality: Right;   PARATHYROIDECTOMY Left 04/12/2019   Procedure: LEFT SUPERIOR PARATHYROIDECTOMY;  Surgeon: [name], MD;  Location: [facility];  Service: General;  Laterality: Left;   pneumonia  2007   Family History  Problem Relation Age of Onset   Stroke Mother    Hypertension Father    Heart attack Father    Hypertension Sister        x 3   Hypertension Brother    Hyperthyroidism Sister        x2, s/p RAI ablation   Hypothyroidism Brother    Colon cancer Neg Hx    Esophageal cancer Neg Hx    Rectal cancer Neg Hx    Stomach cancer Neg Hx    Social History   Socioeconomic History   Marital status: Married    Spouse name: Not on file   Number of children: 3   Years of education: Not on file  Highest education level: 11th grade  Occupational History   Occupation: Retired  Tobacco Use   Smoking status: Former    Packs/day: 4.00    Years: 4.00    Total pack years: 16.00    Types: Cigarettes    Start date: 4    Quit date: 1985    Years since quitting: 38.9   Smokeless tobacco: Never  Vaping Use   Vaping Use: Never used  Substance and Sexual Activity   Alcohol use: No    Alcohol/week: 0.0 standard drinks of alcohol   Drug use: No   Sexual activity: Not on file  Other Topics Concern   Not on file  Social History Narrative   Artist -retired Building control surveyor   Married, lives with spouse, [name], he is IT support for [employer]   3 sons   2 caffeinated beverages a day   No regular exercise, diet is ok   Right handed   Social Determinants of Health   Financial Resource Strain: Low Risk  (07/18/2022)   Overall Financial Resource Strain (CARDIA)    Difficulty of Paying Living Expenses: Not hard at all  Food Insecurity: No Food Insecurity (07/18/2022)   Hunger Vital Sign    Worried About Running Out of Food in the Last Year: Never true    [place] in the Last Year: Never true  Transportation Needs: No Transportation Needs (07/18/2022)   PRAPARE - Hydrologist (Medical): No    Lack of Transportation (Non-Medical): No  Physical Activity: Inactive  (07/18/2022)   Exercise Vital Sign    Days of Exercise per Week: 0 days    Minutes of Exercise per Session: 0 min  Stress: No Stress Concern Present (07/18/2022)   Ages    Feeling of Stress : Not at all  Social Connections: [place] (07/18/2022)   Social Connection and Isolation Panel [NHANES]    Frequency of Communication with Friends and Family: More than three times a week    Frequency of Social Gatherings with Friends and Family: More than three times a week    Attends Religious Services: More than 4 times per year    Active Member of [employer] or Organizations: Yes    Attends Music therapist: More than 4 times per year    Marital Status: Married    Tobacco Counseling Counseling given: Not Answered   Clinical Intake:  Pre-visit preparation completed: No  Pain : No/denies pain     BMI - recorded: 42.29 Nutritional Status: BMI > 30  Obese Nutritional Risks: None Diabetes: No  How often do you need to have someone help you when you read instructions, pamphlets, or other written materials from your doctor or pharmacy?: 1 - Never  Diabetic? No  Interpreter Needed?: No  Information entered by :: Rolene Arbour LPN   Activities of Daily Living    07/18/2022    1:00 PM 10/10/2021   11:00 PM  In your present state of health, do you have any difficulty performing the following activities:  Hearing? 0 0  Vision? 0 0  Difficulty concentrating or making decisions? 0 0  Walking or climbing stairs? 0 0  Dressing or bathing? 0 0  Doing errands, shopping? 0 0  Preparing Food and eating ? N   Using the Toilet? N   In the past six months, have you accidently leaked urine? N   Do  you have problems with loss of bowel control? N   Managing your Medications? N   Managing your Finances? N   Housekeeping or managing your Housekeeping? N     Patient Care Team: Farrel Conners, MD  as PCP - General (Family Medicine) Vickie Epley, MD as PCP - Electrophysiology (Cardiology) Thornell Sartorius, MD (Otolaryngology) Earlean Polka, MD (Ophthalmology) Hennie Duos, MD as Consulting Physician (Rheumatology) Alda Berthold, DO as Consulting Physician (Neurology) Philemon Kingdom, MD as Consulting Physician (Internal Medicine)  Indicate any recent Medical Services you may have received from other than [facility] providers in the past year (date may be approximate).     Assessment:   This is a routine wellness examination for [place].  Hearing/Vision screen Hearing Screening - Comments:: Denies hearing difficulties   Vision Screening - Comments::  up to date with routine eye exams with  Justice issues and exercise activities discussed: Exercise limited by: orthopedic condition(s)   Goals Addressed               This Visit's Progress     Patient stated (pt-stated)        Get out in my yard and work       Depression Screen    07/18/2022   12:59 PM 02/13/2022   12:49 PM 01/10/2021    1:03 PM 05/28/2018    1:57 PM 01/13/2018   10:16 AM 12/26/2016   10:31 AM 05/10/2013    4:32 PM  PHQ 2/9 Scores  PHQ - 2 Score 0 0 0 0 0 0 0  PHQ- 9 Score  0 0        Fall Risk    07/18/2022    1:00 PM 04/01/2022   11:17 AM 02/12/2022    9:58 AM 11/02/2021    2:41 PM 11/17/2020    9:00 AM  [place] in the past year? 0 0 0 0 0  Number falls in past yr: 0 0  0 0  Injury with Fall? 0 0  0 0  Risk for fall due to : No Fall Risks No Fall Risks     Follow up Falls prevention discussed Falls evaluation completed       FALL RISK PREVENTION PERTAINING TO THE HOME:  Any stairs in or around the home? Yes  If so, are there any without handrails? No  Home free of loose throw rugs in walkways, pet beds, electrical cords, etc? Yes  Adequate lighting in your home to reduce risk of falls? Yes   ASSISTIVE DEVICES UTILIZED TO PREVENT FALLS:  Life  alert? No  Use of a cane, walker or w/c? No  Grab bars in the bathroom? Yes  Shower chair or bench in shower? Yes  Elevated toilet seat or a handicapped toilet? No   TIMED UP AND GO:  Was the test performed? No . Audio Visit   Cognitive Function:        07/18/2022    1:01 PM  6CIT Screen  What Year? 0 points  What month? 0 points  What time? 0 points  Count back from 20 0 points  Months in reverse 0 points  Repeat phrase 0 points  Total Score 0 points    Immunizations Immunization History  Administered Date(s) Administered   Fluad Quad(high Dose 65+) 05/22/2019   Influenza, High Dose Seasonal PF 05/31/2016, 07/04/2017, 06/04/2018   Influenza,inj,Quad PF,6+ Mos 09/15/2014, 05/04/2015   Pneumococcal Conjugate-13 09/08/2015  Pneumococcal Polysaccharide-23 04/09/2011, 09/12/2016   Tdap 05/31/2016      Flu Vaccine status: Declined, Education has been provided regarding the importance of this vaccine but patient still declined. Advised may receive this vaccine at local pharmacy or Health Dept. Aware to provide a copy of the vaccination record if obtained from local pharmacy or Health Dept. Verbalized acceptance and understanding.  Pneumococcal vaccine status: Up to date  Covid-19 vaccine status: Declined, Education has been provided regarding the importance of this vaccine but patient still declined. Advised may receive this vaccine at local pharmacy or Health Dept.or vaccine clinic. Aware to provide a copy of the vaccination record if obtained from local pharmacy or Health Dept. Verbalized acceptance and understanding.  Qualifies for Shingles Vaccine? Yes   Zostavax completed No   Shingrix Completed?: No.    Education has been provided regarding the importance of this vaccine. Patient has been advised to call insurance company to determine out of pocket expense if they have not yet received this vaccine. Advised may also receive vaccine at local pharmacy or Health Dept.  Verbalized acceptance and understanding.  Screening Tests Health Maintenance  Topic Date Due   COVID-19 Vaccine (1) 08/03/2022 (Originally 07/04/1951)   Zoster Vaccines- Shingrix (1 of 2) 10/17/2022 (Originally 01/02/1999)   INFLUENZA VACCINE  11/17/2022 (Originally 03/19/2022)   MAMMOGRAM  07/19/2023 (Originally 02/06/2020)   Medicare Annual Wellness (AWV)  07/19/2023   COLONOSCOPY (Pts 45-32yr Insurance coverage will need to be confirmed)  10/01/2023   DTaP/Tdap/Td (2 - Td or Tdap) 05/31/2026   Pneumonia Vaccine 71 Years old  Completed   DEXA SCAN  Completed   Hepatitis C Screening  Completed   HPV VACCINES  Aged Out    Health Maintenance  There are no preventive care reminders to display for this patient.   Colorectal cancer screening: Type of screening: Colonoscopy. Completed 09/30/18. Repeat every 5 years  Mammogram status: Ordered Patient deferred. Pt provided with contact info and advised to call to schedule appt.   Bone Density status: Completed 06/24/22. Results reflect: Bone density results: OSTEOPOROSIS. Repeat every   years.  Lung Cancer Screening: (Low Dose CT Chest recommended if Age 73-80years, 30 pack-year currently smoking OR have quit w/in 15years.) does not qualify.     Additional Screening:  Hepatitis C Screening: does qualify; Completed 09/12/16  Vision Screening: Recommended annual ophthalmology exams for early detection of glaucoma and other disorders of the eye. Is the patient up to date with their annual eye exam?  Yes  Who is the provider or what is the name of the office in which the patient attends annual eye exams? [facility] pt is not established with a provider, would they like to be referred to a provider to establish care? No .   Dental Screening: Recommended annual dental exams for proper oral hygiene  Community Resource Referral / Chronic Care Management:  CRR required this visit?  No   CCM required this visit?  No       Plan:     I have personally reviewed and noted the following in the patient's chart:   Medical and social history Use of alcohol, tobacco or illicit drugs  Current medications and supplements including opioid prescriptions. Patient is not currently taking opioid prescriptions. Functional ability and status Nutritional status Physical activity Advanced directives List of other physicians Hospitalizations, surgeries, and ER visits in previous 12 months Vitals Screenings to include cognitive, depression, and falls Referrals and appointments  In addition,  I have reviewed and discussed with patient certain preventive protocols, quality metrics, and best practice recommendations. A written personalized care plan for preventive services as well as general preventive health recommendations were provided to patient.     Criselda Peaches, LPN   [id number]   Nurse Notes: None

## 2022-07-22 ENCOUNTER — Other Ambulatory Visit (HOSPITAL_COMMUNITY): Payer: Self-pay

## 2022-07-23 ENCOUNTER — Telehealth: Payer: Self-pay

## 2022-07-23 NOTE — Telephone Encounter (Signed)
The patient wishes to proceed with [facility] on 10/03/2022. Pre-procedure visit scheduled 09/16/2022. She was grateful for call and agreed with plan.

## 2022-07-24 ENCOUNTER — Telehealth: Payer: Self-pay

## 2022-07-24 ENCOUNTER — Other Ambulatory Visit: Payer: Self-pay

## 2022-07-24 DIAGNOSIS — I4819 Other persistent atrial fibrillation: Secondary | ICD-10-CM

## 2022-07-29 DIAGNOSIS — H6123 Impacted cerumen, bilateral: Secondary | ICD-10-CM | POA: Diagnosis not present

## 2022-07-30 ENCOUNTER — Other Ambulatory Visit: Payer: Self-pay | Admitting: Family

## 2022-07-31 ENCOUNTER — Other Ambulatory Visit (HOSPITAL_COMMUNITY): Payer: Self-pay

## 2022-07-31 ENCOUNTER — Other Ambulatory Visit: Payer: Self-pay | Admitting: Family

## 2022-08-01 ENCOUNTER — Other Ambulatory Visit (HOSPITAL_COMMUNITY): Payer: Self-pay

## 2022-08-02 ENCOUNTER — Other Ambulatory Visit: Payer: Self-pay | Admitting: Family Medicine

## 2022-08-02 ENCOUNTER — Other Ambulatory Visit (HOSPITAL_COMMUNITY): Payer: Self-pay

## 2022-08-02 MED ORDER — LOSARTAN POTASSIUM 100 MG PO TABS
100.0000 mg | ORAL_TABLET | Freq: Every day | ORAL | 1 refills | Status: DC
  Filled 2022-08-02: qty 90, 90d supply, fill #0
  Filled 2022-10-29: qty 90, 90d supply, fill #1

## 2022-08-05 ENCOUNTER — Other Ambulatory Visit (HOSPITAL_COMMUNITY): Payer: Self-pay

## 2022-08-05 DIAGNOSIS — M545 Low back pain, unspecified: Secondary | ICD-10-CM | POA: Diagnosis not present

## 2022-08-05 DIAGNOSIS — G8929 Other chronic pain: Secondary | ICD-10-CM | POA: Diagnosis not present

## 2022-08-05 DIAGNOSIS — M069 Rheumatoid arthritis, unspecified: Secondary | ICD-10-CM | POA: Diagnosis not present

## 2022-08-05 MED ORDER — DICLOFENAC SODIUM 1 % EX GEL
CUTANEOUS | 11 refills | Status: DC
Start: 2022-08-05 — End: 2022-12-24
  Filled 2022-08-05: qty 100, 15d supply, fill #0
  Filled 2022-08-28: qty 100, 15d supply, fill #1

## 2022-08-06 ENCOUNTER — Other Ambulatory Visit: Payer: Self-pay | Admitting: Family Medicine

## 2022-08-06 ENCOUNTER — Other Ambulatory Visit (HOSPITAL_COMMUNITY): Payer: Self-pay

## 2022-08-06 ENCOUNTER — Other Ambulatory Visit: Payer: Self-pay

## 2022-08-06 DIAGNOSIS — R600 Localized edema: Secondary | ICD-10-CM

## 2022-08-06 MED ORDER — FUROSEMIDE 20 MG PO TABS
40.0000 mg | ORAL_TABLET | Freq: Two times a day (BID) | ORAL | 1 refills | Status: DC | PRN
  Filled 2022-08-06: qty 100, 17d supply, fill #0
  Filled 2022-08-27: qty 100, 17d supply, fill #1

## 2022-08-14 DIAGNOSIS — J9601 Acute respiratory failure with hypoxia: Secondary | ICD-10-CM | POA: Diagnosis not present

## 2022-08-19 DIAGNOSIS — C539 Malignant neoplasm of cervix uteri, unspecified: Secondary | ICD-10-CM

## 2022-08-19 HISTORY — DX: Malignant neoplasm of cervix uteri, unspecified: C53.9

## 2022-08-22 ENCOUNTER — Other Ambulatory Visit: Payer: Self-pay

## 2022-08-26 ENCOUNTER — Other Ambulatory Visit: Payer: Self-pay | Admitting: Family Medicine

## 2022-08-26 ENCOUNTER — Other Ambulatory Visit (HOSPITAL_COMMUNITY): Payer: Self-pay

## 2022-08-27 ENCOUNTER — Telehealth: Payer: Self-pay | Admitting: Family Medicine

## 2022-08-27 ENCOUNTER — Other Ambulatory Visit (HOSPITAL_COMMUNITY): Payer: Self-pay

## 2022-08-27 MED ORDER — POTASSIUM CHLORIDE CRYS ER 20 MEQ PO TBCR
20.0000 meq | EXTENDED_RELEASE_TABLET | Freq: Two times a day (BID) | ORAL | 1 refills | Status: DC
  Filled 2022-08-27: qty 180, 90d supply, fill #0
  Filled 2022-11-26: qty 180, 90d supply, fill #1

## 2022-08-27 NOTE — Telephone Encounter (Signed)
furosemide (LASIX) 20 MG tablet patient going out of town for extended period and wants to have this before she leaves   [place] Phone: [phone]  Fax: [phone]

## 2022-08-27 NOTE — Telephone Encounter (Signed)
I filled it on 08/16/22, 100 tablets

## 2022-08-27 NOTE — Telephone Encounter (Signed)
Spoke with Jerene Pitch at [facility] and informed her of the message below.  She stated the Rx was received, will be filled and they will contact the patient.

## 2022-08-28 ENCOUNTER — Other Ambulatory Visit: Payer: Self-pay

## 2022-08-28 ENCOUNTER — Other Ambulatory Visit (HOSPITAL_COMMUNITY): Payer: Self-pay

## 2022-08-28 DIAGNOSIS — R319 Hematuria, unspecified: Secondary | ICD-10-CM | POA: Diagnosis not present

## 2022-09-03 ENCOUNTER — Other Ambulatory Visit (HOSPITAL_COMMUNITY): Payer: Self-pay

## 2022-09-03 ENCOUNTER — Other Ambulatory Visit: Payer: Self-pay | Admitting: Cardiology

## 2022-09-03 DIAGNOSIS — Z01812 Encounter for preprocedural laboratory examination: Secondary | ICD-10-CM

## 2022-09-03 DIAGNOSIS — R0683 Snoring: Secondary | ICD-10-CM

## 2022-09-03 DIAGNOSIS — R400 Somnolence: Secondary | ICD-10-CM

## 2022-09-03 DIAGNOSIS — I4819 Other persistent atrial fibrillation: Secondary | ICD-10-CM

## 2022-09-03 MED ORDER — APIXABAN 5 MG PO TABS
5.0000 mg | ORAL_TABLET | Freq: Two times a day (BID) | ORAL | 5 refills | Status: DC
  Filled 2022-09-03: qty 60, 30d supply, fill #0
  Filled 2022-09-30: qty 60, 30d supply, fill #1

## 2022-09-03 NOTE — Telephone Encounter (Signed)
Eliquis '5mg'$  refill request received. Patient is 72 years old, weight-118.8kg, Crea-1.13 on 03/28/2022, Diagnosis-Afib, and last seen by Dr. Quentin Ore on 07/15/2022. Dose is appropriate based on dosing criteria. Will send in refill to requested pharmacy.

## 2022-09-11 NOTE — Progress Notes (Unsigned)
[facility]                                     Cardiology Office Note:    Date:  09/16/2022   ID:  Siona, Coulston [birth year], MRN [id number]  PCP:  Farrel Conners, MD  [facility] Cardiologist:  None  [facility] Electrophysiologist:  Vickie Epley, MD   Referring MD: Farrel Conners, MD   Chief Complaint  Patient presents with   Follow-up    Pre LAAO    History of Present Illness:    [name] is a 72 y.o. female with a hx of persistent atrial fibrillation s/p AF ablation 04/16/22 with Dr. Quentin Ore and chronic arthritis. She was seen by the [facility] 05/2022 and was referred to Dr. Quentin Ore for ablation. Given her joint pain with arthritis, she was interested in pursuing LAAO closure to allow for NSAIDS without the increased risk of bleeding associated with concomitant anticoagulant use.  Pre ablation CT reviewed which showed anatomy suitable for Watchman implant.   Today she is here with her husband and reports that she has been doing very well. She is a little anxious about her procedure however is ready to move forward. She denies chest pain, SOB, palpitations, orthopnea, LE edema, dizziness, or syncope.   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   Osteopenia 10/17/2011   DEXA 09/2007: -1.4 L fem; 10/2011: -1.2 L fem    Osteoporosis    PONV (postoperative nausea and vomiting)    Pseudogout of foot    Rheumatoid arthritis(714.0) dx 2010    Shingles    hx of    Tibial plateau fracture, right, closed, initial encounter 07/16/2017    Past Surgical History:  Procedure Laterality Date   ATRIAL FIBRILLATION ABLATION N/A 04/16/2022   Procedure: ATRIAL FIBRILLATION ABLATION;  Surgeon: Vickie Epley, MD;  Location: [place] [facility];  Service: Cardiovascular;  Laterality: N/A;   BREAST BIOPSY  1972   Broken wrist  2010   CARDIOVERSION N/A 11/22/2021   Procedure: CARDIOVERSION;  Surgeon: Geralynn Rile, MD;  Location: [place];  Service: Cardiovascular;  Laterality: N/A;   CATARACT EXTRACTION  03/2012   left   COLONOSCOPY     COSMETIC SURGERY     FRACTURE SURGERY     INNER EAR SURGERY     busted ear drum   MAXIMUM ACCESS (MAS)POSTERIOR LUMBAR INTERBODY FUSION (PLIF) 1 LEVEL N/A 10/11/2016   Procedure: Lumbar one-Sacral one Maximum access posterior lumbar interbody fusion;  Surgeon: Erline Levine, MD;  Location: [place];  Service: Neurosurgery;  Laterality: N/A;   ORIF WRIST FRACTURE Right 07/18/2017   Procedure: OPEN REDUCTION INTERNAL FIXATION (ORIF) WRIST FRACTURE;  Surgeon: Renette Butters, MD;  Location: [place];  Service: Orthopedics;  Laterality: Right;   PARATHYROIDECTOMY Left 04/12/2019   Procedure: LEFT SUPERIOR PARATHYROIDECTOMY;  Surgeon: [name], MD;  Location: [facility];  Service: General;  Laterality: Left;   pneumonia  2007    Current Medications: Current Meds  Medication Sig   acetaminophen (TYLENOL) 325 MG tablet Take 650 mg by mouth daily. Arthritis   apixaban (ELIQUIS) 5 MG TABS tablet Take 1 tablet by mouth 2  times daily.   BIOTIN PO Take 1 tablet by mouth daily.   Cholecalciferol (VITAMIN D) 125 MCG (5000 UT) CAPS Take 5,000 Units by mouth daily.   diclofenac sodium (VOLTAREN) 1 % GEL Apply 2 g topically 4 (four) times daily as needed (pain).   diclofenac Sodium (VOLTAREN) 1 % GEL Apply to affected area 2 times a day   fexofenadine (ALLEGRA) 180 MG tablet Take 180 mg by mouth daily as needed  (allergies).   furosemide (LASIX) 20 MG tablet Take 2 - 3 tablets by mouth 2 times daily as needed for edema. (need appt for refills)   gabapentin (NEURONTIN) 100 MG capsule TAKE 3 CAPSULES IN THE MORNING AND 2 CAPSULES AT BEDTIME. OK TO TAKE EXTRA CAPSULE AT BEDTIME AS NEEDED   hydroxychloroquine (PLAQUENIL) 200 MG tablet Take 1 tablet by mouth 2 times a day with food or milk   hydroxypropyl methylcellulose / hypromellose (ISOPTO TEARS / GONIOVISC) 2.5 % ophthalmic solution Place 1-2 drops into both eyes daily.   losartan (COZAAR) 100 MG tablet TAKE 1 TABLET BY MOUTH ONCE A DAY   Menthol, Topical Analgesic, (BIOFREEZE) 4 % GEL Apply 1 application. topically daily as needed (pain).   metoprolol succinate (TOPROL-XL) 50 MG 24 hr tablet Take 1 tablet (50 mg total) by mouth 2 (two) times daily.   metoprolol tartrate (LOPRESSOR) 25 MG tablet Take 1 tablet  by mouth 3 times daily as needed (for heart rate greater than 110 bpm).   Multiple Vitamins-Minerals (MULTIVITAMIN WITH MINERALS) tablet Take 2 tablets by mouth daily.   potassium chloride SA (KLOR-CON M) 20 MEQ tablet Take 1 tablet by mouth 2 times daily.   sulfaSALAzine (AZULFIDINE) 500 MG tablet Take 2 tablets by mouth twice a day.   vitamin C (ASCORBIC ACID) 500 MG tablet Take 500 mg by mouth daily.    Allergies:   Amlodipine besylate, Divalproex sodium, Nortriptyline, Hydromorphone hcl, Rosuvastatin, and Tramadol   Social History   Socioeconomic History   Marital status: Married    Spouse name: Not on file   Number of children: 3   Years of education: Not on file   Highest education level: 11th grade  Occupational History   Occupation: Retired  Tobacco Use   Smoking status: Former    Packs/day: 4.00    Years: 4.00    Total pack years: 16.00    Types: Cigarettes    Start date: 4    Quit date: 1985    Years since quitting: 39.1   Smokeless tobacco: Never  Vaping Use   Vaping Use: Never used  Substance and Sexual Activity    Alcohol use: No    Alcohol/week: 0.0 standard drinks of alcohol   Drug use: No   Sexual activity: Not on file  Other Topics Concern   Not on file  Social History Narrative   Artist -retired Building control surveyor   Married, lives with spouse, [name], he is IT support for [employer]   3 sons   2 caffeinated beverages a day   No regular exercise, diet is  ok   Right handed   Social Determinants of Health   Financial Resource Strain: Low Risk  (07/18/2022)   Overall Financial Resource Strain (CARDIA)    Difficulty of Paying Living Expenses: Not hard at all  Food Insecurity: No Food Insecurity (07/18/2022)   Hunger Vital Sign    Worried About Running Out of Food in the Last Year: Never true    Ran Out of Food in the Last Year: Never true  Transportation Needs: No Transportation Needs (07/18/2022)   PRAPARE - Hydrologist (Medical): No    Lack of Transportation (Non-Medical): No  Physical Activity: Inactive (07/18/2022)   Exercise Vital Sign    Days of Exercise per Week: 0 days    Minutes of Exercise per Session: 0 min  Stress: No Stress Concern Present (07/18/2022)   [place]    Feeling of Stress : Not at all  Social Connections: [place] (07/18/2022)   Social Connection and Isolation Panel [NHANES]    Frequency of Communication with Friends and Family: More than three times a week    Frequency of Social Gatherings with Friends and Family: More than three times a week    Attends Religious Services: More than 4 times per year    Active Member of [employer] or Organizations: Yes    Attends Music therapist: More than 4 times per year    Marital Status: Married    Family History: The patient's family history includes Heart attack in her father; Hypertension in her brother, father, and sister; Hyperthyroidism in her sister; Hypothyroidism in her brother;  Stroke in her mother. There is no history of Colon cancer, Esophageal cancer, Rectal cancer, or Stomach cancer.  ROS:   Please see the history of present illness.    All other systems reviewed and are negative.  EKGs/Labs/Other Studies Reviewed:    The following studies were reviewed today:  Cardiac CT pulm morph 04/11/22:  IMPRESSION: 1.  Moderate LAE No LAA thrombus   2.  No ASD/PFO   3.  Normal PV anatomy measurements above   4.  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  No pericardial effusion   6.  Calcium score 51.4 which is 55 th percentile for age/sex  EKG:  EKG is  ordered today.  The ekg ordered today demonstrates NSR with HR 70bpm.   Recent Labs: 10/10/2021: B Natriuretic Peptide 428.4 10/13/2021: Magnesium 2.1 11/05/2021: ALT 13 02/13/2022: TSH 1.62 03/28/2022: BUN 20; Creatinine, Ser 1.13; Hemoglobin 13.8; Platelets 214; Potassium 3.9; Sodium 141   Recent Lipid Panel    Component Value Date/Time   CHOL 160 08/29/2021 1352   CHOL 225 (H) 06/21/2014 1146   TRIG 149.0 08/29/2021 1352   TRIG 169 (H) 06/21/2014 1146   HDL 42.30 08/29/2021 1352   HDL 48 06/21/2014 1146   CHOLHDL 4 08/29/2021 1352   VLDL 29.8 08/29/2021 1352   LDLCALC 88 08/29/2021 1352   LDLCALC 158 (H) 03/31/2020 0845   LDLCALC 143 (H) 06/21/2014 1146   LDLDIRECT 153.0 05/29/2021 1109   Risk Assessment/Calculations:    HAS-BLED score 3 Hypertension Yes  Abnormal renal and liver function (Dialysis, transplant, Cr >2.26 mg/dL /Cirrhosis or Bilirubin >2x Normal or AST/ALT/AP >3x Normal) No  Stroke No  Bleeding No  Labile INR (Unstable/high INR) No  Elderly (>65) Yes  Drugs or alcohol (? 8 drinks/week, anti-plt or NSAID) Yes    CHA2DS2-VASc  Score = 4  The patient's score is based upon: CHF History: 1 HTN History: 1 Diabetes History: 0 Stroke History: 0 Vascular Disease History: 0 Age Score: 1 Gender Score: 1  Physical Exam:    VS:  Ht '5\' 6"'$  (1.676 m)   BMI 42.29  kg/m     Wt Readings from Last 3 Encounters:  07/18/22 262 lb (118.8 kg)  07/15/22 265 lb (120.2 kg)  05/30/22 265 lb 12.8 oz (120.6 kg)    General: Well developed, well nourished, NAD Lungs:Clear to ausculation bilaterally. No wheezes, rales, or rhonchi. Breathing is unlabored. Cardiovascular: RRR with S1 S2. No murmurs Extremities: No edema.  Neuro: Alert and oriented. No focal deficits. No facial asymmetry. MAE spontaneously. Psych: Responds to questions appropriately with normal affect.    ASSESSMENT/PLAN:    Persistent atrial fibrillation: Underwent AF ablation 03/2022 and is scheduled for LAAO closure with Watchman 10/03/22. She is doing well with NYHA class I symptoms. Instruction letter reviewed with all questions answered. CHG soap given. Obtain CBC, BMET today.   Chronic arthritis: Unable to take NSAIDs given the need for anticoagulation. As above, wishes to move forward with LAAO closure to avoid long term anticoagulation.   Medication Adjustments/Labs and Tests Ordered: Current medicines are reviewed at length with the patient today.  Concerns regarding medicines are outlined above.  No orders of the defined types were placed in this encounter.  No orders of the defined types were placed in this encounter.   There are no Patient Instructions on file for this visit.   Signed, Kathyrn Drown, NP  09/16/2022 10:41 AM    [place]

## 2022-09-14 DIAGNOSIS — J9601 Acute respiratory failure with hypoxia: Secondary | ICD-10-CM | POA: Diagnosis not present

## 2022-09-16 ENCOUNTER — Ambulatory Visit: Payer: Medicare Other | Attending: Cardiology | Admitting: Cardiology

## 2022-09-16 VITALS — BP 151/82 | HR 70 | Ht 66.0 in | Wt 268.2 lb

## 2022-09-16 DIAGNOSIS — Z01812 Encounter for preprocedural laboratory examination: Secondary | ICD-10-CM | POA: Diagnosis not present

## 2022-09-16 DIAGNOSIS — I5032 Chronic diastolic (congestive) heart failure: Secondary | ICD-10-CM | POA: Diagnosis not present

## 2022-09-16 DIAGNOSIS — Z9889 Other specified postprocedural states: Secondary | ICD-10-CM | POA: Diagnosis not present

## 2022-09-16 DIAGNOSIS — I4819 Other persistent atrial fibrillation: Secondary | ICD-10-CM | POA: Diagnosis not present

## 2022-09-16 NOTE — Patient Instructions (Signed)
Medication Instructions:  Your physician recommends that you continue on your current medications as directed. Please refer to the Current Medication list given to you today.  *If you need a refill on your cardiac medications before your next appointment, please call your pharmacy*  Lab Work: Your physician recommends that you have lab work today.  If you have labs (blood work) drawn today and your tests are completely normal, you will receive your results only by: [place] (if you have MyChart) OR A paper copy in the mail If you have any lab test that is abnormal or we need to change your treatment, we will call you to review the results.  Testing/Procedures: None ordered today.  Follow-Up: At [facility], you and your health needs are our priority.  As part of our continuing mission to provide you with exceptional heart care, we have created designated Provider Care Teams.  These Care Teams include your primary Cardiologist (physician) and Advanced Practice Providers (APPs -  Physician Assistants and Nurse Practitioners) who all work together to provide you with the care you need, when you need it.  We recommend signing up for the patient portal called "MyChart".  Sign up information is provided on this After Visit Summary.  MyChart is used to connect with patients for Virtual Visits (Telemedicine).  Patients are able to view lab/test results, encounter notes, upcoming appointments, etc.  Non-urgent messages can be sent to your provider as well.   To learn more about what you can do with MyChart, go to [email or web address].    Your next appointment:   Will be made after your procedure  Provider:   Kathyrn Drown, NP

## 2022-09-17 LAB — BASIC METABOLIC PANEL WITH GFR
BUN/Creatinine Ratio: 11 — ABNORMAL LOW (ref 12–28)
BUN: 12 mg/dL (ref 8–27)
CO2: 27 mmol/L (ref 20–29)
Calcium: 9.7 mg/dL (ref 8.7–10.3)
Chloride: 98 mmol/L (ref 96–106)
Creatinine, Ser: 1.08 mg/dL — ABNORMAL HIGH (ref 0.57–1.00)
Glucose: 109 mg/dL — ABNORMAL HIGH (ref 70–99)
Potassium: 4.2 mmol/L (ref 3.5–5.2)
Sodium: 143 mmol/L (ref 134–144)
eGFR: 54 mL/min/1.73 — ABNORMAL LOW

## 2022-09-17 LAB — CBC
Hematocrit: 41.7 % (ref 34.0–46.6)
Hemoglobin: 14.1 g/dL (ref 11.1–15.9)
MCH: 29.6 pg (ref 26.6–33.0)
MCHC: 33.8 g/dL (ref 31.5–35.7)
MCV: 87 fL (ref 79–97)
Platelets: 229 x10E3/uL (ref 150–450)
RBC: 4.77 x10E6/uL (ref 3.77–5.28)
RDW: 13.2 % (ref 11.7–15.4)
WBC: 8.1 x10E3/uL (ref 3.4–10.8)

## 2022-09-18 ENCOUNTER — Other Ambulatory Visit: Payer: Self-pay | Admitting: Family Medicine

## 2022-09-18 ENCOUNTER — Other Ambulatory Visit (HOSPITAL_COMMUNITY): Payer: Self-pay

## 2022-09-18 DIAGNOSIS — R600 Localized edema: Secondary | ICD-10-CM

## 2022-09-18 MED ORDER — FUROSEMIDE 20 MG PO TABS
40.0000 mg | ORAL_TABLET | Freq: Two times a day (BID) | ORAL | 1 refills | Status: DC | PRN
  Filled 2022-09-18: qty 100, 17d supply, fill #0
  Filled 2022-10-10: qty 100, 17d supply, fill #1

## 2022-09-19 ENCOUNTER — Other Ambulatory Visit: Payer: Self-pay

## 2022-09-19 ENCOUNTER — Other Ambulatory Visit (HOSPITAL_COMMUNITY): Payer: Self-pay

## 2022-09-19 DIAGNOSIS — Z8679 Personal history of other diseases of the circulatory system: Secondary | ICD-10-CM

## 2022-09-19 HISTORY — DX: Personal history of other diseases of the circulatory system: Z86.79

## 2022-09-23 DIAGNOSIS — Z79899 Other long term (current) drug therapy: Secondary | ICD-10-CM | POA: Diagnosis not present

## 2022-09-23 DIAGNOSIS — M069 Rheumatoid arthritis, unspecified: Secondary | ICD-10-CM | POA: Diagnosis not present

## 2022-09-26 ENCOUNTER — Encounter (HOSPITAL_COMMUNITY): Payer: Self-pay | Admitting: *Deleted

## 2022-09-27 ENCOUNTER — Other Ambulatory Visit (HOSPITAL_COMMUNITY): Payer: Self-pay

## 2022-09-30 ENCOUNTER — Telehealth: Payer: Self-pay | Admitting: Cardiology

## 2022-09-30 NOTE — Telephone Encounter (Signed)
Spoke with the patient who states that she spots on and off and wanted to know if there were any concerns with that and upcoming procedure. She denies any excessive bleeding.

## 2022-09-30 NOTE — Telephone Encounter (Signed)
Pt didn't want to give more information. Only that she would like a call back.

## 2022-10-01 ENCOUNTER — Telehealth: Payer: Self-pay

## 2022-10-01 ENCOUNTER — Other Ambulatory Visit (HOSPITAL_COMMUNITY): Payer: Self-pay

## 2022-10-01 ENCOUNTER — Ambulatory Visit: Payer: Medicare Other | Admitting: Family Medicine

## 2022-10-01 MED ORDER — HYDROXYCHLOROQUINE SULFATE 200 MG PO TABS
200.0000 mg | ORAL_TABLET | Freq: Two times a day (BID) | ORAL | 1 refills | Status: DC
  Filled 2022-10-01: qty 180, 90d supply, fill #0
  Filled 2023-02-18: qty 180, 90d supply, fill #1

## 2022-10-01 NOTE — Telephone Encounter (Signed)
Patient is following up, requesting to speak with RN regarding her conversation yesterday. She states she has a question and prefers to speak directly with nurse.

## 2022-10-01 NOTE — Telephone Encounter (Signed)
See above phone note. Will discuss with Dr. Quentin Ore

## 2022-10-01 NOTE — Telephone Encounter (Signed)
Confirmed procedure date of 10/03/2022. Confirmed arrival time of 5:30AM for procedure time at 7:30AM. Reviewed pre-procedure instructions with patient. The patient understands to call if questions/concerns arise prior to procedure.   The patient reports active vaginal spotting (this is not a new problem, and she is scheduled to see GYN in a few weeks). She had what she describes as a "full blown period" amount of blood yesterday for a short amount of time, but she is simply spotting now.  Reiterated to her that as long as her bleeding doesn't worsen, she may be able to still proceed with LAAO 2/15. She understands the plan to proceed with LAAO will be confirmed with Dr. Quentin Ore.

## 2022-10-02 ENCOUNTER — Encounter (HOSPITAL_COMMUNITY): Payer: Self-pay | Admitting: Cardiology

## 2022-10-02 ENCOUNTER — Other Ambulatory Visit (HOSPITAL_COMMUNITY): Payer: Self-pay

## 2022-10-02 NOTE — Telephone Encounter (Signed)
Confirmed with the patient she is lightly spotting, but no heavy bleeding, which is normal for her. Informed her she may proceed with LAAO tomorrow. She was grateful for follow-up.

## 2022-10-03 ENCOUNTER — Inpatient Hospital Stay (HOSPITAL_COMMUNITY): Payer: Medicare Other

## 2022-10-03 ENCOUNTER — Other Ambulatory Visit: Payer: Self-pay

## 2022-10-03 ENCOUNTER — Encounter (HOSPITAL_COMMUNITY)
Admission: RE | Disposition: A | Discharge status: Home / Self Care | Payer: Self-pay | Source: Home / Self Care | Attending: Cardiology

## 2022-10-03 ENCOUNTER — Inpatient Hospital Stay (HOSPITAL_COMMUNITY): Payer: Medicare Other | Admitting: Certified Registered"

## 2022-10-03 ENCOUNTER — Other Ambulatory Visit: Payer: Self-pay | Admitting: Cardiology

## 2022-10-03 ENCOUNTER — Inpatient Hospital Stay (HOSPITAL_COMMUNITY)
Admission: RE | Admit: 2022-10-03 | Discharge: 2022-10-03 | DRG: 274 | Disposition: A | Discharge status: Home / Self Care | Payer: Medicare Other | Attending: Cardiology | Admitting: Cardiology

## 2022-10-03 ENCOUNTER — Encounter (HOSPITAL_COMMUNITY): Payer: Self-pay | Admitting: Cardiology

## 2022-10-03 DIAGNOSIS — Z8619 Personal history of other infectious and parasitic diseases: Secondary | ICD-10-CM | POA: Diagnosis not present

## 2022-10-03 DIAGNOSIS — Z823 Family history of stroke: Secondary | ICD-10-CM | POA: Diagnosis not present

## 2022-10-03 DIAGNOSIS — M069 Rheumatoid arthritis, unspecified: Secondary | ICD-10-CM | POA: Diagnosis present

## 2022-10-03 DIAGNOSIS — I4819 Other persistent atrial fibrillation: Secondary | ICD-10-CM | POA: Diagnosis not present

## 2022-10-03 DIAGNOSIS — M1990 Unspecified osteoarthritis, unspecified site: Secondary | ICD-10-CM | POA: Diagnosis not present

## 2022-10-03 DIAGNOSIS — Z86010 Personal history of colonic polyps: Secondary | ICD-10-CM | POA: Diagnosis not present

## 2022-10-03 DIAGNOSIS — Z7901 Long term (current) use of anticoagulants: Secondary | ICD-10-CM | POA: Diagnosis not present

## 2022-10-03 DIAGNOSIS — Z006 Encounter for examination for normal comparison and control in clinical research program: Secondary | ICD-10-CM | POA: Diagnosis not present

## 2022-10-03 DIAGNOSIS — Z981 Arthrodesis status: Secondary | ICD-10-CM

## 2022-10-03 DIAGNOSIS — Z6841 Body Mass Index (BMI) 40.0 and over, adult: Secondary | ICD-10-CM | POA: Diagnosis not present

## 2022-10-03 DIAGNOSIS — K219 Gastro-esophageal reflux disease without esophagitis: Secondary | ICD-10-CM | POA: Diagnosis present

## 2022-10-03 DIAGNOSIS — I739 Peripheral vascular disease, unspecified: Secondary | ICD-10-CM

## 2022-10-03 DIAGNOSIS — M810 Age-related osteoporosis without current pathological fracture: Secondary | ICD-10-CM | POA: Diagnosis present

## 2022-10-03 DIAGNOSIS — Z8249 Family history of ischemic heart disease and other diseases of the circulatory system: Secondary | ICD-10-CM | POA: Diagnosis not present

## 2022-10-03 DIAGNOSIS — Z79899 Other long term (current) drug therapy: Secondary | ICD-10-CM

## 2022-10-03 DIAGNOSIS — I3139 Other pericardial effusion (noninflammatory): Secondary | ICD-10-CM | POA: Diagnosis not present

## 2022-10-03 DIAGNOSIS — Z888 Allergy status to other drugs, medicaments and biological substances status: Secondary | ICD-10-CM | POA: Diagnosis not present

## 2022-10-03 DIAGNOSIS — Z95818 Presence of other cardiac implants and grafts: Secondary | ICD-10-CM

## 2022-10-03 DIAGNOSIS — J309 Allergic rhinitis, unspecified: Secondary | ICD-10-CM | POA: Diagnosis present

## 2022-10-03 DIAGNOSIS — I5032 Chronic diastolic (congestive) heart failure: Secondary | ICD-10-CM | POA: Diagnosis not present

## 2022-10-03 DIAGNOSIS — Z01818 Encounter for other preprocedural examination: Secondary | ICD-10-CM | POA: Diagnosis not present

## 2022-10-03 DIAGNOSIS — Z885 Allergy status to narcotic agent status: Secondary | ICD-10-CM

## 2022-10-03 DIAGNOSIS — I4891 Unspecified atrial fibrillation: Secondary | ICD-10-CM | POA: Diagnosis not present

## 2022-10-03 DIAGNOSIS — I10 Essential (primary) hypertension: Secondary | ICD-10-CM

## 2022-10-03 DIAGNOSIS — I083 Combined rheumatic disorders of mitral, aortic and tricuspid valves: Secondary | ICD-10-CM | POA: Diagnosis not present

## 2022-10-03 DIAGNOSIS — Z87891 Personal history of nicotine dependence: Secondary | ICD-10-CM

## 2022-10-03 DIAGNOSIS — E785 Hyperlipidemia, unspecified: Secondary | ICD-10-CM | POA: Diagnosis present

## 2022-10-03 DIAGNOSIS — I482 Chronic atrial fibrillation, unspecified: Secondary | ICD-10-CM

## 2022-10-03 HISTORY — PX: OTHER SURGICAL HISTORY: SHX169

## 2022-10-03 HISTORY — PX: LEFT ATRIAL APPENDAGE OCCLUSION: EP1229

## 2022-10-03 HISTORY — PX: TEE WITHOUT CARDIOVERSION: SHX5443

## 2022-10-03 HISTORY — DX: Presence of other cardiac implants and grafts: Z95.818

## 2022-10-03 HISTORY — DX: Pneumonia, unspecified organism: J18.9

## 2022-10-03 LAB — POCT ACTIVATED CLOTTING TIME: Activated Clotting Time: 342 seconds

## 2022-10-03 LAB — TYPE AND SCREEN
ABO/RH(D): A POS
Antibody Screen: NEGATIVE

## 2022-10-03 LAB — ECHO TEE

## 2022-10-03 SURGERY — LEFT ATRIAL APPENDAGE OCCLUSION
Anesthesia: General

## 2022-10-03 MED ORDER — SODIUM CHLORIDE 0.9% FLUSH: 3.0000 mL | Freq: Two times a day (BID) | INTRAVENOUS | Status: DC

## 2022-10-03 MED ORDER — CHLORHEXIDINE GLUCONATE 0.12 % MT SOLN
OROMUCOSAL | Status: AC
  Administered 2022-10-03: 15 mL via OROMUCOSAL
  Filled 2022-10-03: qty 15

## 2022-10-03 MED ORDER — ACETAMINOPHEN 325 MG PO TABS
ORAL_TABLET | ORAL | Status: AC
  Filled 2022-10-03: qty 2

## 2022-10-03 MED ORDER — IOHEXOL 350 MG/ML SOLN
INTRAVENOUS | Status: DC | PRN
  Administered 2022-10-03: 20 mL

## 2022-10-03 MED ORDER — HEPARIN SODIUM (PORCINE) 1000 UNIT/ML IJ SOLN
INTRAMUSCULAR | Status: AC
  Filled 2022-10-03: qty 1

## 2022-10-03 MED ORDER — PROPOFOL 10 MG/ML IV BOLUS
INTRAVENOUS | Status: DC | PRN
  Administered 2022-10-03: 110 mg via INTRAVENOUS
  Administered 2022-10-03 (×2): 20 mg via INTRAVENOUS

## 2022-10-03 MED ORDER — CHLORHEXIDINE GLUCONATE 4 % EX LIQD
Freq: Once | CUTANEOUS | Status: DC
  Filled 2022-10-03: qty 15

## 2022-10-03 MED ORDER — FENTANYL CITRATE (PF) 250 MCG/5ML IJ SOLN
INTRAMUSCULAR | Status: DC | PRN
  Administered 2022-10-03: 100 ug via INTRAVENOUS

## 2022-10-03 MED ORDER — SODIUM CHLORIDE 0.9 % IV SOLN: INTRAVENOUS | Status: DC

## 2022-10-03 MED ORDER — HEPARIN (PORCINE) IN NACL 2000-0.9 UNIT/L-% IV SOLN
INTRAVENOUS | Status: DC | PRN
  Administered 2022-10-03: 1000 mL

## 2022-10-03 MED ORDER — ONDANSETRON HCL 4 MG/2ML IJ SOLN
INTRAMUSCULAR | Status: DC | PRN
  Administered 2022-10-03: 4 mg via INTRAVENOUS

## 2022-10-03 MED ORDER — PROTAMINE SULFATE 10 MG/ML IV SOLN
INTRAVENOUS | Status: DC | PRN
  Administered 2022-10-03: 35 mg via INTRAVENOUS

## 2022-10-03 MED ORDER — ROCURONIUM BROMIDE 10 MG/ML (PF) SYRINGE
PREFILLED_SYRINGE | INTRAVENOUS | Status: DC | PRN
  Administered 2022-10-03: 90 mg via INTRAVENOUS

## 2022-10-03 MED ORDER — PHENYLEPHRINE HCL-NACL 20-0.9 MG/250ML-% IV SOLN
INTRAVENOUS | Status: DC | PRN
  Administered 2022-10-03: 50 ug/min via INTRAVENOUS

## 2022-10-03 MED ORDER — DEXAMETHASONE SODIUM PHOSPHATE 10 MG/ML IJ SOLN
INTRAMUSCULAR | Status: DC | PRN
  Administered 2022-10-03: 10 mg via INTRAVENOUS

## 2022-10-03 MED ORDER — SODIUM CHLORIDE 0.9 % IV SOLN: 250.0000 mL | INTRAVENOUS | Status: DC | PRN

## 2022-10-03 MED ORDER — LIDOCAINE 2% (20 MG/ML) 5 ML SYRINGE
INTRAMUSCULAR | Status: DC | PRN
  Administered 2022-10-03: 60 mg via INTRAVENOUS

## 2022-10-03 MED ORDER — CHLORHEXIDINE GLUCONATE 0.12 % MT SOLN
15.0000 mL | Freq: Once | OROMUCOSAL | Status: AC
  Filled 2022-10-03: qty 15

## 2022-10-03 MED ORDER — LACTATED RINGERS IV SOLN: INTRAVENOUS | Status: DC

## 2022-10-03 MED ORDER — PROPOFOL 500 MG/50ML IV EMUL
INTRAVENOUS | Status: DC | PRN
  Administered 2022-10-03: 250 ug/kg/min via INTRAVENOUS

## 2022-10-03 MED ORDER — ACETAMINOPHEN 325 MG PO TABS
650.0000 mg | ORAL_TABLET | ORAL | Status: DC | PRN
  Administered 2022-10-03: 650 mg via ORAL

## 2022-10-03 MED ORDER — CEFAZOLIN SODIUM-DEXTROSE 2-4 GM/100ML-% IV SOLN
2.0000 g | INTRAVENOUS | Status: AC
  Administered 2022-10-03: 2 g via INTRAVENOUS
  Filled 2022-10-03: qty 100

## 2022-10-03 MED ORDER — SUGAMMADEX SODIUM 200 MG/2ML IV SOLN
INTRAVENOUS | Status: DC | PRN
  Administered 2022-10-03: 200 mg via INTRAVENOUS

## 2022-10-03 MED ORDER — SODIUM CHLORIDE 0.9% FLUSH: 3.0000 mL | INTRAVENOUS | Status: DC | PRN

## 2022-10-03 MED ORDER — MIDAZOLAM HCL 2 MG/2ML IJ SOLN
INTRAMUSCULAR | Status: DC | PRN
  Administered 2022-10-03: 2 mg via INTRAVENOUS

## 2022-10-03 MED ORDER — HEPARIN SODIUM (PORCINE) 1000 UNIT/ML IJ SOLN
INTRAMUSCULAR | Status: DC | PRN
  Administered 2022-10-03: 18000 [IU] via INTRAVENOUS

## 2022-10-03 MED ORDER — HEPARIN (PORCINE) IN NACL 1000-0.9 UT/500ML-% IV SOLN
INTRAVENOUS | Status: DC | PRN
  Administered 2022-10-03: 500 mL

## 2022-10-03 SURGICAL SUPPLY — 19 items
CATH INFINITI 5FR ANG PIGTAIL (CATHETERS) IMPLANT
CLOSURE PERCLOSE PROSTYLE (VASCULAR PRODUCTS) IMPLANT
DEVICE WATCHMAN FLX PROC (KITS) IMPLANT
DILATOR VESSEL 10FR 20CM (INTRODUCER) IMPLANT
KIT HEART LEFT (KITS) ×1 IMPLANT
KIT SHEA VERSACROSS LAAC CONNE (KITS) IMPLANT
MAT PREVALON FULL STRYKER (MISCELLANEOUS) IMPLANT
PACK CARDIAC CATHETERIZATION (CUSTOM PROCEDURE TRAY) ×1 IMPLANT
PAD DEFIB RADIO PHYSIO CONN (PAD) ×1 IMPLANT
SHEATH PERFORMER 16FR 30 (SHEATH) IMPLANT
SHEATH PINNACLE 8F 10CM (SHEATH) IMPLANT
SHEATH PROBE COVER 6X72 (BAG) IMPLANT
SYS WATCHMAN FXD DBL (SHEATH) ×1
SYSTEM WATCHMAN FXD DBL (SHEATH) IMPLANT
TRANSDUCER W/STOPCOCK (MISCELLANEOUS) ×1 IMPLANT
TUBING CIL FLEX 10 FLL-RA (TUBING) ×1 IMPLANT
WATCHMAN FLX 31 (Prosthesis & Implant Heart) IMPLANT
WATCHMAN FLX PROCEDURE DEVICE (KITS) ×1 IMPLANT
WATCHMAN PROCED TRUSEAL ACCESS (SHEATH) IMPLANT

## 2022-10-03 NOTE — Progress Notes (Addendum)
Pt arrived to 4E from cath lab.  Groin site assessed and WNL.  CCMD notified with second verifyer, CHG bath complete.  Pt oriented to room, call bell, and educated on how to order lunch.  Family at bedside.  Pt and family educated on bedrest protocol until 1300.

## 2022-10-03 NOTE — Progress Notes (Signed)
Bedrest complete.  Groin site level 0.    Pt ambulated in hall approximately 150' limited only by neuropathic foot pain.  Otherwise tolerated well.  Used front wheel walker, which she states she has one at home to use as needed. Back to recliner in room.  Call bell in reach and family at bedside.

## 2022-10-03 NOTE — Discharge Summary (Addendum)
[facility]    Patient ID: [name],  MRN: [id number], DOB/AGE: May 22, 1951 72 y.o.  Admit date: 10/03/2022 Discharge date: 10/03/2022  Primary Care Physician: Christine Conners, MD  Primary Cardiologist: None  Electrophysiologist: Christine Epley, MD  Primary Discharge Diagnosis:  Persistent Atrial Fibrillation Poor candidacy for long term anticoagulation due to  need to use NSAIDS for pain control.   Procedures This Admission:  Transeptal Puncture Intra-procedural TEE which showed no LAA thrombus Left atrial appendage occlusive device placement on 10/03/22 by Dr. Quentin [name].   This study demonstrated:  CONCLUSIONS:  1.Successful implantation of a WATCHMAN left atrial appendage occlusive device    2. TEE demonstrating no LAA thrombus 3. No early apparent complications.    Post Implant Anticoagulation Strategy: Continue Eliquis 63m PO BID x 45 days then transition to Plavix 79mPO daily to complete 6 months of post implant therapy. Plan for CT scan 60 days post implant to assess Watchman position.  Brief HPI: [name] a [age].o. female with a history of persistent atrial fibrillation s/p AF ablation 04/16/22 with Dr. LaQuentin Orend chronic arthritis. She was seen by the [facility] 05/2022 and was referred to Dr. LaQuentin Oreor ablation. Given her joint pain with arthritis, she was interested in pursuing LAAO closure to allow for NSAIDS without the increased risk of bleeding associated with concomitant anticoagulant use.   Pre ablation CT reviewed which showed anatomy suitable for Watchman implant.   Hospital Course:  The patient was admitted and underwent left atrial appendage occlusive device placement with 3177matchman FLX device. Groin site was without complication on the day of discharge. Due to her stability, she will be considered for same day discharge today, 10/03/22. Wound care and restrictions were reviewed with the patient. The patient has  been scheduled for post procedure follow up with Christine DrownP in approximately 1 month. Medication plan will be to continue Eliquis 5mg67m BID x 45 days then transition to Plavix 75mg23mdaily to complete 6 months of post implant therapy. Plan for CT scan 60 days post implant to assess Watchman position. Will require dental SBE for 6 months.     Physical Exam: Vitals:   10/03/22 1030 10/03/22 1100 10/03/22 1130 10/03/22 1200  BP: (!) 157/72 (!) 157/74 (!) 152/71 (!) 150/76  Pulse: 82 78 84 84  Resp: 16 16 17 15  $ Temp:      TempSrc:      SpO2: 92% 92% 92% (!) 89%  Weight:      Height:       General: Well developed, well nourished, NAD Lungs:Clear to ausculation bilaterally. No wheezes, rales, or rhonchi. Breathing is unlabored. Cardiovascular: RRR with S1 S2. No murmurs, rubs, gallops, or LV heave appreciated. Extremities: No edema. Groin site stable with no hematoma.  Neuro: Alert and oriented. No focal deficits. No facial asymmetry. MAE spontaneously. Psych: Responds to questions appropriately with normal affect.    Labs:   Lab Results  Component Value Date   WBC 8.1 09/16/2022   HGB 14.1 09/16/2022   HCT 41.7 09/16/2022   MCV 87 09/16/2022   PLT 229 09/16/2022   No results for input(s): "NA", "K", "CL", "CO2", "BUN", "CREATININE", "CALCIUM", "PROT", "BILITOT", "ALKPHOS", "ALT", "AST", "GLUCOSE" in the last 168 hours.  Invalid input(s): "LABALBU"   Discharge Medications:  Allergies as of 10/03/2022       Reactions   Amlodipine Besylate Other (See Comments)  Tremors   Divalproex Sodium Swelling, Other (See Comments)   Nortriptyline Nausea And Vomiting, Other (See Comments)   tremors   Hydromorphone Hcl Other (See Comments)   Headache, muscle tightness   Other    Band-aid (skin redness)   Rosuvastatin Other (See Comments)   Muscle aches, pains. Resolved with stopping medication. Muscle aches / pains   Tramadol Other (See Comments)   Felt "stoned"         Medication List     TAKE these medications    acetaminophen 650 MG CR tablet Commonly known as: TYLENOL Take 1,300 mg by mouth every 8 (eight) hours as needed for pain.   ascorbic acid 500 MG tablet Commonly known as: VITAMIN C Take 500 mg by mouth in the morning.   Biofreeze 4 % Gel Generic drug: Menthol (Topical Analgesic) Apply 1 application  topically at bedtime. Neuropathy feet/ankles   BIOTIN PO Take 1 tablet by mouth at bedtime.   diclofenac Sodium 1 % Gel Commonly known as: VOLTAREN Apply to affected area 2 times a day   Eliquis 5 MG Tabs tablet Generic drug: apixaban Take 1 tablet by mouth 2  times daily. Notes to patient: Restart Eliquis today, 10/03/22   fluticasone 50 MCG/ACT nasal spray Commonly known as: FLONASE Place 1-2 sprays into both nostrils daily as needed for allergies or rhinitis.   furosemide 20 MG tablet Commonly known as: LASIX Take 2 - 3 tablets by mouth 2 times daily as needed for edema. (need appt for refills) What changed: when to take this   gabapentin 100 MG capsule Commonly known as: NEURONTIN TAKE 3 CAPSULES IN THE MORNING AND 2 CAPSULES AT BEDTIME. OK TO TAKE EXTRA CAPSULE AT BEDTIME AS NEEDED   hydroxychloroquine 200 MG tablet Commonly known as: PLAQUENIL Take 1 tablet (200 mg total) by mouth 2 (two) times daily with food or milk   hydroxypropyl methylcellulose / hypromellose 2.5 % ophthalmic solution Commonly known as: ISOPTO TEARS / GONIOVISC Place 1-2 drops into both eyes in the morning.   losartan 100 MG tablet Commonly known as: COZAAR TAKE 1 TABLET BY MOUTH ONCE A DAY   metoprolol succinate 50 MG 24 hr tablet Commonly known as: TOPROL-XL Take 1 tablet (50 mg total) by mouth 2 (two) times daily.   metoprolol tartrate 25 MG tablet Commonly known as: LOPRESSOR Take 1 tablet  by mouth 3 times daily as needed (for heart rate greater than 110 bpm).   multivitamin with minerals tablet Take 1 tablet by mouth in the  morning and at bedtime.   potassium chloride SA 20 MEQ tablet Commonly known as: KLOR-CON M Take 1 tablet by mouth 2 times daily.   sulfaSALAzine 500 MG tablet Commonly known as: AZULFIDINE Take 2 tablets by mouth twice a day.   Vitamin D 125 MCG (5000 UT) Caps Take 5,000 Units by mouth in the morning.        Disposition:  Home  Discharge Instructions     Call MD for:  difficulty breathing, headache or visual disturbances   Complete by: As directed    Call MD for:  extreme fatigue   Complete by: As directed    Call MD for:  hives   Complete by: As directed    Call MD for:  persistant dizziness or light-headedness   Complete by: As directed    Call MD for:  persistant nausea and vomiting   Complete by: As directed    Call MD for:  redness, tenderness,  or signs of infection (pain, swelling, redness, odor or green/yellow discharge around incision site)   Complete by: As directed    Call MD for:  severe uncontrolled pain   Complete by: As directed    Call MD for:  temperature >100.4   Complete by: As directed    Diet - low sodium heart healthy   Complete by: As directed    Diet - low sodium heart healthy   Complete by: As directed    Discharge instructions   Complete by: As directed    [facility] Procedure, Care After  Procedure MD: Dr. Benson Norway Clinical Coordinator: Lenice Llamas, RN  This sheet gives you information about how to care for yourself after your procedure. Your health care provider may also give you more specific instructions. If you have problems or questions, contact your health care provider.  What can I expect after the procedure? After the procedure, it is common to have: Bruising around your puncture site. Tenderness around your puncture site. Tiredness (fatigue).  Medication instructions It is very important to continue to take your blood thinner as directed by your doctor after the Watchman procedure. Call your procedure doctor's office  with question or concerns. If you are on Coumadin (warfarin), you will have your INR checked the week after your procedure, with a goal INR of 2.0 - 3.0. Please follow your medication instructions on your discharge summary. Only take the medications listed on your discharge paperwork.  Follow up You will be seen in 1 month after your procedure You will have a repeat CT scan approximately 8 weeks after your procedure mark to check your device You will follow up the MD/APP who performed your procedure 6 months after your procedure The Watchman Clinical Coordinator will check in with you from time to time, including 1 and 2 years after your procedure.    Follow these instructions at home: Puncture site care  Follow instructions from your health care provider about how to take care of your puncture site. Make sure you: If present, leave stitches (sutures), skin glue, or adhesive strips in place.  If a large square bandage is present, this may be removed 24 hours after surgery.  Check your puncture site every day for signs of infection. Check for: Redness, swelling, or pain. Fluid or blood. If your puncture site starts to bleed, lie down on your back, apply firm pressure to the area, and contact your health care provider. Warmth. Pus or a bad smell. Driving Do not drive yourself home if you received sedation Do not drive for at least 4 days after your procedure or however long your health care provider recommends. (Do not resume driving if you have previously been instructed not to drive for other health reasons.) Do not spend greater than 1 hour at a time in a car for the first 3 days. Stop and take a break with a 5 minute walk at least every hour.  Do not drive or use heavy machinery while taking prescription pain medicine.  Activity Avoid activities that take a lot of effort, including exercise, for at least 7 days after your procedure. For the first 3 days, avoid sitting for longer than  one hour at a time.  Avoid alcoholic beverages, signing paperwork, or participating in legal proceedings for 24 hours after receiving sedation Do not lift anything that is heavier than 10 lb (4.5 kg) for one week.  No sexual activity for 1 week.  Return to your normal activities as  told by your health care provider. Ask your health care provider what activities are safe for you. General instructions Take over-the-counter and prescription medicines only as told by your health care provider. Do not use any products that contain nicotine or tobacco, such as cigarettes and e-cigarettes. If you need help quitting, ask your health care provider. You may shower after 24 hours, but Do not take baths, swim, or use a hot tub for 1 week.  Do not drink alcohol for 24 hours after your procedure. Keep all follow-up visits as told by your health care provider. This is important. Dental Work: You will require antibiotics prior to any dental work, including cleanings, for 6 months after your Watchman implantation to help protect you from infection. After 6 months, antibiotics are no longer required. Contact a health care provider if: You have redness, mild swelling, or pain around your puncture site. You have soreness in your throat or at your puncture site that does not improve after several days You have fluid or blood coming from your puncture site that stops after applying firm pressure to the area. Your puncture site feels warm to the touch. You have pus or a bad smell coming from your puncture site. You have a fever. You have chest pain or discomfort that spreads to your neck, jaw, or arm. You are sweating a lot. You feel nauseous. You have a fast or irregular heartbeat. You have shortness of breath. You are dizzy or light-headed and feel the need to lie down. You have pain or numbness in the arm or leg closest to your puncture site. Get help right away if: Your puncture site suddenly swells. Your  puncture site is bleeding and the bleeding does not stop after applying firm pressure to the area. These symptoms may represent a serious problem that is an emergency. Do not wait to see if the symptoms will go away. Get medical help right away. Call your local emergency services (911 in the [place] Do not drive yourself to the hospital. Summary After the procedure, it is normal to have bruising and tenderness at the puncture site in your groin, neck, or forearm. Check your puncture site every day for signs of infection. Get help right away if your puncture site is bleeding and the bleeding does not stop after applying firm pressure to the area. This is a medical emergency.  This information is not intended to replace advice given to you by your health care provider. Make sure you discuss any questions you have with your health care provider.   Discharge instructions   Complete by: As directed    [facility] Procedure, Care After  Procedure MD: Dr. Benson Norway Clinical Coordinator: Lenice Llamas, RN  This sheet gives you information about how to care for yourself after your procedure. Your health care provider may also give you more specific instructions. If you have problems or questions, contact your health care provider.  What can I expect after the procedure? After the procedure, it is common to have: Bruising around your puncture site. Tenderness around your puncture site. Tiredness (fatigue).  Medication instructions It is very important to continue to take your blood thinner as directed by your doctor after the Watchman procedure. Call your procedure doctor's office with question or concerns. If you are on Coumadin (warfarin), you will have your INR checked the week after your procedure, with a goal INR of 2.0 - 3.0. Please follow your medication instructions on your discharge summary. Only take the medications  listed on your discharge paperwork.  Follow up You will be seen in 1  month after your procedure You will have a repeat CT scan approximately 8 weeks after your procedure mark to check your device You will follow up the MD/APP who performed your procedure 6 months after your procedure The Watchman Clinical Coordinator will check in with you from time to time, including 1 and 2 years after your procedure.    Follow these instructions at home: Puncture site care  Follow instructions from your health care provider about how to take care of your puncture site. Make sure you: If present, leave stitches (sutures), skin glue, or adhesive strips in place.  If a large square bandage is present, this may be removed 24 hours after surgery.  Check your puncture site every day for signs of infection. Check for: Redness, swelling, or pain. Fluid or blood. If your puncture site starts to bleed, lie down on your back, apply firm pressure to the area, and contact your health care provider. Warmth. Pus or a bad smell. Driving Do not drive yourself home if you received sedation Do not drive for at least 4 days after your procedure or however long your health care provider recommends. (Do not resume driving if you have previously been instructed not to drive for other health reasons.) Do not spend greater than 1 hour at a time in a car for the first 3 days. Stop and take a break with a 5 minute walk at least every hour.  Do not drive or use heavy machinery while taking prescription pain medicine.  Activity Avoid activities that take a lot of effort, including exercise, for at least 7 days after your procedure. For the first 3 days, avoid sitting for longer than one hour at a time.  Avoid alcoholic beverages, signing paperwork, or participating in legal proceedings for 24 hours after receiving sedation Do not lift anything that is heavier than 10 lb (4.5 kg) for one week.  No sexual activity for 1 week.  Return to your normal activities as told by your health care provider. Ask  your health care provider what activities are safe for you. General instructions Take over-the-counter and prescription medicines only as told by your health care provider. Do not use any products that contain nicotine or tobacco, such as cigarettes and e-cigarettes. If you need help quitting, ask your health care provider. You may shower after 24 hours, but Do not take baths, swim, or use a hot tub for 1 week.  Do not drink alcohol for 24 hours after your procedure. Keep all follow-up visits as told by your health care provider. This is important. Dental Work: You will require antibiotics prior to any dental work, including cleanings, for 6 months after your Watchman implantation to help protect you from infection. After 6 months, antibiotics are no longer required. Contact a health care provider if: You have redness, mild swelling, or pain around your puncture site. You have soreness in your throat or at your puncture site that does not improve after several days You have fluid or blood coming from your puncture site that stops after applying firm pressure to the area. Your puncture site feels warm to the touch. You have pus or a bad smell coming from your puncture site. You have a fever. You have chest pain or discomfort that spreads to your neck, jaw, or arm. You are sweating a lot. You feel nauseous. You have a fast or irregular heartbeat. You have  shortness of breath. You are dizzy or light-headed and feel the need to lie down. You have pain or numbness in the arm or leg closest to your puncture site. Get help right away if: Your puncture site suddenly swells. Your puncture site is bleeding and the bleeding does not stop after applying firm pressure to the area. These symptoms may represent a serious problem that is an emergency. Do not wait to see if the symptoms will go away. Get medical help right away. Call your local emergency services (911 in the [place] Do not drive yourself to  the hospital. Summary After the procedure, it is normal to have bruising and tenderness at the puncture site in your groin, neck, or forearm. Check your puncture site every day for signs of infection. Get help right away if your puncture site is bleeding and the bleeding does not stop after applying firm pressure to the area. This is a medical emergency.  This information is not intended to replace advice given to you by your health care provider. Make sure you discuss any questions you have with your health care provider.   Increase activity slowly   Complete by: As directed    Increase activity slowly   Complete by: As directed        Follow-up Information     Tommie Raymond, NP Follow up on 11/04/2022.   Specialty: Cardiology Why: @ 945am. Please arrive at 930am. Contact information: [address] [place] [address] [phone]                Duration of Discharge Encounter: Greater than 30 minutes including physician time.  Signed, Christine Drown, NP  10/03/2022 4:16 PM

## 2022-10-03 NOTE — Anesthesia Procedure Notes (Signed)
Procedure Name: Intubation Date/Time: 10/03/2022 8:13 AM  Performed by: Lance Coon, CRNAPre-anesthesia Checklist: Patient identified, Emergency Drugs available, Suction available, Patient being monitored and Timeout performed Patient Re-evaluated:Patient Re-evaluated prior to induction Oxygen Delivery Method: Circle system utilized Preoxygenation: Pre-oxygenation with 100% oxygen Induction Type: IV induction Ventilation: Mask ventilation without difficulty Laryngoscope Size: Miller and 3 Grade View: Grade I Tube type: Oral Tube size: 7.0 mm Number of attempts: 1 Airway Equipment and Method: Stylet Placement Confirmation: ETT inserted through vocal cords under direct vision, positive ETCO2 and breath sounds checked- equal and bilateral Secured at: 21 cm Tube secured with: Tape Dental Injury: Teeth and Oropharynx as per pre-operative assessment

## 2022-10-03 NOTE — Anesthesia Preprocedure Evaluation (Signed)
Anesthesia Evaluation  Patient identified by MRN, date of birth, ID band Patient awake    Reviewed: Allergy & Precautions, NPO status , Patient's Chart, lab work & pertinent test results, reviewed documented beta blocker date and time   History of Anesthesia Complications (+) PONV and history of anesthetic complications  Airway Mallampati: IV  TM Distance: >3 FB Neck ROM: Full    Dental  (+) Missing, Dental Advisory Given,    Pulmonary neg shortness of breath, neg sleep apnea, neg COPD, neg recent URI, former smoker   breath sounds clear to auscultation       Cardiovascular hypertension, Pt. on medications and Pt. on home beta blockers + Peripheral Vascular Disease  + dysrhythmias Atrial Fibrillation  Rhythm:Regular   1. Left ventricular ejection fraction, by estimation, is 55 to 60%. The  left ventricle has normal function. The left ventricle has no regional  wall motion abnormalities. The left ventricular internal cavity size was  mildly dilated. There is mild left  ventricular hypertrophy. Left ventricular diastolic function could not be  evaluated.   2. Right ventricular systolic function is mildly reduced. The right  ventricular size is mildly enlarged. There is mildly elevated pulmonary  artery systolic pressure.   3. The mitral valve is normal in structure. Mild mitral valve  regurgitation. No evidence of mitral stenosis.   4. The aortic valve is tricuspid. Aortic valve regurgitation is trivial.  Aortic valve sclerosis is present, with no evidence of aortic valve  stenosis.   5. Aortic dilatation noted. There is mild dilatation of the aortic root,  measuring 41 mm. There is mild dilatation of the ascending aorta,  measuring 41 mm.   6. The inferior vena cava is dilated in size with >50% respiratory  variability, suggesting right atrial pressure of 8 mmHg.      Neuro/Psych  Neuromuscular disease  negative psych ROS    GI/Hepatic Neg liver ROS,GERD  ,,  Endo/Other    Morbid obesity  Renal/GU Lab Results      Component                Value               Date                      CREATININE               1.08 (H)            09/16/2022            Lab Results      Component                Value               Date                      K                        4.2                 09/16/2022                Musculoskeletal  (+) Arthritis ,    Abdominal   Peds  Hematology Lab Results      Component                Value  Date                      WBC                      8.1                 09/16/2022                HGB                      14.1                09/16/2022                HCT                      41.7                09/16/2022                MCV                      87                  09/16/2022                PLT                      229                 09/16/2022            eliquis   Anesthesia Other Findings   Reproductive/Obstetrics                              Anesthesia Physical Anesthesia Plan  ASA: 3  Anesthesia Plan: General   Post-op Pain Management: Minimal or no pain anticipated   Induction: Intravenous  PONV Risk Score and Plan: 4 or greater and Ondansetron, Dexamethasone, Propofol infusion and TIVA  Airway Management Planned: Oral ETT  Additional Equipment: ClearSight  Intra-op Plan:   Post-operative Plan: Extubation in OR  Informed Consent: I have reviewed the patients History and Physical, chart, labs and discussed the procedure including the risks, benefits and alternatives for the proposed anesthesia with the patient or authorized representative who has indicated his/her understanding and acceptance.     Dental advisory given  Plan Discussed with: CRNA  Anesthesia Plan Comments:          Anesthesia Quick Evaluation

## 2022-10-03 NOTE — H&P (Signed)
Electrophysiology Office Follow up Visit Note:     Date:  10/03/2022    ID:  [name], [name] April 16, 1951, MRN [id number]   PCP:  Farrel Conners, MD           [facility] Cardiologist:  None  [facility] Electrophysiologist:  Vickie Epley, MD      Interval History:     [name] is a 72 y.o. female who presents for a follow up visit.  She underwent a successful A-fib ablation on April 16, 2022.  She saw Butch Penny in [place] clinic on May 30, 2022.  At that appointment she was doing well without symptomatic recurrence.  Since the appointment with Butch Penny she tells me she has done well without recurrence of atrial fibrillation.  She continues to take Eliquis for stroke prophylaxis but desires a long-term stroke risk mitigation strategy avoiding long-term exposure to anticoagulation.  She asks about watchman implant today.  She has chronic arthritis and would like to take NSAIDs without the increased risk of bleeding associated with concomitant anticoagulant use.    Presents for LAAO today.      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   Osteopenia 10/17/2011    DEXA 09/2007: -1.4 L fem; 10/2011: -1.2 L fem    Osteoporosis     PONV (postoperative nausea and vomiting)     Pseudogout of foot     Rheumatoid arthritis(714.0) dx 2010   Shingles      hx of    Tibial plateau fracture, right, closed, initial encounter 07/16/2017           Past Surgical  History:  Procedure Laterality Date   ATRIAL FIBRILLATION ABLATION N/A 04/16/2022    Procedure: ATRIAL FIBRILLATION ABLATION;  Surgeon: Vickie Epley, MD;  Location: [facility];  Service: Cardiovascular;  Laterality: N/A;   BREAST BIOPSY   1972   Broken wrist   2010   CARDIOVERSION N/A 11/22/2021    Procedure: CARDIOVERSION;  Surgeon: Geralynn Rile, MD;  Location: [facility];  Service: Cardiovascular;  Laterality: N/A;   CATARACT EXTRACTION   03/2012    left   COLONOSCOPY       COSMETIC SURGERY       FRACTURE SURGERY       INNER EAR SURGERY        busted ear drum   MAXIMUM ACCESS (MAS)POSTERIOR LUMBAR INTERBODY FUSION (PLIF) 1 LEVEL N/A 10/11/2016    Procedure: Lumbar one-Sacral one Maximum access posterior lumbar interbody fusion;  Surgeon: Erline Levine, MD;  Location: [facility];  Service: Neurosurgery;  Laterality: N/A;   ORIF WRIST FRACTURE Right 07/18/2017    Procedure: OPEN REDUCTION INTERNAL FIXATION (ORIF) WRIST FRACTURE;  Surgeon: Renette Butters, MD;  Location: [facility];  Service: Orthopedics;  Laterality: Right;   PARATHYROIDECTOMY Left 04/12/2019    Procedure: LEFT SUPERIOR PARATHYROIDECTOMY;  Surgeon: [name], MD;  Location: [facility];  Service: General;  Laterality: Left;   pneumonia   2007      Current Medications: Active Medications      Current Meds  Medication Sig   acetaminophen (TYLENOL) 325 MG tablet Take 650 mg by mouth daily. Arthritis   apixaban (ELIQUIS) 5 MG TABS tablet Take 1 tablet by mouth 2  times daily.   BIOTIN PO Take 1 tablet by mouth daily.   Cholecalciferol (VITAMIN D) 125 MCG (5000 UT) CAPS Take 5,000 Units by mouth daily.   diclofenac sodium (VOLTAREN) 1 % GEL Apply 2 g topically 4 (four) times daily as needed (pain).   fexofenadine (ALLEGRA) 180 MG tablet Take 180 mg by mouth daily as needed (allergies).   furosemide (LASIX) 20 MG tablet Take 2 - 3 tablets by mouth 2 times daily as needed for edema. (need appt for refills)    gabapentin (NEURONTIN) 100 MG capsule TAKE 3 CAPSULES IN THE MORNING AND 2 CAPSULES AT BEDTIME. OK TO TAKE EXTRA CAPSULE AT BEDTIME AS NEEDED   hydroxychloroquine (PLAQUENIL) 200 MG tablet Take 1 tablet by mouth 2 times a day with food or milk   hydroxypropyl methylcellulose / hypromellose (ISOPTO TEARS / GONIOVISC) 2.5 % ophthalmic solution Place 1-2 drops into both eyes daily.   losartan (COZAAR) 100 MG tablet TAKE 1 TABLET BY MOUTH ONCE A DAY   Menthol, Topical Analgesic, (BIOFREEZE) 4 % GEL Apply 1 application. topically daily as needed (pain).   metoprolol succinate (TOPROL-XL) 50 MG 24 hr tablet Take 1 tablet (50 mg total) by mouth 2 (two) times daily.   metoprolol tartrate (LOPRESSOR) 25 MG tablet Take 1 tablet  by mouth 3 times daily as needed (for heart rate greater than 110 bpm).   Multiple Vitamins-Minerals (MULTIVITAMIN WITH MINERALS) tablet Take 2 tablets by mouth daily.   potassium chloride SA (KLOR-CON M) 20 MEQ tablet Take 1 tablet by mouth 2 times daily.   sulfaSALAzine (AZULFIDINE) 500 MG tablet Take 2 tablets by mouth twice a day.   vitamin C (ASCORBIC ACID) 500 MG tablet Take 500 mg by mouth daily.        Allergies:   Amlodipine besylate, Depakote [divalproex sodium], Nortriptyline, Crestor [rosuvastatin], Dilaudid [hydromorphone hcl], and Tramadol    Social History         Socioeconomic History   Marital status: Married      Spouse name: Not on file   Number of children: 3   Years of education: Not on file   Highest education level: 11th grade  Occupational History   Occupation: Retired  Tobacco Use   Smoking status: Former      Packs/day: 4.00      Years: 4.00      Total pack years: 16.00      Types: Cigarettes      Start date: 87      Quit date: 1985      Years since quitting: 38.9   Smokeless tobacco: Never  Vaping Use   Vaping Use: Never used  Substance and Sexual Activity   Alcohol use: No      Alcohol/week: 0.0 standard drinks of alcohol   Drug  use: No   Sexual activity: Not on file  Other Topics Concern   Not on file  Social History Narrative    Artist -retired Building control surveyor    Married, lives with  spouse, [name], he is IT support for [employer]    3 sons    2 caffeinated beverages a day    No regular exercise, diet is ok    Right handed    Social Determinants of Health        Financial Resource Strain: Low Risk  (02/12/2022)    Overall Financial Resource Strain (CARDIA)     Difficulty of Paying Living Expenses: Not hard at all  Food Insecurity: No Food Insecurity (02/12/2022)    Hunger Vital Sign     Worried About Running Out of Food in the Last Year: Never true     Ran Out of Food in the Last Year: Never true  Transportation Needs: No Transportation Needs (02/12/2022)    PRAPARE - Armed forces logistics/support/administrative officer (Medical): No     Lack of Transportation (Non-Medical): No  Physical Activity: Unknown (02/12/2022)    Exercise Vital Sign     Days of Exercise per Week: 0 days     Minutes of Exercise per Session: Not on file  Stress: No Stress Concern Present (02/12/2022)    [place]     Feeling of Stress : Not at all  Social Connections: Unknown (02/12/2022)    Social Connection and Isolation Panel [NHANES]     Frequency of Communication with Friends and Family: More than three times a week     Frequency of Social Gatherings with Friends and Family: More than three times a week     Attends Religious Services: Patient refused     Marine scientist or Organizations: No     Attends Music therapist: Not on file     Marital Status: Married      Family History: The patient's family history includes Heart attack in her father; Hypertension in her brother, father, and sister; Hyperthyroidism in her sister; Hypothyroidism in her brother; Stroke in her mother. There is no history of Colon cancer, Esophageal cancer,  Rectal cancer, or Stomach cancer.   ROS:   Please see the history of present illness.    All other systems reviewed and are negative.   EKGs/Labs/Other Studies Reviewed:     The following studies were reviewed today:     EKG:  The ekg ordered today demonstrates sinus rhythm, PVCs   Recent Labs: 10/10/2021: B Natriuretic Peptide 428.4 10/13/2021: Magnesium 2.1 11/05/2021: ALT 13 02/13/2022: TSH 1.62 03/28/2022: BUN 20; Creatinine, Ser 1.13; Hemoglobin 13.8; Platelets 214; Potassium 3.9; Sodium 141  Recent Lipid Panel Labs (Brief)          Component Value Date/Time    CHOL 160 08/29/2021 1352    CHOL 225 (H) 06/21/2014 1146    TRIG 149.0 08/29/2021 1352    TRIG 169 (H) 06/21/2014 1146    HDL 42.30 08/29/2021 1352    HDL 48 06/21/2014 1146    CHOLHDL 4 08/29/2021 1352    VLDL 29.8 08/29/2021 1352    LDLCALC 88 08/29/2021 1352    LDLCALC 158 (H) 03/31/2020 0845    LDLCALC 143 (H) 06/21/2014 1146    LDLDIRECT 153.0 05/29/2021 1109        Physical Exam:     VS:  BP 150/89   Pulse 81   Ht 5' 6"$  (1.676 m)   Wt 265 lb (120.2 kg)   SpO2 93%   BMI 42.77 kg/m         Wt Readings  from Last 3 Encounters:  07/15/22 265 lb (120.2 kg)  05/30/22 265 lb 12.8 oz (120.6 kg)  04/16/22 262 lb (118.8 kg)      GEN:  Well nourished, well developed in no acute distress.  Obese HEENT: Normal NECK: No JVD; No carotid bruits LYMPHATICS: No lymphadenopathy CARDIAC: RRR, no murmurs, rubs, gallops RESPIRATORY:  Clear to auscultation without rales, wheezing or rhonchi  ABDOMEN: Soft, non-tender, non-distended MUSCULOSKELETAL:  No edema; No deformity  SKIN: Warm and dry NEUROLOGIC:  Alert and oriented x 3 PSYCHIATRIC:  Normal affect            Assessment ASSESSMENT:     1. Persistent atrial fibrillation ([place])   2. Chronic diastolic heart failure (HCC)     PLAN:     In order of problems listed above:   #Persistent atrial fibrillation Doing well after her April 16, 2022  ablation without recurrence On Eliquis for stroke prophylaxis.  She would like to be on the use of NSAIDs more freely given her chronic arthritic pain and inquires about watchman implant during today's visit.  I do think she would be a reasonable candidate for this procedure.  We discussed the procedural details including the risks and likelihood of success and she wishes to proceed.   --------------------   I have seen [name] in the office today who is being considered for a Watchman left atrial appendage closure device. I believe they will benefit from this procedure given their history of atrial fibrillation, CHA2DS2-VASc score of 4 and unadjusted ischemic stroke rate of 4.4% per year. Unfortunately, the patient is not felt to be a long term anticoagulation candidate secondary to chronic NSAID use due to arthritic pain. The patient's chart has been reviewed and I feel that they would be a candidate for short term oral anticoagulation after Watchman implant.    It is my belief that after undergoing a LAA closure procedure, [name] will not need long term anticoagulation which eliminates anticoagulation side effects and major bleeding risk.    Procedural risks for the Watchman implant have been reviewed with the patient including a 0.5% risk of stroke, <1% risk of perforation and <1% risk of device embolization. Other risks include bleeding, vascular damage, tamponade, worsening renal function, and death. The patient understands these risk and wishes to proceed.       The published clinical data on the safety and effectiveness of WATCHMAN include but are not limited to the following: - Holmes DR, Mechele Claude, Sick P et al. for the PROTECT AF Investigators. Percutaneous closure of the left atrial appendage versus warfarin therapy for prevention of stroke in patients with atrial fibrillation: a randomised non-inferiority trial. Lancet 2009; 374: 534-42. Mechele Claude, Doshi SK, Abelardo Diesel D et al. on behalf of the PROTECT AF Investigators. Percutaneous Left Atrial Appendage Closure for Stroke Prophylaxis in Patients With Atrial Fibrillation 2.3-Year Follow-up of the PROTECT AF (Watchman Left Atrial Appendage System for Embolic Protection in Patients With Atrial Fibrillation) Trial. Circulation 2013; 127:720-729. - Alli O, Doshi S,  Kar S, Reddy VY, Sievert H et al. Quality of Life Assessment in the Randomized PROTECT AF (Percutaneous Closure of the Left Atrial Appendage Versus Warfarin Therapy for Prevention of Stroke in Patients With Atrial Fibrillation) Trial of Patients at Risk for Stroke With Nonvalvular Atrial Fibrillation. J Am Coll Cardiol 2013; N8865744. Vertell Limber DR, Tarri Abernethy, Price Jerilynn Mages, Whisenant B, Sievert H, Doshi S, Huber K, Reddy V. Prospective randomized  evaluation of the Watchman left atrial appendage Device in patients with atrial fibrillation versus long-term warfarin therapy; the PREVAIL trial. Journal of the [employer] of Cardiology, Vol. 4, No. 1, 2014, 1-11. - Kar S, Doshi SK, Sadhu A, Horton R, Osorio J et al. Primary outcome evaluation of a next-generation left atrial appendage closure device: results from the PINNACLE FLX trial. Circulation 2021;143(18)1754-1762.      After today's visit with the patient which was dedicated solely for shared decision making visit regarding LAA closure device, the patient decided to proceed with the LAA appendage closure procedure scheduled to be done in the near future at [facility].     HAS-BLED score 3 Hypertension Yes  Abnormal renal and liver function (Dialysis, transplant, Cr >2.26 mg/dL /Cirrhosis or Bilirubin >2x Normal or AST/ALT/AP >3x Normal) No  Stroke No  Bleeding No  Labile INR (Unstable/high INR) No  Elderly (>65) Yes  Drugs or alcohol (? 8 drinks/week, anti-plt or NSAID) Yes    CHA2DS2-VASc Score = 4  The patient's score is based upon: CHF History: 1 HTN History: 1 Diabetes History:  0 Stroke History: 0 Vascular Disease History: 0 Age Score: 1 Gender Score: 1      Presents for LAAO today. Procedure reviewed.       Signed, Lars Mage, MD, [facility], [facility] 10/03/2022 Electrophysiology [facility]

## 2022-10-03 NOTE — Progress Notes (Signed)
  [facility] TEAM  Patient doing well s/p LAAO. She is hemodynamically stable. Groin sites stable. Plan for early ambulation after bedrest completed and hopeful discharge over the next several hours.   Kathyrn Drown NP-C Structural Heart Team  Pager: [phone] Phone: [phone]

## 2022-10-03 NOTE — Transfer of Care (Signed)
Immediate Anesthesia Transfer of Care Note  Patient: [name]  Procedure(s) Performed: LEFT ATRIAL APPENDAGE OCCLUSION TRANSESOPHAGEAL ECHOCARDIOGRAM (TEE)  Patient Location: Cath Lab  Anesthesia Type:General  Level of Consciousness: drowsy and patient cooperative  Airway & Oxygen Therapy: Patient Spontanous Breathing  Post-op Assessment: Report given to RN and Post -op Vital signs reviewed and stable  Post vital signs: Reviewed and stable  Last Vitals:  Vitals Value Taken Time  BP    Temp 36.7 C 10/03/22 0916  Pulse 80 10/03/22 0917  Resp 13 10/03/22 0917  SpO2 93 % 10/03/22 0917  Vitals shown include unvalidated device data.  Last Pain:  Vitals:   10/03/22 0916  TempSrc: Temporal  PainSc: 7       Patients Stated Pain Goal: 0 ([id number])  Complications: There were no known notable events for this encounter.

## 2022-10-04 ENCOUNTER — Telehealth: Payer: Self-pay | Admitting: Cardiology

## 2022-10-04 NOTE — Telephone Encounter (Signed)
  [place] VALVE TEAM   Patient contacted regarding discharge from [facility] on 10/03/22   Patient understands to follow up with provider Kathyrn Drown NP-C  Patient understands discharge instructions? Yes  Patient understands medications and regimen? Yes  Patient understands to bring all medications to this visit? Yes   Kathyrn Drown NP-C Structural Heart Team  Pager: [phone]

## 2022-10-05 NOTE — Anesthesia Postprocedure Evaluation (Signed)
Anesthesia Post Note  Patient: [name]  Procedure(s) Performed: LEFT ATRIAL APPENDAGE OCCLUSION TRANSESOPHAGEAL ECHOCARDIOGRAM (TEE)     Patient location during evaluation: Cath Lab Anesthesia Type: General Level of consciousness: awake and alert Pain management: pain level controlled Vital Signs Assessment: post-procedure vital signs reviewed and stable Respiratory status: spontaneous breathing, nonlabored ventilation, respiratory function stable and patient connected to nasal cannula oxygen Cardiovascular status: blood pressure returned to baseline and stable Postop Assessment: no apparent nausea or vomiting Anesthetic complications: no   There were no known notable events for this encounter.  Last Vitals:  Vitals:   10/03/22 1130 10/03/22 1200  BP: (!) 152/71 (!) 150/76  Pulse: 84 84  Resp: 17 15  Temp:    SpO2: 92% (!) 89%    Last Pain:  Vitals:   10/03/22 1024  TempSrc: Oral  PainSc: 3                  Kahmya Pinkham

## 2022-10-09 ENCOUNTER — Other Ambulatory Visit: Payer: Self-pay

## 2022-10-10 ENCOUNTER — Other Ambulatory Visit (HOSPITAL_COMMUNITY): Payer: Self-pay

## 2022-10-15 DIAGNOSIS — J9601 Acute respiratory failure with hypoxia: Secondary | ICD-10-CM | POA: Diagnosis not present

## 2022-10-21 ENCOUNTER — Ambulatory Visit (INDEPENDENT_AMBULATORY_CARE_PROVIDER_SITE_OTHER): Payer: Medicare Other | Admitting: Family Medicine

## 2022-10-21 ENCOUNTER — Other Ambulatory Visit (HOSPITAL_COMMUNITY): Payer: Self-pay

## 2022-10-21 ENCOUNTER — Encounter: Payer: Self-pay | Admitting: Family Medicine

## 2022-10-21 VITALS — BP 136/80 | HR 85 | Temp 99.0°F | Ht 66.0 in | Wt 267.4 lb

## 2022-10-21 DIAGNOSIS — R600 Localized edema: Secondary | ICD-10-CM

## 2022-10-21 DIAGNOSIS — I10 Essential (primary) hypertension: Secondary | ICD-10-CM | POA: Diagnosis not present

## 2022-10-21 MED ORDER — METOPROLOL SUCCINATE ER 50 MG PO TB24
50.0000 mg | ORAL_TABLET | Freq: Two times a day (BID) | ORAL | 1 refills | Status: DC
  Filled 2022-10-21: qty 270, 135d supply, fill #0
  Filled 2022-11-26: qty 180, 90d supply, fill #0
  Filled 2023-02-27 (×2): qty 180, 90d supply, fill #1

## 2022-10-21 MED ORDER — FUROSEMIDE 20 MG PO TABS
40.0000 mg | ORAL_TABLET | Freq: Two times a day (BID) | ORAL | 3 refills | Status: DC
  Filled 2022-10-21: qty 180, 30d supply, fill #0
  Filled 2022-10-29: qty 360, 60d supply, fill #0
  Filled 2022-12-25: qty 360, 60d supply, fill #1
  Filled 2023-02-27 (×2): qty 360, 60d supply, fill #2

## 2022-10-21 NOTE — Progress Notes (Unsigned)
Established Patient Office Visit  Subjective   Patient ID: [name], female    DOB: [birth year]  Age: 72 y.o. MRN: [id number]  Chief Complaint  Patient presents with   Medical Management of Chronic Issues    Pt is here for follow up  States she had her ablation procedure done, still taking the eliquis and all of her medications, ablation was in August and then the Watchman done in February, states that everything went well and she will eventually be going off the eliquis hopefully soon.  HTN-- she continues on her current medications, no changes since her last visit. States that her BP at home have been running in the 130's/ 80's. States that her swelling is about the same as before, the lasix she is taking is controlling it well.    Current Outpatient Medications  Medication Instructions   acetaminophen (TYLENOL) 1,300 mg, Oral, Every 8 hours PRN   apixaban (ELIQUIS) 5 MG TABS tablet Take 1 tablet by mouth 2  times daily.   ascorbic acid (VITAMIN C) 500 mg, Oral, Every morning   BIOTIN PO 1 tablet, Oral, Daily at bedtime   diclofenac Sodium (VOLTAREN) 1 % GEL Apply to affected area 2 times a day   fluticasone (FLONASE) 50 MCG/ACT nasal spray 1-2 sprays, Each Nare, Daily PRN   furosemide (LASIX) 40-60 mg, Oral, 2 times daily   gabapentin (NEURONTIN) 100 MG capsule TAKE 3 CAPSULES IN THE MORNING AND 2 CAPSULES AT BEDTIME. OK TO TAKE EXTRA CAPSULE AT BEDTIME AS NEEDED   hydroxychloroquine (PLAQUENIL) 200 MG tablet Take 1 tablet (200 mg total) by mouth 2 (two) times daily with food or milk   hydroxypropyl methylcellulose / hypromellose (ISOPTO TEARS / GONIOVISC) 2.5 % ophthalmic solution 1-2 drops, Both Eyes, Every morning   losartan (COZAAR) 100 MG tablet TAKE 1 TABLET BY MOUTH ONCE A DAY   Menthol, Topical Analgesic, (BIOFREEZE) 4 % GEL 1 application , Apply externally, Daily at bedtime, Neuropathy feet/ankles   metoprolol succinate (TOPROL-XL) 50 mg, Oral, 2 times daily    Multiple Vitamins-Minerals (MULTIVITAMIN WITH MINERALS) tablet 1 tablet, Oral, 2 times daily   potassium chloride SA (KLOR-CON M) 20 MEQ tablet Take 1 tablet by mouth 2 times daily.   sulfaSALAzine (AZULFIDINE) 500 MG tablet Take 2 tablets by mouth twice a day.   Vitamin D 5,000 Units, Oral, Every morning    Patient Active Problem List   Diagnosis Date Noted   Presence of Watchman left atrial appendage closure device 10/03/2022   Atrial fibrillation ([place]) 04/01/2022   Snoring 01/02/2022   Lung nodule 10/11/2021   COVID-19 virus infection 10/10/2021   Hypokalemia 10/10/2021   Hyponatremia 10/10/2021   Acute respiratory failure with hypoxia ([place]) 10/10/2021   Prolonged QT interval 10/10/2021   Multiple thyroid nodules 0000000   Helicobacter pylori gastritis 10/05/2018   Morbid obesity ([place]) 05/12/2018   Hyperglycemia 05/12/2018   Lower extremity edema -chronic 05/12/2018   Iron deficiency anemia 05/12/2018   Erythromelalgia ([place]) 10/17/2017   Hyperparathyroidism ([place]) 05/06/2017   Lumbar stenosis with neurogenic claudication 10/11/2016   H/O breast augmentation 09/12/2016   L-S radiculopathy 05/30/2016   Neck mass 05/30/2015   Hx of colonic polyp - ssp 11/03/2014   Neuropathy 07/06/2014   Hyperlipidemia 06/02/2014   Osteoporosis 10/17/2011   Rheumatoid arthritis ([place])    Hypertension       Review of Systems  All other systems reviewed and are negative.     Objective:  BP 136/80 (BP Location: Left Arm, Patient Position: Sitting, Cuff Size: Large)   Pulse 85   Temp 99 F (37.2 C) (Oral)   Ht '5\' 6"'$  (1.676 m)   Wt 267 lb 6.4 oz (121.3 kg)   SpO2 98%   BMI 43.16 kg/m  BP Readings from Last 3 Encounters:  10/21/22 136/80  10/03/22 (!) 150/76  09/16/22 (!) 151/82      Physical Exam Vitals reviewed.  Constitutional:      Appearance: Normal appearance. She is well-groomed. She is obese.  HENT:     Head: Normocephalic and atraumatic.  Eyes:      Extraocular Movements: Extraocular movements intact.     Pupils: Pupils are equal, round, and reactive to light.  Cardiovascular:     Rate and Rhythm: Normal rate and regular rhythm.     Pulses: Normal pulses.     Heart sounds: S1 normal and S2 normal.  Pulmonary:     Effort: Pulmonary effort is normal.     Breath sounds: Normal breath sounds and air entry.  Abdominal:     General: Abdomen is flat. Bowel sounds are normal.     Palpations: Abdomen is soft.  Musculoskeletal:        General: Normal range of motion.     Cervical back: Normal range of motion and neck supple.     Right lower leg: Edema (BL 1+ pitting edema to the mid calf) present.     Left lower leg: Edema present.  Skin:    General: Skin is warm and dry.  Neurological:     Mental Status: She is alert and oriented to person, place, and time. Mental status is at baseline.     Gait: Gait is intact.  Psychiatric:        Mood and Affect: Mood and affect normal.        Speech: Speech normal.        Behavior: Behavior normal.        Judgment: Judgment normal.      No results found for any visits on 10/21/22.  Last metabolic panel Lab Results  Component Value Date   GLUCOSE 109 (H) 09/16/2022   NA 143 09/16/2022   K 4.2 09/16/2022   CL 98 09/16/2022   CO2 27 09/16/2022   BUN 12 09/16/2022   CREATININE 1.08 (H) 09/16/2022   EGFR 54 (L) 09/16/2022   CALCIUM 9.7 09/16/2022   PHOS 2.3 (L) 10/11/2021   PROT 6.5 11/05/2021   ALBUMIN 3.8 11/05/2021   BILITOT 0.3 11/05/2021   ALKPHOS 55 11/05/2021   AST 13 11/05/2021   ALT 13 11/05/2021   ANIONGAP 9 10/15/2021   Last lipids Lab Results  Component Value Date   CHOL 160 08/29/2021   HDL 42.30 08/29/2021   LDLCALC 88 08/29/2021   LDLDIRECT 153.0 05/29/2021   TRIG 149.0 08/29/2021   CHOLHDL 4 08/29/2021      The 10-year ASCVD risk score (Arnett DK, et al., 2019) is: 18.8%    Assessment & Plan:   Problem List Items Addressed This Visit        Unprioritized   Hypertension - Primary    BP is at goal today, I reviewed her medications Current hypertension medications:       Sig   furosemide (LASIX) 20 MG tablet (Taking) Take 2 - 3 tablets by mouth 2 times daily as needed for edema. (need appt for refills)   losartan (COZAAR) 100 MG tablet (Taking) TAKE 1 TABLET  BY MOUTH ONCE A DAY   metoprolol succinate (TOPROL-XL) 50 MG 24 hr tablet (Taking) Take 1 tablet (50 mg total) by mouth 2 (two) times daily. Take with or immediately following a meal.    Continue these medications. BP is well controlled.       Relevant Medications   furosemide (LASIX) 20 MG tablet   metoprolol succinate (TOPROL-XL) 50 MG 24 hr tablet   Lower extremity edema -chronic    I have refilled her furosemide 2 tabs in AM and 3 tabs in PM today, increasing the quantity for her.       Relevant Medications   furosemide (LASIX) 20 MG tablet    Return in about 6 months (around 04/23/2023) for pt would like a video visit for follow up.    Farrel Conners, MD

## 2022-10-22 NOTE — Assessment & Plan Note (Signed)
I have refilled her furosemide 2 tabs in AM and 3 tabs in PM today, increasing the quantity for her.

## 2022-10-22 NOTE — Assessment & Plan Note (Signed)
BP is at goal today, I reviewed her medications Current hypertension medications:       Sig   furosemide (LASIX) 20 MG tablet (Taking) Take 2 - 3 tablets by mouth 2 times daily as needed for edema. (need appt for refills)   losartan (COZAAR) 100 MG tablet (Taking) TAKE 1 TABLET BY MOUTH ONCE A DAY   metoprolol succinate (TOPROL-XL) 50 MG 24 hr tablet (Taking) Take 1 tablet (50 mg total) by mouth 2 (two) times daily. Take with or immediately following a meal.     Continue these medications. BP is well controlled.

## 2022-10-29 ENCOUNTER — Other Ambulatory Visit (HOSPITAL_COMMUNITY): Payer: Self-pay

## 2022-10-29 ENCOUNTER — Other Ambulatory Visit: Payer: Self-pay

## 2022-11-04 ENCOUNTER — Telehealth: Payer: Self-pay | Admitting: Cardiology

## 2022-11-04 ENCOUNTER — Other Ambulatory Visit (HOSPITAL_COMMUNITY): Payer: Self-pay

## 2022-11-04 ENCOUNTER — Ambulatory Visit: Payer: Medicare Other | Attending: Cardiology | Admitting: Cardiology

## 2022-11-04 VITALS — BP 142/86 | HR 98 | Ht 66.0 in | Wt 268.0 lb

## 2022-11-04 DIAGNOSIS — R0683 Snoring: Secondary | ICD-10-CM

## 2022-11-04 DIAGNOSIS — R400 Somnolence: Secondary | ICD-10-CM

## 2022-11-04 DIAGNOSIS — N939 Abnormal uterine and vaginal bleeding, unspecified: Secondary | ICD-10-CM | POA: Diagnosis not present

## 2022-11-04 DIAGNOSIS — Z95818 Presence of other cardiac implants and grafts: Secondary | ICD-10-CM

## 2022-11-04 DIAGNOSIS — I4819 Other persistent atrial fibrillation: Secondary | ICD-10-CM

## 2022-11-04 DIAGNOSIS — I482 Chronic atrial fibrillation, unspecified: Secondary | ICD-10-CM

## 2022-11-04 DIAGNOSIS — Z01812 Encounter for preprocedural laboratory examination: Secondary | ICD-10-CM

## 2022-11-04 MED ORDER — AMOXICILLIN 500 MG PO CAPS
2000.0000 mg | ORAL_CAPSULE | ORAL | 6 refills | Status: AC
  Filled 2022-11-04: qty 12, 3d supply, fill #0
  Filled 2023-02-27 (×2): qty 12, 3d supply, fill #1
  Filled 2023-06-11: qty 12, 3d supply, fill #2
  Filled 2023-08-16: qty 12, 3d supply, fill #3
  Filled 2023-10-08: qty 12, 3d supply, fill #4
  Filled 2023-10-30: qty 12, 3d supply, fill #5

## 2022-11-04 MED ORDER — CLOPIDOGREL BISULFATE 75 MG PO TABS
75.0000 mg | ORAL_TABLET | Freq: Every day | ORAL | 3 refills | Status: DC
  Filled 2022-11-04: qty 90, 90d supply, fill #0
  Filled 2022-12-11: qty 90, 90d supply, fill #1

## 2022-11-04 MED ORDER — APIXABAN 5 MG PO TABS: 5.0000 mg | ORAL_TABLET | Freq: Two times a day (BID) | ORAL | 5 refills | Status: DC

## 2022-11-04 NOTE — Progress Notes (Signed)
[facility]                                     Cardiology Office Note:    Date:  11/04/2022   ID:  [name] 1950-09-15, MRN [id number]  PCP:  Farrel Conners, MD  [facility] Cardiologist:  None  [facility] Electrophysiologist:  Vickie Epley, MD   Referring MD: Farrel Conners, MD   Chief Complaint  Patient presents with   Follow-up    S/p LAAO    History of Present Illness:    [name] is a 72 y.o. female with a hx of persistent atrial fibrillation s/p AF ablation 04/16/22 with Dr. Quentin Ore and chronic arthritis. She was seen by the AF Clinic 05/2022 and was referred to Dr. Quentin Ore for ablation. Given her joint pain with arthritis, she was interested in pursuing LAAO closure to allow for NSAIDS without the increased risk of bleeding associated with concomitant anticoagulant use.   Pre ablation CT reviewed which showed anatomy suitable for Watchman implant. She then underwent left atrial appendage occlusive device placement with 22mm Watchman FLX device. She was continued Eliquis 5mg  PO BID x 45 days (3/24) then transition to Plavix 75mg  PO daily (3/25) to complete 6 months of post implant therapy. Plan for CT scan 60 days post implant to assess Watchman position. Will require dental SBE for 6 months.   Today she is here with her husband and reports she has been well since implant. She has been having some vaginal bleeding which she feels is secondary to Eliquis. She is scheduled for a GYN exam. I recommend that she keep this given she is post menopausal. She denies chest pain, palpitations, SOB, LE edema, orthopnea, dizziness, or syncope.   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 Quentin Ore   Pseudogout of foot    Rheumatoid arthritis(714.0) dx 2010   Shingles    hx of    Tibial plateau fracture, right, closed, initial encounter 07/16/2017    Past Surgical History:  Procedure Laterality Date   ATRIAL FIBRILLATION ABLATION N/A 04/16/2022   Procedure: ATRIAL FIBRILLATION ABLATION;  Surgeon: Vickie Epley, MD;  Location: [place] [facility];  Service: Cardiovascular;  Laterality: N/A;   BREAST BIOPSY  1972   Broken wrist  2010   CARDIOVERSION N/A 11/22/2021   Procedure: CARDIOVERSION;  Surgeon: Geralynn Rile, MD;  Location: [place];  Service: Cardiovascular;  Laterality: N/A;   CATARACT  EXTRACTION  03/2012   left   COLONOSCOPY     COSMETIC SURGERY     FRACTURE SURGERY     INNER EAR SURGERY     busted ear drum   LEFT ATRIAL APPENDAGE OCCLUSION N/A 10/03/2022   Procedure: LEFT ATRIAL APPENDAGE OCCLUSION;  Surgeon: Vickie Epley, MD;  Location: [place] [facility];  Service: Cardiovascular;  Laterality: N/A;   MAXIMUM ACCESS (MAS)POSTERIOR LUMBAR INTERBODY FUSION (PLIF) 1 LEVEL N/A 10/11/2016   Procedure: Lumbar one-Sacral one Maximum access posterior lumbar interbody fusion;  Surgeon: Erline Levine, MD;  Location: [place];  Service: Neurosurgery;  Laterality: N/A;   ORIF WRIST FRACTURE Right 07/18/2017   Procedure: OPEN REDUCTION INTERNAL FIXATION (ORIF) WRIST FRACTURE;  Surgeon: Renette Butters, MD;  Location: [place];  Service: Orthopedics;   Laterality: Right;   PARATHYROIDECTOMY Left 04/12/2019   Procedure: LEFT SUPERIOR PARATHYROIDECTOMY;  Surgeon: [name], MD;  Location: [facility];  Service: General;  Laterality: Left;   pneumonia  2007   TEE WITHOUT CARDIOVERSION N/A 10/03/2022   Procedure: TRANSESOPHAGEAL ECHOCARDIOGRAM (TEE);  Surgeon: Vickie Epley, MD;  Location: [place] [facility];  Service: Cardiovascular;  Laterality: N/A;    Current Medications: Current Meds  Medication Sig   acetaminophen (TYLENOL) 650 MG CR tablet Take 1,300 mg by mouth every 8 (eight) hours as needed for pain.   amoxicillin (AMOXIL) 500 MG capsule Take 4 capsules (2,000 mg total) by mouth as directed.   BIOTIN PO Take 1 tablet by mouth at bedtime.   Cholecalciferol (VITAMIN D) 125 MCG (5000 UT) CAPS Take 5,000 Units by mouth in the morning.   clopidogrel (PLAVIX) 75 MG tablet Take 1 tablet (75 mg total) by mouth daily. START ON 3/25   diclofenac Sodium (VOLTAREN) 1 % GEL Apply to affected area 2 times a day   fluticasone (FLONASE) 50 MCG/ACT nasal spray Place 1-2 sprays into both nostrils daily as needed for allergies or rhinitis.   furosemide (LASIX) 20 MG tablet Take 2 - 3 tablets (40 - 60 mg total) by mouth in the morning and at bedtime.   gabapentin (NEURONTIN) 100 MG capsule TAKE 3 CAPSULES IN THE MORNING AND 2 CAPSULES AT BEDTIME. OK TO TAKE EXTRA CAPSULE AT BEDTIME AS NEEDED   hydroxychloroquine (PLAQUENIL) 200 MG tablet Take 1 tablet (200 mg total) by mouth 2 (two) times daily with food or milk   hydroxypropyl methylcellulose / hypromellose (ISOPTO TEARS / GONIOVISC) 2.5 % ophthalmic solution Place 1-2 drops into both eyes in the morning.   losartan (COZAAR) 100 MG tablet TAKE 1 TABLET BY MOUTH ONCE A DAY   Menthol, Topical Analgesic, (BIOFREEZE) 4 % GEL Apply 1 application  topically at bedtime. Neuropathy feet/ankles   metoprolol succinate (TOPROL-XL) 50 MG 24 hr tablet Take 1 tablet (50 mg total) by mouth 2 (two) times daily.    Multiple Vitamins-Minerals (MULTIVITAMIN WITH MINERALS) tablet Take 1 tablet by mouth in the morning and at bedtime.   potassium chloride SA (KLOR-CON M) 20 MEQ tablet Take 1 tablet by mouth 2 times daily.   sulfaSALAzine (AZULFIDINE) 500 MG tablet Take 2 tablets by mouth twice a day.   vitamin C (ASCORBIC ACID) 500 MG tablet Take 500 mg by mouth in the morning.   [DISCONTINUED] apixaban (ELIQUIS) 5 MG TABS tablet Take 1 tablet by mouth 2  times daily.     Allergies:   Amlodipine besylate, Divalproex sodium, Nortriptyline, Hydromorphone hcl, Other, Rosuvastatin, and Tramadol   Social History  Socioeconomic History   Marital status: Married    Spouse name: Not on file   Number of children: 3   Years of education: Not on file   Highest education level: 11th grade  Occupational History   Occupation: Retired  Tobacco Use   Smoking status: Former    Packs/day: 4.00    Years: 4.00    Additional pack years: 0.00    Total pack years: 16.00    Types: Cigarettes    Start date: 39    Quit date: 1985    Years since quitting: 39.2   Smokeless tobacco: Never  Vaping Use   Vaping Use: Never used  Substance and Sexual Activity   Alcohol use: No    Alcohol/week: 0.0 standard drinks of alcohol   Drug use: No   Sexual activity: Not on file  Other Topics Concern   Not on file  Social History Narrative   Artist -retired Building control surveyor   Married, lives with spouse, [name], he is IT support for [employer]   3 sons   2 caffeinated beverages a day   No regular exercise, diet is ok   Right handed   Social Determinants of Health   Financial Resource Strain: Low Risk  (07/18/2022)   Overall Financial Resource Strain (CARDIA)    Difficulty of Paying Living Expenses: Not hard at all  Food Insecurity: No Food Insecurity (07/18/2022)   Hunger Vital Sign    Worried About Running Out of Food in the Last Year: Never true    [place] in the Last Year: Never true   Transportation Needs: No Transportation Needs (07/18/2022)   PRAPARE - Hydrologist (Medical): No    Lack of Transportation (Non-Medical): No  Physical Activity: Inactive (07/18/2022)   Exercise Vital Sign    Days of Exercise per Week: 0 days    Minutes of Exercise per Session: 0 min  Stress: No Stress Concern Present (07/18/2022)   [place]    Feeling of Stress : Not at all  Social Connections: [place] (07/18/2022)   Social Connection and Isolation Panel [NHANES]    Frequency of Communication with Friends and Family: More than three times a week    Frequency of Social Gatherings with Friends and Family: More than three times a week    Attends Religious Services: More than 4 times per year    Active Member of [employer] or Organizations: Yes    Attends Music therapist: More than 4 times per year    Marital Status: Married     Family History: The patient's family history includes Heart attack in her father; Hypertension in her brother, father, and sister; Hyperthyroidism in her sister; Hypothyroidism in her brother; Stroke in her mother. There is no history of Colon cancer, Esophageal cancer, Rectal cancer, or Stomach cancer.  ROS:   Please see the history of present illness.    All other systems reviewed and are negative.  EKGs/Labs/Other Studies Reviewed:    The following studies were reviewed today:  CONCLUSIONS:  1.Successful implantation of a WATCHMAN left atrial appendage occlusive device    2. TEE demonstrating no LAA thrombus 3. No early apparent complications.    Post Implant Anticoagulation Strategy: Continue Eliquis 5mg  PO BID x 45 days then transition to Plavix 75mg  PO daily to complete 6 months of post implant therapy. Plan for CT scan 60 days post implant  to assess Watchman position.   EKG:  EKG is not ordered today.    Recent Labs: 11/05/2021:  ALT 13 02/13/2022: TSH 1.62 09/16/2022: BUN 12; Creatinine, Ser 1.08; Hemoglobin 14.1; Platelets 229; Potassium 4.2; Sodium 143   Recent Lipid Panel    Component Value Date/Time   CHOL 160 08/29/2021 1352   CHOL 225 (H) 06/21/2014 1146   TRIG 149.0 08/29/2021 1352   TRIG 169 (H) 06/21/2014 1146   HDL 42.30 08/29/2021 1352   HDL 48 06/21/2014 1146   CHOLHDL 4 08/29/2021 1352   VLDL 29.8 08/29/2021 1352   LDLCALC 88 08/29/2021 1352   LDLCALC 158 (H) 03/31/2020 0845   LDLCALC 143 (H) 06/21/2014 1146   LDLDIRECT 153.0 05/29/2021 1109    Physical Exam:    VS:  BP (!) 142/86   Pulse 98   Ht 5\' 6"  (1.676 m)   Wt 268 lb (121.6 kg)   SpO2 94%   BMI 43.26 kg/m     Wt Readings from Last 3 Encounters:  11/04/22 268 lb (121.6 kg)  10/21/22 267 lb 6.4 oz (121.3 kg)  10/03/22 268 lb 4.8 oz (121.7 kg)    General: Well developed, well nourished, NAD Lungs:Clear to ausculation bilaterally. No wheezes, rales, or rhonchi. Breathing is unlabored. Cardiovascular: RRR with S1 S2. No murmurs Extremities: No edema.  Neuro: Alert and oriented. No focal deficits. No facial asymmetry. MAE spontaneously. Psych: Responds to questions appropriately with normal affect.    ASSESSMENT/PLAN:    Persistent atrial fibrillation: Underwent left atrial appendage occlusive device placement with 54mm Watchman FLX device. She was continued Eliquis 5mg  PO BID x 45 days, through 3/24 then transition to Plavix 75mg  PO daily starting 3/25 to complete 6 months of post implant therapy. Will require dental SBE for 6 months. Amoxicillin sent to preferred pharm. CT instructions reviewed with understanding. Plan to take an additional Toprol the AM of CT for better HR control. Obtain BMET today. Plan 6 month follow.   Vaginal bleeding: Unclear etiology. Planning for GYN soon. Transitioning off Eliquis to Plavix starting 3/25.   Medication Adjustments/Labs and Tests Ordered: Current medicines are reviewed at length with  the patient today.  Concerns regarding medicines are outlined above.  Orders Placed This Encounter  Procedures   Basic metabolic panel   Meds ordered this encounter  Medications   amoxicillin (AMOXIL) 500 MG capsule    Sig: Take 4 capsules (2,000 mg total) by mouth as directed.    Dispense:  12 capsule    Refill:  6   clopidogrel (PLAVIX) 75 MG tablet    Sig: Take 1 tablet (75 mg total) by mouth daily. START ON 3/25    Dispense:  90 tablet    Refill:  3    STOP ELIQUIS ON 3/24 AND START ON 3/25   apixaban (ELIQUIS) 5 MG TABS tablet    Sig: Take 1 tablet by mouth 2  times daily.    Dispense:  60 tablet    Refill:  5    STOP ON 3/24 AND START PLAVIX ON 3/25    Patient Instructions  Medication Instructions:  Your physician has recommended you make the following change in your medication:  STOP ELIQUIS ON 3/24 START PLAVIX 75 MG ON 3/25 START AMOXICILLIN 500 MG - 2000 MG (4 TABLETS) 1 HOUR BEFORE DENTAL CLEANINGS AND PROCEDURES   *If you need a refill on your cardiac medications before your next appointment, please call your pharmacy*   Lab Work: TODAY: BMET  If you have labs (blood work) drawn today and your tests are completely normal, you will receive your results only by: [place] (if you have MyChart) OR A paper copy in the mail If you have any lab test that is abnormal or we need to change your treatment, we will call you to review the results.   Testing/Procedures: SEE CT INSTRUCTION LETTER   Follow-Up: At [facility], you and your health needs are our priority.  As part of our continuing mission to provide you with exceptional heart care, we have created designated Provider Care Teams.  These Care Teams include your primary Cardiologist (physician) and Advanced Practice Providers (APPs -  Physician Assistants and Nurse Practitioners) who all work together to provide you with the care you need, when you need it.  We recommend signing up for the  patient portal called "MyChart".  Sign up information is provided on this After Visit Summary.  MyChart is used to connect with patients for Virtual Visits (Telemedicine).  Patients are able to view lab/test results, encounter notes, upcoming appointments, etc.  Non-urgent messages can be sent to your provider as well.   To learn more about what you can do with MyChart, go to [email or web address].    Your next appointment:   SOMEONE FROM THE STRUCTURAL TEAM WILL CALL YOU TO SCHEDULE A FOLLOW-UP    Signed, Kathyrn Drown, NP  11/04/2022 11:59 AM    [place]

## 2022-11-04 NOTE — Patient Instructions (Signed)
Medication Instructions:  Your physician has recommended you make the following change in your medication:  STOP ELIQUIS ON 3/24 START PLAVIX 75 MG ON 3/25 START AMOXICILLIN 500 MG - 2000 MG (4 TABLETS) 1 HOUR BEFORE DENTAL CLEANINGS AND PROCEDURES   *If you need a refill on your cardiac medications before your next appointment, please call your pharmacy*   Lab Work: TODAY: BMET If you have labs (blood work) drawn today and your tests are completely normal, you will receive your results only by: [place] (if you have MyChart) OR A paper copy in the mail If you have any lab test that is abnormal or we need to change your treatment, we will call you to review the results.   Testing/Procedures: SEE CT INSTRUCTION LETTER   Follow-Up: At [facility], you and your health needs are our priority.  As part of our continuing mission to provide you with exceptional heart care, we have created designated Provider Care Teams.  These Care Teams include your primary Cardiologist (physician) and Advanced Practice Providers (APPs -  Physician Assistants and Nurse Practitioners) who all work together to provide you with the care you need, when you need it.  We recommend signing up for the patient portal called "MyChart".  Sign up information is provided on this After Visit Summary.  MyChart is used to connect with patients for Virtual Visits (Telemedicine).  Patients are able to view lab/test results, encounter notes, upcoming appointments, etc.  Non-urgent messages can be sent to your provider as well.   To learn more about what you can do with MyChart, go to [email or web address].    Your next appointment:   SOMEONE FROM THE STRUCTURAL TEAM WILL CALL YOU TO SCHEDULE A FOLLOW-UP

## 2022-11-05 LAB — BASIC METABOLIC PANEL
BUN/Creatinine Ratio: 14 (ref 12–28)
BUN: 15 mg/dL (ref 8–27)
CO2: 28 mmol/L (ref 20–29)
Calcium: 9.5 mg/dL (ref 8.7–10.3)
Chloride: 100 mmol/L (ref 96–106)
Creatinine, Ser: 1.07 mg/dL — ABNORMAL HIGH (ref 0.57–1.00)
Glucose: 108 mg/dL — ABNORMAL HIGH (ref 70–99)
Potassium: 4.3 mmol/L (ref 3.5–5.2)
Sodium: 143 mmol/L (ref 134–144)
eGFR: 55 mL/min/{1.73_m2} — ABNORMAL LOW (ref >59)

## 2022-11-19 ENCOUNTER — Other Ambulatory Visit: Payer: Self-pay

## 2022-11-19 ENCOUNTER — Other Ambulatory Visit (HOSPITAL_BASED_OUTPATIENT_CLINIC_OR_DEPARTMENT_OTHER): Payer: Self-pay

## 2022-11-19 ENCOUNTER — Encounter (HOSPITAL_BASED_OUTPATIENT_CLINIC_OR_DEPARTMENT_OTHER): Payer: Self-pay | Admitting: Emergency Medicine

## 2022-11-19 ENCOUNTER — Emergency Department (HOSPITAL_BASED_OUTPATIENT_CLINIC_OR_DEPARTMENT_OTHER)
Admission: EM | Admit: 2022-11-19 | Discharge: 2022-11-19 | Disposition: A | Discharge status: Home / Self Care | Payer: Medicare Other | Attending: Emergency Medicine | Admitting: Emergency Medicine

## 2022-11-19 ENCOUNTER — Emergency Department (HOSPITAL_BASED_OUTPATIENT_CLINIC_OR_DEPARTMENT_OTHER): Payer: Medicare Other

## 2022-11-19 DIAGNOSIS — Z7901 Long term (current) use of anticoagulants: Secondary | ICD-10-CM | POA: Insufficient documentation

## 2022-11-19 DIAGNOSIS — N939 Abnormal uterine and vaginal bleeding, unspecified: Secondary | ICD-10-CM | POA: Insufficient documentation

## 2022-11-19 DIAGNOSIS — D649 Anemia, unspecified: Secondary | ICD-10-CM | POA: Diagnosis not present

## 2022-11-19 DIAGNOSIS — Z7902 Long term (current) use of antithrombotics/antiplatelets: Secondary | ICD-10-CM | POA: Insufficient documentation

## 2022-11-19 DIAGNOSIS — I10 Essential (primary) hypertension: Secondary | ICD-10-CM | POA: Diagnosis not present

## 2022-11-19 LAB — BASIC METABOLIC PANEL
Anion gap: 8 (ref 5–15)
BUN: 15 mg/dL (ref 8–23)
CO2: 31 mmol/L (ref 22–32)
Calcium: 9.7 mg/dL (ref 8.9–10.3)
Chloride: 100 mmol/L (ref 98–111)
Creatinine, Ser: 1.05 mg/dL — ABNORMAL HIGH (ref 0.44–1.00)
GFR, Estimated: 56 mL/min — ABNORMAL LOW (ref >60)
Glucose, Bld: 126 mg/dL — ABNORMAL HIGH (ref 70–99)
Potassium: 3.8 mmol/L (ref 3.5–5.1)
Sodium: 139 mmol/L (ref 135–145)

## 2022-11-19 LAB — CBC WITH DIFFERENTIAL/PLATELET
Abs Immature Granulocytes: 0.01 10*3/uL (ref 0.00–0.07)
Basophils Absolute: 0 10*3/uL (ref 0.0–0.1)
Basophils Relative: 1 %
Eosinophils Absolute: 0.2 10*3/uL (ref 0.0–0.5)
Eosinophils Relative: 3 %
HCT: 35 % — ABNORMAL LOW (ref 36.0–46.0)
Hemoglobin: 11.6 g/dL — ABNORMAL LOW (ref 12.0–15.0)
Immature Granulocytes: 0 %
Lymphocytes Relative: 22 %
Lymphs Abs: 1.1 10*3/uL (ref 0.7–4.0)
MCH: 28.9 pg (ref 26.0–34.0)
MCHC: 33.1 g/dL (ref 30.0–36.0)
MCV: 87.1 fL (ref 80.0–100.0)
Monocytes Absolute: 0.6 10*3/uL (ref 0.1–1.0)
Monocytes Relative: 11 %
Neutro Abs: 3.4 10*3/uL (ref 1.7–7.7)
Neutrophils Relative %: 63 %
Platelets: 222 10*3/uL (ref 150–400)
RBC: 4.02 MIL/uL (ref 3.87–5.11)
RDW: 12.6 % (ref 11.5–15.5)
WBC: 5.2 10*3/uL (ref 4.0–10.5)
nRBC: 0 % (ref 0.0–0.2)

## 2022-11-19 LAB — PROTIME-INR
INR: 1.1 (ref 0.8–1.2)
Prothrombin Time: 13.6 seconds (ref 11.4–15.2)

## 2022-11-19 LAB — TSH: TSH: 1.385 u[IU]/mL (ref 0.350–4.500)

## 2022-11-19 MED ORDER — FERROUS SULFATE 325 (65 FE) MG PO TABS
325.0000 mg | ORAL_TABLET | Freq: Every day | ORAL | 0 refills | Status: DC
  Filled 2022-11-19: qty 100, 100d supply, fill #0

## 2022-11-19 NOTE — ED Notes (Signed)
Unsuccessful IV  attempt RAC, pt tolerated well 

## 2022-11-19 NOTE — ED Notes (Signed)
ED Provider at bedside. 

## 2022-11-19 NOTE — ED Triage Notes (Signed)
Pt arrives pov, to triage in wheelchair, c/o vaginal bleeding, that started with "spotting for months", increased last week with clots. Denies ABD pain Currently taking plavix

## 2022-11-19 NOTE — Discharge Instructions (Addendum)
Your hemoglobin today is mildly anemic but overall stable and your vitals are stable.  I have spoken with on-call gynecology who will try and bump your appointment up, recommend you follow-up urgently with them in clinic for repeat evaluation.  They would likely repeat an ultrasound in clinic.  Your ultrasound today was fairly nondiagnostic: IMPRESSION:  1. Lack of transvaginal imaging limited this examination.  2. Suboptimal visualization of the pelvic organs.  3. Possible fibroid on the right.  4. Ovaries not identified.  No adnexal pathology was seen.

## 2022-11-19 NOTE — ED Provider Notes (Signed)
[facility] Provider Note   CSN: [id number] Arrival date & time: 11/19/22  0818     History  Chief Complaint  Patient presents with   Vaginal Bleeding    [name] is a 72 y.o. female.   Vaginal Bleeding    72 year old female with medical history significant for atrial fibrillation previously on Eliquis, status post Watchman procedure, recently switched 1 week ago from Eliquis to Plavix who presents to the emergency department with several months of vaginal spotting which is escalated to heavy vaginal bleeding.  The patient states that her PCP had set her up for an outpatient appointment with gynecology which she has scheduled for next Monday.  She states that her bleeding has increased to the point where she was passing heavy clots this morning.  She denies any lightheadedness or fatigue or shortness of breath.  She is unclear how many pads she has gone through over the past few days but felt like her bleeding increased to the point where she decided to present to the emergency department today for evaluation.  She denies any current pelvic pain or abdominal pain.  She felt a couple of days ago a brief episode of left lower quadrant discomfort which is since resolved.  Denies any abnormal vaginal discharge.  She has no history of hysterectomy.  She has had no problems with vaginal breathing prior to this and it has been years since she went through menopause.  Home Medications Prior to Admission medications   Medication Sig Start Date End Date Taking? Authorizing Provider  ferrous sulfate 325 (65 FE) MG tablet Take 1 tablet (325 mg total) by mouth daily. 11/19/22  Yes Regan Lemming, MD  acetaminophen (TYLENOL) 650 MG CR tablet Take 1,300 mg by mouth every 8 (eight) hours as needed for pain.    [provider]  amoxicillin (AMOXIL) 500 MG capsule Take 4 capsules (2,000 mg total) by mouth as directed. 11/04/22   Tommie Raymond, NP   apixaban (ELIQUIS) 5 MG TABS tablet Take 1 tablet by mouth 2  times daily. 11/04/22   Kathyrn Drown D, NP  BIOTIN PO Take 1 tablet by mouth at bedtime.    [provider]  Cholecalciferol (VITAMIN D) 125 MCG (5000 UT) CAPS Take 5,000 Units by mouth in the morning.    [provider]  clopidogrel (PLAVIX) 75 MG tablet Take 1 tablet (75 mg total) by mouth daily. START ON 3/25 11/04/22   Kathyrn Drown D, NP  diclofenac Sodium (VOLTAREN) 1 % GEL Apply to affected area 2 times a day 08/05/22     fluticasone (FLONASE) 50 MCG/ACT nasal spray Place 1-2 sprays into both nostrils daily as needed for allergies or rhinitis.    [provider]  furosemide (LASIX) 20 MG tablet Take 2 - 3 tablets (40 - 60 mg total) by mouth in the morning and at bedtime. 10/21/22 10/21/23  Farrel Conners, MD  gabapentin (NEURONTIN) 100 MG capsule TAKE 3 CAPSULES IN THE MORNING AND 2 CAPSULES AT BEDTIME. OK TO TAKE EXTRA CAPSULE AT BEDTIME AS NEEDED 11/02/21 12/18/22  Narda Amber K, DO  hydroxychloroquine (PLAQUENIL) 200 MG tablet Take 1 tablet (200 mg total) by mouth 2 (two) times daily with food or milk 10/01/22     hydroxypropyl methylcellulose / hypromellose (ISOPTO TEARS / GONIOVISC) 2.5 % ophthalmic solution Place 1-2 drops into both eyes in the morning.    [provider]  losartan (COZAAR) 100 MG  tablet TAKE 1 TABLET BY MOUTH ONCE A DAY 08/02/22 08/02/23  Nafziger, Tommi Rumps, NP  Menthol, Topical Analgesic, (BIOFREEZE) 4 % GEL Apply 1 application  topically at bedtime. Neuropathy feet/ankles    [provider]  metoprolol succinate (TOPROL-XL) 50 MG 24 hr tablet Take 1 tablet (50 mg total) by mouth 2 (two) times daily. 10/21/22   Farrel Conners, MD  Multiple Vitamins-Minerals (MULTIVITAMIN WITH MINERALS) tablet Take 1 tablet by mouth in the morning and at bedtime.    [provider]  potassium chloride SA (KLOR-CON M) 20 MEQ tablet Take 1 tablet by mouth 2 times daily. 08/27/22    Farrel Conners, MD  sulfaSALAzine (AZULFIDINE) 500 MG tablet Take 2 tablets by mouth twice a day. 07/15/22     vitamin C (ASCORBIC ACID) 500 MG tablet Take 500 mg by mouth in the morning.    [provider]      Allergies    Amlodipine besylate, Divalproex sodium, Nortriptyline, Hydromorphone hcl, Other, Rosuvastatin, and Tramadol    Review of Systems   Review of Systems  Genitourinary:  Positive for vaginal bleeding.    Physical Exam Updated Vital Signs BP 135/63   Pulse 79   Temp 98.6 F (37 C) (Oral)   Resp 11   Wt 122.6 kg   SpO2 97%   BMI 43.62 kg/m  Physical Exam Vitals and nursing note reviewed. Exam conducted with a chaperone present.  Constitutional:      General: She is not in acute distress.    Appearance: She is well-developed.  HENT:     Head: Normocephalic and atraumatic.  Eyes:     Conjunctiva/sclera: Conjunctivae normal.  Cardiovascular:     Rate and Rhythm: Normal rate and regular rhythm.  Pulmonary:     Effort: Pulmonary effort is normal. No respiratory distress.     Breath sounds: Normal breath sounds.  Abdominal:     Palpations: Abdomen is soft.     Tenderness: There is no abdominal tenderness.  Genitourinary:    Comments: I am unable to fully visualize the cervix, dysplastic tissue appears to be present, active hemorrhage noted, cleaned with fox swabs Musculoskeletal:        General: No swelling.     Cervical back: Neck supple.  Skin:    General: Skin is warm and dry.     Capillary Refill: Capillary refill takes less than 2 seconds.  Neurological:     Mental Status: She is alert.  Psychiatric:        Mood and Affect: Mood normal.     ED Results / Procedures / Treatments   Labs (all labs ordered are listed, but only abnormal results are displayed) Labs Reviewed  CBC WITH DIFFERENTIAL/PLATELET - Abnormal; Notable for the following components:      Result Value   Hemoglobin 11.6 (*)    HCT 35.0 (*)    All other components  within normal limits  BASIC METABOLIC PANEL - Abnormal; Notable for the following components:   Glucose, Bld 126 (*)    Creatinine, Ser 1.05 (*)    GFR, Estimated 56 (*)    All other components within normal limits  PROTIME-INR  TSH    EKG EKG Interpretation  Date/Time:  Tuesday November 19 2022 09:56:59 EDT Ventricular Rate:  75 PR Interval:  187 QRS Duration: 109 QT Interval:  425 QTC Calculation: 475 R Axis:   -10 Text Interpretation: Sinus rhythm Confirmed by Regan Lemming ([id number]) on 11/19/2022 10:12:29 AM  Radiology US Pelvis Complete  Result Date: 11/19/2022 CLINICAL DATA:  heavy vaginal bleeding EXAM: TRANSABDOMINAL ULTRASOUND OF PELVIS TECHNIQUE: Transabdominalultrasound examination of the pelvis was performed including evaluation of the uterus, ovaries, adnexal regions, and pelvic cul-de-sac. COMPARISON:  None Available. FINDINGS: Uterus Uterus is suboptimally demonstrated sonographically and measures 9 x 4 x 4 cm. Possible fibroid on the right measuring 2.4 mm. Endometrium is inadequately visualized to exclude pathology measuring about 3 mm. Right ovary Not identified Left ovary Not identified Images of the adnexae demonstrated no masses or fluid collections. IMPRESSION: 1. Lack of transvaginal imaging limited this examination. 2. Suboptimal visualization of the pelvic organs. 3. Possible fibroid on the right. 4. Ovaries not identified.  No adnexal pathology was seen. Electronically Signed   By: Sammie Bench M.D.   On: 11/19/2022 11:05    Procedures Procedures    Medications Ordered in ED Medications - No data to display  ED Course/ Medical Decision Making/ A&P                             Medical Decision Making Amount and/or Complexity of Data Reviewed Labs: ordered. Radiology: ordered.     72 year old female with medical history significant for atrial fibrillation previously on Eliquis, status post Watchman procedure, recently switched 1 week ago from Eliquis to  Plavix who presents to the emergency department with several months of vaginal spotting which is escalated to heavy vaginal bleeding.  The patient states that her PCP had set her up for an outpatient appointment with gynecology which she has scheduled for next Monday.  She states that her bleeding has increased to the point where she was passing heavy clots this morning.  She denies any lightheadedness or fatigue or shortness of breath.  She is unclear how many pads she has gone through over the past few days but felt like her bleeding increased to the point where she decided to present to the emergency department today for evaluation.  She denies any current pelvic pain or abdominal pain.  She felt a couple of days ago a brief episode of left lower quadrant discomfort which is since resolved.  Denies any abnormal vaginal discharge.  She has no history of hysterectomy.  She has had no problems with vaginal breathing prior to this and it has been years since she went through menopause.  On chart review, the patient was recently seen by her cardiologist on 11/04/2022.  She was continued on Eliquis 5 mg p.o. twice daily for 45 days to conclude on 3/24 and then was subsequently transition to Plavix 75 mg on 3/25 to complete 6 months of post implant therapy post Watchman procedure.  On arrival, the patient was vitally stable, afebrile, not tachycardic, BP 150/64, heart rate 86.   Will initiate anemia workup to evaluate, obtain screening labs to include thyroid function, INR, metabolic panel.  Ultrasound of the pelvis was ordered and a pelvic exam was performed.  Pelvic US: IMPRESSION:  1. Lack of transvaginal imaging limited this examination.  2. Suboptimal visualization of the pelvic organs.  3. Possible fibroid on the right.  4. Ovaries not identified.  No adnexal pathology was seen.    Laboratory evaluation significant for TSH normal, BMP generally unremarkable, INR normal, CBC without a leukocytosis,  mild anemia present to 11.6, decreased from 14.6 several months ago however her baseline appears to be around 11-12 based on historic labs.  Patient is vitally stable, not tachycardic,  blood pressure normal.  I spoke with Dr. Elly Modena, on-call OB/GYN who did not recommend initiation of any medication beyond iron supplementation, will arrange for closer follow-up urgently in clinic to try move up the patient's upcoming appointment.  Pelvic ultrasound was fairly nondiagnostic.  Overall at this time, patient is stable for continued outpatient management.   Final Clinical Impression(s) / ED Diagnoses Final diagnoses:  Vaginal bleeding  Anemia, unspecified type    Rx / DC Orders ED Discharge Orders          Ordered    ferrous sulfate 325 (65 FE) MG tablet  Daily        11/19/22 1135              Regan Lemming, MD 11/19/22 1135

## 2022-11-22 ENCOUNTER — Telehealth: Payer: Self-pay

## 2022-11-22 NOTE — Telephone Encounter (Signed)
        Patient  visited [facility] on 4/2    Telephone encounter attempt :  2nd  A HIPAA compliant voice message was left requesting a return call.  Instructed patient to call back .    Lenard Forth [facility] Guide, [facility] [phone] [address] [place], [place], [place] [address] Phone: [phone] Email: [email or web address]

## 2022-11-22 NOTE — Telephone Encounter (Signed)
        Patient  visited [facility] on 4/2   Telephone encounter attempt :  1st  A HIPAA compliant voice message was left requesting a return call.  Instructed patient to call back .    Lenard Forth [facility] Guide, [facility] [phone] [address] [place], [place], [place] [address] Phone: [phone] Email: [email or web address]

## 2022-11-26 ENCOUNTER — Other Ambulatory Visit (HOSPITAL_COMMUNITY): Payer: Self-pay

## 2022-11-26 ENCOUNTER — Other Ambulatory Visit: Payer: Self-pay

## 2022-11-27 ENCOUNTER — Other Ambulatory Visit (HOSPITAL_COMMUNITY): Payer: Self-pay

## 2022-11-27 ENCOUNTER — Telehealth (HOSPITAL_COMMUNITY): Payer: Self-pay | Admitting: *Deleted

## 2022-11-27 NOTE — Telephone Encounter (Signed)
Reaching out to patient to offer assistance regarding upcoming cardiac imaging study; pt verbalizes understanding of appt date/time, parking situation and where to check in, pre-test NPO status and verified current allergies; name and call back number provided for further questions should they arise  Aymen Widrig RN Navigator Cardiac Imaging [facility] [phone] office [phone] cell  Patient aware to arrive at 10:30am. 

## 2022-11-28 ENCOUNTER — Ambulatory Visit (HOSPITAL_COMMUNITY)
Admission: RE | Admit: 2022-11-28 | Discharge: 2022-11-28 | Disposition: A | Discharge status: Home / Self Care | Payer: Medicare Other | Source: Ambulatory Visit | Attending: Cardiology | Admitting: Cardiology

## 2022-11-28 DIAGNOSIS — Z95818 Presence of other cardiac implants and grafts: Secondary | ICD-10-CM | POA: Insufficient documentation

## 2022-11-28 DIAGNOSIS — I482 Chronic atrial fibrillation, unspecified: Secondary | ICD-10-CM

## 2022-11-28 MED ORDER — IOHEXOL 350 MG/ML SOLN
100.0000 mL | Freq: Once | INTRAVENOUS | Status: AC | PRN
  Administered 2022-11-28: 100 mL via INTRAVENOUS

## 2022-11-29 ENCOUNTER — Ambulatory Visit (HOSPITAL_BASED_OUTPATIENT_CLINIC_OR_DEPARTMENT_OTHER): Payer: Medicare Other | Admitting: Obstetrics & Gynecology

## 2022-11-29 ENCOUNTER — Other Ambulatory Visit (HOSPITAL_COMMUNITY)
Admission: RE | Admit: 2022-11-29 | Discharge: 2022-11-29 | Disposition: A | Discharge status: Home / Self Care | Payer: Medicare Other | Source: Ambulatory Visit | Attending: Obstetrics & Gynecology | Admitting: Obstetrics & Gynecology

## 2022-11-29 ENCOUNTER — Encounter (HOSPITAL_BASED_OUTPATIENT_CLINIC_OR_DEPARTMENT_OTHER): Payer: Self-pay | Admitting: Obstetrics & Gynecology

## 2022-11-29 VITALS — BP 149/75 | HR 82 | Ht 66.0 in | Wt 266.8 lb

## 2022-11-29 DIAGNOSIS — Z124 Encounter for screening for malignant neoplasm of cervix: Secondary | ICD-10-CM | POA: Insufficient documentation

## 2022-11-29 DIAGNOSIS — N950 Postmenopausal bleeding: Secondary | ICD-10-CM | POA: Insufficient documentation

## 2022-11-29 DIAGNOSIS — N889 Noninflammatory disorder of cervix uteri, unspecified: Secondary | ICD-10-CM | POA: Diagnosis not present

## 2022-11-29 DIAGNOSIS — Z01411 Encounter for gynecological examination (general) (routine) with abnormal findings: Secondary | ICD-10-CM | POA: Insufficient documentation

## 2022-11-29 DIAGNOSIS — C539 Malignant neoplasm of cervix uteri, unspecified: Secondary | ICD-10-CM | POA: Diagnosis not present

## 2022-11-29 DIAGNOSIS — Z6841 Body Mass Index (BMI) 40.0 and over, adult: Secondary | ICD-10-CM | POA: Diagnosis not present

## 2022-11-29 DIAGNOSIS — D069 Carcinoma in situ of cervix, unspecified: Secondary | ICD-10-CM

## 2022-11-29 NOTE — Progress Notes (Signed)
GYNECOLOGY  VISIT  CC:   vaginal bleeding  HPI: 72 y.o. G3P3. Married White or Caucasian female here for evaluation for vaginal bleeding.  Pt has hx of persistent afib s/p AF ablation 04/16/2022.  Also had windsock left atrial appendage.  Was on Eliquis prior to treatment of LAA with Watchman left atrial appendage occlusive device on 10/03/2022.  On 11/19/2022 has significant vaginal bleeding that required ER evaluation.  Pt felt bleeding was due to switching from Eliquis to Plavix.  ER note reviewed.  Lab work showed hb of 11.6.  She is on iron.  Blood sugar 126.  Creatinine 1.15.  Transabdominal imaging performed.  Sub optimal images.  Reviewed personally.  Uterus with possible fibroid.  Possibly endometrium was 3mm.  Ovaries not visualized.  No free fluid noted.    Pt unsure when last pap smear was done.  Discussed with pt evaluation with pap smear and endometrial biopsy, if appropriate, for additional evaluation.  May need to repeat pelvic imaging depending on results as well.  Pt reports she really hasn't had any bleeding now since switching to Plavix.     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 Lalla Brothers   Pseudogout of foot    Rheumatoid  arthritis(714.0) dx 2010   Shingles    hx of    Tibial plateau fracture, right, closed, initial encounter 07/16/2017    MEDS:   Current Outpatient Medications on File Prior to Visit  Medication Sig Dispense Refill   acetaminophen (TYLENOL) 650 MG CR tablet Take 1,300 mg by mouth every 8 (eight) hours as needed for pain.     amoxicillin (AMOXIL) 500 MG capsule Take 4 capsules (2,000 mg total) by mouth as directed. 12 capsule 6   BIOTIN PO Take 1 tablet by mouth at bedtime.     Cholecalciferol (VITAMIN D) 125 MCG (5000 UT) CAPS Take 5,000 Units by mouth in the morning.     clopidogrel (PLAVIX) 75 MG tablet Take 1 tablet (75 mg total) by mouth daily. START ON 3/25 90 tablet 3   diclofenac Sodium (VOLTAREN) 1 % GEL Apply to affected area 2 times a day 100 g 11   ferrous sulfate 325 (65 FE) MG tablet Take 1 tablet (325 mg total) by mouth daily. 100 tablet 0   fluticasone (FLONASE) 50 MCG/ACT nasal spray Place 1-2 sprays into both nostrils daily as needed for allergies or rhinitis.     furosemide (LASIX) 20 MG tablet Take 2 - 3 tablets (40 - 60 mg total) by mouth in the morning and at bedtime. 360 tablet 3  gabapentin (NEURONTIN) 100 MG capsule TAKE 3 CAPSULES IN THE MORNING AND 2 CAPSULES AT BEDTIME. OK TO TAKE EXTRA CAPSULE AT BEDTIME AS NEEDED 540 capsule 3   hydroxychloroquine (PLAQUENIL) 200 MG tablet Take 1 tablet (200 mg total) by mouth 2 (two) times daily with food or milk 180 tablet 1   hydroxypropyl methylcellulose / hypromellose (ISOPTO TEARS / GONIOVISC) 2.5 % ophthalmic solution Place 1-2 drops into both eyes in the morning.     losartan (COZAAR) 100 MG tablet TAKE 1 TABLET BY MOUTH ONCE A DAY 90 tablet 1   Menthol, Topical Analgesic, (BIOFREEZE) 4 % GEL Apply 1 application  topically at bedtime. Neuropathy feet/ankles     metoprolol succinate (TOPROL-XL) 50 MG 24 hr tablet Take 1 tablet (50 mg total) by mouth 2 (two) times daily. 270 tablet 1   Multiple Vitamins-Minerals  (MULTIVITAMIN WITH MINERALS) tablet Take 1 tablet by mouth in the morning and at bedtime.     potassium chloride SA (KLOR-CON M) 20 MEQ tablet Take 1 tablet by mouth 2 times daily. 180 tablet 1   sulfaSALAzine (AZULFIDINE) 500 MG tablet Take 2 tablets by mouth twice a day. 360 tablet 1   vitamin C (ASCORBIC ACID) 500 MG tablet Take 500 mg by mouth in the morning.     No current facility-administered medications on file prior to visit.    ALLERGIES: Amlodipine besylate, Divalproex sodium, Nortriptyline, Hydromorphone hcl, Other, Rosuvastatin, and Tramadol  SH:  married, non smoker  Review of Systems  Constitutional: Negative.   Genitourinary:        Vaginal bleeding    PHYSICAL EXAMINATION:    BP (!) 149/75 (BP Location: Left Arm, Patient Position: Sitting, Cuff Size: Large)   Pulse 82   Ht 5\' 6"  (1.676 m) Comment: Reported  Wt 266 lb 12.8 oz (121 kg)   BMI 43.06 kg/m     General appearance: alert, cooperative and appears stated age Lymph:  no inguinal LAD noted  Pelvic: External genitalia:  no lesions              Urethra:  normal appearing urethra with no masses, tenderness or lesions              Bartholins and Skenes: normal                 Vagina: normal appearing vagina with normal color and discharge, no lesions              Cervix: large cervix with fungating and exophytic appearance, very friable.  Pap obtained with significant bleeding just with pap alone              Bimanual Exam:  Uterus:  normal size, contour, position, consistency, mobility, non-tender and mobile              Adnexa: no mass, fullness, tenderness              Rectovaginal: Yes.  .  Confirms.              Anus:  normal sphincter tone, no lesions  Given appearance of cervix, cervical biopsy recommended.  Discussed findings with pt and concern for malignancy as well as significant bleeding with biopsy.  Pt voiced clear understanding and desired to proceed with biopsy.  Cervical biopsy at 11 o'clock  obtained.  Monsel's used for good hemostasis.    Chaperone, Ina Homes, CMA, was present for exam.  Assessment/Plan: 1. Postmenopausal bleeding - cervical biopsy at 11  o'clock obtained  2. Cervical cancer screening - Cytology - PAP( [facility])  3. BMI 40.0-44.9, adult  4. Lesion of cervix - pt aware I am very concerned about cervical cancer.  Will need biopsy before referral is made but will likely need gyn oncology consultation.  Precautions for bleeding given to pt and amount of bleeding that should be seen emergently discussed.

## 2022-12-02 ENCOUNTER — Other Ambulatory Visit (HOSPITAL_BASED_OUTPATIENT_CLINIC_OR_DEPARTMENT_OTHER): Payer: Self-pay | Admitting: Obstetrics & Gynecology

## 2022-12-02 DIAGNOSIS — C531 Malignant neoplasm of exocervix: Secondary | ICD-10-CM

## 2022-12-03 ENCOUNTER — Other Ambulatory Visit (HOSPITAL_BASED_OUTPATIENT_CLINIC_OR_DEPARTMENT_OTHER): Payer: Self-pay | Admitting: Obstetrics & Gynecology

## 2022-12-03 DIAGNOSIS — C539 Malignant neoplasm of cervix uteri, unspecified: Secondary | ICD-10-CM

## 2022-12-03 LAB — SURGICAL PATHOLOGY

## 2022-12-04 LAB — CYTOLOGY - PAP: Diagnosis: HIGH — AB

## 2022-12-05 ENCOUNTER — Telehealth: Payer: Self-pay | Admitting: *Deleted

## 2022-12-05 NOTE — Telephone Encounter (Signed)
Patient called and scheduled appt. Spoke with the patient regarding the referral to GYN oncology. Patient scheduled as new patient with Dr Pricilla Holm on 5/10  at 10:30 am. Patient given an arrival time of 10 am.   Explained to the patient the the doctor will perform a pelvic exam at this visit. Patient given the policy that only one visitor allowed and that visitor must be over 16 yrs are allowed in the Cancer Center. Patient given the address/phone number for the clinic and that the center offers free valet service. Patient aware that masks are option.

## 2022-12-05 NOTE — Telephone Encounter (Signed)
LMOM for the patient to call the office back. Patient needs to be scheduled for a new patient either 4/25 or 4/26

## 2022-12-06 ENCOUNTER — Telehealth: Payer: Self-pay | Admitting: *Deleted

## 2022-12-06 NOTE — Telephone Encounter (Signed)
Moved the patient's appt from 5/10 to 4/22

## 2022-12-09 ENCOUNTER — Inpatient Hospital Stay: Payer: Medicare Other | Attending: Gynecologic Oncology | Admitting: Psychiatry

## 2022-12-09 ENCOUNTER — Encounter: Payer: Self-pay | Admitting: Oncology

## 2022-12-09 ENCOUNTER — Encounter: Payer: Self-pay | Admitting: Psychiatry

## 2022-12-09 ENCOUNTER — Telehealth: Payer: Self-pay

## 2022-12-09 VITALS — BP 157/75 | HR 81 | Temp 98.4°F | Wt 268.0 lb

## 2022-12-09 DIAGNOSIS — M1990 Unspecified osteoarthritis, unspecified site: Secondary | ICD-10-CM | POA: Insufficient documentation

## 2022-12-09 DIAGNOSIS — M069 Rheumatoid arthritis, unspecified: Secondary | ICD-10-CM | POA: Insufficient documentation

## 2022-12-09 DIAGNOSIS — Z8616 Personal history of COVID-19: Secondary | ICD-10-CM | POA: Insufficient documentation

## 2022-12-09 DIAGNOSIS — C539 Malignant neoplasm of cervix uteri, unspecified: Secondary | ICD-10-CM

## 2022-12-09 DIAGNOSIS — R600 Localized edema: Secondary | ICD-10-CM | POA: Insufficient documentation

## 2022-12-09 DIAGNOSIS — I4891 Unspecified atrial fibrillation: Secondary | ICD-10-CM | POA: Insufficient documentation

## 2022-12-09 DIAGNOSIS — Z79899 Other long term (current) drug therapy: Secondary | ICD-10-CM | POA: Insufficient documentation

## 2022-12-09 DIAGNOSIS — K219 Gastro-esophageal reflux disease without esophagitis: Secondary | ICD-10-CM | POA: Insufficient documentation

## 2022-12-09 DIAGNOSIS — C531 Malignant neoplasm of exocervix: Secondary | ICD-10-CM | POA: Diagnosis not present

## 2022-12-09 DIAGNOSIS — Z7902 Long term (current) use of antithrombotics/antiplatelets: Secondary | ICD-10-CM | POA: Insufficient documentation

## 2022-12-09 DIAGNOSIS — M810 Age-related osteoporosis without current pathological fracture: Secondary | ICD-10-CM | POA: Insufficient documentation

## 2022-12-09 DIAGNOSIS — Z800 Family history of malignant neoplasm of digestive organs: Secondary | ICD-10-CM | POA: Insufficient documentation

## 2022-12-09 DIAGNOSIS — I7381 Erythromelalgia: Secondary | ICD-10-CM | POA: Insufficient documentation

## 2022-12-09 DIAGNOSIS — I10 Essential (primary) hypertension: Secondary | ICD-10-CM | POA: Insufficient documentation

## 2022-12-09 NOTE — Telephone Encounter (Signed)
Opened in error - please disregard

## 2022-12-09 NOTE — Patient Instructions (Signed)
It was a pleasure to see you in clinic today. - We have you scheduled to get an MRI and a PET scan to better evaluate the cervical mass and if the cancer has spread anywhere. - Return visit planned for a phone visit after your imaging is complete. - We will also get you arranged to see Dr. Bertis Ruddy (medical oncology, for chemo) and Dr. Roselind Messier (radition oncology, for radiation treatment)  Thank you very much for allowing me to provide care for you today.  I appreciate your confidence in choosing our Gynecologic Oncology team at [facility].  If you have any questions about your visit today please call our office or send [place] a MyChart message and we will get back to you as soon as possible.

## 2022-12-09 NOTE — Progress Notes (Signed)
Met with [name] and explained my role as Camera operator.  Gave her the [place] and discussed the plan going forward for concurrent chemoradiation.  Provided her with my contact information and encouraged her to call with any questions or needs.

## 2022-12-09 NOTE — Progress Notes (Unsigned)
GYNECOLOGIC ONCOLOGY NEW PATIENT CONSULTATION  Date of Service: 12/09/2022 Referring Provider: Felipe Drone, MD   ASSESSMENT AND PLAN: [name] is a 72 y.o. 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  We discussed the patient's findings on exam in detail. We discussed the nature of cervical cancer and that based on the size of her cervical mass and concern for possible parametrial involvement on the left, surgical management is not recommended.  We also discussed the next steps for treatment planning. She understands the need for PET/CT to assess for evidence of metastatic disease outside of the pelvis. We will also get a pelvic MRI for radiation planning. Pending this imaging, we will make sure patient has appointments with radiation oncology and medical oncology. Reviewed treatment of chemoradiation. Reviewed typical treatment course and potential side effect.    > Her PET/CT has been scheduled for 12/23/22. Her MRI is scheduled for 12/18/22.   > She has a radiation oncology appointment with Dr. Roselind Messier on 12/25/22.   > She has a medical oncology appointment with Dr. Bertis Ruddy on 12/24/22.  Furthermore, the patient understands that if there is evidence of metastatic disease on her PET/CT that we would consider proceeding with primary chemotherapy rather than chemo-RT pending the finginds.  Phone visit follow-up following imaging.  A copy of this note was sent to the patient's referring provider.  [name], MD Gynecologic Oncology   Medical Decision Making I personally spent  TOTAL 50 minutes face-to-face and non-face-to-face in the care of this patient, which includes all pre, intra, and post visit time on the date of service.   ------------  CC: Cervical mass, SCC  HISTORY OF PRESENT ILLNESS:  [name] is a 72 y.o. woman who is seen in consultation at the request of Christine Shaggy, MD for evaluation of SCC of the cervix.  Patient  presented to her OB/GYN on 11/29/2022 for evaluation of vaginal bleeding.  In the setting of atrial fibrillation ablation and left atrial appendage, patient was on Eliquis prior to treatment of LAA with watchman left atrial appendage occlusive device on 10/03/2022.  More recently on 11/19/2022 patient noted significant vaginal bleeding that required emergency department evaluation.  At that time her hemoglobin was 11.6.  On exam, the cervix was noted to have a large fungating exophytic mass.  A Pap smear and biopsy were obtained.  Pap smear returned with H SIL.  The biopsy returned with squamous cell carcinoma, at least in situ  Today patient reports that she has not had any bleeding since her biopsy was performed.  She notes that she is now off Eliquis and currently on Plavix.  She continues to have issues with arthritis.  Given her more limited activity level due to her arthritis, she feels that this has contributed to weight gain.  She also notes chronic swelling in her lower extremities for which she is on Lasix.  She otherwise denies abdominal bloating, early satiety, significant weight loss, change in bowel or bladder habits.    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 Lalla Brothers   Pseudogout of foot    Rheumatoid arthritis(714.0) dx 2010   Shingles    hx of    Tibial plateau fracture, right, closed, initial encounter 07/16/2017    PAST SURGICAL HISTORY: Past Surgical History:  Procedure Laterality Date   ATRIAL FIBRILLATION ABLATION N/A 04/16/2022   Procedure: ATRIAL FIBRILLATION ABLATION;  Surgeon: Lanier Prude, MD;  Location: [facility] INVASIVE CV LAB;  Service: Cardiovascular;  Laterality: N/A;   BREAST BIOPSY  1972   Broken wrist  2010   CARDIOVERSION N/A 11/22/2021   Procedure: CARDIOVERSION;  Surgeon: Sande Rives, MD;  Location: [facility] ENDOSCOPY;  Service: Cardiovascular;  Laterality: N/A;   CATARACT EXTRACTION Bilateral 03/2012   COLONOSCOPY     COSMETIC SURGERY     FRACTURE SURGERY     INNER EAR SURGERY     busted ear drum   LEFT ATRIAL APPENDAGE OCCLUSION N/A 10/03/2022   Procedure: LEFT ATRIAL APPENDAGE OCCLUSION;  Surgeon: Lanier Prude, MD;  Location: [facility] INVASIVE CV LAB;  Service: Cardiovascular;  Laterality: N/A;   MAXIMUM ACCESS (MAS)POSTERIOR LUMBAR INTERBODY FUSION (PLIF) 1 LEVEL N/A 10/11/2016   Procedure: Lumbar one-Sacral one Maximum access posterior lumbar interbody fusion;  Surgeon: Maeola Harman, MD;  Location: [facility] OR;  Service: Neurosurgery;  Laterality: N/A;   ORIF WRIST FRACTURE Right 07/18/2017   Procedure: OPEN REDUCTION INTERNAL FIXATION (ORIF) WRIST FRACTURE;  Surgeon: Sheral Apley, MD;  Location: [facility] OR;  Service: Orthopedics;  Laterality: Right;   PARATHYROIDECTOMY Left 04/12/2019   Procedure: LEFT SUPERIOR PARATHYROIDECTOMY;  Surgeon: Darnell Level, MD;  Location: [facility] ORS;  Service: General;  Laterality: Left;   TEE WITHOUT CARDIOVERSION N/A 10/03/2022   Procedure: TRANSESOPHAGEAL ECHOCARDIOGRAM (TEE);  Surgeon: Lanier Prude, MD;  Location: [facility] INVASIVE CV LAB;  Service: Cardiovascular;  Laterality: N/A;    OB/GYN HISTORY: OB History  Gravida Para Term  Preterm AB Living  3 3 3     3   SAB IAB Ectopic Multiple Live Births          3    # Outcome Date GA Lbr Len/2nd Weight Sex Delivery Anes PTL Lv  3 Term      Vag-Spont   LIV  2 Term      Vag-Spont   LIV  1 Term      Vag-Spont   LIV      Age at menarche: 59 Age at menopause: 25 Hx of HRT: None Hx of STI: None Last pap: 10 years ago approximately [facility], Dr. Randell Patient) History of abnormal pap smears: Denies  SCREENING STUDIES:  Last mammogram: 01/2018 Last colonoscopy: 09/2018, repeat in 5 years  MEDICATIONS:  Current Outpatient Medications:    acetaminophen (TYLENOL) 650 MG CR tablet, Take 1,300 mg by mouth every 8 (eight) hours as needed for pain., Disp: , Rfl:    amoxicillin (AMOXIL) 500 MG capsule, Take 4 capsules (2,000 mg total) by mouth as directed., Disp: 12 capsule, Rfl: 6   BIOTIN PO, Take  1 tablet by mouth at bedtime., Disp: , Rfl:    Cholecalciferol (VITAMIN D) 125 MCG (5000 UT) CAPS, Take 5,000 Units by mouth in the morning., Disp: , Rfl:    clopidogrel (PLAVIX) 75 MG tablet, Take 1 tablet (75 mg total) by mouth daily. START ON 3/25, Disp: 90 tablet, Rfl: 3   diclofenac Sodium (VOLTAREN) 1 % GEL, Apply to affected area 2 times a day, Disp: 100 g, Rfl: 11   ferrous sulfate 325 (65 FE) MG tablet, Take 1 tablet (325 mg total) by mouth daily., Disp: 100 tablet, Rfl: 0   fluticasone (FLONASE) 50 MCG/ACT nasal spray, Place 1-2 sprays into both nostrils daily as needed for allergies or rhinitis., Disp: , Rfl:    furosemide (LASIX) 20 MG tablet, Take 2 - 3 tablets (40 - 60 mg total) by mouth in the morning and at bedtime., Disp: 360 tablet, Rfl: 3   gabapentin (NEURONTIN) 100 MG capsule, TAKE 3 CAPSULES IN THE MORNING AND 2 CAPSULES AT BEDTIME. OK TO TAKE EXTRA CAPSULE AT BEDTIME AS NEEDED, Disp: 540 capsule, Rfl: 3   hydroxychloroquine (PLAQUENIL) 200 MG tablet, Take 1 tablet (200 mg total) by mouth 2 (two) times daily with food or milk, Disp: 180 tablet, Rfl: 1    hydroxypropyl methylcellulose / hypromellose (ISOPTO TEARS / GONIOVISC) 2.5 % ophthalmic solution, Place 1-2 drops into both eyes in the morning., Disp: , Rfl:    losartan (COZAAR) 100 MG tablet, TAKE 1 TABLET BY MOUTH ONCE A DAY, Disp: 90 tablet, Rfl: 1   Menthol, Topical Analgesic, (BIOFREEZE) 4 % GEL, Apply 1 application  topically at bedtime. Neuropathy feet/ankles, Disp: , Rfl:    metoprolol succinate (TOPROL-XL) 50 MG 24 hr tablet, Take 1 tablet (50 mg total) by mouth 2 (two) times daily., Disp: 270 tablet, Rfl: 1   Multiple Vitamins-Minerals (MULTIVITAMIN WITH MINERALS) tablet, Take 1 tablet by mouth in the morning and at bedtime., Disp: , Rfl:    potassium chloride SA (KLOR-CON M) 20 MEQ tablet, Take 1 tablet by mouth 2 times daily., Disp: 180 tablet, Rfl: 1   sulfaSALAzine (AZULFIDINE) 500 MG tablet, Take 2 tablets by mouth twice a day., Disp: 360 tablet, Rfl: 1   vitamin C (ASCORBIC ACID) 500 MG tablet, Take 500 mg by mouth in the morning., Disp: , Rfl:   ALLERGIES: Allergies  Allergen Reactions   Amlodipine Besylate Other (See Comments)    Tremors   Divalproex Sodium Swelling and Other (See Comments)   Nortriptyline Nausea And Vomiting and Other (See Comments)    tremors   Hydromorphone Hcl Other (See Comments)    Headache, muscle tightness   Other     Band-aid (skin redness)   Rosuvastatin Other (See Comments)    Muscle aches, pains. Resolved with stopping medication.  Muscle aches / pains   Tramadol Other (See Comments)    Felt "stoned"    FAMILY HISTORY: Family History  Problem Relation Age of Onset   Stroke Mother    Hypertension Father    Heart attack Father    Hypertension Sister        x 3   Hyperthyroidism Sister        x2, s/p RAI ablation   Liver cancer Sister    Hypertension Brother    Hypothyroidism Brother    Colon cancer Neg Hx    Esophageal cancer Neg Hx    Rectal cancer Neg Hx    Stomach cancer Neg Hx    Breast cancer Neg  Hx    Ovarian cancer  Neg Hx    Endometrial cancer Neg Hx    Pancreatic cancer Neg Hx    Prostate cancer Neg Hx     SOCIAL HISTORY: Social History   Socioeconomic History   Marital status: Married    Spouse name: Not on file   Number of children: 3   Years of education: Not on file   Highest education level: 11th grade  Occupational History   Occupation: Retired  Tobacco Use   Smoking status: Former    Packs/day: 4.00    Years: 4.00    Additional pack years: 0.00    Total pack years: 16.00    Types: Cigarettes    Start date: 68    Quit date: 1985    Years since quitting: 39.3   Smokeless tobacco: Never  Vaping Use   Vaping Use: Never used  Substance and Sexual Activity   Alcohol use: No    Alcohol/week: 0.0 standard drinks of alcohol   Drug use: No   Sexual activity: Not Currently  Other Topics Concern   Not on file  Social History Narrative   Artist -retired Designer, fashion/clothing   Married, lives with spouse, Dannielle Huh, he is IT support for [employer]   3 sons   2 caffeinated beverages a day   No regular exercise, diet is ok   Right handed   Social Determinants of Health   Financial Resource Strain: Low Risk  (07/18/2022)   Overall Financial Resource Strain (CARDIA)    Difficulty of Paying Living Expenses: Not hard at all  Food Insecurity: No Food Insecurity (07/18/2022)   Hunger Vital Sign    Worried About Running Out of Food in the Last Year: Never true    Ran Out of Food in the Last Year: Never true  Transportation Needs: No Transportation Needs (07/18/2022)   PRAPARE - Administrator, Civil Service (Medical): No    Lack of Transportation (Non-Medical): No  Physical Activity: Inactive (07/18/2022)   Exercise Vital Sign    Days of Exercise per Week: 0 days    Minutes of Exercise per Session: 0 min  Stress: No Stress Concern Present (07/18/2022)   [employer] of Occupational Health - Occupational Stress Questionnaire    Feeling of Stress : Not at  all  Social Connections: Socially Integrated (07/18/2022)   Social Connection and Isolation Panel [NHANES]    Frequency of Communication with Friends and Family: More than three times a week    Frequency of Social Gatherings with Friends and Family: More than three times a week    Attends Religious Services: More than 4 times per year    Active Member of [employer] or Organizations: Yes    Attends Engineer, structural: More than 4 times per year    Marital Status: Married  Catering manager Violence: Not At Risk (07/18/2022)   Humiliation, Afraid, Rape, and Kick questionnaire    Fear of Current or Ex-Partner: No    Emotionally Abused: No    Physically Abused: No    Sexually Abused: No    REVIEW OF SYSTEMS: New patient intake form was reviewed.  Complete 10-system review is negative except for the following: none  PHYSICAL EXAM: BP (!) 157/75 (BP Location: Left Arm, Patient Position: Sitting)   Pulse 81   Temp 98.4 F (36.9 C) (Oral)   Wt 268 lb (121.6 kg)   SpO2 100%   BMI 43.26 kg/m  Constitutional:  No acute distress. Neuro/Psych: Alert, oriented.  Head and Neck: Normocephalic, atraumatic. Neck symmetric without masses. Sclera anicteric.  Respiratory: Normal work of breathing. Clear to auscultation bilaterally. Cardiovascular: Regular rate and rhythm, no murmurs, rubs, or gallops. Abdomen: Normoactive bowel sounds. Soft, non-distended, non-tender to palpation. No masses or hepatosplenomegaly appreciated. No evidence of hernia. No palpable fluid wave.  Extremities: Grossly normal range of motion. Warm, well perfused. No edema bilaterally. Skin: No rashes or lesions. Lymphatic: No cervical, supraclavicular, or inguinal adenopathy. Genitourinary: External genitalia without lesions. Urethral meatus without lesions or prolapse. On speculum exam, the cervix with replaced with a fungating mass that fills the vaginal apex, visually approximately 5-6cm. No overt vaginal involvement  visualized. Bimanual exam reveals an approximate 6cm cervical mass. No vaginal involvement of the right vaginal apex. The mass abuts the vaginal mucosa at the left apex. Rectovaginal exam confirms the above findings and reveals normal sphincter tone. Slight thickening of the left parametria. Exam chaperoned by Renaldo Reel, RN   LABORATORY AND RADIOLOGIC DATA: Outside medical records were reviewed to synthesize the above history, along with the history and physical obtained during the visit.  Outside laboratory, pathology, and imaging reports were reviewed, with pertinent results below.    WBC  Date Value Ref Range Status  11/19/2022 5.2 4.0 - 10.5 K/uL Final   Hemoglobin  Date Value Ref Range Status  11/19/2022 11.6 (L) 12.0 - 15.0 g/dL Final  56/21/3086 57.8 11.1 - 15.9 g/dL Final   HCT  Date Value Ref Range Status  11/19/2022 35.0 (L) 36.0 - 46.0 % Final   Hematocrit  Date Value Ref Range Status  09/16/2022 41.7 34.0 - 46.6 % Final   Platelets  Date Value Ref Range Status  11/19/2022 222 150 - 400 K/uL Final  09/16/2022 229 150 - 450 x10E3/uL Final   Magnesium  Date Value Ref Range Status  10/13/2021 2.1 1.7 - 2.4 mg/dL Final    Comment:    Performed at [facility], [address] [place], [place] [address]   Creat  Date Value Ref Range Status  08/17/2021 1.59 (H) 0.60 - 1.00 mg/dL Final   Creatinine, Ser  Date Value Ref Range Status  11/19/2022 1.05 (H) 0.44 - 1.00 mg/dL Final   AST  Date Value Ref Range Status  11/05/2021 13 0 - 37 U/L Final   ALT  Date Value Ref Range Status  11/05/2021 13 0 - 35 U/L Final   Diagnosis  Date Value Ref Range Status  11/29/2022 (A)  Final   - High grade squamous intraepithelial lesion (HSIL)    Surgical pathology (11/29/22): FINAL MICROSCOPIC DIAGNOSIS:   A. CERVIX, 10 O'CLOCK, BIOPSY:       Squamous cell carcinoma, at least in situ.       See comment.   COMMENT:   The specimen demonstrates  multiple superficially sampled squamous  epithelium with high-grade cytological dysplasia forming papillary  architecture. There is no submucosal sampling which hinders the  evaluation on invasion. Clinical correlation is recommended.   The case was peer-reviewed by Dr. Luisa Hart who concurs with the above  diagnosis.    US Pelvis Complete 11/19/2022  Narrative CLINICAL DATA:  heavy vaginal bleeding  EXAM: TRANSABDOMINAL ULTRASOUND OF PELVIS  TECHNIQUE: Transabdominalultrasound examination of the pelvis was performed including evaluation of the uterus, ovaries, adnexal regions, and pelvic cul-de-sac.  COMPARISON:  None Available.  FINDINGS: Uterus  Uterus is suboptimally demonstrated sonographically and measures 9 x 4 x 4 cm. Possible  fibroid on the right measuring 2.4 mm. Endometrium is inadequately visualized to exclude pathology measuring about 3 mm.  Right ovary  Not identified  Left ovary  Not identified  Images of the adnexae demonstrated no masses or fluid collections.  IMPRESSION: 1. Lack of transvaginal imaging limited this examination. 2. Suboptimal visualization of the pelvic organs. 3. Possible fibroid on the right. 4. Ovaries not identified.  No adnexal pathology was seen.   Electronically Signed By: Layla Maw M.D. On: 11/19/2022 11:05

## 2022-12-11 ENCOUNTER — Other Ambulatory Visit: Payer: Self-pay | Admitting: Neurology

## 2022-12-11 ENCOUNTER — Encounter: Payer: Self-pay | Admitting: Psychiatry

## 2022-12-11 ENCOUNTER — Other Ambulatory Visit (HOSPITAL_COMMUNITY): Payer: Self-pay

## 2022-12-18 ENCOUNTER — Ambulatory Visit (HOSPITAL_COMMUNITY)
Admission: RE | Admit: 2022-12-18 | Discharge: 2022-12-18 | Disposition: A | Discharge status: Home / Self Care | Payer: Medicare Other | Source: Ambulatory Visit | Attending: Psychiatry | Admitting: Psychiatry

## 2022-12-18 DIAGNOSIS — K5730 Diverticulosis of large intestine without perforation or abscess without bleeding: Secondary | ICD-10-CM | POA: Diagnosis not present

## 2022-12-18 DIAGNOSIS — C531 Malignant neoplasm of exocervix: Secondary | ICD-10-CM | POA: Diagnosis present

## 2022-12-18 MED ORDER — GADOBUTROL 1 MMOL/ML IV SOLN
10.0000 mL | Freq: Once | INTRAVENOUS | Status: AC | PRN
  Administered 2022-12-18: 10 mL via INTRAVENOUS

## 2022-12-23 ENCOUNTER — Other Ambulatory Visit: Payer: Self-pay | Admitting: Oncology

## 2022-12-23 ENCOUNTER — Encounter: Payer: Self-pay | Admitting: Oncology

## 2022-12-23 ENCOUNTER — Encounter (HOSPITAL_COMMUNITY): Payer: Medicare Other

## 2022-12-23 NOTE — Progress Notes (Signed)
Gynecologic Oncology Multi-Disciplinary Disposition Conference Note  Date of the Conference: 12/23/2022  Patient Name: [name]  Referring Provider: Dr. Hyacinth Meeker  Primary GYN Oncologist: Dr. Alvester Morin   Stage/Disposition:  Stage IIB squamous cell carcinoma of the cervix. Disposition is to definitive chemoradiation plus pembrolizumab depending on PD-L1 results.  This Multidisciplinary conference took place involving physicians from Gynecologic Oncology, Medical Oncology, Radiation Oncology, Pathology, Radiology along with the Gynecologic Oncology Nurse Practitioner and Gynecologic Oncology Nurse Navigator.  Comprehensive assessment of the patient's malignancy, staging, need for surgery, chemotherapy, radiation therapy, and need for further testing were reviewed. Supportive measures, both inpatient and following discharge were also discussed. The recommended plan of care is documented. Greater than 35 minutes were spent correlating and coordinating this patient's care.

## 2022-12-24 ENCOUNTER — Inpatient Hospital Stay: Payer: Medicare Other | Attending: Hematology and Oncology | Admitting: Hematology and Oncology

## 2022-12-24 ENCOUNTER — Encounter: Payer: Self-pay | Admitting: Hematology and Oncology

## 2022-12-24 ENCOUNTER — Telehealth: Payer: Self-pay | Admitting: Oncology

## 2022-12-24 ENCOUNTER — Other Ambulatory Visit: Payer: Self-pay | Admitting: Gynecologic Oncology

## 2022-12-24 ENCOUNTER — Other Ambulatory Visit: Payer: Self-pay

## 2022-12-24 VITALS — BP 151/68 | HR 79 | Temp 98.9°F | Resp 18 | Ht 66.0 in | Wt 267.0 lb

## 2022-12-24 DIAGNOSIS — C531 Malignant neoplasm of exocervix: Secondary | ICD-10-CM

## 2022-12-24 DIAGNOSIS — Z79899 Other long term (current) drug therapy: Secondary | ICD-10-CM | POA: Diagnosis not present

## 2022-12-24 DIAGNOSIS — M069 Rheumatoid arthritis, unspecified: Secondary | ICD-10-CM | POA: Insufficient documentation

## 2022-12-24 DIAGNOSIS — C539 Malignant neoplasm of cervix uteri, unspecified: Secondary | ICD-10-CM | POA: Insufficient documentation

## 2022-12-24 DIAGNOSIS — Z6841 Body Mass Index (BMI) 40.0 and over, adult: Secondary | ICD-10-CM | POA: Diagnosis not present

## 2022-12-24 DIAGNOSIS — R5381 Other malaise: Secondary | ICD-10-CM | POA: Insufficient documentation

## 2022-12-24 DIAGNOSIS — Z87891 Personal history of nicotine dependence: Secondary | ICD-10-CM | POA: Diagnosis not present

## 2022-12-24 DIAGNOSIS — G609 Hereditary and idiopathic neuropathy, unspecified: Secondary | ICD-10-CM | POA: Diagnosis not present

## 2022-12-24 DIAGNOSIS — G8929 Other chronic pain: Secondary | ICD-10-CM | POA: Insufficient documentation

## 2022-12-24 NOTE — Assessment & Plan Note (Signed)
Her baseline performance status is poor I am concerned about risk of toxicities

## 2022-12-24 NOTE — Assessment & Plan Note (Signed)
I have reviewed her MRI and recent biopsy report Unfortunately, PET CT scan has been rescheduled We discussed the importance of staging of disease I am concerned about her significant major comorbidities at baseline and poor baseline performance status Adding chemo sensitizing agent is only currently you minimum benefit but with the expense of high risk of toxicities Due to her severe baseline peripheral neuropathy, I do not believe she is a candidate for cisplatin While carboplatin can be used to substitute for cisplatin, the benefit is not going to be as robust We discussed the risk, benefits, side effects of cisplatin versus carboplatin The patient is undecided We will review test results of PET/CT imaging next week and she will think about her decision whether to proceed with chemo sensitizing agent or not

## 2022-12-24 NOTE — Telephone Encounter (Signed)
Called [name] and advised her that her PET has been rescheduled to 12/30/22 at [facility].  Also discussed that we will reschedule her appointments with Dr. Roselind Messier to after the PET on 12/31/22.  Advised we will print her a new schedule when she is here today to see Dr. Bertis Ruddy.

## 2022-12-24 NOTE — Assessment & Plan Note (Signed)
I noted that the patient has class III obesity The calculated dose of chemotherapy will be high

## 2022-12-24 NOTE — Assessment & Plan Note (Signed)
She has severe baseline peripheral neuropathy on gabapentin and is quite debilitated She will continue chronic pain management by her primary care doctor

## 2022-12-24 NOTE — Assessment & Plan Note (Signed)
I believe her back pain is caused by rheumatoid arthritis She will continue chronic pain management as directed by her primary care doctor/rheumatologist

## 2022-12-24 NOTE — Progress Notes (Signed)
[facility] CONSULT NOTE  Patient Care Team: [name], MD as PCP - General (Family Medicine) Lanier Prude, MD as PCP - Electrophysiology (Cardiology) Keturah Barre, MD (Otolaryngology) Teresa Coombs, MD (Ophthalmology) Donnetta Hail, MD as Consulting Physician (Rheumatology) Glendale Chard, DO as Consulting Physician (Neurology) Carlus Pavlov, MD as Consulting Physician (Internal Medicine)  ASSESSMENT & PLAN:  Cervical cancer [facility]) I have reviewed her MRI and recent biopsy report Unfortunately, PET CT scan has been rescheduled We discussed the importance of staging of disease I am concerned about her significant major comorbidities at baseline and poor baseline performance status Adding chemo sensitizing agent is only currently you minimum benefit but with the expense of high risk of toxicities Due to her severe baseline peripheral neuropathy, I do not believe she is a candidate for cisplatin While carboplatin can be used to substitute for cisplatin, the benefit is not going to be as robust We discussed the risk, benefits, side effects of cisplatin versus carboplatin The patient is undecided We will review test results of PET/CT imaging next week and she will think about her decision whether to proceed with chemo sensitizing agent or not  Idiopathic neuropathy She has severe baseline peripheral neuropathy on gabapentin and is quite debilitated She will continue chronic pain management by her primary care doctor  Rheumatoid arthritis (HCC) I believe her back pain is caused by rheumatoid arthritis She will continue chronic pain management as directed by her primary care doctor/rheumatologist  Physical debility Her baseline performance status is poor I am concerned about risk of toxicities  Obesity, Class III, BMI 40-49.9 (morbid obesity) (HCC) I noted that the patient has class III obesity The calculated dose of chemotherapy will be  high  No orders of the defined types were placed in this encounter.   The total time spent in the appointment was 60 minutes encounter with patients including review of chart and various tests results, discussions about plan of care and coordination of care plan   All questions were answered. The patient knows to call the clinic with any problems, questions or concerns. No barriers to learning was detected.  Artis Delay, MD 5/7/20241:42 PM  CHIEF COMPLAINTS/PURPOSE OF CONSULTATION:  Cervical cancer She is here accompanied by her husband HISTORY OF PRESENTING ILLNESS:  [name] 72 y.o. female is here because of recent abnormal findings suspicious for cervical cancer The patient was anticoagulated due to cardiac procedure and had postmenopausal bleeding Since her biopsy, her bleeding has ceased She denies pelvic pain The patient had significant peripheral neuropathy, diffuse back pain and joint pain throughout due to rheumatoid arthritis At baseline, the patient does not move around much and uses a wheelchair at home to go from room to room  I have reviewed her chart and materials related to her cancer extensively and collaborated history with the patient. Summary of oncologic history is as follows: Oncology History  Cervical cancer (HCC)  11/29/2022 Pathology Results   FINAL MICROSCOPIC DIAGNOSIS:   A. CERVIX, 10 O'CLOCK, BIOPSY:       Squamous cell carcinoma, at least in situ.       See comment.   COMMENT:   The specimen demonstrates multiple superficially sampled squamous epithelium with high-grade cytological dysplasia forming papillary architecture. There is no submucosal sampling which hinders the evaluation on invasion. Clinical correlation is recommended.     12/19/2022 Imaging   MRI pelvis 7.8 cm solid mass centered in the uterine cervix, causing hydrometros. Tumor  involves the upper 2/3 of the vagina and parametrial soft tissues bilaterally, without evidence of  hydronephrosis or pelvic sidewall involvement. FIGO stage IIB   Mild bilateral iliac lymphadenopathy, suspicious for metastatic disease.   Sigmoid diverticulosis. No radiographic evidence of diverticulitis.   12/24/2022 Initial Diagnosis   Cervical cancer (HCC)   12/24/2022 Cancer Staging   Staging form: Cervix Uteri, AJCC Version 9 - Clinical stage from 12/24/2022: Stage IIIC1 (cT2b, cN1, cM0) - Signed by Artis Delay, MD on 12/24/2022 Stage prefix: Initial diagnosis     MEDICAL HISTORY:  Past Medical History:  Diagnosis Date   Allergy    Cataract    BILATERAL-REMOVED 2 YEARS AGO   COVID 09/2021   Erythromelalgia (HCC)    followed by neuro Dr Allena Katz , mgd on gabapentin , dx several years ago    GERD (gastroesophageal reflux disease)    Helicobacter pylori gastritis 10/05/2018   Hx of colonic polyp - ssp 11/03/2014   Hypercalcemia    Hypertension    Left maxillary fracture (HCC) 07/17/2017   fell down my stairs 2 year ago , deneis any metal in place nor difficulty with jax extension    Neuropathy    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 Lalla Brothers   Pseudogout of foot    Rheumatoid arthritis(714.0) dx 2010   Shingles    hx of    Tibial plateau fracture, right, closed, initial encounter 07/16/2017    SURGICAL HISTORY: Past Surgical History:  Procedure Laterality Date   ATRIAL FIBRILLATION ABLATION N/A 04/16/2022   Procedure: ATRIAL FIBRILLATION ABLATION;  Surgeon: Lanier Prude, MD;  Location: [facility] INVASIVE CV LAB;  Service: Cardiovascular;  Laterality: N/A;   BREAST BIOPSY  1972   Broken wrist  2010   CARDIOVERSION N/A 11/22/2021   Procedure: CARDIOVERSION;  Surgeon: Sande Rives, MD;  Location: [facility] ENDOSCOPY;  Service:  Cardiovascular;  Laterality: N/A;   CATARACT EXTRACTION Bilateral 03/2012   COLONOSCOPY     COSMETIC SURGERY     FRACTURE SURGERY     INNER EAR SURGERY     busted ear drum   LEFT ATRIAL APPENDAGE OCCLUSION N/A 10/03/2022   Procedure: LEFT ATRIAL APPENDAGE OCCLUSION;  Surgeon: Lanier Prude, MD;  Location: [facility] INVASIVE CV LAB;  Service: Cardiovascular;  Laterality: N/A;   MAXIMUM ACCESS (MAS)POSTERIOR LUMBAR INTERBODY FUSION (PLIF) 1 LEVEL N/A 10/11/2016   Procedure: Lumbar one-Sacral one Maximum access posterior lumbar interbody fusion;  Surgeon: Maeola Harman, MD;  Location: [facility] OR;  Service: Neurosurgery;  Laterality: N/A;   ORIF WRIST FRACTURE Right 07/18/2017   Procedure: OPEN REDUCTION INTERNAL FIXATION (ORIF) WRIST FRACTURE;  Surgeon: Sheral Apley, MD;  Location: [facility] OR;  Service: Orthopedics;  Laterality: Right;   PARATHYROIDECTOMY Left 04/12/2019   Procedure: LEFT SUPERIOR PARATHYROIDECTOMY;  Surgeon: Darnell Level, MD;  Location: [facility] ORS;  Service: General;  Laterality: Left;   TEE WITHOUT CARDIOVERSION N/A 10/03/2022   Procedure: TRANSESOPHAGEAL ECHOCARDIOGRAM (TEE);  Surgeon: Lanier Prude, MD;  Location: [facility] INVASIVE CV LAB;  Service: Cardiovascular;  Laterality: N/A;    SOCIAL HISTORY: Social History   Socioeconomic History   Marital status: Married    Spouse name: Not on file   Number of  children: 3   Years of education: Not on file   Highest education level: 11th grade  Occupational History   Occupation: Retired  Tobacco Use   Smoking status: Former    Packs/day: 4.00    Years: 4.00    Additional pack years: 0.00    Total pack years: 16.00    Types: Cigarettes    Start date: 82    Quit date: 1985    Years since quitting: 39.3   Smokeless tobacco: Never  Vaping Use   Vaping Use: Never used  Substance and Sexual Activity   Alcohol use: No    Alcohol/week: 0.0 standard drinks of alcohol   Drug use: No   Sexual activity: Not Currently  Other Topics  Concern   Not on file  Social History Narrative   Artist -retired Designer, fashion/clothing   Married, lives with spouse, Dannielle Huh, he is IT support for [employer]   3 sons   2 caffeinated beverages a day   No regular exercise, diet is ok   Right handed   Social Determinants of Health   Financial Resource Strain: Low Risk  (07/18/2022)   Overall Financial Resource Strain (CARDIA)    Difficulty of Paying Living Expenses: Not hard at all  Food Insecurity: No Food Insecurity (07/18/2022)   Hunger Vital Sign    Worried About Running Out of Food in the Last Year: Never true    Ran Out of Food in the Last Year: Never true  Transportation Needs: No Transportation Needs (07/18/2022)   PRAPARE - Administrator, Civil Service (Medical): No    Lack of Transportation (Non-Medical): No  Physical Activity: Inactive (07/18/2022)   Exercise Vital Sign    Days of Exercise per Week: 0 days    Minutes of Exercise per Session: 0 min  Stress: No Stress Concern Present (07/18/2022)   [employer] of Occupational Health - Occupational Stress Questionnaire    Feeling of Stress : Not at all  Social Connections: Socially Integrated (07/18/2022)   Social Connection and Isolation Panel [NHANES]    Frequency of Communication with Friends and Family: More than three times a week    Frequency of Social Gatherings with Friends and Family: More than three times a week    Attends Religious Services: More than 4 times per year    Active Member of [employer] or Organizations: Yes    Attends Engineer, structural: More than 4 times per year    Marital Status: Married  Catering manager Violence: Not At Risk (07/18/2022)   Humiliation, Afraid, Rape, and Kick questionnaire    Fear of Current or Ex-Partner: No    Emotionally Abused: No    Physically Abused: No    Sexually Abused: No    FAMILY HISTORY: Family History  Problem Relation Age of Onset   Stroke Mother    Hypertension Father     Heart attack Father    Hypertension Sister        x 3   Hyperthyroidism Sister        x2, s/p RAI ablation   Liver cancer Sister    Hypertension Brother    Hypothyroidism Brother    Colon cancer Neg Hx    Esophageal cancer Neg Hx    Rectal cancer Neg Hx    Stomach cancer Neg Hx    Breast cancer Neg Hx    Ovarian cancer Neg Hx    Endometrial cancer Neg Hx  Pancreatic cancer Neg Hx    Prostate cancer Neg Hx     ALLERGIES:  is allergic to amlodipine besylate, divalproex sodium, nortriptyline, hydromorphone hcl, other, rosuvastatin, and tramadol.  MEDICATIONS:  Current Outpatient Medications  Medication Sig Dispense Refill   acetaminophen (TYLENOL) 650 MG CR tablet Take 1,300 mg by mouth every 8 (eight) hours as needed for pain.     amoxicillin (AMOXIL) 500 MG capsule Take 4 capsules (2,000 mg total) by mouth as directed. 12 capsule 6   BIOTIN PO Take 1 tablet by mouth at bedtime.     Cholecalciferol (VITAMIN D) 125 MCG (5000 UT) CAPS Take 5,000 Units by mouth in the morning.     clopidogrel (PLAVIX) 75 MG tablet Take 1 tablet (75 mg total) by mouth daily. START ON 3/25 90 tablet 3   ferrous sulfate 325 (65 FE) MG tablet Take 1 tablet (325 mg total) by mouth daily. 100 tablet 0   fluticasone (FLONASE) 50 MCG/ACT nasal spray Place 1-2 sprays into both nostrils daily as needed for allergies or rhinitis.     furosemide (LASIX) 20 MG tablet Take 2 - 3 tablets (40 - 60 mg total) by mouth in the morning and at bedtime. 360 tablet 3   gabapentin (NEURONTIN) 100 MG capsule TAKE 3 CAPSULES IN THE MORNING AND 2 CAPSULES AT BEDTIME. OK TO TAKE EXTRA CAPSULE AT BEDTIME AS NEEDED 540 capsule 3   hydroxychloroquine (PLAQUENIL) 200 MG tablet Take 1 tablet (200 mg total) by mouth 2 (two) times daily with food or milk 180 tablet 1   hydroxypropyl methylcellulose / hypromellose (ISOPTO TEARS / GONIOVISC) 2.5 % ophthalmic solution Place 1-2 drops into both eyes in the morning.     losartan (COZAAR)  100 MG tablet TAKE 1 TABLET BY MOUTH ONCE A DAY 90 tablet 1   Menthol, Topical Analgesic, (BIOFREEZE) 4 % GEL Apply 1 application  topically at bedtime. Neuropathy feet/ankles     metoprolol succinate (TOPROL-XL) 50 MG 24 hr tablet Take 1 tablet (50 mg total) by mouth 2 (two) times daily. 270 tablet 1   Multiple Vitamins-Minerals (MULTIVITAMIN WITH MINERALS) tablet Take 1 tablet by mouth in the morning and at bedtime.     potassium chloride SA (KLOR-CON M) 20 MEQ tablet Take 1 tablet by mouth 2 times daily. 180 tablet 1   sulfaSALAzine (AZULFIDINE) 500 MG tablet Take 2 tablets by mouth twice a day. 360 tablet 1   vitamin C (ASCORBIC ACID) 500 MG tablet Take 500 mg by mouth in the morning.     No current facility-administered medications for this visit.    REVIEW OF SYSTEMS: She has baseline chronic constipation Constitutional: Denies fevers, chills or abnormal night sweats Eyes: Denies blurriness of vision, double vision or watery eyes Ears, nose, mouth, throat, and face: Denies mucositis or sore throat Respiratory: Denies cough, dyspnea or wheezes Cardiovascular: Denies palpitation, chest discomfort or lower extremity swelling Skin: Denies abnormal skin rashes Lymphatics: Denies new lymphadenopathy or easy bruising Behavioral/Psych: Mood is stable, no new changes  All other systems were reviewed with the patient and are negative.  PHYSICAL EXAMINATION: ECOG PERFORMANCE STATUS: 2 - Symptomatic, <50% confined to bed  Vitals:   12/24/22 1239  BP: (!) 151/68  Pulse: 79  Resp: 18  Temp: 98.9 F (37.2 C)  SpO2: 97%   Filed Weights   12/24/22 1239  Weight: 267 lb (121.1 kg)    GENERAL:alert, no distress and comfortable SKIN: skin color, texture, turgor are normal, no rashes  or significant lesions EYES: normal, conjunctiva are pink and non-injected, sclera clear OROPHARYNX:no exudate, no erythema and lips, buccal mucosa, and tongue normal  NECK: supple, thyroid normal size,  non-tender, without nodularity LYMPH:  no palpable lymphadenopathy in the cervical, axillary or inguinal LUNGS: clear to auscultation and percussion with normal breathing effort HEART: regular rate & rhythm and no murmurs and no lower extremity edema ABDOMEN:abdomen soft, non-tender and normal bowel sounds Musculoskeletal:no cyanosis of digits and no clubbing  PSYCH: alert & oriented x 3 with fluent speech NEURO: no focal motor/sensory deficits  LABORATORY DATA:  I have reviewed the data as listed Lab Results  Component Value Date   WBC 5.2 11/19/2022   HGB 11.6 (L) 11/19/2022   HCT 35.0 (L) 11/19/2022   MCV 87.1 11/19/2022   PLT 222 11/19/2022   Recent Labs    09/16/22 1114 11/04/22 1010 11/19/22 0915  NA 143 143 139  K 4.2 4.3 3.8  CL 98 100 100  CO2 27 28 31   GLUCOSE 109* 108* 126*  BUN 12 15 15   CREATININE 1.08* 1.07* 1.05*  CALCIUM 9.7 9.5 9.7  GFRNONAA  --   --  56*    RADIOGRAPHIC STUDIES: I have personally reviewed the radiological images as listed and agreed with the findings in the report. MR Pelvis W Wo Contrast  Result Date: 12/19/2022 CLINICAL DATA:  Cervical carcinoma. EXAM: MRI PELVIS WITHOUT AND WITH CONTRAST TECHNIQUE: Multiplanar multisequence MR imaging of the pelvis was performed both before and after administration of intravenous contrast. CONTRAST:  10mL GADAVIST GADOBUTROL 1 MMOL/ML IV SOLN COMPARISON:  None Available. FINDINGS: Lower Urinary Tract: No urinary bladder or urethral abnormality identified. Bowel: Sigmoid diverticulosis noted, without evidence of diverticulitis. Vascular/Lymphatic: 10 mm left iliac lymph node is seen at the iliac bifurcation on image 16/23. 11 mm right external iliac lymph node is seen on image 23/23. Other sub-cm bilateral iliac lymph nodes are seen, which are not considered pathologically enlarged. Reproductive: -- Uterus: A solid mass is seen which is centered in the cervix, which measures 7.8 x 5.0 x 5.8 cm. This mass also  shows involvement of the lower uterine corpus causing hydrometros, and involvement of the upper 2/3 of the vagina. There is also evidence of mild tumor extension into the parametrial soft tissues bilaterally, although there is no evidence of hydronephrosis or pelvic sidewall involvement. Tumor also abuts the anterior wall of the upper rectum (e.g. Image 11/8), but there is no evidence of rectal mucosal involvement. -- Right ovary: Appears normal. No ovarian or adnexal masses identified. -- Left ovary: Appears normal. No ovarian or adnexal masses identified. Other: No peritoneal thickening or abnormal free fluid. Musculoskeletal:  Unremarkable. IMPRESSION: 7.8 cm solid mass centered in the uterine cervix, causing hydrometros. Tumor involves the upper 2/3 of the vagina and parametrial soft tissues bilaterally, without evidence of hydronephrosis or pelvic sidewall involvement. FIGO stage IIB Mild bilateral iliac lymphadenopathy, suspicious for metastatic disease. Sigmoid diverticulosis. No radiographic evidence of diverticulitis. Electronically Signed   By: Danae Orleans M.D.   On: 12/19/2022 11:47   CT CARDIAC MORPH/PULM VEIN W/CM&W/O CA SCORE  Addendum Date: 11/30/2022   ADDENDUM REPORT: 11/30/2022 13:48 EXAM: OVER-READ INTERPRETATION  CT CHEST The following report is an over-read performed by radiologist Dr. Rosette Reveal [facility] on 11/30/2022. This over-read does not include interpretation of cardiac or coronary anatomy or pathology. The coronary CTA interpretation by the cardiologist is attached. COMPARISON:  Cardiac CT 04/11/2022 FINDINGS: Vascular: Mild aortic atherosclerosis.  The included aorta is normal in caliber. Mediastinum/nodes: No adenopathy or mass.  Minimal hiatal hernia. Lungs: No focal airspace disease. No pulmonary nodule. No pleural fluid. The included airways are patent. Upper abdomen: No acute or unexpected findings. Musculoskeletal: There are no acute or suspicious osseous  abnormalities. Partially included bilateral breast implants, partially peripherally calcified. IMPRESSION: 1. No acute extracardiac findings. 2. Minimal hiatal hernia. 3.  Aortic Atherosclerosis (ICD10-I70.0). Electronically Signed   By: Narda Rutherford M.D.   On: 11/30/2022 13:48   Result Date: 11/30/2022 CLINICAL DATA:  Post Watchman EXAM: Cardiac Gated CTA TECHNIQUE: The patient was scanned on a Siemens Force 192 slice scanner. Gantry rotation speed was 250 msec with a temporal resolution of 66 msec. A prospective scan was triggered in the ascending thoracic aorta at 140 HU's Data sets were reconstructed with full mA between 35% and 75% of the R-R interval Images were reviewed using VRT, MIP and MPR modes. Double oblique images were used to measure the PV diameter and areas. The patient received 80 cc of contrast at 5 cc/sec CONTRAST:  Isovue 370 total 80 cc COMPARISON:  04/11/22 FINDINGS: Moderate bi atrial enlargement No residual PFO/shunt post trans septal puncture. Mild ascending thoracic aorta dilatation 3.9 cm. No pericardial effusion There is a small < 1/3 shoulder on the mitral annulus side of the device The device is well endothelialized with no leak Average compression 13.4 % LUPV:  Ostium 16.3 mm   area 2.4 cm2 LLPV:   Ostium 18.4 mm  area 2.2 cm2 RUPV:  Ostium 20.1 mm  area 3.1 cm2 RLPV:  Ostium 19.2 mm  area 3.5 cm2 IMPRESSION: 1.  Well placed 31 mm FLX Watchman device with < 1/3 mitral shoulder 2.  No leak with average compression 13.4% and well endothelialized 3.  Moderate bi atrial enlargement 4.  No residual ASD/shunt post trans septal puncture 5.  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  No pericardial effusion 7.  Normal PV anatomy see measurements above Electronically Signed: By: Charlton Haws M.D. On: 11/28/2022 11:38

## 2022-12-25 ENCOUNTER — Other Ambulatory Visit: Payer: Self-pay | Admitting: Neurology

## 2022-12-25 ENCOUNTER — Ambulatory Visit: Payer: Medicare Other | Admitting: Radiation Oncology

## 2022-12-25 ENCOUNTER — Ambulatory Visit: Payer: Medicare Other

## 2022-12-25 ENCOUNTER — Other Ambulatory Visit: Payer: Self-pay

## 2022-12-25 DIAGNOSIS — C539 Malignant neoplasm of cervix uteri, unspecified: Secondary | ICD-10-CM

## 2022-12-26 NOTE — Progress Notes (Signed)
GYN Location of Tumor / Histology: Cervical Malignant neoplasm of exocervix   Pt to have PET scan on 12-30-22  MR Pelvis W Wo Contrast 12-18-22 IMPRESSION: 7.8 cm solid mass centered in the uterine cervix, causing hydrometros. Tumor involves the upper 2/3 of the vagina and parametrial soft tissues bilaterally, without evidence of hydronephrosis or pelvic sidewall involvement. FIGO stage IIB   Mild bilateral iliac lymphadenopathy, suspicious for metastatic disease.   Sigmoid diverticulosis. No radiographic evidence of diverticulitis.    [name] presented with symptoms of: bleeding  Biopsies revealed:  11-29-22 FINAL MICROSCOPIC DIAGNOSIS:   A. CERVIX, 10 O'CLOCK, BIOPSY:       Squamous cell carcinoma, at least in situ.       See comment.   COMMENT:   The specimen demonstrates multiple superficially sampled squamous  epithelium with high-grade cytological dysplasia forming papillary  architecture. There is no submucosal sampling which hinders the  evaluation on invasion. Clinical correlation is recommended.   Tests and Procedures Follow-ups  11/29/2022  Cytology - PAP( [facility])  ADEQUACY: Satisfactory for evaluation; transformation zone component PRESENT. DIAGNOSIS: - High grade squamous intraepithelial lesion (HSIL) Important  COMMENT - CYTOLOGY: There are features present suspicious for squamous cell carcinoma. COMMENT - CYTOLOGY: Tissue studies are recommended.    Past/Anticipated interventions by Gyn/Onc surgery, if any:  ASSESSMENT AND PLAN: [name] is a 72 y.o. 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   We discussed the patient's findings on exam in detail. We discussed the nature of cervical cancer and that based on the size of her cervical mass and concern for possible parametrial involvement on the left, surgical management is not recommended.   We also discussed the next steps for treatment planning. She  understands the need for PET/CT to assess for evidence of metastatic disease outside of the pelvis. We will also get a pelvic MRI for radiation planning. Pending this imaging, we will make sure patient has appointments with radiation oncology and medical oncology. Reviewed treatment of chemoradiation. Reviewed typical treatment course and potential side effect.    > Her PET/CT has been scheduled for 12/23/22. Her MRI is scheduled for 12/18/22.   > She has a radiation oncology appointment with Dr. Roselind Messier on 12/25/22.   > She has a medical oncology appointment with Dr. Bertis Ruddy on 12/24/22.   Furthermore, the patient understands that if there is evidence of metastatic disease on her PET/CT that we would consider proceeding with primary chemotherapy rather than chemo-RT pending the finginds.   Past/Anticipated interventions by medical oncology, if any:  Dr. Bertis Ruddy on 12-24-22 Cervical cancer [facility])  11/29/2022 Pathology Results    FINAL MICROSCOPIC DIAGNOSIS:   A. CERVIX, 10 O'CLOCK, BIOPSY:       Squamous cell carcinoma, at least in situ.       See comment.   COMMENT:   The specimen demonstrates multiple superficially sampled squamous epithelium with high-grade cytological dysplasia forming papillary architecture. There is no submucosal sampling which hinders the evaluation on invasion. Clinical correlation is recommended.       12/19/2022 Imaging    MRI pelvis 7.8 cm solid mass centered in the uterine cervix, causing hydrometros. Tumor involves the upper 2/3 of the vagina and parametrial soft tissues bilaterally, without evidence of hydronephrosis or pelvic sidewall involvement. FIGO stage IIB   Mild bilateral iliac lymphadenopathy, suspicious for metastatic disease.   Sigmoid diverticulosis. No radiographic evidence of diverticulitis.    12/24/2022 Initial Diagnosis  Cervical cancer (HCC)    12/24/2022 Cancer Staging    Staging form: Cervix Uteri, AJCC Version 9 - Clinical stage from 12/24/2022:  Stage IIIC1 (cT2b, cN1, cM0) - Signed by Artis Delay, MD on 12/24/2022 Stage prefix: Initial diagnosis     ASSESSMENT & PLAN:  Cervical cancer (HCC) I have reviewed her MRI and recent biopsy report Unfortunately, PET CT scan has been rescheduled We discussed the importance of staging of disease I am concerned about her significant major comorbidities at baseline and poor baseline performance status Adding chemo sensitizing agent is only currently you minimum benefit but with the expense of high risk of toxicities Due to her severe baseline peripheral neuropathy, I do not believe she is a candidate for cisplatin While carboplatin can be used to substitute for cisplatin, the benefit is not going to be as robust We discussed the risk, benefits, side effects of cisplatin versus carboplatin The patient is undecided We will review test results of PET/CT imaging next week and she will think about her decision whether to proceed with chemo sensitizing agent or not   Idiopathic neuropathy She has severe baseline peripheral neuropathy on gabapentin and is quite debilitated She will continue chronic pain management by her primary care doctor   Rheumatoid arthritis (HCC) I believe her back pain is caused by rheumatoid arthritis She will continue chronic pain management as directed by her primary care doctor/rheumatologist   Physical debility Her baseline performance status is poor I am concerned about risk of toxicities   Obesity, Class III, BMI 40-49.9 (morbid obesity) (HCC) I noted that the patient has class III obesity The calculated dose of chemotherapy will be high Weight changes, if any: {:18581}  Bowel/Bladder complaints, if any: {yes no:[id number]}, {Blank single:19197::"diarrhea","constipation","urinary frequency","burning","trouble emptying bladder"," "}  Nausea/Vomiting, if any: {:18581}  Pain issues, if any:  {:18581}  SAFETY ISSUES: Prior radiation? {:18581} Pacemaker/ICD?  {:18581} Possible current pregnancy? no Is the patient on methotrexate? no  Current Complaints / other details:  The patient had significant peripheral neuropathy, diffuse back pain and joint pain throughout due to rheumatoid arthritis At baseline, the patient does not move around much and uses a wheelchair at home to go from room to room

## 2022-12-27 ENCOUNTER — Ambulatory Visit: Payer: Medicare Other | Admitting: Gynecologic Oncology

## 2022-12-30 ENCOUNTER — Encounter: Payer: Self-pay | Admitting: Psychiatry

## 2022-12-30 ENCOUNTER — Ambulatory Visit
Admission: RE | Admit: 2022-12-30 | Discharge: 2022-12-30 | Disposition: A | Discharge status: Home / Self Care | Payer: Medicare Other | Source: Ambulatory Visit | Attending: Gynecologic Oncology | Admitting: Gynecologic Oncology

## 2022-12-30 ENCOUNTER — Inpatient Hospital Stay (HOSPITAL_BASED_OUTPATIENT_CLINIC_OR_DEPARTMENT_OTHER): Payer: Medicare Other | Admitting: Psychiatry

## 2022-12-30 DIAGNOSIS — E041 Nontoxic single thyroid nodule: Secondary | ICD-10-CM | POA: Insufficient documentation

## 2022-12-30 DIAGNOSIS — I2510 Atherosclerotic heart disease of native coronary artery without angina pectoris: Secondary | ICD-10-CM | POA: Diagnosis not present

## 2022-12-30 DIAGNOSIS — C538 Malignant neoplasm of overlapping sites of cervix uteri: Secondary | ICD-10-CM

## 2022-12-30 DIAGNOSIS — C539 Malignant neoplasm of cervix uteri, unspecified: Secondary | ICD-10-CM

## 2022-12-30 DIAGNOSIS — G609 Hereditary and idiopathic neuropathy, unspecified: Secondary | ICD-10-CM | POA: Diagnosis not present

## 2022-12-30 DIAGNOSIS — I700 Atherosclerosis of aorta: Secondary | ICD-10-CM | POA: Insufficient documentation

## 2022-12-30 DIAGNOSIS — C531 Malignant neoplasm of exocervix: Secondary | ICD-10-CM

## 2022-12-30 DIAGNOSIS — Z923 Personal history of irradiation: Secondary | ICD-10-CM | POA: Insufficient documentation

## 2022-12-30 DIAGNOSIS — Z9221 Personal history of antineoplastic chemotherapy: Secondary | ICD-10-CM | POA: Diagnosis not present

## 2022-12-30 DIAGNOSIS — Z7189 Other specified counseling: Secondary | ICD-10-CM

## 2022-12-30 LAB — GLUCOSE, CAPILLARY: Glucose-Capillary: 108 mg/dL — ABNORMAL HIGH (ref 70–99)

## 2022-12-30 MED ORDER — FLUDEOXYGLUCOSE F - 18 (FDG) INJECTION
12.0000 | Freq: Once | INTRAVENOUS | Status: AC
  Administered 2022-12-30: 13 via INTRAVENOUS

## 2022-12-30 NOTE — Progress Notes (Signed)
Gynecologic Oncology Telehealth Follow-Up Note  I connected with [name] on 12/30/22 at  4:00 PM EDT by telephone and verified that I am speaking with the correct person using two identifiers.  I discussed the limitations, risks, security and privacy concerns of performing an evaluation and management service by telemedicine and the availability of in-person appointments. I also discussed with the patient that there may be a patient responsible charge related to this service. The patient expressed understanding and agreed to proceed.  Other persons participating in the visit and their role in the encounter: Patient, [name] (Husband) .  Patient's location: Home, [place] Provider's location: [facility]  Date of Service: 12/30/2022 Referring Provider: [name], MD   Assessment & Plan: [name] is a 72 y.o. woman with Stage IIIC1(r) squamous cell carcinoma of the cervix who presents for treatment counseling.  We reviewed patient's imaging results in detail.  The MRI pelvis noted her cervical mass to measure 7.8 cm.  It was also felt to involve the upper vagina and bilateral parametrial tissue.  The MRI also demonstrated bilateral iliac lymphadenopathy suspicious for metastatic disease.  Today she underwent her PET scan which noted a hypermetabolic cervical mass as well as hypermetabolic metastatic bilateral iliac chain lymph nodes.  Of note, there was mild hypermetabolic and enlarged left cervical lymph nodes which were nonspecific.  Patient reports some recent throat soreness and left enlarged tonsil which may be a contributing factor to this finding on imaging, less concerning for metastatic disease.  Given this, patient appears to have stage III C1 disease.  Recommend treatment with chemoRT.  Patient was previously counseled by Dr. Bertis Ruddy on her concerns regarding cisplatin in the setting of patient's known chronic neuropathy.  Carboplatin could instead be utilized for radiation  sensitization.  Reviewed with patient that cisplatin is standard of care as a radiation sensitizer.  However, we do use carboplatin in place of cisplatin for other treatment and gynecologic cancer and has been suggested as a potential alternative for individuals with contraindication to cisplatin.  Although no large trial has been performed, smaller series of data have demonstrated that carboplatin is overall well-tolerated in place of cisplatin for radiation sensitizing [name] et al, Gynecol Oncol 2011; Tharavichitkul E et al, [facility] Cancer 2016; Dubay RA et al, Gynecol Oncol 2004).  This can be given weekly as an AUC of 2.  Discussed with patient that if symptoms arise, carboplatin could be discontinued at any time.  Patient will discuss further with Dr. Bertis Ruddy.  Additionally, given recent results of keynote A18, given her locally advanced disease, I would recommend the addition of pembrolizumab.  Patient additionally has a CPS score of 1.  Reviewed briefly this treatment with the patient, including treatment during radiation as well as maintenance therapy thereafter.  In summary, I would recommend chemoRT plus pembrolizumab (based on Keynote A18) with consideration of carbo to replace cisplatin for radiation sensitizing given history of neuropathy.   Pt has a consult with Dr. Roselind Messier tomorrow and will get scheduled for follow-up with Dr. Bertis Ruddy.   RTC following treatment.  [name], MD Gynecologic Oncology   Medical Decision Making I personally spent  TOTAL 33 minutes face-to-face and non-face-to-face in the care of this patient, which includes all pre, intra, and post visit time on the date of service.  3 minutes spent reviewing records prior to the visit 26 Minutes in patient contact (phone visit) 4 minutes charting , conferring with consultants etc.   ----------------------- Reason  for Visit: Follow-up, treatment counseling  Treatment History: Oncology History Overview Note   PD-L1 CPS 1%   Cervical cancer (HCC)  11/29/2022 Pathology Results   FINAL MICROSCOPIC DIAGNOSIS:   A. CERVIX, 10 O'CLOCK, BIOPSY:       Squamous cell carcinoma, at least in situ.       See comment.   COMMENT:   The specimen demonstrates multiple superficially sampled squamous epithelium with high-grade cytological dysplasia forming papillary architecture. There is no submucosal sampling which hinders the evaluation on invasion. Clinical correlation is recommended.     12/19/2022 Imaging   MRI pelvis 7.8 cm solid mass centered in the uterine cervix, causing hydrometros. Tumor involves the upper 2/3 of the vagina and parametrial soft tissues bilaterally, without evidence of hydronephrosis or pelvic sidewall involvement. FIGO stage IIB   Mild bilateral iliac lymphadenopathy, suspicious for metastatic disease.   Sigmoid diverticulosis. No radiographic evidence of diverticulitis.   12/24/2022 Initial Diagnosis   Cervical cancer (HCC)   12/24/2022 Cancer Staging   Staging form: Cervix Uteri, AJCC Version 9 - Clinical stage from 12/24/2022: Stage IIIC1 (cT2b, cN1, cM0) - Signed by Artis Delay, MD on 12/24/2022 Stage prefix: Initial diagnosis     Interval History: Since the patient's last visit with me, she has undergone an MRI pelvis, a visit with Dr. Bertis Ruddy, and she underwent a PET scan today.  No new complaints today other than patient does note a sore throat for few days and a swollen left tonsil.  She reports that this happens every year around this time with following changes.   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 Lalla Brothers   Pseudogout of foot    Rheumatoid arthritis(714.0) dx 2010   Shingles    hx of    Tibial plateau fracture, right, closed, initial encounter 07/16/2017    Past Surgical History:  Procedure Laterality Date   ATRIAL FIBRILLATION ABLATION N/A 04/16/2022   Procedure: ATRIAL FIBRILLATION ABLATION;  Surgeon: Lanier Prude, MD;  Location: [facility] INVASIVE CV LAB;  Service: Cardiovascular;  Laterality: N/A;   BREAST BIOPSY  1972   Broken wrist  2010   CARDIOVERSION N/A 11/22/2021   Procedure: CARDIOVERSION;  Surgeon: Sande Rives, MD;  Location: [facility] ENDOSCOPY;  Service: Cardiovascular;  Laterality: N/A;   CATARACT EXTRACTION Bilateral 03/2012   COLONOSCOPY     COSMETIC SURGERY     FRACTURE SURGERY     INNER EAR SURGERY     busted ear drum   LEFT ATRIAL APPENDAGE OCCLUSION N/A 10/03/2022   Procedure: LEFT ATRIAL APPENDAGE  OCCLUSION;  Surgeon: Lanier Prude, MD;  Location: [facility] INVASIVE CV LAB;  Service: Cardiovascular;  Laterality: N/A;   MAXIMUM ACCESS (MAS)POSTERIOR LUMBAR INTERBODY FUSION (PLIF) 1 LEVEL N/A 10/11/2016   Procedure: Lumbar one-Sacral one Maximum access posterior lumbar interbody fusion;  Surgeon: Maeola Harman, MD;  Location: [facility] OR;  Service: Neurosurgery;  Laterality: N/A;   ORIF WRIST FRACTURE Right 07/18/2017   Procedure: OPEN REDUCTION INTERNAL FIXATION (ORIF) WRIST FRACTURE;  Surgeon: Sheral Apley, MD;  Location: [facility] OR;  Service: Orthopedics;  Laterality: Right;   PARATHYROIDECTOMY Left 04/12/2019   Procedure: LEFT  SUPERIOR PARATHYROIDECTOMY;  Surgeon: Darnell Level, MD;  Location: [facility] ORS;  Service: General;  Laterality: Left;   TEE WITHOUT CARDIOVERSION N/A 10/03/2022   Procedure: TRANSESOPHAGEAL ECHOCARDIOGRAM (TEE);  Surgeon: Lanier Prude, MD;  Location[facility] INVASIVE CV LAB;  Service: Cardiovascular;  Laterality: N/A;    Family History  Problem Relation Age of Onset   Stroke Mother    Hypertension Father    Heart attack Father    Hypertension Sister        x 3   Hyperthyroidism Sister        x2, s/p RAI ablation   Liver cancer Sister    Hypertension Brother    Hypothyroidism Brother    Colon cancer Neg Hx    Esophageal cancer Neg Hx    Rectal cancer Neg Hx    Stomach cancer Neg Hx    Breast cancer Neg Hx    Ovarian cancer Neg Hx    Endometrial cancer Neg Hx    Pancreatic cancer Neg Hx    Prostate cancer Neg Hx     Social History   Socioeconomic History   Marital status: Married    Spouse name: Not on file   Number of children: 3   Years of education: Not on file   Highest education level: 11th grade  Occupational History   Occupation: Retired  Tobacco Use   Smoking status: Former    Packs/day: 4.00    Years: 4.00    Additional pack years: 0.00    Total pack years: 16.00    Types: Cigarettes    Start date: 68    Quit date: 1985    Years since quitting: 39.3   Smokeless tobacco: Never  Vaping Use   Vaping Use: Never used  Substance and Sexual Activity   Alcohol use: No    Alcohol/week: 0.0 standard drinks of alcohol   Drug use: No   Sexual activity: Not Currently  Other Topics Concern   Not on file  Social History Narrative   Artist -retired Designer, fashion/clothing   Married, lives with spouse, Dannielle Huh, he is IT support for [employer]   3 sons   2 caffeinated beverages a day   No regular exercise, diet is ok   Right handed   Social Determinants of Health   Financial Resource Strain: Low Risk  (07/18/2022)   Overall Financial Resource Strain  (CARDIA)    Difficulty of Paying Living Expenses: Not hard at all  Food Insecurity: No Food Insecurity (07/18/2022)   Hunger Vital Sign    Worried About Running Out of Food in the Last Year: Never true    Ran Out of Food in the Last Year: Never true  Transportation Needs: No Transportation Needs (07/18/2022)   PRAPARE - Administrator, Civil Service (Medical): No    Lack of Transportation (Non-Medical): No  Physical Activity: Inactive (07/18/2022)  Exercise Vital Sign    Days of Exercise per Week: 0 days    Minutes of Exercise per Session: 0 min  Stress: No Stress Concern Present (07/18/2022)   [employer] of Occupational Health - Occupational Stress Questionnaire    Feeling of Stress : Not at all  Social Connections: Socially Integrated (07/18/2022)   Social Connection and Isolation Panel [NHANES]    Frequency of Communication with Friends and Family: More than three times a week    Frequency of Social Gatherings with Friends and Family: More than three times a week    Attends Religious Services: More than 4 times per year    Active Member of [employer] or Organizations: Yes    Attends Engineer, structural: More than 4 times per year    Marital Status: Married    Current Medications:  Current Outpatient Medications:    acetaminophen (TYLENOL) 650 MG CR tablet, Take 1,300 mg by mouth every 8 (eight) hours as needed for pain., Disp: , Rfl:    amoxicillin (AMOXIL) 500 MG capsule, Take 4 capsules (2,000 mg total) by mouth as directed., Disp: 12 capsule, Rfl: 6   BIOTIN PO, Take 1 tablet by mouth at bedtime., Disp: , Rfl:    Cholecalciferol (VITAMIN D) 125 MCG (5000 UT) CAPS, Take 5,000 Units by mouth in the morning., Disp: , Rfl:    clopidogrel (PLAVIX) 75 MG tablet, Take 1 tablet (75 mg total) by mouth daily. START ON 3/25, Disp: 90 tablet, Rfl: 3   ferrous sulfate 325 (65 FE) MG tablet, Take 1 tablet (325 mg total) by mouth daily., Disp: 100 tablet, Rfl: 0    fluticasone (FLONASE) 50 MCG/ACT nasal spray, Place 1-2 sprays into both nostrils daily as needed for allergies or rhinitis., Disp: , Rfl:    furosemide (LASIX) 20 MG tablet, Take 2 - 3 tablets (40 - 60 mg total) by mouth in the morning and at bedtime., Disp: 360 tablet, Rfl: 3   gabapentin (NEURONTIN) 100 MG capsule, TAKE 3 CAPSULES IN THE MORNING AND 2 CAPSULES AT BEDTIME. OK TO TAKE EXTRA CAPSULE AT BEDTIME AS NEEDED, Disp: 540 capsule, Rfl: 3   hydroxychloroquine (PLAQUENIL) 200 MG tablet, Take 1 tablet (200 mg total) by mouth 2 (two) times daily with food or milk, Disp: 180 tablet, Rfl: 1   hydroxypropyl methylcellulose / hypromellose (ISOPTO TEARS / GONIOVISC) 2.5 % ophthalmic solution, Place 1-2 drops into both eyes in the morning., Disp: , Rfl:    losartan (COZAAR) 100 MG tablet, TAKE 1 TABLET BY MOUTH ONCE A DAY, Disp: 90 tablet, Rfl: 1   Menthol, Topical Analgesic, (BIOFREEZE) 4 % GEL, Apply 1 application  topically at bedtime. Neuropathy feet/ankles, Disp: , Rfl:    metoprolol succinate (TOPROL-XL) 50 MG 24 hr tablet, Take 1 tablet (50 mg total) by mouth 2 (two) times daily., Disp: 270 tablet, Rfl: 1   Multiple Vitamins-Minerals (MULTIVITAMIN WITH MINERALS) tablet, Take 1 tablet by mouth in the morning and at bedtime., Disp: , Rfl:    potassium chloride SA (KLOR-CON M) 20 MEQ tablet, Take 1 tablet by mouth 2 times daily., Disp: 180 tablet, Rfl: 1   sulfaSALAzine (AZULFIDINE) 500 MG tablet, Take 2 tablets by mouth twice a day., Disp: 360 tablet, Rfl: 1   vitamin C (ASCORBIC ACID) 500 MG tablet, Take 500 mg by mouth in the morning., Disp: , Rfl:   Review of Symptoms: Pertinent positives as per HPI.  Physical Exam: Deferred given limitations of phone visit.  Laboratory &  Radiologic Studies: NM PET Image Initial (PI) Skull Base To Thigh (F-18 FDG) 12/30/2022  Narrative CLINICAL DATA:  Initial treatment strategy for cervical cancer.  EXAM: NUCLEAR MEDICINE PET SKULL BASE TO  THIGH  TECHNIQUE: 13.0 mCi F-18 FDG was injected intravenously. Full-ring PET imaging was performed from the skull base to thigh after the radiotracer. CT data was obtained and used for attenuation correction and anatomic localization.  Fasting blood glucose: 108 mg/dl  COMPARISON:  MR pelvis 12/18/2022 CT chest 10/10/2021 and CT abdomen pelvis 08/10/2019.  FINDINGS: Mediastinal blood pool activity: SUV max 3.1  Liver activity: SUV max NA  NECK:  Hypermetabolic left level 2 lymph nodes measure up to 5 mm (4/23), SUV 4.5. No additional abnormal hypermetabolism.  Incidental CT findings:  None.  CHEST:  Focal hypermetabolism in the medial right thyroid may correspond to a 12 mm low-attenuation nodule (4/34), SUV max 5.2. No additional abnormal hypermetabolism.  Incidental CT findings:  Atherosclerotic calcification of the aorta, aortic valve and coronary arteries. Left atrial appendage occlusion device. Enlarged pulmonic trunk and heart. No pericardial or pleural effusion.  ABDOMEN/PELVIS:  Intensely hypermetabolic cervical mass measures approximately 4.5 x 5.9 cm, SUV max 27.0. Hypermetabolic left external iliac lymph node measures 10 mm (4/129), SUV max 7.2. Hypermetabolic right common iliac lymph node measures 11 mm (4/119), SUV max 4.6. Mildly hypermetabolic portacaval lymph node is not enlarged, 4 mm, SUV max 3.9. No abnormal hypermetabolism in the liver, adrenal glands, spleen or pancreas.  Incidental CT findings:  Possible tiny left hepatic lobe cyst. Liver, gallbladder, adrenal glands, kidneys, spleen pancreas stomach and bowel are otherwise grossly unremarkable.  SKELETON:  No abnormal hypermetabolism.  Incidental CT findings:  Degenerative changes in the spine.  L5-S1 fusion.  IMPRESSION: 1. Hypermetabolic cervical mass with hypermetabolic metastatic bilateral iliac chain lymph nodes. 2. Mildly hypermetabolic unenlarged left level II cervical  and portacaval lymph nodes are nonspecific. Metastatic disease is difficult to definitively exclude. Recommend attention on follow-up. 3. Focal hypermetabolism associated with a right thyroid nodule. Patient recently underwent thyroid ultrasound 07/08/2022. Please refer to that report. 4. Aortic atherosclerosis (ICD10-I70.0). Coronary artery calcification. 5. Enlarged pulmonic trunk, indicative of pulmonary arterial hypertension.   Electronically Signed By: Leanna Battles M.D. On: 12/30/2022 15:29

## 2022-12-30 NOTE — Progress Notes (Signed)
Radiation Oncology         [phone] ________________________________  Initial Outpatient Consultation  Name: [name] MRN: [id number]  Date: 12/31/2022  DOB: 1951-07-23  WU:[id number], Vinetta Bergamo, MD  [name], MD   REFERRING PHYSICIAN: [name], MD  DIAGNOSIS: There were no encounter diagnoses.  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 female who is accompanied by ***. she is seen as a courtesy of Dr. Alvester Morin for an opinion concerning radiation therapy as part of management for her recently diagnosed cervical cancer.   The patient first reported a new onset of postmenopausal bleeding during a follow-up visit with cardiology on 11/04/22. 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 Per encounter notes, the patient somewhat attributed her vaginal bleeding to Eliquis. Given this concern, she was transitioned from Eliquis to Plavix.   A week later, she presented to the ED on 11/19/22 with c/o of heavy vaginal bleeding after passing some heavy clots that morning. Per encounter notes, the patient detailed having several months of vaginal spotting which had since become heavier. The on-call OB/GYN was contacted who did not recommend any further intervention beyond iron supplementation given her overall unremarkable work-up including a pelvic ultrasound which was limited/mostly non-diagnostic. With that being said, she was discharged home with instructions to proceed with OP care.   Accordingly, the patient met with Dr. Hyacinth Meeker at [facility] on  11/30/22 for further evaluation. GU exam performed during this visit was notable for a very friable and large fungating cervix with an exophytic appearance. 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 (Significant bleeding was also noted upon obtaining tissue sampling).   Subsequently, the patient was referred to Dr. Alvester Morin (Gyn-Onc). During her initial consultation with Dr. Alvester Morin on 12/09/22, the patient denied any further vaginal bleeding since her biopsy was performed. For treatment planning, Dr. Alvester Morin recommended first proceeding with a PET scan to rule out any metastatic disease and well as an MRI of the pelvis.   Staging work-up studies are detailed as follows:  -- MRI of the pelvis on 12/18/22 demonstrated: a 7.8 cm solid mass centered in the uterine cervix, causing hydrometra, and involving the upper 2/3 of the vagina and parametrial soft tissues bilaterally. No evidence of hydronephrosis or pelvic sidewall involvement were appreciated. MRI also showed mild bilateral iliac lymphadenopathy, suspicious for metastatic disease, and evidence of sigmoid diverticulosis without evidence of diverticulitis.  -- PET scan performed yesterday (12/30/22) demonstrated the cervical mass as intensely hypermetabolic measuring approximately 4.5 x 5.9 cm, and with an SUV max 27.0. PET also showed: hypermetabolic metastatic bilateral iliac chain lymph nodes, and nonspecific mildly hypermetabolic unenlarged left level II cervical and portacaval lymph nodes. (Other findings of potential clinical significance included a site of focal hypermetabolism associated with a right thyroid nodule, and an enlarged pulmonic trunk, indicative of pulmonary arterial hypertension.  The patient was seen in consultation by  Dr. Bertis Ruddy on 12/24/22. Systemic treatment  options discussed include cisplatin versus carboplatin. Although cisplatin would be favorable, due to her severe baseline peripheral neuropathy, Dr. Bertis Ruddy does not believe she will be a good candidate for cisplatin. Given that PET imaging was pending at the time of this visit, a final treatment decision could not be concluded. The patient will return to Dr. Bertis Ruddy in the near future to make a final decision regarding chemotherapy.   Her case was also presented at the Gyn-Onc tumor board held on 12/23/22. Disposition concluded is to definitive chemoradiation plus pembrolizumab depending on PD-L1 results.   Based on her locally advanced disease, PD-L1 testing (detailed below), tumor board consensus, and PET/MRI findings, Dr. Alvester Morin recommends proceeding with concurrent chemoradiation plus pembrolizumab which we will discuss in detail today.   Of note: PD-L1 testing showed a CPS score of 1%.    PREVIOUS RADIATION THERAPY: No  PAST MEDICAL HISTORY:  Past Medical History:  Diagnosis Date   Allergy    Cataract    BILATERAL-REMOVED 2 YEARS AGO   COVID 09/2021   Erythromelalgia (HCC)    followed by neuro Dr Allena Katz , mgd on gabapentin , dx several years ago    GERD (gastroesophageal reflux disease)    Helicobacter pylori gastritis 10/05/2018   Hx of colonic polyp - ssp 11/03/2014   Hypercalcemia    Hypertension    Left maxillary fracture (HCC) 07/17/2017   fell down my stairs 2 year ago , deneis any metal in place nor difficulty with jax extension    Neuropathy    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 Lalla Brothers   Pseudogout of foot    Rheumatoid arthritis(714.0) dx 2010   Shingles    hx of    Tibial plateau fracture, right, closed, initial  encounter 07/16/2017    PAST SURGICAL HISTORY: Past Surgical History:  Procedure Laterality Date   ATRIAL FIBRILLATION ABLATION N/A 04/16/2022   Procedure: ATRIAL FIBRILLATION ABLATION;  Surgeon: Lanier Prude, MD;  Location: [facility] INVASIVE CV LAB;  Service: Cardiovascular;  Laterality: N/A;   BREAST BIOPSY  1972   Broken wrist  2010   CARDIOVERSION N/A 11/22/2021   Procedure: CARDIOVERSION;  Surgeon: Sande Rives, MD;  Location: [facility] ENDOSCOPY;  Service: Cardiovascular;  Laterality: N/A;   CATARACT EXTRACTION Bilateral 03/2012   COLONOSCOPY     COSMETIC SURGERY     FRACTURE SURGERY     INNER EAR SURGERY     busted ear drum   LEFT ATRIAL APPENDAGE OCCLUSION N/A 10/03/2022   Procedure: LEFT ATRIAL APPENDAGE OCCLUSION;  Surgeon: Lanier Prude, MD;  Location: [facility] INVASIVE CV LAB;  Service: Cardiovascular;  Laterality: N/A;   MAXIMUM ACCESS (MAS)POSTERIOR LUMBAR INTERBODY FUSION (PLIF) 1 LEVEL N/A 10/11/2016   Procedure: Lumbar one-Sacral one Maximum access posterior lumbar interbody fusion;  Surgeon: Maeola Harman, MD;  Location: [facility] OR;  Service: Neurosurgery;  Laterality: N/A;   ORIF WRIST FRACTURE Right 07/18/2017   Procedure: OPEN REDUCTION INTERNAL FIXATION (ORIF) WRIST FRACTURE;  Surgeon: Sheral Apley, MD;  Location: [facility] OR;  Service: Orthopedics;  Laterality: Right;   PARATHYROIDECTOMY Left 04/12/2019   Procedure: LEFT SUPERIOR PARATHYROIDECTOMY;  Surgeon: Darnell Level, MD;  Location: [facility]  ORS;  Service: General;  Laterality: Left;   TEE WITHOUT CARDIOVERSION N/A 10/03/2022   Procedure: TRANSESOPHAGEAL ECHOCARDIOGRAM (TEE);  Surgeon: Lanier Prude, MD;  Location: [facility] INVASIVE CV LAB;  Service: Cardiovascular;  Laterality: N/A;    FAMILY HISTORY:  Family History  Problem Relation Age of Onset   Stroke Mother    Hypertension Father    Heart attack Father    Hypertension Sister        x 3   Hyperthyroidism Sister        x2, s/p RAI ablation   Liver cancer  Sister    Hypertension Brother    Hypothyroidism Brother    Colon cancer Neg Hx    Esophageal cancer Neg Hx    Rectal cancer Neg Hx    Stomach cancer Neg Hx    Breast cancer Neg Hx    Ovarian cancer Neg Hx    Endometrial cancer Neg Hx    Pancreatic cancer Neg Hx    Prostate cancer Neg Hx     SOCIAL HISTORY:  Social History   Tobacco Use   Smoking status: Former    Packs/day: 4.00    Years: 4.00    Additional pack years: 0.00    Total pack years: 16.00    Types: Cigarettes    Start date: 64    Quit date: 1985    Years since quitting: 39.3   Smokeless tobacco: Never  Vaping Use   Vaping Use: Never used  Substance Use Topics   Alcohol use: No    Alcohol/week: 0.0 standard drinks of alcohol   Drug use: No    ALLERGIES:  Allergies  Allergen Reactions   Amlodipine Besylate Other (See Comments)    Tremors   Divalproex Sodium Swelling and Other (See Comments)   Nortriptyline Nausea And Vomiting and Other (See Comments)    tremors   Hydromorphone Hcl Other (See Comments)    Headache, muscle tightness   Other     Band-aid (skin redness)   Rosuvastatin Other (See Comments)    Muscle aches, pains. Resolved with stopping medication.  Muscle aches / pains   Tramadol Other (See Comments)    Felt "stoned"    MEDICATIONS:  Current Outpatient Medications  Medication Sig Dispense Refill   acetaminophen (TYLENOL) 650 MG CR tablet Take 1,300 mg by mouth every 8 (eight) hours as needed for pain.     amoxicillin (AMOXIL) 500 MG capsule Take 4 capsules (2,000 mg total) by mouth as directed. 12 capsule 6   BIOTIN PO Take 1 tablet by mouth at bedtime.     Cholecalciferol (VITAMIN D) 125 MCG (5000 UT) CAPS Take 5,000 Units by mouth in the morning.     clopidogrel (PLAVIX) 75 MG tablet Take 1 tablet (75 mg total) by mouth daily. START ON 3/25 90 tablet 3   ferrous sulfate 325 (65 FE) MG tablet Take 1 tablet (325 mg total) by mouth daily. 100 tablet 0   fluticasone (FLONASE) 50  MCG/ACT nasal spray Place 1-2 sprays into both nostrils daily as needed for allergies or rhinitis.     furosemide (LASIX) 20 MG tablet Take 2 - 3 tablets (40 - 60 mg total) by mouth in the morning and at bedtime. 360 tablet 3   gabapentin (NEURONTIN) 100 MG capsule TAKE 3 CAPSULES IN THE MORNING AND 2 CAPSULES AT BEDTIME. OK TO TAKE EXTRA CAPSULE AT BEDTIME AS NEEDED 540 capsule 3   hydroxychloroquine (PLAQUENIL) 200 MG tablet Take 1 tablet (  200 mg total) by mouth 2 (two) times daily with food or milk 180 tablet 1   hydroxypropyl methylcellulose / hypromellose (ISOPTO TEARS / GONIOVISC) 2.5 % ophthalmic solution Place 1-2 drops into both eyes in the morning.     losartan (COZAAR) 100 MG tablet TAKE 1 TABLET BY MOUTH ONCE A DAY 90 tablet 1   Menthol, Topical Analgesic, (BIOFREEZE) 4 % GEL Apply 1 application  topically at bedtime. Neuropathy feet/ankles     metoprolol succinate (TOPROL-XL) 50 MG 24 hr tablet Take 1 tablet (50 mg total) by mouth 2 (two) times daily. 270 tablet 1   Multiple Vitamins-Minerals (MULTIVITAMIN WITH MINERALS) tablet Take 1 tablet by mouth in the morning and at bedtime.     potassium chloride SA (KLOR-CON M) 20 MEQ tablet Take 1 tablet by mouth 2 times daily. 180 tablet 1   sulfaSALAzine (AZULFIDINE) 500 MG tablet Take 2 tablets by mouth twice a day. 360 tablet 1   vitamin C (ASCORBIC ACID) 500 MG tablet Take 500 mg by mouth in the morning.     No current facility-administered medications for this encounter.    REVIEW OF SYSTEMS:  A 10+ POINT REVIEW OF SYSTEMS WAS OBTAINED including neurology, dermatology, psychiatry, cardiac, respiratory, lymph, extremities, GI, GU, musculoskeletal, constitutional, reproductive, HEENT. ***   PHYSICAL EXAM:  vitals were not taken for this visit.   General: Alert and oriented, in no acute distress HEENT: Head is normocephalic. Extraocular movements are intact. Oropharynx is clear. Neck: Neck is supple, no palpable cervical or  supraclavicular lymphadenopathy. Heart: Regular in rate and rhythm with no murmurs, rubs, or gallops. Chest: Clear to auscultation bilaterally, with no rhonchi, wheezes, or rales. Abdomen: Soft, nontender, nondistended, with no rigidity or guarding. Extremities: No cyanosis or edema. Lymphatics: see Neck Exam Skin: No concerning lesions. Musculoskeletal: symmetric strength and muscle tone throughout. Neurologic: Cranial nerves II through XII are grossly intact. No obvious focalities. Speech is fluent. Coordination is intact. Psychiatric: Judgment and insight are intact. Affect is appropriate.  On pelvic examination the external genitalia were unremarkable. A speculum exam was performed. There are no mucosal lesions noted in the vaginal vault. A Pap smear was obtained of the proximal vagina. On bimanual and rectovaginal examination there were no pelvic masses appreciated. ***   ECOG = ***  0 - Asymptomatic (Fully active, able to carry on all predisease activities without restriction)  1 - Symptomatic but completely ambulatory (Restricted in physically strenuous activity but ambulatory and able to carry out work of a light or sedentary nature. For example, light housework, office work)  2 - Symptomatic, <50% in bed during the day (Ambulatory and capable of all self care but unable to carry out any work activities. Up and about more than 50% of waking hours)  3 - Symptomatic, >50% in bed, but not bedbound (Capable of only limited self-care, confined to bed or chair 50% or more of waking hours)  4 - Bedbound (Completely disabled. Cannot carry on any self-care. Totally confined to bed or chair)  5 - Death   Santiago Glad MM, Creech RH, Tormey DC, et al. [phone]). "Toxicity and response criteria of the [facility]". Am. Evlyn Clines. Oncol. 5 (6): 649-55  LABORATORY DATA:  Lab Results  Component Value Date   WBC 5.2 11/19/2022   HGB 11.6 (L) 11/19/2022   HCT 35.0 (L) 11/19/2022    MCV 87.1 11/19/2022   PLT 222 11/19/2022   NEUTROABS 3.4 11/19/2022   Lab Results  Component  Value Date   NA 139 11/19/2022   K 3.8 11/19/2022   CL 100 11/19/2022   CO2 31 11/19/2022   GLUCOSE 126 (H) 11/19/2022   BUN 15 11/19/2022   CREATININE 1.05 (H) 11/19/2022   CALCIUM 9.7 11/19/2022      RADIOGRAPHY: NM PET Image Initial (PI) Skull Base To Thigh (F-18 FDG)  Result Date: 12/30/2022 CLINICAL DATA:  Initial treatment strategy for cervical cancer. EXAM: NUCLEAR MEDICINE PET SKULL BASE TO THIGH TECHNIQUE: 13.0 mCi F-18 FDG was injected intravenously. Full-ring PET imaging was performed from the skull base to thigh after the radiotracer. CT data was obtained and used for attenuation correction and anatomic localization. Fasting blood glucose: 108 mg/dl COMPARISON:  MR pelvis 12/18/2022 CT chest 10/10/2021 and CT abdomen pelvis 08/10/2019. FINDINGS: Mediastinal blood pool activity: SUV max 3.1 Liver activity: SUV max NA NECK: Hypermetabolic left level 2 lymph nodes measure up to 5 mm (4/23), SUV 4.5. No additional abnormal hypermetabolism. Incidental CT findings: None. CHEST: Focal hypermetabolism in the medial right thyroid may correspond to a 12 mm low-attenuation nodule (4/34), SUV max 5.2. No additional abnormal hypermetabolism. Incidental CT findings: Atherosclerotic calcification of the aorta, aortic valve and coronary arteries. Left atrial appendage occlusion device. Enlarged pulmonic trunk and heart. No pericardial or pleural effusion. ABDOMEN/PELVIS: Intensely hypermetabolic cervical mass measures approximately 4.5 x 5.9 cm, SUV max 27.0. Hypermetabolic left external iliac lymph node measures 10 mm (4/129), SUV max 7.2. Hypermetabolic right common iliac lymph node measures 11 mm (4/119), SUV max 4.6. Mildly hypermetabolic portacaval lymph node is not enlarged, 4 mm, SUV max 3.9. No abnormal hypermetabolism in the liver, adrenal glands, spleen or pancreas. Incidental CT findings: Possible  tiny left hepatic lobe cyst. Liver, gallbladder, adrenal glands, kidneys, spleen pancreas stomach and bowel are otherwise grossly unremarkable. SKELETON: No abnormal hypermetabolism. Incidental CT findings: Degenerative changes in the spine.  L5-S1 fusion. IMPRESSION: 1. Hypermetabolic cervical mass with hypermetabolic metastatic bilateral iliac chain lymph nodes. 2. Mildly hypermetabolic unenlarged left level II cervical and portacaval lymph nodes are nonspecific. Metastatic disease is difficult to definitively exclude. Recommend attention on follow-up. 3. Focal hypermetabolism associated with a right thyroid nodule. Patient recently underwent thyroid ultrasound 07/08/2022. Please refer to that report. 4. Aortic atherosclerosis (ICD10-I70.0). Coronary artery calcification. 5. Enlarged pulmonic trunk, indicative of pulmonary arterial hypertension. Electronically Signed   By: Leanna Battles M.D.   On: 12/30/2022 15:29   MR Pelvis W Wo Contrast  Result Date: 12/19/2022 CLINICAL DATA:  Cervical carcinoma. EXAM: MRI PELVIS WITHOUT AND WITH CONTRAST TECHNIQUE: Multiplanar multisequence MR imaging of the pelvis was performed both before and after administration of intravenous contrast. CONTRAST:  10mL GADAVIST GADOBUTROL 1 MMOL/ML IV SOLN COMPARISON:  None Available. FINDINGS: Lower Urinary Tract: No urinary bladder or urethral abnormality identified. Bowel: Sigmoid diverticulosis noted, without evidence of diverticulitis. Vascular/Lymphatic: 10 mm left iliac lymph node is seen at the iliac bifurcation on image 16/23. 11 mm right external iliac lymph node is seen on image 23/23. Other sub-cm bilateral iliac lymph nodes are seen, which are not considered pathologically enlarged. Reproductive: -- Uterus: A solid mass is seen which is centered in the cervix, which measures 7.8 x 5.0 x 5.8 cm. This mass also shows involvement of the lower uterine corpus causing hydrometros, and involvement of the upper 2/3 of the vagina.  There is also evidence of mild tumor extension into the parametrial soft tissues bilaterally, although there is no evidence of hydronephrosis or pelvic sidewall involvement. Tumor also abuts  the anterior wall of the upper rectum (e.g. Image 11/8), but there is no evidence of rectal mucosal involvement. -- Right ovary: Appears normal. No ovarian or adnexal masses identified. -- Left ovary: Appears normal. No ovarian or adnexal masses identified. Other: No peritoneal thickening or abnormal free fluid. Musculoskeletal:  Unremarkable. IMPRESSION: 7.8 cm solid mass centered in the uterine cervix, causing hydrometros. Tumor involves the upper 2/3 of the vagina and parametrial soft tissues bilaterally, without evidence of hydronephrosis or pelvic sidewall involvement. FIGO stage IIB Mild bilateral iliac lymphadenopathy, suspicious for metastatic disease. Sigmoid diverticulosis. No radiographic evidence of diverticulitis. Electronically Signed   By: Danae Orleans M.D.   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  We discussed the natural history of *** and general treatment, highlighting the role of radiotherapy in the management.  We discussed the available radiation techniques, and focused on the details of logistics and delivery.  We reviewed the anticipated acute and late sequelae associated with radiation in this setting.  The patient was encouraged to ask questions that I answered to the best of my ability. *** A patient consent form was discussed and signed.  We retained a copy for our records.  The patient would like to proceed with radiation and will be scheduled for CT simulation.  PLAN: ***    *** minutes of total time was spent for this patient encounter, including preparation, face-to-face counseling with the patient and coordination of care, physical exam, and  documentation of the encounter.   ------------------------------------------------  Billie Lade, PhD, MD  This document serves as a record of services personally performed by Antony Blackbird, MD. It was created on his behalf by Neena Rhymes, a trained medical scribe. The creation of this record is based on the scribe's personal observations and the provider's statements to them. This document has been checked and approved by the attending provider.

## 2022-12-31 ENCOUNTER — Ambulatory Visit
Admission: RE | Admit: 2022-12-31 | Discharge: 2022-12-31 | Disposition: A | Discharge status: Home / Self Care | Payer: Medicare Other | Source: Ambulatory Visit | Attending: Radiation Oncology | Admitting: Radiation Oncology

## 2022-12-31 ENCOUNTER — Telehealth: Payer: Self-pay | Admitting: Oncology

## 2022-12-31 ENCOUNTER — Other Ambulatory Visit (HOSPITAL_COMMUNITY): Payer: Self-pay

## 2022-12-31 ENCOUNTER — Encounter: Payer: Self-pay | Admitting: Radiation Oncology

## 2022-12-31 VITALS — BP 134/74 | HR 68 | Temp 97.4°F | Resp 18 | Ht 66.0 in | Wt 268.0 lb

## 2022-12-31 DIAGNOSIS — Z8616 Personal history of COVID-19: Secondary | ICD-10-CM | POA: Insufficient documentation

## 2022-12-31 DIAGNOSIS — I700 Atherosclerosis of aorta: Secondary | ICD-10-CM | POA: Diagnosis not present

## 2022-12-31 DIAGNOSIS — E041 Nontoxic single thyroid nodule: Secondary | ICD-10-CM | POA: Insufficient documentation

## 2022-12-31 DIAGNOSIS — C539 Malignant neoplasm of cervix uteri, unspecified: Secondary | ICD-10-CM | POA: Diagnosis present

## 2022-12-31 DIAGNOSIS — R590 Localized enlarged lymph nodes: Secondary | ICD-10-CM | POA: Diagnosis not present

## 2022-12-31 DIAGNOSIS — K5730 Diverticulosis of large intestine without perforation or abscess without bleeding: Secondary | ICD-10-CM | POA: Diagnosis not present

## 2022-12-31 DIAGNOSIS — Z800 Family history of malignant neoplasm of digestive organs: Secondary | ICD-10-CM | POA: Insufficient documentation

## 2022-12-31 DIAGNOSIS — G629 Polyneuropathy, unspecified: Secondary | ICD-10-CM | POA: Diagnosis not present

## 2022-12-31 DIAGNOSIS — K219 Gastro-esophageal reflux disease without esophagitis: Secondary | ICD-10-CM | POA: Diagnosis not present

## 2022-12-31 DIAGNOSIS — M810 Age-related osteoporosis without current pathological fracture: Secondary | ICD-10-CM | POA: Insufficient documentation

## 2022-12-31 DIAGNOSIS — C538 Malignant neoplasm of overlapping sites of cervix uteri: Secondary | ICD-10-CM

## 2022-12-31 DIAGNOSIS — Z87891 Personal history of nicotine dependence: Secondary | ICD-10-CM | POA: Insufficient documentation

## 2022-12-31 DIAGNOSIS — M069 Rheumatoid arthritis, unspecified: Secondary | ICD-10-CM | POA: Insufficient documentation

## 2022-12-31 DIAGNOSIS — Z79899 Other long term (current) drug therapy: Secondary | ICD-10-CM | POA: Diagnosis not present

## 2022-12-31 DIAGNOSIS — I10 Essential (primary) hypertension: Secondary | ICD-10-CM | POA: Insufficient documentation

## 2022-12-31 NOTE — Telephone Encounter (Signed)
Called [place] regarding her decision about chemotherapy.  She is still trying to decide and would like to talk with Dr. Roselind Messier today at her appointment.  Advised I will call her back this afternoon for her decision.

## 2022-12-31 NOTE — Telephone Encounter (Signed)
Called [place] regarding her treatment decision.  She said she is still thinking about it and has a couple of questions.  She is wondering how often the carboplatin infusions would be and if there are any other options for immunotherapy besides [place].  Advised that I will ask Dr. Bertis Ruddy and call her back tomorrow.

## 2023-01-01 NOTE — Telephone Encounter (Signed)
Called [place] and advised her of message below from Dr .Bertis Ruddy.  Scheduled appointment on 01/06/23 at 8:20.  She verbalized understanding and agreement.

## 2023-01-01 NOTE — Telephone Encounter (Signed)
HI,  Carboplatin is weekly for 5-6 weeks, 2 hours infusion No other immunotherapy is safe for her due to RA I can see her on Monday at 820 am for 30 mins before her CT sim to discuss further

## 2023-01-03 ENCOUNTER — Other Ambulatory Visit (HOSPITAL_COMMUNITY): Payer: Self-pay

## 2023-01-03 ENCOUNTER — Telehealth: Payer: Self-pay | Admitting: Neurology

## 2023-01-03 MED ORDER — GABAPENTIN 100 MG PO CAPS
ORAL_CAPSULE | ORAL | 0 refills | Status: DC
  Filled 2023-01-03: qty 540, 90d supply, fill #0

## 2023-01-03 NOTE — Telephone Encounter (Signed)
Called patient and informed her that we have sent her rx of Gabapentin to her pharmacy.Patient verbalized understanding and had no further questions or concerns.

## 2023-01-03 NOTE — Telephone Encounter (Signed)
Pt called in to get her gabapentin refilled. She was told she needed to schedule and appointment. She is scheduled for 01/08/23 for a virtual visit. She needs virtual due to her having cancer and not wanting to be around a lot of people.

## 2023-01-06 ENCOUNTER — Encounter: Payer: Self-pay | Admitting: Hematology and Oncology

## 2023-01-06 ENCOUNTER — Ambulatory Visit
Admission: RE | Admit: 2023-01-06 | Discharge: 2023-01-06 | Disposition: A | Discharge status: Home / Self Care | Payer: Medicare Other | Source: Ambulatory Visit | Attending: Radiation Oncology | Admitting: Radiation Oncology

## 2023-01-06 ENCOUNTER — Inpatient Hospital Stay (HOSPITAL_BASED_OUTPATIENT_CLINIC_OR_DEPARTMENT_OTHER): Payer: Medicare Other | Admitting: Hematology and Oncology

## 2023-01-06 VITALS — BP 143/65 | HR 86 | Temp 98.6°F | Resp 18 | Ht 66.0 in | Wt 267.0 lb

## 2023-01-06 DIAGNOSIS — C538 Malignant neoplasm of overlapping sites of cervix uteri: Secondary | ICD-10-CM

## 2023-01-06 DIAGNOSIS — C775 Secondary and unspecified malignant neoplasm of intrapelvic lymph nodes: Secondary | ICD-10-CM | POA: Diagnosis not present

## 2023-01-06 DIAGNOSIS — Z510 Encounter for antineoplastic radiation therapy: Secondary | ICD-10-CM | POA: Diagnosis not present

## 2023-01-06 DIAGNOSIS — C531 Malignant neoplasm of exocervix: Secondary | ICD-10-CM | POA: Insufficient documentation

## 2023-01-06 DIAGNOSIS — C539 Malignant neoplasm of cervix uteri, unspecified: Secondary | ICD-10-CM

## 2023-01-06 NOTE — Assessment & Plan Note (Signed)
I have reviewed recent PET/CT imaging findings We discussed her high risk disease situation We discussed why concurrent chemotherapy is generally recommended but due to her severe baseline peripheral neuropathy, I have expressed concern that she will not be able to tolerate cisplatin.  Alternatively, we can prescribe carboplatin but that is not going to be a benign treatment option either. Due to her high body mass index, the calculated dose of chemotherapy would be higher than most patients We discussed the risk, benefits, side effects of single agent carboplatin as radiosensitizing agent We also discussed recent PD-L1 testing.  She has very low PD-L1 CPS score of 1%.  Benefit of adding immunotherapy is low.  She has diagnosis of autoimmune disease with rheumatoid arthritis which is a contraindication to receive immunotherapy in general.  I explained to her why I would not be prescribing addition of pembrolizumab in this situation  After long discussion about the risk and benefits of treatment, the patient has made informed decision not to proceed with concurrent chemotherapy. She will continue follow-up with GYN surgeon and radiation oncologist She understood that without the addition of chemotherapy, she might be at high risk of cancer relapse down the road but she accepts the risks

## 2023-01-06 NOTE — Progress Notes (Signed)
[facility] OFFICE PROGRESS NOTE  Patient Care Team: [name], MD as PCP - General (Family Medicine) Lanier Prude, MD as PCP - Electrophysiology (Cardiology) Keturah Barre, MD (Otolaryngology) Teresa Coombs, MD (Ophthalmology) Donnetta Hail, MD as Consulting Physician (Rheumatology) Glendale Chard, DO as Consulting Physician (Neurology) Carlus Pavlov, MD as Consulting Physician (Internal Medicine)  ASSESSMENT & PLAN:  Cervical cancer [facility]) I have reviewed recent PET/CT imaging findings We discussed her high risk disease situation We discussed why concurrent chemotherapy is generally recommended but due to her severe baseline peripheral neuropathy, I have expressed concern that she will not be able to tolerate cisplatin.  Alternatively, we can prescribe carboplatin but that is not going to be a benign treatment option either. Due to her high body mass index, the calculated dose of chemotherapy would be higher than most patients We discussed the risk, benefits, side effects of single agent carboplatin as radiosensitizing agent We also discussed recent PD-L1 testing.  She has very low PD-L1 CPS score of 1%.  Benefit of adding immunotherapy is low.  She has diagnosis of autoimmune disease with rheumatoid arthritis which is a contraindication to receive immunotherapy in general.  I explained to her why I would not be prescribing addition of pembrolizumab in this situation  After long discussion about the risk and benefits of treatment, the patient has made informed decision not to proceed with concurrent chemotherapy. She will continue follow-up with GYN surgeon and radiation oncologist She understood that without the addition of chemotherapy, she might be at high risk of cancer relapse down the road but she accepts the risks  No orders of the defined types were placed in this encounter.   All questions were answered. The patient knows to call the  clinic with any problems, questions or concerns. The total time spent in the appointment was 30 minutes encounter with patients including review of chart and various tests results, discussions about plan of care and coordination of care plan   Artis Delay, MD 01/06/2023 8:45 AM  INTERVAL HISTORY: Please see below for problem oriented charting. she returns for further follow-up with her husband We reviewed treatment recommendations and focus our whole visit on the risk and benefits of addition of systemic chemotherapy  REVIEW OF SYSTEMS:  All other systems were reviewed with the patient and are negative.  I have reviewed the past medical history, past surgical history, social history and family history with the patient and they are unchanged from previous note.  ALLERGIES:  is allergic to amlodipine besylate, divalproex sodium, nortriptyline, hydromorphone hcl, other, rosuvastatin, and tramadol.  MEDICATIONS:  Current Outpatient Medications  Medication Sig Dispense Refill   acetaminophen (TYLENOL) 650 MG CR tablet Take 1,300 mg by mouth every 8 (eight) hours as needed for pain.     amoxicillin (AMOXIL) 500 MG capsule Take 4 capsules (2,000 mg total) by mouth as directed. 12 capsule 6   BIOTIN PO Take 1 tablet by mouth at bedtime.     Cholecalciferol (VITAMIN D) 125 MCG (5000 UT) CAPS Take 5,000 Units by mouth in the morning.     clopidogrel (PLAVIX) 75 MG tablet Take 1 tablet (75 mg total) by mouth daily. START ON 3/25 90 tablet 3   ferrous sulfate 325 (65 FE) MG tablet Take 1 tablet (325 mg total) by mouth daily. 100 tablet 0   fluticasone (FLONASE) 50 MCG/ACT nasal spray Place 1-2 sprays into both nostrils daily as needed for allergies or rhinitis.  furosemide (LASIX) 20 MG tablet Take 2 - 3 tablets (40 - 60 mg total) by mouth in the morning and at bedtime. 360 tablet 3   gabapentin (NEURONTIN) 100 MG capsule TAKE 3 CAPSULES BY MOUTH IN THE MORNING AND 2 CAPSULES AT BEDTIME. OK TO TAKE  EXTRA CAPSULE AT BEDTIME AS NEEDED 540 capsule 0   hydroxychloroquine (PLAQUENIL) 200 MG tablet Take 1 tablet (200 mg total) by mouth 2 (two) times daily with food or milk 180 tablet 1   hydroxypropyl methylcellulose / hypromellose (ISOPTO TEARS / GONIOVISC) 2.5 % ophthalmic solution Place 1-2 drops into both eyes in the morning.     losartan (COZAAR) 100 MG tablet TAKE 1 TABLET BY MOUTH ONCE A DAY 90 tablet 1   Menthol, Topical Analgesic, (BIOFREEZE) 4 % GEL Apply 1 application  topically at bedtime. Neuropathy feet/ankles     metoprolol succinate (TOPROL-XL) 50 MG 24 hr tablet Take 1 tablet (50 mg total) by mouth 2 (two) times daily. 270 tablet 1   Multiple Vitamins-Minerals (MULTIVITAMIN WITH MINERALS) tablet Take 1 tablet by mouth in the morning and at bedtime.     potassium chloride SA (KLOR-CON M) 20 MEQ tablet Take 1 tablet by mouth 2 times daily. 180 tablet 1   sulfaSALAzine (AZULFIDINE) 500 MG tablet Take 2 tablets by mouth twice a day. 360 tablet 1   vitamin C (ASCORBIC ACID) 500 MG tablet Take 500 mg by mouth in the morning.     No current facility-administered medications for this visit.    SUMMARY OF ONCOLOGIC HISTORY: Oncology History Overview Note  PD-L1 CPS 1%   Cervical cancer (HCC)  11/29/2022 Pathology Results   FINAL MICROSCOPIC DIAGNOSIS:   A. CERVIX, 10 O'CLOCK, BIOPSY:       Squamous cell carcinoma, at least in situ.       See comment.   COMMENT:   The specimen demonstrates multiple superficially sampled squamous epithelium with high-grade cytological dysplasia forming papillary architecture. There is no submucosal sampling which hinders the evaluation on invasion. Clinical correlation is recommended.     12/19/2022 Imaging   MRI pelvis 7.8 cm solid mass centered in the uterine cervix, causing hydrometros. Tumor involves the upper 2/3 of the vagina and parametrial soft tissues bilaterally, without evidence of hydronephrosis or pelvic sidewall involvement. FIGO  stage IIB   Mild bilateral iliac lymphadenopathy, suspicious for metastatic disease.   Sigmoid diverticulosis. No radiographic evidence of diverticulitis.   12/24/2022 Initial Diagnosis   Cervical cancer (HCC)   12/24/2022 Cancer Staging   Staging form: Cervix Uteri, AJCC Version 9 - Clinical stage from 12/24/2022: FIGO Stage IIIC2 (cT2b, cN2a, cM0) - Signed by Artis Delay, MD on 12/31/2022 Stage prefix: Initial diagnosis Para-aortic status: Positive   12/31/2022 PET scan   1. Hypermetabolic cervical mass with hypermetabolic metastatic bilateral iliac chain lymph nodes. 2. Mildly hypermetabolic unenlarged left level II cervical and portacaval lymph nodes are nonspecific. Metastatic disease is difficult to definitively exclude. Recommend attention on follow-up. 3. Focal hypermetabolism associated with a right thyroid nodule. Patient recently underwent thyroid ultrasound 07/08/2022. Please refer to that report. 4. Aortic atherosclerosis (ICD10-I70.0). Coronary artery calcification. 5. Enlarged pulmonic trunk, indicative of pulmonary arterial hypertension.       PHYSICAL EXAMINATION: ECOG PERFORMANCE STATUS: 1 - Symptomatic but completely ambulatory  Vitals:   01/06/23 0826  BP: (!) 143/65  Pulse: 86  Resp: 18  Temp: 98.6 F (37 C)  SpO2: 98%   Filed Weights   01/06/23 0826  Weight:  267 lb (121.1 kg)    GENERAL:alert, no distress and comfortable NEURO: alert & oriented x 3 with fluent speech, no focal motor/sensory deficits  LABORATORY DATA:  I have reviewed the data as listed    Component Value Date/Time   NA 139 11/19/2022 0915   NA 143 11/04/2022 1010   K 3.8 11/19/2022 0915   CL 100 11/19/2022 0915   CO2 31 11/19/2022 0915   GLUCOSE 126 (H) 11/19/2022 0915   BUN 15 11/19/2022 0915   BUN 15 11/04/2022 1010   CREATININE 1.05 (H) 11/19/2022 0915   CREATININE 1.59 (H) 08/17/2021 1531   CALCIUM 9.7 11/19/2022 0915   PROT 6.5 11/05/2021 1610   ALBUMIN 3.8 11/05/2021  1610   AST 13 11/05/2021 1610   ALT 13 11/05/2021 1610   ALKPHOS 55 11/05/2021 1610   BILITOT 0.3 11/05/2021 1610   GFRNONAA 56 (L) 11/19/2022 0915   GFRNONAA 57 (L) 07/30/2018 1117   GFRAA >60 04/07/2019 1553   GFRAA 67 07/30/2018 1117    No results found for: "SPEP", "UPEP"  Lab Results  Component Value Date   WBC 5.2 11/19/2022   NEUTROABS 3.4 11/19/2022   HGB 11.6 (L) 11/19/2022   HCT 35.0 (L) 11/19/2022   MCV 87.1 11/19/2022   PLT 222 11/19/2022      Chemistry      Component Value Date/Time   NA 139 11/19/2022 0915   NA 143 11/04/2022 1010   K 3.8 11/19/2022 0915   CL 100 11/19/2022 0915   CO2 31 11/19/2022 0915   BUN 15 11/19/2022 0915   BUN 15 11/04/2022 1010   CREATININE 1.05 (H) 11/19/2022 0915   CREATININE 1.59 (H) 08/17/2021 1531   GLU 96 04/21/2015 0000      Component Value Date/Time   CALCIUM 9.7 11/19/2022 0915   ALKPHOS 55 11/05/2021 1610   AST 13 11/05/2021 1610   ALT 13 11/05/2021 1610   BILITOT 0.3 11/05/2021 1610       RADIOGRAPHIC STUDIES: I have personally reviewed the radiological images as listed and agreed with the findings in the report. NM PET Image Initial (PI) Skull Base To Thigh (F-18 FDG)  Result Date: 12/30/2022 CLINICAL DATA:  Initial treatment strategy for cervical cancer. EXAM: NUCLEAR MEDICINE PET SKULL BASE TO THIGH TECHNIQUE: 13.0 mCi F-18 FDG was injected intravenously. Full-ring PET imaging was performed from the skull base to thigh after the radiotracer. CT data was obtained and used for attenuation correction and anatomic localization. Fasting blood glucose: 108 mg/dl COMPARISON:  MR pelvis 12/18/2022 CT chest 10/10/2021 and CT abdomen pelvis 08/10/2019. FINDINGS: Mediastinal blood pool activity: SUV max 3.1 Liver activity: SUV max NA NECK: Hypermetabolic left level 2 lymph nodes measure up to 5 mm (4/23), SUV 4.5. No additional abnormal hypermetabolism. Incidental CT findings: None. CHEST: Focal hypermetabolism in the medial  right thyroid may correspond to a 12 mm low-attenuation nodule (4/34), SUV max 5.2. No additional abnormal hypermetabolism. Incidental CT findings: Atherosclerotic calcification of the aorta, aortic valve and coronary arteries. Left atrial appendage occlusion device. Enlarged pulmonic trunk and heart. No pericardial or pleural effusion. ABDOMEN/PELVIS: Intensely hypermetabolic cervical mass measures approximately 4.5 x 5.9 cm, SUV max 27.0. Hypermetabolic left external iliac lymph node measures 10 mm (4/129), SUV max 7.2. Hypermetabolic right common iliac lymph node measures 11 mm (4/119), SUV max 4.6. Mildly hypermetabolic portacaval lymph node is not enlarged, 4 mm, SUV max 3.9. No abnormal hypermetabolism in the liver, adrenal glands, spleen or pancreas. Incidental CT  findings: Possible tiny left hepatic lobe cyst. Liver, gallbladder, adrenal glands, kidneys, spleen pancreas stomach and bowel are otherwise grossly unremarkable. SKELETON: No abnormal hypermetabolism. Incidental CT findings: Degenerative changes in the spine.  L5-S1 fusion. IMPRESSION: 1. Hypermetabolic cervical mass with hypermetabolic metastatic bilateral iliac chain lymph nodes. 2. Mildly hypermetabolic unenlarged left level II cervical and portacaval lymph nodes are nonspecific. Metastatic disease is difficult to definitively exclude. Recommend attention on follow-up. 3. Focal hypermetabolism associated with a right thyroid nodule. Patient recently underwent thyroid ultrasound 07/08/2022. Please refer to that report. 4. Aortic atherosclerosis (ICD10-I70.0). Coronary artery calcification. 5. Enlarged pulmonic trunk, indicative of pulmonary arterial hypertension. Electronically Signed   By: Leanna Battles M.D.   On: 12/30/2022 15:29   MR Pelvis W Wo Contrast  Result Date: 12/19/2022 CLINICAL DATA:  Cervical carcinoma. EXAM: MRI PELVIS WITHOUT AND WITH CONTRAST TECHNIQUE: Multiplanar multisequence MR imaging of the pelvis was performed both  before and after administration of intravenous contrast. CONTRAST:  10mL GADAVIST GADOBUTROL 1 MMOL/ML IV SOLN COMPARISON:  None Available. FINDINGS: Lower Urinary Tract: No urinary bladder or urethral abnormality identified. Bowel: Sigmoid diverticulosis noted, without evidence of diverticulitis. Vascular/Lymphatic: 10 mm left iliac lymph node is seen at the iliac bifurcation on image 16/23. 11 mm right external iliac lymph node is seen on image 23/23. Other sub-cm bilateral iliac lymph nodes are seen, which are not considered pathologically enlarged. Reproductive: -- Uterus: A solid mass is seen which is centered in the cervix, which measures 7.8 x 5.0 x 5.8 cm. This mass also shows involvement of the lower uterine corpus causing hydrometros, and involvement of the upper 2/3 of the vagina. There is also evidence of mild tumor extension into the parametrial soft tissues bilaterally, although there is no evidence of hydronephrosis or pelvic sidewall involvement. Tumor also abuts the anterior wall of the upper rectum (e.g. Image 11/8), but there is no evidence of rectal mucosal involvement. -- Right ovary: Appears normal. No ovarian or adnexal masses identified. -- Left ovary: Appears normal. No ovarian or adnexal masses identified. Other: No peritoneal thickening or abnormal free fluid. Musculoskeletal:  Unremarkable. IMPRESSION: 7.8 cm solid mass centered in the uterine cervix, causing hydrometros. Tumor involves the upper 2/3 of the vagina and parametrial soft tissues bilaterally, without evidence of hydronephrosis or pelvic sidewall involvement. FIGO stage IIB Mild bilateral iliac lymphadenopathy, suspicious for metastatic disease. Sigmoid diverticulosis. No radiographic evidence of diverticulitis. Electronically Signed   By: Danae Orleans M.D.   On: 12/19/2022 11:47

## 2023-01-08 ENCOUNTER — Encounter: Payer: Self-pay | Admitting: Neurology

## 2023-01-08 ENCOUNTER — Telehealth (INDEPENDENT_AMBULATORY_CARE_PROVIDER_SITE_OTHER): Payer: Medicare Other | Admitting: Neurology

## 2023-01-08 ENCOUNTER — Other Ambulatory Visit (HOSPITAL_COMMUNITY): Payer: Self-pay

## 2023-01-08 VITALS — Ht 66.0 in | Wt 260.0 lb

## 2023-01-08 DIAGNOSIS — R202 Paresthesia of skin: Secondary | ICD-10-CM

## 2023-01-08 DIAGNOSIS — M545 Low back pain, unspecified: Secondary | ICD-10-CM

## 2023-01-08 DIAGNOSIS — C775 Secondary and unspecified malignant neoplasm of intrapelvic lymph nodes: Secondary | ICD-10-CM | POA: Diagnosis not present

## 2023-01-08 DIAGNOSIS — I7381 Erythromelalgia: Secondary | ICD-10-CM

## 2023-01-08 DIAGNOSIS — Z510 Encounter for antineoplastic radiation therapy: Secondary | ICD-10-CM | POA: Diagnosis not present

## 2023-01-08 MED ORDER — GABAPENTIN 100 MG PO CAPS
ORAL_CAPSULE | ORAL | 3 refills | Status: DC
  Filled 2023-01-08: qty 540, fill #0
  Filled 2023-03-31: qty 540, 90d supply, fill #0
  Filled 2023-04-22 – 2023-07-01 (×2): qty 540, 90d supply, fill #1
  Filled 2023-09-30: qty 540, 90d supply, fill #2
  Filled 2023-12-31: qty 540, 90d supply, fill #3

## 2023-01-08 NOTE — Progress Notes (Signed)
   Virtual Visit via Video Note The purpose of this virtual visit is to provide medical care while limiting exposure to the novel coronavirus.    Consent was obtained for video visit:  Yes.   Answered questions that patient had about telehealth interaction:  Yes.   I discussed the limitations, risks, security and privacy concerns of performing an evaluation and management service by telemedicine. I also discussed with the patient that there may be a patient responsible charge related to this service. The patient expressed understanding and agreed to proceed.  Pt location: Home Physician Location: office Name of referring provider:  [name], MD I connected with [name] at patients initiation/request on 01/08/2023 at  8:50 AM EDT by video enabled telemedicine application and verified that I am speaking with the correct person using two identifiers. Pt MRN:  [id number] Pt DOB:  1951-02-05 Video Participants:  [name]   History of Present Illness: This is a 72 y.o. female returning for follow-up of erythromelalgia and new complaints of low back pain.  She remains on gabapentin 300mg  in the morning and 200mg  at bedtime which controls her pain most of the time.  Some days are good and others are worse.  She is having a lot of low back pain which is worse with walking.  Pain has been ongoing for the past year.  Pain is exacerbated by prolonged sitting and standing.   She was diagnosed with cervical cancer in April 2024 and will be getting radiation.  Due to her painful neuropathy, she was not deemed a candidate for chemotherapy.     Observations/Objective:   Vitals:   01/08/23 0830  Weight: 260 lb (117.9 kg)  Height: 5\' 6"  (1.676 m)   Patient is awake, alert, and appears comfortable.  Oriented x 4.   Extraocular muscles are intact. No ptosis.  Face is symmetric.  Antigravity in all extremities.   Assessment and Plan:  1.  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  Pain is controlled on most days. Previously treid:  Lyrica (swelling), nortriptyline (tremor), Cymbalta (swelling), depakote (swelling) Continue gabapentin 300mg  in the morning an 200mg  at bedtime.  OK to take an extra dose as needed  2.  Bilateral hand paresthesias, most suggestive of carpal tunnel syndrome.  Symptoms are less frequent and not bothersome Continue to monitor  3.  Low back pain concerning for neurogenic claudication MRI lumbar spine was declined, she may consider doing this at a later date   Follow Up Instructions:   I discussed the assessment and treatment plan with the patient. The patient was provided an opportunity to ask questions and all were answered. The patient agreed with the plan and demonstrated an understanding of the instructions.   The patient was advised to call back or seek an in-person evaluation if the symptoms worsen or if the condition fails to improve as anticipated.  Follow-up in 1 year or sooner as needed   Glendale Chard, DO

## 2023-01-09 ENCOUNTER — Telehealth: Payer: Self-pay | Admitting: Oncology

## 2023-01-09 ENCOUNTER — Telehealth: Payer: Self-pay | Admitting: Surgery

## 2023-01-09 NOTE — Telephone Encounter (Signed)
Pt called in stating she wants to know if anything can be done to lessen bleeding she is having. States she passes 4-5 clots/ day that are a little larger than quarter sized. Bleeding comes and goes and never soaks through pad. Is about the same as it was before. She starts treatment on Tuesday but wants to know if there is anything that can be done to lessen bleeding or if she has to deal with it? Denies other concerns at this time.

## 2023-01-09 NOTE — Telephone Encounter (Signed)
Called [name] regarding her vaginal bleeding.  Advised that I checked with Dr. Roselind Messier and he said she may be able to start radiation tomorrow but that it may not help the bleeding much with 1 treatment. If she starts on Tuesday, he said it should start resolving by the end of next week.  [name] verbalized agreement and said she wants to wait to start radiation until Tuesday, 01/14/23.  Also advised her to avoid heavy lifting and to take it easy this weekend.  Advised her to call if she needs anything.

## 2023-01-13 ENCOUNTER — Other Ambulatory Visit (HOSPITAL_COMMUNITY): Payer: Self-pay

## 2023-01-14 ENCOUNTER — Telehealth: Payer: Self-pay

## 2023-01-14 ENCOUNTER — Ambulatory Visit: Payer: Medicare Other | Admitting: Radiation Oncology

## 2023-01-14 ENCOUNTER — Ambulatory Visit
Admission: RE | Admit: 2023-01-14 | Discharge: 2023-01-14 | Disposition: A | Discharge status: Home / Self Care | Payer: Medicare Other | Source: Ambulatory Visit | Attending: Radiation Oncology | Admitting: Radiation Oncology

## 2023-01-14 ENCOUNTER — Other Ambulatory Visit: Payer: Self-pay

## 2023-01-14 ENCOUNTER — Other Ambulatory Visit (HOSPITAL_COMMUNITY): Payer: Self-pay

## 2023-01-14 DIAGNOSIS — C775 Secondary and unspecified malignant neoplasm of intrapelvic lymph nodes: Secondary | ICD-10-CM | POA: Diagnosis not present

## 2023-01-14 DIAGNOSIS — C538 Malignant neoplasm of overlapping sites of cervix uteri: Secondary | ICD-10-CM

## 2023-01-14 DIAGNOSIS — Z510 Encounter for antineoplastic radiation therapy: Secondary | ICD-10-CM | POA: Diagnosis not present

## 2023-01-14 LAB — RAD ONC ARIA SESSION SUMMARY
Course Elapsed Days: 0
Plan Fractions Treated to Date: 1
Plan Prescribed Dose Per Fraction: 1.8 Gy
Plan Total Fractions Prescribed: 25
Plan Total Prescribed Dose: 45 Gy
Reference Point Dosage Given to Date: 1.8 Gy
Reference Point Session Dosage Given: 1.8 Gy
Session Number: 1

## 2023-01-14 MED ORDER — PROCHLORPERAZINE MALEATE 10 MG PO TABS
10.0000 mg | ORAL_TABLET | Freq: Four times a day (QID) | ORAL | 0 refills | Status: DC | PRN
  Filled 2023-01-14: qty 30, 8d supply, fill #0

## 2023-01-14 NOTE — Addendum Note (Signed)
Encounter addended by: Antony Blackbird, MD on: 01/14/2023 1:12 PM  Actions taken: Pharmacy for encounter modified, Order list changed, Actions taken from a BestPractice Advisory

## 2023-01-14 NOTE — Addendum Note (Signed)
Encounter addended by: Sedonia Small, RN on: 01/14/2023 1:02 PM  Actions taken: Patient Education assessment filed

## 2023-01-14 NOTE — Telephone Encounter (Signed)
Called and spoke with patient to let her know that Dr. Roselind Messier had to change PRN nausea medication from ondansetron (due to safety alert stating risk for prolonged QT interval) to prochlorperazine. Informed her new prescription was sent to [facility]. Patient verbalized understanding and appreciation of call

## 2023-01-15 ENCOUNTER — Ambulatory Visit
Admission: RE | Admit: 2023-01-15 | Discharge: 2023-01-15 | Disposition: A | Discharge status: Home / Self Care | Payer: Medicare Other | Source: Ambulatory Visit | Attending: Radiation Oncology | Admitting: Radiation Oncology

## 2023-01-15 ENCOUNTER — Other Ambulatory Visit: Payer: Self-pay

## 2023-01-15 ENCOUNTER — Other Ambulatory Visit (HOSPITAL_COMMUNITY): Payer: Self-pay

## 2023-01-15 ENCOUNTER — Ambulatory Visit: Payer: Medicare Other

## 2023-01-15 DIAGNOSIS — R739 Hyperglycemia, unspecified: Secondary | ICD-10-CM | POA: Diagnosis not present

## 2023-01-15 DIAGNOSIS — C539 Malignant neoplasm of cervix uteri, unspecified: Secondary | ICD-10-CM | POA: Diagnosis present

## 2023-01-15 DIAGNOSIS — Z7902 Long term (current) use of antithrombotics/antiplatelets: Secondary | ICD-10-CM | POA: Diagnosis not present

## 2023-01-15 DIAGNOSIS — G629 Polyneuropathy, unspecified: Secondary | ICD-10-CM | POA: Diagnosis not present

## 2023-01-15 DIAGNOSIS — N1831 Chronic kidney disease, stage 3a: Secondary | ICD-10-CM | POA: Diagnosis not present

## 2023-01-15 DIAGNOSIS — D62 Acute posthemorrhagic anemia: Secondary | ICD-10-CM | POA: Diagnosis not present

## 2023-01-15 DIAGNOSIS — K219 Gastro-esophageal reflux disease without esophagitis: Secondary | ICD-10-CM | POA: Diagnosis not present

## 2023-01-15 DIAGNOSIS — Z87891 Personal history of nicotine dependence: Secondary | ICD-10-CM | POA: Diagnosis not present

## 2023-01-15 DIAGNOSIS — Z8249 Family history of ischemic heart disease and other diseases of the circulatory system: Secondary | ICD-10-CM | POA: Diagnosis not present

## 2023-01-15 DIAGNOSIS — Z8619 Personal history of other infectious and parasitic diseases: Secondary | ICD-10-CM | POA: Diagnosis not present

## 2023-01-15 DIAGNOSIS — D649 Anemia, unspecified: Secondary | ICD-10-CM | POA: Diagnosis not present

## 2023-01-15 DIAGNOSIS — Z6841 Body Mass Index (BMI) 40.0 and over, adult: Secondary | ICD-10-CM | POA: Diagnosis not present

## 2023-01-15 DIAGNOSIS — Z510 Encounter for antineoplastic radiation therapy: Secondary | ICD-10-CM | POA: Diagnosis not present

## 2023-01-15 DIAGNOSIS — M069 Rheumatoid arthritis, unspecified: Secondary | ICD-10-CM | POA: Diagnosis not present

## 2023-01-15 DIAGNOSIS — I7381 Erythromelalgia: Secondary | ICD-10-CM | POA: Diagnosis not present

## 2023-01-15 DIAGNOSIS — M810 Age-related osteoporosis without current pathological fracture: Secondary | ICD-10-CM | POA: Diagnosis not present

## 2023-01-15 DIAGNOSIS — I129 Hypertensive chronic kidney disease with stage 1 through stage 4 chronic kidney disease, or unspecified chronic kidney disease: Secondary | ICD-10-CM | POA: Diagnosis not present

## 2023-01-15 DIAGNOSIS — Z95818 Presence of other cardiac implants and grafts: Secondary | ICD-10-CM | POA: Diagnosis not present

## 2023-01-15 DIAGNOSIS — N939 Abnormal uterine and vaginal bleeding, unspecified: Secondary | ICD-10-CM | POA: Diagnosis present

## 2023-01-15 DIAGNOSIS — D696 Thrombocytopenia, unspecified: Secondary | ICD-10-CM | POA: Diagnosis not present

## 2023-01-15 DIAGNOSIS — D500 Iron deficiency anemia secondary to blood loss (chronic): Secondary | ICD-10-CM | POA: Diagnosis not present

## 2023-01-15 DIAGNOSIS — I4819 Other persistent atrial fibrillation: Secondary | ICD-10-CM | POA: Diagnosis not present

## 2023-01-15 DIAGNOSIS — E871 Hypo-osmolality and hyponatremia: Secondary | ICD-10-CM | POA: Diagnosis not present

## 2023-01-15 DIAGNOSIS — C538 Malignant neoplasm of overlapping sites of cervix uteri: Secondary | ICD-10-CM | POA: Diagnosis not present

## 2023-01-15 DIAGNOSIS — Z86010 Personal history of colonic polyps: Secondary | ICD-10-CM | POA: Diagnosis not present

## 2023-01-15 DIAGNOSIS — M0609 Rheumatoid arthritis without rheumatoid factor, multiple sites: Secondary | ICD-10-CM | POA: Diagnosis not present

## 2023-01-15 DIAGNOSIS — R599 Enlarged lymph nodes, unspecified: Secondary | ICD-10-CM | POA: Diagnosis not present

## 2023-01-15 DIAGNOSIS — Z8616 Personal history of COVID-19: Secondary | ICD-10-CM | POA: Diagnosis not present

## 2023-01-15 LAB — RAD ONC ARIA SESSION SUMMARY
Course Elapsed Days: 1
Plan Fractions Treated to Date: 2
Plan Prescribed Dose Per Fraction: 1.8 Gy
Plan Total Fractions Prescribed: 25
Plan Total Prescribed Dose: 45 Gy
Reference Point Dosage Given to Date: 3.6 Gy
Reference Point Session Dosage Given: 1.8 Gy
Session Number: 2

## 2023-01-16 ENCOUNTER — Other Ambulatory Visit (HOSPITAL_COMMUNITY): Payer: Self-pay

## 2023-01-16 ENCOUNTER — Other Ambulatory Visit: Payer: Self-pay

## 2023-01-16 ENCOUNTER — Ambulatory Visit
Admission: RE | Admit: 2023-01-16 | Discharge: 2023-01-16 | Disposition: A | Discharge status: Home / Self Care | Payer: Medicare Other | Source: Ambulatory Visit | Attending: Radiation Oncology | Admitting: Radiation Oncology

## 2023-01-16 ENCOUNTER — Ambulatory Visit: Payer: Medicare Other

## 2023-01-16 DIAGNOSIS — M0609 Rheumatoid arthritis without rheumatoid factor, multiple sites: Secondary | ICD-10-CM | POA: Diagnosis not present

## 2023-01-16 DIAGNOSIS — Z510 Encounter for antineoplastic radiation therapy: Secondary | ICD-10-CM | POA: Diagnosis not present

## 2023-01-16 LAB — RAD ONC ARIA SESSION SUMMARY
Course Elapsed Days: 2
Plan Fractions Treated to Date: 3
Plan Prescribed Dose Per Fraction: 1.8 Gy
Plan Total Fractions Prescribed: 25
Plan Total Prescribed Dose: 45 Gy
Reference Point Dosage Given to Date: 5.4 Gy
Reference Point Session Dosage Given: 1.8 Gy
Session Number: 3

## 2023-01-16 MED ORDER — SULFASALAZINE 500 MG PO TABS
1000.0000 mg | ORAL_TABLET | Freq: Two times a day (BID) | ORAL | 2 refills | Status: DC
  Filled 2023-01-16: qty 120, 30d supply, fill #0
  Filled 2023-02-18: qty 120, 30d supply, fill #1
  Filled 2023-02-27 – 2023-03-13 (×2): qty 120, 30d supply, fill #2

## 2023-01-16 NOTE — Telephone Encounter (Signed)
Entered in error

## 2023-01-17 ENCOUNTER — Encounter: Payer: Self-pay | Admitting: Radiology

## 2023-01-17 ENCOUNTER — Other Ambulatory Visit (HOSPITAL_COMMUNITY): Payer: Self-pay

## 2023-01-17 ENCOUNTER — Other Ambulatory Visit: Payer: Self-pay

## 2023-01-17 ENCOUNTER — Encounter: Payer: Self-pay | Admitting: Gynecologic Oncology

## 2023-01-17 ENCOUNTER — Ambulatory Visit
Admission: RE | Admit: 2023-01-17 | Discharge: 2023-01-17 | Disposition: A | Discharge status: Home / Self Care | Payer: Medicare Other | Source: Ambulatory Visit | Attending: Radiation Oncology | Admitting: Radiation Oncology

## 2023-01-17 ENCOUNTER — Inpatient Hospital Stay (HOSPITAL_BASED_OUTPATIENT_CLINIC_OR_DEPARTMENT_OTHER): Payer: Medicare Other | Admitting: Gynecologic Oncology

## 2023-01-17 ENCOUNTER — Telehealth: Payer: Self-pay

## 2023-01-17 ENCOUNTER — Ambulatory Visit: Payer: Medicare Other

## 2023-01-17 VITALS — BP 138/55 | HR 84 | Temp 97.7°F | Resp 18

## 2023-01-17 VITALS — BP 133/45 | HR 87 | Temp 98.5°F | Ht 66.14 in

## 2023-01-17 DIAGNOSIS — N939 Abnormal uterine and vaginal bleeding, unspecified: Secondary | ICD-10-CM

## 2023-01-17 DIAGNOSIS — C538 Malignant neoplasm of overlapping sites of cervix uteri: Secondary | ICD-10-CM

## 2023-01-17 DIAGNOSIS — D500 Iron deficiency anemia secondary to blood loss (chronic): Secondary | ICD-10-CM

## 2023-01-17 DIAGNOSIS — Z510 Encounter for antineoplastic radiation therapy: Secondary | ICD-10-CM | POA: Diagnosis not present

## 2023-01-17 LAB — CBC WITH DIFFERENTIAL (CANCER CENTER ONLY)
Abs Immature Granulocytes: 0.03 10*3/uL (ref 0.00–0.07)
Basophils Absolute: 0 10*3/uL (ref 0.0–0.1)
Basophils Relative: 1 %
Eosinophils Absolute: 0.1 10*3/uL (ref 0.0–0.5)
Eosinophils Relative: 2 %
HCT: 26.4 % — ABNORMAL LOW (ref 36.0–46.0)
Hemoglobin: 8.9 g/dL — ABNORMAL LOW (ref 12.0–15.0)
Immature Granulocytes: 0 %
Lymphocytes Relative: 20 %
Lymphs Abs: 1.6 10*3/uL (ref 0.7–4.0)
MCH: 29.4 pg (ref 26.0–34.0)
MCHC: 33.7 g/dL (ref 30.0–36.0)
MCV: 87.1 fL (ref 80.0–100.0)
Monocytes Absolute: 0.7 10*3/uL (ref 0.1–1.0)
Monocytes Relative: 9 %
Neutro Abs: 5.4 10*3/uL (ref 1.7–7.7)
Neutrophils Relative %: 68 %
Platelet Count: 217 10*3/uL (ref 150–400)
RBC: 3.03 MIL/uL — ABNORMAL LOW (ref 3.87–5.11)
RDW: 13.5 % (ref 11.5–15.5)
WBC Count: 7.9 10*3/uL (ref 4.0–10.5)
nRBC: 0 % (ref 0.0–0.2)

## 2023-01-17 LAB — RAD ONC ARIA SESSION SUMMARY
Course Elapsed Days: 3
Plan Fractions Treated to Date: 4
Plan Prescribed Dose Per Fraction: 1.8 Gy
Plan Total Fractions Prescribed: 25
Plan Total Prescribed Dose: 45 Gy
Reference Point Dosage Given to Date: 7.2 Gy
Reference Point Session Dosage Given: 1.8 Gy
Session Number: 4

## 2023-01-17 NOTE — Progress Notes (Addendum)
Patient completed 4th radiation treatment to cervix. Over to assist patient with dressing. Noted patient to have a copious amount of bright red blood with baseball sized clots. Patient denies dizziness or lightheadedness. Patient continues to have fatigue. Stat CBC diff obtained. Hemoglobin 8.9. Patient taken to Dr. Everitt Amber office for vaginal packing.   BP (!) 138/55   Pulse 84   Temp 97.7 F (36.5 C)   Resp 18   SpO2 99%

## 2023-01-17 NOTE — Patient Instructions (Addendum)
Monitor the bleeding. Please call after hours number at [phone] for heavy bleeding.   Continue with the plan for radiation tomorrow.  Symptoms to report to your health care team include heavy vaginal bleeding with clots, lightheadedness/dizziness, rectal bleeding, bloating, weight loss without effort, new and persistent pain, new and  persistent fatigue, new leg swelling, new masses (i.e., bumps in your neck or groin), new and persistent cough, new and persistent nausea and vomiting, change in bowel or bladder habits, and any other concerns.

## 2023-01-17 NOTE — Progress Notes (Signed)
[name] called before to her radiation treatment today complaining of increased fatigue and vaginal bleeding. I saw her before her treatment today. Her vital signs were within normal limits and she was in no acute distress. She stated that her vaginal bleeding had initially stopped since starting radiation, but experienced heavy bleeding for apporximately two hours yesterday. This resolved but then reoccurred briefly this morning. She also noted increased fatigue since yesterday. On review of systems, she denied any fevers, shortness of breath, cough, chest pain, or vomiting but mentioned nausea and looser stools since beginning radiation.  I discussed her case with Dr. Roselind Messier. He advised her to complete today's treatment and obtain a stat CBC with diff. Continuing her radiation treatments is the best way to manage her bleeding. If her labs are within normal limits, we will plan on continuing her treatment as planned. [name] expressed understanding of this plan and is ready to proceed with today's treatment. She knows to stop at the lab for her CBC after her treatment.    Joyice Faster, PA-C

## 2023-01-17 NOTE — Telephone Encounter (Signed)
Patient called in to report increased vaginal bleeding and severe fatigue. She is currently  on 3 of 25 treatments to cervix. Patient is asking if there is anything we can do? She had CBC and CMP done yesterday at rhematology office however office closed at 12 pm today so we are unable to see the results. Pls advise

## 2023-01-17 NOTE — Progress Notes (Signed)
Gynecologic Oncology Return Clinic Visit  01/17/23  Reason for Visit: vaginal bleeding  Treatment History: Oncology History Overview Note  PD-L1 CPS 1%   Cervical cancer (HCC)  11/29/2022 Pathology Results   FINAL MICROSCOPIC DIAGNOSIS:   A. CERVIX, 10 O'CLOCK, BIOPSY:       Squamous cell carcinoma, at least in situ.       See comment.   COMMENT:   The specimen demonstrates multiple superficially sampled squamous epithelium with high-grade cytological dysplasia forming papillary architecture. There is no submucosal sampling which hinders the evaluation on invasion. Clinical correlation is recommended.     12/19/2022 Imaging   MRI pelvis 7.8 cm solid mass centered in the uterine cervix, causing hydrometros. Tumor involves the upper 2/3 of the vagina and parametrial soft tissues bilaterally, without evidence of hydronephrosis or pelvic sidewall involvement. FIGO stage IIB   Mild bilateral iliac lymphadenopathy, suspicious for metastatic disease.   Sigmoid diverticulosis. No radiographic evidence of diverticulitis.   12/24/2022 Initial Diagnosis   Cervical cancer (HCC)   12/24/2022 Cancer Staging   Staging form: Cervix Uteri, AJCC Version 9 - Clinical stage from 12/24/2022: FIGO Stage IIIC2 (cT2b, cN2a, cM0) - Signed by Artis Delay, MD on 12/31/2022 Stage prefix: Initial diagnosis Para-aortic status: Positive   12/31/2022 PET scan   1. Hypermetabolic cervical mass with hypermetabolic metastatic bilateral iliac chain lymph nodes. 2. Mildly hypermetabolic unenlarged left level II cervical and portacaval lymph nodes are nonspecific. Metastatic disease is difficult to definitively exclude. Recommend attention on follow-up. 3. Focal hypermetabolism associated with a right thyroid nodule. Patient recently underwent thyroid ultrasound 07/08/2022. Please refer to that report. 4. Aortic atherosclerosis (ICD10-I70.0). Coronary artery calcification. 5. Enlarged pulmonic trunk, indicative of  pulmonary arterial hypertension.       Interval History: Patient came for radiation today and was noted to have heavy vaginal bleeding with passage of clots.  She notes that bleeding has been intermittent.  Will stop altogether and then restart with heavy bleeding.  Her bleeding had stopped as of this morning but then she began having heavy bleeding after arriving at [facility].  She has some mild dizziness when she changes positions, otherwise denies any dizziness or lightheadedness.  Past Medical/Surgical History: Past Medical History:  Diagnosis Date   Allergy    Cataract    BILATERAL-REMOVED 2 YEARS AGO   COVID 09/2021   Erythromelalgia (HCC)    followed by neuro Dr Allena Katz , mgd on gabapentin , dx several years ago    GERD (gastroesophageal reflux disease)    Helicobacter pylori gastritis 10/05/2018   Hx of colonic polyp - ssp 11/03/2014   Hypercalcemia    Hypertension    Left maxillary fracture (HCC) 07/17/2017   fell down my stairs 2 year ago , deneis any metal in place nor difficulty with jax extension    Neuropathy    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 Lalla Brothers   Pseudogout of foot    Rheumatoid arthritis(714.0) dx 2010   Shingles    hx of    Tibial plateau fracture, right, closed, initial encounter 07/16/2017    Past Surgical History:  Procedure Laterality Date   ATRIAL FIBRILLATION ABLATION N/A 04/16/2022   Procedure: ATRIAL FIBRILLATION ABLATION;  Surgeon: Lanier Prude, MD;  Location: [facility] INVASIVE CV LAB;  Service: Cardiovascular;  Laterality: N/A;   BREAST BIOPSY  1972   Broken wrist  2010   CARDIOVERSION N/A 11/22/2021   Procedure: CARDIOVERSION;  Surgeon: Sande Rives, MD;  Location: [facility] ENDOSCOPY;   Service: Cardiovascular;  Laterality: N/A;   CATARACT EXTRACTION Bilateral 03/2012   COLONOSCOPY     COSMETIC SURGERY     FRACTURE SURGERY     INNER EAR SURGERY     busted ear drum   LEFT ATRIAL APPENDAGE OCCLUSION N/A 10/03/2022   Procedure: LEFT ATRIAL APPENDAGE OCCLUSION;  Surgeon: Lanier Prude, MD;  Location: [facility] INVASIVE CV LAB;  Service: Cardiovascular;  Laterality: N/A;   MAXIMUM ACCESS (MAS)POSTERIOR LUMBAR INTERBODY FUSION (PLIF) 1 LEVEL N/A 10/11/2016   Procedure: Lumbar one-Sacral one Maximum access posterior lumbar interbody fusion;  Surgeon: Maeola Harman, MD;  Location: [facility] OR;  Service: Neurosurgery;  Laterality: N/A;   ORIF WRIST FRACTURE Right 07/18/2017   Procedure: OPEN REDUCTION INTERNAL FIXATION (ORIF) WRIST FRACTURE;  Surgeon: Sheral Apley, MD;  Location: [facility] OR;  Service: Orthopedics;  Laterality: Right;   PARATHYROIDECTOMY Left 04/12/2019   Procedure: LEFT SUPERIOR PARATHYROIDECTOMY;  Surgeon: Darnell Level, MD;  Location: [facility] ORS;  Service: General;  Laterality: Left;   TEE WITHOUT CARDIOVERSION N/A 10/03/2022   Procedure: TRANSESOPHAGEAL ECHOCARDIOGRAM (TEE);  Surgeon: Lanier Prude, MD;  Location: [facility] INVASIVE CV LAB;  Service: Cardiovascular;  Laterality: N/A;    Family History  Problem Relation Age of Onset   Stroke Mother    Hypertension Father    Heart attack Father    Hypertension Sister        x 3   Hyperthyroidism Sister        x2, s/p RAI ablation   Liver cancer Sister    Hypertension Brother    Hypothyroidism Brother    Colon cancer Neg Hx    Esophageal cancer Neg Hx    Rectal cancer Neg Hx    Stomach cancer Neg Hx    Breast cancer Neg Hx    Ovarian cancer Neg Hx    Endometrial cancer Neg Hx    Pancreatic cancer Neg Hx    Prostate cancer Neg Hx     Social History   Socioeconomic History   Marital status: Married    Spouse name: Not on file   Number of children: 3   Years of education: Not on file   Highest education level:  11th grade  Occupational History   Occupation: Retired  Tobacco Use   Smoking status: Former    Packs/day: 4.00    Years: 4.00    Additional pack years: 0.00    Total pack years: 16.00    Types: Cigarettes    Start date: 37    Quit date: 1985    Years since quitting: 39.4   Smokeless tobacco: Never  Vaping Use   Vaping Use: Never used  Substance and Sexual Activity   Alcohol use: No    Alcohol/week: 0.0 standard drinks of alcohol   Drug use: No   Sexual activity: Not Currently  Other Topics Concern   Not on file  Social History Narrative   Artist -retired Designer, fashion/clothing   Married, lives with spouse, Dannielle Huh, he is IT support for [employer]   3 sons   2 caffeinated beverages a day   No regular exercise, diet is ok   Right handed   Social Determinants  of Health   Financial Resource Strain: Low Risk  (07/18/2022)   Overall Financial Resource Strain (CARDIA)    Difficulty of Paying Living Expenses: Not hard at all  Food Insecurity: No Food Insecurity (12/31/2022)   Hunger Vital Sign    Worried About Running Out of Food in the Last Year: Never true    Ran Out of Food in the Last Year: Never true  Transportation Needs: No Transportation Needs (07/18/2022)   PRAPARE - Administrator, Civil Service (Medical): No    Lack of Transportation (Non-Medical): No  Physical Activity: Inactive (07/18/2022)   Exercise Vital Sign    Days of Exercise per Week: 0 days    Minutes of Exercise per Session: 0 min  Stress: No Stress Concern Present (07/18/2022)   [employer] of Occupational Health - Occupational Stress Questionnaire    Feeling of Stress : Not at all  Social Connections: Socially Integrated (07/18/2022)   Social Connection and Isolation Panel [NHANES]    Frequency of Communication with Friends and Family: More than three times a week    Frequency of Social Gatherings with Friends and Family: More than three times a week    Attends  Religious Services: More than 4 times per year    Active Member of [employer] or Organizations: Yes    Attends Engineer, structural: More than 4 times per year    Marital Status: Married    Current Medications:  Current Outpatient Medications:    acetaminophen (TYLENOL) 650 MG CR tablet, Take 1,300 mg by mouth every 8 (eight) hours as needed for pain., Disp: , Rfl:    amoxicillin (AMOXIL) 500 MG capsule, Take 4 capsules (2,000 mg total) by mouth as directed., Disp: 12 capsule, Rfl: 6   BIOTIN PO, Take 1 tablet by mouth at bedtime., Disp: , Rfl:    Cholecalciferol (VITAMIN D) 125 MCG (5000 UT) CAPS, Take 5,000 Units by mouth in the morning., Disp: , Rfl:    clopidogrel (PLAVIX) 75 MG tablet, Take 1 tablet (75 mg total) by mouth daily. START ON 3/25, Disp: 90 tablet, Rfl: 3   ferrous sulfate 325 (65 FE) MG tablet, Take 1 tablet (325 mg total) by mouth daily., Disp: 100 tablet, Rfl: 0   fluticasone (FLONASE) 50 MCG/ACT nasal spray, Place 1-2 sprays into both nostrils daily as needed for allergies or rhinitis., Disp: , Rfl:    furosemide (LASIX) 20 MG tablet, Take 2 - 3 tablets (40 - 60 mg total) by mouth in the morning and at bedtime., Disp: 360 tablet, Rfl: 3   gabapentin (NEURONTIN) 100 MG capsule, TAKE 3 CAPSULES BY MOUTH IN THE MORNING AND 2 CAPSULES AT BEDTIME. OK TO TAKE EXTRA CAPSULE AT BEDTIME AS NEEDED, Disp: 540 capsule, Rfl: 3   hydroxychloroquine (PLAQUENIL) 200 MG tablet, Take 1 tablet (200 mg total) by mouth 2 (two) times daily with food or milk, Disp: 180 tablet, Rfl: 1   hydroxypropyl methylcellulose / hypromellose (ISOPTO TEARS / GONIOVISC) 2.5 % ophthalmic solution, Place 1-2 drops into both eyes in the morning., Disp: , Rfl:    losartan (COZAAR) 100 MG tablet, TAKE 1 TABLET BY MOUTH ONCE A DAY, Disp: 90 tablet, Rfl: 1   Menthol, Topical Analgesic, (BIOFREEZE) 4 % GEL, Apply 1 application  topically at bedtime. Neuropathy feet/ankles, Disp: , Rfl:    metoprolol succinate  (TOPROL-XL) 50 MG 24 hr tablet, Take 1 tablet (50 mg total) by mouth 2 (two) times daily., Disp: 270 tablet, Rfl:  1   Multiple Vitamins-Minerals (MULTIVITAMIN WITH MINERALS) tablet, Take 1 tablet by mouth in the morning and at bedtime., Disp: , Rfl:    potassium chloride SA (KLOR-CON M) 20 MEQ tablet, Take 1 tablet by mouth 2 times daily., Disp: 180 tablet, Rfl: 1   prochlorperazine (COMPAZINE) 10 MG tablet, Take 1 tablet (10 mg) by mouth every 6 hours as needed for nausea or vomiting., Disp: 30 tablet, Rfl: 0   sulfaSALAzine (AZULFIDINE) 500 MG tablet, Take 2 tablets by mouth twice a day., Disp: 360 tablet, Rfl: 1   sulfaSALAzine (AZULFIDINE) 500 MG tablet, Take 2 tablets (1,000 mg total) by mouth 2 (two) times daily., Disp: 120 tablet, Rfl: 2   vitamin C (ASCORBIC ACID) 500 MG tablet, Take 500 mg by mouth in the morning., Disp: , Rfl:   Physical Exam: BP (!) 133/45 (BP Location: Right Wrist, Patient Position: Sitting)   Pulse 87   Temp 98.5 F (36.9 C) (Oral)   Ht 5' 6.14" (1.68 m)   SpO2 99%   BMI 41.79 kg/m  General: Alert, oriented, no acute distress. HEENT: Normocephalic, atraumatic, sclera anicteric. Chest: Unlabored breathing on room air.  GU: Normal appearing external genitalia without erythema, excoriation, or lesions.  Speculum exam reveals friable tumor replacing the cervix, some older appearing blood within the vagina although no active bleeding noted.  Blood was removed from the vagina and the vagina was packed with quick clot gauze.  This was left in place for an hour.  Gauze was then removed after an hour (no blood on the guaze) and patient was asked to walk around for approximately 10-15 minutes.  Minimal blood was noted on her pad.  Laboratory & Radiologic Studies:    Latest Ref Rng & Units 01/17/2023    2:39 PM 11/19/2022    9:15 AM 09/16/2022   11:14 AM  CBC  WBC 4.0 - 10.5 K/uL 7.9  5.2  8.1   Hemoglobin 12.0 - 15.0 g/dL 8.9  16.1  09.6   Hematocrit 36.0 - 46.0 % 26.4   35.0  41.7   Platelets 150 - 400 K/uL 217  222  229     Assessment & Plan: ELLIET FALLA is a 72 y.o. woman with Stage IIIC1 cervical cancer who presents for heavy vaginal bleeding and acute on chronic anemia.  On exam, patient has no active bleeding but evidence of prior bleeding today.  And friable appearing tumor, vagina was packed with quick clot gauze for an hour.  When gauze was removed, no blood was noted on the gauze and patient did not have bleeding subsequently.  Hemoglobin was 9 today.  She has some mild dizziness with position changes.  Precautions reviewed for over the weekend.  She will come again tomorrow for radiation.  18 minutes of total time was spent for this patient encounter, including preparation, face-to-face counseling with the patient and coordination of care, and documentation of the encounter.  Eugene Garnet, MD  Division of Gynecologic Oncology  Department of Obstetrics and Gynecology  [facility]

## 2023-01-18 ENCOUNTER — Other Ambulatory Visit: Payer: Self-pay

## 2023-01-18 ENCOUNTER — Inpatient Hospital Stay (HOSPITAL_BASED_OUTPATIENT_CLINIC_OR_DEPARTMENT_OTHER)
Admission: EM | Admit: 2023-01-18 | Discharge: 2023-01-20 | DRG: 760 | Disposition: A | Discharge status: Home / Self Care | Payer: Medicare Other | Attending: Internal Medicine | Admitting: Internal Medicine

## 2023-01-18 ENCOUNTER — Ambulatory Visit
Admission: RE | Admit: 2023-01-18 | Discharge: 2023-01-18 | Disposition: A | Discharge status: Home / Self Care | Payer: Medicare Other | Source: Ambulatory Visit | Attending: Radiation Oncology | Admitting: Radiation Oncology

## 2023-01-18 ENCOUNTER — Encounter (HOSPITAL_BASED_OUTPATIENT_CLINIC_OR_DEPARTMENT_OTHER): Payer: Self-pay

## 2023-01-18 DIAGNOSIS — D649 Anemia, unspecified: Secondary | ICD-10-CM | POA: Diagnosis not present

## 2023-01-18 DIAGNOSIS — Z95818 Presence of other cardiac implants and grafts: Secondary | ICD-10-CM

## 2023-01-18 DIAGNOSIS — R599 Enlarged lymph nodes, unspecified: Secondary | ICD-10-CM | POA: Diagnosis present

## 2023-01-18 DIAGNOSIS — Z8249 Family history of ischemic heart disease and other diseases of the circulatory system: Secondary | ICD-10-CM

## 2023-01-18 DIAGNOSIS — D62 Acute posthemorrhagic anemia: Secondary | ICD-10-CM | POA: Diagnosis present

## 2023-01-18 DIAGNOSIS — Z510 Encounter for antineoplastic radiation therapy: Secondary | ICD-10-CM | POA: Diagnosis not present

## 2023-01-18 DIAGNOSIS — Z79899 Other long term (current) drug therapy: Secondary | ICD-10-CM

## 2023-01-18 DIAGNOSIS — R739 Hyperglycemia, unspecified: Secondary | ICD-10-CM | POA: Diagnosis present

## 2023-01-18 DIAGNOSIS — M069 Rheumatoid arthritis, unspecified: Secondary | ICD-10-CM | POA: Diagnosis present

## 2023-01-18 DIAGNOSIS — I7381 Erythromelalgia: Secondary | ICD-10-CM | POA: Diagnosis present

## 2023-01-18 DIAGNOSIS — Z8616 Personal history of COVID-19: Secondary | ICD-10-CM

## 2023-01-18 DIAGNOSIS — Z7902 Long term (current) use of antithrombotics/antiplatelets: Secondary | ICD-10-CM

## 2023-01-18 DIAGNOSIS — C538 Malignant neoplasm of overlapping sites of cervix uteri: Secondary | ICD-10-CM | POA: Insufficient documentation

## 2023-01-18 DIAGNOSIS — Z888 Allergy status to other drugs, medicaments and biological substances status: Secondary | ICD-10-CM

## 2023-01-18 DIAGNOSIS — D696 Thrombocytopenia, unspecified: Secondary | ICD-10-CM | POA: Diagnosis present

## 2023-01-18 DIAGNOSIS — Z9841 Cataract extraction status, right eye: Secondary | ICD-10-CM

## 2023-01-18 DIAGNOSIS — Z87891 Personal history of nicotine dependence: Secondary | ICD-10-CM | POA: Diagnosis not present

## 2023-01-18 DIAGNOSIS — M810 Age-related osteoporosis without current pathological fracture: Secondary | ICD-10-CM | POA: Diagnosis present

## 2023-01-18 DIAGNOSIS — Z86010 Personal history of colonic polyps: Secondary | ICD-10-CM

## 2023-01-18 DIAGNOSIS — Z6841 Body Mass Index (BMI) 40.0 and over, adult: Secondary | ICD-10-CM

## 2023-01-18 DIAGNOSIS — I129 Hypertensive chronic kidney disease with stage 1 through stage 4 chronic kidney disease, or unspecified chronic kidney disease: Secondary | ICD-10-CM | POA: Diagnosis present

## 2023-01-18 DIAGNOSIS — N939 Abnormal uterine and vaginal bleeding, unspecified: Secondary | ICD-10-CM | POA: Diagnosis present

## 2023-01-18 DIAGNOSIS — I4819 Other persistent atrial fibrillation: Secondary | ICD-10-CM | POA: Diagnosis present

## 2023-01-18 DIAGNOSIS — Z91048 Other nonmedicinal substance allergy status: Secondary | ICD-10-CM

## 2023-01-18 DIAGNOSIS — N1831 Chronic kidney disease, stage 3a: Secondary | ICD-10-CM | POA: Diagnosis present

## 2023-01-18 DIAGNOSIS — E871 Hypo-osmolality and hyponatremia: Secondary | ICD-10-CM | POA: Diagnosis present

## 2023-01-18 DIAGNOSIS — C539 Malignant neoplasm of cervix uteri, unspecified: Secondary | ICD-10-CM | POA: Diagnosis present

## 2023-01-18 DIAGNOSIS — Z981 Arthrodesis status: Secondary | ICD-10-CM

## 2023-01-18 DIAGNOSIS — K219 Gastro-esophageal reflux disease without esophagitis: Secondary | ICD-10-CM | POA: Diagnosis present

## 2023-01-18 DIAGNOSIS — Z8619 Personal history of other infectious and parasitic diseases: Secondary | ICD-10-CM | POA: Diagnosis not present

## 2023-01-18 DIAGNOSIS — R141 Gas pain: Secondary | ICD-10-CM

## 2023-01-18 DIAGNOSIS — G629 Polyneuropathy, unspecified: Secondary | ICD-10-CM | POA: Diagnosis present

## 2023-01-18 DIAGNOSIS — Z9842 Cataract extraction status, left eye: Secondary | ICD-10-CM

## 2023-01-18 LAB — RAD ONC ARIA SESSION SUMMARY
Course Elapsed Days: 4
Plan Fractions Treated to Date: 5
Plan Prescribed Dose Per Fraction: 1.8 Gy
Plan Total Fractions Prescribed: 25
Plan Total Prescribed Dose: 45 Gy
Reference Point Dosage Given to Date: 9 Gy
Reference Point Session Dosage Given: 1.8 Gy
Session Number: 5

## 2023-01-18 LAB — BASIC METABOLIC PANEL
Anion gap: 9 (ref 5–15)
BUN: 12 mg/dL (ref 8–23)
CO2: 29 mmol/L (ref 22–32)
Calcium: 8.7 mg/dL — ABNORMAL LOW (ref 8.9–10.3)
Chloride: 99 mmol/L (ref 98–111)
Creatinine, Ser: 1.03 mg/dL — ABNORMAL HIGH (ref 0.44–1.00)
GFR, Estimated: 57 mL/min — ABNORMAL LOW (ref >60)
Glucose, Bld: 152 mg/dL — ABNORMAL HIGH (ref 70–99)
Potassium: 3.8 mmol/L (ref 3.5–5.1)
Sodium: 137 mmol/L (ref 135–145)

## 2023-01-18 LAB — CBC
HCT: 23.5 % — ABNORMAL LOW (ref 36.0–46.0)
Hemoglobin: 7.9 g/dL — ABNORMAL LOW (ref 12.0–15.0)
MCH: 29.2 pg (ref 26.0–34.0)
MCHC: 33.6 g/dL (ref 30.0–36.0)
MCV: 86.7 fL (ref 80.0–100.0)
Platelets: 212 10*3/uL (ref 150–400)
RBC: 2.71 MIL/uL — ABNORMAL LOW (ref 3.87–5.11)
RDW: 13.7 % (ref 11.5–15.5)
WBC: 5.6 10*3/uL (ref 4.0–10.5)
nRBC: 0 % (ref 0.0–0.2)

## 2023-01-18 MED ORDER — POLYETHYLENE GLYCOL 3350 17 G PO PACK: 17.0000 g | PACK | Freq: Every day | ORAL | Status: DC | PRN

## 2023-01-18 MED ORDER — OXYCODONE HCL 5 MG PO TABS: 5.0000 mg | ORAL_TABLET | Freq: Four times a day (QID) | ORAL | Status: DC | PRN

## 2023-01-18 MED ORDER — HYPROMELLOSE (GONIOSCOPIC) 2.5 % OP SOLN: 1.0000 [drp] | Freq: Every morning | OPHTHALMIC | Status: DC

## 2023-01-18 MED ORDER — GABAPENTIN 100 MG PO CAPS
200.0000 mg | ORAL_CAPSULE | Freq: Two times a day (BID) | ORAL | Status: DC
  Administered 2023-01-19 – 2023-01-20 (×4): 200 mg via ORAL
  Filled 2023-01-18 (×4): qty 2

## 2023-01-18 MED ORDER — MENTHOL (TOPICAL ANALGESIC) 4 % EX GEL: 1.0000 "application " | Freq: Every day | CUTANEOUS | Status: DC

## 2023-01-18 MED ORDER — HYDROXYCHLOROQUINE SULFATE 200 MG PO TABS
200.0000 mg | ORAL_TABLET | Freq: Two times a day (BID) | ORAL | Status: DC
  Administered 2023-01-19 – 2023-01-20 (×5): 200 mg via ORAL
  Filled 2023-01-18 (×5): qty 1

## 2023-01-18 MED ORDER — MUSCLE RUB 10-15 % EX CREA
TOPICAL_CREAM | Freq: Every day | CUTANEOUS | Status: DC
  Filled 2023-01-18: qty 85

## 2023-01-18 MED ORDER — MELATONIN 5 MG PO TABS: 5.0000 mg | ORAL_TABLET | Freq: Every evening | ORAL | Status: DC | PRN

## 2023-01-18 MED ORDER — SULFASALAZINE 500 MG PO TABS
1000.0000 mg | ORAL_TABLET | Freq: Two times a day (BID) | ORAL | Status: DC
  Administered 2023-01-19 – 2023-01-20 (×4): 1000 mg via ORAL
  Filled 2023-01-18 (×5): qty 2

## 2023-01-18 MED ORDER — FLUTICASONE PROPIONATE 50 MCG/ACT NA SUSP: 1.0000 | Freq: Every day | NASAL | Status: DC | PRN

## 2023-01-18 MED ORDER — VITAMIN D3 25 MCG (1000 UNIT) PO TABS
5000.0000 [IU] | ORAL_TABLET | Freq: Every day | ORAL | Status: DC
  Administered 2023-01-19 – 2023-01-20 (×2): 5000 [IU] via ORAL
  Filled 2023-01-18 (×2): qty 5

## 2023-01-18 MED ORDER — ACETAMINOPHEN 325 MG PO TABS
650.0000 mg | ORAL_TABLET | Freq: Four times a day (QID) | ORAL | Status: DC | PRN
  Administered 2023-01-19 – 2023-01-20 (×5): 650 mg via ORAL
  Filled 2023-01-18 (×5): qty 2

## 2023-01-18 MED ORDER — VITAMIN C 500 MG PO TABS
500.0000 mg | ORAL_TABLET | ORAL | Status: DC
  Administered 2023-01-19: 500 mg via ORAL
  Filled 2023-01-18: qty 1

## 2023-01-18 MED ORDER — PROCHLORPERAZINE EDISYLATE 10 MG/2ML IJ SOLN
5.0000 mg | Freq: Four times a day (QID) | INTRAMUSCULAR | Status: DC | PRN
  Administered 2023-01-18: 5 mg via INTRAVENOUS
  Filled 2023-01-18: qty 2

## 2023-01-18 MED ORDER — ADULT MULTIVITAMIN W/MINERALS CH
1.0000 | ORAL_TABLET | Freq: Every day | ORAL | Status: DC
  Administered 2023-01-19 – 2023-01-20 (×2): 1 via ORAL
  Filled 2023-01-18 (×2): qty 1

## 2023-01-18 NOTE — Progress Notes (Signed)
[facility] was notified of pt's arrival to unit. They informed us that Dr.  Margo Aye will be seeing the pt tonight.

## 2023-01-18 NOTE — ED Notes (Signed)
Kylie at [facility] will send transport when available. Bed Ready at [facility] Room# 1614.-ABB(NS)

## 2023-01-18 NOTE — ED Triage Notes (Signed)
Arrives with complaints of vaginal bleeding. Patient is being treated for cervical cancer and had heavy bleeding after her treatment yesterday. The bleeding resolved after packing at the site. She had another cancer treatment today and the bleeding started again.   Reports no pain

## 2023-01-18 NOTE — H&P (Incomplete)
History and Physical  [name]:[id number] DOB: [birth year] DOA: 01/18/2023  Referring physician: Admitted by Dr. Mikeal Hawthorne [facility], hospitalist service. PCP: [name], MD  Outpatient Specialists: Radiation oncology, gynecology. Patient coming from: Home through [facility] ED.  Chief Complaint: Vaginal bleeding.  HPI: [name] is a 72 y.o. female with medical history significant for cervical cancer with ongoing radiation treatment, recurrent heavy vaginal bleeding with passage of clots, GERD, CKD 3A, history of H. pylori gastritis, rheumatoid arthritis on sulfasalazine paroxysmal A-fib not on oral anticoagulation, obesity, who presented to [facility] ED as a transfer from [facility] ED due to vaginal bleed in the setting of cervical cancer treatment with radiation.    In the ED, lab studies were notable for acute blood loss anemia with hemoglobin of 7.9 from 8.9 yesterday.  Baseline hemoglobin 14 in January 2024.  Endorses generalized fatigue with vaginal bleeding.  1 unit PRBCs ordered to be transfused by EDP.  Transfusion will take place at [facility].  EDP discussed the case with gynecology oncology Dr. Pricilla Holm who recommended to transfuse as indicated with close monitoring of H&H and admission.  Gynecology oncology will see in consultation.  The patient was admitted by Dr. Mikeal Hawthorne, [facility], and transferred to [facility] MedSurg unit as inpatient status.  ED Course: Temperature 98.3.  BP 143/64, pulse 92, respiratory 18, O2 saturation 99% on room air.  Lab studies notable for hemoglobin 7.9, MCV 86.7.  WBC 5.6, platelet count 212.  Serum glucose 152, creatinine 1.03 with GFR 57.  Review of Systems: Review of systems as noted in the HPI. All other systems reviewed and are negative.   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 Lalla Brothers   Pseudogout of foot    Rheumatoid arthritis(714.0) dx 2010   Shingles    hx of    Tibial plateau fracture, right, closed, initial encounter 07/16/2017   Past Surgical History:  Procedure Laterality Date   ATRIAL FIBRILLATION ABLATION N/A 04/16/2022   Procedure: ATRIAL FIBRILLATION ABLATION;  Surgeon: Lanier Prude, MD;  Location: [facility] INVASIVE CV LAB;  Service: Cardiovascular;  Laterality: N/A;   BREAST BIOPSY  1972   Broken wrist  2010   CARDIOVERSION N/A 11/22/2021   Procedure: CARDIOVERSION;  Surgeon: Sande Rives, MD;  Location: [facility] ENDOSCOPY;  Service: Cardiovascular;  Laterality: N/A;   CATARACT EXTRACTION Bilateral 03/2012   COLONOSCOPY     COSMETIC SURGERY     FRACTURE SURGERY     INNER EAR SURGERY     busted ear drum   LEFT ATRIAL APPENDAGE  OCCLUSION N/A 10/03/2022   Procedure: LEFT ATRIAL APPENDAGE OCCLUSION;  Surgeon: Lanier Prude, MD;  Location: [facility] INVASIVE CV LAB;  Service: Cardiovascular;  Laterality: N/A;   MAXIMUM ACCESS (MAS)POSTERIOR LUMBAR INTERBODY FUSION (PLIF) 1 LEVEL N/A 10/11/2016   Procedure: Lumbar one-Sacral one Maximum access posterior lumbar interbody fusion;  Surgeon: Maeola Harman, MD;  Location: [facility] OR;  Service: Neurosurgery;  Laterality: N/A;   ORIF WRIST FRACTURE Right 07/18/2017   Procedure: OPEN  REDUCTION INTERNAL FIXATION (ORIF) WRIST FRACTURE;  Surgeon: Sheral Apley, MD;  Location: [facility] OR;  Service: Orthopedics;  Laterality: Right;   PARATHYROIDECTOMY Left 04/12/2019   Procedure: LEFT SUPERIOR PARATHYROIDECTOMY;  Surgeon: Darnell Level, MD;  Location: [facility] ORS;  Service: General;  Laterality: Left;   TEE WITHOUT CARDIOVERSION N/A 10/03/2022   Procedure: TRANSESOPHAGEAL ECHOCARDIOGRAM (TEE);  Surgeon: Lanier Prude, MD;  Location: [facility] INVASIVE CV LAB;  Service: Cardiovascular;  Laterality: N/A;    Social History:  reports that she quit smoking about 39 years ago. Her smoking use included cigarettes. She started smoking about 43 years ago. She has a 16.00 pack-year smoking history. She has never used smokeless tobacco. She reports that she does not drink alcohol and does not use drugs.   Allergies  Allergen Reactions   Amlodipine Besylate Other (See Comments)    Tremors   Divalproex Sodium Swelling and Other (See Comments)   Nortriptyline Nausea And Vomiting and Other (See Comments)    tremors   Hydromorphone Hcl Other (See Comments)    Headache, muscle tightness   Other     Band-aid (skin redness)   Rosuvastatin Other (See Comments)    Muscle aches, pains. Resolved with stopping medication.  Muscle aches / pains   Tramadol Other (See Comments)    Felt "stoned"    Family History  Problem Relation Age of Onset   Stroke Mother    Hypertension Father    Heart attack Father    Hypertension Sister        x 3   Hyperthyroidism Sister        x2, s/p RAI ablation   Liver cancer Sister    Hypertension Brother    Hypothyroidism Brother    Colon cancer Neg Hx    Esophageal cancer Neg Hx    Rectal cancer Neg Hx    Stomach cancer Neg Hx    Breast cancer Neg Hx    Ovarian cancer Neg Hx    Endometrial cancer Neg Hx    Pancreatic cancer Neg Hx    Prostate cancer Neg Hx       Prior to Admission medications   Medication Sig Start Date End Date Taking? Authorizing  Provider  acetaminophen (TYLENOL) 650 MG CR tablet Take 1,300 mg by mouth every 8 (eight) hours as needed for pain.    [provider]  amoxicillin (AMOXIL) 500 MG capsule Take 4 capsules (2,000 mg total) by mouth as directed. 11/04/22   Georgie Chard D, NP  BIOTIN PO Take 1 tablet by mouth at bedtime.    [provider]  Cholecalciferol (VITAMIN D) 125 MCG (5000 UT) CAPS Take 5,000 Units by mouth in the morning.    [provider]  clopidogrel (PLAVIX) 75 MG tablet Take 1 tablet (75 mg total) by mouth daily. START ON 3/25 11/04/22   Georgie Chard D, NP  ferrous sulfate 325 (65 FE) MG tablet Take 1 tablet (325 mg total) by mouth daily. 11/19/22   Ernie Avena, MD  fluticasone (  FLONASE) 50 MCG/ACT nasal spray Place 1-2 sprays into both nostrils daily as needed for allergies or rhinitis.    [provider]  furosemide (LASIX) 20 MG tablet Take 2 - 3 tablets (40 - 60 mg total) by mouth in the morning and at bedtime. 10/21/22 10/21/23  [name], MD  gabapentin (NEURONTIN) 100 MG capsule TAKE 3 CAPSULES BY MOUTH IN THE MORNING AND 2 CAPSULES AT BEDTIME. OK TO TAKE EXTRA CAPSULE AT BEDTIME AS NEEDED 01/08/23   Nita Sickle K, DO  hydroxychloroquine (PLAQUENIL) 200 MG tablet Take 1 tablet (200 mg total) by mouth 2 (two) times daily with food or milk 10/01/22     hydroxypropyl methylcellulose / hypromellose (ISOPTO TEARS / GONIOVISC) 2.5 % ophthalmic solution Place 1-2 drops into both eyes in the morning.    [provider]  losartan (COZAAR) 100 MG tablet TAKE 1 TABLET BY MOUTH ONCE A DAY 08/02/22 08/02/23  Nafziger, Kandee Keen, NP  Menthol, Topical Analgesic, (BIOFREEZE) 4 % GEL Apply 1 application  topically at bedtime. Neuropathy feet/ankles    [provider]  metoprolol succinate (TOPROL-XL) 50 MG 24 hr tablet Take 1 tablet (50 mg total) by mouth 2 (two) times daily. 10/21/22   [name], MD  Multiple Vitamins-Minerals (MULTIVITAMIN WITH  MINERALS) tablet Take 1 tablet by mouth in the morning and at bedtime.    [provider]  potassium chloride SA (KLOR-CON M) 20 MEQ tablet Take 1 tablet by mouth 2 times daily. 08/27/22   [name], MD  prochlorperazine (COMPAZINE) 10 MG tablet Take 1 tablet (10 mg) by mouth every 6 hours as needed for nausea or vomiting. 01/14/23   Antony Blackbird, MD  sulfaSALAzine (AZULFIDINE) 500 MG tablet Take 2 tablets by mouth twice a day. 07/15/22     sulfaSALAzine (AZULFIDINE) 500 MG tablet Take 2 tablets (1,000 mg total) by mouth 2 (two) times daily. 01/16/23     vitamin C (ASCORBIC ACID) 500 MG tablet Take 500 mg by mouth in the morning.    [provider]    Physical Exam: BP (!) 143/64 (BP Location: Right Arm)   Pulse 92   Temp 98.3 F (36.8 C) (Oral)   Resp 18   Ht 5\' 6"  (1.676 m)   Wt 117.9 kg   SpO2 99%   BMI 41.95 kg/m   General: 72 y.o. year-old female well developed well nourished in no acute distress.  Alert and oriented x3. Cardiovascular: Regular rate and rhythm with no rubs or gallops.  No thyromegaly or JVD noted.  No lower extremity edema. 2/4 pulses in all 4 extremities. Respiratory: Clear to auscultation with no wheezes or rales. Good inspiratory effort. Abdomen: Soft nontender nondistended with normal bowel sounds x4 quadrants. Muskuloskeletal: No cyanosis, clubbing or edema noted bilaterally Neuro: CN II-XII intact, strength, sensation, reflexes Skin: No ulcerative lesions noted or rashes Psychiatry: Judgement and insight appear normal. Mood is appropriate for condition and setting          Labs on Admission:  Basic Metabolic Panel: Recent Labs  Lab 01/18/23 1521  NA 137  K 3.8  CL 99  CO2 29  GLUCOSE 152*  BUN 12  CREATININE 1.03*  CALCIUM 8.7*   Liver Function Tests: No results for input(s): "AST", "ALT", "ALKPHOS", "BILITOT", "PROT", "ALBUMIN" in the last 168 hours. No results for input(s): "LIPASE", "AMYLASE" in the last 168  hours. No results for input(s): "AMMONIA" in the last 168 hours. CBC: Recent Labs  Lab  01/17/23 1439 01/18/23 1521  WBC 7.9 5.6  NEUTROABS 5.4  --   HGB 8.9* 7.9*  HCT 26.4* 23.5*  MCV 87.1 86.7  PLT 217 212   Cardiac Enzymes: No results for input(s): "CKTOTAL", "CKMB", "CKMBINDEX", "TROPONINI" in the last 168 hours.  BNP (last 3 results) No results for input(s): "BNP" in the last 8760 hours.  ProBNP (last 3 results) No results for input(s): "PROBNP" in the last 8760 hours.  CBG: No results for input(s): "GLUCAP" in the last 168 hours.  Radiological Exams on Admission: No results found.  EKG: I independently viewed the EKG done and my findings are as followed: None available at the time of this visit.  Assessment/Plan Present on Admission:  Vaginal bleeding  Principal Problem:   Vaginal bleeding  Vaginal bleeding in the setting of cervical cancer with ongoing radiation Endorses vaginal bleeding yesterday after cervical cancer radiation, stopped and then recurred today after radiation. Transfuse as indicated Maintain MAP greater than 65 Hold off antiplatelets and anticoagulation Rest of management per gynecology oncology.  Acute blood loss anemia in the setting of above Symptomatic anemia in the setting of above Presented with hemoglobin of 7.9 from hemoglobin of 8.9 yesterday, baseline hemoglobin 14 on January 2024 1 unit PRBC ordered to be transfused by EDP Obtain CBC post blood transfusion Maintain hemoglobin above 8 in the setting of ongoing bleeding.  Cervical cancer with ongoing radiation Management per gynecology oncology who will see in consultation.  Rheumatoid arthritis on sulfasalazine Resume home regimen  Obesity BMI 41 Recommend regular physical activity and healthy dieting.  CKD 3 A Appears to be at her baseline creatinine 1.03 with GFR 57 Avoid nephrotoxic agents, dehydration and hypotension Monitor urine output repeat BMP in the  morning  Hyperglycemia Presented with serum glucose of 152 Obtain hemoglobin A1c Start insulin sliding scale.    DVT prophylaxis: SCDs  Code Status: Full code  Family Communication: None at bedside.  Disposition Plan: Admitted to MedSurg unit  Consults called: Radiation oncology  Admission status: Inpatient status.   Status is: Inpatient The patient requires at least 2 midnights for further evaluation and treatment of present condition.   Darlin Drop MD [facility] Pager [phone]  If 7PM-7AM, please contact night-coverage [email or web address] Password [facility]  01/18/2023, 9:55 PM

## 2023-01-18 NOTE — ED Notes (Signed)
Pt able to ambulate to the restroom without assistance. Denies dizziness, sitting in chair next to spouse.Provided warm blankets for comfort, no other needs at this time.

## 2023-01-18 NOTE — Progress Notes (Signed)
Blood consent signed and placed on chart

## 2023-01-18 NOTE — ED Provider Notes (Signed)
[place] EMERGENCY DEPARTMENT AT [facility] Provider Note   CSN: [id number] Arrival date & time: 01/18/23  1410     History  Chief Complaint  Patient presents with   Vaginal Bleeding    DAE URIOSTE is a 72 y.o. female.   Vaginal Bleeding  Patient is a 72 year old female with a past medical history significant for atrial fibrillation no longer on anticoagulation but does take Plavix, hypertension, neuropathy, arthritis  Patient follows with Dr. Pricilla Holm of gynecology/oncology  She is currently receiving cervical radiation therapy for cervical cancer.  She states she has had several treatments over the past week it seems that she is doing nearly daily radiation therapy.  She had bleeding yesterday after her radiation therapy and had packing placed in the vaginal vault which was then removed and she has not had bleeding again until today after her radiation therapy she states that she started bleeding around 12 noon and blood until arrival to the emergency department.  She states she does not feel that she is bleeding actively.  She states she feels generally more fatigued and weak than usual.  Denies any nausea vomiting or abdominal pain.  She denies any fevers or chills no urinary frequency urgency dysuria or hematuria.  No chest pain or difficulty breathing.  She primarily describes her symptoms as overall just general weakness.     Home Medications Prior to Admission medications   Medication Sig Start Date End Date Taking? Authorizing Provider  acetaminophen (TYLENOL) 650 MG CR tablet Take 1,300 mg by mouth every 8 (eight) hours as needed for pain.    [provider]  amoxicillin (AMOXIL) 500 MG capsule Take 4 capsules (2,000 mg total) by mouth as directed. 11/04/22   Georgie Chard D, NP  BIOTIN PO Take 1 tablet by mouth at bedtime.    [provider]  Cholecalciferol (VITAMIN D) 125 MCG (5000 UT) CAPS Take 5,000 Units by mouth in the morning.     [provider]  clopidogrel (PLAVIX) 75 MG tablet Take 1 tablet (75 mg total) by mouth daily. START ON 3/25 11/04/22   Georgie Chard D, NP  ferrous sulfate 325 (65 FE) MG tablet Take 1 tablet (325 mg total) by mouth daily. 11/19/22   Ernie Avena, MD  fluticasone (FLONASE) 50 MCG/ACT nasal spray Place 1-2 sprays into both nostrils daily as needed for allergies or rhinitis.    [provider]  furosemide (LASIX) 20 MG tablet Take 2 - 3 tablets (40 - 60 mg total) by mouth in the morning and at bedtime. 10/21/22 10/21/23  [name], MD  gabapentin (NEURONTIN) 100 MG capsule TAKE 3 CAPSULES BY MOUTH IN THE MORNING AND 2 CAPSULES AT BEDTIME. OK TO TAKE EXTRA CAPSULE AT BEDTIME AS NEEDED 01/08/23   Nita Sickle K, DO  hydroxychloroquine (PLAQUENIL) 200 MG tablet Take 1 tablet (200 mg total) by mouth 2 (two) times daily with food or milk 10/01/22     hydroxypropyl methylcellulose / hypromellose (ISOPTO TEARS / GONIOVISC) 2.5 % ophthalmic solution Place 1-2 drops into both eyes in the morning.    [provider]  losartan (COZAAR) 100 MG tablet TAKE 1 TABLET BY MOUTH ONCE A DAY 08/02/22 08/02/23  Nafziger, Kandee Keen, NP  Menthol, Topical Analgesic, (BIOFREEZE) 4 % GEL Apply 1 application  topically at bedtime. Neuropathy feet/ankles    [provider]  metoprolol succinate (TOPROL-XL) 50 MG 24 hr tablet Take 1 tablet (50 mg total) by mouth 2 (two) times  daily. 10/21/22   [name], MD  Multiple Vitamins-Minerals (MULTIVITAMIN WITH MINERALS) tablet Take 1 tablet by mouth in the morning and at bedtime.    [provider]  potassium chloride SA (KLOR-CON M) 20 MEQ tablet Take 1 tablet by mouth 2 times daily. 08/27/22   [name], MD  prochlorperazine (COMPAZINE) 10 MG tablet Take 1 tablet (10 mg) by mouth every 6 hours as needed for nausea or vomiting. 01/14/23   Antony Blackbird, MD  sulfaSALAzine (AZULFIDINE) 500 MG tablet Take 2 tablets by mouth twice a  day. 07/15/22     sulfaSALAzine (AZULFIDINE) 500 MG tablet Take 2 tablets (1,000 mg total) by mouth 2 (two) times daily. 01/16/23     vitamin C (ASCORBIC ACID) 500 MG tablet Take 500 mg by mouth in the morning.    [provider]      Allergies    Amlodipine besylate, Divalproex sodium, Nortriptyline, Hydromorphone hcl, Other, Rosuvastatin, and Tramadol    Review of Systems   Review of Systems  Genitourinary:  Positive for vaginal bleeding.    Physical Exam Updated Vital Signs BP (!) 144/71   Pulse 81   Temp 98.3 F (36.8 C)   Resp 14   Ht 5\' 6"  (1.676 m)   Wt 117.9 kg   SpO2 100%   BMI 41.95 kg/m  Physical Exam Vitals and nursing note reviewed.  Constitutional:      General: She is not in acute distress. HENT:     Head: Normocephalic and atraumatic.     Nose: Nose normal.     Mouth/Throat:     Mouth: Mucous membranes are moist.  Eyes:     General: No scleral icterus.    Comments: Pale conjunctiva  Cardiovascular:     Rate and Rhythm: Normal rate and regular rhythm.     Pulses: Normal pulses.     Heart sounds: Normal heart sounds.  Pulmonary:     Effort: Pulmonary effort is normal. No respiratory distress.     Breath sounds: No wheezing.  Abdominal:     Palpations: Abdomen is soft.     Tenderness: There is no abdominal tenderness.  Musculoskeletal:     Cervical back: Normal range of motion.     Right lower leg: No edema.     Left lower leg: No edema.  Skin:    General: Skin is warm and dry.     Capillary Refill: Capillary refill takes less than 2 seconds.  Neurological:     Mental Status: She is alert. Mental status is at baseline.  Psychiatric:        Mood and Affect: Mood normal.        Behavior: Behavior normal.     ED Results / Procedures / Treatments   Labs (all labs ordered are listed, but only abnormal results are displayed) Labs Reviewed  CBC - Abnormal; Notable for the following components:      Result Value   RBC 2.71 (*)     Hemoglobin 7.9 (*)    HCT 23.5 (*)    All other components within normal limits  BASIC METABOLIC PANEL - Abnormal; Notable for the following components:   Glucose, Bld 152 (*)    Creatinine, Ser 1.03 (*)    Calcium 8.7 (*)    GFR, Estimated 57 (*)    All other components within normal limits    EKG None  Radiology No results found.  Procedures .Critical Care  Performed by: Solon Augusta  S, PA Authorized by: Gailen Shelter, PA   Critical care provider statement:    Critical care time (minutes):  35   Critical care time was exclusive of:  Separately billable procedures and treating other patients and teaching time   Critical care was necessary to treat or prevent imminent or life-threatening deterioration of the following conditions: Symptomatic anemia will require transfusion.   Critical care was time spent personally by me on the following activities:  Development of treatment plan with patient or surrogate, review of old charts, re-evaluation of patient's condition, pulse oximetry, ordering and review of radiographic studies, ordering and review of laboratory studies, ordering and performing treatments and interventions, obtaining history from patient or surrogate, examination of patient and evaluation of patient's response to treatment   Care discussed with: admitting provider       Medications Ordered in ED Medications - No data to display  ED Course/ Medical Decision Making/ A&P Clinical Course as of 01/18/23 1916  Sat Jan 18, 2023  1451 Heavy bleeding since 10am this morning.  Bleeding started ~12 noon.  Not bleeding right now.  [WF]  1826 I spoke w/ Dr. Pricilla Holm, Gyne-Onc. She informed me that the goal of radiation is to to stop the bleeding and the tissue needs perfusion for radiation therapy to work, therefore they really try not to embolize unless necessary. Her recommendations are: 1. Transfuse 1 unit of PRBC, 2. Monitor h/h and 3. If bleeding is heavy,  admitting  team can reach her. If there is no bleeding or bleeding is mild, she will see the patient tomorrow morning.  [AN]    Clinical Course User Index [AN] Derwood Kaplan, MD [WF] Gailen Shelter, PA                             Medical Decision Making Amount and/or Complexity of Data Reviewed Labs: ordered.  Risk Decision regarding hospitalization.   This patient presents to the ED for concern of fatigue, vaginal bleeding, this involves a number of treatment options, and is a complaint that carries with it a moderate risk of complications and morbidity. A differential diagnosis was considered for the patient's symptoms which is discussed below:   Symptomatic anemia is top of DDX for fatigue.   The differential diagnosis of weakness includes but is not limited to neurologic causes (GBS, myasthenia gravis, CVA, MS, ALS, transverse myelitis, spinal cord injury, CVA, botulism, ) and other causes: ACS, Arrhythmia, syncope, orthostatic hypotension, sepsis, hypoglycemia, electrolyte disturbance, hypothyroidism, respiratory failure, symptomatic anemia, dehydration, heat injury, polypharmacy, malignancy.    Co morbidities: Discussed in HPI   Brief History:  Patient is a 72 year old female with a past medical history significant for atrial fibrillation no longer on anticoagulation but does take Plavix, hypertension, neuropathy, arthritis  Patient follows with Dr. Pricilla Holm of gynecology/oncology  She is currently receiving cervical radiation therapy for cervical cancer.  She states she has had several treatments over the past week it seems that she is doing nearly daily radiation therapy.  She had bleeding yesterday after her radiation therapy and had packing placed in the vaginal vault which was then removed and she has not had bleeding again until today after her radiation therapy she states that she started bleeding around 12 noon and blood until arrival to the emergency department.  She states she  does not feel that she is bleeding actively.  She states she feels generally more fatigued and weak  than usual.  Denies any nausea vomiting or abdominal pain.  She denies any fevers or chills no urinary frequency urgency dysuria or hematuria.  No chest pain or difficulty breathing.  She primarily describes her symptoms as overall just general weakness.    EMR reviewed including pt PMHx, past surgical history and past visits to ER.   See HPI for more details   Lab Tests:   I ordered and independently interpreted labs. Labs notable for Hemoglobin of 7.9 downtrending. -14 when checked 4 months ago -11.6 on check 2 months ago -8.9 yesterday  -7.9 today   BMP unremarkable   Imaging Studies:  No imaging studies ordered for this patient    Cardiac Monitoring:  The patient was maintained on a cardiac monitor.  I personally viewed and interpreted the cardiac monitored which showed an underlying rhythm of: NSR NA   Medicines ordered:  1 unit of PRBC will be transfused on arrival to [facility].  Unable to transfuse is here at [facility] given limited supplies and only emergent release blood  Critical Interventions:   admission   Consults/Attending Physician   I requested consultation with Dr. Pricilla Holm of oncology,  and discussed lab and imaging findings as well as pertinent plan - they recommend: admission, 1 unit PRBC Requests we call Dr. Pricilla Holm if heavy bleeding overnight. Pt to be seen tomorrow AM by Pricilla Holm or gyn/onc.   I discussed this case with my attending physician who cosigned this note including patient's presenting symptoms, physical exam, and planned diagnostics and interventions. Attending physician stated agreement with plan or made changes to plan which were implemented.   Attending physician assessed patient at bedside.   Reevaluation:  After the interventions noted above I re-evaluated patient and found that they have :stayed the  same   Social Determinants of Health:      Problem List / ED Course:  Patient has symptomatic anemia with hemoglobin of 7.9 in the setting of bleeding cervical tumor.   Dispostion:  After consideration of the diagnostic results and the patients response to treatment, I feel that the patent would benefit from admission  Final Clinical Impression(s) / ED Diagnoses Final diagnoses:  Symptomatic anemia    Rx / DC Orders ED Discharge Orders     None         Gailen Shelter, [place] 01/18/23 Jeri Lager, MD 01/19/23 [phone]

## 2023-01-18 NOTE — Progress Notes (Signed)
Pt has arrived to unit from [facility], via [facility], on stretcher. She is warm, dry, no visible distress.

## 2023-01-19 ENCOUNTER — Other Ambulatory Visit: Payer: Self-pay

## 2023-01-19 DIAGNOSIS — D649 Anemia, unspecified: Secondary | ICD-10-CM

## 2023-01-19 DIAGNOSIS — C539 Malignant neoplasm of cervix uteri, unspecified: Secondary | ICD-10-CM | POA: Diagnosis not present

## 2023-01-19 DIAGNOSIS — N939 Abnormal uterine and vaginal bleeding, unspecified: Secondary | ICD-10-CM | POA: Diagnosis not present

## 2023-01-19 DIAGNOSIS — Z5189 Encounter for other specified aftercare: Secondary | ICD-10-CM

## 2023-01-19 HISTORY — DX: Encounter for other specified aftercare: Z51.89

## 2023-01-19 LAB — COMPREHENSIVE METABOLIC PANEL
ALT: 14 U/L (ref 0–44)
AST: 14 U/L — ABNORMAL LOW (ref 15–41)
Albumin: 3.1 g/dL — ABNORMAL LOW (ref 3.5–5.0)
Alkaline Phosphatase: 50 U/L (ref 38–126)
Anion gap: 5 (ref 5–15)
BUN: 14 mg/dL (ref 8–23)
CO2: 29 mmol/L (ref 22–32)
Calcium: 8.5 mg/dL — ABNORMAL LOW (ref 8.9–10.3)
Chloride: 102 mmol/L (ref 98–111)
Creatinine, Ser: 1.06 mg/dL — ABNORMAL HIGH (ref 0.44–1.00)
GFR, Estimated: 55 mL/min — ABNORMAL LOW (ref >60)
Glucose, Bld: 114 mg/dL — ABNORMAL HIGH (ref 70–99)
Potassium: 3.8 mmol/L (ref 3.5–5.1)
Sodium: 136 mmol/L (ref 135–145)
Total Bilirubin: 0.4 mg/dL (ref 0.3–1.2)
Total Protein: 5.7 g/dL — ABNORMAL LOW (ref 6.5–8.1)

## 2023-01-19 LAB — CBC
HCT: 23.8 % — ABNORMAL LOW (ref 36.0–46.0)
Hemoglobin: 7.7 g/dL — ABNORMAL LOW (ref 12.0–15.0)
MCH: 29.1 pg (ref 26.0–34.0)
MCHC: 32.4 g/dL (ref 30.0–36.0)
MCV: 89.8 fL (ref 80.0–100.0)
Platelets: 107 10*3/uL — ABNORMAL LOW (ref 150–400)
RBC: 2.65 MIL/uL — ABNORMAL LOW (ref 3.87–5.11)
RDW: 13.9 % (ref 11.5–15.5)
WBC: 5.2 10*3/uL (ref 4.0–10.5)
nRBC: 0 % (ref 0.0–0.2)

## 2023-01-19 LAB — MAGNESIUM: Magnesium: 1.9 mg/dL (ref 1.7–2.4)

## 2023-01-19 LAB — HEMOGLOBIN AND HEMATOCRIT, BLOOD
HCT: 30.3 % — ABNORMAL LOW (ref 36.0–46.0)
Hemoglobin: 9.6 g/dL — ABNORMAL LOW (ref 12.0–15.0)

## 2023-01-19 LAB — PHOSPHORUS: Phosphorus: 4.3 mg/dL (ref 2.5–4.6)

## 2023-01-19 LAB — PREPARE RBC (CROSSMATCH)

## 2023-01-19 MED ORDER — SIMETHICONE 80 MG PO CHEW
80.0000 mg | CHEWABLE_TABLET | Freq: Four times a day (QID) | ORAL | Status: DC | PRN
  Administered 2023-01-19: 80 mg via ORAL
  Filled 2023-01-19: qty 1

## 2023-01-19 MED ORDER — SODIUM CHLORIDE 0.9% IV SOLUTION: Freq: Once | INTRAVENOUS | Status: AC

## 2023-01-19 NOTE — Progress Notes (Signed)
Pt stated that she was apprehensive about taking the unit of blood, that has been ordered. She requested to wait until AM lab work, to see what her H/H level is, at that time, and then decide if she will take it, at that time, or not. Shared this with the doctor- Dr. Margo Aye. Unit of blood transfusion is on hold at present time, per pt's request.

## 2023-01-19 NOTE — Progress Notes (Signed)
PROGRESS NOTE    [name]  YQM:[id number] DOB: 1951-05-01 DOA: 01/18/2023 PCP: [name], MD   Brief Narrative:  72 y.o. female with medical history significant for cervical cancer with ongoing radiation therapy, recurrent vaginal bleeding with passage of blood clots, GERD, CKD 3A, erythromelalgia, history of H. pylori gastritis, rheumatoid arthritis on sulfasalazine, paroxysmal A-fib not on oral anticoagulation, obesity presented with vaginal bleeding with excessive fatigue.  On presentation, hemoglobin was 7.9 (was 14 in January 2024) .  Packed red cell transfusion was ordered.  GYN oncology/Dr. Pricilla Holm was consulted  Assessment & Plan:   Vaginal bleeding in the setting of cervical cancer with ongoing radiation Acute blood loss anemia -hemoglobin was 7.9 (was 14 in January 2024).  Packed red cell transfusion was ordered but patient refused it overnight.  Hemoglobin 7.7 this morning.  She is now agreeable for transfusion.  Monitor H&H after transfusion. -Follow GYN oncology/Dr. Tucker's recommendations.  CKD stage IIIa -Creatinine stable.  Monitor  Thrombocytopenia -Questionable cause.  Monitor.  Rheumatoid arthritis -Continue sulfasalazine and hydroxychloroquine.  Outpatient follow-up with rheumatology  History of erythromelalgia -Outpatient follow-up  Morbid obesity -Outpatient follow-up    DVT prophylaxis: SCDs Code Status: Full Family Communication: Husband at bedside Disposition Plan: Status is: Inpatient Remains inpatient appropriate because: Of severity of illness    Consultants: GYN oncology  Procedures: None  Antimicrobials: None   Subjective: Patient seen and examined at bedside.  Still complains of intermittent vaginal bleeding.  No fever, vomiting, chest pain reported.  Objective: Vitals:   01/18/23 1915 01/18/23 2015 01/18/23 2101 01/19/23 0558  BP: 128/66 134/60 (!) 143/64 134/64  Pulse: 85 84 92 86  Resp: 17 16 18 16   Temp:    98.3 F (36.8 C) 98.2 F (36.8 C)  TempSrc:   Oral Oral  SpO2: 96% 96% 99% 100%  Weight:      Height:       No intake or output data in the 24 hours ending 01/19/23 1048 Filed Weights   01/18/23 1420  Weight: 117.9 kg    Examination:  General exam: Appears calm and comfortable.  On room air. Respiratory system: Bilateral decreased breath sounds at bases Cardiovascular system: S1 & S2 heard, Rate controlled Gastrointestinal system: Abdomen is morbidly obese, nondistended, soft and nontender. Normal bowel sounds heard. Extremities: No cyanosis, clubbing, edema  Central nervous system: Alert and oriented. No focal neurological deficits. Moving extremities Skin: No rashes, lesions or ulcers Psychiatry: Judgement and insight appear normal. Mood & affect appropriate.     Data Reviewed: I have personally reviewed following labs and imaging studies  CBC: Recent Labs  Lab 01/17/23 1439 01/18/23 1521 01/19/23 0544  WBC 7.9 5.6 5.2  NEUTROABS 5.4  --   --   HGB 8.9* 7.9* 7.7*  HCT 26.4* 23.5* 23.8*  MCV 87.1 86.7 89.8  PLT 217 212 107*   Basic Metabolic Panel: Recent Labs  Lab 01/18/23 1521 01/19/23 0544  NA 137 136  K 3.8 3.8  CL 99 102  CO2 29 29  GLUCOSE 152* 114*  BUN 12 14  CREATININE 1.03* 1.06*  CALCIUM 8.7* 8.5*  MG  --  1.9  PHOS  --  4.3   GFR: Estimated Creatinine Clearance: 60.8 mL/min (A) (by C-G formula based on SCr of 1.06 mg/dL (H)). Liver Function Tests: Recent Labs  Lab 01/19/23 0544  AST 14*  ALT 14  ALKPHOS 50  BILITOT 0.4  PROT 5.7*  ALBUMIN 3.1*   No results  for input(s): "LIPASE", "AMYLASE" in the last 168 hours. No results for input(s): "AMMONIA" in the last 168 hours. Coagulation Profile: No results for input(s): "INR", "PROTIME" in the last 168 hours. Cardiac Enzymes: No results for input(s): "CKTOTAL", "CKMB", "CKMBINDEX", "TROPONINI" in the last 168 hours. BNP (last 3 results) No results for input(s): "PROBNP" in the last  8760 hours. HbA1C: No results for input(s): "HGBA1C" in the last 72 hours. CBG: No results for input(s): "GLUCAP" in the last 168 hours. Lipid Profile: No results for input(s): "CHOL", "HDL", "LDLCALC", "TRIG", "CHOLHDL", "LDLDIRECT" in the last 72 hours. Thyroid Function Tests: No results for input(s): "TSH", "T4TOTAL", "FREET4", "T3FREE", "THYROIDAB" in the last 72 hours. Anemia Panel: No results for input(s): "VITAMINB12", "FOLATE", "FERRITIN", "TIBC", "IRON", "RETICCTPCT" in the last 72 hours. Sepsis Labs: No results for input(s): "PROCALCITON", "LATICACIDVEN" in the last 168 hours.  No results found for this or any previous visit (from the past 240 hour(s)).       Radiology Studies: No results found.      Scheduled Meds:  sodium chloride   Intravenous Once   ascorbic acid  500 mg Oral Weekly   cholecalciferol  5,000 Units Oral Daily   gabapentin  200 mg Oral BID   hydroxychloroquine  200 mg Oral BID WC   multivitamin with minerals  1 tablet Oral Daily   Muscle Rub   Topical QHS   sulfaSALAzine  1,000 mg Oral BID   Continuous Infusions:        Glade Lloyd, MD [facility] 01/19/2023, 10:48 AM

## 2023-01-19 NOTE — Consult Note (Addendum)
GYNECOLOGIC ONCOLOGY INPATIENT CONSULTATION  Date of Service: 01/19/23 Requesting Service: [facility] Requesting Provider: Glade Lloyd, MD Consulting Provider: Eugene Garnet, MD   HISTORY OF PRESENT ILLNESS: [name] is a 72 y.o. woman who is seen in consultation at the request of Dr. Hanley Ben for evaluation of vaginal bleeding and symptomatic anemia.  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  Treatment plan consisted of definitive radiation with sensitizing chemotherapy (carboplatin rather than cisplatin given neuropathy); ultimately, patient decided against concurrent sensitizing chemotherapy.  Radiation started this week on 5/28.   I saw the patient given acute vaginal bleeding on 5/31 at which time she endorsed bleeding and passage of clots when she came for radiation.  She had no active bleeding on my exam when I saw her in clinic.  Vagina was packed with quick clot gauze for approximately 1 hour.  No bleeding was noted after the gauze was removed.  The patient presented again yesterday, on 6/1, for radiation.  She had not had any significant vaginal bleeding since her visit with me.  Several hours after radiation, she endorses having brisk onset of heavy bleeding with passage of clots up to the size of her fist.  She felt lightheaded for short time, denies any further lightheadedness or dizziness.  She endorses feeling overwhelmingly weak.  She had a headache last night although thinks this was related to not having eaten much all day.  She endorses some pelvic cramping.  She denies vaginal bleeding since being admitted to the hospital.  She is voiding without difficulty.  Was restricting fluid intake given advice yesterday to minimize hydration to prevent further hemodilution.  Last bowel  movement was yesterday.  PAST MEDICAL HISTORY: Past Medical History:  Diagnosis Date   Allergy    Cataract    BILATERAL-REMOVED 2 YEARS AGO   COVID 09/2021   Erythromelalgia (HCC)    followed by neuro Dr Allena Katz , mgd on gabapentin , dx several years ago    GERD (gastroesophageal reflux disease)    Helicobacter pylori gastritis 10/05/2018   Hx of colonic polyp - ssp 11/03/2014   Hypercalcemia    Hypertension    Left maxillary fracture (HCC) 07/17/2017   fell down my stairs 2 year ago , deneis any metal in place nor difficulty with jax extension    Neuropathy    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 Lalla Brothers   Pseudogout of foot    Rheumatoid arthritis(714.0) dx 2010   Shingles    hx of    Tibial plateau fracture, right, closed, initial encounter 07/16/2017    PAST SURGICAL HISTORY: Past Surgical History:  Procedure Laterality Date   ATRIAL FIBRILLATION ABLATION N/A 04/16/2022   Procedure: ATRIAL FIBRILLATION ABLATION;  Surgeon: Lanier Prude, MD;  Location: [facility];  Service: Cardiovascular;  Laterality: N/A;   BREAST BIOPSY  1972   Broken wrist  2010   CARDIOVERSION N/A 11/22/2021   Procedure: CARDIOVERSION;  Surgeon: Sande Rives, MD;  Location: [facility] ENDOSCOPY;  Service: Cardiovascular;  Laterality: N/A;   CATARACT EXTRACTION  Bilateral 03/2012   COLONOSCOPY     COSMETIC SURGERY     FRACTURE SURGERY     INNER EAR SURGERY     busted ear drum   LEFT ATRIAL APPENDAGE OCCLUSION N/A 10/03/2022   Procedure: LEFT ATRIAL APPENDAGE OCCLUSION;  Surgeon: Lanier Prude, MD;  Location: [facility];  Service: Cardiovascular;  Laterality: N/A;   MAXIMUM ACCESS (MAS)POSTERIOR LUMBAR INTERBODY FUSION (PLIF) 1  LEVEL N/A 10/11/2016   Procedure: Lumbar one-Sacral one Maximum access posterior lumbar interbody fusion;  Surgeon: Maeola Harman, MD;  Location: [facility] OR;  Service: Neurosurgery;  Laterality: N/A;   ORIF WRIST FRACTURE Right 07/18/2017   Procedure: OPEN REDUCTION INTERNAL FIXATION (ORIF) WRIST FRACTURE;  Surgeon: Sheral Apley, MD;  Location: [facility] OR;  Service: Orthopedics;  Laterality: Right;   PARATHYROIDECTOMY Left 04/12/2019   Procedure: LEFT SUPERIOR PARATHYROIDECTOMY;  Surgeon: Darnell Level, MD;  Location: [facility] ORS;  Service: General;  Laterality: Left;   TEE WITHOUT CARDIOVERSION N/A 10/03/2022   Procedure: TRANSESOPHAGEAL ECHOCARDIOGRAM (TEE);  Surgeon: Lanier Prude, MD;  Location: [facility] LAB;  Service: Cardiovascular;  Laterality: N/A;    OB/GYN HISTORY: OB History  Gravida Para Term Preterm AB Living  3 3 3     3   SAB IAB Ectopic Multiple Live Births          3    # Outcome Date GA Lbr Len/2nd Weight Sex Delivery Anes PTL Lv  3 Term      Vag-Spont   LIV  2 Term      Vag-Spont   LIV  1 Term      Vag-Spont   LIV    MEDICATIONS:  Current Facility-Administered Medications:    acetaminophen (TYLENOL) tablet 650 mg, 650 mg, Oral, Q6H PRN, Darlin Drop, DO, 650 mg at 01/19/23 0820   ascorbic acid (VITAMIN C) tablet 500 mg, 500 mg, Oral, Weekly, Hall, Carole N, DO   cholecalciferol (VITAMIN D3) tablet 5,000 Units, 5,000 Units, Oral, Daily, Hall, Carole N, DO   fluticasone (FLONASE) 50 MCG/ACT nasal spray 1-2 spray, 1-2 spray, Each Nare, Daily PRN, Hall, Carole N, DO   gabapentin (NEURONTIN) capsule 200 mg, 200 mg, Oral, BID, Hall, Carole N, DO, 200 mg at 01/19/23 0003   hydroxychloroquine (PLAQUENIL) tablet 200 mg, 200 mg, Oral, BID WC, Hall, Carole N, DO, 200 mg at 01/19/23 0004   melatonin tablet 5 mg, 5 mg, Oral, QHS PRN, Margo Aye, Carole N, DO   multivitamin with minerals tablet 1 tablet, 1 tablet, Oral, Daily, Hall, Carole N, DO   Muscle Rub CREA, , Topical, QHS,  Hall, Oliver Pila, DO, Given at 01/19/23 0006   oxyCODONE (Oxy IR/ROXICODONE) immediate release tablet 5 mg, 5 mg, Oral, Q6H PRN, Hall, Carole N, DO   polyethylene glycol (MIRALAX / GLYCOLAX) packet 17 g, 17 g, Oral, Daily PRN, Hall, Carole N, DO   prochlorperazine (COMPAZINE) injection 5 mg, 5 mg, Intravenous, Q6H PRN, Hall, Carole N, DO, 5 mg at 01/18/23 2322   sulfaSALAzine (AZULFIDINE) tablet 1,000 mg, 1,000 mg, Oral, BID, Hall, Carole N, DO, 1,000 mg at 01/19/23 0005  ALLERGIES: Allergies  Allergen Reactions   Amlodipine Besylate Other (See Comments)    Tremors   Divalproex Sodium Swelling and Other (See Comments)   Nortriptyline Nausea And Vomiting and Other (See Comments)    tremors   Hydromorphone Hcl Other (See Comments)    Headache, muscle tightness   Other     Band-aid (skin redness)  Rosuvastatin Other (See Comments)    Muscle aches, pains. Resolved with stopping medication.  Muscle aches / pains   Tramadol Other (See Comments)    Felt "stoned"    FAMILY HISTORY: Family History  Problem Relation Age of Onset   Stroke Mother    Hypertension Father    Heart attack Father    Hypertension Sister        x 3   Hyperthyroidism Sister        x2, s/p RAI ablation   Liver cancer Sister    Hypertension Brother    Hypothyroidism Brother    Colon cancer Neg Hx    Esophageal cancer Neg Hx    Rectal cancer Neg Hx    Stomach cancer Neg Hx    Breast cancer Neg Hx    Ovarian cancer Neg Hx    Endometrial cancer Neg Hx    Pancreatic cancer Neg Hx    Prostate cancer Neg Hx     SOCIAL HISTORY: Social History   Socioeconomic History   Marital status: Married    Spouse name: Not on file   Number of children: 3   Years of education: Not on file   Highest education level: 11th grade  Occupational History   Occupation: Retired  Tobacco Use   Smoking status: Former    Packs/day: 4.00    Years: 4.00    Additional pack years: 0.00    Total pack years: 16.00    Types:  Cigarettes    Start date: 25    Quit date: 1985    Years since quitting: 39.4   Smokeless tobacco: Never  Vaping Use   Vaping Use: Never used  Substance and Sexual Activity   Alcohol use: No    Alcohol/week: 0.0 standard drinks of alcohol   Drug use: No   Sexual activity: Not Currently  Other Topics Concern   Not on file  Social History Narrative   Artist -retired Designer, fashion/clothing   Married, lives with spouse, Dannielle Huh, he is IT support for [employer]   3 sons   2 caffeinated beverages a day   No regular exercise, diet is ok   Right handed   Social Determinants of Health   Financial Resource Strain: Low Risk  (07/18/2022)   Overall Financial Resource Strain (CARDIA)    Difficulty of Paying Living Expenses: Not hard at all  Food Insecurity: No Food Insecurity (01/19/2023)   Hunger Vital Sign    Worried About Running Out of Food in the Last Year: Never true    Ran Out of Food in the Last Year: Never true  Transportation Needs: No Transportation Needs (01/19/2023)   PRAPARE - Administrator, Civil Service (Medical): No    Lack of Transportation (Non-Medical): No  Physical Activity: Inactive (07/18/2022)   Exercise Vital Sign    Days of Exercise per Week: 0 days    Minutes of Exercise per Session: 0 min  Stress: No Stress Concern Present (07/18/2022)   [employer] of Occupational Health - Occupational Stress Questionnaire    Feeling of Stress : Not at all  Social Connections: Socially Integrated (07/18/2022)   Social Connection and Isolation Panel [NHANES]    Frequency of Communication with Friends and Family: More than three times a week    Frequency of Social Gatherings with Friends and Family: More than three times a week    Attends Religious Services: More than 4 times per year    Active  Member of Clubs or Organizations: Yes    Attends Engineer, structural: More than 4 times per year    Marital Status: Married  Catering manager  Violence: Not At Risk (01/19/2023)   Humiliation, Afraid, Rape, and Kick questionnaire    Fear of Current or Ex-Partner: No    Emotionally Abused: No    Physically Abused: No    Sexually Abused: No    REVIEW OF SYSTEMS: Complete 10-system review is negative except for the following: light headedness yesterday, weakness, cramping.  PHYSICAL EXAM: BP 134/64 (BP Location: Right Arm)   Pulse 86   Temp 98.2 F (36.8 C) (Oral)   Resp 16   Ht 5\' 6"  (1.676 m)   Wt 259 lb 14.8 oz (117.9 kg)   SpO2 100%   BMI 41.95 kg/m  General: Alert, oriented, no acute distress. HEENT: Normocephalic, atraumatic.  Sclera anicteric.  Mild conjunctival pallor. Chest: Clear to auscultation bilaterally. Cardiovascular: Regular rate and rhythm, no murmurs, rubs, or gallops. Abdomen: Obese.  Normoactive bowel sounds.  Soft, nondistended, nontender to palpation.  Extremities: Grossly normal range of motion.  Warm, well perfused.  No edema bilaterally. SCDs in place. Skin: No rashes or lesions. GU: External genitalia without lesions.  No active bleeding noted on exam. 14 French Foley catheter placed without difficulty with mildly concentrated appearing urine noted.  Vaginal packing then performed.  Surgicel placed first at the vaginal apex against the tumor.  2 inch vaginal packing then placed for compression.  Patient tolerated this well.  LABORATORY AND RADIOLOGIC DATA:    Latest Ref Rng & Units 01/19/2023    5:44 AM 01/18/2023    3:21 PM 01/17/2023    2:39 PM  CBC  WBC 4.0 - 10.5 K/uL 5.2  5.6  7.9   Hemoglobin 12.0 - 15.0 g/dL 7.7  7.9  8.9   Hematocrit 36.0 - 46.0 % 23.8  23.5  26.4   Platelets 150 - 400 K/uL 107  212  217       Latest Ref Rng & Units 01/19/2023    5:44 AM 01/18/2023    3:21 PM 11/19/2022    9:15 AM  BMP  Glucose 70 - 99 mg/dL 086  578  469   BUN 8 - 23 mg/dL 14  12  15    Creatinine 0.44 - 1.00 mg/dL 6.29  5.28  4.13   Sodium 135 - 145 mmol/L 136  137  139   Potassium 3.5 - 5.1 mmol/L  3.8  3.8  3.8   Chloride 98 - 111 mmol/L 102  99  100   CO2 22 - 32 mmol/L 29  29  31    Calcium 8.9 - 10.3 mg/dL 8.5  8.7  9.7    ASSESSMENT AND PLAN: JAPJI KLUK is a 72 y.o. woman with advanced stage squamous cell carcinoma of the cervix who began definitive radiation treatment within the last week now presenting with second large-volume bleed and symptomatic anemia.  Patient is overall doing well this morning without further bleeding since being admitted to the hospital.  H&H is stable this morning from yesterday.  The patient had declined transfusion overnight, wanting to see what her hemoglobin was this morning.  We discussed benefits of transfusion including improvement of her symptoms (lightheadedness, weakness) and benefit to her cancer treatment.  Given importance of oxygen delivery to tissue during radiation therapy, I would recommend that her goal minimum hemoglobin be 9.  We discussed transfusion of a unit this morning  with plan for 4-hour H&H recheck.  If hemoglobin remains below 9, as I suspect it will, I would recommend transfusion of an additional unit of packed red blood cells.  We discussed the typical decrease and ultimate cessation of vaginal bleeding seen with radiation.  In some patients, given quantity of bleeding and difficulty maintaining hemoglobin despite radiation treatment, we can consider uterine artery embolization.  I reviewed that this is typically something we attempt to avoid if at all possible given concern that this will make radiation less effective due to decreased blood supply and thus oxygenation of the cervical tumor.  Given plan for observation today, I suggested Foley catheter placement with vaginal packing.  This was done without incident this morning.  Packing will be removed tomorrow.  Ideally, the patient would undergo her radiation treatment tomorrow and be observed for several hours to assure no recurrent heavy vaginal bleeding prior to  discharge.  I agree with holding her Plavix while here in the hospital.  Cardiology plan was that she would continue on Plavix for 6 months until late September (in the setting of ablation for her persistent a fib).  The above assessment and plan was communicated to the patient's primary team.  Eugene Garnet MD Gynecologic Oncology

## 2023-01-20 ENCOUNTER — Ambulatory Visit
Admission: RE | Admit: 2023-01-20 | Discharge: 2023-01-20 | Disposition: A | Discharge status: Home / Self Care | Payer: Medicare Other | Source: Ambulatory Visit | Attending: Radiation Oncology | Admitting: Radiation Oncology

## 2023-01-20 ENCOUNTER — Ambulatory Visit: Payer: Medicare Other

## 2023-01-20 ENCOUNTER — Other Ambulatory Visit: Payer: Self-pay

## 2023-01-20 DIAGNOSIS — Z510 Encounter for antineoplastic radiation therapy: Secondary | ICD-10-CM | POA: Diagnosis not present

## 2023-01-20 DIAGNOSIS — D649 Anemia, unspecified: Secondary | ICD-10-CM | POA: Diagnosis not present

## 2023-01-20 DIAGNOSIS — N939 Abnormal uterine and vaginal bleeding, unspecified: Secondary | ICD-10-CM | POA: Diagnosis not present

## 2023-01-20 LAB — CBC WITH DIFFERENTIAL/PLATELET
Abs Immature Granulocytes: 0.04 10*3/uL (ref 0.00–0.07)
Basophils Absolute: 0 10*3/uL (ref 0.0–0.1)
Basophils Relative: 0 %
Eosinophils Absolute: 0.1 10*3/uL (ref 0.0–0.5)
Eosinophils Relative: 2 %
HCT: 26 % — ABNORMAL LOW (ref 36.0–46.0)
Hemoglobin: 8.4 g/dL — ABNORMAL LOW (ref 12.0–15.0)
Immature Granulocytes: 1 %
Lymphocytes Relative: 12 %
Lymphs Abs: 0.7 10*3/uL (ref 0.7–4.0)
MCH: 29.4 pg (ref 26.0–34.0)
MCHC: 32.3 g/dL (ref 30.0–36.0)
MCV: 90.9 fL (ref 80.0–100.0)
Monocytes Absolute: 0.5 10*3/uL (ref 0.1–1.0)
Monocytes Relative: 8 %
Neutro Abs: 4.5 10*3/uL (ref 1.7–7.7)
Neutrophils Relative %: 77 %
Platelets: 183 10*3/uL (ref 150–400)
RBC: 2.86 MIL/uL — ABNORMAL LOW (ref 3.87–5.11)
RDW: 13.8 % (ref 11.5–15.5)
WBC: 5.9 10*3/uL (ref 4.0–10.5)
nRBC: 0.3 % — ABNORMAL HIGH (ref 0.0–0.2)

## 2023-01-20 LAB — BASIC METABOLIC PANEL
Anion gap: 5 (ref 5–15)
BUN: 12 mg/dL (ref 8–23)
CO2: 28 mmol/L (ref 22–32)
Calcium: 8.4 mg/dL — ABNORMAL LOW (ref 8.9–10.3)
Chloride: 100 mmol/L (ref 98–111)
Creatinine, Ser: 1.03 mg/dL — ABNORMAL HIGH (ref 0.44–1.00)
GFR, Estimated: 57 mL/min — ABNORMAL LOW (ref >60)
Glucose, Bld: 111 mg/dL — ABNORMAL HIGH (ref 70–99)
Potassium: 3.8 mmol/L (ref 3.5–5.1)
Sodium: 133 mmol/L — ABNORMAL LOW (ref 135–145)

## 2023-01-20 LAB — TYPE AND SCREEN
ABO/RH(D): A POS
Antibody Screen: NEGATIVE
Unit division: 0
Unit division: 0

## 2023-01-20 LAB — BPAM RBC

## 2023-01-20 LAB — RAD ONC ARIA SESSION SUMMARY
Course Elapsed Days: 6
Plan Fractions Treated to Date: 6
Plan Prescribed Dose Per Fraction: 1.8 Gy
Plan Total Fractions Prescribed: 25
Plan Total Prescribed Dose: 45 Gy
Reference Point Dosage Given to Date: 10.8 Gy
Reference Point Session Dosage Given: 1.8 Gy
Session Number: 6

## 2023-01-20 LAB — MAGNESIUM: Magnesium: 2 mg/dL (ref 1.7–2.4)

## 2023-01-20 LAB — HEMOGLOBIN AND HEMATOCRIT, BLOOD
HCT: 27.6 % — ABNORMAL LOW (ref 36.0–46.0)
Hemoglobin: 9.2 g/dL — ABNORMAL LOW (ref 12.0–15.0)

## 2023-01-20 LAB — PREPARE RBC (CROSSMATCH)

## 2023-01-20 MED ORDER — SODIUM CHLORIDE 0.9% IV SOLUTION: Freq: Once | INTRAVENOUS | Status: AC

## 2023-01-20 MED ORDER — METOPROLOL SUCCINATE ER 50 MG PO TB24
50.0000 mg | ORAL_TABLET | Freq: Two times a day (BID) | ORAL | Status: DC
  Administered 2023-01-20: 50 mg via ORAL
  Filled 2023-01-20: qty 1

## 2023-01-20 MED ORDER — ACETAMINOPHEN 325 MG PO TABS
650.0000 mg | ORAL_TABLET | Freq: Once | ORAL | Status: AC
  Administered 2023-01-20: 650 mg via ORAL
  Filled 2023-01-20: qty 2

## 2023-01-20 MED ORDER — FUROSEMIDE 40 MG PO TABS
60.0000 mg | ORAL_TABLET | Freq: Every day | ORAL | Status: DC
  Administered 2023-01-20: 60 mg via ORAL
  Filled 2023-01-20: qty 1

## 2023-01-20 NOTE — Progress Notes (Signed)
GYN Oncology Progress Note  Patient reports being very uncomfortable with packing in place.  She states she is unable to move around with this in place.  She tried sitting up in the chair yesterday and had moderate discomfort requiring her to get back in bed.  She is passing flatus.  Last BM was day before yesterday.  No vaginal bleeding noted through the packing but states she has no way to look down there.  No nausea or emesis reported.  She would like a medication for excessive intestinal gas. Pt feels her lower extremity edema is worse due to not having her home fluid pill and BP med.   CBC    Component Value Date/Time   WBC 5.9 01/20/2023 0547   RBC 2.86 (L) 01/20/2023 0547   HGB 8.4 (L) 01/20/2023 0547   HGB 8.9 (L) 01/17/2023 1439   HGB 14.1 09/16/2022 1114   HCT 26.0 (L) 01/20/2023 0547   HCT 41.7 09/16/2022 1114   PLT 183 01/20/2023 0547   PLT 217 01/17/2023 1439   PLT 229 09/16/2022 1114   MCV 90.9 01/20/2023 0547   MCV 87 09/16/2022 1114   MCH 29.4 01/20/2023 0547   MCHC 32.3 01/20/2023 0547   RDW 13.8 01/20/2023 0547   RDW 13.2 09/16/2022 1114   LYMPHSABS 0.7 01/20/2023 0547   LYMPHSABS 1.8 03/28/2022 1349   MONOABS 0.5 01/20/2023 0547   EOSABS 0.1 01/20/2023 0547   EOSABS 0.2 03/28/2022 1349   BASOSABS 0.0 01/20/2023 0547   BASOSABS 0.0 03/28/2022 1349  BMET    Component Value Date/Time   NA 133 (L) 01/20/2023 0547   NA 143 11/04/2022 1010   K 3.8 01/20/2023 0547   CL 100 01/20/2023 0547   CO2 28 01/20/2023 0547   GLUCOSE 111 (H) 01/20/2023 0547   BUN 12 01/20/2023 0547   BUN 15 11/04/2022 1010   CREATININE 1.03 (H) 01/20/2023 0547   CREATININE 1.59 (H) 08/17/2021 1531   CALCIUM 8.4 (L) 01/20/2023 0547   EGFR 55 (L) 11/04/2022 1010   GFRNONAA 57 (L) 01/20/2023 0547   GFRNONAA 57 (L) 07/30/2018 1117    Exam:  Alert, oriented, laying flat in bed. Lungs clear. Heart regular in rate and rhythm. Abdomen obese, soft, active bowel sounds. Bilateral lower extrem  edema with mild erythema pedally noted.   Assessment/Plan: 72 year old female with Stage III C1 squamous cell carcinoma of the cervix with a large cervical tumor, upper vaginal and bilateral parametrial involvement and hypermetabolic bilateral iliac chain adenopathy.  Treatment plan consisted of definitive radiation with sensitizing chemotherapy (carboplatin rather than cisplatin given neuropathy); ultimately, patient decided against concurrent sensitizing chemotherapy.  Radiation started the week on 5/28. Currently admitted as of 01/18/23 with vaginal bleeding and symptomatic anemia.   Discussed am labs results. Since Hgb down to 8.4, recommendation for additional unit of PRBCs per Dr. Pricilla Holm. Pt is agreeable to receive this. Plans for radiation this am with Dr. Roselind Messier removing the packing once pt is in clinic. Advised patient foley removal would be per Dr. Roselind Messier.

## 2023-01-20 NOTE — Discharge Summary (Signed)
Physician Discharge Summary  [name] BJY:[id number] DOB: [birth year] DOA: 01/18/2023  PCP: [name], MD  Admit date: 01/18/2023 Discharge date: 01/20/2023  Admitted From: Home Disposition: Home  Recommendations for Outpatient Follow-up:  Follow up with PCP in 1 week with repeat CBC/BMP Outpatient follow-up with GYN oncology/Dr. Pricilla Holm Outpatient follow-up with cardiology Follow up in ED if symptoms worsen or new appear   Home Health: No Equipment/Devices: None  Discharge Condition: Stable CODE STATUS: Full Diet recommendation: Heart healthy  Brief/Interim Summary: 72 y.o. female with medical history significant for cervical cancer with ongoing radiation therapy, recurrent vaginal bleeding with passage of blood clots, GERD, CKD 3A, erythromelalgia, history of H. pylori gastritis, rheumatoid arthritis on sulfasalazine, paroxysmal A-fib not on oral anticoagulation, obesity presented with vaginal bleeding with excessive fatigue.  On presentation, hemoglobin was 7.9 (was 14 in January 2024) .  Packed red cell transfusion was ordered.  GYN oncology/Dr. Pricilla Holm was consulted.  She underwent vaginal packing and Foley catheter placement.  Her hemoglobin has remained stable after 1 unit packed red cells transfusion.  GYN oncology is planning to remove vaginal packing and Foley catheter.  She will have radiation treatment afterwards.  If she remains stable, GYN oncology has cleared the patient for discharge.  Outpatient follow-up with PCP and GYN oncology.  Plavix on hold for now.   Discharge Diagnoses:    Vaginal bleeding in the setting of cervical cancer with ongoing radiation Acute blood loss anemia -hemoglobin was 7.9 (was 14 in January 2024).  She was transfused with 1 unit of packed red cells. Hemoglobin 8.4 this morning. She will be transfused 1 unit of packed red cells today as per gyn oncology. -GYN oncology/Dr. Pricilla Holm was consulted.  She underwent vaginal packing and Foley  catheter placement. -GYN oncology is planning to remove vaginal packing and Foley catheter.  She will have radiation treatment.  If she remains stable, GYN oncology has cleared the patient for discharge.  Outpatient follow-up with PCP and GYN oncology.  Plavix on hold for now till reevaluation by gyn oncology.  Persistent A-fib -Status post Watchman device placement: She was on Eliquis in the past which was switched to oral Plavix on 11/11/2022 with plans for Plavix for total of 6 months post implant therapy.  Plavix currently on hold.  Resume metoprolol.  Hypertension -Blood pressure stable.  Resume losartan, metoprolol and lasix on discharge with outpatient follow-up with PCP.  CKD stage IIIa -Creatinine stable.  Monitor  Hyponatremia -Mild.  Outpatient follow-up.   Thrombocytopenia -Questionable cause.  Monitor.   Rheumatoid arthritis -Continue sulfasalazine and hydroxychloroquine.  Outpatient follow-up with rheumatology   History of erythromelalgia -Outpatient follow-up   Morbid obesity -Outpatient follow-up   Discharge Instructions   Allergies as of 01/20/2023       Reactions   Amlodipine Besylate Other (See Comments)   Tremors   Divalproex Sodium Swelling, Other (See Comments)   Nortriptyline Nausea And Vomiting, Other (See Comments)   tremors   Hydromorphone Hcl Other (See Comments)   Headache, muscle tightness   Other    Band-aid (skin redness)   Rosuvastatin Other (See Comments)   Muscle aches, pains. Resolved with stopping medication. Muscle aches / pains   Tramadol Other (See Comments)   Felt "stoned"        Medication List     STOP taking these medications    clopidogrel 75 MG tablet Commonly known as: PLAVIX       TAKE these medications    acetaminophen  650 MG CR tablet Commonly known as: TYLENOL Take 1,300 mg by mouth every 8 (eight) hours as needed for pain.   amoxicillin 500 MG capsule Commonly known as: AMOXIL Take 4 capsules (2,000  mg total) by mouth as directed.   ascorbic acid 500 MG tablet Commonly known as: VITAMIN C Take 500 mg by mouth once a week.   Biofreeze 4 % Gel Generic drug: Menthol (Topical Analgesic) Apply 1 application  topically at bedtime. Neuropathy feet/ankles   BIOTIN PO Take 1 tablet by mouth at bedtime.   FeroSul 325 (65 FE) MG tablet Generic drug: ferrous sulfate Take 1 tablet (325 mg total) by mouth daily.   fluticasone 50 MCG/ACT nasal spray Commonly known as: FLONASE Place 1-2 sprays into both nostrils daily as needed for allergies or rhinitis.   furosemide 20 MG tablet Commonly known as: LASIX Take 2 - 3 tablets (40 - 60 mg total) by mouth in the morning and at bedtime. What changed: additional instructions   gabapentin 100 MG capsule Commonly known as: NEURONTIN TAKE 3 CAPSULES BY MOUTH IN THE MORNING AND 2 CAPSULES AT BEDTIME. OK TO TAKE EXTRA CAPSULE AT BEDTIME AS NEEDED   hydroxychloroquine 200 MG tablet Commonly known as: PLAQUENIL Take 1 tablet (200 mg total) by mouth 2 (two) times daily with food or milk   hydroxypropyl methylcellulose / hypromellose 2.5 % ophthalmic solution Commonly known as: ISOPTO TEARS / GONIOVISC Place 1 drop into both eyes in the morning.   losartan 100 MG tablet Commonly known as: COZAAR TAKE 1 TABLET BY MOUTH ONCE A DAY   metoprolol succinate 50 MG 24 hr tablet Commonly known as: TOPROL-XL Take 1 tablet (50 mg total) by mouth 2 (two) times daily.   multivitamin with minerals tablet Take 2 tablets by mouth daily.   potassium chloride SA 20 MEQ tablet Commonly known as: KLOR-CON M Take 1 tablet by mouth 2 times daily.   prochlorperazine 10 MG tablet Commonly known as: COMPAZINE Take 1 tablet (10 mg) by mouth every 6 hours as needed for nausea or vomiting.   sulfaSALAzine 500 MG tablet Commonly known as: AZULFIDINE Take 2 tablets (1,000 mg total) by mouth 2 (two) times daily. What changed: Another medication with the same name  was removed. Continue taking this medication, and follow the directions you see here.   Vitamin D 125 MCG (5000 UT) Caps Take 5,000 Units by mouth in the morning.        Follow-up Information     [name], MD. Schedule an appointment as soon as possible for a visit in 1 week(s).   Specialty: Family Medicine Why: with repeat cbc/bmp Contact information: [address] [place] [address] [phone]         Carver Fila, MD. Schedule an appointment as soon as possible for a visit in 1 week(s).   Specialty: Gynecologic Oncology Contact information: [address] [place] [address] [phone]                Allergies  Allergen Reactions   Amlodipine Besylate Other (See Comments)    Tremors   Divalproex Sodium Swelling and Other (See Comments)   Nortriptyline Nausea And Vomiting and Other (See Comments)    tremors   Hydromorphone Hcl Other (See Comments)    Headache, muscle tightness   Other     Band-aid (skin redness)   Rosuvastatin Other (See Comments)    Muscle aches, pains. Resolved with stopping medication.  Muscle  aches / pains   Tramadol Other (See Comments)    Felt "stoned"    Consultations: GYN oncology   Procedures/Studies: NM PET Image Initial (PI) Skull Base To Thigh (F-18 FDG)  Result Date: 12/30/2022 CLINICAL DATA:  Initial treatment strategy for cervical cancer. EXAM: NUCLEAR MEDICINE PET SKULL BASE TO THIGH TECHNIQUE: 13.0 mCi F-18 FDG was injected intravenously. Full-ring PET imaging was performed from the skull base to thigh after the radiotracer. CT data was obtained and used for attenuation correction and anatomic localization. Fasting blood glucose: 108 mg/dl COMPARISON:  MR pelvis 12/18/2022 CT chest 10/10/2021 and CT abdomen pelvis 08/10/2019. FINDINGS: Mediastinal blood pool activity: SUV max 3.1 Liver activity: SUV max NA NECK: Hypermetabolic left level 2 lymph nodes measure up to 5 mm (4/23), SUV  4.5. No additional abnormal hypermetabolism. Incidental CT findings: None. CHEST: Focal hypermetabolism in the medial right thyroid may correspond to a 12 mm low-attenuation nodule (4/34), SUV max 5.2. No additional abnormal hypermetabolism. Incidental CT findings: Atherosclerotic calcification of the aorta, aortic valve and coronary arteries. Left atrial appendage occlusion device. Enlarged pulmonic trunk and heart. No pericardial or pleural effusion. ABDOMEN/PELVIS: Intensely hypermetabolic cervical mass measures approximately 4.5 x 5.9 cm, SUV max 27.0. Hypermetabolic left external iliac lymph node measures 10 mm (4/129), SUV max 7.2. Hypermetabolic right common iliac lymph node measures 11 mm (4/119), SUV max 4.6. Mildly hypermetabolic portacaval lymph node is not enlarged, 4 mm, SUV max 3.9. No abnormal hypermetabolism in the liver, adrenal glands, spleen or pancreas. Incidental CT findings: Possible tiny left hepatic lobe cyst. Liver, gallbladder, adrenal glands, kidneys, spleen pancreas stomach and bowel are otherwise grossly unremarkable. SKELETON: No abnormal hypermetabolism. Incidental CT findings: Degenerative changes in the spine.  L5-S1 fusion. IMPRESSION: 1. Hypermetabolic cervical mass with hypermetabolic metastatic bilateral iliac chain lymph nodes. 2. Mildly hypermetabolic unenlarged left level II cervical and portacaval lymph nodes are nonspecific. Metastatic disease is difficult to definitively exclude. Recommend attention on follow-up. 3. Focal hypermetabolism associated with a right thyroid nodule. Patient recently underwent thyroid ultrasound 07/08/2022. Please refer to that report. 4. Aortic atherosclerosis (ICD10-I70.0). Coronary artery calcification. 5. Enlarged pulmonic trunk, indicative of pulmonary arterial hypertension. Electronically Signed   By: Leanna Battles M.D.   On: 12/30/2022 15:29      Subjective: Patient seen and examined at bedside.  No fever or vomiting reported.   Hoping to get the vaginal packing and Foley catheter removed soon.  No chest pain or shortness of breath reported.  Discharge Exam: Vitals:   01/19/23 2111 01/20/23 0514  BP: (!) 159/54 (!) 154/64  Pulse: 94 86  Resp: 18 18  Temp: 99 F (37.2 C) 97.9 F (36.6 C)  SpO2: 97% 92%    General: Pt is alert, awake, not in acute distress.  Elderly female lying in bed.  On room air. Cardiovascular: rate controlled, S1/S2 + Respiratory: bilateral decreased breath sounds at bases Abdominal: Soft, morbidly obese NT, ND, bowel sounds + Extremities: Trace lower extremity edema; no cyanosis    The results of significant diagnostics from this hospitalization (including imaging, microbiology, ancillary and laboratory) are listed below for reference.     Microbiology: No results found for this or any previous visit (from the past 240 hour(s)).   Labs: BNP (last 3 results) No results for input(s): "BNP" in the last 8760 hours. Basic Metabolic Panel: Recent Labs  Lab 01/18/23 1521 01/19/23 0544 01/20/23 0547  NA 137 136 133*  K 3.8 3.8 3.8  CL 99 102 100  CO2 29 29 28   GLUCOSE 152* 114* 111*  BUN 12 14 12   CREATININE 1.03* 1.06* 1.03*  CALCIUM 8.7* 8.5* 8.4*  MG  --  1.9 2.0  PHOS  --  4.3  --    Liver Function Tests: Recent Labs  Lab 01/19/23 0544  AST 14*  ALT 14  ALKPHOS 50  BILITOT 0.4  PROT 5.7*  ALBUMIN 3.1*   No results for input(s): "LIPASE", "AMYLASE" in the last 168 hours. No results for input(s): "AMMONIA" in the last 168 hours. CBC: Recent Labs  Lab 01/17/23 1439 01/18/23 1521 01/19/23 0544 01/19/23 1700 01/20/23 0547  WBC 7.9 5.6 5.2  --  5.9  NEUTROABS 5.4  --   --   --  4.5  HGB 8.9* 7.9* 7.7* 9.6* 8.4*  HCT 26.4* 23.5* 23.8* 30.3* 26.0*  MCV 87.1 86.7 89.8  --  90.9  PLT 217 212 107*  --  183   Cardiac Enzymes: No results for input(s): "CKTOTAL", "CKMB", "CKMBINDEX", "TROPONINI" in the last 168 hours. BNP: Invalid input(s): "POCBNP" CBG: No  results for input(s): "GLUCAP" in the last 168 hours. D-Dimer No results for input(s): "DDIMER" in the last 72 hours. Hgb A1c No results for input(s): "HGBA1C" in the last 72 hours. Lipid Profile No results for input(s): "CHOL", "HDL", "LDLCALC", "TRIG", "CHOLHDL", "LDLDIRECT" in the last 72 hours. Thyroid function studies No results for input(s): "TSH", "T4TOTAL", "T3FREE", "THYROIDAB" in the last 72 hours.  Invalid input(s): "FREET3" Anemia work up No results for input(s): "VITAMINB12", "FOLATE", "FERRITIN", "TIBC", "IRON", "RETICCTPCT" in the last 72 hours. Urinalysis    Component Value Date/Time   COLORURINE YELLOW 06/28/2018 1120   APPEARANCEUR CLEAR 06/28/2018 1120   LABSPEC 1.005 06/28/2018 1120   PHURINE 6.0 06/28/2018 1120   GLUCOSEU NEGATIVE 06/28/2018 1120   GLUCOSEU NEGATIVE 04/29/2014 1244   HGBUR NEGATIVE 06/28/2018 1120   BILIRUBINUR negative 07/13/2018 1003   KETONESUR NEGATIVE 06/28/2018 1120   PROTEINUR Negative 07/13/2018 1003   PROTEINUR NEGATIVE 06/28/2018 1120   UROBILINOGEN 0.2 07/13/2018 1003   UROBILINOGEN 0.2 04/29/2014 1244   NITRITE negative 07/13/2018 1003   NITRITE NEGATIVE 06/28/2018 1120   LEUKOCYTESUR Trace (A) 07/13/2018 1003   Sepsis Labs Recent Labs  Lab 01/17/23 1439 01/18/23 1521 01/19/23 0544 01/20/23 0547  WBC 7.9 5.6 5.2 5.9   Microbiology No results found for this or any previous visit (from the past 240 hour(s)).   Time coordinating discharge: 35 minutes  SIGNED:   Glade Lloyd, MD  [facility] 01/20/2023, 10:10 AM

## 2023-01-20 NOTE — Progress Notes (Signed)
PROGRESS NOTE    HARBOUR CONVEY  ZOX:[id number] DOB: [birth year] DOA: 01/18/2023 PCP: [name], MD   Brief Narrative:  72 y.o. female with medical history significant for cervical cancer with ongoing radiation therapy, recurrent vaginal bleeding with passage of blood clots, GERD, CKD 3A, erythromelalgia, history of H. pylori gastritis, rheumatoid arthritis on sulfasalazine, paroxysmal A-fib not on oral anticoagulation, obesity presented with vaginal bleeding with excessive fatigue.  On presentation, hemoglobin was 7.9 (was 14 in January 2024) .  Packed red cell transfusion was ordered.  GYN oncology/Dr. Pricilla Holm was consulted  Assessment & Plan:   Vaginal bleeding in the setting of cervical cancer with ongoing radiation Acute blood loss anemia -hemoglobin was 7.9 (was 14 in January 2024).   She was transfused with 1 unit of packed red cells. Hemoglobin 8.4 this morning.  -GYN oncology/Dr. Pricilla Holm was consulted.  She underwent vaginal packing and Foley catheter placement. -GYN oncology is planning to remove vaginal packing and Foley catheter today.  She will have radiation treatment afterwards.   Persistent A-fib -Status post Watchman device placement: She was on Eliquis in the past which was switched to oral Plavix on 11/11/2022 with plans for Plavix for total of 6 months post implant therapy.  Plavix currently on hold.  Resume metoprolol.  Hypertension -Blood pressure stable.  Resume metoprolol and lasix on discharge with outpatient follow-up with PCP.  CKD stage IIIa -Creatinine stable.  Monitor  Thrombocytopenia -Questionable cause.  Monitor.  Rheumatoid arthritis -Continue sulfasalazine and hydroxychloroquine.  Outpatient follow-up with rheumatology  History of erythromelalgia -Outpatient follow-up  Morbid obesity -Outpatient follow-up    DVT prophylaxis: SCDs Code Status: Full Family Communication: Husband at bedside Disposition Plan: Status is:  Inpatient Remains inpatient appropriate because: Of severity of illness    Consultants: GYN oncology  Procedures: None  Antimicrobials: None   Subjective: Patient seen and examined at bedside. No fever or vomiting reported. No chest pain or shortness of breath reported.    Objective: Vitals:   01/19/23 1322 01/19/23 1418 01/19/23 2111 01/20/23 0514  BP: (!) 151/58 (!) 143/74 (!) 159/54 (!) 154/64  Pulse: 84 88 94 86  Resp: 20 18 18 18   Temp: 99.3 F (37.4 C) 98.9 F (37.2 C) 99 F (37.2 C) 97.9 F (36.6 C)  TempSrc: Oral Oral Oral Oral  SpO2: 94% 96% 97% 92%  Weight:      Height:        Intake/Output Summary (Last 24 hours) at 01/20/2023 1250 Last data filed at 01/20/2023 0900 Gross per 24 hour  Intake 757 ml  Output 1600 ml  Net -843 ml   Filed Weights   01/18/23 1420  Weight: 117.9 kg    Examination:  General: Currently on room air.  No distress ENT/neck: No thyromegaly.  JVD is not elevated  respiratory: Decreased breath sounds at bases bilaterally with some crackles; no wheezing CVS: S1-S2 heard, rate controlled currently Abdominal: Soft, morbidly obese, nontender, slightly distended; no organomegaly, bowel sounds are heard Extremities: Trace lower extremity edema; no cyanosis  CNS: Awake and alert.  No focal neurologic deficit.  Moves extremities Lymph: No obvious lymphadenopathy Skin: No obvious ecchymosis/lesions  psych: looks slightly anxious. Not agitated musculoskeletal: No obvious joint swelling/deformity     Data Reviewed: I have personally reviewed following labs and imaging studies  CBC: Recent Labs  Lab 01/17/23 1439 01/18/23 1521 01/19/23 0544 01/19/23 1700 01/20/23 0547  WBC 7.9 5.6 5.2  --  5.9  NEUTROABS 5.4  --   --   --  4.5  HGB 8.9* 7.9* 7.7* 9.6* 8.4*  HCT 26.4* 23.5* 23.8* 30.3* 26.0*  MCV 87.1 86.7 89.8  --  90.9  PLT 217 212 107*  --  183    Basic Metabolic Panel: Recent Labs  Lab 01/18/23 1521 01/19/23 0544  01/20/23 0547  NA 137 136 133*  K 3.8 3.8 3.8  CL 99 102 100  CO2 29 29 28   GLUCOSE 152* 114* 111*  BUN 12 14 12   CREATININE 1.03* 1.06* 1.03*  CALCIUM 8.7* 8.5* 8.4*  MG  --  1.9 2.0  PHOS  --  4.3  --     GFR: Estimated Creatinine Clearance: 62.6 mL/min (A) (by C-G formula based on SCr of 1.03 mg/dL (H)). Liver Function Tests: Recent Labs  Lab 01/19/23 0544  AST 14*  ALT 14  ALKPHOS 50  BILITOT 0.4  PROT 5.7*  ALBUMIN 3.1*    No results for input(s): "LIPASE", "AMYLASE" in the last 168 hours. No results for input(s): "AMMONIA" in the last 168 hours. Coagulation Profile: No results for input(s): "INR", "PROTIME" in the last 168 hours. Cardiac Enzymes: No results for input(s): "CKTOTAL", "CKMB", "CKMBINDEX", "TROPONINI" in the last 168 hours. BNP (last 3 results) No results for input(s): "PROBNP" in the last 8760 hours. HbA1C: No results for input(s): "HGBA1C" in the last 72 hours. CBG: No results for input(s): "GLUCAP" in the last 168 hours. Lipid Profile: No results for input(s): "CHOL", "HDL", "LDLCALC", "TRIG", "CHOLHDL", "LDLDIRECT" in the last 72 hours. Thyroid Function Tests: No results for input(s): "TSH", "T4TOTAL", "FREET4", "T3FREE", "THYROIDAB" in the last 72 hours. Anemia Panel: No results for input(s): "VITAMINB12", "FOLATE", "FERRITIN", "TIBC", "IRON", "RETICCTPCT" in the last 72 hours. Sepsis Labs: No results for input(s): "PROCALCITON", "LATICACIDVEN" in the last 168 hours.  No results found for this or any previous visit (from the past 240 hour(s)).       Radiology Studies: No results found.      Scheduled Meds:  sodium chloride   Intravenous Once   ascorbic acid  500 mg Oral Weekly   cholecalciferol  5,000 Units Oral Daily   furosemide  60 mg Oral Daily   gabapentin  200 mg Oral BID   hydroxychloroquine  200 mg Oral BID WC   metoprolol succinate  50 mg Oral BID   multivitamin with minerals  1 tablet Oral Daily   Muscle Rub    Topical QHS   sulfaSALAzine  1,000 mg Oral BID   Continuous Infusions:        Glade Lloyd, MD [facility] 01/20/2023, 12:50 PM

## 2023-01-21 ENCOUNTER — Other Ambulatory Visit: Payer: Self-pay

## 2023-01-21 ENCOUNTER — Ambulatory Visit: Payer: Medicare Other

## 2023-01-21 ENCOUNTER — Telehealth: Payer: Self-pay

## 2023-01-21 ENCOUNTER — Ambulatory Visit
Admission: RE | Admit: 2023-01-21 | Discharge: 2023-01-21 | Disposition: A | Discharge status: Home / Self Care | Payer: Medicare Other | Source: Ambulatory Visit | Attending: Radiation Oncology | Admitting: Radiation Oncology

## 2023-01-21 DIAGNOSIS — Z510 Encounter for antineoplastic radiation therapy: Secondary | ICD-10-CM | POA: Diagnosis not present

## 2023-01-21 DIAGNOSIS — C538 Malignant neoplasm of overlapping sites of cervix uteri: Secondary | ICD-10-CM | POA: Diagnosis present

## 2023-01-21 LAB — RAD ONC ARIA SESSION SUMMARY
Course Elapsed Days: 7
Plan Fractions Treated to Date: 7
Plan Prescribed Dose Per Fraction: 1.8 Gy
Plan Total Fractions Prescribed: 25
Plan Total Prescribed Dose: 45 Gy
Reference Point Dosage Given to Date: 12.6 Gy
Reference Point Session Dosage Given: 1.8 Gy
Session Number: 7

## 2023-01-21 LAB — TYPE AND SCREEN

## 2023-01-21 LAB — BPAM RBC
Blood Product Expiration Date: 202406202359
Blood Product Expiration Date: 202406212359
ISSUE DATE / TIME: 202406021126
ISSUE DATE / TIME: 202406031316
Unit Type and Rh: 6200
Unit Type and Rh: 6200

## 2023-01-21 NOTE — Transitions of Care (Post Inpatient/ED Visit) (Signed)
   01/21/2023  Name: [name] MRN: [id number] DOB: 1951-03-27  Today's TOC FU Call Status: Today's TOC FU Call Status:: Unsuccessul Call (1st Attempt) Unsuccessful Call (1st Attempt) Date: 01/21/23   Attempted to reach the patient regarding the most recent Inpatient/ED visit.  Follow Up Plan: Additional outreach attempts will be made to reach the patient to complete the Transitions of Care (Post Inpatient/ED visit) call.     Antionette Fairy, RN,BSN,CCM [facility] Care Management Care Management Community Coordinator Direct Phone: [phone] Toll Free: [phone] Fax: [phone]

## 2023-01-22 ENCOUNTER — Telehealth: Payer: Self-pay

## 2023-01-22 ENCOUNTER — Ambulatory Visit: Payer: Medicare Other

## 2023-01-22 ENCOUNTER — Other Ambulatory Visit: Payer: Self-pay

## 2023-01-22 ENCOUNTER — Ambulatory Visit
Admission: RE | Admit: 2023-01-22 | Discharge: 2023-01-22 | Disposition: A | Discharge status: Home / Self Care | Payer: Medicare Other | Source: Ambulatory Visit | Attending: Radiation Oncology | Admitting: Radiation Oncology

## 2023-01-22 DIAGNOSIS — C538 Malignant neoplasm of overlapping sites of cervix uteri: Secondary | ICD-10-CM | POA: Diagnosis not present

## 2023-01-22 DIAGNOSIS — Z510 Encounter for antineoplastic radiation therapy: Secondary | ICD-10-CM | POA: Diagnosis not present

## 2023-01-22 LAB — RAD ONC ARIA SESSION SUMMARY
Course Elapsed Days: 8
Plan Fractions Treated to Date: 8
Plan Prescribed Dose Per Fraction: 1.8 Gy
Plan Total Fractions Prescribed: 25
Plan Total Prescribed Dose: 45 Gy
Reference Point Dosage Given to Date: 14.4 Gy
Reference Point Session Dosage Given: 1.8 Gy
Session Number: 8

## 2023-01-22 NOTE — Transitions of Care (Post Inpatient/ED Visit) (Signed)
01/22/2023  Name: [name] MRN: [id number] DOB: Sep 24, 1950  Today's TOC FU Call Status: Today's TOC FU Call Status:: Successful TOC FU Call Competed TOC FU Call Complete Date: 01/22/23  Transition Care Management Follow-up Telephone Call Date of Discharge: 01/20/23 Discharge Facility: [facility]) Type of Discharge: Inpatient Admission Primary Inpatient Discharge Diagnosis:: "symptomatic anemia,abd gas pain" How have you been since you were released from the hospital?: Same (Pt states she is "doing about the same-still has some weakness and nausea." Appetite fair-ate eggs for breakfast. LBM this AM.) Any questions or concerns?: No  Items Reviewed: Did you receive and understand the discharge instructions provided?: Yes Medications obtained,verified, and reconciled?: Partial Review Completed Reason for Partial Mediation Review: brief call-pt had to get to oncology appt Any new allergies since your discharge?: No Dietary orders reviewed?: Yes Type of Diet Ordered:: low salt/heart healthy Do you have support at home?: Yes People in Home: spouse  Medications Reviewed Today: Medications Reviewed Today     Reviewed by Charlyn Minerva, RN (Registered Nurse) on 01/22/23 at 1011  Med List Status: <None>   Medication Order Taking? Sig Documenting Provider Last Dose Status Informant  acetaminophen (TYLENOL) 650 MG CR tablet [id number]  Take 1,300 mg by mouth every 8 (eight) hours as needed for pain. [provider]  Active Self  amoxicillin (AMOXIL) 500 MG capsule [id number]  Take 4 capsules (2,000 mg total) by mouth as directed.  Patient not taking: Reported on 01/18/2023   Filbert Schilder, NP  Active Self  BIOTIN PO [id number]  Take 1 tablet by mouth at bedtime. [provider]  Active Self  Cholecalciferol (VITAMIN D) 125 MCG (5000 UT) CAPS [id number]  Take 5,000 Units by mouth in the morning. [provider]  Active Self  ferrous sulfate  325 (65 FE) MG tablet [id number]  Take 1 tablet (325 mg total) by mouth daily. Ernie Avena, MD  Active Self  fluticasone Aleda Grana) 50 MCG/ACT nasal spray [id number]  Place 1-2 sprays into both nostrils daily as needed for allergies or rhinitis. [provider]  Active Self  furosemide (LASIX) 20 MG tablet [id number]  Take 2 - 3 tablets (40 - 60 mg total) by mouth in the morning and at bedtime.  Patient taking differently: Take 40-60 mg by mouth in the morning and at bedtime. Take 3 tablets (60 mg) in the morning and Take 2 tablets (40 mg) at bedtime   [name], MD  Active Self  gabapentin (NEURONTIN) 100 MG capsule [id number]  TAKE 3 CAPSULES BY MOUTH IN THE MORNING AND 2 CAPSULES AT BEDTIME. OK TO TAKE EXTRA CAPSULE AT BEDTIME AS NEEDED Nita Sickle K, DO  Active Self  hydroxychloroquine (PLAQUENIL) 200 MG tablet [id number]  Take 1 tablet (200 mg total) by mouth 2 (two) times daily with food or milk   Active Self  hydroxypropyl methylcellulose / hypromellose (ISOPTO TEARS / GONIOVISC) 2.5 % ophthalmic solution [id number]  Place 1 drop into both eyes in the morning. [provider]  Active Self  losartan (COZAAR) 100 MG tablet [id number]  TAKE 1 TABLET BY MOUTH ONCE A DAY Nafziger, Cory, NP  Active Self  Menthol, Topical Analgesic, (BIOFREEZE) 4 % GEL [id number]  Apply 1 application  topically at bedtime. Neuropathy feet/ankles [provider]  Active Self  metoprolol succinate (TOPROL-XL) 50 MG 24 hr tablet [id number]  Take 1 tablet (50 mg total) by mouth 2 (two) times daily. [name], MD  Active Self  Multiple Vitamins-Minerals (MULTIVITAMIN WITH MINERALS) tablet [id number]  Take 2 tablets by mouth daily. [provider]  Active Self           Med Note Bonna Gains I   Sat Jan 18, 2023 10:07 PM)    potassium chloride SA (KLOR-CON M) 20 MEQ tablet [id number]  Take 1 tablet by mouth 2 times daily. [name], MD  Active Self   prochlorperazine (COMPAZINE) 10 MG tablet [id number] Yes Take 1 tablet (10 mg) by mouth every 6 hours as needed for nausea or vomiting. Antony Blackbird, MD Taking Active Self  sulfaSALAzine (AZULFIDINE) 500 MG tablet [id number]  Take 2 tablets (1,000 mg total) by mouth 2 (two) times daily.   Active Self  vitamin C (ASCORBIC ACID) 500 MG tablet [id number]  Take 500 mg by mouth once a week. [provider]  Active Self            Home Care and Equipment/Supplies: Were Home Health Services Ordered?: NA Any new equipment or medical supplies ordered?: NA  Functional Questionnaire: Do you need assistance with bathing/showering or dressing?: No Do you need assistance with meal preparation?: No Do you need assistance with eating?: No Do you have difficulty maintaining continence: No Do you need assistance with getting out of bed/getting out of a chair/moving?: No Do you have difficulty managing or taking your medications?: No  Follow up appointments reviewed: PCP Follow-up appointment confirmed?: No (Pt declined-states she will make follow up on her own-prefers to f/u with oncology) MD Provider Line [phone] Given: No Specialist Hospital Follow-up appointment confirmed?: No Reason Specialist Follow-Up Not Confirmed: Patient has Specialist Provider Number and will Call for Appointment (reviewed with pt per d/c instructions to f/u with Dr. Renella Cunas) Do you need transportation to your follow-up appointment?: No (pt confirms spouse takes her to appts) Do you understand care options if your condition(s) worsen?: Yes-patient verbalized understanding  SDOH Interventions Today    Flowsheet Row Most Recent Value  SDOH Interventions   Transportation Interventions Intervention Not Indicated      TOC Interventions Today    Flowsheet Row Most Recent Value  TOC Interventions   TOC Interventions Discussed/Reviewed TOC Interventions Discussed      Interventions Today     Flowsheet Row Most Recent Value  General Interventions   General Interventions Discussed/Reviewed General Interventions Discussed, Doctor Visits  Doctor Visits Discussed/Reviewed Doctor Visits Discussed, Specialist, PCP  PCP/Specialist Visits Compliance with follow-up visit  Education Interventions   Education Provided Provided Education  Provided Verbal Education On Nutrition, When to see the doctor, Medication  Nutrition Interventions   Nutrition Discussed/Reviewed Nutrition Discussed  Pharmacy Interventions   Pharmacy Dicussed/Reviewed Pharmacy Topics Discussed, Medications and their functions  Safety Interventions   Safety Discussed/Reviewed Safety Discussed       Alessandra Grout [facility] Care Management Care Management Community Coordinator Direct Phone: [phone] Toll Free: [phone] Fax: [phone]

## 2023-01-23 ENCOUNTER — Ambulatory Visit: Payer: Medicare Other

## 2023-01-23 ENCOUNTER — Ambulatory Visit
Admission: RE | Admit: 2023-01-23 | Discharge: 2023-01-23 | Disposition: A | Discharge status: Home / Self Care | Payer: Medicare Other | Source: Ambulatory Visit | Attending: Radiation Oncology | Admitting: Radiation Oncology

## 2023-01-23 ENCOUNTER — Other Ambulatory Visit: Payer: Self-pay

## 2023-01-23 DIAGNOSIS — Z510 Encounter for antineoplastic radiation therapy: Secondary | ICD-10-CM | POA: Diagnosis not present

## 2023-01-23 DIAGNOSIS — C538 Malignant neoplasm of overlapping sites of cervix uteri: Secondary | ICD-10-CM | POA: Diagnosis not present

## 2023-01-23 LAB — RAD ONC ARIA SESSION SUMMARY
Course Elapsed Days: 9
Plan Fractions Treated to Date: 9
Plan Prescribed Dose Per Fraction: 1.8 Gy
Plan Total Fractions Prescribed: 25
Plan Total Prescribed Dose: 45 Gy
Reference Point Dosage Given to Date: 16.2 Gy
Reference Point Session Dosage Given: 1.8 Gy
Session Number: 9

## 2023-01-24 ENCOUNTER — Ambulatory Visit
Admission: RE | Admit: 2023-01-24 | Discharge: 2023-01-24 | Disposition: A | Discharge status: Home / Self Care | Payer: Medicare Other | Source: Ambulatory Visit | Attending: Radiation Oncology | Admitting: Radiation Oncology

## 2023-01-24 ENCOUNTER — Other Ambulatory Visit: Payer: Self-pay

## 2023-01-24 ENCOUNTER — Ambulatory Visit: Payer: Medicare Other

## 2023-01-24 DIAGNOSIS — Z510 Encounter for antineoplastic radiation therapy: Secondary | ICD-10-CM | POA: Diagnosis not present

## 2023-01-24 DIAGNOSIS — C538 Malignant neoplasm of overlapping sites of cervix uteri: Secondary | ICD-10-CM | POA: Diagnosis not present

## 2023-01-24 LAB — RAD ONC ARIA SESSION SUMMARY
Course Elapsed Days: 10
Plan Fractions Treated to Date: 10
Plan Prescribed Dose Per Fraction: 1.8 Gy
Plan Total Fractions Prescribed: 25
Plan Total Prescribed Dose: 45 Gy
Reference Point Dosage Given to Date: 18 Gy
Reference Point Session Dosage Given: 1.8 Gy
Session Number: 10

## 2023-01-27 ENCOUNTER — Ambulatory Visit
Admission: RE | Admit: 2023-01-27 | Discharge: 2023-01-27 | Disposition: A | Discharge status: Home / Self Care | Payer: Medicare Other | Source: Ambulatory Visit | Attending: Radiation Oncology | Admitting: Radiation Oncology

## 2023-01-27 ENCOUNTER — Other Ambulatory Visit: Payer: Self-pay

## 2023-01-27 ENCOUNTER — Ambulatory Visit: Payer: Medicare Other

## 2023-01-27 DIAGNOSIS — C538 Malignant neoplasm of overlapping sites of cervix uteri: Secondary | ICD-10-CM

## 2023-01-27 DIAGNOSIS — Z510 Encounter for antineoplastic radiation therapy: Secondary | ICD-10-CM | POA: Diagnosis not present

## 2023-01-27 LAB — CBC WITH DIFFERENTIAL (CANCER CENTER ONLY)
Abs Immature Granulocytes: 0.02 10*3/uL (ref 0.00–0.07)
Basophils Absolute: 0 10*3/uL (ref 0.0–0.1)
Basophils Relative: 1 %
Eosinophils Absolute: 0.2 10*3/uL (ref 0.0–0.5)
Eosinophils Relative: 5 %
HCT: 32 % — ABNORMAL LOW (ref 36.0–46.0)
Hemoglobin: 10.7 g/dL — ABNORMAL LOW (ref 12.0–15.0)
Immature Granulocytes: 1 %
Lymphocytes Relative: 13 %
Lymphs Abs: 0.4 10*3/uL — ABNORMAL LOW (ref 0.7–4.0)
MCH: 29.7 pg (ref 26.0–34.0)
MCHC: 33.4 g/dL (ref 30.0–36.0)
MCV: 88.9 fL (ref 80.0–100.0)
Monocytes Absolute: 0.3 10*3/uL (ref 0.1–1.0)
Monocytes Relative: 10 %
Neutro Abs: 2.3 10*3/uL (ref 1.7–7.7)
Neutrophils Relative %: 70 %
Platelet Count: 171 10*3/uL (ref 150–400)
RBC: 3.6 MIL/uL — ABNORMAL LOW (ref 3.87–5.11)
RDW: 14.3 % (ref 11.5–15.5)
WBC Count: 3.3 10*3/uL — ABNORMAL LOW (ref 4.0–10.5)
nRBC: 0 % (ref 0.0–0.2)

## 2023-01-27 LAB — RAD ONC ARIA SESSION SUMMARY
Course Elapsed Days: 13
Plan Fractions Treated to Date: 11
Plan Prescribed Dose Per Fraction: 1.8 Gy
Plan Total Fractions Prescribed: 25
Plan Total Prescribed Dose: 45 Gy
Reference Point Dosage Given to Date: 19.8 Gy
Reference Point Session Dosage Given: 1.8 Gy
Session Number: 11

## 2023-01-27 LAB — BASIC METABOLIC PANEL - CANCER CENTER ONLY
Anion gap: 9 (ref 5–15)
BUN: 17 mg/dL (ref 8–23)
CO2: 29 mmol/L (ref 22–32)
Calcium: 9.9 mg/dL (ref 8.9–10.3)
Chloride: 101 mmol/L (ref 98–111)
Creatinine: 0.97 mg/dL (ref 0.44–1.00)
GFR, Estimated: >60 mL/min (ref >60)
Glucose, Bld: 121 mg/dL — ABNORMAL HIGH (ref 70–99)
Potassium: 3.7 mmol/L (ref 3.5–5.1)
Sodium: 139 mmol/L (ref 135–145)

## 2023-01-28 ENCOUNTER — Ambulatory Visit
Admission: RE | Admit: 2023-01-28 | Discharge: 2023-01-28 | Disposition: A | Discharge status: Home / Self Care | Payer: Medicare Other | Source: Ambulatory Visit | Attending: Radiation Oncology | Admitting: Radiation Oncology

## 2023-01-28 ENCOUNTER — Other Ambulatory Visit: Payer: Self-pay

## 2023-01-28 ENCOUNTER — Ambulatory Visit: Payer: Medicare Other

## 2023-01-28 DIAGNOSIS — C538 Malignant neoplasm of overlapping sites of cervix uteri: Secondary | ICD-10-CM | POA: Diagnosis not present

## 2023-01-28 DIAGNOSIS — Z510 Encounter for antineoplastic radiation therapy: Secondary | ICD-10-CM | POA: Diagnosis not present

## 2023-01-28 LAB — RAD ONC ARIA SESSION SUMMARY
Course Elapsed Days: 14
Plan Fractions Treated to Date: 12
Plan Prescribed Dose Per Fraction: 1.8 Gy
Plan Total Fractions Prescribed: 25
Plan Total Prescribed Dose: 45 Gy
Reference Point Dosage Given to Date: 21.6 Gy
Reference Point Session Dosage Given: 1.8 Gy
Session Number: 12

## 2023-01-29 ENCOUNTER — Other Ambulatory Visit: Payer: Self-pay

## 2023-01-29 ENCOUNTER — Ambulatory Visit: Payer: Medicare Other

## 2023-01-29 ENCOUNTER — Ambulatory Visit
Admission: RE | Admit: 2023-01-29 | Discharge: 2023-01-29 | Disposition: A | Discharge status: Home / Self Care | Payer: Medicare Other | Source: Ambulatory Visit | Attending: Radiation Oncology | Admitting: Radiation Oncology

## 2023-01-29 DIAGNOSIS — Z510 Encounter for antineoplastic radiation therapy: Secondary | ICD-10-CM | POA: Diagnosis not present

## 2023-01-29 DIAGNOSIS — C538 Malignant neoplasm of overlapping sites of cervix uteri: Secondary | ICD-10-CM | POA: Diagnosis not present

## 2023-01-29 LAB — RAD ONC ARIA SESSION SUMMARY
Course Elapsed Days: 15
Plan Fractions Treated to Date: 13
Plan Prescribed Dose Per Fraction: 1.8 Gy
Plan Total Fractions Prescribed: 25
Plan Total Prescribed Dose: 45 Gy
Reference Point Dosage Given to Date: 23.4 Gy
Reference Point Session Dosage Given: 1.8 Gy
Session Number: 13

## 2023-01-30 ENCOUNTER — Ambulatory Visit: Payer: Medicare Other

## 2023-01-30 ENCOUNTER — Other Ambulatory Visit: Payer: Self-pay

## 2023-01-30 ENCOUNTER — Ambulatory Visit
Admission: RE | Admit: 2023-01-30 | Discharge: 2023-01-30 | Disposition: A | Discharge status: Home / Self Care | Payer: Medicare Other | Source: Ambulatory Visit | Attending: Radiation Oncology | Admitting: Radiation Oncology

## 2023-01-30 DIAGNOSIS — C538 Malignant neoplasm of overlapping sites of cervix uteri: Secondary | ICD-10-CM | POA: Diagnosis not present

## 2023-01-30 DIAGNOSIS — Z510 Encounter for antineoplastic radiation therapy: Secondary | ICD-10-CM | POA: Diagnosis not present

## 2023-01-30 LAB — RAD ONC ARIA SESSION SUMMARY
Course Elapsed Days: 16
Plan Fractions Treated to Date: 14
Plan Prescribed Dose Per Fraction: 1.8 Gy
Plan Total Fractions Prescribed: 25
Plan Total Prescribed Dose: 45 Gy
Reference Point Dosage Given to Date: 25.2 Gy
Reference Point Session Dosage Given: 1.8 Gy
Session Number: 14

## 2023-01-31 ENCOUNTER — Ambulatory Visit: Payer: Medicare Other

## 2023-01-31 ENCOUNTER — Other Ambulatory Visit: Payer: Self-pay

## 2023-01-31 ENCOUNTER — Ambulatory Visit
Admission: RE | Admit: 2023-01-31 | Discharge: 2023-01-31 | Disposition: A | Discharge status: Home / Self Care | Payer: Medicare Other | Source: Ambulatory Visit | Attending: Radiation Oncology | Admitting: Radiation Oncology

## 2023-01-31 ENCOUNTER — Other Ambulatory Visit: Payer: Self-pay | Admitting: Radiation Oncology

## 2023-01-31 ENCOUNTER — Other Ambulatory Visit: Payer: Self-pay | Admitting: Adult Health

## 2023-01-31 ENCOUNTER — Other Ambulatory Visit (HOSPITAL_COMMUNITY): Payer: Self-pay

## 2023-01-31 DIAGNOSIS — Z510 Encounter for antineoplastic radiation therapy: Secondary | ICD-10-CM | POA: Diagnosis not present

## 2023-01-31 DIAGNOSIS — C538 Malignant neoplasm of overlapping sites of cervix uteri: Secondary | ICD-10-CM | POA: Diagnosis not present

## 2023-01-31 LAB — RAD ONC ARIA SESSION SUMMARY
Course Elapsed Days: 17
Plan Fractions Treated to Date: 15
Plan Prescribed Dose Per Fraction: 1.8 Gy
Plan Total Fractions Prescribed: 25
Plan Total Prescribed Dose: 45 Gy
Reference Point Dosage Given to Date: 27 Gy
Reference Point Session Dosage Given: 1.8 Gy
Session Number: 15

## 2023-01-31 MED ORDER — OXYCODONE-ACETAMINOPHEN 5-325 MG PO TABS
1.0000 | ORAL_TABLET | Freq: Four times a day (QID) | ORAL | 0 refills | Status: DC | PRN
  Filled 2023-01-31: qty 30, 8d supply, fill #0

## 2023-02-03 ENCOUNTER — Other Ambulatory Visit: Payer: Self-pay

## 2023-02-03 ENCOUNTER — Ambulatory Visit: Payer: Medicare Other

## 2023-02-03 ENCOUNTER — Ambulatory Visit
Admission: RE | Admit: 2023-02-03 | Discharge: 2023-02-03 | Disposition: A | Discharge status: Home / Self Care | Payer: Medicare Other | Source: Ambulatory Visit | Attending: Radiation Oncology | Admitting: Radiation Oncology

## 2023-02-03 ENCOUNTER — Other Ambulatory Visit (HOSPITAL_COMMUNITY): Payer: Self-pay

## 2023-02-03 DIAGNOSIS — C538 Malignant neoplasm of overlapping sites of cervix uteri: Secondary | ICD-10-CM | POA: Diagnosis not present

## 2023-02-03 DIAGNOSIS — Z510 Encounter for antineoplastic radiation therapy: Secondary | ICD-10-CM | POA: Diagnosis not present

## 2023-02-03 LAB — RAD ONC ARIA SESSION SUMMARY
Course Elapsed Days: 20
Plan Fractions Treated to Date: 16
Plan Prescribed Dose Per Fraction: 1.8 Gy
Plan Total Fractions Prescribed: 25
Plan Total Prescribed Dose: 45 Gy
Reference Point Dosage Given to Date: 28.8 Gy
Reference Point Session Dosage Given: 1.8 Gy
Session Number: 16

## 2023-02-04 ENCOUNTER — Ambulatory Visit
Admission: RE | Admit: 2023-02-04 | Discharge: 2023-02-04 | Disposition: A | Discharge status: Home / Self Care | Payer: Medicare Other | Source: Ambulatory Visit | Attending: Radiation Oncology | Admitting: Radiation Oncology

## 2023-02-04 ENCOUNTER — Other Ambulatory Visit: Payer: Self-pay | Admitting: Family Medicine

## 2023-02-04 ENCOUNTER — Ambulatory Visit: Payer: Medicare Other

## 2023-02-04 ENCOUNTER — Other Ambulatory Visit: Payer: Self-pay

## 2023-02-04 ENCOUNTER — Other Ambulatory Visit (HOSPITAL_COMMUNITY): Payer: Self-pay

## 2023-02-04 DIAGNOSIS — C538 Malignant neoplasm of overlapping sites of cervix uteri: Secondary | ICD-10-CM | POA: Diagnosis not present

## 2023-02-04 DIAGNOSIS — Z510 Encounter for antineoplastic radiation therapy: Secondary | ICD-10-CM | POA: Diagnosis not present

## 2023-02-04 LAB — RAD ONC ARIA SESSION SUMMARY
Course Elapsed Days: 21
Plan Fractions Treated to Date: 17
Plan Prescribed Dose Per Fraction: 1.8 Gy
Plan Total Fractions Prescribed: 25
Plan Total Prescribed Dose: 45 Gy
Reference Point Dosage Given to Date: 30.6 Gy
Reference Point Session Dosage Given: 1.8 Gy
Session Number: 17

## 2023-02-04 LAB — CBC WITH DIFFERENTIAL (CANCER CENTER ONLY)
Abs Immature Granulocytes: 0.01 10*3/uL (ref 0.00–0.07)
Basophils Absolute: 0 10*3/uL (ref 0.0–0.1)
Basophils Relative: 1 %
Eosinophils Absolute: 0.1 10*3/uL (ref 0.0–0.5)
Eosinophils Relative: 3 %
HCT: 35.4 % — ABNORMAL LOW (ref 36.0–46.0)
Hemoglobin: 11.8 g/dL — ABNORMAL LOW (ref 12.0–15.0)
Immature Granulocytes: 0 %
Lymphocytes Relative: 11 %
Lymphs Abs: 0.4 10*3/uL — ABNORMAL LOW (ref 0.7–4.0)
MCH: 29.6 pg (ref 26.0–34.0)
MCHC: 33.3 g/dL (ref 30.0–36.0)
MCV: 88.9 fL (ref 80.0–100.0)
Monocytes Absolute: 0.4 10*3/uL (ref 0.1–1.0)
Monocytes Relative: 11 %
Neutro Abs: 3 10*3/uL (ref 1.7–7.7)
Neutrophils Relative %: 74 %
Platelet Count: 176 10*3/uL (ref 150–400)
RBC: 3.98 MIL/uL (ref 3.87–5.11)
RDW: 14.2 % (ref 11.5–15.5)
WBC Count: 4 10*3/uL (ref 4.0–10.5)
nRBC: 0 % (ref 0.0–0.2)

## 2023-02-04 LAB — BASIC METABOLIC PANEL - CANCER CENTER ONLY
Anion gap: 8 (ref 5–15)
BUN: 12 mg/dL (ref 8–23)
CO2: 31 mmol/L (ref 22–32)
Calcium: 9.7 mg/dL (ref 8.9–10.3)
Chloride: 100 mmol/L (ref 98–111)
Creatinine: 0.97 mg/dL (ref 0.44–1.00)
GFR, Estimated: >60 mL/min (ref >60)
Glucose, Bld: 116 mg/dL — ABNORMAL HIGH (ref 70–99)
Potassium: 3.6 mmol/L (ref 3.5–5.1)
Sodium: 139 mmol/L (ref 135–145)

## 2023-02-04 MED ORDER — LOSARTAN POTASSIUM 100 MG PO TABS
100.0000 mg | ORAL_TABLET | Freq: Every day | ORAL | 1 refills | Status: DC
  Filled 2023-02-04: qty 90, 90d supply, fill #0
  Filled 2023-04-22: qty 90, 90d supply, fill #1

## 2023-02-05 ENCOUNTER — Ambulatory Visit
Admission: RE | Admit: 2023-02-05 | Discharge: 2023-02-05 | Disposition: A | Discharge status: Home / Self Care | Payer: Medicare Other | Source: Ambulatory Visit | Attending: Radiation Oncology | Admitting: Radiation Oncology

## 2023-02-05 ENCOUNTER — Ambulatory Visit: Payer: Medicare Other

## 2023-02-05 ENCOUNTER — Other Ambulatory Visit: Payer: Self-pay

## 2023-02-05 DIAGNOSIS — Z510 Encounter for antineoplastic radiation therapy: Secondary | ICD-10-CM | POA: Diagnosis not present

## 2023-02-05 DIAGNOSIS — C538 Malignant neoplasm of overlapping sites of cervix uteri: Secondary | ICD-10-CM | POA: Diagnosis not present

## 2023-02-05 LAB — RAD ONC ARIA SESSION SUMMARY
Course Elapsed Days: 22
Plan Fractions Treated to Date: 18
Plan Prescribed Dose Per Fraction: 1.8 Gy
Plan Total Fractions Prescribed: 25
Plan Total Prescribed Dose: 45 Gy
Reference Point Dosage Given to Date: 32.4 Gy
Reference Point Session Dosage Given: 1.8 Gy
Session Number: 18

## 2023-02-06 ENCOUNTER — Other Ambulatory Visit: Payer: Self-pay

## 2023-02-06 ENCOUNTER — Ambulatory Visit: Payer: Medicare Other

## 2023-02-06 ENCOUNTER — Ambulatory Visit
Admission: RE | Admit: 2023-02-06 | Discharge: 2023-02-06 | Disposition: A | Discharge status: Home / Self Care | Payer: Medicare Other | Source: Ambulatory Visit | Attending: Radiation Oncology | Admitting: Radiation Oncology

## 2023-02-06 ENCOUNTER — Other Ambulatory Visit (HOSPITAL_COMMUNITY): Payer: Self-pay | Admitting: Radiation Oncology

## 2023-02-06 DIAGNOSIS — C538 Malignant neoplasm of overlapping sites of cervix uteri: Secondary | ICD-10-CM | POA: Diagnosis not present

## 2023-02-06 DIAGNOSIS — C539 Malignant neoplasm of cervix uteri, unspecified: Secondary | ICD-10-CM

## 2023-02-06 DIAGNOSIS — Z510 Encounter for antineoplastic radiation therapy: Secondary | ICD-10-CM | POA: Diagnosis not present

## 2023-02-06 LAB — RAD ONC ARIA SESSION SUMMARY
Course Elapsed Days: 23
Plan Fractions Treated to Date: 19
Plan Prescribed Dose Per Fraction: 1.8 Gy
Plan Total Fractions Prescribed: 25
Plan Total Prescribed Dose: 45 Gy
Reference Point Dosage Given to Date: 34.2 Gy
Reference Point Session Dosage Given: 1.8 Gy
Session Number: 19

## 2023-02-07 ENCOUNTER — Other Ambulatory Visit: Payer: Self-pay

## 2023-02-07 ENCOUNTER — Ambulatory Visit
Admission: RE | Admit: 2023-02-07 | Discharge: 2023-02-07 | Disposition: A | Discharge status: Home / Self Care | Payer: Medicare Other | Source: Ambulatory Visit | Attending: Radiation Oncology | Admitting: Radiation Oncology

## 2023-02-07 ENCOUNTER — Other Ambulatory Visit (HOSPITAL_COMMUNITY): Payer: Self-pay | Admitting: Radiation Oncology

## 2023-02-07 ENCOUNTER — Ambulatory Visit: Payer: Medicare Other

## 2023-02-07 DIAGNOSIS — Z510 Encounter for antineoplastic radiation therapy: Secondary | ICD-10-CM | POA: Diagnosis not present

## 2023-02-07 DIAGNOSIS — C538 Malignant neoplasm of overlapping sites of cervix uteri: Secondary | ICD-10-CM | POA: Diagnosis not present

## 2023-02-07 DIAGNOSIS — C539 Malignant neoplasm of cervix uteri, unspecified: Secondary | ICD-10-CM

## 2023-02-07 LAB — RAD ONC ARIA SESSION SUMMARY
Course Elapsed Days: 24
Plan Fractions Treated to Date: 20
Plan Prescribed Dose Per Fraction: 1.8 Gy
Plan Total Fractions Prescribed: 25
Plan Total Prescribed Dose: 45 Gy
Reference Point Dosage Given to Date: 36 Gy
Reference Point Session Dosage Given: 1.8 Gy
Session Number: 20

## 2023-02-10 ENCOUNTER — Ambulatory Visit
Admission: RE | Admit: 2023-02-10 | Discharge: 2023-02-10 | Disposition: A | Discharge status: Home / Self Care | Payer: Medicare Other | Source: Ambulatory Visit | Attending: Radiation Oncology | Admitting: Radiation Oncology

## 2023-02-10 ENCOUNTER — Other Ambulatory Visit: Payer: Self-pay

## 2023-02-10 ENCOUNTER — Other Ambulatory Visit (HOSPITAL_COMMUNITY): Payer: Self-pay | Admitting: Radiation Oncology

## 2023-02-10 ENCOUNTER — Ambulatory Visit: Payer: Medicare Other

## 2023-02-10 DIAGNOSIS — Z510 Encounter for antineoplastic radiation therapy: Secondary | ICD-10-CM | POA: Diagnosis not present

## 2023-02-10 DIAGNOSIS — C538 Malignant neoplasm of overlapping sites of cervix uteri: Secondary | ICD-10-CM | POA: Diagnosis not present

## 2023-02-10 DIAGNOSIS — C539 Malignant neoplasm of cervix uteri, unspecified: Secondary | ICD-10-CM

## 2023-02-10 LAB — RAD ONC ARIA SESSION SUMMARY
Course Elapsed Days: 27
Plan Fractions Treated to Date: 21
Plan Prescribed Dose Per Fraction: 1.8 Gy
Plan Total Fractions Prescribed: 25
Plan Total Prescribed Dose: 45 Gy
Reference Point Dosage Given to Date: 37.8 Gy
Reference Point Session Dosage Given: 1.8 Gy
Session Number: 21

## 2023-02-11 ENCOUNTER — Ambulatory Visit
Admission: RE | Admit: 2023-02-11 | Discharge: 2023-02-11 | Disposition: A | Discharge status: Home / Self Care | Payer: Medicare Other | Source: Ambulatory Visit | Attending: Radiation Oncology | Admitting: Radiation Oncology

## 2023-02-11 ENCOUNTER — Other Ambulatory Visit: Payer: Self-pay

## 2023-02-11 ENCOUNTER — Ambulatory Visit: Payer: Medicare Other

## 2023-02-11 ENCOUNTER — Encounter (HOSPITAL_BASED_OUTPATIENT_CLINIC_OR_DEPARTMENT_OTHER): Payer: Self-pay | Admitting: Radiation Oncology

## 2023-02-11 DIAGNOSIS — Z510 Encounter for antineoplastic radiation therapy: Secondary | ICD-10-CM | POA: Diagnosis not present

## 2023-02-11 DIAGNOSIS — C538 Malignant neoplasm of overlapping sites of cervix uteri: Secondary | ICD-10-CM | POA: Diagnosis not present

## 2023-02-11 LAB — RAD ONC ARIA SESSION SUMMARY
Course Elapsed Days: 28
Plan Fractions Treated to Date: 22
Plan Prescribed Dose Per Fraction: 1.8 Gy
Plan Total Fractions Prescribed: 25
Plan Total Prescribed Dose: 45 Gy
Reference Point Dosage Given to Date: 39.6 Gy
Reference Point Session Dosage Given: 1.8 Gy
Session Number: 22

## 2023-02-12 ENCOUNTER — Ambulatory Visit
Admission: RE | Admit: 2023-02-12 | Discharge: 2023-02-12 | Disposition: A | Discharge status: Home / Self Care | Payer: Medicare Other | Source: Ambulatory Visit | Attending: Radiation Oncology | Admitting: Radiation Oncology

## 2023-02-12 ENCOUNTER — Encounter (HOSPITAL_BASED_OUTPATIENT_CLINIC_OR_DEPARTMENT_OTHER): Payer: Self-pay | Admitting: Radiation Oncology

## 2023-02-12 ENCOUNTER — Ambulatory Visit: Payer: Medicare Other

## 2023-02-12 ENCOUNTER — Other Ambulatory Visit: Payer: Self-pay

## 2023-02-12 ENCOUNTER — Encounter (HOSPITAL_COMMUNITY): Payer: Self-pay

## 2023-02-12 DIAGNOSIS — Z510 Encounter for antineoplastic radiation therapy: Secondary | ICD-10-CM | POA: Diagnosis not present

## 2023-02-12 DIAGNOSIS — C538 Malignant neoplasm of overlapping sites of cervix uteri: Secondary | ICD-10-CM | POA: Diagnosis not present

## 2023-02-12 LAB — RAD ONC ARIA SESSION SUMMARY
Course Elapsed Days: 29
Plan Fractions Treated to Date: 23
Plan Prescribed Dose Per Fraction: 1.8 Gy
Plan Total Fractions Prescribed: 25
Plan Total Prescribed Dose: 45 Gy
Reference Point Dosage Given to Date: 41.4 Gy
Reference Point Session Dosage Given: 1.8 Gy
Session Number: 23

## 2023-02-12 NOTE — Progress Notes (Signed)
Spoke w/ via phone for pre-op interview---pt Lab needs dos----  none             Lab results------cbc, with dif, bmp 02-04-2023 epic, echo tee  10-03-2022 epic, EKG 01-19-2023 epic COVID test -----patient states asymptomatic no test needed Arrive at -------1015 am 03-05-2023 NPO after MN NO Solid Food.  Clear liquids from MN until---915 am Med rec completed Medications to take morning of surgery -----gabapentin, metoprolol succinate Diabetic medication -----n/a Patient instructed no nail polish to be worn day of surgery Patient instructed to bring photo id and insurance card day of surgery Patient aware to have Driver (ride ) / caregiver    husband danny for 24 hours after surgery  Patient Special Instructions -----none Pre-Op special Instructions -----none Patient verbalized understanding of instructions that were given at this phone interview. Patient denies shortness of breath, chest pain, fever, cough at this phone interview.  [facility] cardiology jill mcdaniel 11-04-2022 epic

## 2023-02-13 ENCOUNTER — Other Ambulatory Visit: Payer: Self-pay

## 2023-02-13 ENCOUNTER — Ambulatory Visit: Payer: Medicare Other

## 2023-02-13 ENCOUNTER — Ambulatory Visit
Admission: RE | Admit: 2023-02-13 | Discharge: 2023-02-13 | Disposition: A | Discharge status: Home / Self Care | Payer: Medicare Other | Source: Ambulatory Visit | Attending: Radiation Oncology | Admitting: Radiation Oncology

## 2023-02-13 DIAGNOSIS — C538 Malignant neoplasm of overlapping sites of cervix uteri: Secondary | ICD-10-CM | POA: Diagnosis not present

## 2023-02-13 DIAGNOSIS — Z510 Encounter for antineoplastic radiation therapy: Secondary | ICD-10-CM | POA: Diagnosis not present

## 2023-02-13 LAB — RAD ONC ARIA SESSION SUMMARY
Course Elapsed Days: 30
Plan Fractions Treated to Date: 24
Plan Prescribed Dose Per Fraction: 1.8 Gy
Plan Total Fractions Prescribed: 25
Plan Total Prescribed Dose: 45 Gy
Reference Point Dosage Given to Date: 43.2 Gy
Reference Point Session Dosage Given: 1.8 Gy
Session Number: 24

## 2023-02-14 ENCOUNTER — Ambulatory Visit
Admission: RE | Admit: 2023-02-14 | Discharge: 2023-02-14 | Disposition: A | Discharge status: Home / Self Care | Payer: Medicare Other | Source: Ambulatory Visit | Attending: Radiation Oncology | Admitting: Radiation Oncology

## 2023-02-14 ENCOUNTER — Other Ambulatory Visit: Payer: Self-pay

## 2023-02-14 ENCOUNTER — Ambulatory Visit: Payer: Medicare Other

## 2023-02-14 DIAGNOSIS — C538 Malignant neoplasm of overlapping sites of cervix uteri: Secondary | ICD-10-CM | POA: Diagnosis not present

## 2023-02-14 DIAGNOSIS — Z510 Encounter for antineoplastic radiation therapy: Secondary | ICD-10-CM | POA: Diagnosis not present

## 2023-02-14 LAB — RAD ONC ARIA SESSION SUMMARY
Course Elapsed Days: 31
Plan Fractions Treated to Date: 25
Plan Prescribed Dose Per Fraction: 1.8 Gy
Plan Total Fractions Prescribed: 25
Plan Total Prescribed Dose: 45 Gy
Reference Point Dosage Given to Date: 45 Gy
Reference Point Session Dosage Given: 1.8 Gy
Session Number: 25

## 2023-02-16 DIAGNOSIS — Z510 Encounter for antineoplastic radiation therapy: Secondary | ICD-10-CM | POA: Diagnosis not present

## 2023-02-16 DIAGNOSIS — C538 Malignant neoplasm of overlapping sites of cervix uteri: Secondary | ICD-10-CM | POA: Diagnosis not present

## 2023-02-17 ENCOUNTER — Ambulatory Visit: Payer: Medicare Other

## 2023-02-17 ENCOUNTER — Other Ambulatory Visit: Payer: Self-pay

## 2023-02-17 DIAGNOSIS — Z510 Encounter for antineoplastic radiation therapy: Secondary | ICD-10-CM | POA: Diagnosis not present

## 2023-02-17 DIAGNOSIS — C538 Malignant neoplasm of overlapping sites of cervix uteri: Secondary | ICD-10-CM | POA: Insufficient documentation

## 2023-02-17 LAB — RAD ONC ARIA SESSION SUMMARY
Course Elapsed Days: 34
Plan Fractions Treated to Date: 1
Plan Prescribed Dose Per Fraction: 1.8 Gy
Plan Total Fractions Prescribed: 5
Plan Total Prescribed Dose: 9 Gy
Reference Point Dosage Given to Date: 1.8 Gy
Reference Point Session Dosage Given: 1.8 Gy
Session Number: 26

## 2023-02-18 ENCOUNTER — Ambulatory Visit: Payer: Medicare Other

## 2023-02-18 ENCOUNTER — Ambulatory Visit
Admission: RE | Admit: 2023-02-18 | Discharge: 2023-02-18 | Disposition: A | Discharge status: Home / Self Care | Payer: Medicare Other | Source: Ambulatory Visit | Attending: Radiation Oncology | Admitting: Radiation Oncology

## 2023-02-18 ENCOUNTER — Other Ambulatory Visit: Payer: Self-pay

## 2023-02-18 DIAGNOSIS — C538 Malignant neoplasm of overlapping sites of cervix uteri: Secondary | ICD-10-CM | POA: Diagnosis not present

## 2023-02-18 DIAGNOSIS — Z510 Encounter for antineoplastic radiation therapy: Secondary | ICD-10-CM | POA: Diagnosis not present

## 2023-02-18 LAB — RAD ONC ARIA SESSION SUMMARY
Course Elapsed Days: 35
Plan Fractions Treated to Date: 2
Plan Prescribed Dose Per Fraction: 1.8 Gy
Plan Total Fractions Prescribed: 5
Plan Total Prescribed Dose: 9 Gy
Reference Point Dosage Given to Date: 3.6 Gy
Reference Point Session Dosage Given: 1.8 Gy
Session Number: 27

## 2023-02-19 ENCOUNTER — Other Ambulatory Visit (HOSPITAL_COMMUNITY): Payer: Self-pay

## 2023-02-19 ENCOUNTER — Ambulatory Visit: Payer: Medicare Other

## 2023-02-19 ENCOUNTER — Ambulatory Visit
Admission: RE | Admit: 2023-02-19 | Discharge: 2023-02-19 | Disposition: A | Discharge status: Home / Self Care | Payer: Medicare Other | Source: Ambulatory Visit | Attending: Radiation Oncology | Admitting: Radiation Oncology

## 2023-02-19 ENCOUNTER — Other Ambulatory Visit: Payer: Self-pay

## 2023-02-19 DIAGNOSIS — Z510 Encounter for antineoplastic radiation therapy: Secondary | ICD-10-CM | POA: Diagnosis not present

## 2023-02-19 DIAGNOSIS — C538 Malignant neoplasm of overlapping sites of cervix uteri: Secondary | ICD-10-CM | POA: Diagnosis not present

## 2023-02-19 LAB — RAD ONC ARIA SESSION SUMMARY
Course Elapsed Days: 36
Plan Fractions Treated to Date: 3
Plan Prescribed Dose Per Fraction: 1.8 Gy
Plan Total Fractions Prescribed: 5
Plan Total Prescribed Dose: 9 Gy
Reference Point Dosage Given to Date: 5.4 Gy
Reference Point Session Dosage Given: 1.8 Gy
Session Number: 28

## 2023-02-21 ENCOUNTER — Other Ambulatory Visit: Payer: Self-pay

## 2023-02-21 ENCOUNTER — Ambulatory Visit
Admission: RE | Admit: 2023-02-21 | Discharge: 2023-02-21 | Disposition: A | Discharge status: Home / Self Care | Payer: Medicare Other | Source: Ambulatory Visit | Attending: Radiation Oncology | Admitting: Radiation Oncology

## 2023-02-21 ENCOUNTER — Ambulatory Visit: Payer: Medicare Other

## 2023-02-21 DIAGNOSIS — C538 Malignant neoplasm of overlapping sites of cervix uteri: Secondary | ICD-10-CM | POA: Diagnosis not present

## 2023-02-21 DIAGNOSIS — Z510 Encounter for antineoplastic radiation therapy: Secondary | ICD-10-CM | POA: Diagnosis not present

## 2023-02-21 LAB — RAD ONC ARIA SESSION SUMMARY
Course Elapsed Days: 38
Plan Fractions Treated to Date: 4
Plan Prescribed Dose Per Fraction: 1.8 Gy
Plan Total Fractions Prescribed: 5
Plan Total Prescribed Dose: 9 Gy
Reference Point Dosage Given to Date: 7.2 Gy
Reference Point Session Dosage Given: 1.8 Gy
Session Number: 29

## 2023-02-24 ENCOUNTER — Other Ambulatory Visit: Payer: Self-pay

## 2023-02-24 ENCOUNTER — Ambulatory Visit: Payer: Medicare Other

## 2023-02-24 ENCOUNTER — Ambulatory Visit
Admission: RE | Admit: 2023-02-24 | Discharge: 2023-02-24 | Disposition: A | Discharge status: Home / Self Care | Payer: Medicare Other | Source: Ambulatory Visit | Attending: Radiation Oncology | Admitting: Radiation Oncology

## 2023-02-24 DIAGNOSIS — Z510 Encounter for antineoplastic radiation therapy: Secondary | ICD-10-CM | POA: Diagnosis not present

## 2023-02-24 DIAGNOSIS — C538 Malignant neoplasm of overlapping sites of cervix uteri: Secondary | ICD-10-CM | POA: Diagnosis not present

## 2023-02-24 LAB — RAD ONC ARIA SESSION SUMMARY
Course Elapsed Days: 41
Plan Fractions Treated to Date: 5
Plan Prescribed Dose Per Fraction: 1.8 Gy
Plan Total Fractions Prescribed: 5
Plan Total Prescribed Dose: 9 Gy
Reference Point Dosage Given to Date: 9 Gy
Reference Point Session Dosage Given: 1.8 Gy
Session Number: 30

## 2023-02-25 ENCOUNTER — Ambulatory Visit: Payer: Medicare Other

## 2023-02-26 NOTE — Radiation Completion Notes (Signed)
Patient Name: [name], [name] MRN: [id number] Date of Birth: February 04, 1951 Referring Physician: [name], M.D. Date of Service: 2023-02-26 Radiation Oncologist: Arnette Schaumann, M.D. 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 Staging on 2022-12-24: Cervical cancer (HCC) T=cT2b, N=cN2a, M=cM0 Intent: Curative     ==========DELIVERED PLANS==========  First Treatment Date: 2023-01-14 - Last Treatment Date: 2023-02-24   Plan Name: Pelvis Site: Pelvis Technique: IMRT Mode: Photon Dose Per Fraction: 1.8 Gy Prescribed Dose (Delivered / Prescribed): 45 Gy / 45 Gy Prescribed Fxs (Delivered / Prescribed): 25 / 25   Plan Name: Pelvis_Bst Site: Pelvis Technique: 3D Mode: Photon Dose Per Fraction: 1.8 Gy Prescribed Dose (Delivered / Prescribed): 9 Gy / 9 Gy Prescribed Fxs (Delivered / Prescribed): 5 / 5     ==========ON TREATMENT VISIT DATES========== 2023-01-14, 2023-01-20, 2023-01-31, 2023-02-04, 2023-02-11, 2023-02-18     ==========UPCOMING VISITS========== 2024-08-14T12:15:00Z [facility] Antony Blackbird, MD; Antony Blackbird, MD  2024-08-07T12:15:00Z [facility] Ambulatory Surgery Antony Blackbird, MD; Antony Blackbird, MD  2024-08-01T11:15:00Z [facility] Ambulatory Surgery Antony Blackbird, MD; Antony Blackbird, MD  2024-07-25T11:15:00Z [facility] Ambulatory Surgery Antony Blackbird, MD; Antony Blackbird, MD  2024-07-17T16:00:00Z [facility] Ambulatory Surgery Antony Blackbird, MD; Antony Blackbird, MD        ==========APPENDIX - ON TREATMENT VISIT NOTES==========   See weekly On Treatment Notes in Epic for details.

## 2023-02-27 ENCOUNTER — Other Ambulatory Visit (HOSPITAL_COMMUNITY): Payer: Self-pay

## 2023-02-27 ENCOUNTER — Other Ambulatory Visit: Payer: Self-pay

## 2023-02-27 ENCOUNTER — Other Ambulatory Visit: Payer: Self-pay | Admitting: Family Medicine

## 2023-02-27 MED ORDER — POTASSIUM CHLORIDE CRYS ER 20 MEQ PO TBCR
20.0000 meq | EXTENDED_RELEASE_TABLET | Freq: Two times a day (BID) | ORAL | 1 refills | Status: DC
  Filled 2023-03-13: qty 180, 90d supply, fill #0
  Filled 2023-06-11: qty 180, 90d supply, fill #1

## 2023-03-04 NOTE — H&P (Signed)
Radiation Oncology         [phone] ________________________________  History and physical examination  Name: [name] MRN: [id number]  Date: 03/04/2023  DOB: 11-May-1951   DIAGNOSIS: The primary encounter diagnosis was Pre-op testing. 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   Cancer Staging  Cervical cancer [facility]) Staging form: Cervix Uteri, AJCC Version 9 - Clinical stage from 12/24/2022: FIGO Stage IIIC2 (cT2b, cN2a, cM0) - Signed by Artis Delay, MD on 12/31/2022  HISTORY OF PRESENT ILLNESS::[name] is a 72 y.o. female who is accompanied by her husband. she is seen as a courtesy of Dr. Alvester Morin for an opinion concerning radiation therapy as part of management for her recently diagnosed cervical cancer.   The patient first reported a new onset of postmenopausal bleeding during a follow-up visit with cardiology on 11/04/22. 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 Per encounter notes, the patient somewhat attributed her vaginal bleeding to Eliquis. Given this concern, she was transitioned from Eliquis to Plavix.   A week later, she presented to the ED on 11/19/22 with c/o of heavy vaginal bleeding after passing some heavy clots that morning. Per encounter notes, the patient detailed having several months of vaginal spotting which had since become heavier. The on-call OB/GYN was contacted who did not recommend any further intervention beyond iron supplementation given her overall unremarkable work-up including a pelvic ultrasound which was limited/mostly non-diagnostic. With that being said, she was discharged home with instructions to proceed with OP care.   Accordingly, the patient met with Dr. Hyacinth Meeker at [facility] on 11/30/22 for  further evaluation. GU exam performed during this visit was notable for a very friable and large fungating cervix with an exophytic appearance. 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 (Significant bleeding was also noted upon obtaining tissue sampling).   Subsequently, the patient was referred to Dr. Alvester Morin (Gyn-Onc). During her initial consultation with Dr. Alvester Morin on 12/09/22, the patient denied any further vaginal bleeding since her biopsy was performed. For treatment planning, Dr. Alvester Morin recommended first proceeding with a PET scan to rule out any metastatic disease and well as an MRI of the pelvis.   Staging work-up studies are detailed as follows:  -- MRI of the pelvis on 12/18/22 demonstrated: a 7.8 cm solid mass centered in the uterine cervix, causing hydrometra, and involving the upper 2/3 of the vagina and parametrial soft tissues bilaterally. No evidence of hydronephrosis or pelvic sidewall involvement were appreciated. MRI also showed mild bilateral iliac lymphadenopathy, suspicious for metastatic disease, and evidence of sigmoid diverticulosis without evidence of diverticulitis.  -- PET scan performed yesterday (12/30/22) demonstrated the cervical mass as intensely hypermetabolic measuring approximately 4.5 x 5.9 cm, and with an SUV max 27.0. PET also showed: hypermetabolic metastatic bilateral iliac chain lymph nodes, and nonspecific mildly hypermetabolic unenlarged left level II cervical and portacaval lymph nodes. (Other findings of potential clinical significance included a site of focal hypermetabolism associated with a right thyroid nodule, and an enlarged pulmonic trunk, indicative of pulmonary arterial hypertension.  The patient was seen in consultation by Dr. Bertis Ruddy on  12/24/22. Systemic treatment options discussed  include cisplatin versus carboplatin. Although cisplatin would be favorable, due to her severe baseline peripheral neuropathy, Dr. Bertis Ruddy does not believe she will be a good candidate for cisplatin. Given that PET imaging was pending at the time of this visit, a final treatment decision could not be concluded. The patient will return to Dr. Bertis Ruddy in the near future to make a final decision regarding chemotherapy.   Her case was also presented at the Gyn-Onc tumor board held on 12/23/22. Disposition concluded is to definitive chemoradiation plus pembrolizumab depending on PD-L1 results.   Based on her locally advanced disease, PD-L1 testing (detailed below), tumor board consensus, and PET/MRI findings, Dr. Alvester Morin recommends proceeding with concurrent chemoradiation plus pembrolizumab which we will discuss in detail today.   Of note: PD-L1 testing showed a CPS score of 1%.   She has recently completed her external beam and radiosensitizing chemotherapy.  She is now ready to proceed with brachytherapy treatments to complete her definitive course of radiation therapy.   PREVIOUS RADIATION THERAPY: No  PAST MEDICAL HISTORY:  Past Medical History:  Diagnosis Date   Allergy    Cataract    BILATERAL-REMOVED 2 YEARS AGO   Cervical cancer (HCC)    COVID 09/2021   Encounter for blood transfusion 01/19/2023   2 units given   Erythromelalgia [facility])    followed by neuro Dr Allena Katz , mgd on gabapentin , dx several years ago    GERD (gastroesophageal reflux disease)    Helicobacter pylori gastritis 10/05/2018   History of atrial fibrillation 09/2022   s/p watchman implantation   Hx of colonic polyp - ssp 11/03/2014   Hypercalcemia    Hypertension    Left maxillary fracture (HCC) 07/17/2017   fell down my stairs 2 year ago , deneis any metal in place nor difficulty with jax extension    Neuropathy    feet and hands   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 Lalla Brothers   Pseudogout of foot    Rheumatoid arthritis(714.0) dx 2010   Shingles    hx of    Tibial plateau fracture, right, closed, initial encounter 07/16/2017    PAST SURGICAL HISTORY: Past Surgical History:  Procedure Laterality Date   ATRIAL FIBRILLATION ABLATION N/A 04/16/2022   Procedure: ATRIAL FIBRILLATION ABLATION;  Surgeon: Lanier Prude, MD;  Location: [facility] INVASIVE CV LAB;  Service: Cardiovascular;  Laterality: N/A;   BREAST BIOPSY  1972   Broken wrist Bilateral 2010   CARDIOVERSION N/A 11/22/2021   Procedure: CARDIOVERSION;  Surgeon: Sande Rives, MD;  Location: [facility] ENDOSCOPY;  Service: Cardiovascular;  Laterality: N/A;   CATARACT EXTRACTION Bilateral 03/2012   COLONOSCOPY     COSMETIC SURGERY     breast   FRACTURE SURGERY     INNER EAR SURGERY     busted ear drum   LEFT ATRIAL APPENDAGE OCCLUSION N/A 10/03/2022   Procedure: LEFT ATRIAL APPENDAGE OCCLUSION;  Surgeon: Lanier Prude, MD;  Location: [facility] INVASIVE CV LAB;  Service: Cardiovascular;  Laterality: N/A;   MAXIMUM ACCESS (MAS)POSTERIOR LUMBAR INTERBODY FUSION (PLIF) 1 LEVEL N/A 10/11/2016   Procedure: Lumbar one-Sacral  one Maximum access posterior lumbar interbody fusion;  Surgeon: Maeola Harman, MD;  Location: [facility] OR;  Service: Neurosurgery;  Laterality: N/A;   ORIF WRIST FRACTURE Right 07/18/2017   Procedure: OPEN REDUCTION INTERNAL FIXATION (ORIF) WRIST FRACTURE;  Surgeon: Sheral Apley, MD;  Location: [facility] OR;  Service: Orthopedics;  Laterality: Right;   PARATHYROIDECTOMY Left 04/12/2019   Procedure: LEFT SUPERIOR PARATHYROIDECTOMY;  Surgeon: Darnell Level, MD;  Location: [facility] ORS;  Service: General;  Laterality: Left;   TEE WITHOUT CARDIOVERSION N/A  10/03/2022   Procedure: TRANSESOPHAGEAL ECHOCARDIOGRAM (TEE);  Surgeon: Lanier Prude, MD;  Location: [facility] INVASIVE CV LAB;  Service: Cardiovascular;  Laterality: N/A;    FAMILY HISTORY:  Family History  Problem Relation Age of Onset   Stroke Mother    Hypertension Father    Heart attack Father    Hypertension Sister        x 3   Hyperthyroidism Sister        x2, s/p RAI ablation   Liver cancer Sister    Hypertension Brother    Hypothyroidism Brother    Colon cancer Neg Hx    Esophageal cancer Neg Hx    Rectal cancer Neg Hx    Stomach cancer Neg Hx    Breast cancer Neg Hx    Ovarian cancer Neg Hx    Endometrial cancer Neg Hx    Pancreatic cancer Neg Hx    Prostate cancer Neg Hx     SOCIAL HISTORY:  Social History   Tobacco Use   Smoking status: Former    Current packs/day: 0.00    Average packs/day: 4.0 packs/day for 4.0 years (16.0 ttl pk-yrs)    Types: Cigarettes    Start date: 24    Quit date: 85    Years since quitting: 39.5   Smokeless tobacco: Never  Vaping Use   Vaping status: Never Used  Substance Use Topics   Alcohol use: No    Alcohol/week: 0.0 standard drinks of alcohol   Drug use: No    ALLERGIES:  Allergies  Allergen Reactions   Amlodipine Besylate Other (See Comments)    Tremors   Divalproex Sodium Swelling and Other (See Comments)   Nortriptyline Nausea And Vomiting and Other (See Comments)    tremors   Hydromorphone Hcl Other (See Comments)    Headache, muscle tightness   Other     Band-aid (skin redness)   Rosuvastatin Other (See Comments)    Muscle aches, pains. Resolved with stopping medication.  Muscle aches / pains   Tramadol Other (See Comments)    Felt "stoned"    MEDICATIONS:  No current facility-administered medications for this encounter.   Current Outpatient Medications  Medication Sig Dispense Refill   acetaminophen (TYLENOL) 650 MG CR tablet Take 1,300 mg by mouth every 8 (eight) hours as needed for pain.      amoxicillin (AMOXIL) 500 MG capsule Take 4 capsules (2,000 mg total) by mouth as directed. (Patient not taking: Reported on 01/18/2023) 12 capsule 6   BIOTIN PO Take 1 tablet by mouth at bedtime.     Cholecalciferol (VITAMIN D) 125 MCG (5000 UT) CAPS Take 5,000 Units by mouth in the morning.     ferrous sulfate 325 (65 FE) MG tablet Take 1 tablet (325 mg total) by mouth daily. 100 tablet 0   fluticasone (FLONASE) 50 MCG/ACT nasal spray Place 1-2 sprays into both nostrils daily as needed for allergies or rhinitis.     furosemide (LASIX) 20 MG  tablet Take 2 - 3 tablets (40 - 60 mg total) by mouth in the morning and at bedtime. (Patient taking differently: Take 40-60 mg by mouth in the morning and at bedtime. Take 3 tablets (60 mg) in the morning and Take 2 tablets (40 mg) at bedtime) 360 tablet 3   gabapentin (NEURONTIN) 100 MG capsule TAKE 3 CAPSULES BY MOUTH IN THE MORNING AND 2 CAPSULES AT BEDTIME. OK TO TAKE EXTRA CAPSULE AT BEDTIME AS NEEDED 540 capsule 3   hydroxychloroquine (PLAQUENIL) 200 MG tablet Take 1 tablet (200 mg total) by mouth 2 (two) times daily with food or milk 180 tablet 1   hydroxypropyl methylcellulose / hypromellose (ISOPTO TEARS / GONIOVISC) 2.5 % ophthalmic solution Place 1 drop into both eyes in the morning.     losartan (COZAAR) 100 MG tablet TAKE 1 TABLET BY MOUTH ONCE A DAY 90 tablet 1   Menthol, Topical Analgesic, (BIOFREEZE) 4 % GEL Apply 1 application  topically at bedtime. Neuropathy feet/ankles     metoprolol succinate (TOPROL-XL) 50 MG 24 hr tablet Take 1 tablet (50 mg total) by mouth 2 (two) times daily. 270 tablet 1   Multiple Vitamins-Minerals (MULTIVITAMIN WITH MINERALS) tablet Take 2 tablets by mouth daily.     oxyCODONE-acetaminophen (PERCOCET/ROXICET) 5-325 MG tablet Take 1 tablet by mouth every 6 (six) hours as needed for severe pain. 30 tablet 0   potassium chloride SA (KLOR-CON M) 20 MEQ tablet Take 1 tablet by mouth 2 times daily. 180 tablet 1    prochlorperazine (COMPAZINE) 10 MG tablet Take 1 tablet (10 mg) by mouth every 6 hours as needed for nausea or vomiting. 30 tablet 0   sulfaSALAzine (AZULFIDINE) 500 MG tablet Take 2 tablets  by mouth 2 times daily. 120 tablet 2   vitamin C (ASCORBIC ACID) 500 MG tablet Take 500 mg by mouth daily.      REVIEW OF SYSTEMS:  A 10+ POINT REVIEW OF SYSTEMS WAS OBTAINED including neurology, dermatology, psychiatry, cardiac, respiratory, lymph, extremities, GI, GU, musculoskeletal, constitutional, reproductive, HEENT.  Reports pain deep within the pelvis area as well as low back pain.  She denies any significant vaginal bleeding but does have some occasional blood tinged/yellow vaginal discharge.  Since completion of her external beam radiation therapy she has had no further bleeding.   PHYSICAL EXAM:  height is 5\' 6"  (1.676 m) and weight is 267 lb (121.1 kg).   General: Alert and oriented, in no acute distress HEENT: Head is normocephalic. Extraocular movements are intact.  Neck: Neck is supple, no palpable cervical or supraclavicular lymphadenopathy. Heart: Regular in rate and rhythm with no murmurs, rubs, or gallops. Chest: Clear to auscultation bilaterally, with no rhonchi, wheezes, or rales. Abdomen: Soft, nontender, nondistended, with no rigidity or guarding. Extremities: No cyanosis or edema. Lymphatics: see Neck Exam Skin: No concerning lesions. Musculoskeletal: symmetric strength and muscle tone throughout. Neurologic: Cranial nerves II through XII are grossly intact. No obvious focalities. Speech is fluent. Coordination is intact. Psychiatric: Judgment and insight are intact. Affect is appropriate.  On pelvic examination the external genitalia were unremarkable. A speculum exam was performed. the cervix is replaced with a fungating mass that fills the vaginal apex. Bimanual exam reveals an approximate 6cm cervical mass.  Rectovaginal exam confirms.  Unable to evaluate parametrial involvement  on my exam due to patient's body habitus.  ECOG = 1  1 - Symptomatic but completely ambulatory (Restricted in physically strenuous activity but ambulatory and able to carry out work  of a light or sedentary nature. For example, light housework, office work)    LABORATORY DATA:  Lab Results  Component Value Date   WBC 4.0 02/04/2023   HGB 11.8 (L) 02/04/2023   HCT 35.4 (L) 02/04/2023   MCV 88.9 02/04/2023   PLT 176 02/04/2023   NEUTROABS 3.0 02/04/2023   Lab Results  Component Value Date   NA 139 02/04/2023   K 3.6 02/04/2023   CL 100 02/04/2023   CO2 31 02/04/2023   GLUCOSE 116 (H) 02/04/2023   BUN 12 02/04/2023   CREATININE 0.97 02/04/2023   CALCIUM 9.7 02/04/2023      RADIOGRAPHY: No results found.    IMPRESSION:    Cancer Staging  Cervical cancer [facility]) Staging form: Cervix Uteri, AJCC Version 9 - Clinical stage from 12/24/2022: FIGO Stage IIIC2 (cT2b, cN2a, cM0) - Signed by Artis Delay, MD on 12/31/2022  Based on physical examination and staging the patient would not be a candidate for definitive surgery.  She however would be a candidate for definitive course of radiation along with radiosensitizing chemotherapy.  The patient does have a history of rheumatoid arthritis on Plaquenil and sulfasalazine.  She therefore may not see benefit from immunotherapy despite FDA approval in this situation for locally advanced cervical cancer. She in addition has significant neuropathy which may be potentially worsened by radiosensitizing cisplatin based chemotherapy.  She however may be a candidate for carboplatin and Dr. Bertis Ruddy will discuss this issue further in the near future.  She has successfully completed her external beam along with radiosensitizing chemotherapy.  Vaginal bleeding has subsided.   PLAN: She will be taken to the operating room on July 17 at 1215 for exam under anesthesia and placement of brachytherapy equipment in preparation for high-dose-rate radiation  treatments.  The patient is to receive 5.5 Wallace Cullens to the high risk clinical target volume.  Iridium 192 will be the high-dose-rate source.  She is scheduled to receive a total of 5 high-dose-rate treatments to complete her definitive course of radiation therapy.    ------------------------------------------------  Billie Lade, PhD, MD

## 2023-03-04 NOTE — Anesthesia Preprocedure Evaluation (Signed)
Anesthesia Evaluation  Patient identified by MRN, date of birth, ID band Patient awake    Reviewed: Allergy & Precautions, NPO status , Patient's Chart, lab work & pertinent test results  History of Anesthesia Complications (+) PONV and history of anesthetic complications  Airway Mallampati: IV  TM Distance: >3 FB Neck ROM: Full    Dental no notable dental hx. (+) Missing, Dental Advisory Given, Partial Upper, Partial Lower,    Pulmonary neg shortness of breath, neg sleep apnea (pt told she has sleep apnea says study equivocal), neg COPD, neg recent URI, former smoker   Pulmonary exam normal breath sounds clear to auscultation       Cardiovascular hypertension, Pt. on medications and Pt. on home beta blockers + Peripheral Vascular Disease  Normal cardiovascular exam+ dysrhythmias Atrial Fibrillation  Rhythm:Regular Rate:Normal   1. Left ventricular ejection fraction, by estimation, is 55 to 60%. The  left ventricle has normal function. The left ventricle has no regional  wall motion abnormalities. The left ventricular internal cavity size was  mildly dilated. There is mild left  ventricular hypertrophy. Left ventricular diastolic function could not be  evaluated.   2. Right ventricular systolic function is mildly reduced. The right  ventricular size is mildly enlarged. There is mildly elevated pulmonary  artery systolic pressure.   3. The mitral valve is normal in structure. Mild mitral valve  regurgitation. No evidence of mitral stenosis.   4. The aortic valve is tricuspid. Aortic valve regurgitation is trivial.  Aortic valve sclerosis is present, with no evidence of aortic valve  stenosis.   5. Aortic dilatation noted. There is mild dilatation of the aortic root,  measuring 41 mm. There is mild dilatation of the ascending aorta,  measuring 41 mm.   6. The inferior vena cava is dilated in size with >50% respiratory   variability, suggesting right atrial pressure of 8 mmHg.      Neuro/Psych  Neuromuscular disease  negative psych ROS   GI/Hepatic Neg liver ROS,GERD  ,,  Endo/Other    Morbid obesity  Renal/GU Lab Results      Component                Value               Date                      NA                       139                 02/04/2023                CL                       100                 02/04/2023                K                        3.6                 02/04/2023                   CREATININE  0.97                02/04/2023                              Musculoskeletal  (+) Arthritis ,    Abdominal   Peds  Hematology Lab Results      Component                Value               Date                      WBC                      4.0                 02/04/2023                HGB                      11.8 (L)            02/04/2023                HCT                      35.4 (L)            02/04/2023                MCV                      88.9                02/04/2023                PLT                      176                 02/04/2023            eliquis   Anesthesia Other Findings ALL: Tramadol, Amlodipine, nortryptyline, hydromrophine  Reproductive/Obstetrics                             Anesthesia Physical Anesthesia Plan  ASA: 3  Anesthesia Plan: General   Post-op Pain Management: Minimal or no pain anticipated, Toradol IV (intra-op)*, Precedex and Tylenol PO (pre-op)*   Induction: Intravenous  PONV Risk Score and Plan: 4 or greater and Ondansetron, Dexamethasone, Propofol infusion, TIVA and Treatment may vary due to age or medical condition  Airway Management Planned: LMA  Additional Equipment:   Intra-op Plan:   Post-operative Plan: Extubation in OR  Informed Consent: I have reviewed the patients History and Physical, chart, labs and discussed the procedure including the risks, benefits and alternatives  for the proposed anesthesia with the patient or authorized representative who has indicated his/her understanding and acceptance.     Dental advisory given  Plan Discussed with: CRNA  Anesthesia Plan Comments:         Anesthesia Quick Evaluation

## 2023-03-04 NOTE — H&P (View-Only) (Signed)
Radiation Oncology         [phone] ________________________________  History and physical examination  Name: [name] MRN: [id number]  Date: 03/04/2023  DOB: 11-May-1951   DIAGNOSIS: The primary encounter diagnosis was Pre-op testing. 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   Cancer Staging  Cervical cancer [facility]) Staging form: Cervix Uteri, AJCC Version 9 - Clinical stage from 12/24/2022: FIGO Stage IIIC2 (cT2b, cN2a, cM0) - Signed by Artis Delay, MD on 12/31/2022  HISTORY OF PRESENT ILLNESS::[name] is a 72 y.o. female who is accompanied by her husband. she is seen as a courtesy of Dr. Alvester Morin for an opinion concerning radiation therapy as part of management for her recently diagnosed cervical cancer.   The patient first reported a new onset of postmenopausal bleeding during a follow-up visit with cardiology on 11/04/22. 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 Per encounter notes, the patient somewhat attributed her vaginal bleeding to Eliquis. Given this concern, she was transitioned from Eliquis to Plavix.   A week later, she presented to the ED on 11/19/22 with c/o of heavy vaginal bleeding after passing some heavy clots that morning. Per encounter notes, the patient detailed having several months of vaginal spotting which had since become heavier. The on-call OB/GYN was contacted who did not recommend any further intervention beyond iron supplementation given her overall unremarkable work-up including a pelvic ultrasound which was limited/mostly non-diagnostic. With that being said, she was discharged home with instructions to proceed with OP care.   Accordingly, the patient met with Dr. Hyacinth Meeker at [facility] on 11/30/22 for  further evaluation. GU exam performed during this visit was notable for a very friable and large fungating cervix with an exophytic appearance. 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 (Significant bleeding was also noted upon obtaining tissue sampling).   Subsequently, the patient was referred to Dr. Alvester Morin (Gyn-Onc). During her initial consultation with Dr. Alvester Morin on 12/09/22, the patient denied any further vaginal bleeding since her biopsy was performed. For treatment planning, Dr. Alvester Morin recommended first proceeding with a PET scan to rule out any metastatic disease and well as an MRI of the pelvis.   Staging work-up studies are detailed as follows:  -- MRI of the pelvis on 12/18/22 demonstrated: a 7.8 cm solid mass centered in the uterine cervix, causing hydrometra, and involving the upper 2/3 of the vagina and parametrial soft tissues bilaterally. No evidence of hydronephrosis or pelvic sidewall involvement were appreciated. MRI also showed mild bilateral iliac lymphadenopathy, suspicious for metastatic disease, and evidence of sigmoid diverticulosis without evidence of diverticulitis.  -- PET scan performed yesterday (12/30/22) demonstrated the cervical mass as intensely hypermetabolic measuring approximately 4.5 x 5.9 cm, and with an SUV max 27.0. PET also showed: hypermetabolic metastatic bilateral iliac chain lymph nodes, and nonspecific mildly hypermetabolic unenlarged left level II cervical and portacaval lymph nodes. (Other findings of potential clinical significance included a site of focal hypermetabolism associated with a right thyroid nodule, and an enlarged pulmonic trunk, indicative of pulmonary arterial hypertension.  The patient was seen in consultation by Dr. Bertis Ruddy on  12/24/22. Systemic treatment options discussed  include cisplatin versus carboplatin. Although cisplatin would be favorable, due to her severe baseline peripheral neuropathy, Dr. Bertis Ruddy does not believe she will be a good candidate for cisplatin. Given that PET imaging was pending at the time of this visit, a final treatment decision could not be concluded. The patient will return to Dr. Bertis Ruddy in the near future to make a final decision regarding chemotherapy.   Her case was also presented at the Gyn-Onc tumor board held on 12/23/22. Disposition concluded is to definitive chemoradiation plus pembrolizumab depending on PD-L1 results.   Based on her locally advanced disease, PD-L1 testing (detailed below), tumor board consensus, and PET/MRI findings, Dr. Alvester Morin recommends proceeding with concurrent chemoradiation plus pembrolizumab which we will discuss in detail today.   Of note: PD-L1 testing showed a CPS score of 1%.   She has recently completed her external beam and radiosensitizing chemotherapy.  She is now ready to proceed with brachytherapy treatments to complete her definitive course of radiation therapy.   PREVIOUS RADIATION THERAPY: No  PAST MEDICAL HISTORY:  Past Medical History:  Diagnosis Date   Allergy    Cataract    BILATERAL-REMOVED 2 YEARS AGO   Cervical cancer (HCC)    COVID 09/2021   Encounter for blood transfusion 01/19/2023   2 units given   Erythromelalgia [facility])    followed by neuro Dr Allena Katz , mgd on gabapentin , dx several years ago    GERD (gastroesophageal reflux disease)    Helicobacter pylori gastritis 10/05/2018   History of atrial fibrillation 09/2022   s/p watchman implantation   Hx of colonic polyp - ssp 11/03/2014   Hypercalcemia    Hypertension    Left maxillary fracture (HCC) 07/17/2017   fell down my stairs 2 year ago , deneis any metal in place nor difficulty with jax extension    Neuropathy    feet and hands   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 Lalla Brothers   Pseudogout of foot    Rheumatoid arthritis(714.0) dx 2010   Shingles    hx of    Tibial plateau fracture, right, closed, initial encounter 07/16/2017    PAST SURGICAL HISTORY: Past Surgical History:  Procedure Laterality Date   ATRIAL FIBRILLATION ABLATION N/A 04/16/2022   Procedure: ATRIAL FIBRILLATION ABLATION;  Surgeon: Lanier Prude, MD;  Location: [facility] INVASIVE CV LAB;  Service: Cardiovascular;  Laterality: N/A;   BREAST BIOPSY  1972   Broken wrist Bilateral 2010   CARDIOVERSION N/A 11/22/2021   Procedure: CARDIOVERSION;  Surgeon: Sande Rives, MD;  Location: [facility] ENDOSCOPY;  Service: Cardiovascular;  Laterality: N/A;   CATARACT EXTRACTION Bilateral 03/2012   COLONOSCOPY     COSMETIC SURGERY     breast   FRACTURE SURGERY     INNER EAR SURGERY     busted ear drum   LEFT ATRIAL APPENDAGE OCCLUSION N/A 10/03/2022   Procedure: LEFT ATRIAL APPENDAGE OCCLUSION;  Surgeon: Lanier Prude, MD;  Location: [facility] INVASIVE CV LAB;  Service: Cardiovascular;  Laterality: N/A;   MAXIMUM ACCESS (MAS)POSTERIOR LUMBAR INTERBODY FUSION (PLIF) 1 LEVEL N/A 10/11/2016   Procedure: Lumbar one-Sacral  one Maximum access posterior lumbar interbody fusion;  Surgeon: Maeola Harman, MD;  Location: [facility] OR;  Service: Neurosurgery;  Laterality: N/A;   ORIF WRIST FRACTURE Right 07/18/2017   Procedure: OPEN REDUCTION INTERNAL FIXATION (ORIF) WRIST FRACTURE;  Surgeon: Sheral Apley, MD;  Location: [facility] OR;  Service: Orthopedics;  Laterality: Right;   PARATHYROIDECTOMY Left 04/12/2019   Procedure: LEFT SUPERIOR PARATHYROIDECTOMY;  Surgeon: Darnell Level, MD;  Location: [facility] ORS;  Service: General;  Laterality: Left;   TEE WITHOUT CARDIOVERSION N/A  10/03/2022   Procedure: TRANSESOPHAGEAL ECHOCARDIOGRAM (TEE);  Surgeon: Lanier Prude, MD;  Location: [facility] INVASIVE CV LAB;  Service: Cardiovascular;  Laterality: N/A;    FAMILY HISTORY:  Family History  Problem Relation Age of Onset   Stroke Mother    Hypertension Father    Heart attack Father    Hypertension Sister        x 3   Hyperthyroidism Sister        x2, s/p RAI ablation   Liver cancer Sister    Hypertension Brother    Hypothyroidism Brother    Colon cancer Neg Hx    Esophageal cancer Neg Hx    Rectal cancer Neg Hx    Stomach cancer Neg Hx    Breast cancer Neg Hx    Ovarian cancer Neg Hx    Endometrial cancer Neg Hx    Pancreatic cancer Neg Hx    Prostate cancer Neg Hx     SOCIAL HISTORY:  Social History   Tobacco Use   Smoking status: Former    Current packs/day: 0.00    Average packs/day: 4.0 packs/day for 4.0 years (16.0 ttl pk-yrs)    Types: Cigarettes    Start date: 24    Quit date: 85    Years since quitting: 39.5   Smokeless tobacco: Never  Vaping Use   Vaping status: Never Used  Substance Use Topics   Alcohol use: No    Alcohol/week: 0.0 standard drinks of alcohol   Drug use: No    ALLERGIES:  Allergies  Allergen Reactions   Amlodipine Besylate Other (See Comments)    Tremors   Divalproex Sodium Swelling and Other (See Comments)   Nortriptyline Nausea And Vomiting and Other (See Comments)    tremors   Hydromorphone Hcl Other (See Comments)    Headache, muscle tightness   Other     Band-aid (skin redness)   Rosuvastatin Other (See Comments)    Muscle aches, pains. Resolved with stopping medication.  Muscle aches / pains   Tramadol Other (See Comments)    Felt "stoned"    MEDICATIONS:  No current facility-administered medications for this encounter.   Current Outpatient Medications  Medication Sig Dispense Refill   acetaminophen (TYLENOL) 650 MG CR tablet Take 1,300 mg by mouth every 8 (eight) hours as needed for pain.      amoxicillin (AMOXIL) 500 MG capsule Take 4 capsules (2,000 mg total) by mouth as directed. (Patient not taking: Reported on 01/18/2023) 12 capsule 6   BIOTIN PO Take 1 tablet by mouth at bedtime.     Cholecalciferol (VITAMIN D) 125 MCG (5000 UT) CAPS Take 5,000 Units by mouth in the morning.     ferrous sulfate 325 (65 FE) MG tablet Take 1 tablet (325 mg total) by mouth daily. 100 tablet 0   fluticasone (FLONASE) 50 MCG/ACT nasal spray Place 1-2 sprays into both nostrils daily as needed for allergies or rhinitis.     furosemide (LASIX) 20 MG  tablet Take 2 - 3 tablets (40 - 60 mg total) by mouth in the morning and at bedtime. (Patient taking differently: Take 40-60 mg by mouth in the morning and at bedtime. Take 3 tablets (60 mg) in the morning and Take 2 tablets (40 mg) at bedtime) 360 tablet 3   gabapentin (NEURONTIN) 100 MG capsule TAKE 3 CAPSULES BY MOUTH IN THE MORNING AND 2 CAPSULES AT BEDTIME. OK TO TAKE EXTRA CAPSULE AT BEDTIME AS NEEDED 540 capsule 3   hydroxychloroquine (PLAQUENIL) 200 MG tablet Take 1 tablet (200 mg total) by mouth 2 (two) times daily with food or milk 180 tablet 1   hydroxypropyl methylcellulose / hypromellose (ISOPTO TEARS / GONIOVISC) 2.5 % ophthalmic solution Place 1 drop into both eyes in the morning.     losartan (COZAAR) 100 MG tablet TAKE 1 TABLET BY MOUTH ONCE A DAY 90 tablet 1   Menthol, Topical Analgesic, (BIOFREEZE) 4 % GEL Apply 1 application  topically at bedtime. Neuropathy feet/ankles     metoprolol succinate (TOPROL-XL) 50 MG 24 hr tablet Take 1 tablet (50 mg total) by mouth 2 (two) times daily. 270 tablet 1   Multiple Vitamins-Minerals (MULTIVITAMIN WITH MINERALS) tablet Take 2 tablets by mouth daily.     oxyCODONE-acetaminophen (PERCOCET/ROXICET) 5-325 MG tablet Take 1 tablet by mouth every 6 (six) hours as needed for severe pain. 30 tablet 0   potassium chloride SA (KLOR-CON M) 20 MEQ tablet Take 1 tablet by mouth 2 times daily. 180 tablet 1    prochlorperazine (COMPAZINE) 10 MG tablet Take 1 tablet (10 mg) by mouth every 6 hours as needed for nausea or vomiting. 30 tablet 0   sulfaSALAzine (AZULFIDINE) 500 MG tablet Take 2 tablets  by mouth 2 times daily. 120 tablet 2   vitamin C (ASCORBIC ACID) 500 MG tablet Take 500 mg by mouth daily.      REVIEW OF SYSTEMS:  A 10+ POINT REVIEW OF SYSTEMS WAS OBTAINED including neurology, dermatology, psychiatry, cardiac, respiratory, lymph, extremities, GI, GU, musculoskeletal, constitutional, reproductive, HEENT.  Reports pain deep within the pelvis area as well as low back pain.  She denies any significant vaginal bleeding but does have some occasional blood tinged/yellow vaginal discharge.  Since completion of her external beam radiation therapy she has had no further bleeding.   PHYSICAL EXAM:  height is 5\' 6"  (1.676 m) and weight is 267 lb (121.1 kg).   General: Alert and oriented, in no acute distress HEENT: Head is normocephalic. Extraocular movements are intact.  Neck: Neck is supple, no palpable cervical or supraclavicular lymphadenopathy. Heart: Regular in rate and rhythm with no murmurs, rubs, or gallops. Chest: Clear to auscultation bilaterally, with no rhonchi, wheezes, or rales. Abdomen: Soft, nontender, nondistended, with no rigidity or guarding. Extremities: No cyanosis or edema. Lymphatics: see Neck Exam Skin: No concerning lesions. Musculoskeletal: symmetric strength and muscle tone throughout. Neurologic: Cranial nerves II through XII are grossly intact. No obvious focalities. Speech is fluent. Coordination is intact. Psychiatric: Judgment and insight are intact. Affect is appropriate.  On pelvic examination the external genitalia were unremarkable. A speculum exam was performed. the cervix is replaced with a fungating mass that fills the vaginal apex. Bimanual exam reveals an approximate 6cm cervical mass.  Rectovaginal exam confirms.  Unable to evaluate parametrial involvement  on my exam due to patient's body habitus.  ECOG = 1  1 - Symptomatic but completely ambulatory (Restricted in physically strenuous activity but ambulatory and able to carry out work  of a light or sedentary nature. For example, light housework, office work)    LABORATORY DATA:  Lab Results  Component Value Date   WBC 4.0 02/04/2023   HGB 11.8 (L) 02/04/2023   HCT 35.4 (L) 02/04/2023   MCV 88.9 02/04/2023   PLT 176 02/04/2023   NEUTROABS 3.0 02/04/2023   Lab Results  Component Value Date   NA 139 02/04/2023   K 3.6 02/04/2023   CL 100 02/04/2023   CO2 31 02/04/2023   GLUCOSE 116 (H) 02/04/2023   BUN 12 02/04/2023   CREATININE 0.97 02/04/2023   CALCIUM 9.7 02/04/2023      RADIOGRAPHY: No results found.    IMPRESSION:    Cancer Staging  Cervical cancer [facility]) Staging form: Cervix Uteri, AJCC Version 9 - Clinical stage from 12/24/2022: FIGO Stage IIIC2 (cT2b, cN2a, cM0) - Signed by Artis Delay, MD on 12/31/2022  Based on physical examination and staging the patient would not be a candidate for definitive surgery.  She however would be a candidate for definitive course of radiation along with radiosensitizing chemotherapy.  The patient does have a history of rheumatoid arthritis on Plaquenil and sulfasalazine.  She therefore may not see benefit from immunotherapy despite FDA approval in this situation for locally advanced cervical cancer. She in addition has significant neuropathy which may be potentially worsened by radiosensitizing cisplatin based chemotherapy.  She however may be a candidate for carboplatin and Dr. Bertis Ruddy will discuss this issue further in the near future.  She has successfully completed her external beam along with radiosensitizing chemotherapy.  Vaginal bleeding has subsided.   PLAN: She will be taken to the operating room on July 17 at 1215 for exam under anesthesia and placement of brachytherapy equipment in preparation for high-dose-rate radiation  treatments.  The patient is to receive 5.5 Wallace Cullens to the high risk clinical target volume.  Iridium 192 will be the high-dose-rate source.  She is scheduled to receive a total of 5 high-dose-rate treatments to complete her definitive course of radiation therapy.    ------------------------------------------------  Billie Lade, PhD, MD

## 2023-03-04 NOTE — Progress Notes (Signed)
  Radiation Oncology         [phone] ________________________________  Name: [name] MRN: [id number]  Date: 03/05/2023  DOB: [birth year]  CC: [name], MD  [name], MD  HDR BRACHYTHERAPY NOTE  DIAGNOSIS: The encounter diagnosis was Malignant neoplasm of cervix, unspecified site [facility]) [C53.9].    Cancer Staging  Cervical cancer [facility]) Staging form: Cervix Uteri, AJCC Version 9 - Clinical stage from 12/24/2022: FIGO Stage IIIC2 (cT2b, cN2a, cM0) - Signed by Artis Delay, MD on 12/31/2022  NARRATIVE: The patient was brought to the HDR suite. Identity was confirmed. All relevant records and images related to the planned course of therapy were reviewed. The patient freely provided informed written consent to proceed with treatment after reviewing the details related to the planned course of therapy. The consent form was witnessed and verified by the simulation staff. Then, the patient was set-up in a stable reproducible supine position for radiation therapy. The tandem ring system was accessed and fiducial markers were placed within the tandem and ring.   Simple treatment device note: On the operating room the patient had construction of her custom tandem ring system. She will be treated with a *** tandem/ring system. The patient had placement of a *** mm tandem. A cervical ring with a small shielding was used for her treatment. A rectal paddle was also part of her custom set up device.  Verification simulation note: An AP and lateral film was obtained through the pelvis area. This was compared to the patient's planning films documenting accurate position of the tandem/ring system for treatment.  High-dose-rate brachytherapy treatment note:   The remote afterloading device was accessed through catheter system and attached to the tandem ring system. Patient then proceeded to undergo her first high-dose-rate treatment directed at the cervix. The patient was prescribed a  dose of *** gray to be delivered to the mucosal surface.. Patient was treated with *** channels using *** dwell positions. Treatment time was *** seconds. The patient tolerated the procedure well. After completion of her therapy, a radiation survey was performed documenting return of the iridium source into the Gamma[employer] safe. The patient was then transferred to the nursing suite.  She then had removal of the rectal paddle followed by the tandem and ring system. The patient tolerated the removal well.  PLAN: The patient will return next week for her second high-dose-rate treatment.  ________________________________   -----------------------------------  Billie Lade, PhD, MD  This document serves as a record of services personally performed by Antony Blackbird, MD. It was created on his behalf by Neena Rhymes, a trained medical scribe. The creation of this record is based on the scribe's personal observations and the provider's statements to them. This document has been checked and approved by the attending provider.

## 2023-03-05 ENCOUNTER — Other Ambulatory Visit: Payer: Self-pay

## 2023-03-05 ENCOUNTER — Ambulatory Visit (HOSPITAL_BASED_OUTPATIENT_CLINIC_OR_DEPARTMENT_OTHER): Payer: Medicare Other | Admitting: Anesthesiology

## 2023-03-05 ENCOUNTER — Encounter (HOSPITAL_BASED_OUTPATIENT_CLINIC_OR_DEPARTMENT_OTHER): Payer: Self-pay | Admitting: Radiation Oncology

## 2023-03-05 ENCOUNTER — Ambulatory Visit
Admission: RE | Admit: 2023-03-05 | Discharge: 2023-03-05 | Disposition: A | Discharge status: Home / Self Care | Payer: Medicare Other | Source: Ambulatory Visit | Attending: Radiation Oncology | Admitting: Radiation Oncology

## 2023-03-05 ENCOUNTER — Ambulatory Visit (HOSPITAL_BASED_OUTPATIENT_CLINIC_OR_DEPARTMENT_OTHER)
Admission: RE | Admit: 2023-03-05 | Discharge: 2023-03-05 | Disposition: A | Discharge status: Home / Self Care | Payer: Medicare Other | Attending: Radiation Oncology | Admitting: Radiation Oncology

## 2023-03-05 ENCOUNTER — Encounter (HOSPITAL_BASED_OUTPATIENT_CLINIC_OR_DEPARTMENT_OTHER)
Admission: RE | Disposition: A | Discharge status: Home / Self Care | Payer: Self-pay | Source: Home / Self Care | Attending: Radiation Oncology

## 2023-03-05 ENCOUNTER — Ambulatory Visit (HOSPITAL_COMMUNITY)
Admission: RE | Admit: 2023-03-05 | Discharge: 2023-03-05 | Disposition: A | Discharge status: Home / Self Care | Payer: Medicare Other | Source: Ambulatory Visit

## 2023-03-05 VITALS — BP 106/77 | HR 82 | Temp 98.0°F | Resp 18

## 2023-03-05 DIAGNOSIS — E785 Hyperlipidemia, unspecified: Secondary | ICD-10-CM

## 2023-03-05 DIAGNOSIS — C538 Malignant neoplasm of overlapping sites of cervix uteri: Secondary | ICD-10-CM

## 2023-03-05 DIAGNOSIS — Z87891 Personal history of nicotine dependence: Secondary | ICD-10-CM | POA: Diagnosis not present

## 2023-03-05 DIAGNOSIS — Z800 Family history of malignant neoplasm of digestive organs: Secondary | ICD-10-CM | POA: Insufficient documentation

## 2023-03-05 DIAGNOSIS — I10 Essential (primary) hypertension: Secondary | ICD-10-CM | POA: Diagnosis not present

## 2023-03-05 DIAGNOSIS — C539 Malignant neoplasm of cervix uteri, unspecified: Secondary | ICD-10-CM | POA: Insufficient documentation

## 2023-03-05 DIAGNOSIS — I4891 Unspecified atrial fibrillation: Secondary | ICD-10-CM | POA: Diagnosis not present

## 2023-03-05 DIAGNOSIS — Z01818 Encounter for other preprocedural examination: Secondary | ICD-10-CM

## 2023-03-05 HISTORY — DX: Postmenopausal bleeding: N95.0

## 2023-03-05 HISTORY — PX: TANDEM RING INSERTION: SHX6199

## 2023-03-05 HISTORY — DX: Malignant neoplasm of cervix uteri, unspecified: C53.9

## 2023-03-05 HISTORY — PX: OPERATIVE ULTRASOUND: SHX5996

## 2023-03-05 LAB — RAD ONC ARIA SESSION SUMMARY
Course Elapsed Days: 50
Plan Fractions Treated to Date: 1
Plan Prescribed Dose Per Fraction: 5.5 Gy
Plan Total Fractions Prescribed: 1
Plan Total Prescribed Dose: 5.5 Gy
Reference Point Dosage Given to Date: 11.0222 Gy
Reference Point Dosage Given to Date: 4.6518 Gy
Reference Point Dosage Given to Date: 5.0439 Gy
Reference Point Dosage Given to Date: 5.5 Gy
Reference Point Dosage Given to Date: 9.354 Gy
Reference Point Session Dosage Given: 11.0222 Gy
Reference Point Session Dosage Given: 4.6518 Gy
Reference Point Session Dosage Given: 5.0439 Gy
Reference Point Session Dosage Given: 5.5 Gy
Reference Point Session Dosage Given: 9.354 Gy
Session Number: 31

## 2023-03-05 LAB — CBC WITH DIFFERENTIAL/PLATELET
Abs Immature Granulocytes: 0.01 10*3/uL (ref 0.00–0.07)
Basophils Absolute: 0 10*3/uL (ref 0.0–0.1)
Basophils Relative: 1 %
Eosinophils Absolute: 0.1 10*3/uL (ref 0.0–0.5)
Eosinophils Relative: 1 %
HCT: 34.1 % — ABNORMAL LOW (ref 36.0–46.0)
Hemoglobin: 11.7 g/dL — ABNORMAL LOW (ref 12.0–15.0)
Immature Granulocytes: 0 %
Lymphocytes Relative: 9 %
Lymphs Abs: 0.4 10*3/uL — ABNORMAL LOW (ref 0.7–4.0)
MCH: 30.8 pg (ref 26.0–34.0)
MCHC: 34.3 g/dL (ref 30.0–36.0)
MCV: 89.7 fL (ref 80.0–100.0)
Monocytes Absolute: 0.5 10*3/uL (ref 0.1–1.0)
Monocytes Relative: 11 %
Neutro Abs: 3.2 10*3/uL (ref 1.7–7.7)
Neutrophils Relative %: 78 %
Platelets: 154 10*3/uL (ref 150–400)
RBC: 3.8 MIL/uL — ABNORMAL LOW (ref 3.87–5.11)
RDW: 14.8 % (ref 11.5–15.5)
WBC: 4.1 10*3/uL (ref 4.0–10.5)
nRBC: 0 % (ref 0.0–0.2)

## 2023-03-05 LAB — BASIC METABOLIC PANEL
Anion gap: 8 (ref 5–15)
BUN: 15 mg/dL (ref 8–23)
CO2: 27 mmol/L (ref 22–32)
Calcium: 9.2 mg/dL (ref 8.9–10.3)
Chloride: 103 mmol/L (ref 98–111)
Creatinine, Ser: 0.96 mg/dL (ref 0.44–1.00)
GFR, Estimated: >60 mL/min (ref >60)
Glucose, Bld: 122 mg/dL — ABNORMAL HIGH (ref 70–99)
Potassium: 3.4 mmol/L — ABNORMAL LOW (ref 3.5–5.1)
Sodium: 138 mmol/L (ref 135–145)

## 2023-03-05 SURGERY — INSERTION, UTERINE TANDEM AND RING OR CYLINDER, FOR BRACHYTHERAPY
Anesthesia: General | Site: Pelvis

## 2023-03-05 MED ORDER — DEXMEDETOMIDINE HCL IN NACL 80 MCG/20ML IV SOLN
INTRAVENOUS | Status: DC | PRN
  Administered 2023-03-05 (×2): 4 ug via INTRAVENOUS

## 2023-03-05 MED ORDER — FENTANYL CITRATE (PF) 100 MCG/2ML IJ SOLN
INTRAMUSCULAR | Status: DC | PRN
  Administered 2023-03-05: 45 ug via INTRAVENOUS
  Administered 2023-03-05: 30 ug via INTRAVENOUS

## 2023-03-05 MED ORDER — LIDOCAINE 2% (20 MG/ML) 5 ML SYRINGE
INTRAMUSCULAR | Status: DC | PRN
  Administered 2023-03-05: 100 mg via INTRAVENOUS

## 2023-03-05 MED ORDER — OXYCODONE HCL 5 MG PO TABS
5.0000 mg | ORAL_TABLET | Freq: Once | ORAL | Status: AC
  Administered 2023-03-05: 5 mg via ORAL
  Filled 2023-03-05: qty 1

## 2023-03-05 MED ORDER — PROPOFOL 1000 MG/100ML IV EMUL
INTRAVENOUS | Status: AC
  Filled 2023-03-05: qty 100

## 2023-03-05 MED ORDER — FENTANYL CITRATE (PF) 100 MCG/2ML IJ SOLN
INTRAMUSCULAR | Status: AC
  Filled 2023-03-05: qty 2

## 2023-03-05 MED ORDER — LIDOCAINE HCL (PF) 2 % IJ SOLN
INTRAMUSCULAR | Status: AC
  Filled 2023-03-05: qty 5

## 2023-03-05 MED ORDER — PROPOFOL 500 MG/50ML IV EMUL
INTRAVENOUS | Status: DC | PRN
  Administered 2023-03-05: 150 ug/kg/min via INTRAVENOUS

## 2023-03-05 MED ORDER — ONDANSETRON HCL 4 MG/2ML IJ SOLN
INTRAMUSCULAR | Status: DC | PRN
  Administered 2023-03-05: 4 mg via INTRAVENOUS

## 2023-03-05 MED ORDER — KETOROLAC TROMETHAMINE 30 MG/ML IJ SOLN
INTRAMUSCULAR | Status: AC
  Filled 2023-03-05: qty 1

## 2023-03-05 MED ORDER — ACETAMINOPHEN 500 MG PO TABS
ORAL_TABLET | ORAL | Status: AC
  Filled 2023-03-05: qty 2

## 2023-03-05 MED ORDER — DEXAMETHASONE SODIUM PHOSPHATE 10 MG/ML IJ SOLN
INTRAMUSCULAR | Status: DC | PRN
  Administered 2023-03-05: 5 mg via INTRAVENOUS

## 2023-03-05 MED ORDER — SODIUM CHLORIDE 0.9 % IR SOLN
Status: DC | PRN
  Administered 2023-03-05: 500 mL

## 2023-03-05 MED ORDER — ONDANSETRON HCL 4 MG/2ML IJ SOLN
INTRAMUSCULAR | Status: AC
  Filled 2023-03-05: qty 2

## 2023-03-05 MED ORDER — FENTANYL CITRATE (PF) 100 MCG/2ML IJ SOLN
25.0000 ug | INTRAMUSCULAR | Status: DC | PRN
  Administered 2023-03-05: 50 ug via INTRAVENOUS
  Administered 2023-03-05: 25 ug via INTRAVENOUS

## 2023-03-05 MED ORDER — OXYCODONE HCL 5 MG/5ML PO SOLN: 5.0000 mg | Freq: Once | ORAL | Status: DC | PRN

## 2023-03-05 MED ORDER — LACTATED RINGERS IV SOLN
INTRAVENOUS | Status: DC
  Administered 2023-03-05: 1000 mL via INTRAVENOUS

## 2023-03-05 MED ORDER — ONDANSETRON HCL 4 MG/2ML IJ SOLN: 4.0000 mg | Freq: Once | INTRAMUSCULAR | Status: DC | PRN

## 2023-03-05 MED ORDER — KETOROLAC TROMETHAMINE 30 MG/ML IJ SOLN: 30.0000 mg | Freq: Once | INTRAMUSCULAR | Status: AC | PRN

## 2023-03-05 MED ORDER — LACTATED RINGERS IV SOLN
INTRAVENOUS | Status: DC
  Filled 2023-03-05 (×2): qty 250

## 2023-03-05 MED ORDER — OXYCODONE HCL 5 MG PO TABS: 5.0000 mg | ORAL_TABLET | Freq: Once | ORAL | Status: DC | PRN

## 2023-03-05 MED ORDER — ACETAMINOPHEN 500 MG PO TABS
1000.0000 mg | ORAL_TABLET | Freq: Once | ORAL | Status: AC
  Administered 2023-03-05: 1000 mg via ORAL

## 2023-03-05 MED ORDER — PROPOFOL 10 MG/ML IV BOLUS
INTRAVENOUS | Status: DC | PRN
Start: 2023-03-05 — End: 2023-03-05
  Administered 2023-03-05: 20 mg via INTRAVENOUS
  Administered 2023-03-05: 180 mg via INTRAVENOUS

## 2023-03-05 MED ORDER — DEXAMETHASONE SODIUM PHOSPHATE 10 MG/ML IJ SOLN
INTRAMUSCULAR | Status: AC
  Filled 2023-03-05: qty 1

## 2023-03-05 SURGICAL SUPPLY — 23 items
BNDG CMPR 75X21 PLY HI ABS (MISCELLANEOUS)
DILATOR CANAL MILEX (MISCELLANEOUS) IMPLANT
DRSG TELFA 3X8 NADH STRL (GAUZE/BANDAGES/DRESSINGS) ×2 IMPLANT
GAUZE 4X4 16PLY ~~LOC~~+RFID DBL (SPONGE) ×4 IMPLANT
GAUZE PAD ABD 8X10 STRL (GAUZE/BANDAGES/DRESSINGS) ×2 IMPLANT
GAUZE STRETCH 2X75IN STRL (MISCELLANEOUS) IMPLANT
GLOVE BIO SURGEON STRL SZ7.5 (GLOVE) ×4 IMPLANT
GLOVE SURG SYN 6.5 ES PF (GLOVE) ×2 IMPLANT
GLOVE SURG SYN 6.5 PF PI (GLOVE) IMPLANT
GOWN STRL REUS W/ TWL LRG LVL3 (GOWN DISPOSABLE) IMPLANT
GOWN STRL REUS W/TWL LRG LVL3 (GOWN DISPOSABLE) ×4 IMPLANT
HOLDER FOLEY CATH W/STRAP (MISCELLANEOUS) ×2 IMPLANT
IV NS 1000ML (IV SOLUTION) ×2
IV NS 1000ML BAXH (IV SOLUTION) ×2 IMPLANT
IV SET ADMIN PUMP GEMINI W/NLD (IV SETS) ×2 IMPLANT
KIT TURNOVER CYSTO (KITS) ×2 IMPLANT
MAT PREVALON FULL STRYKER (MISCELLANEOUS) ×2 IMPLANT
PACK VAGINAL MINOR WOMEN LF (CUSTOM PROCEDURE TRAY) ×2 IMPLANT
PACKING VAGINAL (PACKING) IMPLANT
SLEEVE SCD COMPRESS KNEE MED (STOCKING) ×2 IMPLANT
TOWEL OR 17X24 6PK STRL BLUE (TOWEL DISPOSABLE) ×2 IMPLANT
TRAY FOLEY W/BAG SLVR 14FR LF (SET/KITS/TRAYS/PACK) ×2 IMPLANT
WATER STERILE IRR 500ML POUR (IV SOLUTION) ×2 IMPLANT

## 2023-03-05 NOTE — Patient Instructions (Signed)
IMMEDIATELY FOLLOWING SURGERY: Do not drive or operate machinery for the first twenty four hours after surgery. Do not make any important decisions for twenty four hours after surgery or while taking narcotic pain medications or sedatives. If you develop intractable nausea and vomiting or a severe headache please notify your doctor immediately.   FOLLOW-UP: You do not need to follow up with anesthesia unless specifically instructed to do so.   WOUND CARE INSTRUCTIONS (if applicable): Expect some mild vaginal bleeding, but if large amount of bleeding occurs please contact Dr. Roselind Messier at [phone] or the Radiation On-Call physician. Call for any fever greater than 101.0 degrees or increasing vaginal//abdominal pain or trouble urinating.   QUESTIONS?: Please feel free to call your physician or the hospital operator if you have any questions, and they will be happy to assist you. Resume all medications: as listed on your after visit summary. Your next appointment is:  Future Appointments  Date Time Provider Department Center  03/13/2023  7:00 AM [facility] 1 [facility] [place]  03/13/2023 10:30 AM Antony Blackbird, MD [facility] None  03/13/2023  2:00 PM Antony Blackbird, MD [facility] None  03/20/2023  7:00 AM [facility] 1 [facility] [place]  03/20/2023 10:00 AM Antony Blackbird, MD [facility] None  03/20/2023  2:00 PM Antony Blackbird, MD [facility] None  03/26/2023  8:00 AM [facility] 1 [facility]US [place]  03/26/2023 11:00 AM Antony Blackbird, MD [facility] None  03/26/2023  2:00 PM Antony Blackbird, MD [facility] None  04/02/2023  8:00 AM [facility] 1 [facility] [place]  04/02/2023 11:00 AM Antony Blackbird, MD [facility] None  04/02/2023  2:00 PM Antony Blackbird, MD [facility] None  04/04/2023 11:05 AM [facility]  04/24/2023  3:00 PM [name], MD [facility]  07/22/2023  1:45 PM [facility]-ANNUAL WELLNESS VISIT [facility]  01/19/2024 10:30 AM Nita Sickle K, DO [facility] None

## 2023-03-05 NOTE — Anesthesia Procedure Notes (Signed)
Procedure Name: LMA Insertion Date/Time: 03/05/2023 12:30 PM  Performed by: Briant Sites, CRNAPre-anesthesia Checklist: Patient identified, Emergency Drugs available, Suction available and Patient being monitored Patient Re-evaluated:Patient Re-evaluated prior to induction Oxygen Delivery Method: Circle system utilized Preoxygenation: Pre-oxygenation with 100% oxygen Induction Type: IV induction Ventilation: Mask ventilation without difficulty LMA: LMA inserted LMA Size: 4.0 Number of attempts: 1 Airway Equipment and Method: Bite block Placement Confirmation: positive ETCO2 Tube secured with: Tape Dental Injury: Teeth and Oropharynx as per pre-operative assessment  Comments: Rapid resaturation on induction, ventilated easily and lma placed

## 2023-03-05 NOTE — Anesthesia Postprocedure Evaluation (Signed)
Anesthesia Post Note  Patient: [name]  Procedure(s) Performed: TANDEM RING INSERTION (Cervix) OPERATIVE ULTRASOUND (Pelvis)     Patient location during evaluation: PACU Anesthesia Type: General Level of consciousness: awake and alert Pain management: pain level controlled Vital Signs Assessment: post-procedure vital signs reviewed and stable Respiratory status: spontaneous breathing, nonlabored ventilation, respiratory function stable and patient connected to nasal cannula oxygen Cardiovascular status: blood pressure returned to baseline and stable Postop Assessment: no apparent nausea or vomiting Anesthetic complications: no   No notable events documented.  Last Vitals:  Vitals:   03/05/23 1339 03/05/23 1345  BP:  (!) 145/79  Pulse: 78 77  Resp: 13 12  Temp:    SpO2: 96% (!) 87%    Last Pain:  Vitals:   03/05/23 1345  TempSrc:   PainSc: 3                  [name]

## 2023-03-05 NOTE — Interval H&P Note (Signed)
History and Physical Interval Note:  03/05/2023 12:20 PM  [name]  has presented today for surgery, with the diagnosis of CERVICAL CANCER.  The various methods of treatment have been discussed with the patient and family. After consideration of risks, benefits and other options for treatment, the patient has consented to  Procedure(s): TANDEM RING INSERTION (N/A) OPERATIVE ULTRASOUND (N/A) as a surgical intervention.  The patient's history has been reviewed, patient examined, no change in status, stable for surgery.  I have reviewed the patient's chart and labs.  Questions were answered to the patient's satisfaction.     Antony Blackbird

## 2023-03-05 NOTE — Op Note (Signed)
03/05/2023  1:31 PM  PATIENT:  [name]  72 y.o. female  PRE-OPERATIVE DIAGNOSIS:  CERVICAL CANCER  POST-OPERATIVE DIAGNOSIS:  CERVICAL CANCER  PROCEDURE:  Procedure(s): TANDEM RING INSERTION (N/A) OPERATIVE ULTRASOUND (N/A)  SURGEON:  Surgeons and Role:    Antony Blackbird, MD - Primary  PHYSICIAN ASSISTANT:   ASSISTANTS: none   ANESTHESIA:   general  EBL:  2 mL   BLOOD ADMINISTERED:none  DRAINS: Urinary Catheter (Foley)   LOCAL MEDICATIONS USED:  NONE  SPECIMEN:  No Specimen  DISPOSITION OF SPECIMEN:  N/A  COUNTS:  YES  TOURNIQUET:  * No tourniquets in log *  DICTATION: Patient was taken to [facility] room #4.  Patient was prepped and draped in the usual sterile fashion and placed in the dorsolithotomy position.  Timeout for the procedure, preoperative medications, antibiotics and allergies was performed.  Exam under anesthesia showed significant shrinkage of the cervical mass estimated to be approximately 3 to 4 cm in size.  No obvious parametrial extension bimanual and rectovaginal examination.  The cervical os was somewhat flush with the upper vaginal vault.  Some radiation changes noted along the cervical area.  The bladder was backfilled with approximately 250 cc of sterile saline for ultrasound imaging purposes.  on ultrasound the uterus was moderately anteverted.  The patient proceeded to undergo dilation and sounding.  The images were adequate but not ideal given the extensive bowel gas and the patient's body habitus.  The sound equipment could be identified in the midportion of the uterus and sounding proceeded to the fundus.  The uterus was estimated to be approximately 7 cm in length.  The cervical os was dilated.  The patient then had a 60 mm 45 degree tandem with the cervical sleeve placed within the endocervical and endometrial cavity.  Ultrasound imaging confirmed good placement.  The patient then had placement of a 45 degree cervical  ring attached to the tandem.  A small shielding cap was placed along the cervical ring.  A rectal paddle was then attached to the tandem ring apparatus.  Ultrasound at the conclusion of the procedure confirmed good placement of the tandem ring apparatus.  The patient tolerated the procedure well.  She was subsequently transferred to the recovery room in stable condition.  Later in the day the patient will be transferred to radiation oncology for planning and her first high-dose-rate treatment.  She is receiving 5.5 Wallace Cullens to the high risk clinical target volume.  Plan is for the patient to receive 5 high-dose-rate brachytherapy treatments to complete her definitive course of radiation therapy.  PLAN OF CARE:  Transfer to radiation oncology for planning and treatment  PATIENT DISPOSITION:  PACU - hemodynamically stable.   Delay start of Pharmacological VTE agent (>24hrs) due to surgical blood loss or risk of bleeding: not applicable

## 2023-03-05 NOTE — Transfer of Care (Signed)
Immediate Anesthesia Transfer of Care Note  Patient: [name]  Procedure(s) Performed: TANDEM RING INSERTION (Cervix) OPERATIVE ULTRASOUND (Pelvis)  Patient Location: PACU  Anesthesia Type:General  Level of Consciousness: drowsy  Airway & Oxygen Therapy: Patient Spontanous Breathing and Patient connected to nasal cannula oxygen  Post-op Assessment: Report given to RN  Post vital signs: Reviewed and stable  Last Vitals:  Vitals Value Taken Time  BP 137/68 03/05/23 1308  Temp    Pulse 86 03/05/23 1310  Resp 16 03/05/23 1310  SpO2 97 % 03/05/23 1310  Vitals shown include unfiled device data.  Last Pain:  Vitals:   03/05/23 1048  TempSrc: Oral  PainSc: 5       Patients Stated Pain Goal: 7 (03/05/23 1048)  Complications: No notable events documented.

## 2023-03-10 ENCOUNTER — Encounter (HOSPITAL_COMMUNITY): Payer: Self-pay

## 2023-03-10 ENCOUNTER — Encounter (HOSPITAL_BASED_OUTPATIENT_CLINIC_OR_DEPARTMENT_OTHER): Payer: Self-pay | Admitting: Radiation Oncology

## 2023-03-10 ENCOUNTER — Other Ambulatory Visit: Payer: Self-pay

## 2023-03-10 NOTE — Progress Notes (Signed)
Spoke w/ via phone for pre-op interview---pt Lab needs dos----none               Lab results------cbc, bmp 03-05-1023 epic, EKG 01-19-2023 epic, echo tee 09-30-2012 epic COVID test -----patient states asymptomatic no test needed Arrive at -------530 am [phone] NPO after MN NO Solid Food.  Clear liquids from MN until---430 am Med rec completed Medications to take morning of surgery -----gabapentin, metoprolol succinate Diabetic medication -----n/a Patient instructed no nail polish to be worn day of surgery Patient instructed to bring photo id and insurance card day of surgery Patient aware to have Driver (ride ) / caregiver   husband danny  for 24 hours after surgery  Patient Special Instructions -----none Pre-Op special Instructions -----none Patient verbalized understanding of instructions that were given at this phone interview. Patient denies shortness of breath, chest pain, fever, cough at this phone interview.

## 2023-03-12 ENCOUNTER — Other Ambulatory Visit: Payer: Self-pay | Admitting: Family Medicine

## 2023-03-12 ENCOUNTER — Other Ambulatory Visit (HOSPITAL_COMMUNITY): Payer: Self-pay

## 2023-03-12 NOTE — Progress Notes (Signed)
  Radiation Oncology         [phone] ________________________________  Name: [name] MRN: [id number]  Date: 03/13/2023  DOB: [birth year]  CC: [name], MD  [name], MD  HDR BRACHYTHERAPY NOTE  DIAGNOSIS: The encounter diagnosis was Malignant neoplasm of cervix, unspecified site [name]) [C53.9].    Cancer Staging  Cervical cancer [name]) Staging form: Cervix Uteri, AJCC Version 9 - Clinical stage from 12/24/2022: FIGO Stage IIIC2 (cT2b, cN2a, cM0) - Signed by Artis Delay, MD on 12/31/2022  NARRATIVE: The patient was brought to the HDR suite. Identity was confirmed. All relevant records and images related to the planned course of therapy were reviewed. The patient freely provided informed written consent to proceed with treatment after reviewing the details related to the planned course of therapy. The consent form was witnessed and verified by the simulation staff. Then, the patient was set-up in a stable reproducible supine position for radiation therapy. The tandem ring system was accessed and fiducial markers were placed within the tandem and ring.   Simple treatment device note: On the operating room table the patient had construction of her custom tandem ring system. She will be treated with a 45 tandem/ring system. The patient had placement of a 60 mm tandem. A cervical ring with a small shielding was used for her treatment. A rectal paddle was also part of her custom set up device.  Verification simulation note: An AP and lateral film was obtained through the pelvis area. This was compared to the patient's planning films documenting accurate position of the tandem/ring system for treatment.  High-dose-rate brachytherapy treatment note:   The remote afterloading device was accessed through catheter system and attached to the tandem ring system. Patient then proceeded to undergo her second high-dose-rate treatment directed at the cervix. The patient was  prescribed a dose of 5.5 gray to be delivered to the high risk clinical target volume.. Patient was treated with 2 channels using 28 dwell positions. Treatment time was 444.2 seconds. The patient tolerated the procedure well. After completion of her therapy, a radiation survey was performed documenting return of the iridium source into the GammaMed safe. The patient was then transferred to the nursing suite.  She then had removal of the rectal paddle followed by the tandem and ring system. The patient tolerated the removal well.  She did have a lot of pain in the lower back  in part related to her prior history of back issues and fusion.  She did need oral oxycodone 5 mg as well as 2 IV doses of Demerol to help control this pain after she was transported from the outpatient OR to begin the planning process and her second high-dose-rate treatment.   PLAN: The patient will return next week for her third high-dose-rate treatment.  ________________________________   -----------------------------------  Billie Lade, PhD, MD  This document serves as a record of services personally performed by Antony Blackbird, MD. It was created on his behalf by Neena Rhymes, a trained medical scribe. The creation of this record is based on the scribe's personal observations and the provider's statements to them. This document has been checked and approved by the attending provider.

## 2023-03-13 ENCOUNTER — Other Ambulatory Visit: Payer: Self-pay

## 2023-03-13 ENCOUNTER — Ambulatory Visit
Admission: RE | Admit: 2023-03-13 | Discharge: 2023-03-13 | Disposition: A | Discharge status: Home / Self Care | Payer: Medicare Other | Source: Ambulatory Visit | Attending: Radiation Oncology | Admitting: Radiation Oncology

## 2023-03-13 ENCOUNTER — Ambulatory Visit (HOSPITAL_BASED_OUTPATIENT_CLINIC_OR_DEPARTMENT_OTHER): Payer: Medicare Other | Admitting: Anesthesiology

## 2023-03-13 ENCOUNTER — Ambulatory Visit (HOSPITAL_BASED_OUTPATIENT_CLINIC_OR_DEPARTMENT_OTHER)
Admission: RE | Admit: 2023-03-13 | Discharge: 2023-03-13 | Disposition: A | Discharge status: Other Acute Inpatient Hospital | Payer: Medicare Other | Attending: Radiation Oncology | Admitting: Radiation Oncology

## 2023-03-13 ENCOUNTER — Encounter (HOSPITAL_BASED_OUTPATIENT_CLINIC_OR_DEPARTMENT_OTHER)
Admission: RE | Disposition: A | Discharge status: Other Acute Inpatient Hospital | Payer: Self-pay | Source: Home / Self Care | Attending: Radiation Oncology

## 2023-03-13 ENCOUNTER — Other Ambulatory Visit (HOSPITAL_COMMUNITY): Payer: Self-pay

## 2023-03-13 ENCOUNTER — Encounter (HOSPITAL_BASED_OUTPATIENT_CLINIC_OR_DEPARTMENT_OTHER): Payer: Self-pay | Admitting: Radiation Oncology

## 2023-03-13 ENCOUNTER — Ambulatory Visit (HOSPITAL_COMMUNITY)
Admission: RE | Admit: 2023-03-13 | Discharge: 2023-03-13 | Disposition: A | Discharge status: Home / Self Care | Payer: Medicare Other | Source: Ambulatory Visit | Attending: Radiation Oncology | Admitting: Radiation Oncology

## 2023-03-13 VITALS — BP 135/71 | HR 81 | Temp 97.5°F | Resp 19

## 2023-03-13 DIAGNOSIS — E785 Hyperlipidemia, unspecified: Secondary | ICD-10-CM | POA: Diagnosis not present

## 2023-03-13 DIAGNOSIS — I10 Essential (primary) hypertension: Secondary | ICD-10-CM

## 2023-03-13 DIAGNOSIS — I739 Peripheral vascular disease, unspecified: Secondary | ICD-10-CM | POA: Diagnosis not present

## 2023-03-13 DIAGNOSIS — Z95818 Presence of other cardiac implants and grafts: Secondary | ICD-10-CM | POA: Insufficient documentation

## 2023-03-13 DIAGNOSIS — C539 Malignant neoplasm of cervix uteri, unspecified: Secondary | ICD-10-CM

## 2023-03-13 DIAGNOSIS — Z7969 Long term (current) use of other immunomodulators and immunosuppressants: Secondary | ICD-10-CM | POA: Insufficient documentation

## 2023-03-13 DIAGNOSIS — G629 Polyneuropathy, unspecified: Secondary | ICD-10-CM | POA: Insufficient documentation

## 2023-03-13 DIAGNOSIS — Z87891 Personal history of nicotine dependence: Secondary | ICD-10-CM | POA: Insufficient documentation

## 2023-03-13 DIAGNOSIS — C538 Malignant neoplasm of overlapping sites of cervix uteri: Secondary | ICD-10-CM

## 2023-03-13 DIAGNOSIS — I4891 Unspecified atrial fibrillation: Secondary | ICD-10-CM | POA: Diagnosis not present

## 2023-03-13 DIAGNOSIS — I4819 Other persistent atrial fibrillation: Secondary | ICD-10-CM | POA: Diagnosis not present

## 2023-03-13 DIAGNOSIS — Z01818 Encounter for other preprocedural examination: Secondary | ICD-10-CM

## 2023-03-13 DIAGNOSIS — M069 Rheumatoid arthritis, unspecified: Secondary | ICD-10-CM | POA: Diagnosis not present

## 2023-03-13 DIAGNOSIS — Z79899 Other long term (current) drug therapy: Secondary | ICD-10-CM | POA: Insufficient documentation

## 2023-03-13 DIAGNOSIS — Z6841 Body Mass Index (BMI) 40.0 and over, adult: Secondary | ICD-10-CM | POA: Diagnosis not present

## 2023-03-13 DIAGNOSIS — Z7902 Long term (current) use of antithrombotics/antiplatelets: Secondary | ICD-10-CM | POA: Diagnosis not present

## 2023-03-13 HISTORY — PX: TANDEM RING INSERTION: SHX6199

## 2023-03-13 HISTORY — PX: OPERATIVE ULTRASOUND: SHX5996

## 2023-03-13 LAB — RAD ONC ARIA SESSION SUMMARY
Course Elapsed Days: 58
Plan Fractions Treated to Date: 1
Plan Prescribed Dose Per Fraction: 5.5 Gy
Plan Total Fractions Prescribed: 1
Plan Total Prescribed Dose: 5.5 Gy
Reference Point Dosage Given to Date: 10.8823 Gy
Reference Point Dosage Given to Date: 11 Gy
Reference Point Dosage Given to Date: 20.0569 Gy
Reference Point Dosage Given to Date: 23.8513 Gy
Reference Point Dosage Given to Date: 9.8446 Gy
Reference Point Session Dosage Given: 10.703 Gy
Reference Point Session Dosage Given: 12.8291 Gy
Reference Point Session Dosage Given: 5.1928 Gy
Reference Point Session Dosage Given: 5.5 Gy
Reference Point Session Dosage Given: 5.8385 Gy
Session Number: 32

## 2023-03-13 SURGERY — INSERTION, UTERINE TANDEM AND RING OR CYLINDER, FOR BRACHYTHERAPY
Anesthesia: General

## 2023-03-13 MED ORDER — ONDANSETRON HCL 4 MG/2ML IJ SOLN
INTRAMUSCULAR | Status: AC
  Filled 2023-03-13: qty 2

## 2023-03-13 MED ORDER — LIDOCAINE HCL (PF) 2 % IJ SOLN
INTRAMUSCULAR | Status: AC
  Filled 2023-03-13: qty 5

## 2023-03-13 MED ORDER — LACTATED RINGERS IV SOLN
INTRAVENOUS | Status: DC
  Filled 2023-03-13 (×2): qty 250

## 2023-03-13 MED ORDER — FERROUS SULFATE 325 (65 FE) MG PO TABS
325.0000 mg | ORAL_TABLET | Freq: Every day | ORAL | 0 refills | Status: DC
  Filled 2023-03-13: qty 100, 100d supply, fill #0

## 2023-03-13 MED ORDER — ARTIFICIAL TEARS OPHTHALMIC OINT
TOPICAL_OINTMENT | OPHTHALMIC | Status: AC
  Filled 2023-03-13: qty 3.5

## 2023-03-13 MED ORDER — ONDANSETRON HCL 4 MG/2ML IJ SOLN
INTRAMUSCULAR | Status: DC | PRN
  Administered 2023-03-13: 4 mg via INTRAVENOUS

## 2023-03-13 MED ORDER — PHENYLEPHRINE 80 MCG/ML (10ML) SYRINGE FOR IV PUSH (FOR BLOOD PRESSURE SUPPORT)
PREFILLED_SYRINGE | INTRAVENOUS | Status: DC | PRN
  Administered 2023-03-13: 160 ug via INTRAVENOUS
  Administered 2023-03-13: 240 ug via INTRAVENOUS
  Administered 2023-03-13 (×2): 160 ug via INTRAVENOUS

## 2023-03-13 MED ORDER — DEXAMETHASONE SODIUM PHOSPHATE 10 MG/ML IJ SOLN
INTRAMUSCULAR | Status: DC | PRN
  Administered 2023-03-13: 5 mg via INTRAVENOUS

## 2023-03-13 MED ORDER — PROPOFOL 10 MG/ML IV BOLUS
INTRAVENOUS | Status: AC
  Filled 2023-03-13: qty 20

## 2023-03-13 MED ORDER — AMISULPRIDE (ANTIEMETIC) 5 MG/2ML IV SOLN: 10.0000 mg | Freq: Once | INTRAVENOUS | Status: DC | PRN

## 2023-03-13 MED ORDER — MEPERIDINE HCL 25 MG/ML IJ SOLN
25.0000 mg | Freq: Once | INTRAMUSCULAR | Status: AC
  Administered 2023-03-13: 25 mg via INTRAVENOUS
  Filled 2023-03-13: qty 1

## 2023-03-13 MED ORDER — OXYCODONE HCL 5 MG/5ML PO SOLN: 5.0000 mg | Freq: Once | ORAL | Status: DC | PRN

## 2023-03-13 MED ORDER — MIDAZOLAM HCL 2 MG/2ML IJ SOLN
INTRAMUSCULAR | Status: AC
  Filled 2023-03-13: qty 2

## 2023-03-13 MED ORDER — LIDOCAINE 2% (20 MG/ML) 5 ML SYRINGE
INTRAMUSCULAR | Status: DC | PRN
  Administered 2023-03-13: 100 mg via INTRAVENOUS

## 2023-03-13 MED ORDER — DEXAMETHASONE SODIUM PHOSPHATE 10 MG/ML IJ SOLN
INTRAMUSCULAR | Status: AC
  Filled 2023-03-13: qty 1

## 2023-03-13 MED ORDER — PROPOFOL 10 MG/ML IV BOLUS
INTRAVENOUS | Status: DC | PRN
Start: 2023-03-13 — End: 2023-03-13
  Administered 2023-03-13: 200 mg via INTRAVENOUS

## 2023-03-13 MED ORDER — KETOROLAC TROMETHAMINE 30 MG/ML IJ SOLN
INTRAMUSCULAR | Status: AC
  Filled 2023-03-13: qty 1

## 2023-03-13 MED ORDER — FENTANYL CITRATE (PF) 100 MCG/2ML IJ SOLN
INTRAMUSCULAR | Status: DC | PRN
  Administered 2023-03-13: 50 ug via INTRAVENOUS

## 2023-03-13 MED ORDER — FENTANYL CITRATE (PF) 100 MCG/2ML IJ SOLN
INTRAMUSCULAR | Status: AC
  Filled 2023-03-13: qty 2

## 2023-03-13 MED ORDER — LACTATED RINGERS IV SOLN: INTRAVENOUS | Status: DC

## 2023-03-13 MED ORDER — MIDAZOLAM HCL 5 MG/5ML IJ SOLN
INTRAMUSCULAR | Status: DC | PRN
  Administered 2023-03-13: 2 mg via INTRAVENOUS

## 2023-03-13 MED ORDER — KETOROLAC TROMETHAMINE 30 MG/ML IJ SOLN
INTRAMUSCULAR | Status: DC | PRN
  Administered 2023-03-13: 30 mg via INTRAVENOUS

## 2023-03-13 MED ORDER — OXYCODONE HCL 5 MG PO TABS: 5.0000 mg | ORAL_TABLET | Freq: Once | ORAL | Status: DC | PRN

## 2023-03-13 MED ORDER — OXYCODONE HCL 5 MG PO TABS
5.0000 mg | ORAL_TABLET | Freq: Once | ORAL | Status: AC
  Administered 2023-03-13: 5 mg via ORAL
  Filled 2023-03-13: qty 1

## 2023-03-13 MED ORDER — PHENYLEPHRINE 80 MCG/ML (10ML) SYRINGE FOR IV PUSH (FOR BLOOD PRESSURE SUPPORT)
PREFILLED_SYRINGE | INTRAVENOUS | Status: AC
  Filled 2023-03-13: qty 10

## 2023-03-13 MED ORDER — SODIUM CHLORIDE 0.9 % IR SOLN
Status: DC | PRN
  Administered 2023-03-13: 500 mL
  Administered 2023-03-13: 1000 mL via INTRAVESICAL

## 2023-03-13 SURGICAL SUPPLY — 20 items
BNDG CMPR 75X21 PLY HI ABS (MISCELLANEOUS)
DILATOR CANAL MILEX (MISCELLANEOUS) IMPLANT
DRSG TELFA 3X8 NADH STRL (GAUZE/BANDAGES/DRESSINGS) ×1 IMPLANT
GAUZE 4X4 16PLY ~~LOC~~+RFID DBL (SPONGE) ×2 IMPLANT
GAUZE PAD ABD 8X10 STRL (GAUZE/BANDAGES/DRESSINGS) ×1 IMPLANT
GAUZE STRETCH 2X75IN STRL (MISCELLANEOUS) IMPLANT
GLOVE BIO SURGEON STRL SZ7.5 (GLOVE) ×2 IMPLANT
GOWN STRL REUS W/TWL LRG LVL3 (GOWN DISPOSABLE) ×1 IMPLANT
HOLDER FOLEY CATH W/STRAP (MISCELLANEOUS) ×1 IMPLANT
IV NS 1000ML (IV SOLUTION) ×1
IV NS 1000ML BAXH (IV SOLUTION) ×1 IMPLANT
IV SET ADMIN PUMP GEMINI W/NLD (IV SETS) ×1 IMPLANT
KIT TURNOVER CYSTO (KITS) ×1 IMPLANT
MAT PREVALON FULL STRYKER (MISCELLANEOUS) ×1 IMPLANT
PACK VAGINAL MINOR WOMEN LF (CUSTOM PROCEDURE TRAY) ×1 IMPLANT
PACKING VAGINAL (PACKING) IMPLANT
SLEEVE SCD COMPRESS KNEE MED (STOCKING) ×1 IMPLANT
TOWEL OR 17X24 6PK STRL BLUE (TOWEL DISPOSABLE) ×1 IMPLANT
TRAY FOLEY W/BAG SLVR 14FR LF (SET/KITS/TRAYS/PACK) ×1 IMPLANT
WATER STERILE IRR 500ML POUR (IV SOLUTION) ×1 IMPLANT

## 2023-03-13 NOTE — Anesthesia Postprocedure Evaluation (Signed)
Anesthesia Post Note  Patient: [name]  Procedure(s) Performed: TANDEM RING INSERTION OPERATIVE ULTRASOUND     Patient location during evaluation: PACU Anesthesia Type: General Level of consciousness: awake and alert Pain management: pain level controlled Vital Signs Assessment: post-procedure vital signs reviewed and stable Respiratory status: spontaneous breathing, nonlabored ventilation and respiratory function stable Cardiovascular status: blood pressure returned to baseline and stable Postop Assessment: no apparent nausea or vomiting Anesthetic complications: no   No notable events documented.  Last Vitals:  Vitals:   03/13/23 0912 03/13/23 0915  BP:  (!) 140/75  Pulse: 81 69  Resp: 19 16  Temp:  36.9 C  SpO2: 97% 93%    Last Pain:  Vitals:   03/13/23 0915  TempSrc:   PainSc: 0-No pain                 Lowella Curb

## 2023-03-13 NOTE — Transfer of Care (Signed)
Immediate Anesthesia Transfer of Care Note  Patient: [name]  Procedure(s) Performed: TANDEM RING INSERTION OPERATIVE ULTRASOUND  Patient Location: PACU  Anesthesia Type:General  Level of Consciousness: awake, alert , oriented, and patient cooperative  Airway & Oxygen Therapy: Patient Spontanous Breathing and Patient connected to nasal cannula oxygen  Post-op Assessment: Report given to RN and Post -op Vital signs reviewed and stable  Post vital signs: Reviewed and stable  Last Vitals:  Vitals Value Taken Time  BP 130/72 03/13/23 0815  Temp    Pulse 85 03/13/23 0817  Resp 13 03/13/23 0817  SpO2 98 % 03/13/23 0817  Vitals shown include unfiled device data.  Last Pain:  Vitals:   03/13/23 0552  TempSrc: Oral  PainSc: 0-No pain      Patients Stated Pain Goal: 5 (03/13/23 0552)  Complications: No notable events documented.

## 2023-03-13 NOTE — Op Note (Signed)
03/13/2023  8:31 AM  PATIENT:  [name]  72 y.o. female  PRE-OPERATIVE DIAGNOSIS:  CERVIX CANCER  POST-OPERATIVE DIAGNOSIS:  CERVIX CANCER  PROCEDURE:  Procedure(s): TANDEM RING INSERTION (N/A) OPERATIVE ULTRASOUND (N/A)  SURGEON:  Surgeons and Role:    * Antony Blackbird, MD - Primary  PHYSICIAN ASSISTANT:   ASSISTANTS: none   ANESTHESIA:   general  EBL:  5 mL   BLOOD ADMINISTERED:none  DRAINS: Urinary Catheter (Foley)   LOCAL MEDICATIONS USED:  NONE  SPECIMEN:  No Specimen  DISPOSITION OF SPECIMEN:  N/A  COUNTS:  YES  TOURNIQUET:  * No tourniquets in log *  DICTATION: Patient was taken to [facility] room #4.  Patient was prepped and draped in the usual sterile fashion and placed in the dorsolithotomy position.  Timeout for the procedure, preoperative medications, antibiotics and allergies was performed.  Exam under anesthesia showed significant shrinkage of the cervical mass estimated to be approximately 3 to 4 cm in size.  No obvious parametrial extension bimanual and rectovaginal examination.  The cervical os was somewhat flush with the upper vaginal vault.  Some radiation changes noted along the cervical area.  The bladder was backfilled with approximately 250 cc of sterile saline for ultrasound imaging purposes.  on ultrasound the uterus was moderately anteverted.  The patient proceeded to undergo dilation and sounding.  The ultrasound images were much better this week.  The sound equipment could be identified in the midportion of the uterus and sounding proceeded to the fundus.  The uterus was estimated to be approximately 7 cm in length.  The cervical os was dilated.  The patient then had a 60 mm 45 degree tandem with the cervical sleeve placed within the endocervical and endometrial cavity.  Ultrasound imaging confirmed good placement.  The patient then had placement of a 45 degree cervical ring attached to the tandem.  A small shielding cap was  placed along the cervical ring.  A rectal paddle was then attached to the tandem ring apparatus.  Ultrasound at the conclusion of the procedure confirmed good placement of the tandem ring apparatus.  On ultrasound imaging the cervical mass was estimated to be approximately 3 x 4 cm.  the patient tolerated the procedure well.  She was subsequently transferred to the recovery room in stable condition.  Later in the day the patient will be transferred to radiation oncology for planning and her second high-dose-rate treatment.  She is receiving 5.5 Wallace Cullens to the high risk clinical target volume.  Plan is for the patient to receive 5 high-dose-rate brachytherapy treatments to complete her definitive course of radiation therapy.   PLAN OF CARE:  Transfer to radiation oncology for planning and treatment  PATIENT DISPOSITION:  PACU - hemodynamically stable.   Delay start of Pharmacological VTE agent (>24hrs) due to surgical blood loss or risk of bleeding: not applicable

## 2023-03-13 NOTE — Patient Instructions (Signed)
IMMEDIATELY FOLLOWING SURGERY: Do not drive or operate machinery for the first twenty four hours after surgery. Do not make any important decisions for twenty four hours after surgery or while taking narcotic pain medications or sedatives. If you develop intractable nausea and vomiting or a severe headache please notify your doctor immediately.   FOLLOW-UP: You do not need to follow up with anesthesia unless specifically instructed to do so.   WOUND CARE INSTRUCTIONS (if applicable): Expect some mild vaginal bleeding, but if large amount of bleeding occurs please contact Dr. Roselind Messier at [phone] or the Radiation On-Call physician. Call for any fever greater than 101.0 degrees or increasing vaginal//abdominal pain or trouble urinating.   QUESTIONS?: Please feel free to call your physician or the hospital operator if you have any questions, and they will be happy to assist you. Resume all medications: as listed on your after visit summary. Your next appointment is:  Future Appointments  Date Time Provider Department Center  03/20/2023  7:00 AM [facility] 1 [facility] [place]  03/20/2023 10:00 AM Antony Blackbird, MD [facility] None  03/20/2023  2:00 PM Antony Blackbird, MD [facility] None  03/26/2023  8:00 AM [facility] 1 [facility] [place]  03/26/2023 11:00 AM Antony Blackbird, MD [facility] None  03/26/2023  2:00 PM Antony Blackbird, MD [facility] None  04/02/2023  8:00 AM [facility] 1 [facility]   04/02/2023 11:00 AM Antony Blackbird, MD [facility] None  04/02/2023  2:00 PM Antony Blackbird, MD [facility] None  04/04/2023 11:05 AM [facility]  04/24/2023  3:00 PM [name], MD [facility]  07/22/2023  1:45 PM [facility]ANNUAL WELLNESS VISIT [facility]  01/19/2024 10:30 AM Nita Sickle K, DO [facility] None

## 2023-03-13 NOTE — Progress Notes (Signed)
Patient reports having severe pain to pelvis and lower back. No relief from Oxycodone 5mg . Order obtained to give Demerol 25mg . Patient  vitals monitored prior to medication administration.  Vitals were stable. Waited with patient for 10 mins. Patient denies any adverse reactions.   BP 135/71   Pulse 81   Temp (!) 97.5 F (36.4 C)   Resp 19   SpO2 94%

## 2023-03-13 NOTE — Anesthesia Preprocedure Evaluation (Signed)
Anesthesia Evaluation  Patient identified by MRN, date of birth, ID band Patient awake    Reviewed: Allergy & Precautions, NPO status , Patient's Chart, lab work & pertinent test results  History of Anesthesia Complications (+) PONV and history of anesthetic complications  Airway Mallampati: IV  TM Distance: >3 FB Neck ROM: Full    Dental no notable dental hx. (+) Missing, Dental Advisory Given, Partial Upper, Partial Lower,    Pulmonary neg shortness of breath, neg sleep apnea (pt told she has sleep apnea says study equivocal), neg COPD, neg recent URI, former smoker   Pulmonary exam normal breath sounds clear to auscultation       Cardiovascular hypertension, Pt. on medications and Pt. on home beta blockers + Peripheral Vascular Disease  Normal cardiovascular exam+ dysrhythmias Atrial Fibrillation  Rhythm:Regular Rate:Normal   1. Left ventricular ejection fraction, by estimation, is 55 to 60%. The  left ventricle has normal function. The left ventricle has no regional  wall motion abnormalities. The left ventricular internal cavity size was  mildly dilated. There is mild left  ventricular hypertrophy. Left ventricular diastolic function could not be  evaluated.   2. Right ventricular systolic function is mildly reduced. The right  ventricular size is mildly enlarged. There is mildly elevated pulmonary  artery systolic pressure.   3. The mitral valve is normal in structure. Mild mitral valve  regurgitation. No evidence of mitral stenosis.   4. The aortic valve is tricuspid. Aortic valve regurgitation is trivial.  Aortic valve sclerosis is present, with no evidence of aortic valve  stenosis.   5. Aortic dilatation noted. There is mild dilatation of the aortic root,  measuring 41 mm. There is mild dilatation of the ascending aorta,  measuring 41 mm.   6. The inferior vena cava is dilated in size with >50% respiratory   variability, suggesting right atrial pressure of 8 mmHg.      Neuro/Psych  Neuromuscular disease  negative psych ROS   GI/Hepatic Neg liver ROS,GERD  ,,  Endo/Other    Morbid obesity  Renal/GU Lab Results      Component                Value               Date                      NA                       139                 02/04/2023                CL                       100                 02/04/2023                K                        3.6                 02/04/2023                   CREATININE  0.97                02/04/2023                              Musculoskeletal  (+) Arthritis , Osteoarthritis,    Abdominal  (+) + obese  Peds  Hematology  (+) Blood dyscrasia, anemia Lab Results      Component                Value               Date                      WBC                      4.0                 02/04/2023                HGB                      11.8 (L)            02/04/2023                HCT                      35.4 (L)            02/04/2023                MCV                      88.9                02/04/2023                PLT                      176                 02/04/2023            eliquis   Anesthesia Other Findings ALL: Tramadol, Amlodipine, nortryptyline, hydromrophine  Reproductive/Obstetrics                             Anesthesia Physical Anesthesia Plan  ASA: 3  Anesthesia Plan: General   Post-op Pain Management: Minimal or no pain anticipated   Induction: Intravenous  PONV Risk Score and Plan: 4 or greater and Ondansetron, Dexamethasone, TIVA, Treatment may vary due to age or medical condition and Propofol infusion  Airway Management Planned: LMA  Additional Equipment:   Intra-op Plan:   Post-operative Plan: Extubation in OR  Informed Consent: I have reviewed the patients History and Physical, chart, labs and discussed the procedure including the risks, benefits and alternatives  for the proposed anesthesia with the patient or authorized representative who has indicated his/her understanding and acceptance.     Dental advisory given  Plan Discussed with: CRNA  Anesthesia Plan Comments:         Anesthesia Quick Evaluation

## 2023-03-13 NOTE — Interval H&P Note (Signed)
History and Physical Interval Note:  03/13/2023 7:26 AM  [name]  has presented today for surgery, with the diagnosis of CERVIX CANCER.  The various methods of treatment have been discussed with the patient and family. After consideration of risks, benefits and other options for treatment, the patient has consented to  Procedure(s): TANDEM RING INSERTION (N/A) OPERATIVE ULTRASOUND (N/A) as a surgical intervention.  The patient's history has been reviewed, patient examined, no change in status, stable for surgery.  I have reviewed the patient's chart and labs.  Questions were answered to the patient's satisfaction.     Antony Blackbird

## 2023-03-13 NOTE — Anesthesia Procedure Notes (Signed)
Procedure Name: LMA Insertion Date/Time: 03/13/2023 7:36 AM  Performed by: Bishop Limbo, CRNAPre-anesthesia Checklist: Patient identified, Emergency Drugs available, Suction available and Patient being monitored Patient Re-evaluated:Patient Re-evaluated prior to induction Oxygen Delivery Method: Circle System Utilized Preoxygenation: Pre-oxygenation with 100% oxygen Induction Type: IV induction Ventilation: Mask ventilation without difficulty LMA: LMA inserted LMA Size: 4.0 Number of attempts: 1 Placement Confirmation: positive ETCO2 Tube secured with: Tape Dental Injury: Teeth and Oropharynx as per pre-operative assessment

## 2023-03-14 ENCOUNTER — Encounter (HOSPITAL_COMMUNITY): Payer: Self-pay

## 2023-03-14 ENCOUNTER — Encounter (HOSPITAL_BASED_OUTPATIENT_CLINIC_OR_DEPARTMENT_OTHER): Payer: Self-pay | Admitting: Radiation Oncology

## 2023-03-14 ENCOUNTER — Other Ambulatory Visit: Payer: Self-pay

## 2023-03-14 NOTE — Progress Notes (Signed)
Spoke w/ via phone for pre-op interview---pt Lab needs dos----none               Lab results------cbc, bmp 03-05-1023 epic, EKG 01-19-2023 epic, echo tee 09-30-2012 epic COVID test -----patient states asymptomatic no test needed Arrive at -------530 am 03-20-2023 NPO after MN NO Solid Food.  Clear liquids from MN until---430 am Med rec completed Medications to take morning of surgery -----gabapentin, metoprolol succinate Diabetic medication -----n/a Patient instructed no nail polish to be worn day of surgery Patient instructed to bring photo id and insurance card day of surgery Patient aware to have Driver (ride ) / caregiver   husband [name]  for 24 hours after surgery  Patient Special Instructions -----none Pre-Op special Instructions -----none Patient verbalized understanding of instructions that were given at this phone interview. Patient denies shortness of breath, chest pain, fever, cough at this phone interview.

## 2023-03-19 ENCOUNTER — Other Ambulatory Visit: Payer: Self-pay

## 2023-03-19 NOTE — Anesthesia Preprocedure Evaluation (Signed)
Anesthesia Evaluation  Patient identified by MRN, date of birth, ID band Patient awake    Reviewed: Allergy & Precautions, H&P , NPO status , Patient's Chart, lab work & pertinent test results  History of Anesthesia Complications (+) PONV and history of anesthetic complications  Airway Mallampati: III  TM Distance: >3 FB Neck ROM: Full    Dental no notable dental hx. (+) Teeth Intact, Dental Advisory Given   Pulmonary neg pulmonary ROS, former smoker   Pulmonary exam normal breath sounds clear to auscultation       Cardiovascular Exercise Tolerance: Good hypertension, Pt. on medications and Pt. on home beta blockers + Peripheral Vascular Disease  + dysrhythmias Atrial Fibrillation  Rhythm:Regular Rate:Normal     Neuro/Psych negative neurological ROS  negative psych ROS   GI/Hepatic Neg liver ROS,GERD  Medicated,,  Endo/Other    Morbid obesity  Renal/GU negative Renal ROS  negative genitourinary   Musculoskeletal  (+) Arthritis , Osteoarthritis,    Abdominal   Peds  Hematology  (+) Blood dyscrasia, anemia   Anesthesia Other Findings   Reproductive/Obstetrics negative OB ROS                             Anesthesia Physical Anesthesia Plan  ASA: 3  Anesthesia Plan: General   Post-op Pain Management: Tylenol PO (pre-op)*   Induction: Intravenous  PONV Risk Score and Plan: 4 or greater and Ondansetron, Dexamethasone, Propofol infusion and TIVA  Airway Management Planned: LMA  Additional Equipment:   Intra-op Plan:   Post-operative Plan: Extubation in OR  Informed Consent: I have reviewed the patients History and Physical, chart, labs and discussed the procedure including the risks, benefits and alternatives for the proposed anesthesia with the patient or authorized representative who has indicated his/her understanding and acceptance.     Dental advisory given  Plan  Discussed with: CRNA  Anesthesia Plan Comments:        Anesthesia Quick Evaluation

## 2023-03-19 NOTE — Progress Notes (Signed)
  Radiation Oncology         [phone] ________________________________  Name: [name] MRN: [id number]  Date: 03/20/2023  DOB: 1951/05/03  CC: [name], MD  [name], MD  HDR BRACHYTHERAPY NOTE  DIAGNOSIS: The encounter diagnosis was Malignant neoplasm of cervix, unspecified site [facility]) [C53.9].    Cancer Staging  Cervical cancer [facility]) Staging form: Cervix Uteri, AJCC Version 9 - Clinical stage from 12/24/2022: FIGO Stage IIIC2 (cT2b, cN2a, cM0) - Signed by Artis Delay, MD on 12/31/2022  NARRATIVE: The patient was brought to the HDR suite. Identity was confirmed. All relevant records and images related to the planned course of therapy were reviewed. The patient freely provided informed written consent to proceed with treatment after reviewing the details related to the planned course of therapy. The consent form was witnessed and verified by the simulation staff. Then, the patient was set-up in a stable reproducible supine position for radiation therapy. The tandem ring system was accessed and fiducial markers were placed within the tandem and ring.   Simple treatment device note: On the operating room table the patient had construction of her custom tandem ring system. She will be treated with a 45 tandem/ring system. The patient had placement of a 60 mm tandem. A cervical ring with a small shielding was used for her treatment. A rectal paddle was also part of her custom set up device.  Verification simulation note: An AP and lateral film was obtained through the pelvis area. This was compared to the patient's planning films documenting accurate position of the tandem/ring system for treatment.  High-dose-rate brachytherapy treatment note:   The remote afterloading device was accessed through catheter system and attached to the tandem ring system. Patient then proceeded to undergo her third high-dose-rate treatment directed at the cervix. The patient was  prescribed a dose of 5.5 gray to be delivered to the high risk clinical target volume.. Patient was treated with 2 channels using 28 dwell positions. Treatment time was *** seconds. The patient tolerated the procedure well. After completion of her therapy, a radiation survey was performed documenting return of the iridium source into the GammaMed safe. The patient was then transferred to the nursing suite.  She then had removal of the rectal paddle followed by the tandem and ring system. The patient tolerated the removal well.  She did have a lot of pain in the lower back  in part related to her prior history of back issues and fusion.  She did need oral oxycodone 5 mg as well as 2 IV doses of Demerol to help control this pain after she was transported from the outpatient OR to begin the planning process and her second high-dose-rate treatment.   PLAN: The patient will return next week for her fourth high-dose-rate treatment.  ________________________________   -----------------------------------  Billie Lade, PhD, MD  This document serves as a record of services personally performed by Antony Blackbird, MD. It was created on his behalf by Neena Rhymes, a trained medical scribe. The creation of this record is based on the scribe's personal observations and the provider's statements to them. This document has been checked and approved by the attending provider.

## 2023-03-20 ENCOUNTER — Encounter (HOSPITAL_BASED_OUTPATIENT_CLINIC_OR_DEPARTMENT_OTHER)
Admission: RE | Disposition: A | Discharge status: Other Acute Inpatient Hospital | Payer: Self-pay | Source: Home / Self Care | Attending: Radiation Oncology

## 2023-03-20 ENCOUNTER — Ambulatory Visit (HOSPITAL_BASED_OUTPATIENT_CLINIC_OR_DEPARTMENT_OTHER): Payer: Medicare Other | Admitting: Anesthesiology

## 2023-03-20 ENCOUNTER — Ambulatory Visit (HOSPITAL_COMMUNITY)
Admission: RE | Admit: 2023-03-20 | Discharge: 2023-03-20 | Disposition: A | Discharge status: Home / Self Care | Payer: Medicare Other | Source: Ambulatory Visit | Attending: Radiation Oncology | Admitting: Radiation Oncology

## 2023-03-20 ENCOUNTER — Other Ambulatory Visit: Payer: Self-pay

## 2023-03-20 ENCOUNTER — Ambulatory Visit
Admission: RE | Admit: 2023-03-20 | Discharge: 2023-03-20 | Disposition: A | Discharge status: Home / Self Care | Payer: Medicare Other | Source: Ambulatory Visit | Attending: Radiation Oncology | Admitting: Radiation Oncology

## 2023-03-20 ENCOUNTER — Encounter (HOSPITAL_BASED_OUTPATIENT_CLINIC_OR_DEPARTMENT_OTHER): Payer: Self-pay | Admitting: Radiation Oncology

## 2023-03-20 ENCOUNTER — Ambulatory Visit (HOSPITAL_BASED_OUTPATIENT_CLINIC_OR_DEPARTMENT_OTHER)
Admission: RE | Admit: 2023-03-20 | Discharge: 2023-03-20 | Disposition: A | Discharge status: Other Acute Inpatient Hospital | Payer: Medicare Other | Attending: Radiation Oncology | Admitting: Radiation Oncology

## 2023-03-20 VITALS — BP 145/70 | HR 78 | Temp 97.8°F

## 2023-03-20 DIAGNOSIS — Z87891 Personal history of nicotine dependence: Secondary | ICD-10-CM | POA: Insufficient documentation

## 2023-03-20 DIAGNOSIS — I4819 Other persistent atrial fibrillation: Secondary | ICD-10-CM | POA: Diagnosis not present

## 2023-03-20 DIAGNOSIS — I739 Peripheral vascular disease, unspecified: Secondary | ICD-10-CM | POA: Insufficient documentation

## 2023-03-20 DIAGNOSIS — C539 Malignant neoplasm of cervix uteri, unspecified: Secondary | ICD-10-CM

## 2023-03-20 DIAGNOSIS — C538 Malignant neoplasm of overlapping sites of cervix uteri: Secondary | ICD-10-CM | POA: Diagnosis present

## 2023-03-20 DIAGNOSIS — I10 Essential (primary) hypertension: Secondary | ICD-10-CM

## 2023-03-20 DIAGNOSIS — G629 Polyneuropathy, unspecified: Secondary | ICD-10-CM | POA: Insufficient documentation

## 2023-03-20 DIAGNOSIS — Z95818 Presence of other cardiac implants and grafts: Secondary | ICD-10-CM | POA: Diagnosis not present

## 2023-03-20 DIAGNOSIS — K219 Gastro-esophageal reflux disease without esophagitis: Secondary | ICD-10-CM | POA: Insufficient documentation

## 2023-03-20 DIAGNOSIS — I4891 Unspecified atrial fibrillation: Secondary | ICD-10-CM | POA: Insufficient documentation

## 2023-03-20 DIAGNOSIS — M1990 Unspecified osteoarthritis, unspecified site: Secondary | ICD-10-CM | POA: Insufficient documentation

## 2023-03-20 DIAGNOSIS — Z79899 Other long term (current) drug therapy: Secondary | ICD-10-CM | POA: Diagnosis not present

## 2023-03-20 DIAGNOSIS — M069 Rheumatoid arthritis, unspecified: Secondary | ICD-10-CM | POA: Insufficient documentation

## 2023-03-20 DIAGNOSIS — D649 Anemia, unspecified: Secondary | ICD-10-CM | POA: Insufficient documentation

## 2023-03-20 DIAGNOSIS — Z01818 Encounter for other preprocedural examination: Secondary | ICD-10-CM

## 2023-03-20 DIAGNOSIS — Z6841 Body Mass Index (BMI) 40.0 and over, adult: Secondary | ICD-10-CM | POA: Insufficient documentation

## 2023-03-20 HISTORY — PX: OPERATIVE ULTRASOUND: SHX5996

## 2023-03-20 HISTORY — PX: TANDEM RING INSERTION: SHX6199

## 2023-03-20 LAB — RAD ONC ARIA SESSION SUMMARY
Course Elapsed Days: 65
Plan Fractions Treated to Date: 1
Plan Prescribed Dose Per Fraction: 5.5 Gy
Plan Total Fractions Prescribed: 1
Plan Total Prescribed Dose: 5.5 Gy
Reference Point Dosage Given to Date: 14.3844 Gy
Reference Point Dosage Given to Date: 15.77 Gy
Reference Point Dosage Given to Date: 16.5 Gy
Reference Point Dosage Given to Date: 29.0553 Gy
Reference Point Dosage Given to Date: 33.7367 Gy
Reference Point Session Dosage Given: 4.5398 Gy
Reference Point Session Dosage Given: 4.8877 Gy
Reference Point Session Dosage Given: 5.5 Gy
Reference Point Session Dosage Given: 8.9983 Gy
Reference Point Session Dosage Given: 9.8854 Gy
Session Number: 33

## 2023-03-20 SURGERY — INSERTION, UTERINE TANDEM AND RING OR CYLINDER, FOR BRACHYTHERAPY
Anesthesia: General

## 2023-03-20 MED ORDER — DEXMEDETOMIDINE HCL IN NACL 80 MCG/20ML IV SOLN
INTRAVENOUS | Status: DC | PRN
  Administered 2023-03-20 (×2): 4 ug via INTRAVENOUS

## 2023-03-20 MED ORDER — LIDOCAINE 2% (20 MG/ML) 5 ML SYRINGE
INTRAMUSCULAR | Status: DC | PRN
  Administered 2023-03-20: 80 mg via INTRAVENOUS

## 2023-03-20 MED ORDER — EPHEDRINE 5 MG/ML INJ
INTRAVENOUS | Status: AC
  Filled 2023-03-20: qty 5

## 2023-03-20 MED ORDER — SODIUM CHLORIDE 0.9 % IR SOLN
Status: DC | PRN
  Administered 2023-03-20: 1000 mL via INTRAVESICAL

## 2023-03-20 MED ORDER — DEXMEDETOMIDINE HCL IN NACL 80 MCG/20ML IV SOLN
INTRAVENOUS | Status: AC
  Filled 2023-03-20: qty 20

## 2023-03-20 MED ORDER — LACTATED RINGERS IV SOLN
INTRAVENOUS | Status: DC
  Filled 2023-03-20 (×2): qty 250

## 2023-03-20 MED ORDER — PROPOFOL 10 MG/ML IV BOLUS
INTRAVENOUS | Status: DC | PRN
  Administered 2023-03-20: 160 mg via INTRAVENOUS

## 2023-03-20 MED ORDER — LIDOCAINE HCL (PF) 2 % IJ SOLN
INTRAMUSCULAR | Status: AC
  Filled 2023-03-20: qty 5

## 2023-03-20 MED ORDER — DEXAMETHASONE SODIUM PHOSPHATE 10 MG/ML IJ SOLN
INTRAMUSCULAR | Status: AC
  Filled 2023-03-20: qty 1

## 2023-03-20 MED ORDER — FENTANYL CITRATE (PF) 100 MCG/2ML IJ SOLN
INTRAMUSCULAR | Status: AC
  Filled 2023-03-20: qty 2

## 2023-03-20 MED ORDER — LACTATED RINGERS IV SOLN: INTRAVENOUS | Status: DC

## 2023-03-20 MED ORDER — ACETAMINOPHEN 500 MG PO TABS
1000.0000 mg | ORAL_TABLET | Freq: Once | ORAL | Status: AC
  Administered 2023-03-20: 1000 mg via ORAL

## 2023-03-20 MED ORDER — ACETAMINOPHEN 500 MG PO TABS
ORAL_TABLET | ORAL | Status: AC
  Filled 2023-03-20: qty 2

## 2023-03-20 MED ORDER — ONDANSETRON HCL 4 MG/2ML IJ SOLN
INTRAMUSCULAR | Status: DC | PRN
  Administered 2023-03-20: 4 mg via INTRAVENOUS

## 2023-03-20 MED ORDER — OXYCODONE HCL 5 MG PO TABS
5.0000 mg | ORAL_TABLET | Freq: Once | ORAL | Status: AC
  Administered 2023-03-20: 5 mg via ORAL
  Filled 2023-03-20: qty 1

## 2023-03-20 MED ORDER — DEXAMETHASONE SODIUM PHOSPHATE 10 MG/ML IJ SOLN
INTRAMUSCULAR | Status: DC | PRN
  Administered 2023-03-20: 10 mg via INTRAVENOUS

## 2023-03-20 MED ORDER — FENTANYL CITRATE (PF) 100 MCG/2ML IJ SOLN
INTRAMUSCULAR | Status: DC | PRN
  Administered 2023-03-20: 50 ug via INTRAVENOUS
  Administered 2023-03-20 (×2): 25 ug via INTRAVENOUS

## 2023-03-20 MED ORDER — FENTANYL CITRATE (PF) 100 MCG/2ML IJ SOLN: 25.0000 ug | INTRAMUSCULAR | Status: DC | PRN

## 2023-03-20 MED ORDER — ONDANSETRON HCL 4 MG/2ML IJ SOLN
INTRAMUSCULAR | Status: AC
  Filled 2023-03-20: qty 2

## 2023-03-20 MED ORDER — PROPOFOL 10 MG/ML IV BOLUS
INTRAVENOUS | Status: AC
  Filled 2023-03-20: qty 20

## 2023-03-20 MED ORDER — 0.9 % SODIUM CHLORIDE (POUR BTL) OPTIME
TOPICAL | Status: DC | PRN
  Administered 2023-03-20: 500 mL

## 2023-03-20 MED ORDER — EPHEDRINE SULFATE-NACL 50-0.9 MG/10ML-% IV SOSY
PREFILLED_SYRINGE | INTRAVENOUS | Status: DC | PRN
  Administered 2023-03-20 (×2): 5 mg via INTRAVENOUS

## 2023-03-20 SURGICAL SUPPLY — 20 items
BNDG CMPR 75X21 PLY HI ABS (MISCELLANEOUS)
DILATOR CANAL MILEX (MISCELLANEOUS) IMPLANT
DRSG TELFA 3X8 NADH STRL (GAUZE/BANDAGES/DRESSINGS) ×1 IMPLANT
GAUZE 4X4 16PLY ~~LOC~~+RFID DBL (SPONGE) ×2 IMPLANT
GAUZE PAD ABD 8X10 STRL (GAUZE/BANDAGES/DRESSINGS) ×1 IMPLANT
GAUZE STRETCH 2X75IN STRL (MISCELLANEOUS) IMPLANT
GLOVE BIO SURGEON STRL SZ7.5 (GLOVE) ×2 IMPLANT
GOWN STRL REUS W/TWL LRG LVL3 (GOWN DISPOSABLE) ×1 IMPLANT
HOLDER FOLEY CATH W/STRAP (MISCELLANEOUS) ×1 IMPLANT
IV NS 1000ML (IV SOLUTION) ×1
IV NS 1000ML BAXH (IV SOLUTION) ×1 IMPLANT
IV SET ADMIN PUMP GEMINI W/NLD (IV SETS) ×1 IMPLANT
KIT TURNOVER CYSTO (KITS) ×1 IMPLANT
MAT PREVALON FULL STRYKER (MISCELLANEOUS) ×1 IMPLANT
PACK VAGINAL MINOR WOMEN LF (CUSTOM PROCEDURE TRAY) ×1 IMPLANT
PACKING VAGINAL (PACKING) IMPLANT
SLEEVE SCD COMPRESS KNEE MED (STOCKING) ×1 IMPLANT
TOWEL OR 17X24 6PK STRL BLUE (TOWEL DISPOSABLE) ×1 IMPLANT
TRAY FOLEY W/BAG SLVR 14FR LF (SET/KITS/TRAYS/PACK) ×1 IMPLANT
WATER STERILE IRR 500ML POUR (IV SOLUTION) ×1 IMPLANT

## 2023-03-20 NOTE — Patient Instructions (Signed)
IMMEDIATELY FOLLOWING SURGERY: Do not drive or operate machinery for the first twenty four hours after surgery. Do not make any important decisions for twenty four hours after surgery or while taking narcotic pain medications or sedatives. If you develop intractable nausea and vomiting or a severe headache please notify your doctor immediately.   FOLLOW-UP: You do not need to follow up with anesthesia unless specifically instructed to do so.   WOUND CARE INSTRUCTIONS (if applicable): Expect some mild vaginal bleeding, but if large amount of bleeding occurs please contact Dr. Roselind Messier at [phone] or the Radiation On-Call physician. Call for any fever greater than 101.0 degrees or increasing vaginal//abdominal pain or trouble urinating.   QUESTIONS?: Please feel free to call your physician or the hospital operator if you have any questions, and they will be happy to assist you. Resume all medications: as listed on your after visit summary. Your next appointment is:  Future Appointments  Date Time Provider Department Center  03/20/2023 11:00 AM Antony Blackbird, MD [facility] None  03/26/2023  8:00 AM [facility]-US 1 [facility]US [place]  03/26/2023 11:00 AM Antony Blackbird, MD [facility] None  03/26/2023  2:00 PM Antony Blackbird, MD [facility] None  04/02/2023  8:00 AM WL-US 1 WL-US [place]  04/02/2023 11:00 AM Antony Blackbird, MD [facility] None  04/02/2023  2:00 PM Antony Blackbird, MD [facility] None  04/04/2023 11:05 AM [facility]  04/24/2023  3:00 PM [name], MD [facility]  07/22/2023  1:45 PM [facility]NUAL WELLNESS VISIT [facility]  01/19/2024 10:30 AM Nita Sickle K, DO [facility] None

## 2023-03-20 NOTE — Interval H&P Note (Signed)
History and Physical Interval Note:  03/20/2023 7:32 AM  [name]  has presented today for surgery, with the diagnosis of CERVICAL CANCER.  The various methods of treatment have been discussed with the patient and family. After consideration of risks, benefits and other options for treatment, the patient has consented to  Procedure(s): TANDEM RING INSERTION (N/A) OPERATIVE ULTRASOUND (N/A) as a surgical intervention.  The patient's history has been reviewed, patient examined, no change in status, stable for surgery.  I have reviewed the patient's chart and labs.  Questions were answered to the patient's satisfaction.     Antony Blackbird

## 2023-03-20 NOTE — Anesthesia Postprocedure Evaluation (Signed)
Anesthesia Post Note  Patient: [name]  Procedure(s) Performed: TANDEM RING INSERTION OPERATIVE ULTRASOUND     Patient location during evaluation: PACU Anesthesia Type: General Level of consciousness: awake and alert Pain management: pain level controlled Vital Signs Assessment: post-procedure vital signs reviewed and stable Respiratory status: spontaneous breathing, nonlabored ventilation and respiratory function stable Cardiovascular status: blood pressure returned to baseline and stable Postop Assessment: no apparent nausea or vomiting Anesthetic complications: no  No notable events documented.  Last Vitals:  Vitals:   03/20/23 0830 03/20/23 0845  BP: 134/65 (!) 143/65  Pulse: 83 69  Resp: 13 11  Temp:  (!) 36.1 C  SpO2: 90% 94%    Last Pain:  Vitals:   03/20/23 0845  TempSrc:   PainSc: 2                  Charrisse Masley,W. EDMOND

## 2023-03-20 NOTE — Op Note (Signed)
03/20/2023  8:34 AM  PATIENT:  [name]  72 y.o. female  PRE-OPERATIVE DIAGNOSIS:  CERVICAL CANCER  POST-OPERATIVE DIAGNOSIS:  CERVICAL CANCER  PROCEDURE:  Procedure(s): TANDEM RING INSERTION (N/A) OPERATIVE ULTRASOUND (N/A)  SURGEON:  Surgeons and Role:    Antony Blackbird, MD - Primary  PHYSICIAN ASSISTANT:   ASSISTANTS: none   ANESTHESIA:   general  EBL:  2 mL  BLOOD ADMINISTERED:none  DRAINS: Urinary Catheter (Foley)   LOCAL MEDICATIONS USED:  NONE  SPECIMEN:  No Specimen  DISPOSITION OF SPECIMEN:  N/A  COUNTS:  YES  TOURNIQUET:  * No tourniquets in log *  DICTATION:Patient was taken to [facility] room #1.  Patient was prepped and draped in the usual sterile fashion and placed in the dorsolithotomy position.  Timeout for the procedure, preoperative medications, antibiotics and allergies was performed.  Exam under anesthesia showed significant shrinkage of the cervical mass estimated to be approximately 3 to 4 cm in size.  No obvious parametrial extension bimanual and rectovaginal examination.  The cervical os was somewhat flush with the upper vaginal vault.  Some radiation changes noted along the cervical area.  The bladder was backfilled with approximately 250 cc of sterile saline for ultrasound imaging purposes.  on ultrasound the uterus was moderately anteverted.  The patient proceeded to undergo dilation and sounding.  The ultrasound images were much better this week.  The sound equipment could be identified in the midportion of the uterus and sounding proceeded to the fundus.  The uterus was estimated to be approximately 7 cm in length.  The cervical os was dilated.  The patient then had a 60 mm 45 degree tandem with the cervical sleeve placed within the endocervical and endometrial cavity.  Ultrasound imaging confirmed good placement.  The cervix was estimated to be approximately 3.5 x 2.5 cm on ultrasound.  The patient then had placement of a 45  degree cervical ring attached to the tandem.  A large shielding cap was placed along the cervical ring.  A rectal paddle was then attached to the tandem ring apparatus.  Ultrasound at the conclusion of the procedure confirmed good placement of the tandem ring apparatus.  the patient tolerated the procedure well.  She was subsequently transferred to the recovery room in stable condition.  Later in the day the patient will be transferred to radiation oncology for planning and her third high-dose-rate treatment.  She is receiving 5.5 Wallace Cullens to the high risk clinical target volume.  Plan is for the patient to receive 5 high-dose-rate brachytherapy treatments to complete her definitive course of radiation therapy.   PLAN OF CARE:  Transfer to radiation oncology for planning and treatment  PATIENT DISPOSITION:  PACU - hemodynamically stable.   Delay start of Pharmacological VTE agent (>24hrs) due to surgical blood loss or risk of bleeding: not applicable

## 2023-03-20 NOTE — Anesthesia Procedure Notes (Signed)
Procedure Name: LMA Insertion Date/Time: 03/20/2023 7:44 AM  Performed by: Izabell Schalk D, CRNAPre-anesthesia Checklist: Patient identified, Emergency Drugs available, Suction available and Patient being monitored Patient Re-evaluated:Patient Re-evaluated prior to induction Oxygen Delivery Method: Circle system utilized Preoxygenation: Pre-oxygenation with 100% oxygen Induction Type: IV induction Ventilation: Mask ventilation without difficulty LMA: LMA inserted LMA Size: 4.0 Tube type: Oral Number of attempts: 1 Placement Confirmation: positive ETCO2 and breath sounds checked- equal and bilateral Tube secured with: Tape Dental Injury: Teeth and Oropharynx as per pre-operative assessment

## 2023-03-20 NOTE — Transfer of Care (Signed)
Immediate Anesthesia Transfer of Care Note  Patient: [name]  Procedure(s) Performed: TANDEM RING INSERTION OPERATIVE ULTRASOUND  Patient Location: PACU  Anesthesia Type:General  Level of Consciousness: awake, alert , and oriented  Airway & Oxygen Therapy: Patient Spontanous Breathing and Patient connected to nasal cannula oxygen  Post-op Assessment: Report given to RN and Post -op Vital signs reviewed and stable  Post vital signs: Reviewed and stable  Last Vitals:  Vitals Value Taken Time  BP    Temp    Pulse 72 03/20/23 0814  Resp 13 03/20/23 0814  SpO2 99 % 03/20/23 0814  Vitals shown include unfiled device data.  Last Pain:  Vitals:   03/20/23 0602  TempSrc: Oral  PainSc: 3       Patients Stated Pain Goal: 4 (03/20/23 0602)  Complications: No notable events documented.

## 2023-03-21 ENCOUNTER — Encounter (HOSPITAL_BASED_OUTPATIENT_CLINIC_OR_DEPARTMENT_OTHER): Payer: Self-pay | Admitting: Radiation Oncology

## 2023-03-21 ENCOUNTER — Encounter (HOSPITAL_COMMUNITY): Payer: Self-pay

## 2023-03-21 ENCOUNTER — Other Ambulatory Visit: Payer: Self-pay

## 2023-03-21 NOTE — Progress Notes (Signed)
Spoke w/ via phone for pre-op interview---pt Lab needs dos----none  req orders for surgery with shirley             Lab results------cbc, bmp 03-05-1023 epic, EKG 01-19-2023 epic, echo tee 09-30-2012 epic COVID test -----patient states asymptomatic no test needed Arrive at -------630 am 03-26-2023 NPO after MN NO Solid Food.  Clear liquids from MN until---530 am Med rec completed Medications to take morning of surgery -----gabapentin, metoprolol succinate Diabetic medication -----n/a Patient instructed no nail polish to be worn day of surgery Patient instructed to bring photo id and insurance card day of surgery Patient aware to have Driver (ride ) / caregiver   husband [name]  for 24 hours after surgery  Patient Special Instructions -----none Pre-Op special Instructions -----none Patient verbalized understanding of instructions that were given at this phone interview. Patient denies shortness of breath, chest pain, fever, cough at this phone interview.

## 2023-03-25 NOTE — Progress Notes (Signed)
  Radiation Oncology         [phone] ________________________________  Name: [name] MRN: [id number]  Date: 03/26/2023  DOB: 07-09-1951  CC: [name], MD  [name], MD  HDR BRACHYTHERAPY NOTE  DIAGNOSIS: The encounter diagnosis was Malignant neoplasm of cervix, unspecified site [facility]) [C53.9].    Cancer Staging  Cervical cancer [facility]) Staging form: Cervix Uteri, AJCC Version 9 - Clinical stage from 12/24/2022: FIGO Stage IIIC2 (cT2b, cN2a, cM0) - Signed by Artis Delay, MD on 12/31/2022  NARRATIVE: The patient was brought to the HDR suite. Identity was confirmed. All relevant records and images related to the planned course of therapy were reviewed. The patient freely provided informed written consent to proceed with treatment after reviewing the details related to the planned course of therapy. The consent form was witnessed and verified by the simulation staff. Then, the patient was set-up in a stable reproducible supine position for radiation therapy. The tandem ring system was accessed and fiducial markers were placed within the tandem and ring.   Simple treatment device note: On the operating room table the patient had construction of her custom tandem ring system. She will be treated with a 45 tandem/ring system. The patient had placement of a 60 mm tandem. A cervical ring with a large shielding was used for her treatment. A rectal paddle was also part of her custom set up device.  Verification simulation note: An AP and lateral film was obtained through the pelvis area. This was compared to the patient's planning films documenting accurate position of the tandem/ring system for treatment.  High-dose-rate brachytherapy treatment note:   The remote afterloading device was accessed through catheter system and attached to the tandem ring system. Patient then proceeded to undergo her fourth high-dose-rate treatment directed at the cervix. The patient was  prescribed a dose of 5.5 gray to be delivered to the high risk clinical target volume.. Patient was treated with 2 channels using 28 dwell positions. Treatment time was 435.4 seconds. The patient tolerated the procedure well. After completion of her therapy, a radiation survey was performed documenting return of the iridium source into the GammaMed safe. The patient was then transferred to the nursing suite.  She then had removal of the rectal paddle followed by the tandem and ring system. The patient tolerated the removal well.  She did have a lot of pain in the lower back  in part related to her prior history of back issues and fusion.  She did need oral oxycodone 5 mg  to help control this pain after she was transported from the outpatient OR to begin the planning process and her third high-dose-rate treatment.   PLAN: The patient will return next week for her fifth high-dose-rate treatment.  ________________________________   -----------------------------------  Billie Lade, PhD, MD  This document serves as a record of services personally performed by Antony Blackbird, MD. It was created on his behalf by Neena Rhymes, a trained medical scribe. The creation of this record is based on the scribe's personal observations and the provider's statements to them. This document has been checked and approved by the attending provider.

## 2023-03-25 NOTE — Anesthesia Preprocedure Evaluation (Signed)
Anesthesia Evaluation  Patient identified by MRN, date of birth, ID band Patient awake    Reviewed: Allergy & Precautions, NPO status , Patient's Chart, lab work & pertinent test results, reviewed documented beta blocker date and time   History of Anesthesia Complications (+) PONV and history of anesthetic complications  Airway Mallampati: II  TM Distance: >3 FB Neck ROM: Full    Dental  (+) Partial Lower, Partial Upper, Missing   Pulmonary former smoker   Pulmonary exam normal        Cardiovascular hypertension, Pt. on medications and Pt. on home beta blockers + Peripheral Vascular Disease  Normal cardiovascular exam+ dysrhythmias Atrial Fibrillation      Neuro/Psych negative neurological ROS     GI/Hepatic Neg liver ROS,GERD  ,,  Endo/Other    Morbid obesity  Renal/GU negative Renal ROS     Musculoskeletal  (+) Arthritis , Rheumatoid disorders,    Abdominal   Peds  Hematology negative hematology ROS (+)   Anesthesia Other Findings CERVICAL CANCER  Reproductive/Obstetrics                              Anesthesia Physical Anesthesia Plan  ASA: 3  Anesthesia Plan: General   Post-op Pain Management: Tylenol PO (pre-op)*   Induction: Intravenous  PONV Risk Score and Plan: 4 or greater and Treatment may vary due to age or medical condition, Ondansetron, Dexamethasone and Propofol infusion  Airway Management Planned: LMA  Additional Equipment: None  Intra-op Plan:   Post-operative Plan: Extubation in OR  Informed Consent: I have reviewed the patients History and Physical, chart, labs and discussed the procedure including the risks, benefits and alternatives for the proposed anesthesia with the patient or authorized representative who has indicated his/her understanding and acceptance.     Dental advisory given  Plan Discussed with: CRNA  Anesthesia Plan Comments:          Anesthesia Quick Evaluation

## 2023-03-26 ENCOUNTER — Ambulatory Visit (HOSPITAL_BASED_OUTPATIENT_CLINIC_OR_DEPARTMENT_OTHER): Payer: Medicare Other | Admitting: Anesthesiology

## 2023-03-26 ENCOUNTER — Encounter (HOSPITAL_BASED_OUTPATIENT_CLINIC_OR_DEPARTMENT_OTHER): Payer: Self-pay | Admitting: Radiation Oncology

## 2023-03-26 ENCOUNTER — Other Ambulatory Visit: Payer: Self-pay

## 2023-03-26 ENCOUNTER — Ambulatory Visit
Admission: RE | Admit: 2023-03-26 | Discharge: 2023-03-26 | Disposition: A | Discharge status: Home / Self Care | Payer: Medicare Other | Source: Ambulatory Visit | Attending: Radiation Oncology | Admitting: Radiation Oncology

## 2023-03-26 ENCOUNTER — Ambulatory Visit (HOSPITAL_BASED_OUTPATIENT_CLINIC_OR_DEPARTMENT_OTHER)
Admission: RE | Admit: 2023-03-26 | Discharge: 2023-03-26 | Disposition: A | Discharge status: Other Acute Inpatient Hospital | Payer: Medicare Other | Attending: Radiation Oncology | Admitting: Radiation Oncology

## 2023-03-26 ENCOUNTER — Ambulatory Visit (HOSPITAL_COMMUNITY)
Admission: RE | Admit: 2023-03-26 | Discharge: 2023-03-26 | Disposition: A | Discharge status: Home / Self Care | Payer: Medicare Other | Source: Ambulatory Visit | Attending: Radiation Oncology | Admitting: Radiation Oncology

## 2023-03-26 ENCOUNTER — Encounter (HOSPITAL_BASED_OUTPATIENT_CLINIC_OR_DEPARTMENT_OTHER)
Admission: RE | Disposition: A | Discharge status: Other Acute Inpatient Hospital | Payer: Self-pay | Source: Home / Self Care | Attending: Radiation Oncology

## 2023-03-26 VITALS — BP 125/63 | HR 67 | Resp 18

## 2023-03-26 DIAGNOSIS — M069 Rheumatoid arthritis, unspecified: Secondary | ICD-10-CM | POA: Insufficient documentation

## 2023-03-26 DIAGNOSIS — C539 Malignant neoplasm of cervix uteri, unspecified: Secondary | ICD-10-CM

## 2023-03-26 DIAGNOSIS — Z87891 Personal history of nicotine dependence: Secondary | ICD-10-CM | POA: Insufficient documentation

## 2023-03-26 DIAGNOSIS — G629 Polyneuropathy, unspecified: Secondary | ICD-10-CM | POA: Diagnosis not present

## 2023-03-26 DIAGNOSIS — I4819 Other persistent atrial fibrillation: Secondary | ICD-10-CM | POA: Diagnosis not present

## 2023-03-26 DIAGNOSIS — Z9221 Personal history of antineoplastic chemotherapy: Secondary | ICD-10-CM | POA: Insufficient documentation

## 2023-03-26 DIAGNOSIS — E785 Hyperlipidemia, unspecified: Secondary | ICD-10-CM | POA: Diagnosis not present

## 2023-03-26 DIAGNOSIS — Z7901 Long term (current) use of anticoagulants: Secondary | ICD-10-CM | POA: Insufficient documentation

## 2023-03-26 DIAGNOSIS — C538 Malignant neoplasm of overlapping sites of cervix uteri: Secondary | ICD-10-CM

## 2023-03-26 DIAGNOSIS — K219 Gastro-esophageal reflux disease without esophagitis: Secondary | ICD-10-CM | POA: Insufficient documentation

## 2023-03-26 DIAGNOSIS — I10 Essential (primary) hypertension: Secondary | ICD-10-CM | POA: Insufficient documentation

## 2023-03-26 DIAGNOSIS — I4891 Unspecified atrial fibrillation: Secondary | ICD-10-CM

## 2023-03-26 DIAGNOSIS — Z01818 Encounter for other preprocedural examination: Secondary | ICD-10-CM

## 2023-03-26 HISTORY — PX: TANDEM RING INSERTION: SHX6199

## 2023-03-26 HISTORY — PX: OPERATIVE ULTRASOUND: SHX5996

## 2023-03-26 LAB — RAD ONC ARIA SESSION SUMMARY
Course Elapsed Days: 71
Plan Fractions Treated to Date: 1
Plan Prescribed Dose Per Fraction: 5.5 Gy
Plan Total Fractions Prescribed: 1
Plan Total Prescribed Dose: 5.5 Gy
Reference Point Dosage Given to Date: 19.0573 Gy
Reference Point Dosage Given to Date: 20.887 Gy
Reference Point Dosage Given to Date: 22 Gy
Reference Point Dosage Given to Date: 39.3969 Gy
Reference Point Dosage Given to Date: 45.4193 Gy
Reference Point Session Dosage Given: 10.3417 Gy
Reference Point Session Dosage Given: 11.6826 Gy
Reference Point Session Dosage Given: 4.6728 Gy
Reference Point Session Dosage Given: 5.117 Gy
Reference Point Session Dosage Given: 5.5 Gy
Session Number: 34

## 2023-03-26 SURGERY — INSERTION, UTERINE TANDEM AND RING OR CYLINDER, FOR BRACHYTHERAPY
Anesthesia: General | Site: Cervix

## 2023-03-26 MED ORDER — LACTATED RINGERS IV SOLN
INTRAVENOUS | Status: DC
  Filled 2023-03-26 (×2): qty 250

## 2023-03-26 MED ORDER — PROPOFOL 1000 MG/100ML IV EMUL
INTRAVENOUS | Status: AC
  Filled 2023-03-26: qty 100

## 2023-03-26 MED ORDER — PHENYLEPHRINE HCL (PRESSORS) 10 MG/ML IV SOLN
INTRAVENOUS | Status: DC | PRN
  Administered 2023-03-26 (×3): 80 ug via INTRAVENOUS

## 2023-03-26 MED ORDER — DEXAMETHASONE SODIUM PHOSPHATE 10 MG/ML IJ SOLN
INTRAMUSCULAR | Status: AC
  Filled 2023-03-26: qty 1

## 2023-03-26 MED ORDER — LIDOCAINE HCL (CARDIAC) PF 100 MG/5ML IV SOSY
PREFILLED_SYRINGE | INTRAVENOUS | Status: DC | PRN
  Administered 2023-03-26: 100 mg via INTRAVENOUS

## 2023-03-26 MED ORDER — PROPOFOL 10 MG/ML IV BOLUS
INTRAVENOUS | Status: AC
  Filled 2023-03-26: qty 20

## 2023-03-26 MED ORDER — SODIUM CHLORIDE 0.9 % IR SOLN
Status: DC | PRN
  Administered 2023-03-26: 300 mL

## 2023-03-26 MED ORDER — PROPOFOL 10 MG/ML IV BOLUS
INTRAVENOUS | Status: DC | PRN
Start: 2023-03-26 — End: 2023-03-26
  Administered 2023-03-26: 200 mg via INTRAVENOUS

## 2023-03-26 MED ORDER — FENTANYL CITRATE (PF) 100 MCG/2ML IJ SOLN
INTRAMUSCULAR | Status: DC | PRN
  Administered 2023-03-26 (×2): 50 ug via INTRAVENOUS

## 2023-03-26 MED ORDER — LACTATED RINGERS IV SOLN: INTRAVENOUS | Status: DC

## 2023-03-26 MED ORDER — FENTANYL CITRATE (PF) 100 MCG/2ML IJ SOLN
INTRAMUSCULAR | Status: AC
  Filled 2023-03-26: qty 2

## 2023-03-26 MED ORDER — ONDANSETRON HCL 4 MG/2ML IJ SOLN
INTRAMUSCULAR | Status: AC
  Filled 2023-03-26: qty 2

## 2023-03-26 MED ORDER — DEXAMETHASONE SODIUM PHOSPHATE 10 MG/ML IJ SOLN
INTRAMUSCULAR | Status: DC | PRN
  Administered 2023-03-26: 5 mg via INTRAVENOUS

## 2023-03-26 MED ORDER — OXYCODONE HCL 5 MG PO TABS
5.0000 mg | ORAL_TABLET | Freq: Once | ORAL | Status: AC
  Administered 2023-03-26: 5 mg via ORAL
  Filled 2023-03-26: qty 1

## 2023-03-26 MED ORDER — ACETAMINOPHEN 500 MG PO TABS
1000.0000 mg | ORAL_TABLET | Freq: Once | ORAL | Status: AC
  Administered 2023-03-26: 1000 mg via ORAL

## 2023-03-26 MED ORDER — ONDANSETRON HCL 4 MG/2ML IJ SOLN
INTRAMUSCULAR | Status: DC | PRN
  Administered 2023-03-26: 4 mg via INTRAVENOUS

## 2023-03-26 MED ORDER — FENTANYL CITRATE (PF) 100 MCG/2ML IJ SOLN
25.0000 ug | INTRAMUSCULAR | Status: DC | PRN
  Administered 2023-03-26 (×2): 25 ug via INTRAVENOUS

## 2023-03-26 MED ORDER — LIDOCAINE HCL (PF) 2 % IJ SOLN
INTRAMUSCULAR | Status: AC
  Filled 2023-03-26: qty 5

## 2023-03-26 MED ORDER — AMISULPRIDE (ANTIEMETIC) 5 MG/2ML IV SOLN: 10.0000 mg | Freq: Once | INTRAVENOUS | Status: DC | PRN

## 2023-03-26 MED ORDER — PROPOFOL 500 MG/50ML IV EMUL
INTRAVENOUS | Status: DC | PRN
Start: 2023-03-26 — End: 2023-03-26
  Administered 2023-03-26: 200 ug/kg/min via INTRAVENOUS
  Administered 2023-03-26: 150 ug/kg/min via INTRAVENOUS

## 2023-03-26 MED ORDER — ACETAMINOPHEN 500 MG PO TABS
ORAL_TABLET | ORAL | Status: AC
  Filled 2023-03-26: qty 2

## 2023-03-26 MED ORDER — OXYCODONE HCL 5 MG PO TABS: 5.0000 mg | ORAL_TABLET | Freq: Once | ORAL | Status: DC | PRN

## 2023-03-26 MED ORDER — PHENYLEPHRINE 80 MCG/ML (10ML) SYRINGE FOR IV PUSH (FOR BLOOD PRESSURE SUPPORT)
PREFILLED_SYRINGE | INTRAVENOUS | Status: AC
  Filled 2023-03-26: qty 10

## 2023-03-26 MED ORDER — GLYCOPYRROLATE 0.2 MG/ML IJ SOLN
INTRAMUSCULAR | Status: DC | PRN
  Administered 2023-03-26: .2 mg via INTRAVENOUS

## 2023-03-26 MED ORDER — OXYCODONE HCL 5 MG/5ML PO SOLN: 5.0000 mg | Freq: Once | ORAL | Status: DC | PRN

## 2023-03-26 MED ORDER — 0.9 % SODIUM CHLORIDE (POUR BTL) OPTIME
TOPICAL | Status: DC | PRN
  Administered 2023-03-26: 500 mL

## 2023-03-26 SURGICAL SUPPLY — 19 items
GAUZE PAD ABD 8X10 STRL (GAUZE/BANDAGES/DRESSINGS) ×2 IMPLANT
GLOVE BIO SURGEON STRL SZ 6 (GLOVE) IMPLANT
GLOVE BIO SURGEON STRL SZ7.5 (GLOVE) ×4 IMPLANT
GLOVE BIOGEL PI IND STRL 6 (GLOVE) IMPLANT
GLOVE BIOGEL PI IND STRL 7.0 (GLOVE) IMPLANT
GOWN STRL REUS W/TWL LRG LVL3 (GOWN DISPOSABLE) ×2 IMPLANT
HOLDER FOLEY CATH W/STRAP (MISCELLANEOUS) ×2 IMPLANT
IV NS 1000ML (IV SOLUTION) ×2
IV NS 1000ML BAXH (IV SOLUTION) ×2 IMPLANT
IV SET ADMIN PUMP GEMINI W/NLD (IV SETS) ×2 IMPLANT
KIT TURNOVER CYSTO (KITS) ×2 IMPLANT
MAT PREVALON FULL STRYKER (MISCELLANEOUS) ×2 IMPLANT
NS IRRIG 500ML POUR BTL (IV SOLUTION) IMPLANT
PACK VAGINAL MINOR WOMEN LF (CUSTOM PROCEDURE TRAY) ×2 IMPLANT
SLEEVE SCD COMPRESS KNEE MED (STOCKING) ×2 IMPLANT
SOL PREP POV-IOD 4OZ 10% (MISCELLANEOUS) IMPLANT
TOWEL OR 17X24 6PK STRL BLUE (TOWEL DISPOSABLE) ×2 IMPLANT
TRAY FOLEY W/BAG SLVR 14FR LF (SET/KITS/TRAYS/PACK) ×2 IMPLANT
WATER STERILE IRR 500ML POUR (IV SOLUTION) ×2 IMPLANT

## 2023-03-26 NOTE — Transfer of Care (Signed)
Immediate Anesthesia Transfer of Care Note  Patient: [name]  Procedure(s) Performed: Procedure(s) (LRB): TANDEM RING INSERTION (N/A) OPERATIVE ULTRASOUND (N/A)  Patient Location: PACU  Anesthesia Type: General  Level of Consciousness: awake, sedated, patient cooperative and responds to stimulation  Airway & Oxygen Therapy: Patient Spontanous Breathing and Patient connected to [place] oxygen  Post-op Assessment: Report given to PACU RN, Post -op Vital signs reviewed and stable and Patient moving all extremities  Post vital signs: Reviewed and stable  Complications: No apparent anesthesia complications

## 2023-03-26 NOTE — Patient Instructions (Signed)
IMMEDIATELY FOLLOWING SURGERY: Do not drive or operate machinery for the first twenty four hours after surgery. Do not make any important decisions for twenty four hours after surgery or while taking narcotic pain medications or sedatives. If you develop intractable nausea and vomiting or a severe headache please notify your doctor immediately.   FOLLOW-UP: You do not need to follow up with anesthesia unless specifically instructed to do so.   WOUND CARE INSTRUCTIONS (if applicable): Expect some mild vaginal bleeding, but if large amount of bleeding occurs please contact Dr. Roselind Messier at [phone] or the Radiation On-Call physician. Call for any fever greater than 101.0 degrees or increasing vaginal//abdominal pain or trouble urinating.   QUESTIONS?: Please feel free to call your physician or the hospital operator if you have any questions, and they will be happy to assist you. Resume all medications: as listed on your after visit summary. Your next appointment is:  Future Appointments  Date Time Provider Department Center  03/26/2023  2:00 PM Antony Blackbird, MD [facility] None  04/02/2023  8:00 AM WL-US 1 WL-US [place]  04/02/2023 11:00 AM Antony Blackbird, MD [facility] None  04/02/2023  2:00 PM Antony Blackbird, MD [facility] None  04/04/2023 11:05 AM [facility]  04/24/2023  3:00 PM [name], MD [facility]  07/22/2023  1:45 PM [facility]ANNUAL WELLNESS VISIT [facility]  01/19/2024 10:30 AM Nita Sickle K, DO [facility] None

## 2023-03-26 NOTE — Op Note (Signed)
03/26/2023  9:34 AM  PATIENT:  [name]  72 y.o. female  PRE-OPERATIVE DIAGNOSIS:  CERVICAL CANCER  POST-OPERATIVE DIAGNOSIS:  CERVICAL CANCER  PROCEDURE:  Procedure(s): TANDEM RING INSERTION (N/A) OPERATIVE ULTRASOUND (N/A)  SURGEON:  Surgeons and Role:    Antony Blackbird, MD - Primary  PHYSICIAN ASSISTANT:   ASSISTANTS: none   ANESTHESIA:   general  EBL:  2 mL  BLOOD ADMINISTERED:none  DRAINS: Urinary Catheter (Foley)   LOCAL MEDICATIONS USED:  NONE  SPECIMEN:  No Specimen  DISPOSITION OF SPECIMEN:  N/A  COUNTS:  YES  TOURNIQUET:  * No tourniquets in log *  DICTATION: Patient was taken to [facility] room #1.  Patient was prepped and draped in the usual sterile fashion and placed in the dorsolithotomy position.  Timeout for the procedure, preoperative medications, antibiotics and allergies was performed. With the assistance of the ultrasonographer the patient underwent transvaginal ultrasound of the cervix.  Size measurements are recorded in the ultrasound report with overall significant shrinkage of the cervical mass compared to her pretreatment tumor size.  The cervical os was somewhat flush with the upper vaginal vault.  Some radiation changes noted along the cervical os area.  A Foley catheter was placed without difficulty.  The bladder was backfilled with approximately 200 cc of sterile saline for ultrasound imaging purposes.  on ultrasound the uterus was moderately anteverted.  The patient proceeded to undergo dilation and sounding.  The ultrasound images were much better this week.  The sound equipment could be identified in the midportion of the uterus and sounding proceeded to the fundus.  The uterus was estimated to be approximately 7 cm in length.  The cervical os was dilated.  The patient then had a 60 mm 45 degree tandem with the cervical sleeve placed within the endocervical and endometrial cavity.  Ultrasound imaging confirmed good  placement.  The cervix was estimated to be approximately 3.5 x 2.5 cm on transabdominal ultrasound.  The patient then had placement of a 45 degree cervical ring attached to the tandem.  A large shielding cap was placed along the cervical ring.  A rectal paddle was then attached to the tandem ring apparatus.  Ultrasound at the conclusion of the procedure confirmed good placement of the tandem ring apparatus.  the patient tolerated the procedure well.  She was subsequently transferred to the recovery room in stable condition.  Later in the day the patient will be transferred to radiation oncology for planning and her fourth high-dose-rate treatment.  She is receiving 5.5 Wallace Cullens to the high risk clinical target volume.  Plan is for the patient to receive 5 high-dose-rate brachytherapy treatments to complete her definitive course of radiation therapy.   PLAN OF CARE:  To radiation oncology for planning and treatment  PATIENT DISPOSITION:  PACU - hemodynamically stable.   Delay start of Pharmacological VTE agent (>24hrs) due to surgical blood loss or risk of bleeding: not applicable

## 2023-03-26 NOTE — Interval H&P Note (Signed)
History and Physical Interval Note:  03/26/2023 8:32 AM  [name]  has presented today for surgery, with the diagnosis of CERVICAL CANCER.  The various methods of treatment have been discussed with the patient and family. After consideration of risks, benefits and other options for treatment, the patient has consented to  Procedure(s): TANDEM RING INSERTION (N/A) OPERATIVE ULTRASOUND (N/A) as a surgical intervention.  The patient's history has been reviewed, patient examined, no change in status, stable for surgery.  I have reviewed the patient's chart and labs.  Questions were answered to the patient's satisfaction.     Antony Blackbird

## 2023-03-26 NOTE — Anesthesia Postprocedure Evaluation (Signed)
Anesthesia Post Note  Patient: [name]  Procedure(s) Performed: TANDEM RING INSERTION (Cervix) OPERATIVE ULTRASOUND (Abdomen)     Patient location during evaluation: PACU Anesthesia Type: General Level of consciousness: awake and alert Pain management: pain level controlled Vital Signs Assessment: post-procedure vital signs reviewed and stable Respiratory status: spontaneous breathing, nonlabored ventilation and respiratory function stable Cardiovascular status: blood pressure returned to baseline Postop Assessment: no apparent nausea or vomiting Anesthetic complications: no   No notable events documented.  Last Vitals:  Vitals:   03/26/23 0945 03/26/23 1000  BP: 104/63 133/70  Pulse: 92 91  Resp: 14 16  Temp:    SpO2: 91% 96%    Last Pain:  Vitals:   03/26/23 1000  TempSrc:   PainSc: 3                  Shanda Howells

## 2023-03-27 ENCOUNTER — Encounter (HOSPITAL_BASED_OUTPATIENT_CLINIC_OR_DEPARTMENT_OTHER): Payer: Self-pay | Admitting: Radiation Oncology

## 2023-03-28 ENCOUNTER — Encounter (HOSPITAL_BASED_OUTPATIENT_CLINIC_OR_DEPARTMENT_OTHER): Payer: Self-pay | Admitting: Radiation Oncology

## 2023-03-28 ENCOUNTER — Other Ambulatory Visit: Payer: Self-pay

## 2023-03-28 ENCOUNTER — Encounter (HOSPITAL_COMMUNITY): Payer: Self-pay

## 2023-03-28 NOTE — Progress Notes (Signed)
Spoke w/ via phone for pre-op interview---pt Lab needs dos----none  req orders for surgery with shirley             Lab results------cbc, bmp 03-05-1023 epic, EKG 01-19-2023 epic, echo tee 09-30-2012 epic COVID test -----patient states asymptomatic no test needed Arrive at -------630 am 04-02-2023 NPO after MN NO Solid Food.  Clear liquids from MN until---530 am Med rec completed Medications to take morning of surgery -----gabapentin, metoprolol succinate Diabetic medication -----n/a Patient instructed no nail polish to be worn day of surgery Patient instructed to bring photo id and insurance card day of surgery Patient aware to have Driver (ride ) / caregiver   husband danny  for 24 hours after surgery  Patient Special Instructions -----none Pre-Op special Instructions -----none Patient verbalized understanding of instructions that were given at this phone interview. Patient denies shortness of breath, chest pain, fever, cough at this phone interview.

## 2023-04-01 ENCOUNTER — Other Ambulatory Visit (HOSPITAL_COMMUNITY): Payer: Self-pay

## 2023-04-01 ENCOUNTER — Other Ambulatory Visit: Payer: Self-pay

## 2023-04-01 NOTE — Progress Notes (Signed)
  Radiation Oncology         [phone] ________________________________  Name: [name] MRN: [id number]  Date: 04/02/2023  DOB: 29-Aug-1950  CC: [name], MD  [name], MD  HDR BRACHYTHERAPY NOTE  DIAGNOSIS: The encounter diagnosis was Malignant neoplasm of cervix, unspecified site [facility]) [C53.9].    Cancer Staging  Cervical cancer [facility]) Staging form: Cervix Uteri, AJCC Version 9 - Clinical stage from 12/24/2022: FIGO Stage IIIC2 (cT2b, cN2a, cM0) - Signed by Artis Delay, MD on 12/31/2022  NARRATIVE: The patient was brought to the HDR suite. Identity was confirmed. All relevant records and images related to the planned course of therapy were reviewed. The patient freely provided informed written consent to proceed with treatment after reviewing the details related to the planned course of therapy. The consent form was witnessed and verified by the simulation staff. Then, the patient was set-up in a stable reproducible supine position for radiation therapy. The tandem ring system was accessed and fiducial markers were placed within the tandem and ring.   Simple treatment device note: On the operating room table the patient had construction of her custom tandem ring system. She will be treated with a 45 tandem/ring system. The patient had placement of a 60 mm tandem. A cervical ring with a large shielding was used for her treatment. A rectal paddle was also part of her custom set up device.  Verification simulation note: An AP and lateral film was obtained through the pelvis area. This was compared to the patient's planning films documenting accurate position of the tandem/ring system for treatment.  High-dose-rate brachytherapy treatment note:   The remote afterloading device was accessed through catheter system and attached to the tandem ring system. Patient then proceeded to undergo her fifth high-dose-rate treatment directed at the cervix. The patient was  prescribed a dose of 5.5 gray to be delivered to the high risk clinical target volume.. Patient was treated with 2 channels using 28 dwell positions. Treatment time was 461.4 seconds. The patient tolerated the procedure well. After completion of her therapy, a radiation survey was performed documenting return of the iridium source into the GammaMed safe. The patient was then transferred to the nursing suite.  She then had removal of the rectal paddle followed by the tandem and ring system. The patient tolerated the removal well.  She did have a lot of pain in the lower back  in part related to her prior history of back issues and fusion.  She did need oral oxycodone 5 mg and demerol  to help control this pain after she was transported from the outpatient OR to begin the planning process and her high-dose-rate treatment.   PLAN: The patient has completed her fifth and final high-dose-rate treatment. She tolerated treatment reasonably well overall without any significant side effects except back pain related to prior spinal fusion. She will return here for routine follow-up in one month.  ________________________________   -----------------------------------  Billie Lade, PhD, MD  This document serves as a record of services personally performed by Antony Blackbird, MD. It was created on his behalf by Neena Rhymes, a trained medical scribe. The creation of this record is based on the scribe's personal observations and the provider's statements to them. This document has been checked and approved by the attending provider.

## 2023-04-01 NOTE — Anesthesia Preprocedure Evaluation (Signed)
Anesthesia Evaluation  Patient identified by MRN, date of birth, ID band Patient awake    Reviewed: Allergy & Precautions, H&P , NPO status , Patient's Chart, lab work & pertinent test results  History of Anesthesia Complications (+) PONV and history of anesthetic complications  Airway Mallampati: II  TM Distance: >3 FB Neck ROM: Full    Dental no notable dental hx.    Pulmonary former smoker   Pulmonary exam normal breath sounds clear to auscultation       Cardiovascular hypertension, Normal cardiovascular exam Rhythm:Regular Rate:Normal     Neuro/Psych negative neurological ROS  negative psych ROS   GI/Hepatic Neg liver ROS,GERD  ,,  Endo/Other    Morbid obesity  Renal/GU negative Renal ROS  negative genitourinary   Musculoskeletal  (+) Arthritis , Rheumatoid disorders,    Abdominal   Peds negative pediatric ROS (+)  Hematology negative hematology ROS (+)   Anesthesia Other Findings   Reproductive/Obstetrics negative OB ROS                             Anesthesia Physical Anesthesia Plan  ASA: 3  Anesthesia Plan: General   Post-op Pain Management: Minimal or no pain anticipated   Induction: Intravenous  PONV Risk Score and Plan: 4 or greater and Ondansetron, Dexamethasone and Treatment may vary due to age or medical condition  Airway Management Planned: LMA  Additional Equipment:   Intra-op Plan:   Post-operative Plan: Extubation in OR  Informed Consent: I have reviewed the patients History and Physical, chart, labs and discussed the procedure including the risks, benefits and alternatives for the proposed anesthesia with the patient or authorized representative who has indicated his/her understanding and acceptance.     Dental advisory given  Plan Discussed with: CRNA and Surgeon  Anesthesia Plan Comments:        Anesthesia Quick Evaluation

## 2023-04-02 ENCOUNTER — Ambulatory Visit: Admission: RE | Admit: 2023-04-02 | Payer: Medicare Other | Source: Ambulatory Visit | Admitting: Radiation Oncology

## 2023-04-02 ENCOUNTER — Other Ambulatory Visit: Payer: Self-pay

## 2023-04-02 ENCOUNTER — Ambulatory Visit (HOSPITAL_BASED_OUTPATIENT_CLINIC_OR_DEPARTMENT_OTHER): Payer: Medicare Other | Admitting: Anesthesiology

## 2023-04-02 ENCOUNTER — Ambulatory Visit (HOSPITAL_COMMUNITY): Admission: RE | Admit: 2023-04-02 | Payer: Medicare Other | Source: Ambulatory Visit | Admitting: Radiation Oncology

## 2023-04-02 ENCOUNTER — Ambulatory Visit (HOSPITAL_BASED_OUTPATIENT_CLINIC_OR_DEPARTMENT_OTHER)
Admission: RE | Admit: 2023-04-02 | Discharge: 2023-04-02 | Disposition: A | Discharge status: Other Acute Inpatient Hospital | Payer: Medicare Other | Attending: Radiation Oncology | Admitting: Radiation Oncology

## 2023-04-02 ENCOUNTER — Encounter (HOSPITAL_BASED_OUTPATIENT_CLINIC_OR_DEPARTMENT_OTHER)
Admission: RE | Disposition: A | Discharge status: Other Acute Inpatient Hospital | Payer: Self-pay | Source: Home / Self Care | Attending: Radiation Oncology

## 2023-04-02 ENCOUNTER — Ambulatory Visit
Admission: RE | Admit: 2023-04-02 | Discharge: 2023-04-02 | Disposition: A | Discharge status: Home / Self Care | Payer: Medicare Other | Source: Ambulatory Visit | Attending: Radiation Oncology | Admitting: Radiation Oncology

## 2023-04-02 ENCOUNTER — Encounter (HOSPITAL_BASED_OUTPATIENT_CLINIC_OR_DEPARTMENT_OTHER): Payer: Self-pay | Admitting: Radiation Oncology

## 2023-04-02 VITALS — BP 131/57 | HR 72 | Resp 18

## 2023-04-02 DIAGNOSIS — I4891 Unspecified atrial fibrillation: Secondary | ICD-10-CM | POA: Diagnosis not present

## 2023-04-02 DIAGNOSIS — Z87891 Personal history of nicotine dependence: Secondary | ICD-10-CM

## 2023-04-02 DIAGNOSIS — C539 Malignant neoplasm of cervix uteri, unspecified: Secondary | ICD-10-CM

## 2023-04-02 DIAGNOSIS — C538 Malignant neoplasm of overlapping sites of cervix uteri: Secondary | ICD-10-CM

## 2023-04-02 DIAGNOSIS — M069 Rheumatoid arthritis, unspecified: Secondary | ICD-10-CM | POA: Diagnosis not present

## 2023-04-02 DIAGNOSIS — Z01818 Encounter for other preprocedural examination: Secondary | ICD-10-CM

## 2023-04-02 DIAGNOSIS — I10 Essential (primary) hypertension: Secondary | ICD-10-CM | POA: Insufficient documentation

## 2023-04-02 DIAGNOSIS — E785 Hyperlipidemia, unspecified: Secondary | ICD-10-CM | POA: Diagnosis not present

## 2023-04-02 DIAGNOSIS — Z6841 Body Mass Index (BMI) 40.0 and over, adult: Secondary | ICD-10-CM | POA: Diagnosis not present

## 2023-04-02 DIAGNOSIS — Z7902 Long term (current) use of antithrombotics/antiplatelets: Secondary | ICD-10-CM | POA: Diagnosis not present

## 2023-04-02 DIAGNOSIS — Z79899 Other long term (current) drug therapy: Secondary | ICD-10-CM | POA: Diagnosis not present

## 2023-04-02 DIAGNOSIS — K219 Gastro-esophageal reflux disease without esophagitis: Secondary | ICD-10-CM | POA: Diagnosis not present

## 2023-04-02 HISTORY — PX: OPERATIVE ULTRASOUND: SHX5996

## 2023-04-02 HISTORY — PX: TANDEM RING INSERTION: SHX6199

## 2023-04-02 LAB — RAD ONC ARIA SESSION SUMMARY
Course Elapsed Days: 78
Plan Fractions Treated to Date: 1
Plan Prescribed Dose Per Fraction: 5.5 Gy
Plan Total Fractions Prescribed: 1
Plan Total Prescribed Dose: 5.5 Gy
Reference Point Dosage Given to Date: 23.3646 Gy
Reference Point Dosage Given to Date: 25.6832 Gy
Reference Point Dosage Given to Date: 27.5 Gy
Reference Point Dosage Given to Date: 48.8247 Gy
Reference Point Dosage Given to Date: 56.181 Gy
Reference Point Session Dosage Given: 10.7617 Gy
Reference Point Session Dosage Given: 4.3074 Gy
Reference Point Session Dosage Given: 4.7962 Gy
Reference Point Session Dosage Given: 5.5 Gy
Reference Point Session Dosage Given: 9.4277 Gy
Session Number: 35

## 2023-04-02 SURGERY — INSERTION, UTERINE TANDEM AND RING OR CYLINDER, FOR BRACHYTHERAPY
Anesthesia: General | Site: Vagina

## 2023-04-02 MED ORDER — ONDANSETRON HCL 4 MG/2ML IJ SOLN
INTRAMUSCULAR | Status: AC
  Filled 2023-04-02: qty 2

## 2023-04-02 MED ORDER — PROPOFOL 10 MG/ML IV BOLUS
INTRAVENOUS | Status: DC | PRN
Start: 2023-04-02 — End: 2023-04-02
  Administered 2023-04-02: 200 ug/kg/min via INTRAVENOUS
  Administered 2023-04-02: 200 mg via INTRAVENOUS

## 2023-04-02 MED ORDER — MEPERIDINE HCL 25 MG/ML IJ SOLN
25.0000 mg | Freq: Once | INTRAMUSCULAR | Status: AC
  Administered 2023-04-02: 25 mg via INTRAVENOUS
  Filled 2023-04-02: qty 1

## 2023-04-02 MED ORDER — PROPOFOL 1000 MG/100ML IV EMUL
INTRAVENOUS | Status: AC
  Filled 2023-04-02: qty 100

## 2023-04-02 MED ORDER — LACTATED RINGERS IV SOLN
INTRAVENOUS | Status: DC
  Filled 2023-04-02 (×2): qty 250

## 2023-04-02 MED ORDER — LACTATED RINGERS IV SOLN
INTRAVENOUS | Status: DC
  Administered 2023-04-02: 1000 mL via INTRAVENOUS

## 2023-04-02 MED ORDER — OXYCODONE HCL 5 MG PO TABS
5.0000 mg | ORAL_TABLET | Freq: Once | ORAL | Status: AC
  Administered 2023-04-02: 5 mg via ORAL
  Filled 2023-04-02: qty 1

## 2023-04-02 MED ORDER — ONDANSETRON HCL 4 MG/2ML IJ SOLN
INTRAMUSCULAR | Status: DC | PRN
  Administered 2023-04-02: 4 mg via INTRAVENOUS

## 2023-04-02 MED ORDER — WHITE PETROLATUM EX OINT
TOPICAL_OINTMENT | CUTANEOUS | Status: AC
  Filled 2023-04-02: qty 5

## 2023-04-02 MED ORDER — OXYCODONE HCL 5 MG/5ML PO SOLN: 5.0000 mg | Freq: Once | ORAL | Status: DC | PRN

## 2023-04-02 MED ORDER — FENTANYL CITRATE (PF) 100 MCG/2ML IJ SOLN
INTRAMUSCULAR | Status: AC
  Filled 2023-04-02: qty 2

## 2023-04-02 MED ORDER — OXYCODONE HCL 5 MG PO TABS: 5.0000 mg | ORAL_TABLET | Freq: Once | ORAL | Status: DC | PRN

## 2023-04-02 MED ORDER — LIDOCAINE 2% (20 MG/ML) 5 ML SYRINGE
INTRAMUSCULAR | Status: DC | PRN
  Administered 2023-04-02: 60 mg via INTRAVENOUS

## 2023-04-02 MED ORDER — FENTANYL CITRATE (PF) 100 MCG/2ML IJ SOLN
INTRAMUSCULAR | Status: DC | PRN
  Administered 2023-04-02: 50 ug via INTRAVENOUS

## 2023-04-02 MED ORDER — DEXAMETHASONE SODIUM PHOSPHATE 10 MG/ML IJ SOLN
INTRAMUSCULAR | Status: AC
  Filled 2023-04-02: qty 1

## 2023-04-02 MED ORDER — ONDANSETRON HCL 4 MG/2ML IJ SOLN
4.0000 mg | Freq: Once | INTRAMUSCULAR | Status: AC | PRN
  Administered 2023-04-02: 4 mg via INTRAVENOUS

## 2023-04-02 MED ORDER — LIDOCAINE HCL (PF) 2 % IJ SOLN
INTRAMUSCULAR | Status: AC
  Filled 2023-04-02: qty 5

## 2023-04-02 MED ORDER — DEXAMETHASONE SODIUM PHOSPHATE 10 MG/ML IJ SOLN
INTRAMUSCULAR | Status: DC | PRN
  Administered 2023-04-02: 5 mg via INTRAVENOUS

## 2023-04-02 MED ORDER — FENTANYL CITRATE (PF) 100 MCG/2ML IJ SOLN
25.0000 ug | INTRAMUSCULAR | Status: DC | PRN
  Administered 2023-04-02: 25 ug via INTRAVENOUS

## 2023-04-02 MED ORDER — SODIUM CHLORIDE 0.9 % IR SOLN
Status: DC | PRN
  Administered 2023-04-02: 1000 mL via INTRAVESICAL

## 2023-04-02 SURGICAL SUPPLY — 20 items
BNDG CMPR 75X21 PLY HI ABS (MISCELLANEOUS)
DILATOR CANAL MILEX (MISCELLANEOUS) IMPLANT
DRSG TELFA 3X8 NADH STRL (GAUZE/BANDAGES/DRESSINGS) ×1 IMPLANT
GAUZE 4X4 16PLY ~~LOC~~+RFID DBL (SPONGE) ×2 IMPLANT
GAUZE PAD ABD 8X10 STRL (GAUZE/BANDAGES/DRESSINGS) ×1 IMPLANT
GAUZE STRETCH 2X75IN STRL (MISCELLANEOUS) IMPLANT
GLOVE BIO SURGEON STRL SZ7.5 (GLOVE) ×2 IMPLANT
GOWN STRL REUS W/TWL LRG LVL3 (GOWN DISPOSABLE) ×1 IMPLANT
HOLDER FOLEY CATH W/STRAP (MISCELLANEOUS) ×1 IMPLANT
IV NS 1000ML (IV SOLUTION) ×1
IV NS 1000ML BAXH (IV SOLUTION) ×1 IMPLANT
IV SET ADMIN PUMP GEMINI W/NLD (IV SETS) ×1 IMPLANT
KIT TURNOVER CYSTO (KITS) ×1 IMPLANT
MAT PREVALON FULL STRYKER (MISCELLANEOUS) ×1 IMPLANT
PACK VAGINAL MINOR WOMEN LF (CUSTOM PROCEDURE TRAY) ×1 IMPLANT
PACKING VAGINAL (PACKING) IMPLANT
SLEEVE SCD COMPRESS KNEE MED (STOCKING) ×1 IMPLANT
TOWEL OR 17X24 6PK STRL BLUE (TOWEL DISPOSABLE) ×1 IMPLANT
TRAY FOLEY W/BAG SLVR 14FR LF (SET/KITS/TRAYS/PACK) ×1 IMPLANT
WATER STERILE IRR 500ML POUR (IV SOLUTION) ×1 IMPLANT

## 2023-04-02 NOTE — Transfer of Care (Signed)
Immediate Anesthesia Transfer of Care Note  Patient: [name]  Procedure(s) Performed: Procedure(s) (LRB): TANDEM RING INSERTION (N/A) OPERATIVE ULTRASOUND (N/A)  Patient Location: PACU  Anesthesia Type: General  Level of Consciousness: awake, oriented, sedated and patient cooperative  Airway & Oxygen Therapy: Patient Spontanous Breathing and Patient connected to face mask oxygen  Post-op Assessment: Report given to PACU RN and Post -op Vital signs reviewed and stable  Post vital signs: Reviewed and stable  Complications: No apparent anesthesia complications Last Vitals:  Vitals Value Taken Time  BP    Temp    Pulse    Resp    SpO2      Last Pain:  Vitals:   04/02/23 0650  TempSrc: Oral  PainSc: 3       Patients Stated Pain Goal: 4 (04/02/23 0650)  Complications: No notable events documented.

## 2023-04-02 NOTE — Anesthesia Procedure Notes (Signed)
Procedure Name: LMA Insertion Date/Time: 04/02/2023 8:38 AM  Performed by: Francie Massing, CRNAPre-anesthesia Checklist: Patient identified, Emergency Drugs available, Suction available and Patient being monitored Patient Re-evaluated:Patient Re-evaluated prior to induction Oxygen Delivery Method: Circle system utilized Preoxygenation: Pre-oxygenation with 100% oxygen Induction Type: IV induction Ventilation: Mask ventilation without difficulty LMA: LMA inserted LMA Size: 4.0 Number of attempts: 1 Airway Equipment and Method: Bite block Placement Confirmation: positive ETCO2 Tube secured with: Tape Dental Injury: Teeth and Oropharynx as per pre-operative assessment

## 2023-04-02 NOTE — Interval H&P Note (Signed)
History and Physical Interval Note:  04/02/2023 8:19 AM  [name]  has presented today for surgery, with the diagnosis of CERVICAL CANCER.  The various methods of treatment have been discussed with the patient and family. After consideration of risks, benefits and other options for treatment, the patient has consented to  Procedure(s): TANDEM RING INSERTION (N/A) OPERATIVE ULTRASOUND (N/A) as a surgical intervention.  The patient's history has been reviewed, patient examined, no change in status, stable for surgery.  I have reviewed the patient's chart and labs.  Questions were answered to the patient's satisfaction.     Antony Blackbird

## 2023-04-02 NOTE — Op Note (Signed)
04/02/2023  9:38 AM  PATIENT:  [name]  72 y.o. female  PRE-OPERATIVE DIAGNOSIS:  CERVICAL CANCER  POST-OPERATIVE DIAGNOSIS:  CERVICAL CANCER  PROCEDURE:  Procedure(s): TANDEM RING INSERTION (N/A) OPERATIVE ULTRASOUND (N/A)  SURGEON:  Surgeons and Role:    Antony Blackbird, MD - Primary  PHYSICIAN ASSISTANT:   ASSISTANTS: none   ANESTHESIA:   general  EBL:  0 mL   BLOOD ADMINISTERED:none  DRAINS: Urinary Catheter (Foley)   LOCAL MEDICATIONS USED:  NONE  SPECIMEN:  No Specimen  DISPOSITION OF SPECIMEN:  N/A  COUNTS:  YES  TOURNIQUET:  * No tourniquets in log *  DICTATION:  [facility]  Patient was prepped and draped in the usual sterile fashion and placed in the dorsolithotomy position.  Timeout for the procedure, preoperative medications, antibiotics and allergies was performed. With the assistance of the ultrasonographer the patient underwent transvaginal ultrasound of the cervix.  Size measurements are recorded in the ultrasound report with overall significant shrinkage of the cervical mass compared to her pretreatment tumor size.  Transvaginal ultrasound measurements of the cervix were length 1.6 cm, height 2.2 cm and width 2.4 cm.   the cervical os was somewhat flush with the upper vaginal vault.  Some radiation changes noted along the cervical os area.  A Foley catheter was placed without difficulty.  The bladder was backfilled with approximately 200 cc of sterile saline for transabdominal ultrasound imaging purposes.  on ultrasound the uterus was moderately anteverted.  The patient proceeded to undergo dilation and sounding.  The ultrasound images were much better this week.  The sound equipment could be identified in the midportion of the uterus and sounding proceeded to the fundus.  The uterus was estimated to be approximately 7 cm in length.  The cervical os was dilated.  The patient then had a 60 mm 45 degree tandem  with the cervical sleeve placed within the endocervical and endometrial cavity.  Ultrasound imaging confirmed good placement. The patient then had placement of a 45 degree cervical ring attached to the tandem.  A large shielding cap was placed along the cervical ring.  A rectal paddle was then attached to the tandem ring apparatus.  Ultrasound at the conclusion of the procedure confirmed good placement of the tandem ring apparatus.  the patient tolerated the procedure well.  She was subsequently transferred to the recovery room in stable condition.  Later in the day the patient will be transferred to radiation oncology for planning and her fifth high-dose-rate treatment. Plan is the patient to  receive 5.5 Wallace Cullens to the high risk clinical target volume.  This last treatment will complete her definitive course of radiation therapy.  PLAN OF CARE:  Transfer to radiation oncology for planning and treatment  PATIENT DISPOSITION:  PACU - hemodynamically stable.   Delay start of Pharmacological VTE agent (>24hrs) due to surgical blood loss or risk of bleeding: not applicable

## 2023-04-02 NOTE — Anesthesia Postprocedure Evaluation (Signed)
Anesthesia Post Note  Patient: [name]  Procedure(s) Performed: TANDEM RING INSERTION (Vagina ) OPERATIVE ULTRASOUND (Vagina )     Patient location during evaluation: PACU Anesthesia Type: General Level of consciousness: awake and alert Pain management: pain level controlled Vital Signs Assessment: post-procedure vital signs reviewed and stable Respiratory status: spontaneous breathing, nonlabored ventilation, respiratory function stable and patient connected to nasal cannula oxygen Cardiovascular status: blood pressure returned to baseline and stable Postop Assessment: no apparent nausea or vomiting Anesthetic complications: no  No notable events documented.  Last Vitals:  Vitals:   04/02/23 0650 04/02/23 0921  BP: (!) 157/84 119/65  Pulse: 95 76  Resp: 17 15  Temp: 37.1 C 36.5 C  SpO2: 98% 97%    Last Pain:  Vitals:   04/02/23 0921  TempSrc:   PainSc: 4                  , S

## 2023-04-02 NOTE — Patient Instructions (Signed)
IMMEDIATELY FOLLOWING SURGERY: Do not drive or operate machinery for the first twenty four hours after surgery. Do not make any important decisions for twenty four hours after surgery or while taking narcotic pain medications or sedatives. If you develop intractable nausea and vomiting or a severe headache please notify your doctor immediately.   FOLLOW-UP: You do not need to follow up with anesthesia unless specifically instructed to do so.   WOUND CARE INSTRUCTIONS (if applicable): Expect some mild vaginal bleeding, but if large amount of bleeding occurs please contact Dr. Roselind Messier at [phone] or the Radiation On-Call physician. Call for any fever greater than 101.0 degrees or increasing vaginal//abdominal pain or trouble urinating.   QUESTIONS?: Please feel free to call your physician or the hospital operator if you have any questions, and they will be happy to assist you. Resume all medications: as listed on your after visit summary. Your next appointment is:  Future Appointments  Date Time Provider Department Center  04/04/2023 11:05 AM [facility]  04/24/2023  3:00 PM [name], MD [facility]  07/22/2023  1:45 PM [facility] V[facility]  01/19/2024 10:30 AM Nita Sickle K, DO [facility] None

## 2023-04-03 ENCOUNTER — Encounter (HOSPITAL_BASED_OUTPATIENT_CLINIC_OR_DEPARTMENT_OTHER): Payer: Self-pay | Admitting: Radiation Oncology

## 2023-04-04 ENCOUNTER — Other Ambulatory Visit (HOSPITAL_COMMUNITY): Payer: Self-pay

## 2023-04-04 ENCOUNTER — Other Ambulatory Visit: Payer: Self-pay

## 2023-04-04 ENCOUNTER — Ambulatory Visit: Payer: Medicare Other | Admitting: Physician Assistant

## 2023-04-04 VITALS — BP 122/78 | HR 79 | Ht 66.0 in | Wt 271.2 lb

## 2023-04-04 DIAGNOSIS — R600 Localized edema: Secondary | ICD-10-CM

## 2023-04-04 DIAGNOSIS — I4819 Other persistent atrial fibrillation: Secondary | ICD-10-CM

## 2023-04-04 DIAGNOSIS — Z95818 Presence of other cardiac implants and grafts: Secondary | ICD-10-CM

## 2023-04-04 DIAGNOSIS — C538 Malignant neoplasm of overlapping sites of cervix uteri: Secondary | ICD-10-CM | POA: Diagnosis not present

## 2023-04-04 MED ORDER — FUROSEMIDE 20 MG PO TABS
60.0000 mg | ORAL_TABLET | Freq: Two times a day (BID) | ORAL | 3 refills | Status: DC
Start: 2023-04-04 — End: 2024-05-17
  Filled 2023-04-04 – 2023-05-22 (×2): qty 540, 90d supply, fill #0
  Filled 2023-08-16: qty 540, 90d supply, fill #1
  Filled 2023-11-20: qty 540, 90d supply, fill #2
  Filled 2024-02-22: qty 540, 90d supply, fill #3

## 2023-04-04 MED ORDER — POTASSIUM CHLORIDE ER 10 MEQ PO TBCR
30.0000 meq | EXTENDED_RELEASE_TABLET | Freq: Two times a day (BID) | ORAL | 3 refills | Status: DC
  Filled 2023-04-04: qty 540, 90d supply, fill #0

## 2023-04-04 NOTE — Patient Instructions (Signed)
Medication Instructions:  Increase Lasix to 60 mg twice a day  Increase Potassium to 30 meq twice a day   *If you need a refill on your cardiac medications before your next appointment, please call your pharmacy*   Lab Work: None ordered   If you have labs (blood work) drawn today and your tests are completely normal, you will receive your results only by: MyChart Message (if you have MyChart) OR A paper copy in the mail If you have any lab test that is abnormal or we need to change your treatment, we will call you to review the results.   Testing/Procedures: None ordered    Follow-Up: Follow up as scheduled   Other Instructions

## 2023-04-04 NOTE — Progress Notes (Addendum)
[facility]   MULTIDISCIPLINARY HEART VALVE CLINIC                                     Cardiology Office Note:    Date:  04/04/2023   ID:  [name], [name] 29-Jan-1951, MRN [id number]  PCP:  [name], MD  [facility] Cardiologist:  None  [facility] Electrophysiologist:  Lanier Prude, MD   Referring MD: [name], MD   6 months s/p Watchman  History of Present Illness:    [name] is a 72 y.o. female with a hx of persistent atrial fibrillation s/p AF ablation 04/16/22 and LAAO on 10/03/22 with Dr. Lalla Brothers who presents to clinic for follow up.   She was seen by the AF Clinic 05/2022 and was referred to Dr. Lalla Brothers for ablation. Given her joint pain with arthritis, she was interested in pursuing LAAO closure to allow for NSAIDS without the increased risk of bleeding associated with concomitant anticoagulant use.   She underwent left atrial appendage occlusive device placement with 31mm Watchman FLX device. She was continued Eliquis 5mg  PO BID x 45 days (3/24) then transitioned to Plavix 75mg  PO daily (3/25) to complete 6 months of post implant therapy. Repeat CT 11/28/22 showed normal function with no leak or thrombus.   Noted to have vaginal bleeding at follow up and subsequently diagnosed with cervical cancer. Has her last radiation treatment next week.   Today the patient presents to clinic for follow up. She stopped the Plavix about 2 months ago as advise dby her oncologist. No CP or SOB. No orthopnea or PND. No dizziness or syncope. No blood in stool or urine. No palpitations. Has had worsening of her chronic LE edema recently.     Past Medical History:  Diagnosis Date   Allergy    Cataract    BILATERAL-REMOVED 2 YEARS AGO   Cervical cancer (HCC) 2024   COVID 09/2021   Encounter for blood transfusion 01/19/2023   2 units given   Erythromelalgia [facility])    followed by neuro Dr Allena Katz , mgd on gabapentin , dx several years ago     GERD (gastroesophageal reflux disease)    Helicobacter pylori gastritis 10/05/2018   History of atrial fibrillation 09/2022   s/p watchman implantation   Hx of colonic polyp - ssp 11/03/2014   Hypercalcemia    Hypertension    Left maxillary fracture (HCC) 07/17/2017   fell down my stairs 2 year ago , deneis any metal in place nor difficulty with jax extension    Neuropathy    feet and hands   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 Lalla Brothers   Pseudogout of foot    Rheumatoid arthritis(714.0) dx 2010   Shingles    hx of    Tibial plateau fracture, right, closed, initial encounter 07/16/2017    Past Surgical History:  Procedure Laterality Date   ATRIAL FIBRILLATION ABLATION N/A 04/16/2022   Procedure: ATRIAL FIBRILLATION ABLATION;  Surgeon: Lanier Prude, MD;  Location: [facility] INVASIVE CV LAB;  Service: Cardiovascular;  Laterality: N/A;   BREAST BIOPSY  1972   Broken wrist Bilateral 2010   CARDIOVERSION N/A 11/22/2021   Procedure: CARDIOVERSION;  Surgeon: Sande Rives, MD;  Location: [facility] ENDOSCOPY;  Service: Cardiovascular;  Laterality: N/A;   CATARACT EXTRACTION Bilateral 03/2012   COLONOSCOPY     COSMETIC SURGERY     breast   FRACTURE SURGERY     INNER EAR SURGERY     busted ear drum   LEFT ATRIAL APPENDAGE OCCLUSION N/A 10/03/2022   Procedure: LEFT ATRIAL APPENDAGE OCCLUSION;  Surgeon: Lanier Prude, MD;  Location: [facility] INVASIVE CV LAB;  Service: Cardiovascular;  Laterality: N/A;   MAXIMUM ACCESS (MAS)POSTERIOR LUMBAR INTERBODY FUSION (PLIF) 1 LEVEL N/A 10/11/2016   Procedure: Lumbar one-Sacral one Maximum access posterior lumbar interbody fusion;  Surgeon: Maeola Harman, MD;   Location: [facility] OR;  Service: Neurosurgery;  Laterality: N/A;   OPERATIVE ULTRASOUND N/A 03/05/2023   Procedure: OPERATIVE ULTRASOUND;  Surgeon: Antony Blackbird, MD;  Location: [facility];  Service: Urology;  Laterality: N/A;   OPERATIVE ULTRASOUND N/A 03/13/2023   Procedure: OPERATIVE ULTRASOUND;  Surgeon: Antony Blackbird, MD;  Location: [facility];  Service: Urology;  Laterality: N/A;   OPERATIVE ULTRASOUND N/A 03/20/2023   Procedure: OPERATIVE ULTRASOUND;  Surgeon: Antony Blackbird, MD;  Location: [facility];  Service: Urology;  Laterality: N/A;   OPERATIVE ULTRASOUND N/A 03/26/2023   Procedure: OPERATIVE ULTRASOUND;  Surgeon: Antony Blackbird, MD;  Location: [facility];  Service: Urology;  Laterality: N/A;   OPERATIVE ULTRASOUND N/A 04/02/2023   Procedure: OPERATIVE ULTRASOUND;  Surgeon: Antony Blackbird, MD;  Location: [facility];  Service: Urology;  Laterality: N/A;   ORIF WRIST FRACTURE Right 07/18/2017   Procedure: OPEN REDUCTION INTERNAL FIXATION (ORIF) WRIST FRACTURE;  Surgeon: Sheral Apley, MD;  Location: [facility] OR;  Service: Orthopedics;  Laterality: Right;   PARATHYROIDECTOMY Left 04/12/2019   Procedure: LEFT SUPERIOR PARATHYROIDECTOMY;  Surgeon: Darnell Level, MD;  Location: [facility] ORS;  Service: General;  Laterality: Left;   TANDEM RING INSERTION N/A 03/05/2023   Procedure: TANDEM RING INSERTION;  Surgeon: Antony Blackbird, MD;  Location: [facility];  Service: Urology;  Laterality: N/A;   TANDEM RING INSERTION N/A 03/13/2023   Procedure: TANDEM RING INSERTION;  Surgeon: Antony Blackbird, MD;  Location: [facility];  Service: Urology;  Laterality: N/A;   TANDEM RING INSERTION N/A 03/20/2023   Procedure: TANDEM RING INSERTION;  Surgeon: Antony Blackbird, MD;  Location: [facility];  Service: Urology;  Laterality: N/A;   TANDEM RING INSERTION N/A 03/26/2023   Procedure: TANDEM RING INSERTION;   Surgeon: Antony Blackbird, MD;  Location: [facility];  Service: Urology;  Laterality: N/A;   TANDEM RING INSERTION N/A 04/02/2023   Procedure: TANDEM RING INSERTION;  Surgeon: Antony Blackbird, MD;  Location: [facility];  Service: Urology;  Laterality: N/A;   TEE WITHOUT CARDIOVERSION N/A 10/03/2022   Procedure: TRANSESOPHAGEAL ECHOCARDIOGRAM (TEE);  Surgeon: Lanier Prude, MD;  Location: [facility] INVASIVE CV LAB;  Service: Cardiovascular;  Laterality: N/A;   watchman implantation  10/03/2022    Current Medications: Current Meds  Medication Sig   acetaminophen (TYLENOL) 650 MG CR tablet Take 1,300 mg by mouth every 8 (eight) hours as needed for pain.   amoxicillin (AMOXIL) 500 MG capsule Take 4 capsules (2,000 mg total) by mouth as directed.   BIOTIN PO Take 1 tablet by mouth at bedtime.  Cholecalciferol (VITAMIN D) 125 MCG (5000 UT) CAPS Take 5,000 Units by mouth in the morning.   ferrous sulfate (FEROSUL) 325 (65 FE) MG tablet Take 1 tablet (325 mg total) by mouth daily.   fluticasone (FLONASE) 50 MCG/ACT nasal spray Place 1-2 sprays into both nostrils daily as needed for allergies or rhinitis.   gabapentin (NEURONTIN) 100 MG capsule TAKE 3 CAPSULES BY MOUTH IN THE MORNING AND 2 CAPSULES AT BEDTIME. OK TO TAKE EXTRA CAPSULE AT BEDTIME AS NEEDED   hydroxychloroquine (PLAQUENIL) 200 MG tablet Take 1 tablet (200 mg total) by mouth 2 (two) times daily with food or milk   hydroxypropyl methylcellulose / hypromellose (ISOPTO TEARS / GONIOVISC) 2.5 % ophthalmic solution Place 1 drop into both eyes in the morning.   losartan (COZAAR) 100 MG tablet TAKE 1 TABLET BY MOUTH ONCE A DAY   Menthol, Topical Analgesic, (BIOFREEZE) 4 % GEL Apply 1 application  topically at bedtime. Neuropathy feet/ankles   metoprolol succinate (TOPROL-XL) 50 MG 24 hr tablet Take 1 tablet (50 mg total) by mouth 2 (two) times daily.   Multiple Vitamins-Minerals (MULTIVITAMIN WITH MINERALS) tablet Take 2  tablets by mouth daily.   oxyCODONE-acetaminophen (PERCOCET/ROXICET) 5-325 MG tablet Take 1 tablet by mouth every 6 (six) hours as needed for severe pain.   potassium chloride (KLOR-CON) 10 MEQ tablet Take 3 tablets (30 mEq total) by mouth 2 (two) times daily.   potassium chloride SA (KLOR-CON M) 20 MEQ tablet Take 1 tablet by mouth 2 times daily.   prochlorperazine (COMPAZINE) 10 MG tablet Take 1 tablet (10 mg) by mouth every 6 hours as needed for nausea or vomiting.   sulfaSALAzine (AZULFIDINE) 500 MG tablet Take 2 tablets  by mouth 2 times daily.   vitamin C (ASCORBIC ACID) 500 MG tablet Take 500 mg by mouth daily.   [DISCONTINUED] furosemide (LASIX) 20 MG tablet Take 2 - 3 tablets (40 - 60 mg total) by mouth in the morning and at bedtime. (Patient taking differently: Take 40-60 mg by mouth in the morning and at bedtime. Take 3 tablets (60 mg) in the morning and Take 2 tablets (40 mg) at bedtime)     Allergies:   Amlodipine besylate, Divalproex sodium, Nortriptyline, Hydromorphone hcl, Other, Rosuvastatin, and Tramadol   Social History   Socioeconomic History   Marital status: Married    Spouse name: Not on file   Number of children: 3   Years of education: Not on file   Highest education level: 11th grade  Occupational History   Occupation: Retired  Tobacco Use   Smoking status: Former    Current packs/day: 0.00    Average packs/day: 4.0 packs/day for 4.0 years (16.0 ttl pk-yrs)    Types: Cigarettes    Start date: 81    Quit date: 1985    Years since quitting: 39.6   Smokeless tobacco: Never  Vaping Use   Vaping status: Never Used  Substance and Sexual Activity   Alcohol use: No    Alcohol/week: 0.0 standard drinks of alcohol   Drug use: No   Sexual activity: Not Currently  Other Topics Concern   Not on file  Social History Narrative   Artist -retired Designer, fashion/clothing   Married, lives with spouse, Dannielle Huh, he is IT support for [employer]   3 sons   2  caffeinated beverages a day   No regular exercise, diet is ok   Right handed   Social Determinants of Dispensing optician  Resource Strain: Low Risk  (07/18/2022)   Overall Financial Resource Strain (CARDIA)    Difficulty of Paying Living Expenses: Not hard at all  Food Insecurity: No Food Insecurity (01/19/2023)   Hunger Vital Sign    Worried About Running Out of Food in the Last Year: Never true    Ran Out of Food in the Last Year: Never true  Transportation Needs: No Transportation Needs (01/22/2023)   PRAPARE - Administrator, Civil Service (Medical): No    Lack of Transportation (Non-Medical): No  Physical Activity: Inactive (07/18/2022)   Exercise Vital Sign    Days of Exercise per Week: 0 days    Minutes of Exercise per Session: 0 min  Stress: No Stress Concern Present (07/18/2022)   [employer] of Occupational Health - Occupational Stress Questionnaire    Feeling of Stress : Not at all  Social Connections: Socially Integrated (07/18/2022)   Social Connection and Isolation Panel [NHANES]    Frequency of Communication with Friends and Family: More than three times a week    Frequency of Social Gatherings with Friends and Family: More than three times a week    Attends Religious Services: More than 4 times per year    Active Member of [employer] or Organizations: Yes    Attends Engineer, structural: More than 4 times per year    Marital Status: Married     Family History: The patient's family history includes Heart attack in her father; Hypertension in her brother, father, and sister; Hyperthyroidism in her sister; Hypothyroidism in her brother; Liver cancer in her sister; Stroke in her mother. There is no history of Colon cancer, Esophageal cancer, Rectal cancer, Stomach cancer, Breast cancer, Ovarian cancer, Endometrial cancer, Pancreatic cancer, or Prostate cancer.  ROS:   Please see the history of present illness.    All other systems reviewed and are  negative.  EKGs/Labs/Other Studies Reviewed:    Cardiac Studies & Procedures       ECHOCARDIOGRAM  ECHOCARDIOGRAM COMPLETE 10/14/2021  Narrative ECHOCARDIOGRAM REPORT    Patient Name:   [name] Date of Exam: 10/14/2021 Medical Rec #:  [id number]        Height:       65.0 in Accession #:    [id number]       Weight:       257.9 lb Date of Birth:  1951/03/23        BSA:          2.204 m Patient Age:    70 years         BP:           165/93 mmHg Patient Gender: F                HR:           75 bpm. Exam Location:  Inpatient  Procedure: 2D Echo, Color Doppler, Cardiac Doppler and Intracardiac Opacification Agent  Indications:    CHF-Acute Diastolic I50.31  History:        Patient has prior history of Echocardiogram examinations, most recent 01/01/2017. Risk Factors:Hypertension. GERD.  Sonographer:    Leta Jungling RDCS Referring Phys: [phone] STEPHEN K CHIU  IMPRESSIONS   1. Left ventricular ejection fraction, by estimation, is 55 to 60%. The left ventricle has normal function. The left ventricle has no regional wall motion abnormalities. The left ventricular internal cavity size was mildly dilated. There is mild left ventricular hypertrophy. Left ventricular diastolic function  could not be evaluated. 2. Right ventricular systolic function is mildly reduced. The right ventricular size is mildly enlarged. There is mildly elevated pulmonary artery systolic pressure. 3. The mitral valve is normal in structure. Mild mitral valve regurgitation. No evidence of mitral stenosis. 4. The aortic valve is tricuspid. Aortic valve regurgitation is trivial. Aortic valve sclerosis is present, with no evidence of aortic valve stenosis. 5. Aortic dilatation noted. There is mild dilatation of the aortic root, measuring 41 mm. There is mild dilatation of the ascending aorta, measuring 41 mm. 6. The inferior vena cava is dilated in size with >50% respiratory variability, suggesting right  atrial pressure of 8 mmHg.  FINDINGS Left Ventricle: Left ventricular ejection fraction, by estimation, is 55 to 60%. The left ventricle has normal function. The left ventricle has no regional wall motion abnormalities. Definity contrast agent was given IV to delineate the left ventricular endocardial borders. The left ventricular internal cavity size was mildly dilated. There is mild left ventricular hypertrophy. Left ventricular diastolic function could not be evaluated due to atrial fibrillation. Left ventricular diastolic function could not be evaluated.  Right Ventricle: The right ventricular size is mildly enlarged. Right ventricular systolic function is mildly reduced. There is mildly elevated pulmonary artery systolic pressure. The tricuspid regurgitant velocity is 2.70 m/s, and with an assumed right atrial pressure of 8 mmHg, the estimated right ventricular systolic pressure is 37.2 mmHg.  Left Atrium: Left atrial size was normal in size.  Right Atrium: Right atrial size was normal in size.  Pericardium: There is no evidence of pericardial effusion.  Mitral Valve: The mitral valve is normal in structure. Mild mitral valve regurgitation. No evidence of mitral valve stenosis.  Tricuspid Valve: The tricuspid valve is normal in structure. Tricuspid valve regurgitation is mild . No evidence of tricuspid stenosis.  Aortic Valve: The aortic valve is tricuspid. Aortic valve regurgitation is trivial. Aortic valve sclerosis is present, with no evidence of aortic valve stenosis.  Pulmonic Valve: The pulmonic valve was not well visualized. Pulmonic valve regurgitation is trivial. No evidence of pulmonic stenosis.  Aorta: Aortic dilatation noted. There is mild dilatation of the aortic root, measuring 41 mm. There is mild dilatation of the ascending aorta, measuring 41 mm.  Venous: The inferior vena cava is dilated in size with greater than 50% respiratory variability, suggesting right atrial  pressure of 8 mmHg.  IAS/Shunts: No atrial level shunt detected by color flow Doppler.   LEFT VENTRICLE PLAX 2D LVIDd:         5.50 cm      Diastology LVIDs:         3.90 cm      LV e' medial:    9.36 cm/s LV PW:         1.10 cm      LV E/e' medial:  11.2 LV IVS:        1.20 cm      LV e' lateral:   11.50 cm/s LVOT diam:     2.25 cm      LV E/e' lateral: 9.1 LV SV:         58 LV SV Index:   26 LVOT Area:     3.98 cm  LV Volumes (MOD) LV vol d, MOD A2C: 116.0 ml LV vol d, MOD A4C: 135.0 ml LV vol s, MOD A2C: 56.5 ml LV vol s, MOD A4C: 67.5 ml LV SV MOD A2C:     59.5 ml LV SV MOD A4C:  135.0 ml LV SV MOD BP:      64.5 ml  RIGHT VENTRICLE RV S prime:     11.40 cm/s TAPSE (M-mode): 1.7 cm  LEFT ATRIUM             Index        RIGHT ATRIUM           Index LA diam:        4.00 cm 1.81 cm/m   RA Area:     19.60 cm LA Vol (A2C):   58.8 ml 26.68 ml/m  RA Volume:   56.60 ml  25.68 ml/m LA Vol (A4C):   56.2 ml 25.50 ml/m LA Biplane Vol: 59.2 ml 26.86 ml/m AORTIC VALVE LVOT Vmax:   68.30 cm/s LVOT Vmean:  45.300 cm/s LVOT VTI:    0.146 m  AORTA Ao Root diam: 3.60 cm Ao Asc diam:  4.10 cm  MITRAL VALVE                TRICUSPID VALVE MV Area (PHT): 4.35 cm     TR Peak grad:   29.2 mmHg MV Decel Time: 175 msec     TR Vmax:        270.00 cm/s MV E velocity: 105.00 cm/s SHUNTS Systemic VTI:  0.15 m Systemic Diam: 2.25 cm  Olga Millers MD Electronically signed by Olga Millers MD Signature Date/Time: 10/14/2021/11:36:33 AM    Final   TEE  ECHO TEE 10/03/2022  Narrative TRANSESOPHOGEAL ECHO REPORT    Patient Name:   [name] Date of Exam: 10/03/2022 Medical Rec #:  [id number]        Height:       66.0 in Accession #:    [id number]       Weight:       268.2 lb Date of Birth:  09/19/1950        BSA:          2.265 m Patient Age:    71 years         BP:           150/89 mmHg Patient Gender: F                HR:           81 bpm. Exam Location:   Inpatient  Procedure: Transesophageal Echo, 3D Echo, Color Doppler and Cardiac Doppler  Indications:     I48.91* Unspecified atrial fibrillation  History:         Patient has prior history of Echocardiogram examinations, most recent 10/14/2021. Risk Factors:Hypertension and Dyslipidemia.  Sonographer:     Irving Burton Senior RDCS Referring Phys:  [id number] Lanier Prude Diagnosing Phys: Charlton Haws MD   Sonographer Comments: 31mm Watchman Device Implanted   PROCEDURE: After discussion of the risks and benefits of a TEE, an informed consent was obtained from the patient. The transesophogeal probe was passed without difficulty through the esophogus of the patient. Sedation performed by different physician. The patient was monitored while under deep sedation. The patient's vital signs; including heart rate, blood pressure, and oxygen saturation; remained stable throughout the procedure. The patient developed no complications during the procedure.  IMPRESSIONS   1. Windsock appendage with no thrombus. Well placed 31 mm Watchman FLX device with no lead by color flow and negative tugg test Average compressoin 20%. 2. Left ventricular ejection fraction, by estimation, is 60 to 65%. The left ventricle has normal function. The left ventricle has no regional  wall motion abnormalities. 3. Right ventricular systolic function is normal. The right ventricular size is normal. 4. Left atrial size was mildly dilated. No left atrial/left atrial appendage thrombus was detected. 5. No change in trivial effusion post procedure. 6. The mitral valve is normal in structure. Trivial mitral valve regurgitation. No evidence of mitral stenosis. 7. The aortic valve is tricuspid. There is mild calcification of the aortic valve. Aortic valve regurgitation is trivial. Aortic valve sclerosis is present, with no evidence of aortic valve stenosis. 8. Aortic dilatation noted. 9. The inferior vena cava is normal in size with  greater than 50% respiratory variability, suggesting right atrial pressure of 3 mmHg. 10. Post trans septal punture only left to right shunting.  Conclusion(s)/Recommendation(s): Normal biventricular function without evidence of hemodynamically significant valvular heart disease.  FINDINGS Left Ventricle: Left ventricular ejection fraction, by estimation, is 60 to 65%. The left ventricle has normal function. The left ventricle has no regional wall motion abnormalities. The left ventricular internal cavity size was normal in size. There is no left ventricular hypertrophy.  Right Ventricle: The right ventricular size is normal. No increase in right ventricular wall thickness. Right ventricular systolic function is normal.  Left Atrium: Left atrial size was mildly dilated. No left atrial/left atrial appendage thrombus was detected.  Right Atrium: Right atrial size was normal in size.  Pericardium: No change in trivial effusion post procedure. Trivial pericardial effusion is present. The pericardial effusion is anterior to the right ventricle.  Mitral Valve: The mitral valve is normal in structure. Trivial mitral valve regurgitation. No evidence of mitral valve stenosis.  Tricuspid Valve: The tricuspid valve is normal in structure. Tricuspid valve regurgitation is mild . No evidence of tricuspid stenosis.  Aortic Valve: The aortic valve is tricuspid. There is mild calcification of the aortic valve. Aortic valve regurgitation is trivial. Aortic valve sclerosis is present, with no evidence of aortic valve stenosis.  Pulmonic Valve: The pulmonic valve was normal in structure. Pulmonic valve regurgitation is not visualized. No evidence of pulmonic stenosis.  Aorta: Aortic dilatation noted.  Venous: The inferior vena cava is normal in size with greater than 50% respiratory variability, suggesting right atrial pressure of 3 mmHg.  IAS/Shunts: Post trans septal punture only left to right  shunting.  Additional Comments: Windsock appendage with no thrombus. Well placed 31 mm Watchman FLX device with no lead by color flow and negative tugg test Average compressoin 20%. Spectral Doppler performed.   AORTA Ao Root diam: 3.20 cm Ao Asc diam:  3.70 cm  Charlton Haws MD Electronically signed by Charlton Haws MD Signature Date/Time: 10/03/2022/11:01:10 AM    Final            EKG:  EKG is NOT ordered today.   Recent Labs: 11/19/2022: TSH 1.385 01/19/2023: ALT 14 01/20/2023: Magnesium 2.0 03/05/2023: BUN 15; Creatinine, Ser 0.96; Hemoglobin 11.7; Platelets 154; Potassium 3.4; Sodium 138  Recent Lipid Panel    Component Value Date/Time   CHOL 160 08/29/2021 1352   CHOL 225 (H) 06/21/2014 1146   TRIG 149.0 08/29/2021 1352   TRIG 169 (H) 06/21/2014 1146   HDL 42.30 08/29/2021 1352   HDL 48 06/21/2014 1146   CHOLHDL 4 08/29/2021 1352   VLDL 29.8 08/29/2021 1352   LDLCALC 88 08/29/2021 1352   LDLCALC 158 (H) 03/31/2020 0845   LDLCALC 143 (H) 06/21/2014 1146   LDLDIRECT 153.0 05/29/2021 1109     Risk Assessment/Calculations:    HAS-BLED score 3 Hypertension Yes  Abnormal renal  and liver function (Dialysis, transplant, Cr >2.26 mg/dL /Cirrhosis or Bilirubin >2x Normal or AST/ALT/AP >3x Normal) No  Stroke No  Bleeding No  Labile INR (Unstable/high INR) No  Elderly (>65) Yes  Drugs or alcohol (? 8 drinks/week, anti-plt or NSAID) Yes    CHA2DS2-VASc Score = 4  The patient's score is based upon: CHF History: 1 HTN History: 1 Diabetes History: 0 Stroke History: 0 Vascular Disease History: 0 Age Score: 1 Gender Score: 1  Physical Exam:    VS:  BP 122/78   Pulse 79   Ht 5\' 6"  (1.676 m)   Wt 271 lb 3.2 oz (123 kg)   SpO2 95%   BMI 43.77 kg/m     Wt Readings from Last 3 Encounters:  04/04/23 271 lb 3.2 oz (123 kg)  04/02/23 269 lb 11.2 oz (122.3 kg)  03/26/23 270 lb 12.8 oz (122.8 kg)     GEN:  Well nourished, well developed in no acute distress,  overweight HEENT: Normal NECK: No JVD LYMPHATICS: No lymphadenopathy CARDIAC: RRR, no murmurs, rubs, gallops RESPIRATORY:  Clear to auscultation without rales, wheezing or rhonchi  ABDOMEN: Soft, non-tender, non-distended MUSCULOSKELETAL:  2+ bilateral LE edema; No deformity  SKIN: Warm and dry NEUROLOGIC:  Alert and oriented x 3 PSYCHIATRIC:  Normal affect   ASSESSMENT:    1. Presence of Watchman left atrial appendage closure device   2. Persistent atrial fibrillation (HCC)   3. Lower extremity edema -chronic   4. Malignant neoplasm of overlapping sites of cervix (HCC)    PLAN:    In order of problems listed above:  Persistent atrial fibrillation s/p ablation and Watchman: now off Plavix given vaginal bleeding and new diagnosis of cervical cancer. Last XRT treatment next week. No longer needs SBE prophylaxis. Watchman team will follow up at 1 year mark.   LE edema: will increase Lasix to 60mg  BID and Kdur daily. She has follow up labs with the cancer center next month.   Cervical cancer: recently diagnosed after vaginal bleeding while on Plavix. Last XRT treatment next week.   Medication Adjustments/Labs and Tests Ordered: Current medicines are reviewed at length with the patient today.  Concerns regarding medicines are outlined above.  No orders of the defined types were placed in this encounter.  Meds ordered this encounter  Medications   potassium chloride (KLOR-CON) 10 MEQ tablet    Sig: Take 3 tablets (30 mEq total) by mouth 2 (two) times daily.    Dispense:  540 tablet    Refill:  3   furosemide (LASIX) 20 MG tablet    Sig: Take 3 tablets by mouth twice a day    Dispense:  540 tablet    Refill:  3    Patient Instructions  Medication Instructions:  Increase Lasix to 60 mg twice a day  Increase Potassium to 30 meq twice a day   *If you need a refill on your cardiac medications before your next appointment, please call your pharmacy*   Lab Work: None  ordered   If you have labs (blood work) drawn today and your tests are completely normal, you will receive your results only by: MyChart Message (if you have MyChart) OR A paper copy in the mail If you have any lab test that is abnormal or we need to change your treatment, we will call you to review the results.   Testing/Procedures: None ordered    Follow-Up: Follow up as scheduled   Other Instructions  Signed, Cline Crock, PA-C  04/04/2023 12:45 PM    [facility]

## 2023-04-07 NOTE — Radiation Completion Notes (Signed)
Patient Name: [name], [name] MRN: [id number] Date of Birth: [birth year] Referring Physician: [name], M.D. Date of Service: 2023-04-07 Radiation Oncologist: Arnette Schaumann, M.D. 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; C53.9 Malignant neoplasm of cervix uteri, unspecified Staging on 2022-12-24: Cervical cancer (HCC) T=cT2b, N=cN2a, M=cM0 Intent: Curative     ==========DELIVERED PLANS==========  First Treatment Date: 2023-01-14 - Last Treatment Date: 2023-04-02   Plan Name: Pelvis Site: Pelvis Technique: IMRT Mode: Photon Dose Per Fraction: 1.8 Gy Prescribed Dose (Delivered / Prescribed): 45 Gy / 45 Gy Prescribed Fxs (Delivered / Prescribed): 25 / 25   Plan Name: Pelvis_Bst Site: Pelvis Technique: 3D Mode: Photon Dose Per Fraction: 1.8 Gy Prescribed Dose (Delivered / Prescribed): 9 Gy / 9 Gy Prescribed Fxs (Delivered / Prescribed): 5 / 5   Plan Name: Cervix_HDR_F1 Site: Cervix Technique: HDR Ir-192 Mode: Brachytherapy Dose Per Fraction: 5.5 Gy Prescribed Dose (Delivered / Prescribed): 5.5 Gy / 5.5 Gy Prescribed Fxs (Delivered / Prescribed): 1 / 1   Plan Name: Cervix_HDR_F2 Site: Cervix Technique: HDR Ir-192 Mode: Brachytherapy Dose Per Fraction: 5.5 Gy Prescribed Dose (Delivered / Prescribed): 5.5 Gy / 5.5 Gy Prescribed Fxs (Delivered / Prescribed): 1 / 1   Plan Name: Cervix_HDR_F3 Site: Cervix Technique: HDR Ir-192 Mode: Brachytherapy Dose Per Fraction: 5.5 Gy Prescribed Dose (Delivered / Prescribed): 5.5 Gy / 5.5 Gy Prescribed Fxs (Delivered / Prescribed): 1 / 1   Plan Name: Cervix_HDR_F4 Site: Cervix Technique: HDR Ir-192 Mode: Brachytherapy Dose Per Fraction: 5.5 Gy Prescribed Dose (Delivered / Prescribed): 5.5 Gy / 5.5 Gy Prescribed Fxs (Delivered / Prescribed): 1 / 1   Plan Name:  Cervix_HDR_F5 Site: Cervix Technique: HDR Ir-192 Mode: Brachytherapy Dose Per Fraction: 5.5 Gy Prescribed Dose (Delivered / Prescribed): 5.5 Gy / 5.5 Gy Prescribed Fxs (Delivered / Prescribed): 1 / 1     ==========ON TREATMENT VISIT DATES========== 2023-01-14, 2023-01-20, 2023-01-31, 2023-02-04, 2023-02-11, 2023-02-18, 2023-03-05, 2023-03-13, 2023-03-20, 2023-03-26, 2023-04-02     ==========UPCOMING VISITS==========       ==========APPENDIX - ON TREATMENT VISIT NOTES==========   See weekly On Treatment Notes in Epic for details.

## 2023-04-11 ENCOUNTER — Other Ambulatory Visit (HOSPITAL_COMMUNITY): Payer: Self-pay

## 2023-04-17 ENCOUNTER — Other Ambulatory Visit (HOSPITAL_COMMUNITY): Payer: Self-pay

## 2023-04-22 ENCOUNTER — Other Ambulatory Visit: Payer: Self-pay

## 2023-04-22 ENCOUNTER — Other Ambulatory Visit: Payer: Self-pay | Admitting: Family Medicine

## 2023-04-23 ENCOUNTER — Other Ambulatory Visit (HOSPITAL_COMMUNITY): Payer: Self-pay

## 2023-04-23 DIAGNOSIS — M0609 Rheumatoid arthritis without rheumatoid factor, multiple sites: Secondary | ICD-10-CM | POA: Diagnosis not present

## 2023-04-23 MED ORDER — SULFASALAZINE 500 MG PO TABS
1000.0000 mg | ORAL_TABLET | Freq: Two times a day (BID) | ORAL | 2 refills | Status: DC
  Filled 2023-04-23: qty 120, 30d supply, fill #0
  Filled 2023-05-22: qty 120, 30d supply, fill #1
  Filled 2023-06-19: qty 120, 30d supply, fill #2

## 2023-04-23 MED ORDER — FERROUS SULFATE 325 (65 FE) MG PO TABS
325.0000 mg | ORAL_TABLET | Freq: Every day | ORAL | 0 refills | Status: DC
  Filled 2023-04-23 – 2023-11-05 (×2): qty 100, 100d supply, fill #0

## 2023-04-24 ENCOUNTER — Other Ambulatory Visit (HOSPITAL_COMMUNITY): Payer: Self-pay

## 2023-04-24 ENCOUNTER — Telehealth (INDEPENDENT_AMBULATORY_CARE_PROVIDER_SITE_OTHER): Payer: Medicare Other | Admitting: Family Medicine

## 2023-04-24 ENCOUNTER — Encounter: Payer: Self-pay | Admitting: Family Medicine

## 2023-04-24 DIAGNOSIS — I10 Essential (primary) hypertension: Secondary | ICD-10-CM

## 2023-04-24 MED ORDER — METOPROLOL SUCCINATE ER 50 MG PO TB24
50.0000 mg | ORAL_TABLET | Freq: Two times a day (BID) | ORAL | 1 refills | Status: DC
  Filled 2023-04-24: qty 270, 135d supply, fill #0
  Filled 2023-05-20: qty 180, 90d supply, fill #0
  Filled 2023-09-01: qty 180, 90d supply, fill #1
  Filled 2023-11-27: qty 180, 90d supply, fill #2

## 2023-04-24 MED ORDER — LOSARTAN POTASSIUM 100 MG PO TABS
100.0000 mg | ORAL_TABLET | Freq: Every day | ORAL | 1 refills | Status: DC
  Filled 2023-04-24 – 2023-07-16 (×2): qty 90, 90d supply, fill #0
  Filled 2023-09-14 – 2023-10-30 (×2): qty 90, 90d supply, fill #1

## 2023-04-24 NOTE — Assessment & Plan Note (Signed)
Chronic, stable, last BP done on 8/22 was in the normal range. Unable to obtain BP today since this is a video visit. Pt reports she is doing well on the current medications. Will refill her toprol and losartan today. She does report that the cardiologist increased her lasix to 60 mg daily.  Current hypertension medications:       Sig   furosemide (LASIX) 20 MG tablet (Taking) Take 3 tablets (60 mg total) by mouth 2 (two) times daily.   losartan (COZAAR) 100 MG tablet TAKE 1 TABLET BY MOUTH ONCE A DAY   metoprolol succinate (TOPROL-XL) 50 MG 24 hr tablet Take 1 tablet (50 mg total) by mouth 2 (two) times daily.

## 2023-04-24 NOTE — Progress Notes (Signed)
Virtual Medical Office Visit  Patient:  [name]      Age: 72 y.o.       Sex:  female  Date:   04/24/2023  PCP:    [name], MD   Today's Healthcare Provider: [name], MD    Assessment/Plan:   Summary assessment:  [name] was seen today for medical management of chronic issues.  Primary hypertension Assessment & Plan: Chronic, stable, last BP done on 8/22 was in the normal range. Unable to obtain BP today since this is a video visit. Pt reports she is doing well on the current medications. Will refill her toprol and losartan today. She does report that the cardiologist increased her lasix to 60 mg daily.  Current hypertension medications:       Sig   furosemide (LASIX) 20 MG tablet (Taking) Take 3 tablets (60 mg total) by mouth 2 (two) times daily.   losartan (COZAAR) 100 MG tablet TAKE 1 TABLET BY MOUTH ONCE A DAY   metoprolol succinate (TOPROL-XL) 50 MG 24 hr tablet Take 1 tablet (50 mg total) by mouth 2 (two) times daily.        Orders: -     Losartan Potassium; TAKE 1 TABLET BY MOUTH ONCE A DAY  Dispense: 90 tablet; Refill: 1 -     Metoprolol Succinate ER; Take 1 tablet (50 mg total) by mouth 2 (two) times daily.  Dispense: 270 tablet; Refill: 1     Return in about 6 months (around 10/22/2023).   She was advised to call the office or go to ER if her condition worsens    Subjective:   [name] is a 72 y.o. female with PMH significant for: Past Medical History:  Diagnosis Date   Allergy    Cataract    BILATERAL-REMOVED 2 YEARS AGO   Cervical cancer (HCC) 2024   COVID 09/2021   Encounter for blood transfusion 01/19/2023   2 units given   Erythromelalgia [facility])    followed by neuro Dr Allena Katz , mgd on gabapentin , dx several years ago    GERD (gastroesophageal reflux disease)    Helicobacter pylori gastritis 10/05/2018   History of atrial fibrillation 09/2022   s/p watchman implantation   Hx of colonic polyp - ssp 11/03/2014    Hypercalcemia    Hypertension    Left maxillary fracture (HCC) 07/17/2017   fell down my stairs 2 year ago , deneis any metal in place nor difficulty with jax extension    Neuropathy    feet and hands   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 Lalla Brothers   Pseudogout of foot    Rheumatoid arthritis(714.0) dx 2010   Shingles    hx of    Tibial plateau fracture, right, closed, initial encounter 07/16/2017     Presenting today with: Chief Complaint  Patient presents with   Medical Management of Chronic Issues     She clarifies and reports that her condition: HTN -- BP in office performed and is well controlled. She  reports no side effects to the medications, no chest pain, SOB, dizziness or headaches. She has  a BP cuff at home and is checking BP regularly, reports they are in the normal range.   Patient states she was diagnosed with cervical cancer after her last visit with me. States that she has been undergoing treatment for this with Dr. Roselind Messier. States that she was told that the treatments are working well.  I reviewed her last set of blood work, pt reports she also had blood work done yesterday at her rheumatologist's office.  She has no new symptoms or issues to report. We reviewed her medications and she needs refills today.   She denies having any: Chest pain, SOB, no side effects to the medication          Objective/Observations  Physical Exam:  Polite and friendly Gen: NAD, resting comfortably Pulm: Normal work of breathing Neuro: Grossly normal, moves all extremities Psych: Normal affect and thought content Problem specific physical exam findings:    No images are attached to  the encounter or orders placed in the encounter.    Results: No results found for any visits on 04/24/23.        Virtual Visit via Video   I connected with [name] on 04/24/23 at  3:00 PM EDT by a video enabled telemedicine application and verified that I am speaking with the correct person using two identifiers. The limitations of evaluation and management by telemedicine and the availability of in person appointments were discussed. The patient expressed understanding and agreed to proceed.   Percentage of appointment time on video:  100% Patient location: Home Provider location: [place] [facility] Persons participating in the virtual visit: Myself and Patient

## 2023-04-25 ENCOUNTER — Other Ambulatory Visit (HOSPITAL_COMMUNITY): Payer: Self-pay

## 2023-04-30 ENCOUNTER — Other Ambulatory Visit (HOSPITAL_COMMUNITY): Payer: Self-pay

## 2023-04-30 DIAGNOSIS — M0609 Rheumatoid arthritis without rheumatoid factor, multiple sites: Secondary | ICD-10-CM | POA: Diagnosis not present

## 2023-04-30 DIAGNOSIS — M5136 Other intervertebral disc degeneration, lumbar region: Secondary | ICD-10-CM | POA: Diagnosis not present

## 2023-04-30 DIAGNOSIS — I7381 Erythromelalgia: Secondary | ICD-10-CM | POA: Diagnosis not present

## 2023-04-30 MED ORDER — SULFASALAZINE 500 MG PO TABS
1000.0000 mg | ORAL_TABLET | Freq: Two times a day (BID) | ORAL | 1 refills | Status: DC
  Filled 2023-04-30 – 2023-07-16 (×3): qty 360, 90d supply, fill #0
  Filled 2023-09-14 – 2023-10-08 (×2): qty 360, 90d supply, fill #1

## 2023-04-30 MED ORDER — HYDROXYCHLOROQUINE SULFATE 200 MG PO TABS
200.0000 mg | ORAL_TABLET | Freq: Two times a day (BID) | ORAL | 1 refills | Status: DC
  Filled 2023-04-30: qty 180, 90d supply, fill #0
  Filled 2023-06-11 – 2023-07-16 (×2): qty 180, 90d supply, fill #1

## 2023-05-01 ENCOUNTER — Other Ambulatory Visit (HOSPITAL_COMMUNITY): Payer: Self-pay

## 2023-05-02 ENCOUNTER — Encounter: Payer: Self-pay | Admitting: Radiation Oncology

## 2023-05-04 NOTE — Progress Notes (Signed)
Radiation Oncology         (336) [phone] ________________________________  Name: [name] MRN: [id number]  Date: 05/05/2023  DOB: [birth year]  End of Treatment Note  Diagnosis: The encounter diagnosis was Malignant neoplasm of cervix, unspecified site (HCC) [C53.9].    Cancer Staging  Cervical cancer [facility]) Staging form: Cervix Uteri, AJCC Version 9 - Clinical stage from 12/24/2022: FIGO Stage IIIC2 (cT2b, cN2a, cM0) - Signed by Artis Delay, MD on 12/31/2022  Indication for treatment: Curative        Radiation treatment dates: 01/14/23 through 04/02/23 (Pelvic IMRT - 01/14/23 through 02/24/23) (Cervix Brachytherapy - 5 treatment sessions delivered delivered from 03/05/23 through 04/02/23)  Site/dose:    1) Pelvis - 45 Gy delivered in 25 Fx at 1.8 Gy/Fx 2) Pelvis boost - 9 Gy delivered in 5 Fx at 1.8 Gy/Fx 3) Cervix (brachytherapy) - 27.5 Gy delivered in 5 Fx at 5.5 Gy/Fx  Technique/Mode:  1&2) IMRT / Photon  3) HDR Ir-192 / Brachytherapy   Beams/energy:    1&2) 6X 3) GMP Ir-192 HDR  Narrative: The patient tolerated radiation treatment relatively well. During her final weekly treatment check for pelvic IMRT on 07/02, the patient endorsed fatigue, nausea, diarrhea, and dysuria. She otherwise tolerated cervical brachytherapy well other than some back pain related to a prior spinal fusion.   Plan: The patient has completed radiation treatment. The patient will return to radiation oncology clinic for routine followup in one month. I advised them to call or return sooner if they have any questions or concerns related to their recovery or treatment.  -----------------------------------  Billie Lade, PhD, MD  This document serves as a record of services personally performed by Antony Blackbird, MD. It was created on his behalf by Neena Rhymes, a trained medical scribe. The creation of this record is based on the scribe's personal observations and the provider's statements to  them. This document has been checked and approved by the attending provider.

## 2023-05-04 NOTE — Progress Notes (Signed)
Radiation Oncology         (336) [phone] ________________________________  Name: [name] MRN: [id number]  Date: 05/05/2023  DOB: 06/02/1951  Follow-Up Visit Note  CC: [name], MD  [name], MD  No diagnosis found.  Diagnosis:  The encounter diagnosis was Malignant neoplasm of cervix, unspecified site (HCC) [C53.9].    Cancer Staging  Cervical cancer [facility]) Staging form: Cervix Uteri, AJCC Version 9 - Clinical stage from 12/24/2022: FIGO Stage IIIC2 (cT2b, cN2a, cM0) - Signed by Artis Delay, MD on 12/31/2022  Interval Since Last Radiation: 1 month and 1 day   Indication for treatment: Curative       Radiation treatment dates: 01/14/23 through 04/02/23 (Pelvic IMRT - 01/14/23 through 02/24/23) (Cervix Brachytherapy - 5 treatment sessions delivered delivered from 03/05/23 through 04/02/23) Site/dose:    1) Pelvis - 45 Gy delivered in 25 Fx at 1.8 Gy/Fx 2) Pelvis boost - 9 Gy delivered in 5 Fx at 1.8 Gy/Fx 3) Cervix (brachytherapy) - 27.5 Gy delivered in 5 Fx at 5.5 Gy/Fx Technique/Mode:  1&2) IMRT / Photon  3) HDR Ir-192 / Brachytherapy  Beams/energy:    1&2) 6X 3) GMP Ir-192 HDR  Narrative:  The patient returns today for routine follow-up. The patient tolerated radiation treatment relatively well. During her final weekly treatment check for pelvic IMRT on 07/02, the patient endorsed fatigue, nausea, diarrhea, and dysuria. She otherwise tolerated cervical brachytherapy well other than some back pain related to a prior spinal fusion. She also presented to Dr. Pricilla Holm on 01/17/23 with intermittent heavy vaginal bleeding with passage of clots. No active bleeding was noted at that time, and the vagina was packed with quick clot gauze.   She however presented to the ED the following day (01/18/23) with ongoing vaginal bleeding and excessive fatigue. Labs in the ED were notable for Hemoglobin at 7.9 and she was given 1 unit PRBC and admitted for further work-up.  Dr. Pricilla Holm was consulted and recommended vaginal packing and foley catheter placement. Her bleeding and labs stabilized and she was discharged on 01/20/23 in stable condition.   Otherwise, no significant interval history since the patient completed radiation therapy.   ***  Allergies:  is allergic to amlodipine besylate, divalproex sodium, nortriptyline, hydromorphone hcl, other, rosuvastatin, and tramadol.  Meds: Current Outpatient Medications  Medication Sig Dispense Refill   acetaminophen (TYLENOL) 650 MG CR tablet Take 1,300 mg by mouth every 8 (eight) hours as needed for pain.     amoxicillin (AMOXIL) 500 MG capsule Take 4 capsules (2,000 mg total) by mouth as directed. 12 capsule 6   BIOTIN PO Take 1 tablet by mouth at bedtime.     Cholecalciferol (VITAMIN D) 125 MCG (5000 UT) CAPS Take 5,000 Units by mouth in the morning.     ferrous sulfate (FEROSUL) 325 (65 FE) MG tablet Take 1 tablet (325 mg total) by mouth daily. 100 tablet 0   fluticasone (FLONASE) 50 MCG/ACT nasal spray Place 1-2 sprays into both nostrils daily as needed for allergies or rhinitis.     furosemide (LASIX) 20 MG tablet Take 3 tablets (60 mg total) by mouth 2 (two) times daily. 540 tablet 3   gabapentin (NEURONTIN) 100 MG capsule TAKE 3 CAPSULES BY MOUTH IN THE MORNING AND 2 CAPSULES AT BEDTIME. OK TO TAKE EXTRA CAPSULE AT BEDTIME AS NEEDED 540 capsule 3   hydroxychloroquine (PLAQUENIL) 200 MG tablet Take 1 tablet (200 mg total) by mouth 2 (two) times daily with food or  milk 180 tablet 1   hydroxypropyl methylcellulose / hypromellose (ISOPTO TEARS / GONIOVISC) 2.5 % ophthalmic solution Place 1 drop into both eyes in the morning.     losartan (COZAAR) 100 MG tablet Take 1 tablet (100 mg total) by mouth daily. 90 tablet 1   Menthol, Topical Analgesic, (BIOFREEZE) 4 % GEL Apply 1 application  topically at bedtime. Neuropathy feet/ankles     metoprolol succinate (TOPROL-XL) 50 MG 24 hr tablet Take 1 tablet (50 mg total)  by mouth 2 (two) times daily. 270 tablet 1   Multiple Vitamins-Minerals (MULTIVITAMIN WITH MINERALS) tablet Take 2 tablets by mouth daily.     oxyCODONE-acetaminophen (PERCOCET/ROXICET) 5-325 MG tablet Take 1 tablet by mouth every 6 (six) hours as needed for severe pain. 30 tablet 0   potassium chloride (KLOR-CON) 10 MEQ tablet Take 3 tablets (30 mEq total) by mouth 2 (two) times daily. 540 tablet 3   potassium chloride SA (KLOR-CON M) 20 MEQ tablet Take 1 tablet by mouth 2 times daily. 180 tablet 1   prochlorperazine (COMPAZINE) 10 MG tablet Take 1 tablet (10 mg) by mouth every 6 hours as needed for nausea or vomiting. 30 tablet 0   sulfaSALAzine (AZULFIDINE) 500 MG tablet Take 2 tablets (1,000 mg total) by mouth 2 (two) times daily. 120 tablet 2   sulfaSALAzine (AZULFIDINE) 500 MG tablet Take 2 tablets (1,000 mg total) by mouth 2 (two) times daily. 360 tablet 1   vitamin C (ASCORBIC ACID) 500 MG tablet Take 500 mg by mouth daily.     No current facility-administered medications for this encounter.    Physical Findings: The patient is in no acute distress. Patient is alert and oriented.  vitals were not taken for this visit. .  No significant changes. Lungs are clear to auscultation bilaterally. Heart has regular rate and rhythm. No palpable cervical, supraclavicular, or axillary adenopathy. Abdomen soft, non-tender, normal bowel sounds.   Lab Findings: Lab Results  Component Value Date   WBC 4.1 03/05/2023   HGB 11.7 (L) 03/05/2023   HCT 34.1 (L) 03/05/2023   MCV 89.7 03/05/2023   PLT 154 03/05/2023    Radiographic Findings: No results found.  Impression:  The encounter diagnosis was Malignant neoplasm of cervix, unspecified site (HCC) [C53.9].   The patient is recovering from the effects of radiation.  ***  Plan:  ***   *** minutes of total time was spent for this patient encounter, including preparation, face-to-face counseling with the patient and coordination of care,  physical exam, and documentation of the encounter. ____________________________________  Billie Lade, PhD, MD  This document serves as a record of services personally performed by Antony Blackbird, MD. It was created on his behalf by Neena Rhymes, a trained medical scribe. The creation of this record is based on the scribe's personal observations and the provider's statements to them. This document has been checked and approved by the attending provider.

## 2023-05-05 ENCOUNTER — Encounter: Payer: Self-pay | Admitting: Radiation Oncology

## 2023-05-05 ENCOUNTER — Ambulatory Visit
Admission: RE | Admit: 2023-05-05 | Discharge: 2023-05-05 | Disposition: A | Discharge status: Home / Self Care | Payer: Medicare Other | Source: Ambulatory Visit | Attending: Radiation Oncology | Admitting: Radiation Oncology

## 2023-05-05 VITALS — BP 99/50 | HR 75 | Temp 97.5°F | Resp 20 | Ht 66.0 in | Wt 269.4 lb

## 2023-05-05 DIAGNOSIS — C539 Malignant neoplasm of cervix uteri, unspecified: Secondary | ICD-10-CM | POA: Insufficient documentation

## 2023-05-05 DIAGNOSIS — C538 Malignant neoplasm of overlapping sites of cervix uteri: Secondary | ICD-10-CM

## 2023-05-05 DIAGNOSIS — Z923 Personal history of irradiation: Secondary | ICD-10-CM | POA: Insufficient documentation

## 2023-05-05 HISTORY — DX: Personal history of irradiation: Z92.3

## 2023-05-05 NOTE — Progress Notes (Signed)
[name] is here today for follow up post radiation to the pelvic.  They completed their radiation on: 04/02/23   Does the patient complain of any of the following:  Pain: No Abdominal bloating: Yes Diarrhea/Constipation: No Nausea/Vomiting: Nausea Vaginal Discharge: N Blood in Urine or Stool: No  Urinary Issues (dysuria/incomplete emptying/ incontinence/ increased frequency/urgency): Bladder leakage, reports burning inside vagina.  Does patient report using vaginal dilator 2-3 times a week and/or sexually active 2-3 weeks:  Patient given S+ and M vaginal dilators with instructions and importance of use. Patient voiced understanding.  Post radiation skin changes: no   Additional comments if applicable:    BP (!) 99/50 (BP Location: Right Arm, Patient Position: Sitting, Cuff Size: Large)   Pulse 75   Temp (!) 97.5 F (36.4 C)   Resp 20   Ht 5\' 6"  (1.676 m)   Wt 269 lb 6.4 oz (122.2 kg)   SpO2 95%   BMI 43.48 kg/m

## 2023-05-06 ENCOUNTER — Other Ambulatory Visit: Payer: Self-pay

## 2023-05-06 ENCOUNTER — Other Ambulatory Visit (HOSPITAL_COMMUNITY): Payer: Self-pay

## 2023-05-07 ENCOUNTER — Other Ambulatory Visit (HOSPITAL_COMMUNITY): Payer: Self-pay

## 2023-05-20 ENCOUNTER — Other Ambulatory Visit: Payer: Self-pay

## 2023-05-21 NOTE — Therapy (Signed)
OUTPATIENT PHYSICAL THERAPY  LOWER EXTREMITY ONCOLOGY EVALUATION  Patient Name: [name] MRN: [id number] DOB:07/11/1951, 72 y.o., female Today's Date: 05/23/2023  END OF SESSION:  PT End of Session - 05/23/23 1024     Visit Number 1    Number of Visits 5    Date for PT Re-Evaluation 07/04/23    Authorization Type needs auth    PT Start Time 1605    PT Stop Time 1700    PT Time Calculation (min) 55 min    Activity Tolerance Patient tolerated treatment well    Behavior During Therapy [facility] for tasks assessed/performed             Past Medical History:  Diagnosis Date   Allergy    Cataract    BILATERAL-REMOVED 2 YEARS AGO   Cervical cancer (HCC) 2024   COVID 09/2021   Encounter for blood transfusion 01/19/2023   2 units given   Erythromelalgia (HCC)    followed by neuro Dr Allena Katz , mgd on gabapentin , dx several years ago    GERD (gastroesophageal reflux disease)    Helicobacter pylori gastritis 10/05/2018   History of atrial fibrillation 09/2022   s/p watchman implantation   History of radiation therapy    Cervix- 01/14/23-04/02/23-Dr. 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 Lalla Brothers   Pseudogout of foot    Rheumatoid arthritis(714.0) dx 2010   Shingles    hx of    Tibial plateau fracture, right, closed, initial encounter 07/16/2017   Past Surgical History:  Procedure Laterality Date   ATRIAL FIBRILLATION ABLATION  N/A 04/16/2022   Procedure: ATRIAL FIBRILLATION ABLATION;  Surgeon: Lanier Prude, MD;  Location: [facility] INVASIVE CV LAB;  Service: Cardiovascular;  Laterality: N/A;   BREAST BIOPSY  1972   Broken wrist Bilateral 2010   CARDIOVERSION N/A 11/22/2021   Procedure: CARDIOVERSION;  Surgeon: Sande Rives, MD;  Location: [facility] ENDOSCOPY;  Service: Cardiovascular;  Laterality: N/A;   CATARACT EXTRACTION Bilateral 03/2012   COLONOSCOPY     COSMETIC SURGERY     breast   FRACTURE SURGERY     INNER EAR SURGERY     busted ear drum   LEFT ATRIAL APPENDAGE OCCLUSION N/A 10/03/2022   Procedure: LEFT ATRIAL APPENDAGE OCCLUSION;  Surgeon: Lanier Prude, MD;  Location: [facility] INVASIVE CV LAB;  Service: Cardiovascular;  Laterality: N/A;   MAXIMUM ACCESS (MAS)POSTERIOR LUMBAR INTERBODY FUSION (PLIF) 1 LEVEL N/A 10/11/2016   Procedure: Lumbar one-Sacral one Maximum access posterior lumbar interbody fusion;  Surgeon: Maeola Harman, MD;  Location: [facility] OR;  Service: Neurosurgery;  Laterality: N/A;   OPERATIVE ULTRASOUND N/A 03/05/2023   Procedure: OPERATIVE ULTRASOUND;  Surgeon: Antony Blackbird, MD;  Location: [facility];  Service: Urology;  Laterality: N/A;   OPERATIVE ULTRASOUND N/A 03/13/2023   Procedure: OPERATIVE  ULTRASOUND;  Surgeon: Antony Blackbird, MD;  Location: [facility];  Service: Urology;  Laterality: N/A;   OPERATIVE ULTRASOUND N/A 03/20/2023   Procedure: OPERATIVE ULTRASOUND;  Surgeon: Antony Blackbird, MD;  Location: [facility];  Service: Urology;  Laterality: N/A;   OPERATIVE ULTRASOUND N/A 03/26/2023   Procedure: OPERATIVE ULTRASOUND;  Surgeon: Antony Blackbird, MD;  Location: [facility];  Service: Urology;  Laterality: N/A;   OPERATIVE ULTRASOUND N/A 04/02/2023   Procedure: OPERATIVE ULTRASOUND;  Surgeon: Antony Blackbird, MD;  Location: [facility];  Service: Urology;  Laterality: N/A;   ORIF WRIST FRACTURE Right 07/18/2017    Procedure: OPEN REDUCTION INTERNAL FIXATION (ORIF) WRIST FRACTURE;  Surgeon: Sheral Apley, MD;  Location: [facility] OR;  Service: Orthopedics;  Laterality: Right;   PARATHYROIDECTOMY Left 04/12/2019   Procedure: LEFT SUPERIOR PARATHYROIDECTOMY;  Surgeon: Darnell Level, MD;  Location: [facility] ORS;  Service: General;  Laterality: Left;   TANDEM RING INSERTION N/A 03/05/2023   Procedure: TANDEM RING INSERTION;  Surgeon: Antony Blackbird, MD;  Location: [facility];  Service: Urology;  Laterality: N/A;   TANDEM RING INSERTION N/A 03/13/2023   Procedure: TANDEM RING INSERTION;  Surgeon: Antony Blackbird, MD;  Location: [facility];  Service: Urology;  Laterality: N/A;   TANDEM RING INSERTION N/A 03/20/2023   Procedure: TANDEM RING INSERTION;  Surgeon: Antony Blackbird, MD;  Location: [facility];  Service: Urology;  Laterality: N/A;   TANDEM RING INSERTION N/A 03/26/2023   Procedure: TANDEM RING INSERTION;  Surgeon: Antony Blackbird, MD;  Location: [facility];  Service: Urology;  Laterality: N/A;   TANDEM RING INSERTION N/A 04/02/2023   Procedure: TANDEM RING INSERTION;  Surgeon: Antony Blackbird, MD;  Location: [facility];  Service: Urology;  Laterality: N/A;   TEE WITHOUT CARDIOVERSION N/A 10/03/2022   Procedure: TRANSESOPHAGEAL ECHOCARDIOGRAM (TEE);  Surgeon: Lanier Prude, MD;  Location: [facility] INVASIVE CV LAB;  Service: Cardiovascular;  Laterality: N/A;   watchman implantation  10/03/2022   Patient Active Problem List   Diagnosis Date Noted   Symptomatic anemia 01/19/2023   Vaginal bleeding 01/18/2023   Cervical cancer (HCC) 12/24/2022   Physical debility 12/24/2022   Presence of Watchman left atrial appendage closure device 10/03/2022   Atrial fibrillation (HCC) 04/01/2022   Snoring 01/02/2022   Lung nodule 10/11/2021   COVID-19 virus infection 10/10/2021   Prolonged QT interval 10/10/2021   Multiple thyroid nodules 04/11/2019    Helicobacter pylori gastritis 10/05/2018   Obesity, Class III, BMI 40-49.9 (morbid obesity) (HCC) 05/12/2018   Hyperglycemia 05/12/2018   Lower extremity edema -chronic 05/12/2018   Iron deficiency anemia 05/12/2018   Erythromelalgia (HCC) 10/17/2017   Hyperparathyroidism (HCC) 05/06/2017   Lumbar stenosis with neurogenic claudication 10/11/2016   H/O breast augmentation 09/12/2016   L-S radiculopathy 05/30/2016   Neck mass 05/30/2015   Hx of colonic polyp - ssp 11/03/2014   Idiopathic neuropathy 07/06/2014   Hyperlipidemia 06/02/2014   Osteoporosis 10/17/2011   Rheumatoid arthritis (HCC)    Hypertension     PCP: Nira Conn, MD  REFERRING PROVIDER: Antony Blackbird, MD  REFERRING DIAG:  Diagnosis  C53.8 (ICD-10-CM) - Malignant neoplasm of overlapping sites of cervix (HCC)  Lymphedema  THERAPY DIAG:  Erythromelalgia (HCC) - Plan: PT plan of care cert/re-cert  Malignant neoplasm of overlapping sites of cervix (HCC) - Plan: PT plan of care cert/re-cert  Lymphedema, not elsewhere classified - Plan: PT plan of care cert/re-cert  ONSET DATE: 12/24/22  Rationale for Evaluation and Treatment: Rehabilitation  SUBJECTIVE:                                                                                                                                                                                           SUBJECTIVE STATEMENT: I have lymphedema in both legs.  I think the Lt leg is the worst.  Wounds are all healed.  I have knee high compression stockings.  They seem to contain.  When they used the compression pumps in the hospital I felt like my legs were looking much better.    PERTINENT HISTORY: Cervical cancer stage 3, Completed 5 brachytherapy /radiation treatments on 04/02/23. Chemo was withheld due to baseline neuropathy. Other hx: back pain and spinal fusion. *Erythromelalgia (HCC). (a rare syndrome in which small arteries (arterioles) of the skin dilate periodically, causing a  burning pain, making the skin feel hot, and making the feet and, less often, the hands turn red.) Afib, good kidneys no CHF, normal ABI  PAIN:  Are you having pain? Yes NPRS scale: 5-6/10 Pain location: low back, legs, feet and ankles Pain orientation: Bilateral  PAIN TYPE: aching, burning, and throbbing Pain description: constant  Aggravating factors: when they get swollen or have episodes of the erythromelagia  Relieving factors: ice or cold water buckets   PRECAUTIONS: Fall, bil LE lymphedema   RED FLAGS: None   WEIGHT BEARING RESTRICTIONS: No  FALLS:  Has patient fallen in last 6 months? No  LIVING ENVIRONMENT: Lives with: lives with their family and lives with their spouse Lives in: House/apartment Has following equipment at home: Environmental consultant - 4 wheeled and Wheelchair (manual)  OCCUPATION: not working  PRIOR LEVEL OF FUNCTION: Independent with community mobility with device and Needs assistance with ADLs  PATIENT GOALS: see if I can get a pump    OBJECTIVE: Note: Objective measures were completed at Evaluation unless otherwise noted.  COGNITION: Overall cognitive status: Within functional limits for tasks assessed   PALPATION: +1 pitting from dorsum of foot to knee bil, feet cold with very fast refill,   OBSERVATIONS / OTHER ASSESSMENTS: feet red to purple which is noted to be the erythromelalgia and has been checked.    SENSATION: Neuropathy bil feet and lower legs   POSTURE: seated in WC  LYMPHEDEMA ASSESSMENTS:   LOWER EXTREMITY LANDMARK RIGHT Eval -  in seated  At groin   30 cm proximal to suprapatella   20 cm proximal to suprapatella   10 cm proximal to suprapatella 62  At midpatella / popliteal crease 50.2  30 cm proximal to floor at lateral plantar  foot 46  20 cm proximal to floor at lateral plantar foot 35.2  10 cm proximal to floor at lateral plantar foot 27.6  Circumference of ankle/heel 35.7  5 cm proximal to 1st MTP joint 25.7  Across MTP  joint 25.6  Around proximal great toe 10.  (Blank rows = not tested)  LOWER EXTREMITY LANDMARK LEFT Eval - in seated  At groin   30 cm proximal to suprapatella   20 cm proximal to suprapatella   10 cm proximal to suprapatella 59  At midpatella / popliteal crease 51  30 cm proximal to floor at lateral plantar foot 48.5  20 cm proximal to floor at lateral plantar foot 33.5  10 cm proximal to floor at lateral plantar foot 27  Circumference of ankle/heel   5 cm proximal to 1st MTP joint 25.7  Across MTP joint 25.8  Around proximal great toe 10  (Blank rows = not tested)  GAIT: Distance walked: arrives to [facility] , able to walk from [facility] to treatment table ind  Outcome measure: LLIS: 52.9%   TODAY'S TREATMENT:                                                                                                                                          DATE: 05/22/23 Discussed POC and treatment options Pt worried about wrapping due to erthromelalgia  Showed velcro and different compression socks.  Pt currently orders from [facility] and reports they seem to work well, but we may need to get a fit more containment Interested in pump so showed pt pump options.   Discussed principles of CDT and demonstrated remedial exercises: seated march, LAQ, ankle pumps, deep breathing, walking   PATIENT EDUCATION:  Education details: per today's note Person educated: Patient and Spouse Education method: Medical illustrator Education comprehension: verbalized understanding  HOME EXERCISE PROGRAM: remedial exercises: seated march, LAQ, ankle pumps, deep breathing, walking   ASSESSMENT:  CLINICAL IMPRESSION: Patient is a 72 y.o. female who was seen today for physical therapy evaluation and treatment for her bil LE chronic lymphedema.  She had compression on the calves in the hospital for radiation and noted that her legs looked much better so she would like to try a lymphedema pump for assistance in  self care.  Pt has a hx of open wounds at the Left toes but are all healed currently.  Coloring of feet appears like PAD but pt has been checked for this and diagnosed with a rare erythromelalgia which is responsible for the  coloring and burning. Pt is happy with her current circular knit stockings but was educated about Veterinary surgeon in [place] for other cost effective options.  Denies pelvic or genital swelling at this time.    OBJECTIVE IMPAIRMENTS: decreased activity tolerance, decreased balance, decreased endurance, decreased knowledge of condition, decreased knowledge of use of DME, decreased mobility, and increased edema.  ACTIVITY LIMITATIONS: standing, dressing, and locomotion level  PARTICIPATION LIMITATIONS: cleaning, laundry, and community activity  PERSONAL FACTORS: Age, Time since onset of injury/illness/exacerbation, and 1-2 comorbidities: , CVI, radiation hx  are also affecting patient's functional outcome.   REHAB POTENTIAL: Good  CLINICAL DECISION MAKING: Evolving/moderate complexity  EVALUATION COMPLEXITY: Moderate   GOALS: Goals reviewed with patient? Yes  SHORT TERM GOALS: Target date: 06/05/23  Pt will be educated on CDT principles and options as well as lymphedema self care Baseline: Goal status: INITIAL   LONG TERM GOALS: Target date: 07/04/23  Pt will be ind with vasopneumatic pump use for chronic lymphedema  Baseline:  Goal status: INITIAL   PLAN:  PT FREQUENCY: 1x/week  PT DURATION: 5 weeks   PLANNED INTERVENTIONS: Therapeutic exercises, Patient/Family education, Self Care, Manual therapy, and Re-evaluation  PLAN FOR NEXT SESSION: bil LE MLD and remedial exercise Pump demo sent 05/23/23  DIAGNOSIS ASSESSMENT [x]  Lymphedema, not elsewhere classified [I89.0]  Secondary to:  []  Hereditary/Congenital lymphedema [Q82.0]  []  Praecox []  Tarda []  Other:   LOCATION OF LYMPHEDEMA [x]   Right Leg  [x]  Left Leg  []   Pelvis/Genitals/Hips/Buttocks  []  Abdomen/Trunk  []  Other:  Date of onset, or duration:  2019  SEVERITY Symptoms and observations from physical exam Stage of Lymphedema:  [x] Stage II  []  Stage III  [] Other: []  Hyperkeratosis []  Hyperpigmentation []  Lymphorrhea (weeping) []  Papillomatosis (warts, nodules, papules)  []  Elephantiasis  []  Fibrosis [x]  Pitting edema []  Recurrent episode(s) of infection/cellulitis [x]  Pain [x]  History of Venous leg ulcers and/or wounds [x]  Functional impairments to include limited range of motion or impaired mobility []  Other, explain:   CONSERVATIVE TREATMENT (CT) INITATION Start date (if different from this visit date): 05/22/23  Conservative treatment plan includes the following activities (check all that apply): [x]  Regular exercise  [x]  Elevation of the limb(s) [x]  Compression garment(s) or compression bandage system  []  Compression garments with a minimum of     20-30        mmHg distally  []  Compression bandage system  Type: []  Multi-layer short stretch []  Velcro compression wrap(s)    []  One-time use pre-packaged bandaging (i.e., Profore, Unna boot, Coban)  []  Other:  [x]  Complete decongestive therapy (CDT) or lymphedema therapy   [x]  Minimum of 30 minutes daily MLD or self-MLD  []  Diet assessment with modification recommendations []  Other:   Has the patient tried an [id number] basic pneumatic compression device? []  Yes  [x]  No Comments:    MEASUREMENTS Choose at least two anatomical locations. See above  Right Leg    Left Leg     Ankle                     cm  Ankle                     cm  Calf                     cm  Calf                     cm  Knee                     cm  Knee                     cm  Thigh  cm  Thigh                     cm  Suprapubic (Hip)*                     cm  Suprapubic (Hip)*                     cm  Umbilicus (Waist)*                     cm  Umbilicus (Waist)*                     cm  *Include  measurements if patient presents with lymphedema proximally into the genitals or trunk    Mertha Clyatt, Julieanne Manson, PT 05/23/2023, 10:46 AM

## 2023-05-22 ENCOUNTER — Ambulatory Visit: Payer: Medicare Other | Attending: Radiation Oncology | Admitting: Rehabilitation

## 2023-05-22 ENCOUNTER — Other Ambulatory Visit (HOSPITAL_COMMUNITY): Payer: Self-pay

## 2023-05-22 DIAGNOSIS — C538 Malignant neoplasm of overlapping sites of cervix uteri: Secondary | ICD-10-CM | POA: Insufficient documentation

## 2023-05-22 DIAGNOSIS — I7381 Erythromelalgia: Secondary | ICD-10-CM | POA: Diagnosis not present

## 2023-05-22 DIAGNOSIS — I890 Lymphedema, not elsewhere classified: Secondary | ICD-10-CM | POA: Insufficient documentation

## 2023-05-23 ENCOUNTER — Other Ambulatory Visit: Payer: Self-pay

## 2023-05-23 ENCOUNTER — Other Ambulatory Visit (HOSPITAL_COMMUNITY): Payer: Self-pay

## 2023-05-23 ENCOUNTER — Encounter: Payer: Self-pay | Admitting: Rehabilitation

## 2023-06-03 NOTE — Telephone Encounter (Signed)
Error in charting.

## 2023-06-04 ENCOUNTER — Telehealth: Payer: Self-pay

## 2023-06-04 NOTE — Telephone Encounter (Signed)
Patient called in to ask if it was ok to have two teeth extracted after receiving radiation. Dentist need clearance letter prior to extraction. Patient also asking if she can dye her hair. Please advise.

## 2023-06-05 ENCOUNTER — Ambulatory Visit: Payer: Medicare Other | Admitting: Rehabilitation

## 2023-06-05 ENCOUNTER — Encounter: Payer: Self-pay | Admitting: Rehabilitation

## 2023-06-05 DIAGNOSIS — C538 Malignant neoplasm of overlapping sites of cervix uteri: Secondary | ICD-10-CM | POA: Diagnosis not present

## 2023-06-05 DIAGNOSIS — I890 Lymphedema, not elsewhere classified: Secondary | ICD-10-CM

## 2023-06-05 DIAGNOSIS — I7381 Erythromelalgia: Secondary | ICD-10-CM | POA: Diagnosis not present

## 2023-06-05 NOTE — Therapy (Signed)
OUTPATIENT PHYSICAL THERAPY  LOWER EXTREMITY ONCOLOGY TREATMENT  Patient Name: [name] MRN: [id number] DOB:Oct 15, 1950, 72 y.o., female Today's Date: 06/05/2023  END OF SESSION:  PT End of Session - 06/05/23 1725     Visit Number 2    Number of Visits 5    Date for PT Re-Evaluation 07/04/23    Authorization Type [employer] authorized 5 visits 05/22/23-07/03/23    Authorization - Visit Number 2    Authorization - Number of Visits 5    PT Start Time 1500    PT Stop Time 1553    PT Time Calculation (min) 53 min    Activity Tolerance Patient tolerated treatment well    Behavior During Therapy [facility] for tasks assessed/performed              Past Medical History:  Diagnosis Date   Allergy    Cataract    BILATERAL-REMOVED 2 YEARS AGO   Cervical cancer (HCC) 2024   COVID 09/2021   Encounter for blood transfusion 01/19/2023   2 units given   Erythromelalgia (HCC)    followed by neuro Dr Allena Katz , mgd on gabapentin , dx several years ago    GERD (gastroesophageal reflux disease)    Helicobacter pylori gastritis 10/05/2018   History of atrial fibrillation 09/2022   s/p watchman implantation   History of radiation therapy    Cervix- 01/14/23-04/02/23-Dr. 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 Lalla Brothers   Pseudogout of foot    Rheumatoid arthritis(714.0) dx 2010   Shingles    hx of    Tibial plateau fracture, right, closed, initial  encounter 07/16/2017   Past Surgical History:  Procedure Laterality Date   ATRIAL FIBRILLATION ABLATION N/A 04/16/2022   Procedure: ATRIAL FIBRILLATION ABLATION;  Surgeon: Lanier Prude, MD;  Location: [facility] IN[facility]IVE [facility];  Service: Cardiovascular;  Laterality: N/A;   BREAST BIOPSY  1972   Broken wrist Bilateral 2010   CARDIOVERSION N/A 11/22/2021   Procedure: CARDIOVERSION;  Surgeon: Sande Rives, MD;  Location: [facility] ENDOSCOPY;  Service: Cardiovascular;  Laterality: N/A;   CATARACT EXTRACTION Bilateral 03/2012   COLONOSCOPY     COSMETIC SURGERY     breast   FRACTURE SURGERY     INNER EAR SURGERY     busted ear drum   LEFT ATRIAL APPENDAGE OCCLUSION N/A 10/03/2022   Procedure: LEFT ATRIAL APPENDAGE OCCLUSION;  Surgeon: Lanier Prude, MD;  Location: [facility] INV[facility] CV [facility];  Service: Cardiovascular;  Laterality: N/A;   MAXIMUM ACCESS (MAS)POSTERIOR LUMBAR INTERBODY FUSION (PLIF) 1 LEVEL N/A 10/11/2016   Procedure: Lumbar one-Sacral one Maximum access posterior lumbar interbody fusion;  Surgeon: Maeola Harman, MD;  Location: [facility] OR;  Service: Neurosurgery;  Laterality: N/A;   OPERATIVE ULTRASOUND N/A 03/05/2023   Procedure: OPERATIVE ULTRASOUND;  Surgeon: Antony Blackbird, MD;  Location: [facility];  Service: Urology;  Laterality: N/A;   OPERATIVE ULTRASOUND N/A 03/13/2023   Procedure: OPERATIVE ULTRASOUND;  Surgeon: Antony Blackbird, MD;  Location: [facility];  Service: Urology;  Laterality: N/A;   OPERATIVE ULTRASOUND N/A 03/20/2023   Procedure: OPERATIVE ULTRASOUND;  Surgeon: Antony Blackbird, MD;  Location: [facility];  Service: Urology;  Laterality: N/A;   OPERATIVE ULTRASOUND N/A 03/26/2023   Procedure: OPERATIVE ULTRASOUND;  Surgeon: Antony Blackbird, MD;  Location: [facility];  Service: Urology;  Laterality: N/A;   OPERATIVE ULTRASOUND N/A 04/02/2023   Procedure: OPERATIVE ULTRASOUND;  Surgeon: Antony Blackbird, MD;  Location:  [facility];  Service: Urology;  Laterality: N/A;   ORIF WRIST FRACTURE Right 07/18/2017   Procedure: OPEN REDUCTION INTERNAL FIXATION (ORIF) WRIST FRACTURE;  Surgeon: Sheral Apley, MD;  Location: [facility] OR;  Service: Orthopedics;  Laterality: Right;   PARATHYROIDECTOMY Left 04/12/2019   Procedure: LEFT SUPERIOR PARATHYROIDECTOMY;  Surgeon: Darnell Level, MD;  Location: [facility] ORS;  Service: General;  Laterality: Left;   TANDEM RING INSERTION N/A 03/05/2023   Procedure: TANDEM RING INSERTION;  Surgeon: Antony Blackbird, MD;  Location: [facility];  Service: Urology;  Laterality: N/A;   TANDEM RING INSERTION N/A 03/13/2023   Procedure: TANDEM RING INSERTION;  Surgeon: Antony Blackbird, MD;  Location: [facility];  Service: Urology;  Laterality: N/A;   TANDEM RING INSERTION N/A 03/20/2023   Procedure: TANDEM RING INSERTION;  Surgeon: Antony Blackbird, MD;  Location: [facility];  Service: Urology;  Laterality: N/A;   TANDEM RING INSERTION N/A 03/26/2023   Procedure: TANDEM RING INSERTION;  Surgeon: Antony Blackbird, MD;  Location: [facility];  Service: Urology;  Laterality: N/A;   TANDEM RING INSERTION N/A 04/02/2023   Procedure: TANDEM RING INSERTION;  Surgeon: Antony Blackbird, MD;  Location: [facility];  Service: Urology;  Laterality: N/A;   TEE WITHOUT CARDIOVERSION N/A 10/03/2022   Procedure: TRANSESOPHAGEAL ECHOCARDIOGRAM (TEE);  Surgeon: Lanier Prude, MD;  Location: [facility] INVASIVE CV LAB;  Service: Cardiovascular;  Laterality: N/A;   watchman implantation  10/03/2022   Patient Active Problem List   Diagnosis Date Noted   Symptomatic anemia 01/19/2023   Vaginal bleeding 01/18/2023   Cervical cancer (HCC) 12/24/2022   Physical debility 12/24/2022   Presence of Watchman left atrial appendage closure device 10/03/2022   Atrial fibrillation (HCC) 04/01/2022   Snoring 01/02/2022   Lung nodule 10/11/2021   COVID-19 virus  infection 10/10/2021   Prolonged QT interval 10/10/2021   Multiple thyroid nodules 04/11/2019   Helicobacter pylori gastritis 10/05/2018   Obesity, Class III, BMI 40-49.9 (morbid obesity) (HCC) 05/12/2018   Hyperglycemia 05/12/2018   Lower extremity edema -chronic 05/12/2018   Iron deficiency anemia 05/12/2018   Erythromelalgia (HCC) 10/17/2017   Hyperparathyroidism (HCC) 05/06/2017   Lumbar stenosis with neurogenic claudication 10/11/2016   H/O breast augmentation 09/12/2016   L-S radiculopathy 05/30/2016   Neck mass 05/30/2015   Hx of colonic polyp - ssp 11/03/2014   Idiopathic neuropathy 07/06/2014   Hyperlipidemia 06/02/2014   Osteoporosis 10/17/2011   Rheumatoid arthritis (HCC)    Hypertension     PCP: Nira Conn, MD  REFERRING PROVIDER: Antony Blackbird, MD  REFERRING DIAG:  Diagnosis  C53.8 (ICD-10-CM) - Malignant neoplasm of overlapping sites of cervix (HCC)  Lymphedema  THERAPY DIAG:  Erythromelalgia (HCC)  Malignant neoplasm of overlapping sites of cervix (HCC)  Lymphedema, not elsewhere classified  ONSET DATE: 12/24/22  Rationale for Evaluation and Treatment: Rehabilitation  SUBJECTIVE:                                                                                                                                                                                           SUBJECTIVE STATEMENT:  I really have Rt knee pain today.  They are both feeling swollen.  The pump people are coming out already on Monday.    EVAL: I have lymphedema in both legs.  I think the Lt leg is the worst.  Wounds are all healed.  I have knee high compression stockings.  They seem to contain.  When they used the compression pumps in the hospital I felt like my legs were looking much better.    PERTINENT HISTORY: Cervical cancer stage 3, Completed 5 brachytherapy /radiation treatments on 04/02/23. Chemo was withheld due to baseline neuropathy. Other hx: back pain and spinal fusion.  *Erythromelalgia (HCC). (a rare syndrome in which small arteries (arterioles) of the skin dilate periodically, causing a burning pain, making the skin feel hot, and making the feet and, less often, the hands turn red.) Afib, good kidneys no CHF, normal ABI  PAIN:  Are you having pain? Yes NPRS scale: 5-6/10 Pain location:  feet and ankles Pain orientation: Bilateral  PAIN TYPE: aching, burning, and throbbing Pain description: constant  Aggravating factors: when they get swollen or have episodes of the erythromelagia  Relieving factors: ice or cold water buckets   PRECAUTIONS: Fall, bil LE lymphedema   RED FLAGS: None   WEIGHT BEARING RESTRICTIONS: No  FALLS:  Has patient fallen in last 6 months? No  LIVING ENVIRONMENT: Lives with: lives with their family and lives with their spouse Lives in: House/apartment Has following equipment at home: Environmental consultant - 4 wheeled and Wheelchair (manual)  OCCUPATION: not working  PRIOR LEVEL OF FUNCTION: Independent with community mobility with device and Needs assistance with ADLs  PATIENT GOALS: see if I can get a pump    OBJECTIVE: Note: Objective measures were completed at Evaluation unless otherwise noted.  COGNITION: Overall cognitive status: Within functional limits for tasks assessed   PALPATION: +1 pitting from dorsum of foot to knee bil, feet cold with very fast refill,   OBSERVATIONS / OTHER ASSESSMENTS: feet red to purple which is noted to be the erythromelalgia and has been checked.    SENSATION: Neuropathy bil feet and lower legs   POSTURE: seated in WC  LYMPHEDEMA ASSESSMENTS:   LOWER EXTREMITY LANDMARK RIGHT Eval -  in seated  At groin   30 cm proximal to suprapatella   20 cm proximal  to suprapatella   10 cm proximal to suprapatella 62  At midpatella / popliteal crease 50.2  30 cm proximal to floor at lateral plantar foot 46  20 cm proximal to floor at lateral plantar foot 35.2  10 cm proximal to floor at lateral  plantar foot 27.6  Circumference of ankle/heel 35.7  5 cm proximal to 1st MTP joint 25.7  Across MTP joint 25.6  Around proximal great toe 10.  (Blank rows = not tested)  LOWER EXTREMITY LANDMARK LEFT Eval - in seated  At groin   30 cm proximal to suprapatella   20 cm proximal to suprapatella   10 cm proximal to suprapatella 59  At midpatella / popliteal crease 51  30 cm proximal to floor at lateral plantar foot 48.5  20 cm proximal to floor at lateral plantar foot 33.5  10 cm proximal to floor at lateral plantar foot 27  Circumference of ankle/heel   5 cm proximal to 1st MTP joint 25.7  Across MTP joint 25.8  Around proximal great toe 10  (Blank rows = not tested)  GAIT: Distance walked: arrives to [facility] , able to walk from [facility] to treatment table ind  Outcome measure: LLIS: 52.9%   TODAY'S TREATMENT:                                                                                                                                          DATE:  06/05/23 Supine with legs on larger bolster under knees and smaller foam roll under ankles Short neck, axillary nodes, inguinal nodes, superficial abdominals and then breathing without resistance due to recent radiation of the pelvic region, then inguino-axillary anastamosis, then lower extremity from proximal to distal educating patient on home steps we will include, then husband came in for the opposite side and was educated on skin stretch, pressure, and technique to help with pt's lower legs as he is able.  Then performed on the opposite leg.   See instruction section for today for steps given.    05/22/23 Discussed POC and treatment options Pt worried about wrapping due to erthromelalgia  Showed velcro and different compression socks.  Pt currently orders from [facility] and reports they seem to work well, but we may need to get a fit more containment Interested in pump so showed pt pump options.   Discussed principles of CDT and  demonstrated remedial exercises: seated march, LAQ, ankle pumps, deep breathing, walking   PATIENT EDUCATION:  Education details: per today's note Person educated: Patient and Spouse Education method: Medical illustrator Education comprehension: verbalized understanding  HOME EXERCISE PROGRAM: remedial exercises: seated march, LAQ, ankle pumps, deep breathing, walking  Self MLD modified: short neck, axillary and inguinal nodes, breathing, then thigh work   ASSESSMENT:  CLINICAL IMPRESSION: Pt was educated on self MLD today and performance on bil legs.  She has decreased skin stretch bil  LE's due to chronic lymphedema but no significant fibrosis noted.  Pt tolerated MLD well even with her lower leg condition and her husband is very willing to help.   OBJECTIVE IMPAIRMENTS: decreased activity tolerance, decreased balance, decreased endurance, decreased knowledge of condition, decreased knowledge of use of DME, decreased mobility, and increased edema.   ACTIVITY LIMITATIONS: standing, dressing, and locomotion level  PARTICIPATION LIMITATIONS: cleaning, laundry, and community activity  PERSONAL FACTORS: Age, Time since onset of injury/illness/exacerbation, and 1-2 comorbidities: , CVI, radiation hx  are also affecting patient's functional outcome.   REHAB POTENTIAL: Good  CLINICAL DECISION MAKING: Evolving/moderate complexity  EVALUATION COMPLEXITY: Moderate   GOALS: Goals reviewed with patient? Yes  SHORT TERM GOALS: Target date: 06/05/23  Pt will be educated on CDT principles and options as well as lymphedema self care Baseline: Goal status: INITIAL   LONG TERM GOALS: Target date: 07/04/23  Pt will be ind with vasopneumatic pump use for chronic lymphedema  Baseline:  Goal status: INITIAL   PLAN:  PT FREQUENCY: 1x/week  PT DURATION: 5 weeks   PLANNED INTERVENTIONS: Therapeutic exercises, Patient/Family education, Self Care, Manual therapy, and  Re-evaluation  PLAN FOR NEXT SESSION: bil LE MLD and remedial exercise Pump demo sent 05/23/23  DIAGNOSIS ASSESSMENT [x]  Lymphedema, not elsewhere classified [I89.0]  Secondary to:  []  Hereditary/Congenital lymphedema [Q82.0]  []  Praecox []  Tarda []  Other:   LOCATION OF LYMPHEDEMA [x]   Right Leg  [x]  Left Leg  []  Pelvis/Genitals/Hips/Buttocks  []  Abdomen/Trunk  []  Other:  Date of onset, or duration:  2019  SEVERITY Symptoms and observations from physical exam Stage of Lymphedema:  [x] Stage II  []  Stage III  [] Other: []  Hyperkeratosis []  Hyperpigmentation []  Lymphorrhea (weeping) []  Papillomatosis (warts, nodules, papules)  []  Elephantiasis  []  Fibrosis [x]  Pitting edema []  Recurrent episode(s) of infection/cellulitis [x]  Pain [x]  History of Venous leg ulcers and/or wounds [x]  Functional impairments to include limited range of motion or impaired mobility []  Other, explain:   CONSERVATIVE TREATMENT (CT) INITATION Start date (if different from this visit date): 05/22/23  Conservative treatment plan includes the following activities (check all that apply): [x]  Regular exercise  [x]  Elevation of the limb(s) [x]  Compression garment(s) or compression bandage system  []  Compression garments with a minimum of     20-30        mmHg distally  []  Compression bandage system  Type: []  Multi-layer short stretch []  Velcro compression wrap(s)    []  One-time use pre-packaged bandaging (i.e., Profore, Unna boot, Coban)  []  Other:  [x]  Complete decongestive therapy (CDT) or lymphedema therapy   [x]  Minimum of 30 minutes daily MLD or self-MLD  []  Diet assessment with modification recommendations []  Other:   Has the patient tried an [id number] basic pneumatic compression device? []  Yes  [x]  No Comments:    MEASUREMENTS Choose at least two anatomical locations. See above  Right Leg    Left Leg     Ankle                     cm  Ankle                     cm  Calf                     cm   Calf                     cm  Knee  cm  Knee                     cm  Thigh                     cm  Thigh                     cm  Suprapubic (Hip)*                     cm  Suprapubic (Hip)*                     cm  Umbilicus (Waist)*                     cm  Umbilicus (Waist)*                     cm  *Include measurements if patient presents with lymphedema proximally into the genitals or trunk    Charlyn Vialpando, Julieanne Manson, PT 06/05/2023, 5:27 PM

## 2023-06-05 NOTE — Patient Instructions (Addendum)
Deep Effective Breath   1.) Standing, sitting, or laying down place both hands on the belly. Take a deep breath IN, expanding the belly; then breath OUT, contracting the belly. Repeat __5__ times.  2.) at both armpits, make _5__ in-place circles.   3.) at both groins make 5 circles ( where your leg meets your body)    LEG: Knee to Hip - Clear   4.) Pump up outer thigh, inner thigh, and inside of thigh  5.) Next, interlace fingers behind knee IF ABLE and make in-place circles.    Copyright  VHI. All rights reserved.  LEG: Ankle to Hip Sweep   6.) Hands on sides of ankle of involved leg, pump _5__ times up both sides of lower leg,   Copyright  VHI. All rights reserved.  FOOT: Dorsum of Foot and Toes Massage   7.) One hand on top of foot make _5_ stationary circles or pumps, then either on top of toes or each individual toe do _5_ pumps.  Copyright  VHI. All rights reserved.

## 2023-06-11 ENCOUNTER — Other Ambulatory Visit (HOSPITAL_COMMUNITY): Payer: Self-pay

## 2023-06-12 ENCOUNTER — Other Ambulatory Visit: Payer: Self-pay

## 2023-06-12 ENCOUNTER — Other Ambulatory Visit (HOSPITAL_COMMUNITY): Payer: Self-pay

## 2023-06-12 ENCOUNTER — Ambulatory Visit: Payer: Medicare Other | Admitting: Rehabilitation

## 2023-06-12 DIAGNOSIS — C538 Malignant neoplasm of overlapping sites of cervix uteri: Secondary | ICD-10-CM | POA: Diagnosis not present

## 2023-06-12 DIAGNOSIS — I890 Lymphedema, not elsewhere classified: Secondary | ICD-10-CM

## 2023-06-12 DIAGNOSIS — I7381 Erythromelalgia: Secondary | ICD-10-CM | POA: Diagnosis not present

## 2023-06-12 NOTE — Therapy (Signed)
OUTPATIENT PHYSICAL THERAPY  LOWER EXTREMITY ONCOLOGY TREATMENT  Patient Name: [name] MRN: [id number] DOB:1951/01/17, 72 y.o., female Today's Date: 06/16/2023  END OF SESSION:     Past Medical History:  Diagnosis Date   Allergy    Cataract    BILATERAL-REMOVED 2 YEARS AGO   Cervical cancer (HCC) 2024   COVID 09/2021   Encounter for blood transfusion 01/19/2023   2 units given   Erythromelalgia [facility])    followed by neuro Dr Allena Katz , mgd on gabapentin , dx several years ago    GERD (gastroesophageal reflux disease)    Helicobacter pylori gastritis 10/05/2018   History of atrial fibrillation 09/2022   s/p watchman implantation   History of radiation therapy    Cervix- 01/14/23-04/02/23-Dr. 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 Lalla Brothers   Pseudogout of foot    Rheumatoid arthritis(714.0) dx 2010   Shingles    hx of    Tibial plateau fracture, right, closed, initial encounter 07/16/2017   Past Surgical History:  Procedure Laterality Date   ATRIAL FIBRILLATION ABLATION N/A 04/16/2022   Procedure: ATRIAL FIBRILLATION ABLATION;  Surgeon: Lanier Prude, MD;  Location: [facility] IN[facility]IVE CV [facility];  Service: Cardiovascular;  Laterality: N/A;   BREAST BIOPSY  1972   Broken wrist Bilateral 2010   CARDIOVERSION N/A 11/22/2021   Procedure: CARDIOVERSION;  Surgeon: Sande Rives, MD;  Location: [facility] ENDOSCOPY;  Service:  Cardiovascular;  Laterality: N/A;   CATARACT EXTRACTION Bilateral 03/2012   COLONOSCOPY     COSMETIC SURGERY     breast   FRACTURE SURGERY     INNER EAR SURGERY     busted ear drum   LEFT ATRIAL APPENDAGE OCCLUSION N/A 10/03/2022   Procedure: LEFT ATRIAL APPENDAGE OCCLUSION;  Surgeon: Lanier Prude, MD;  Location: [facility] INVASIVE CV LAB;  Service: Cardiovascular;  Laterality: N/A;   MAXIMUM ACCESS (MAS)POSTERIOR LUMBAR INTERBODY FUSION (PLIF) 1 LEVEL N/A 10/11/2016   Procedure: Lumbar one-Sacral one Maximum access posterior lumbar interbody fusion;  Surgeon: Maeola Harman, MD;  Location: [facility] OR;  Service: Neurosurgery;  Laterality: N/A;   OPERATIVE ULTRASOUND N/A 03/05/2023   Procedure: OPERATIVE ULTRASOUND;  Surgeon: Antony Blackbird, MD;  Location: [facility];  Service: Urology;  Laterality: N/A;   OPERATIVE ULTRASOUND N/A 03/13/2023   Procedure: OPERATIVE ULTRASOUND;  Surgeon: Antony Blackbird, MD;  Location: [facility];  Service: Urology;  Laterality: N/A;   OPERATIVE ULTRASOUND N/A 03/20/2023   Procedure: OPERATIVE ULTRASOUND;  Surgeon: Antony Blackbird, MD;  Location: [facility];  Service: Urology;  Laterality: N/A;   OPERATIVE ULTRASOUND N/A 03/26/2023   Procedure: OPERATIVE ULTRASOUND;  Surgeon: Antony Blackbird, MD;  Location: [facility];  Service: Urology;  Laterality: N/A;   OPERATIVE ULTRASOUND N/A 04/02/2023   Procedure: OPERATIVE ULTRASOUND;  Surgeon:  Antony Blackbird, MD;  Location: [facility];  Service: Urology;  Laterality: N/A;   ORIF WRIST FRACTURE Right 07/18/2017   Procedure: OPEN REDUCTION INTERNAL FIXATION (ORIF) WRIST FRACTURE;  Surgeon: Sheral Apley, MD;  Location: [facility] OR;  Service: Orthopedics;  Laterality: Right;   PARATHYROIDECTOMY Left 04/12/2019   Procedure: LEFT SUPERIOR PARATHYROIDECTOMY;  Surgeon: Darnell Level, MD;  Location: [facility] ORS;  Service: General;  Laterality: Left;   TANDEM RING INSERTION  N/A 03/05/2023   Procedure: TANDEM RING INSERTION;  Surgeon: Antony Blackbird, MD;  Location: [facility];  Service: Urology;  Laterality: N/A;   TANDEM RING INSERTION N/A 03/13/2023   Procedure: TANDEM RING INSERTION;  Surgeon: Antony Blackbird, MD;  Location: [facility];  Service: Urology;  Laterality: N/A;   TANDEM RING INSERTION N/A 03/20/2023   Procedure: TANDEM RING INSERTION;  Surgeon: Antony Blackbird, MD;  Location: [facility];  Service: Urology;  Laterality: N/A;   TANDEM RING INSERTION N/A 03/26/2023   Procedure: TANDEM RING INSERTION;  Surgeon: Antony Blackbird, MD;  Location: [facility];  Service: Urology;  Laterality: N/A;   TANDEM RING INSERTION N/A 04/02/2023   Procedure: TANDEM RING INSERTION;  Surgeon: Antony Blackbird, MD;  Location: [facility];  Service: Urology;  Laterality: N/A;   TEE WITHOUT CARDIOVERSION N/A 10/03/2022   Procedure: TRANSESOPHAGEAL ECHOCARDIOGRAM (TEE);  Surgeon: Lanier Prude, MD;  Location: [facility] INVASIVE CV LAB;  Service: Cardiovascular;  Laterality: N/A;   watchman implantation  10/03/2022   Patient Active Problem List   Diagnosis Date Noted   Symptomatic anemia 01/19/2023   Vaginal bleeding 01/18/2023   Cervical cancer (HCC) 12/24/2022   Physical debility 12/24/2022   Presence of Watchman left atrial appendage closure device 10/03/2022   Atrial fibrillation (HCC) 04/01/2022   Snoring 01/02/2022   Lung nodule 10/11/2021   COVID-19 virus infection 10/10/2021   Prolonged QT interval 10/10/2021   Multiple thyroid nodules 04/11/2019   Helicobacter pylori gastritis 10/05/2018   Obesity, Class III, BMI 40-49.9 (morbid obesity) (HCC) 05/12/2018   Hyperglycemia 05/12/2018   Lower extremity edema -chronic 05/12/2018   Iron deficiency anemia 05/12/2018   Erythromelalgia (HCC) 10/17/2017   Hyperparathyroidism (HCC) 05/06/2017   Lumbar stenosis with neurogenic claudication 10/11/2016   H/O  breast augmentation 09/12/2016   L-S radiculopathy 05/30/2016   Neck mass 05/30/2015   Hx of colonic polyp - ssp 11/03/2014   Idiopathic neuropathy 07/06/2014   Hyperlipidemia 06/02/2014   Osteoporosis 10/17/2011   Rheumatoid arthritis (HCC)    Hypertension     PCP: Nira Conn, MD  REFERRING PROVIDER: Antony Blackbird, MD  REFERRING DIAG:  Diagnosis  C53.8 (ICD-10-CM) - Malignant neoplasm of overlapping sites of cervix (HCC)  Lymphedema  THERAPY DIAG:  Erythromelalgia (HCC)  Malignant neoplasm of overlapping sites of cervix (HCC)  Lymphedema, not elsewhere classified  ONSET DATE: 12/24/22  Rationale for Evaluation and Treatment: Rehabilitation  SUBJECTIVE:  SUBJECTIVE STATEMENT:  They did the pump trial and said it was ready to go.  I really liked it.  They said I could get one for each leg.    EVAL: I have lymphedema in both legs.  I think the Lt leg is the worst.  Wounds are all healed.  I have knee high compression stockings.  They seem to contain.  When they used the compression pumps in the hospital I felt like my legs were looking much better.    PERTINENT HISTORY: Cervical cancer stage 3, Completed 5 brachytherapy /radiation treatments on 04/02/23. Chemo was withheld due to baseline neuropathy. Other hx: back pain and spinal fusion. *Erythromelalgia (HCC). (a rare syndrome in which small arteries (arterioles) of the skin dilate periodically, causing a burning pain, making the skin feel hot, and making the feet and, less often, the hands turn red.) Afib, good kidneys no CHF, normal ABI  PAIN:  Are you having pain? Yes NPRS scale: 5-6/10 Pain location:  feet and ankles Pain orientation: Bilateral  PAIN TYPE: aching, burning, and throbbing Pain description: constant  Aggravating  factors: when they get swollen or have episodes of the erythromelagia  Relieving factors: ice or cold water buckets   PRECAUTIONS: Fall, bil LE lymphedema   RED FLAGS: None   WEIGHT BEARING RESTRICTIONS: No  FALLS:  Has patient fallen in last 6 months? No  LIVING ENVIRONMENT: Lives with: lives with their family and lives with their spouse Lives in: House/apartment Has following equipment at home: Environmental consultant - 4 wheeled and Wheelchair (manual)  OCCUPATION: not working  PRIOR LEVEL OF FUNCTION: Independent with community mobility with device and Needs assistance with ADLs  PATIENT GOALS: see if I can get a pump    OBJECTIVE: Note: Objective measures were completed at Evaluation unless otherwise noted.  COGNITION: Overall cognitive status: Within functional limits for tasks assessed   PALPATION: +1 pitting from dorsum of foot to knee bil, feet cold with very fast refill,   OBSERVATIONS / OTHER ASSESSMENTS: feet red to purple which is noted to be the erythromelalgia and has been checked.    SENSATION: Neuropathy bil feet and lower legs   POSTURE: seated in WC  LYMPHEDEMA ASSESSMENTS:   LOWER EXTREMITY LANDMARK RIGHT Eval -  in seated  At groin   30 cm proximal to suprapatella   20 cm proximal to suprapatella   10 cm proximal to suprapatella 62  At midpatella / popliteal crease 50.2  30 cm proximal to floor at lateral plantar foot 46  20 cm proximal to floor at lateral plantar foot 35.2  10 cm proximal to floor at lateral plantar foot 27.6  Circumference of ankle/heel 35.7  5 cm proximal to 1st MTP joint 25.7  Across MTP joint 25.6  Around proximal great toe 10.  (Blank rows = not tested)  LOWER EXTREMITY LANDMARK LEFT Eval - in seated  At groin   30 cm proximal to suprapatella   20 cm proximal to suprapatella   10 cm proximal to suprapatella 59  At midpatella / popliteal crease 51  30 cm proximal to floor at lateral plantar foot 48.5  20 cm proximal to floor  at lateral plantar foot 33.5  10 cm proximal to floor at lateral plantar foot 27  Circumference of ankle/heel   5 cm proximal to 1st MTP joint 25.7  Across MTP joint 25.8  Around proximal great toe 10  (Blank rows = not tested)  GAIT: Distance walked: arrives to [facility] ,  able to walk from [facility] to treatment table ind  Outcome measure: LLIS: 52.9%   TODAY'S TREATMENT:                                                                                                                                          DATE:  06/12/23 Supine with legs on larger bolster under knees and smaller foam roll under ankles Short neck, axillary nodes, inguinal nodes, superficial abdominals and then breathing without resistance due to recent radiation of the pelvic region, then inguino-axillary anastamosis, then lower extremity from proximal to distal then reversing all steps. Then performed on the opposite leg.    06/05/23 Supine with legs on larger bolster under knees and smaller foam roll under ankles Short neck, axillary nodes, inguinal nodes, superficial abdominals and then breathing without resistance due to recent radiation of the pelvic region, then inguino-axillary anastamosis, then lower extremity from proximal to distal educating patient on home steps we will include, then husband came in for the opposite side and was educated on skin stretch, pressure, and technique to help with pt's lower legs as he is able.  Then performed on the opposite leg.   See instruction section for today for steps given.    05/22/23 Discussed POC and treatment options Pt worried about wrapping due to erthromelalgia  Showed velcro and different compression socks.  Pt currently orders from [facility] and reports they seem to work well, but we may need to get a fit more containment Interested in pump so showed pt pump options.   Discussed principles of CDT and demonstrated remedial exercises: seated march, LAQ, ankle pumps, deep breathing,  walking   PATIENT EDUCATION:  Education details: per today's note Person educated: Patient and Spouse Education method: Medical illustrator Education comprehension: verbalized understanding  HOME EXERCISE PROGRAM: remedial exercises: seated march, LAQ, ankle pumps, deep breathing, walking  Self MLD modified: short neck, axillary and inguinal nodes, breathing, then thigh work   ASSESSMENT:  CLINICAL IMPRESSION: Pt was educated on self MLD today and performance on bil legs.  She has decreased skin stretch bil LE's due to chronic lymphedema but no significant fibrosis noted.  Pt tolerated MLD well even with her lower leg condition and her husband is very willing to help.   OBJECTIVE IMPAIRMENTS: decreased activity tolerance, decreased balance, decreased endurance, decreased knowledge of condition, decreased knowledge of use of DME, decreased mobility, and increased edema.   ACTIVITY LIMITATIONS: standing, dressing, and locomotion level  PARTICIPATION LIMITATIONS: cleaning, laundry, and community activity  PERSONAL FACTORS: Age, Time since onset of injury/illness/exacerbation, and 1-2 comorbidities: , CVI, radiation hx  are also affecting patient's functional outcome.   REHAB POTENTIAL: Good  CLINICAL DECISION MAKING: Evolving/moderate complexity  EVALUATION COMPLEXITY: Moderate   GOALS: Goals reviewed with patient? Yes  SHORT TERM GOALS: Target date: 06/05/23  Pt will be educated on CDT principles and options as  well as lymphedema self care Baseline: Goal status: INITIAL   LONG TERM GOALS: Target date: 07/04/23  Pt will be ind with vasopneumatic pump use for chronic lymphedema  Baseline:  Goal status: INITIAL   PLAN:  PT FREQUENCY: 1x/week  PT DURATION: 5 weeks   PLANNED INTERVENTIONS: Therapeutic exercises, Patient/Family education, Self Care, Manual therapy, and Re-evaluation  PLAN FOR NEXT SESSION: bil LE MLD and remedial exercise Pump demo sent  05/23/23  DIAGNOSIS ASSESSMENT [x]  Lymphedema, not elsewhere classified [I89.0]  Secondary to:  []  Hereditary/Congenital lymphedema [Q82.0]  []  Praecox []  Tarda []  Other:   LOCATION OF LYMPHEDEMA [x]   Right Leg  [x]  Left Leg  []  Pelvis/Genitals/Hips/Buttocks  []  Abdomen/Trunk  []  Other:  Date of onset, or duration:  2019  SEVERITY Symptoms and observations from physical exam Stage of Lymphedema:  [x] Stage II  []  Stage III  [] Other: []  Hyperkeratosis []  Hyperpigmentation []  Lymphorrhea (weeping) []  Papillomatosis (warts, nodules, papules)  []  Elephantiasis  []  Fibrosis [x]  Pitting edema []  Recurrent episode(s) of infection/cellulitis [x]  Pain [x]  History of Venous leg ulcers and/or wounds [x]  Functional impairments to include limited range of motion or impaired mobility []  Other, explain:   CONSERVATIVE TREATMENT (CT) INITATION Start date (if different from this visit date): 05/22/23  Conservative treatment plan includes the following activities (check all that apply): [x]  Regular exercise  [x]  Elevation of the limb(s) [x]  Compression garment(s) or compression bandage system  []  Compression garments with a minimum of     20-30        mmHg distally  []  Compression bandage system  Type: []  Multi-layer short stretch []  Velcro compression wrap(s)    []  One-time use pre-packaged bandaging (i.e., Profore, Unna boot, Coban)  []  Other:  [x]  Complete decongestive therapy (CDT) or lymphedema therapy   [x]  Minimum of 30 minutes daily MLD or self-MLD  []  Diet assessment with modification recommendations []  Other:   Has the patient tried an [id number] basic pneumatic compression device? []  Yes  [x]  No Comments:    MEASUREMENTS Choose at least two anatomical locations. See above  Right Leg    Left Leg     Ankle                     cm  Ankle                     cm  Calf                     cm  Calf                     cm  Knee                     cm  Knee                     cm   Thigh                     cm  Thigh                     cm  Suprapubic (Hip)*                     cm  Suprapubic (Hip)*  cm  Umbilicus (Waist)*                     cm  Umbilicus (Waist)*                     cm  *Include measurements if patient presents with lymphedema proximally into the genitals or trunk    Alea Ryer, Julieanne Manson, PT 06/16/2023, 7:22 PM

## 2023-06-13 ENCOUNTER — Other Ambulatory Visit (HOSPITAL_COMMUNITY): Payer: Self-pay

## 2023-06-19 ENCOUNTER — Encounter: Payer: Self-pay | Admitting: Rehabilitation

## 2023-06-19 ENCOUNTER — Ambulatory Visit: Payer: Medicare Other | Admitting: Rehabilitation

## 2023-06-19 DIAGNOSIS — I890 Lymphedema, not elsewhere classified: Secondary | ICD-10-CM

## 2023-06-19 DIAGNOSIS — I7381 Erythromelalgia: Secondary | ICD-10-CM

## 2023-06-19 DIAGNOSIS — C538 Malignant neoplasm of overlapping sites of cervix uteri: Secondary | ICD-10-CM

## 2023-06-23 ENCOUNTER — Other Ambulatory Visit (HOSPITAL_COMMUNITY): Payer: Self-pay

## 2023-06-26 ENCOUNTER — Ambulatory Visit: Payer: Medicare Other | Attending: Radiation Oncology | Admitting: Rehabilitation

## 2023-06-26 ENCOUNTER — Encounter: Payer: Self-pay | Admitting: Rehabilitation

## 2023-06-26 DIAGNOSIS — C538 Malignant neoplasm of overlapping sites of cervix uteri: Secondary | ICD-10-CM | POA: Diagnosis present

## 2023-06-26 DIAGNOSIS — I7381 Erythromelalgia: Secondary | ICD-10-CM | POA: Diagnosis not present

## 2023-06-26 DIAGNOSIS — I890 Lymphedema, not elsewhere classified: Secondary | ICD-10-CM | POA: Insufficient documentation

## 2023-06-26 NOTE — Therapy (Signed)
OUTPATIENT PHYSICAL THERAPY  LOWER EXTREMITY ONCOLOGY TREATMENT  Patient Name: [name] MRN: [id number] DOB:02-26-1951, 72 y.o., female Today's Date: 06/26/2023  END OF SESSION:  PT End of Session - 06/26/23 1201     Visit Number 5    Number of Visits 5    Date for PT Re-Evaluation 07/04/23    Authorization - Visit Number 5    Authorization - Number of Visits 5    PT Start Time 1303    PT Stop Time 1351    PT Time Calculation (min) 48 min    Activity Tolerance Patient tolerated treatment well    Behavior During Therapy [facility] for tasks assessed/performed               Past Medical History:  Diagnosis Date   Allergy    Cataract    BILATERAL-REMOVED 2 YEARS AGO   Cervical cancer (HCC) 2024   COVID 09/2021   Encounter for blood transfusion 01/19/2023   2 units given   Erythromelalgia (HCC)    followed by neuro Dr Allena Katz , mgd on gabapentin , dx several years ago    GERD (gastroesophageal reflux disease)    Helicobacter pylori gastritis 10/05/2018   History of atrial fibrillation 09/2022   s/p watchman implantation   History of radiation therapy    Cervix- 01/14/23-04/02/23-Dr. 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 Lalla Brothers   Pseudogout of foot    Rheumatoid arthritis(714.0) dx 2010   Shingles    hx of    Tibial plateau fracture, right, closed, initial encounter 07/16/2017   Past Surgical History:  Procedure  Laterality Date   ATRIAL FIBRILLATION ABLATION N/A 04/16/2022   Procedure: ATRIAL FIBRILLATION ABLATION;  Surgeon: Lanier Prude, MD;  Location: [facility] INVASIVE CV LAB;  Service: Cardiovascular;  Laterality: N/A;   BREAST BIOPSY  1972   Broken wrist Bilateral 2010   CARDIOVERSION N/A 11/22/2021   Procedure: CARDIOVERSION;  Surgeon: Sande Rives, MD;  Location: [facility] ENDOSCOPY;  Service: Cardiovascular;  Laterality: N/A;   CATARACT EXTRACTION Bilateral 03/2012   COLONOSCOPY     COSMETIC SURGERY     breast   FRACTURE SURGERY     INNER EAR SURGERY     busted ear drum   LEFT ATRIAL APPENDAGE OCCLUSION N/A 10/03/2022   Procedure: LEFT ATRIAL APPENDAGE OCCLUSION;  Surgeon: Lanier Prude, MD;  Location: [facility] INVASIVE CV LAB;  Service: Cardiovascular;  Laterality: N/A;   MAXIMUM ACCESS (MAS)POSTERIOR LUMBAR INTERBODY FUSION (PLIF) 1 LEVEL N/A 10/11/2016   Procedure: Lumbar one-Sacral one Maximum access posterior lumbar interbody fusion;  Surgeon: Maeola Harman, MD;  Location: [facility] OR;  Service: Neurosurgery;  Laterality: N/A;   OPERATIVE ULTRASOUND N/A 03/05/2023   Procedure: OPERATIVE ULTRASOUND;  Surgeon: Antony Blackbird, MD;  Location: [facility];  Service: Urology;  Laterality: N/A;   OPERATIVE ULTRASOUND N/A 03/13/2023   Procedure: OPERATIVE ULTRASOUND;  Surgeon: Antony Blackbird, MD;  Location: [facility];  Service: Urology;  Laterality: N/A;   OPERATIVE ULTRASOUND N/A 03/20/2023   Procedure: OPERATIVE ULTRASOUND;  Surgeon: Antony Blackbird, MD;  Location: [facility];  Service: Urology;  Laterality: N/A;   OPERATIVE ULTRASOUND N/A 03/26/2023   Procedure: OPERATIVE ULTRASOUND;  Surgeon: Antony Blackbird, MD;  Location: [facility];  Service: Urology;  Laterality: N/A;   OPERATIVE ULTRASOUND N/A 04/02/2023   Procedure: OPERATIVE ULTRASOUND;  Surgeon: Antony Blackbird, MD;  Location: [facility];  Service: Urology;  Laterality:  N/A;   ORIF WRIST FRACTURE Right 07/18/2017   Procedure: OPEN REDUCTION INTERNAL FIXATION (ORIF) WRIST FRACTURE;  Surgeon: Sheral Apley, MD;  Location: [facility] OR;  Service: Orthopedics;  Laterality: Right;   PARATHYROIDECTOMY Left 04/12/2019   Procedure: LEFT SUPERIOR PARATHYROIDECTOMY;  Surgeon: Darnell Level, MD;  Location: [facility] ORS;  Service: General;  Laterality: Left;   TANDEM RING INSERTION N/A 03/05/2023   Procedure: TANDEM RING INSERTION;  Surgeon: Antony Blackbird, MD;  Location: [facility];  Service: Urology;  Laterality: N/A;   TANDEM RING INSERTION N/A 03/13/2023   Procedure: TANDEM RING INSERTION;  Surgeon: Antony Blackbird, MD;  Location: [facility];  Service: Urology;  Laterality: N/A;   TANDEM RING INSERTION N/A 03/20/2023   Procedure: TANDEM RING INSERTION;  Surgeon: Antony Blackbird, MD;  Location: [facility];  Service: Urology;  Laterality: N/A;   TANDEM RING INSERTION N/A 03/26/2023   Procedure: TANDEM RING INSERTION;  Surgeon: Antony Blackbird, MD;  Location: [facility];  Service: Urology;  Laterality: N/A;   TANDEM RING INSERTION N/A 04/02/2023   Procedure: TANDEM RING INSERTION;  Surgeon: Antony Blackbird, MD;  Location: [facility];  Service: Urology;  Laterality: N/A;   TEE WITHOUT CARDIOVERSION N/A 10/03/2022   Procedure: TRANSESOPHAGEAL ECHOCARDIOGRAM (TEE);  Surgeon: Lanier Prude, MD;  Location: [facility] INVASIVE CV LAB;  Service: Cardiovascular;  Laterality: N/A;   watchman implantation  10/03/2022   Patient Active Problem List   Diagnosis Date Noted   Symptomatic anemia 01/19/2023   Vaginal bleeding 01/18/2023   Cervical cancer (HCC) 12/24/2022   Physical debility 12/24/2022   Presence of Watchman left atrial appendage closure device 10/03/2022   Atrial fibrillation (HCC) 04/01/2022   Snoring 01/02/2022   Lung nodule 10/11/2021   COVID-19 virus infection 10/10/2021   Prolonged QT interval 10/10/2021    Multiple thyroid nodules 04/11/2019   Helicobacter pylori gastritis 10/05/2018   Obesity, Class III, BMI 40-49.9 (morbid obesity) (HCC) 05/12/2018   Hyperglycemia 05/12/2018   Lower extremity edema -chronic 05/12/2018   Iron deficiency anemia 05/12/2018   Erythromelalgia (HCC) 10/17/2017   Hyperparathyroidism (HCC) 05/06/2017   Lumbar stenosis with neurogenic claudication 10/11/2016   H/O breast augmentation 09/12/2016   L-S radiculopathy 05/30/2016   Neck mass 05/30/2015   Hx of colonic polyp - ssp 11/03/2014   Idiopathic neuropathy 07/06/2014   Hyperlipidemia 06/02/2014   Osteoporosis 10/17/2011   Rheumatoid arthritis (HCC)    Hypertension     PCP: Nira Conn, MD  REFERRING PROVIDER: Antony Blackbird, MD  REFERRING DIAG:  Diagnosis  C53.8 (ICD-10-CM) - Malignant neoplasm of overlapping sites of cervix (HCC)  Lymphedema  THERAPY DIAG:  Erythromelalgia (HCC)  Malignant neoplasm of overlapping sites of cervix (HCC)  Lymphedema, not elsewhere classified  ONSET DATE: 12/24/22  Rationale for Evaluation and Treatment:  Rehabilitation  SUBJECTIVE:                                                                                                                                                                                           SUBJECTIVE STATEMENT: My feet are really bad today.  I think they are just getting worse.   EVAL: I have lymphedema in both legs.  I think the Lt leg is the worst.  Wounds are all healed.  I have knee high compression stockings.  They seem to contain.  When they used the compression pumps in the hospital I felt like my legs were looking much better.    PERTINENT HISTORY: Cervical cancer stage 3, Completed 5 brachytherapy /radiation treatments on 04/02/23. Chemo was withheld due to baseline neuropathy. Other hx: back pain and spinal fusion. *Erythromelalgia (HCC). (a rare syndrome in which small arteries (arterioles) of the skin dilate periodically,  causing a burning pain, making the skin feel hot, and making the feet and, less often, the hands turn red.) Afib, good kidneys no CHF, normal ABI  PAIN:  Are you having pain? Yes NPRS scale: 5-6/10 Pain location:  feet and ankles Pain orientation: Bilateral  PAIN TYPE: aching, burning, and throbbing Pain description: constant  Aggravating factors: when they get swollen or have episodes of the erythromelagia  Relieving factors: ice or cold water buckets   PRECAUTIONS: Fall, bil LE lymphedema   RED FLAGS: None   WEIGHT BEARING RESTRICTIONS: No  FALLS:  Has patient fallen in last 6 months? No  LIVING ENVIRONMENT: Lives with: lives with their family and lives with their spouse Lives in: House/apartment Has following equipment at home: Environmental consultant - 4 wheeled and Wheelchair (manual)  OCCUPATION: not working  PRIOR LEVEL OF FUNCTION: Independent with community mobility with device and Needs assistance with ADLs  PATIENT GOALS: see if I can get a pump    OBJECTIVE: Note: Objective measures were completed at Evaluation unless otherwise noted.  COGNITION: Overall cognitive status: Within functional limits for tasks assessed   PALPATION: +1 pitting from dorsum of foot to knee bil, feet cold with very fast refill,   OBSERVATIONS / OTHER ASSESSMENTS: feet red to purple which is noted to be the erythromelalgia and has been checked.    SENSATION: Neuropathy bil feet and lower legs   POSTURE: seated in WC  LYMPHEDEMA ASSESSMENTS:   LOWER EXTREMITY LANDMARK RIGHT Eval -  in seated 06/19/23  At groin    30 cm proximal to suprapatella    20 cm proximal to suprapatella    10 cm proximal to suprapatella 62 62  At midpatella / popliteal  crease 50.2 50.2  30 cm proximal to floor at lateral plantar foot 46 46.2  20 cm proximal to floor at lateral plantar foot 35.2 34.4  10 cm proximal to floor at lateral plantar foot 27.6 27.2  Circumference of ankle/heel 35.7   5 cm proximal to 1st  MTP joint 25.7 26.0  Across MTP joint 25.6 26  Around proximal great toe 10. 10.2  (Blank rows = not tested)  LOWER EXTREMITY LANDMARK LEFT Eval - in seated 06/19/23  At groin    30 cm proximal to suprapatella    20 cm proximal to suprapatella    10 cm proximal to suprapatella 59 59  At midpatella / popliteal crease 51 52  30 cm proximal to floor at lateral plantar foot 48.5 48.5  20 cm proximal to floor at lateral plantar foot 33.5 33.5  10 cm proximal to floor at lateral plantar foot 27 27.1  Circumference of ankle/heel    5 cm proximal to 1st MTP joint 25.7 25.7  Across MTP joint 25.8 25.8  Around proximal great toe 10 10.4  (Blank rows = not tested)  GAIT: Distance walked: arrives to [facility] , able to walk from [facility] to treatment table ind  Outcome measure: LLIS: 52.9%   TODAY'S TREATMENT:                                                                                                                                          DATE:  06/26/23 Supine with legs on larger bolster under knees and smaller foam roll under ankles Short neck, axillary nodes, inguinal nodes, superficial abdominals and then breathing without resistance due to recent radiation of the pelvic region, then inguino-axillary anastamosis, then lower extremity from proximal to distal then reversing all steps. Then performed on the opposite leg.  06/19/23 Supine with legs on larger bolster under knees and smaller foam roll under ankles Short neck, axillary nodes, inguinal nodes, superficial abdominals and then breathing without resistance due to recent radiation of the pelvic region, then inguino-axillary anastamosis, then lower extremity from proximal to distal then reversing all steps. Then performed on the opposite leg.    06/12/23 Supine with legs on larger bolster under knees and smaller foam roll under ankles Short neck, axillary nodes, inguinal nodes, superficial abdominals and then breathing without resistance  due to recent radiation of the pelvic region, then inguino-axillary anastamosis, then lower extremity from proximal to distal then reversing all steps. Then performed on the opposite leg.    06/05/23 Supine with legs on larger bolster under knees and smaller foam roll under ankles Short neck, axillary nodes, inguinal nodes, superficial abdominals and then breathing without resistance due to recent radiation of the pelvic region, then inguino-axillary anastamosis, then lower extremity from proximal to distal educating patient on home steps we will include, then husband came in for the opposite side and was educated on  skin stretch, pressure, and technique to help with pt's lower legs as he is able.  Then performed on the opposite leg.   See instruction section for today for steps given.    05/22/23 Discussed POC and treatment options Pt worried about wrapping due to erthromelalgia  Showed velcro and different compression socks.  Pt currently orders from [employer] and reports they seem to work well, but we may need to get a fit more containment Interested in pump so showed pt pump options.   Discussed principles of CDT and demonstrated remedial exercises: seated march, LAQ, ankle pumps, deep breathing, walking   PATIENT EDUCATION:  Education details: per today's note Person educated: Patient and Spouse Education method: Medical illustrator Education comprehension: verbalized understanding  HOME EXERCISE PROGRAM: remedial exercises: seated march, LAQ, ankle pumps, deep breathing, walking  Self MLD modified: short neck, axillary and inguinal nodes, breathing, then thigh work   ASSESSMENT:  CLINICAL IMPRESSION: continued MLD today on bil legs.  no changes overall in status of legs.  Pt is now at more than 4 weeks after starting PT visits for her bilateral lower extremity lymphedema without change in lower extremity size or pain.  She continues with home use of compression, self MLD,  exercise, and elevation.  She will benefit from her flexitouch pump at this time.   OBJECTIVE IMPAIRMENTS: decreased activity tolerance, decreased balance, decreased endurance, decreased knowledge of condition, decreased knowledge of use of DME, decreased mobility, and increased edema.   ACTIVITY LIMITATIONS: standing, dressing, and locomotion level  PARTICIPATION LIMITATIONS: cleaning, laundry, and community activity  PERSONAL FACTORS: Age, Time since onset of injury/illness/exacerbation, and 1-2 comorbidities: , CVI, radiation hx  are also affecting patient's functional outcome.   REHAB POTENTIAL: Good  CLINICAL DECISION MAKING: Evolving/moderate complexity  EVALUATION COMPLEXITY: Moderate   GOALS: Goals reviewed with patient? Yes  SHORT TERM GOALS: Target date: 06/05/23  Pt will be educated on CDT principles and options as well as lymphedema self care Baseline: Goal status: MET   LONG TERM GOALS: Target date: 07/04/23  Pt will be ind with vasopneumatic pump use for chronic lymphedema  Baseline:  Goal status: WAITING FOR PUMP   PLAN:  PT FREQUENCY: 1x/week  PT DURATION: 5 weeks   PLANNED INTERVENTIONS: Therapeutic exercises, Patient/Family education, Self Care, Manual therapy, and Re-evaluation  PLAN FOR NEXT SESSION: bil LE MLD and remedial exercise Pump demo sent 05/23/23  DIAGNOSIS ASSESSMENT [x]  Lymphedema, not elsewhere classified [I89.0]  Secondary to:  []  Hereditary/Congenital lymphedema [Q82.0]  []  Praecox []  Tarda []  Other:   LOCATION OF LYMPHEDEMA [x]   Right Leg  [x]  Left Leg  []  Pelvis/Genitals/Hips/Buttocks  []  Abdomen/Trunk  []  Other:  Date of onset, or duration:  2019  SEVERITY Symptoms and observations from physical exam Stage of Lymphedema:  [x] Stage II  []  Stage III  [] Other: []  Hyperkeratosis []  Hyperpigmentation []  Lymphorrhea (weeping) []  Papillomatosis (warts, nodules, papules)  []  Elephantiasis  []  Fibrosis [x]  Pitting edema []   Recurrent episode(s) of infection/cellulitis [x]  Pain [x]  History of Venous leg ulcers and/or wounds [x]  Functional impairments to include limited range of motion or impaired mobility []  Other, explain:   CONSERVATIVE TREATMENT (CT) INITATION Start date (if different from this visit date): 05/22/23  Conservative treatment plan includes the following activities (check all that apply): [x]  Regular exercise  [x]  Elevation of the limb(s) [x]  Compression garment(s) or compression bandage system  [x]  Compression garments with a minimum of     20-30  mmHg distally  []  Compression bandage system  Type: []  Multi-layer short stretch []  Velcro compression wrap(s)    []  One-time use pre-packaged bandaging (i.e., Profore, Unna boot, Coban)  []  Other:  [x]  Complete decongestive therapy (CDT) or lymphedema therapy   [x]  Minimum of 30 minutes daily MLD or self-MLD  []  Diet assessment with modification recommendations []  Other:   Has the patient tried an [id number] basic pneumatic compression device? [x]  Yes  []  No Comments: Liked this pump   MEASUREMENTS Choose at least two anatomical locations.     See above  Right Leg    Left Leg     Ankle                     cm  Ankle                     cm  Calf                     cm  Calf                     cm  Knee                     cm  Knee                     cm  Thigh                     cm  Thigh                     cm  Suprapubic (Hip)*                     cm  Suprapubic (Hip)*                     cm  Umbilicus (Waist)*                     cm  Umbilicus (Waist)*                     cm  *Include measurements if patient presents with lymphedema proximally into the genitals or trunk    Briannie Gutierrez, Julieanne Manson, PT 06/26/2023, 4:09 PM

## 2023-06-30 DIAGNOSIS — I890 Lymphedema, not elsewhere classified: Secondary | ICD-10-CM | POA: Diagnosis not present

## 2023-07-07 ENCOUNTER — Other Ambulatory Visit (HOSPITAL_COMMUNITY): Payer: Self-pay

## 2023-07-07 ENCOUNTER — Ambulatory Visit (HOSPITAL_COMMUNITY)
Admission: RE | Admit: 2023-07-07 | Discharge: 2023-07-07 | Disposition: A | Discharge status: Home / Self Care | Payer: Medicare Other | Source: Ambulatory Visit | Attending: Radiation Oncology | Admitting: Radiation Oncology

## 2023-07-07 DIAGNOSIS — C538 Malignant neoplasm of overlapping sites of cervix uteri: Secondary | ICD-10-CM | POA: Diagnosis present

## 2023-07-07 DIAGNOSIS — C772 Secondary and unspecified malignant neoplasm of intra-abdominal lymph nodes: Secondary | ICD-10-CM | POA: Diagnosis not present

## 2023-07-07 LAB — GLUCOSE, CAPILLARY: Glucose-Capillary: 104 mg/dL — ABNORMAL HIGH (ref 70–99)

## 2023-07-07 MED ORDER — FLUDEOXYGLUCOSE F - 18 (FDG) INJECTION
15.0000 | Freq: Once | INTRAVENOUS | Status: AC | PRN
  Administered 2023-07-07: 13.32 via INTRAVENOUS

## 2023-07-14 DIAGNOSIS — Z8669 Personal history of other diseases of the nervous system and sense organs: Secondary | ICD-10-CM | POA: Diagnosis not present

## 2023-07-14 DIAGNOSIS — Z0130 Encounter for examination of blood pressure without abnormal findings: Secondary | ICD-10-CM | POA: Diagnosis not present

## 2023-07-16 ENCOUNTER — Other Ambulatory Visit (HOSPITAL_COMMUNITY): Payer: Self-pay

## 2023-07-21 ENCOUNTER — Other Ambulatory Visit (HOSPITAL_COMMUNITY): Payer: Self-pay

## 2023-07-24 ENCOUNTER — Other Ambulatory Visit (HOSPITAL_COMMUNITY): Payer: Self-pay

## 2023-07-24 ENCOUNTER — Telehealth: Payer: Self-pay | Admitting: Radiology

## 2023-07-24 NOTE — Telephone Encounter (Signed)
I called the patient to go over her PET scan from 07/07/2023. Overall, the imaging demonstrated a positive response to therapy. [name] was very happy to hear these results. We will review her imaging at our next tumor board as well. She is scheduled to follow-up with Dr. Roselind Messier on 10/05/2022.    Joyice Faster, PA-C

## 2023-07-28 ENCOUNTER — Other Ambulatory Visit: Payer: Self-pay | Admitting: Oncology

## 2023-07-28 NOTE — Progress Notes (Signed)
Gynecologic Oncology Multi-Disciplinary Disposition Conference Note  Date of the Conference: 07/28/2023  Patient Name: [name]  Referring Provider: Dr. Hanley Ben Primary GYN Oncologist: Dr. 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 Disposition is to close follow up and repeat PET imaging in 3 months..   This Multidisciplinary conference took place involving physicians from Gynecologic Oncology, Medical Oncology, Radiation Oncology, Pathology, Radiology along with the Gynecologic Oncology Nurse Practitioner and Gynecologic Oncology Nurse Navigator.  Comprehensive assessment of the patient's malignancy, staging, need for surgery, chemotherapy, radiation therapy, and need for further testing were reviewed. Supportive measures, both inpatient and following discharge were also discussed. The recommended plan of care is documented. Greater than 35 minutes were spent correlating and coordinating this patient's care.

## 2023-07-29 ENCOUNTER — Other Ambulatory Visit: Payer: Self-pay | Admitting: Radiation Oncology

## 2023-07-29 DIAGNOSIS — C538 Malignant neoplasm of overlapping sites of cervix uteri: Secondary | ICD-10-CM

## 2023-08-05 ENCOUNTER — Telehealth: Payer: Self-pay | Admitting: Oncology

## 2023-08-05 NOTE — Telephone Encounter (Signed)
Called [place] and advised her of the follow up appointment with Dr. Alvester Morin on 08/18/23 at 3:15. She verbalized understanding and agreement of the appointment.

## 2023-08-11 ENCOUNTER — Encounter: Payer: Self-pay | Admitting: Psychiatry

## 2023-08-18 ENCOUNTER — Inpatient Hospital Stay: Payer: Medicare Other | Attending: Psychiatry | Admitting: Psychiatry

## 2023-08-18 ENCOUNTER — Other Ambulatory Visit (HOSPITAL_COMMUNITY): Payer: Self-pay

## 2023-08-18 VITALS — BP 140/68 | HR 81 | Temp 98.7°F | Resp 20 | Wt 279.6 lb

## 2023-08-18 DIAGNOSIS — Z923 Personal history of irradiation: Secondary | ICD-10-CM | POA: Insufficient documentation

## 2023-08-18 DIAGNOSIS — M87051 Idiopathic aseptic necrosis of right femur: Secondary | ICD-10-CM | POA: Insufficient documentation

## 2023-08-18 DIAGNOSIS — N939 Abnormal uterine and vaginal bleeding, unspecified: Secondary | ICD-10-CM

## 2023-08-18 DIAGNOSIS — Z8541 Personal history of malignant neoplasm of cervix uteri: Secondary | ICD-10-CM | POA: Diagnosis present

## 2023-08-18 DIAGNOSIS — C538 Malignant neoplasm of overlapping sites of cervix uteri: Secondary | ICD-10-CM

## 2023-08-18 NOTE — Progress Notes (Unsigned)
Gynecologic Oncology Return Clinic Visit  Date of Service: 08/18/2023 Referring Provider: [name], MD   Assessment & Plan: [name] is a 72 y.o. 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  Cervical cancer: - After counseling with Christine. Bertis [name] did not receive concurrent chemo with radiation. - Post treatment PET on 07/07/23 shows great response to treatment. Recommend repeat PET in 3 months. - NED on exam today. - Signs/symptoms of recurrence reviewed. - Continue surveillance with follow-up q30mo months initially. Can alternate with Christine. Roselind [name]. Has follow-up 10/06/23. - Reviewed that after 5 years NED, will be safe to return to Ob/Gyn.   ***Right femoral head avascular necrosis Ortho referral ***   RTC Christine. Roselind [name] 10/06/23. RTC Gyn Onc ~12/2023.  Christine Cliff, MD Gynecologic Oncology   Medical Decision Making I personally spent  TOTAL *** minutes face-to-face and non-face-to-face in the care of this patient, which includes all pre, intra, and post visit time on the date of service.  *** minutes spent reviewing records prior to the visit *** Minutes in patient contact      *** minutes in other billable services *** minutes charting , conferring with consultants etc.   ----------------------- Reason for Visit: ***  Treatment History: Oncology History Overview Note  PD-L1 CPS 1%   Cervical cancer (HCC)  11/29/2022 Pathology Results   FINAL MICROSCOPIC DIAGNOSIS:   A. CERVIX, 10 O'CLOCK, BIOPSY:       Squamous cell carcinoma, at least in situ.       See comment.   COMMENT:   The specimen demonstrates multiple superficially sampled squamous epithelium with high-grade cytological dysplasia forming papillary architecture. There is no submucosal sampling which hinders the evaluation on invasion. Clinical correlation is recommended.     12/19/2022 Imaging    MRI pelvis 7.8 cm solid mass centered in the uterine cervix, causing hydrometros. Tumor involves the upper 2/3 of the vagina and parametrial soft tissues bilaterally, without evidence of hydronephrosis or pelvic sidewall involvement. FIGO stage IIB   Mild bilateral iliac lymphadenopathy, suspicious for metastatic disease.   Sigmoid diverticulosis. No radiographic evidence of diverticulitis.   12/24/2022 Initial Diagnosis   Cervical cancer (HCC)   12/24/2022 Cancer Staging   Staging form: Cervix Uteri, AJCC Version 9 - Clinical stage from 12/24/2022: FIGO Stage IIIC2 (cT2b, cN2a, cM0) - Signed by Christine Delay, MD on 12/31/2022 Stage prefix: Initial diagnosis Para-aortic status: Positive   12/31/2022 PET scan   1. Hypermetabolic cervical mass with hypermetabolic metastatic bilateral iliac chain lymph nodes. 2. Mildly hypermetabolic unenlarged left level II cervical and portacaval lymph nodes are nonspecific. Metastatic disease is difficult to definitively exclude. Recommend attention on follow-up. 3. Focal hypermetabolism associated with a right thyroid nodule. Patient recently underwent thyroid ultrasound 07/08/2022. Please refer to that report. 4. Aortic atherosclerosis (ICD10-I70.0). Coronary artery calcification. 5. Enlarged pulmonic trunk, indicative of pulmonary arterial hypertension.       Interval History: *** Usin0g dilatore every 7-10 days for just a moment at a time Small spottin0g after dilator use bu tnot otherwise No bowel/bladder issue No abdominal/pelvic pain Stable back pain Sometimes bladder pain when it is full More fecal urgency  Overall, feel okay Tryin0g to do a little molre every day Neuropathy in legs/feet are a hinderence to her  No pain on the right pain Sometimes uncomfortable right hip when sits for a while.  Certain seats hurt right hip  Past Medical/Surgical History:  Past Medical History:  Diagnosis Date   Allergy    Cataract    BILATERAL-REMOVED 2  YEARS AGO   Cervical cancer (HCC) 2024   COVID 09/2021   Encounter for blood transfusion 01/19/2023   2 units given   Erythromelalgia [facility])    followed by neuro [name] , mgd on gabapentin , dx several years ago    GERD (gastroesophageal reflux disease)    Helicobacter pylori gastritis 10/05/2018   History of atrial fibrillation 09/2022   s/p watchman implantation   History of radiation therapy    Cervix- 01/14/23-04/02/23-Christine. 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 [employer]   Pseudogout of foot    Rheumatoid arthritis(714.0) dx 2010   Shingles    hx of    Tibial plateau fracture, right, closed, initial encounter 07/16/2017    Past Surgical History:  Procedure Laterality Date   ATRIAL FIBRILLATION ABLATION N/A 04/16/2022   Procedure: ATRIAL FIBRILLATION ABLATION;  Surgeon: Christine Prude, MD;  Location: [facility] INVASIVE CV LAB;  Service: Cardiovascular;  Laterality: N/A;   BREAST BIOPSY  1972   Broken wrist Bilateral 2010   CARDIOVERSION N/A 11/22/2021   Procedure: CARDIOVERSION;  Surgeon: Christine Rives, MD;  Location[facility] ENDOSCOPY;  Service: Cardiovascular;  Laterality: N/A;   CATARACT EXTRACTION Bilateral 03/2012   COLONOSCOPY     COSMETIC SURGERY     breast   FRACTURE SURGERY     INNER EAR SURGERY     busted ear drum   LEFT ATRIAL APPENDAGE OCCLUSION N/A 10/03/2022   Procedure: LEFT ATRIAL APPENDAGE OCCLUSION;  Surgeon:  Christine Prude, MD;  Location: [facility] INVASIVE CV LAB;  Service: Cardiovascular;  Laterality: N/A;   MAXIMUM ACCESS (MAS)POSTERIOR LUMBAR INTERBODY FUSION (PLIF) 1 LEVEL N/A 10/11/2016   Procedure: Lumbar one-Sacral one Maximum access posterior lumbar interbody fusion;  Surgeon: Maeola Harman, MD;  Location: [facility] OR;  Service: Neurosurgery;  Laterality: N/A;   OPERATIVE ULTRASOUND N/A 03/05/2023   Procedure: OPERATIVE ULTRASOUND;  Surgeon: Christine Blackbird, MD;  Location: [facility];  Service: Urology;  Laterality: N/A;   OPERATIVE ULTRASOUND N/A 03/13/2023   Procedure: OPERATIVE ULTRASOUND;  Surgeon: Christine Blackbird, MD;  Location: [facility];  Service: Urology;  Laterality: N/A;   OPERATIVE ULTRASOUND N/A 03/20/2023   Procedure: OPERATIVE ULTRASOUND;  Surgeon: Christine Blackbird, MD;  Location: [facility];  Service: Urology;  Laterality: N/A;   OPERATIVE ULTRASOUND N/A 03/26/2023   Procedure: OPERATIVE ULTRASOUND;  Surgeon: Christine Blackbird, MD;  Location: [facility];  Service: Urology;  Laterality: N/A;   OPERATIVE ULTRASOUND N/A 04/02/2023   Procedure: OPERATIVE ULTRASOUND;  Surgeon: Christine Blackbird, MD;  Location: [facility];  Service: Urology;  Laterality: N/A;   ORIF WRIST FRACTURE Right 07/18/2017   Procedure: OPEN REDUCTION INTERNAL FIXATION (  ORIF) WRIST FRACTURE;  Surgeon: Sheral Apley, MD;  Location: [facility] OR;  Service: Orthopedics;  Laterality: Right;   PARATHYROIDECTOMY Left 04/12/2019   Procedure: LEFT SUPERIOR PARATHYROIDECTOMY;  Surgeon: Darnell Level, MD;  Location: [facility] ORS;  Service: General;  Laterality: Left;   TANDEM RING INSERTION N/A 03/05/2023   Procedure: TANDEM RING INSERTION;  Surgeon: Christine Blackbird, MD;  Location: [facility];  Service: Urology;  Laterality: N/A;   TANDEM RING INSERTION N/A 03/13/2023   Procedure: TANDEM RING INSERTION;  Surgeon: Christine Blackbird, MD;  Location: [facility];  Service: Urology;  Laterality: N/A;   TANDEM RING INSERTION N/A 03/20/2023   Procedure: TANDEM RING INSERTION;  Surgeon: Christine Blackbird, MD;  Location: [facility];  Service: Urology;  Laterality: N/A;   TANDEM RING INSERTION N/A 03/26/2023   Procedure: TANDEM RING INSERTION;  Surgeon: Christine Blackbird, MD;  Location: [facility];  Service: Urology;  Laterality: N/A;   TANDEM RING INSERTION N/A 04/02/2023   Procedure: TANDEM RING INSERTION;  Surgeon: Christine Blackbird, MD;  Location: [facility];  Service: Urology;  Laterality: N/A;   TEE WITHOUT CARDIOVERSION N/A 10/03/2022   Procedure: TRANSESOPHAGEAL ECHOCARDIOGRAM (TEE);  Surgeon: Christine Prude, MD;  Location: [facility] INVASIVE CV LAB;  Service: Cardiovascular;  Laterality: N/A;   watchman implantation  10/03/2022    Family History  Problem Relation Age of Onset   Stroke Mother    Hypertension Father    Heart attack Father    Hypertension Sister        x 3   Hyperthyroidism Sister        x2, s/p RAI ablation   Liver cancer Sister    Hypertension Brother    Hypothyroidism Brother    Colon cancer Neg Hx    Esophageal cancer Neg Hx    Rectal cancer Neg Hx    Stomach cancer Neg Hx    Breast cancer Neg Hx    Ovarian cancer Neg Hx    Endometrial cancer Neg Hx    Pancreatic cancer Neg Hx    Prostate cancer Neg Hx     Social History   Socioeconomic History   Marital status: Married    Spouse name: Not on file   Number of children: 3   Years of education: Not on file   Highest education level: 11th grade  Occupational History   Occupation: Retired  Tobacco Use   Smoking status: Former    Current packs/day: 0.00    Average packs/day: 4.0 packs/day for 4.0 years (16.0 ttl pk-yrs)    Types: Cigarettes    Start date: 33    Quit date: 1985    Years since quitting: 40.0   Smokeless tobacco: Never  Vaping Use   Vaping status: Never Used  Substance and Sexual Activity   Alcohol  use: No    Alcohol/week: 0.0 standard drinks of alcohol   Drug use: No   Sexual activity: Not Currently  Other Topics Concern   Not on file  Social History Narrative   Artist -retired Designer, fashion/clothing   Married, lives with spouse, Dannielle Huh, he is IT support for [employer]   3 sons   2 caffeinated beverages a day   No regular exercise, diet is ok   Right handed   Social Drivers of Health   Financial Resource Strain: Low Risk  (07/18/2022)   Overall Financial Resource Strain (CARDIA)    Difficulty of Paying Living  Expenses: Not hard at all  Food Insecurity: No Food Insecurity (01/19/2023)   Hunger Vital Sign    Worried About Running Out of Food in the Last Year: Never true    Ran Out of Food in the Last Year: Never true  Transportation Needs: No Transportation Needs (01/22/2023)   PRAPARE - Administrator, Civil Service (Medical): No    Lack of Transportation (Non-Medical): No  Physical Activity: Inactive (07/18/2022)   Exercise Vital Sign    Days of Exercise per Week: 0 days    Minutes of Exercise per Session: 0 min  Stress: No Stress Concern Present (07/18/2022)   [employer] of Occupational Health - Occupational Stress Questionnaire    Feeling of Stress : Not at all  Social Connections: Socially Integrated (07/18/2022)   Social Connection and Isolation Panel [NHANES]    Frequency of Communication with Friends and Family: More than three times a week    Frequency of Social Gatherings with Friends and Family: More than three times a week    Attends Religious Services: More than 4 times per year    Active Member of [employer] or Organizations: Yes    Attends Engineer, structural: More than 4 times per year    Marital Status: Married    Current Medications:  Current Outpatient Medications:    acetaminophen (TYLENOL) 650 MG CR tablet, Take 1,300 mg by mouth every 8 (eight) hours as needed for pain., Disp: , Rfl:    amoxicillin (AMOXIL) 500 MG  capsule, Take 4 capsules (2,000 mg total) by mouth as directed., Disp: 12 capsule, Rfl: 6   BIOTIN PO, Take 1 tablet by mouth at bedtime., Disp: , Rfl:    Cholecalciferol (VITAMIN D) 125 MCG (5000 UT) CAPS, Take 5,000 Units by mouth in the morning., Disp: , Rfl:    ferrous sulfate (FEROSUL) 325 (65 FE) MG tablet, Take 1 tablet (325 mg total) by mouth daily., Disp: 100 tablet, Rfl: 0   fluticasone (FLONASE) 50 MCG/ACT nasal spray, Place 1-2 sprays into both nostrils daily as needed for allergies or rhinitis., Disp: , Rfl:    furosemide (LASIX) 20 MG tablet, Take 3 tablets (60 mg total) by mouth 2 (two) times daily., Disp: 540 tablet, Rfl: 3   gabapentin (NEURONTIN) 100 MG capsule, TAKE 3 CAPSULES BY MOUTH IN THE MORNING AND 2 CAPSULES AT BEDTIME. OK TO TAKE EXTRA CAPSULE AT BEDTIME AS NEEDED, Disp: 540 capsule, Rfl: 3   hydroxychloroquine (PLAQUENIL) 200 MG tablet, Take 1 tablet (200 mg total) by mouth 2 (two) times daily with food or milk, Disp: 180 tablet, Rfl: 1   hydroxypropyl methylcellulose / hypromellose (ISOPTO TEARS / GONIOVISC) 2.5 % ophthalmic solution, Place 1 drop into both eyes in the morning., Disp: , Rfl:    losartan (COZAAR) 100 MG tablet, Take 1 tablet (100 mg total) by mouth daily., Disp: 90 tablet, Rfl: 1   Menthol, Topical Analgesic, (BIOFREEZE) 4 % GEL, Apply 1 application  topically at bedtime. Neuropathy feet/ankles, Disp: , Rfl:    metoprolol succinate (TOPROL-XL) 50 MG 24 hr tablet, Take 1 tablet (50 mg total) by mouth 2 (two) times daily., Disp: 270 tablet, Rfl: 1   Multiple Vitamins-Minerals (MULTIVITAMIN WITH MINERALS) tablet, Take 2 tablets by mouth daily., Disp: , Rfl:    OVER THE COUNTER MEDICATION, [place] 8,000mg  once daily, Disp: , Rfl:    OVER THE COUNTER MEDICATION, Vitamin B1 600mg  once daily, Disp: , Rfl:    potassium chloride SA (KLOR-CON  M) 20 MEQ tablet, Take 1 tablet by mouth 2 times daily., Disp: 180 tablet, Rfl: 1   prochlorperazine (COMPAZINE) 10 MG  tablet, Take 1 tablet (10 mg) by mouth every 6 hours as needed for nausea or vomiting., Disp: 30 tablet, Rfl: 0   sulfaSALAzine (AZULFIDINE) 500 MG tablet, Take 2 tablets (1,000 mg total) by mouth 2 (two) times daily., Disp: 360 tablet, Rfl: 1   vitamin C (ASCORBIC ACID) 500 MG tablet, Take 500 mg by mouth daily., Disp: , Rfl:   Review of Symptoms: Complete 10-system review is negative except as above in Interval History.  Physical Exam: BP (!) 153/63 (BP Location: Right Arm, Patient Position: Sitting) Comment: Notified RN  Pulse 81   Temp 98.7 F (37.1 C) (Oral)   Resp 20   Wt 279 lb 9.6 oz (126.8 kg)   SpO2 95%   BMI 45.13 kg/m  General: Alert, oriented, no acute distress. HEENT: Normocephalic, atraumatic. Neck symmetric without masses. Sclera anicteric.  Chest: Normal work of breathing. Clear to auscultation bilaterally.   Cardiovascular: Regular rate and rhythm, no murmurs. Abdomen: Soft, nontender.  Normoactive bowel sounds.  No masses appreciated.   Extremities: Grossly normal range of motion.  Warm, well perfused.  No edema bilaterally. Skin: No rashes or lesions noted. Lymphatics: No cervical, supraclavicular, or inguinal adenopathy. GU: Normal appearing external genitalia without erythema, excoriation, or lesions.  Speculum exam reveals coaptation at vaginal apex with cervix adherent to the posterior vaginal wall, cervix incompletely visualized. What is visualized appears normal without nodularity or mass.  Bimanual exam reveals narrowing, adhesion at the vaginal apex. No nodularity or pelvic mass.  Exam chaperoned by Kimberly [place], CMA   Laboratory & Radiologic Studies:  NM PET Image Restag (PS) Skull Base To Thigh 07/07/2023  Narrative CLINICAL DATA:  Subsequent treatment strategy for diagnosed with cervical cancer in May. Radiation therapy complete.  EXAM: NUCLEAR MEDICINE PET SKULL BASE TO THIGH  TECHNIQUE: 13.3 mCi F-18 FDG was injected intravenously. Full-ring  PET imaging was performed from the skull base to thigh after the radiotracer. CT data was obtained and used for attenuation correction and anatomic localization.  Fasting blood glucose: 104 mg/dl  COMPARISON:  96/29/5284  FINDINGS: Mediastinal blood pool activity: SUV max 2.8  Liver activity: SUV max NA  NECK: The previously described left-sided level 2 hypermetabolic cervical node measures 4 mm and is no longer hypermetabolic. 28/5 today.  Right-sided thyroid enlargement without well-defined residual hypermetabolism.  Incidental CT findings: No cervical adenopathy.  CHEST: No pulmonary parenchymal or thoracic nodal hypermetabolism.  Incidental CT findings: Left atrial appendage occlusion device. Aortic atherosclerosis. Mild cardiomegaly. Pulmonary artery enlargement with the outflow tract measuring 3.9 cm. Tiny hiatal hernia. Mild centrilobular emphysema.  ABDOMEN/PELVIS: The cervical primary is markedly improved. Low-level hypermetabolism at a S.U.V. max of 4.0, without well-defined residual mass. Equivocal residual soft tissue fullness on 167/5. Compare 4.5 x 5.9 cm and a S.U.V. max of 27.0 on the prior.  Iliac chain nodal hypermetabolism has resolved. The left-sided node measures 5 mm on 160/5 versus 10 mm on the prior.  Right common iliac node measures 7 mm on 148/5 versus 11 mm on the prior.  Incidental CT findings: Normal adrenal glands. Segment 4A subcentimeter hepatic cyst. Colonic stool burden suggests constipation. Abdominal aortic atherosclerosis. Presumably radiation induced interstitial thickening throughout the pelvis.  SKELETON: No abnormal marrow activity.  Incidental CT findings: Right femoral head avascular necrosis. Lumbar spine fixation. Bilateral calcified breast implants.  IMPRESSION: 1. Marked response  to therapy of cervical primary. Complete metabolic response to therapy of pelvic nodal metastasis. 2. No new or progressive disease. 3.  Left-sided level 2 and portacaval nodal hypermetabolism has resolved. Favor these nodes being reactive. Atypical distribution of distant nodal metastasis felt less likely. 4. Incidental findings, including: Aortic atherosclerosis (ICD10-I70.0) and emphysema (ICD10-J43.9). Pulmonary artery enlargement suggests pulmonary arterial hypertension. 5. Right femoral head avascular necrosis.   Electronically Signed By: Jeronimo Greaves M.D. On: 07/24/2023 11:35

## 2023-08-18 NOTE — Patient Instructions (Signed)
It was a pleasure to see you in clinic today. - Exam looks good today - Referral to orthopedics - Return visit planned in May for follow-up - PET in 3 months if permitted by insurance  Thank you very much for allowing me to provide care for you today.  I appreciate your confidence in choosing our Gynecologic Oncology team at [facility].  If you have any questions about your visit today please call our office or send [place] a MyChart message and we will get back to you as soon as possible.

## 2023-08-26 ENCOUNTER — Encounter: Payer: Self-pay | Admitting: Psychiatry

## 2023-08-28 ENCOUNTER — Other Ambulatory Visit (HOSPITAL_COMMUNITY): Payer: Self-pay

## 2023-08-29 ENCOUNTER — Telehealth: Payer: Self-pay | Admitting: *Deleted

## 2023-08-29 ENCOUNTER — Other Ambulatory Visit (HOSPITAL_COMMUNITY): Payer: Self-pay

## 2023-08-29 MED ORDER — HYDROXYCHLOROQUINE SULFATE 200 MG PO TABS
200.0000 mg | ORAL_TABLET | Freq: Two times a day (BID) | ORAL | 1 refills | Status: DC
  Filled 2023-08-29: qty 60, 30d supply, fill #0
  Filled 2023-08-29: qty 180, 90d supply, fill #0
  Filled 2023-09-14 – 2023-09-30 (×2): qty 60, 30d supply, fill #1
  Filled 2023-12-11: qty 60, 30d supply, fill #2

## 2023-08-29 NOTE — Telephone Encounter (Signed)
 CALLED PATIENT TO ASK ABOUT RESCHEDULING FU APPT. ON 10-06-23, DUE TO DR. KINARD BEING OFF, APPT. RESCHEDULED FOR 10-20-23 @ 3:45 PM, LVM FOR A RETURN CALL

## 2023-09-01 ENCOUNTER — Other Ambulatory Visit (HOSPITAL_COMMUNITY): Payer: Self-pay

## 2023-09-10 ENCOUNTER — Ambulatory Visit (HOSPITAL_BASED_OUTPATIENT_CLINIC_OR_DEPARTMENT_OTHER): Payer: Medicare Other | Admitting: Orthopaedic Surgery

## 2023-09-14 ENCOUNTER — Other Ambulatory Visit (HOSPITAL_COMMUNITY): Payer: Self-pay

## 2023-09-14 ENCOUNTER — Other Ambulatory Visit: Payer: Self-pay | Admitting: Family Medicine

## 2023-09-15 ENCOUNTER — Other Ambulatory Visit (HOSPITAL_COMMUNITY): Payer: Self-pay

## 2023-09-15 MED ORDER — POTASSIUM CHLORIDE CRYS ER 20 MEQ PO TBCR
20.0000 meq | EXTENDED_RELEASE_TABLET | Freq: Two times a day (BID) | ORAL | 0 refills | Status: DC
  Filled 2023-09-15: qty 180, 90d supply, fill #0

## 2023-09-16 ENCOUNTER — Other Ambulatory Visit: Payer: Self-pay | Admitting: Psychiatry

## 2023-09-16 ENCOUNTER — Telehealth: Payer: Self-pay | Admitting: *Deleted

## 2023-09-16 DIAGNOSIS — M87051 Idiopathic aseptic necrosis of right femur: Secondary | ICD-10-CM

## 2023-09-16 NOTE — Telephone Encounter (Signed)
Patient called back and wants to have the orthopaedic appt. Explained that the referral will be placed again and the office will call her for an appointment

## 2023-09-16 NOTE — Telephone Encounter (Signed)
LMOM for the patient to call the office back. Need to ask the patient about her missed orthopaedic appt.  (09/10/23 with Dr Steward Drone) If patient wants to see the orthopaedic office a new referral will need to be placed.

## 2023-09-18 ENCOUNTER — Other Ambulatory Visit (HOSPITAL_COMMUNITY): Payer: Self-pay

## 2023-10-01 ENCOUNTER — Encounter: Payer: Self-pay | Admitting: Internal Medicine

## 2023-10-06 ENCOUNTER — Ambulatory Visit: Payer: Self-pay | Admitting: Radiation Oncology

## 2023-10-10 ENCOUNTER — Other Ambulatory Visit (HOSPITAL_COMMUNITY): Payer: Self-pay

## 2023-10-15 ENCOUNTER — Telehealth: Payer: Self-pay

## 2023-10-15 NOTE — Telephone Encounter (Signed)
 Called to check on patient 1 year after Watchman implant (procedure date 10/03/2022).  Left message to call back.

## 2023-10-20 ENCOUNTER — Ambulatory Visit: Payer: Medicare Other | Admitting: Psychiatry

## 2023-10-20 ENCOUNTER — Ambulatory Visit: Payer: Self-pay | Admitting: Radiation Oncology

## 2023-10-21 NOTE — Telephone Encounter (Signed)
 Left message to call back

## 2023-10-22 NOTE — Telephone Encounter (Signed)
 Called to check in with patient, who had LAAO on 10/03/2022. The patient reports she is doing well from a cardiac perspective with no issues.  The patient understands to call with questions or concerns.  She was grateful for call.

## 2023-10-23 ENCOUNTER — Encounter (HOSPITAL_COMMUNITY)
Admission: RE | Admit: 2023-10-23 | Discharge: 2023-10-23 | Disposition: A | Discharge status: Home / Self Care | Payer: Medicare Other | Source: Ambulatory Visit | Attending: Psychiatry | Admitting: Psychiatry

## 2023-10-23 DIAGNOSIS — C538 Malignant neoplasm of overlapping sites of cervix uteri: Secondary | ICD-10-CM | POA: Diagnosis present

## 2023-10-23 LAB — GLUCOSE, CAPILLARY: Glucose-Capillary: 115 mg/dL — ABNORMAL HIGH (ref 70–99)

## 2023-10-23 MED ORDER — FLUDEOXYGLUCOSE F - 18 (FDG) INJECTION
13.9500 | Freq: Once | INTRAVENOUS | Status: AC
  Administered 2023-10-23: 13.9 via INTRAVENOUS

## 2023-10-28 NOTE — Progress Notes (Signed)
 Radiation Oncology         [phone] ________________________________  Name: [name] MRN: [id number]  Date: 10/30/2023  DOB: 11-17-1950  Follow-Up Visit Note  CC: Christine Georges, MD  Christine Cliff, MD  No diagnosis found.  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     Cancer Staging  Cervical cancer [facility]) Staging form: Cervix Uteri, AJCC Version 9 - Clinical stage from 12/24/2022: FIGO Stage IIIC2 (cT2b, cN2a, cM0) - Signed by Christine Delay, MD on 12/31/2022   Interval Since Last Radiation: 6 month and 28 days    Indication for treatment: Curative        Radiation treatment dates: 01/14/23 through 04/02/23 (Pelvic IMRT - 01/14/23 through 02/24/23) (Cervix Brachytherapy - 5 treatment sessions delivered delivered from 03/05/23 through 04/02/23) Site/dose:    1) Pelvis - 45 Gy delivered in 25 Fx at 1.8 Gy/Fx 2) Pelvis boost - 9 Gy delivered in 5 Fx at 1.8 Gy/Fx 3) Cervix (brachytherapy) - 27.5 Gy delivered in 5 Fx at 5.5 Gy/Fx Technique/Mode:  1&2) IMRT / Photon  3) HDR Ir-192 / Brachytherapy  Beams/energy:    1&2) 6X 3) GMP Ir-192 HDR  Narrative:  The patient returns today for routine follow-up. She was last seen in office on 05-05-23 for a routine follow up. She continued to follow up with her specialist to manage her erythromelalgia and PT.   She presented for a restaging PET scan on 07-07-23 which showed marked response to therapy without any evidence of new or progressive disease. She presented for a follow up with Dr. Alvester [name] on 08-18-23. At that time, she reported doing well overall, typically using her dilator as instructed. She reported minimal spotting after the dilator's use and complained of hip pain which she was referred to ortho for. [name] was noted as NED on examination.    Most recent PET scan on 10-23-23  (result not available)      No other significant oncologic interval history  since the patient was last seen.                        Allergies:  is allergic to amlodipine besylate, divalproex sodium, nortriptyline, hydromorphone hcl, other, rosuvastatin, and tramadol.  Meds: Current Outpatient Medications  Medication Sig Dispense Refill   acetaminophen (TYLENOL) 650 MG CR tablet Take 1,300 mg by mouth every 8 (eight) hours as needed for pain.     amoxicillin (AMOXIL) 500 MG capsule Take 4 capsules (2,000 mg total) by mouth as directed. 12 capsule 6   BIOTIN PO Take 1 tablet by mouth at bedtime.     Cholecalciferol (VITAMIN D) 125 MCG (5000 UT) CAPS Take 5,000 Units by mouth in the morning.     ferrous sulfate (FEROSUL) 325 (65 FE) MG tablet Take 1 tablet (325 mg total) by mouth daily. 100 tablet 0   fluticasone (FLONASE) 50 MCG/ACT nasal spray Place 1-2 sprays into both nostrils daily as needed for allergies or rhinitis.     furosemide (LASIX) 20 MG tablet Take 3 tablets (60 mg total) by mouth 2 (two) times daily. 540 tablet 3   gabapentin (NEURONTIN) 100 MG capsule TAKE 3 CAPSULES BY MOUTH IN THE MORNING AND 2 CAPSULES AT BEDTIME. OK TO TAKE EXTRA CAPSULE AT BEDTIME AS NEEDED 540 capsule 3   hydroxychloroquine (PLAQUENIL) 200 MG tablet Take 1 tablet with food or milk twice a day 180 tablet 1  hydroxypropyl methylcellulose / hypromellose (ISOPTO TEARS / GONIOVISC) 2.5 % ophthalmic solution Place 1 drop into both eyes in the morning.     losartan (COZAAR) 100 MG tablet Take 1 tablet (100 mg total) by mouth daily. 90 tablet 1   Menthol, Topical Analgesic, (BIOFREEZE) 4 % GEL Apply 1 application  topically at bedtime. Neuropathy feet/ankles     metoprolol succinate (TOPROL-XL) 50 MG 24 hr tablet Take 1 tablet (50 mg total) by mouth 2 (two) times daily. 270 tablet 1   Multiple Vitamins-Minerals (MULTIVITAMIN WITH MINERALS) tablet Take 2 tablets by mouth daily.     OVER THE COUNTER MEDICATION Moringa 8,000mg  once daily     OVER THE COUNTER MEDICATION Vitamin B1 600mg  once  daily     potassium chloride SA (KLOR-CON M) 20 MEQ tablet Take 1 tablet by mouth 2 times daily. 180 tablet 0   prochlorperazine (COMPAZINE) 10 MG tablet Take 1 tablet (10 mg) by mouth every 6 hours as needed for nausea or vomiting. 30 tablet 0   sulfaSALAzine (AZULFIDINE) 500 MG tablet Take 2 tablets (1,000 mg total) by mouth 2 (two) times daily. 360 tablet 1   vitamin C (ASCORBIC ACID) 500 MG tablet Take 500 mg by mouth daily.     No current facility-administered medications for this encounter.    Physical Findings: The patient is in no acute distress. Patient is alert and oriented.  vitals were not taken for this visit. .  No significant changes. Lungs are clear to auscultation bilaterally. Heart has regular rate and rhythm. No palpable cervical, supraclavicular, or axillary adenopathy. Abdomen soft, non-tender, normal bowel sounds.   Lab Findings: Lab Results  Component Value Date   WBC 4.1 03/05/2023   HGB 11.7 (L) 03/05/2023   HCT 34.1 (L) 03/05/2023   MCV 89.7 03/05/2023   PLT 154 03/05/2023    Radiographic Findings: No results found.  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  The patient is recovering from the effects of radiation.  ***  Plan:  ***   *** minutes of total time was spent for this patient encounter, including preparation, face-to-face counseling with the patient and coordination of care, physical exam, and documentation of the encounter. ____________________________________  Christine Lade, PhD, MD  This document serves as a record of services personally performed by Christine Blackbird, MD. It was created on his behalf by [name], a trained medical scribe. The creation of this record is based on the scribe's personal observations and the provider's statements to them. This document has been checked and approved by the attending provider.

## 2023-10-30 ENCOUNTER — Encounter: Payer: Self-pay | Admitting: Radiation Oncology

## 2023-10-30 ENCOUNTER — Telehealth: Payer: Self-pay | Admitting: *Deleted

## 2023-10-30 ENCOUNTER — Ambulatory Visit
Admission: RE | Admit: 2023-10-30 | Discharge: 2023-10-30 | Disposition: A | Discharge status: Home / Self Care | Payer: Medicare Other | Source: Ambulatory Visit | Attending: Radiation Oncology | Admitting: Radiation Oncology

## 2023-10-30 VITALS — Temp 98.4°F | Resp 20 | Ht 66.0 in | Wt 274.2 lb

## 2023-10-30 DIAGNOSIS — Z79899 Other long term (current) drug therapy: Secondary | ICD-10-CM | POA: Diagnosis not present

## 2023-10-30 DIAGNOSIS — Z923 Personal history of irradiation: Secondary | ICD-10-CM | POA: Diagnosis not present

## 2023-10-30 DIAGNOSIS — C538 Malignant neoplasm of overlapping sites of cervix uteri: Secondary | ICD-10-CM | POA: Diagnosis present

## 2023-10-30 NOTE — Progress Notes (Signed)
 [name] is here today for follow up post radiation to the pelvic.  They completed their radiation on: 04/02/23   Does the patient complain of any of the following:  Pain: Reports intermittent pain to pelvis with dilators use.  Abdominal bloating: No Diarrhea/Constipation: intermittent constipation.  Nausea/Vomiting: No Vaginal Discharge: No Blood in Urine or Stool: Yes, blood in stool due to hemorrhoids.  Urinary Issues (dysuria/incomplete emptying/ incontinence/ increased frequency/urgency): Reports pain at night when bladder is full.  Does patient report using vaginal dilator 2-3 times a week and/or sexually active 2-3 weeks: Yes Post radiation skin changes: No   Additional comments if applicable:   Temp 98.4 F (36.9 C) (Temporal)   Resp 20   Ht 5\' 6"  (1.676 m)   Wt 274 lb 4 oz (124.4 kg)   SpO2 93%   BMI 44.27 kg/m

## 2023-10-30 NOTE — Telephone Encounter (Signed)
 Rebeca from radiation sent in basket message regarding the patient follow up appt with GYN ONC. Patient scheduled to see Dr Alvester Morin on 6/16 at 3:15 pm.  Rebeca to reach out to the patient for appt date/time.

## 2023-11-05 ENCOUNTER — Other Ambulatory Visit (HOSPITAL_COMMUNITY): Payer: Self-pay

## 2023-11-10 ENCOUNTER — Telehealth: Payer: Self-pay

## 2023-11-10 ENCOUNTER — Other Ambulatory Visit: Payer: Self-pay | Admitting: Oncology

## 2023-11-10 NOTE — Telephone Encounter (Signed)
 Patient called in requesting results of recent PET scan.

## 2023-11-10 NOTE — Progress Notes (Signed)
 Gynecologic Oncology Multi-Disciplinary Disposition Conference Note  Date of the Conference: 11/11/2022  Patient Name: [name]  Referring Provider: Dr. Hyacinth Meeker Primary GYN Oncologist: Dr. 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 Disposition is to no further imaging unless patient has symptoms.   This Multidisciplinary conference took place involving physicians from Gynecologic Oncology, Medical Oncology, Radiation Oncology, Pathology, Radiology along with the Gynecologic Oncology Nurse Practitioner and Gynecologic Oncology Nurse Navigator.  Comprehensive assessment of the patient's malignancy, staging, need for surgery, chemotherapy, radiation therapy, and need for further testing were reviewed. Supportive measures, both inpatient and following discharge were also discussed. The recommended plan of care is documented. Greater than 35 minutes were spent correlating and coordinating this patient's care.

## 2023-11-11 ENCOUNTER — Other Ambulatory Visit (INDEPENDENT_AMBULATORY_CARE_PROVIDER_SITE_OTHER)

## 2023-11-11 ENCOUNTER — Encounter: Payer: Self-pay | Admitting: Orthopaedic Surgery

## 2023-11-11 ENCOUNTER — Ambulatory Visit (INDEPENDENT_AMBULATORY_CARE_PROVIDER_SITE_OTHER): Admitting: Orthopaedic Surgery

## 2023-11-11 DIAGNOSIS — Z6841 Body Mass Index (BMI) 40.0 and over, adult: Secondary | ICD-10-CM | POA: Diagnosis not present

## 2023-11-11 DIAGNOSIS — M25551 Pain in right hip: Secondary | ICD-10-CM

## 2023-11-11 NOTE — Progress Notes (Signed)
 Office Visit Note   Patient: [name]           Date of Birth: [birth year]           MRN: [id number] Visit Date: 11/11/2023              Requested by: [name], MD [address] [place],  [place] [address] PCP: [name], MD   Assessment & Plan: Visit Diagnoses:  1. Pain in right hip   2. Body mass index 40.0-44.9, adult [facility])     Plan: [name] is a 73 year old female with right trochanteric hip pain.  Treatment options were explained in currently pain is manageable.  Will provide IT band exercises.  Encourage patient to focus on weight loss.  She is currently asymptomatic from the AVN.  Follow-Up Instructions: No follow-ups on file.   Orders:  Orders Placed This Encounter  Procedures   XR HIP UNILAT W OR W/O PELVIS 2-3 VIEWS RIGHT   No orders of the defined types were placed in this encounter.     Procedures: No procedures performed   Clinical Data: No additional findings.   Subjective: Chief Complaint  Patient presents with   Right Hip - Pain    HPI [name] is a 73 year old female referral from gynecologist for right hip pain.  She has had pain for about a year.  Reports posterior lateral hip pain.  Denies any numbness tingling radicular symptoms or groin pain.  She has pain with sitting.  She has been using over-the-counter ointment and ice. Review of Systems  Constitutional: Negative.   HENT: Negative.    Eyes: Negative.   Respiratory: Negative.    Cardiovascular: Negative.   Endocrine: Negative.   Musculoskeletal: Negative.   Neurological: Negative.   Hematological: Negative.   Psychiatric/Behavioral: Negative.    All other systems reviewed and are negative.    Objective: Vital Signs: There were no vitals taken for this visit.  Physical Exam Vitals and nursing note reviewed.  Constitutional:      Appearance: She is well-developed.  HENT:     Head: Atraumatic.     Nose: Nose normal.  Eyes:     Extraocular  Movements: Extraocular movements intact.  Cardiovascular:     Pulses: Normal pulses.  Pulmonary:     Effort: Pulmonary effort is normal.  Abdominal:     Palpations: Abdomen is soft.  Musculoskeletal:     Cervical back: Neck supple.  Skin:    General: Skin is warm.     Capillary Refill: Capillary refill takes less than 2 seconds.  Neurological:     Mental Status: She is alert. Mental status is at baseline.  Psychiatric:        Behavior: Behavior normal.        Thought Content: Thought content normal.        Judgment: Judgment normal.     Ortho Exam Examination of the right hip shows of fluid range of motion without any groin pain.  She is tender to the trochanteric region.  She has no back pain.  No sciatic tension signs. Specialty Comments:  No specialty comments available.  Imaging: XR HIP UNILAT W OR W/O PELVIS 2-3 VIEWS RIGHT Result Date: 11/11/2023 X-rays of the right hip show slight collapse to the superior femoral head.  Mild degenerative changes in both hips.  There is slight degenerative spurring around the greater trochanter.    PMFS History: Patient Active Problem List   Diagnosis Date Noted  Symptomatic anemia 01/19/2023   Vaginal bleeding 01/18/2023   Cervical cancer (HCC) 12/24/2022   Physical debility 12/24/2022   Presence of Watchman left atrial appendage closure device 10/03/2022   Atrial fibrillation (HCC) 04/01/2022   Snoring 01/02/2022   Lung nodule 10/11/2021   COVID-19 virus infection 10/10/2021   Prolonged QT interval 10/10/2021   Multiple thyroid nodules 04/11/2019   Helicobacter pylori gastritis 10/05/2018   Obesity, Class III, BMI 40-49.9 (morbid obesity) (HCC) 05/12/2018   Hyperglycemia 05/12/2018   Lower extremity edema -chronic 05/12/2018   Iron deficiency anemia 05/12/2018   Erythromelalgia (HCC) 10/17/2017   Hyperparathyroidism (HCC) 05/06/2017   Lumbar stenosis with neurogenic claudication 10/11/2016   H/O breast augmentation  09/12/2016   L-S radiculopathy 05/30/2016   Neck mass 05/30/2015   Hx of colonic polyp - ssp 11/03/2014   Idiopathic neuropathy 07/06/2014   Hyperlipidemia 06/02/2014   Osteoporosis 10/17/2011   Rheumatoid arthritis (HCC)    Hypertension    Past Medical History:  Diagnosis Date   Allergy    Cataract    BILATERAL-REMOVED 2 YEARS AGO   Cervical cancer (HCC) 2024   COVID 09/2021   Encounter for blood transfusion 01/19/2023   2 units given   Erythromelalgia (HCC)    followed by neuro Dr Allena Katz , mgd on gabapentin , dx several years ago    GERD (gastroesophageal reflux disease)    Helicobacter pylori gastritis 10/05/2018   History of atrial fibrillation 09/2022   s/p watchman implantation   History of radiation therapy    Cervix- 01/14/23-04/02/23-Dr. 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 Lalla Brothers   Pseudogout of foot    Rheumatoid arthritis(714.0) dx 2010   Shingles    hx of    Tibial plateau fracture, right, closed, initial encounter 07/16/2017    Family History  Problem Relation Age of Onset   Stroke Mother    Hypertension Father    Heart attack Father    Hypertension Sister        x 3   Hyperthyroidism Sister        x2, s/p RAI ablation   Liver cancer Sister    Hypertension Brother    Hypothyroidism Brother    Colon cancer Neg Hx    Esophageal cancer Neg Hx    Rectal cancer Neg Hx    Stomach cancer Neg Hx    Breast  cancer Neg Hx    Ovarian cancer Neg Hx    Endometrial cancer Neg Hx    Pancreatic cancer Neg Hx    Prostate cancer Neg Hx     Past Surgical History:  Procedure Laterality Date   ATRIAL FIBRILLATION ABLATION N/A 04/16/2022   Procedure: ATRIAL FIBRILLATION ABLATION;  Surgeon: Lanier Prude, MD;  Location: [facility]ASIVE [facility];  Service: Cardiovascular;  Laterality: N/A;   BREAST  BIOPSY  1972   Broken wrist Bilateral 2010   CARDIOVERSION N/A 11/22/2021   Procedure: CARDIOVERSION;  Surgeon: Sande Rives, MD;  Location: [facility] ENDOSCOPY;  Service: Cardiovascular;  Laterality: N/A;   CATARACT EXTRACTION Bilateral 03/2012   COLONOSCOPY     COSMETIC SURGERY     breast   FRACTURE SURGERY     INNER EAR SURGERY     busted ear drum   LEFT ATRIAL APPENDAGE OCCLUSION N/A 10/03/2022   Procedure: LEFT ATRIAL APPENDAGE OCCLUSION;  Surgeon: Lanier Prude, MD;  Location: [facility] CV [facility];  Service: Cardiovascular;  Laterality: N/A;   MAXIMUM ACCESS (MAS)POSTERIOR LUMBAR INTERBODY FUSION (PLIF) 1 LEVEL N/A 10/11/2016   Procedure: Lumbar one-Sacral one Maximum access posterior lumbar interbody fusion;  Surgeon: Maeola Harman, MD;  Location: [facility] OR;  Service: Neurosurgery;  Laterality: N/A;   OPERATIVE ULTRASOUND N/A 03/05/2023   Procedure: OPERATIVE ULTRASOUND;  Surgeon: Antony Blackbird, MD;  Location: [facility];  Service: Urology;  Laterality: N/A;   OPERATIVE ULTRASOUND N/A 03/13/2023   Procedure: OPERATIVE ULTRASOUND;  Surgeon: Antony Blackbird, MD;  Location: [facility];  Service: Urology;  Laterality: N/A;   OPERATIVE ULTRASOUND N/A 03/20/2023   Procedure: OPERATIVE ULTRASOUND;  Surgeon: Antony Blackbird, MD;  Location: [facility];  Service: Urology;  Laterality: N/A;   OPERATIVE ULTRASOUND N/A 03/26/2023   Procedure: OPERATIVE ULTRASOUND;  Surgeon: Antony Blackbird, MD;  Location: [facility];  Service: Urology;  Laterality: N/A;    OPERATIVE ULTRASOUND N/A 04/02/2023   Procedure: OPERATIVE ULTRASOUND;  Surgeon: Antony Blackbird, MD;  Location[facility];  Service: Urology;  Laterality: N/A;   ORIF WRIST FRACTURE Right 07/18/2017   Procedure: OPEN REDUCTION INTERNAL FIXATION (ORIF) WRIST FRACTURE;  Surgeon: Sheral Apley, MD;  Location: [facility] OR;  Service: Orthopedics;  Laterality: Right;   PARATHYROIDECTOMY Left 04/12/2019   Procedure: LEFT SUPERIOR PARATHYROIDECTOMY;  Surgeon: Darnell Level, MD;  Location: [facility] ORS;  Service: General;  Laterality: Left;   TANDEM RING INSERTION N/A 03/05/2023   Procedure: TANDEM RING INSERTION;  Surgeon: Antony Blackbird, MD;  Location: [facility];  Service: Urology;  Laterality: N/A;   TANDEM RING INSERTION N/A 03/13/2023   Procedure: TANDEM RING INSERTION;  Surgeon: Antony Blackbird, MD;  Location: [facility];  Service: Urology;  Laterality: N/A;   TANDEM RING INSERTION N/A 03/20/2023   Procedure: TANDEM RING INSERTION;  Surgeon: Antony Blackbird, MD;  Location: [facility];  Service: Urology;  Laterality: N/A;   TANDEM RING INSERTION N/A 03/26/2023   Procedure: TANDEM RING INSERTION;  Surgeon: Antony Blackbird, MD;  Location: [facility];  Service: Urology;  Laterality: N/A;   TANDEM RING INSERTION N/A 04/02/2023   Procedure: TANDEM RING INSERTION;  Surgeon: Antony Blackbird, MD;  Location: [facility];  Service: Urology;  Laterality: N/A;   TEE WITHOUT CARDIOVERSION N/A 10/03/2022   Procedure: TRANSESOPHAGEAL ECHOCARDIOGRAM (TEE);  Surgeon: Lanier Prude, MD;  Location: [facility] INVASIVE CV LAB;  Service: Cardiovascular;  Laterality: N/A;   watchman implantation  10/03/2022   Social History   Occupational History   Occupation: Retired  Tobacco Use   Smoking status: Former    Current packs/day: 0.00    Average packs/day: 4.0 packs/day for 4.0 years (16.0 ttl pk-yrs)    Types: Cigarettes    Start date: 31     Quit date: 1985    Years since quitting: 40.2   Smokeless tobacco:  Never  Vaping Use   Vaping status: Never Used  Substance and Sexual Activity   Alcohol use: No    Alcohol/week: 0.0 standard drinks of alcohol   Drug use: No   Sexual activity: Not Currently

## 2023-11-12 ENCOUNTER — Telehealth: Payer: Self-pay | Admitting: Radiation Oncology

## 2023-11-12 NOTE — Telephone Encounter (Signed)
 Earlier this week the patient's recent PET scan was reviewed in the multidisciplinary gynecologic oncology conference.  Radiologic interpretation was the patient had no residual activity within the cervix consistent with complete response to her radiation therapy for cervical cancer.  Patchy hypermetabolism was noted in the right upper right sacrum. She did have a recent fall and reported pain in this area as a consequence of her fall.  This would likely explain the mild uptake in the right sacrum.  She will continue to follow-up with her orthopedic surgeon concerning her right hip pain and will continue to follow-up with gynecologic oncology and radiation oncology concerning her cervical cancer.  Billie Lade, M.D.

## 2023-11-13 ENCOUNTER — Other Ambulatory Visit (HOSPITAL_COMMUNITY): Payer: Self-pay

## 2023-11-13 DIAGNOSIS — I7381 Erythromelalgia: Secondary | ICD-10-CM | POA: Diagnosis not present

## 2023-11-13 DIAGNOSIS — M0609 Rheumatoid arthritis without rheumatoid factor, multiple sites: Secondary | ICD-10-CM | POA: Diagnosis not present

## 2023-11-13 MED ORDER — HYDROXYCHLOROQUINE SULFATE 200 MG PO TABS
200.0000 mg | ORAL_TABLET | Freq: Two times a day (BID) | ORAL | 1 refills | Status: DC
  Filled 2023-11-13: qty 180, 90d supply, fill #0
  Filled 2024-02-22: qty 180, 90d supply, fill #1

## 2023-11-13 MED ORDER — SULFASALAZINE 500 MG PO TABS
1000.0000 mg | ORAL_TABLET | Freq: Two times a day (BID) | ORAL | 1 refills | Status: DC
  Filled 2023-11-13 – 2024-01-05 (×2): qty 360, 90d supply, fill #0
  Filled 2024-04-11: qty 360, 90d supply, fill #1

## 2023-12-11 ENCOUNTER — Other Ambulatory Visit: Payer: Self-pay

## 2023-12-11 ENCOUNTER — Other Ambulatory Visit: Payer: Self-pay | Admitting: Family Medicine

## 2023-12-11 ENCOUNTER — Other Ambulatory Visit (HOSPITAL_COMMUNITY): Payer: Self-pay

## 2023-12-11 MED ORDER — POTASSIUM CHLORIDE CRYS ER 20 MEQ PO TBCR
20.0000 meq | EXTENDED_RELEASE_TABLET | Freq: Two times a day (BID) | ORAL | 0 refills | Status: DC
  Filled 2023-12-11 (×2): qty 180, 90d supply, fill #0

## 2024-01-05 ENCOUNTER — Other Ambulatory Visit (HOSPITAL_COMMUNITY): Payer: Self-pay

## 2024-01-05 ENCOUNTER — Ambulatory Visit: Payer: Medicare Other | Admitting: Psychiatry

## 2024-01-19 ENCOUNTER — Encounter: Payer: Self-pay | Admitting: Neurology

## 2024-01-19 ENCOUNTER — Ambulatory Visit: Payer: Medicare Other | Admitting: Neurology

## 2024-01-19 ENCOUNTER — Other Ambulatory Visit (HOSPITAL_COMMUNITY): Payer: Self-pay

## 2024-01-19 VITALS — BP 141/75 | HR 69 | Ht 66.0 in

## 2024-01-19 DIAGNOSIS — I7381 Erythromelalgia: Secondary | ICD-10-CM | POA: Diagnosis not present

## 2024-01-19 DIAGNOSIS — R202 Paresthesia of skin: Secondary | ICD-10-CM

## 2024-01-19 MED ORDER — GABAPENTIN 100 MG PO CAPS
ORAL_CAPSULE | ORAL | 3 refills | Status: AC
  Filled 2024-01-19: qty 540, fill #0
  Filled 2024-04-01: qty 540, 90d supply, fill #0
  Filled 2024-07-07: qty 540, 90d supply, fill #1

## 2024-01-19 NOTE — Progress Notes (Signed)
 Follow-up Visit   Date: 01/19/2024    [name] MRN: [id number] DOB: March 01, 1951    [name] is a 73 y.o. right-handed Caucasian female with hypertension, GERD, rheumatoid arthritis, erythromelalgia, status post L5-S1 lumbar fusion and decompression, and cervical cancer (2024) returning to the clinic for follow-up of erythromelalgia.  The patient was accompanied to the clinic by self.  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  Pain is worse.  She has previously treid:  Lyrica  (swelling), nortriptyline  (tremor), Cymbalta  (swelling), depakote  (swelling).  She currently takes gabapentin  300mg  in the morning and 200mg  at bedtime.  She developed cognitive side effects on higher dose in the past.  We discussed seeing pain management for alternative options, which she will consider.  In the meantime, she will continue gabapentin  300mg  in the morning, 200mg  at bedtime, and extra dose as needed.   Bilateral hand paresthesias, possible carpal tunnel syndrome, stable and less bothersome.    --------------------------------------------- UPDATE 01/19/2024:  She is here for follow-up visit.  She continues to have ongoing pain in the feet due to erythromelalgia.  On days when her pain is severe, she tried taking extra dose of gabapentin , but did not feel that it significantly helped.  She applies Biofreeze and ice which provides some relief. She is asking about alternative medication options.  She completed radiation for cervical cancer and is currently in remission. She denies any significant hand numbness/tingling or low back pain.  She is much more sedentary and has gained weight.    Medications:  Current Outpatient Medications on File Prior to Visit  Medication Sig Dispense Refill   acetaminophen  (TYLENOL ) 650 MG CR tablet Take 1,300 mg by mouth every 8 (eight) hours as needed for pain.     amoxicillin  (AMOXIL ) 500 MG capsule Take  4 capsules (2,000 mg total) by mouth as directed. 12 capsule 6   BIOTIN  PO Take 1 tablet by mouth at bedtime.     Cholecalciferol  (VITAMIN D ) 125 MCG (5000 UT) CAPS Take 5,000 Units by mouth in the morning.     ferrous sulfate  (FEROSUL) 325 (65 FE) MG tablet Take 1 tablet (325 mg total) by mouth daily. 100 tablet 0   fluticasone  (FLONASE ) 50 MCG/ACT nasal spray Place 1-2 sprays into both nostrils daily as needed for allergies or rhinitis.     furosemide  (LASIX ) 20 MG tablet Take 3 tablets (60 mg total) by mouth 2 (two) times daily. 540 tablet 3   hydroxychloroquine  (PLAQUENIL ) 200 MG tablet Take 1 tablet (200 mg total) by mouth 2 (two) times daily with food or milk. 180 tablet 1   hydroxypropyl methylcellulose / hypromellose (ISOPTO TEARS / GONIOVISC) 2.5 % ophthalmic solution Place 1 drop into both eyes in the morning.     losartan  (COZAAR ) 100 MG tablet Take 1 tablet (100 mg total) by mouth daily. 90 tablet 1   Menthol , Topical Analgesic, (BIOFREEZE) 4 % GEL Apply 1 application  topically at bedtime. Neuropathy feet/ankles     metoprolol  succinate (TOPROL -XL) 50 MG 24 hr tablet Take 1 tablet (50 mg total) by mouth 2 (two) times daily. 270 tablet 1   Multiple Vitamins-Minerals (MULTIVITAMIN WITH MINERALS) tablet Take 2 tablets by mouth daily.     OVER THE COUNTER MEDICATION Moringa 8,000mg  once daily     OVER THE COUNTER MEDICATION Vitamin B1 600mg  once daily     potassium chloride  SA (KLOR-CON  M) 20 MEQ tablet Take 1 tablet by mouth  2 times daily. Patient needs an appt 180 tablet 0   prochlorperazine  (COMPAZINE ) 10 MG tablet Take 1 tablet (10 mg) by mouth every 6 hours as needed for nausea or vomiting. 30 tablet 0   sulfaSALAzine  (AZULFIDINE ) 500 MG tablet Take 2 tablets (1,000 mg total) by mouth 2 (two) times daily. 360 tablet 1   vitamin C  (ASCORBIC ACID ) 500 MG tablet Take 500 mg by mouth daily.     No current facility-administered medications on file prior to visit.    Allergies:   Allergies  Allergen Reactions   Amlodipine  Besylate Other (See Comments)    Tremors   Divalproex  Sodium Swelling and Other (See Comments)   Nortriptyline  Nausea And Vomiting and Other (See Comments)    tremors   Hydromorphone  Hcl Other (See Comments)    Headache, muscle tightness   Other     Band-aid (skin redness)   Rosuvastatin  Other (See Comments)    Muscle aches, pains. Resolved with stopping medication.  Muscle aches / pains   Tramadol  Other (See Comments)    Felt "stoned"    Vital Signs:  BP (!) 141/75   Pulse 69   Ht 5\' 6"  (1.676 m)   SpO2 95%   BMI 44.27 kg/m   Neurological Exam: MENTAL STATUS including orientation to time, place, person, recent and remote memory, attention span and concentration, language, and fund of knowledge is normal.  Speech is not dysarthric.  CRANIAL NERVES:   Pupils equal round and reactive to light.  Normal conjugate, extra-ocular eye movements in all directions of gaze.  No ptosis.  Face is symmetric. Palate elevates symmetrically.  Tongue is midline.  MOTOR:  Motor strength is 5/5 in all extremities.  No atrophy, fasciculations or abnormal movements.  No pronator drift.  Tone is normal.    MSRs:  Reflexes are 2+/4 throughout.  SENSORY:  Intact to vibration reduced at the ankles bilaterally, intact at the knees.  COORDINATION/GAIT:  Normal finger-to- nose-finger. Gait not tested, patient in wheelchair.   Data: n/a   Thank you for allowing me to participate in patient's care.  If I can answer any additional questions, I would be pleased to do so.    Sincerely,    Teryn Boerema K. Lydia Sams, DO

## 2024-01-26 ENCOUNTER — Other Ambulatory Visit: Payer: Self-pay | Admitting: Family Medicine

## 2024-01-26 ENCOUNTER — Other Ambulatory Visit (HOSPITAL_COMMUNITY): Payer: Self-pay

## 2024-01-26 DIAGNOSIS — I10 Essential (primary) hypertension: Secondary | ICD-10-CM

## 2024-01-26 MED ORDER — LOSARTAN POTASSIUM 100 MG PO TABS
100.0000 mg | ORAL_TABLET | Freq: Every day | ORAL | 0 refills | Status: DC
  Filled 2024-01-26: qty 30, 30d supply, fill #0

## 2024-01-27 ENCOUNTER — Telehealth: Payer: Self-pay | Admitting: *Deleted

## 2024-01-27 NOTE — Telephone Encounter (Signed)
 Spoke with Ms. Pine who called the office to reschedule her appointment with Dr. Daisey Dryer on Monday, 6/16 due to a dental appt. Patient is having two teeth extracted. Pt was given a new appt. On Monday, July 28 at 3 pm. Pt agreed to date and time and had no further concerns at this time.

## 2024-02-02 ENCOUNTER — Ambulatory Visit: Admitting: Psychiatry

## 2024-02-22 ENCOUNTER — Other Ambulatory Visit: Payer: Self-pay | Admitting: Family Medicine

## 2024-02-22 DIAGNOSIS — I10 Essential (primary) hypertension: Secondary | ICD-10-CM

## 2024-02-23 ENCOUNTER — Other Ambulatory Visit (HOSPITAL_COMMUNITY): Payer: Self-pay

## 2024-02-23 ENCOUNTER — Other Ambulatory Visit: Payer: Self-pay

## 2024-02-23 MED ORDER — LOSARTAN POTASSIUM 100 MG PO TABS
100.0000 mg | ORAL_TABLET | Freq: Every day | ORAL | 0 refills | Status: DC
  Filled 2024-02-23: qty 30, 30d supply, fill #0

## 2024-02-23 MED ORDER — METOPROLOL SUCCINATE ER 50 MG PO TB24
50.0000 mg | ORAL_TABLET | Freq: Two times a day (BID) | ORAL | 0 refills | Status: DC
  Filled 2024-02-23: qty 60, 30d supply, fill #0

## 2024-02-24 ENCOUNTER — Other Ambulatory Visit (HOSPITAL_COMMUNITY): Payer: Self-pay

## 2024-03-11 ENCOUNTER — Other Ambulatory Visit: Payer: Self-pay | Admitting: Family Medicine

## 2024-03-15 ENCOUNTER — Other Ambulatory Visit: Payer: Self-pay

## 2024-03-15 ENCOUNTER — Encounter: Payer: Self-pay | Admitting: Psychiatry

## 2024-03-15 ENCOUNTER — Other Ambulatory Visit (HOSPITAL_BASED_OUTPATIENT_CLINIC_OR_DEPARTMENT_OTHER): Payer: Self-pay

## 2024-03-15 ENCOUNTER — Other Ambulatory Visit (HOSPITAL_COMMUNITY)
Admission: RE | Admit: 2024-03-15 | Discharge: 2024-03-15 | Disposition: A | Discharge status: Home / Self Care | Source: Ambulatory Visit | Attending: Psychiatry | Admitting: Psychiatry

## 2024-03-15 ENCOUNTER — Inpatient Hospital Stay: Attending: Psychiatry | Admitting: Psychiatry

## 2024-03-15 ENCOUNTER — Other Ambulatory Visit (HOSPITAL_COMMUNITY): Payer: Self-pay

## 2024-03-15 VITALS — BP 139/61 | HR 66 | Temp 97.8°F | Resp 19 | Wt 283.0 lb

## 2024-03-15 DIAGNOSIS — Z8541 Personal history of malignant neoplasm of cervix uteri: Secondary | ICD-10-CM | POA: Diagnosis not present

## 2024-03-15 DIAGNOSIS — Z923 Personal history of irradiation: Secondary | ICD-10-CM | POA: Insufficient documentation

## 2024-03-15 DIAGNOSIS — C538 Malignant neoplasm of overlapping sites of cervix uteri: Secondary | ICD-10-CM

## 2024-03-15 DIAGNOSIS — Z01411 Encounter for gynecological examination (general) (routine) with abnormal findings: Secondary | ICD-10-CM | POA: Diagnosis present

## 2024-03-15 DIAGNOSIS — Z1151 Encounter for screening for human papillomavirus (HPV): Secondary | ICD-10-CM | POA: Insufficient documentation

## 2024-03-15 MED ORDER — POTASSIUM CHLORIDE CRYS ER 20 MEQ PO TBCR
20.0000 meq | EXTENDED_RELEASE_TABLET | Freq: Two times a day (BID) | ORAL | 0 refills | Status: AC
  Filled 2024-03-15: qty 180, 90d supply, fill #0

## 2024-03-15 NOTE — Progress Notes (Signed)
 Gynecologic Oncology Return Clinic Visit  Date of Service: 03/15/2024 Referring Provider: Ronal Pinal, MD   Assessment & Plan: [name] is a 73 y.o. 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  Cervical cancer: - After counseling with Dr. Lonn did not receive concurrent chemo with radiation. - Post treatment PET on 07/07/23 shows great response to treatment. Repeat PET 11/10/23 overall NED. - 41mo PET follow-up ordered for Sept, prior to Rad Onc follow-up. If continues to look good, may be able to space out scans. - NED on exam today. - Annual pap collected. - Vaginal dilator use reviewed.  - Signs/symptoms of recurrence reviewed. - Continue surveillance with follow-up q841mo months initially. Can alternate with Dr. Shannon. Has follow-up 04/29/24. - Reviewed that after 5 years NED, will be safe to return to Ob/Gyn.  Right femoral head avascular necrosis: - Noted on PET - S/p ortho referral, nothing to do   RTC Dr. Shannon 04/29/24. RTC Gyn Onc ~07/2024.  Hoy Masters, MD Gynecologic Oncology   Medical Decision Making I personally spent  TOTAL 25 minutes face-to-face and non-face-to-face in the care of this patient, which includes all pre, intra, and post visit time on the date of service.   ----------------------- Reason for Visit: Surveillance  Treatment History: Oncology History Overview Note  PD-L1 CPS 1%   Cervical cancer (HCC)  11/29/2022 Pathology Results   FINAL MICROSCOPIC DIAGNOSIS:   A. CERVIX, 10 O'CLOCK, BIOPSY:       Squamous cell carcinoma, at least in situ.       See comment.   COMMENT:   The specimen demonstrates multiple superficially sampled squamous epithelium with high-grade cytological dysplasia forming papillary architecture. There is no submucosal sampling which hinders the evaluation on invasion. Clinical correlation is recommended.      12/19/2022 Imaging   MRI pelvis 7.8 cm solid mass centered in the uterine cervix, causing hydrometros. Tumor involves the upper 2/3 of the vagina and parametrial soft tissues bilaterally, without evidence of hydronephrosis or pelvic sidewall involvement. FIGO stage IIB   Mild bilateral iliac lymphadenopathy, suspicious for metastatic disease.   Sigmoid diverticulosis. No radiographic evidence of diverticulitis.   12/24/2022 Initial Diagnosis   Cervical cancer (HCC)   12/24/2022 Cancer Staging   Staging form: Cervix Uteri, AJCC Version 9 - Clinical stage from 12/24/2022: FIGO Stage IIIC2 (cT2b, cN2a, cM0) - Signed by Lonn Hicks, MD on 12/31/2022 Stage prefix: Initial diagnosis Para-aortic status: Positive   12/31/2022 PET scan   1. Hypermetabolic cervical mass with hypermetabolic metastatic bilateral iliac chain lymph nodes. 2. Mildly hypermetabolic unenlarged left level II cervical and portacaval lymph nodes are nonspecific. Metastatic disease is difficult to definitively exclude. Recommend attention on follow-up. 3. Focal hypermetabolism associated with a right thyroid  nodule. Patient recently underwent thyroid  ultrasound 07/08/2022. Please refer to that report. 4. Aortic atherosclerosis (ICD10-I70.0). Coronary artery calcification. 5. Enlarged pulmonic trunk, indicative of pulmonary arterial hypertension.       Interval History: Patient overall doing well.  Reports that she saw orthopedics and they said nothing to do.  Still using vaginal dilator about 1 time a week. No new vaginal bleeding, abdominal/pelvic pain, unintentional weight loss, change in bowel or bladder habits, early satiety, bloating, nausea/vomiting.     Past Medical/Surgical History: Past Medical History:  Diagnosis Date   Allergy    Cataract    BILATERAL-REMOVED 2 YEARS AGO   Cervical cancer (HCC) 2024  COVID 09/2021   Encounter for blood transfusion 01/19/2023   2 units given   Erythromelalgia [facility])     followed by neuro Dr Tobie , mgd on gabapentin  , dx several years ago    GERD (gastroesophageal reflux disease)    Helicobacter pylori gastritis 10/05/2018   History of atrial fibrillation 09/2022   s/p watchman implantation   History of radiation therapy    Cervix- 01/14/23-04/02/23-Dr. 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 Cindie   Pseudogout of foot    Rheumatoid arthritis(714.0) dx 2010   Shingles    hx of    Tibial plateau fracture, right, closed, initial encounter 07/16/2017    Past Surgical History:  Procedure Laterality Date   ATRIAL FIBRILLATION ABLATION N/A 04/16/2022   Procedure: ATRIAL FIBRILLATION ABLATION;  Surgeon: Cindie Ole DASEN, MD;  Location: [facility] INVASIVE CV LAB;  Service: Cardiovascular;  Laterality: N/A;   BREAST BIOPSY  1972   Broken wrist Bilateral 2010   CARDIOVERSION N/A 11/22/2021   Procedure: CARDIOVERSION;  Surgeon: Barbaraann Darryle Ned, MD;  Location: [facility] ENDOSCOPY;  Service: Cardiovascular;  Laterality: N/A;   CATARACT EXTRACTION Bilateral 03/2012   COLONOSCOPY     COSMETIC SURGERY     breast   FRACTURE SURGERY     INNER EAR SURGERY     busted ear drum   LEFT ATRIAL APPENDAGE OCCLUSION N/A 10/03/2022   Procedure: LEFT ATRIAL APPENDAGE OCCLUSION;  Surgeon: Cindie Ole DASEN, MD;  Location: [facility] INVASIVE CV LAB;  Service: Cardiovascular;  Laterality: N/A;   MAXIMUM ACCESS (MAS)POSTERIOR LUMBAR INTERBODY  FUSION (PLIF) 1 LEVEL N/A 10/11/2016   Procedure: Lumbar one-Sacral one Maximum access posterior lumbar interbody fusion;  Surgeon: Fairy Levels, MD;  Location[facility] OR;  Service: Neurosurgery;  Laterality: N/A;   OPERATIVE ULTRASOUND N/A 03/05/2023   Procedure: OPERATIVE ULTRASOUND;  Surgeon: Nasuti Lynwood, MD;  Location: [facility];  Service: Urology;  Laterality: N/A;   OPERATIVE ULTRASOUND N/A 03/13/2023   Procedure: OPERATIVE ULTRASOUND;  Surgeon: Nasuti Lynwood, MD;  Location: [facility];  Service: Urology;  Laterality: N/A;   OPERATIVE ULTRASOUND N/A 03/20/2023   Procedure: OPERATIVE ULTRASOUND;  Surgeon: Nasuti Lynwood, MD;  Location: [facility];  Service: Urology;  Laterality: N/A;   OPERATIVE ULTRASOUND N/A 03/26/2023   Procedure: OPERATIVE ULTRASOUND;  Surgeon: Nasuti Lynwood, MD;  Location: [facility];  Service: Urology;  Laterality: N/A;   OPERATIVE ULTRASOUND N/A 04/02/2023   Procedure: OPERATIVE ULTRASOUND;  Surgeon: Nasuti Lynwood, MD;  Location: [facility];  Service: Urology;  Laterality: N/A;   ORIF WRIST FRACTURE Right 07/18/2017   Procedure: OPEN REDUCTION INTERNAL FIXATION (ORIF) WRIST FRACTURE;  Surgeon: Beverley Evalene BIRCH, MD;  Location: [facility] OR;  Service: Orthopedics;  Laterality: Right;   PARATHYROIDECTOMY Left 04/12/2019   Procedure: LEFT  SUPERIOR PARATHYROIDECTOMY;  Surgeon: Eletha Boas, MD;  Location: [facility] ORS;  Service: General;  Laterality: Left;   TANDEM RING INSERTION N/A 03/05/2023   Procedure: TANDEM RING INSERTION;  Surgeon: Shannon Agent, MD;  Location: [facility];  Service: Urology;  Laterality: N/A;   TANDEM RING INSERTION N/A 03/13/2023   Procedure: TANDEM RING INSERTION;  Surgeon: Shannon Agent, MD;  Location: [facility];  Service: Urology;  Laterality: N/A;   TANDEM RING INSERTION N/A 03/20/2023   Procedure: TANDEM RING INSERTION;  Surgeon: Shannon Agent, MD;   Location: [facility];  Service: Urology;  Laterality: N/A;   TANDEM RING INSERTION N/A 03/26/2023   Procedure: TANDEM RING INSERTION;  Surgeon: Shannon Agent, MD;  Location: [facility];  Service: Urology;  Laterality: N/A;   TANDEM RING INSERTION N/A 04/02/2023   Procedure: TANDEM RING INSERTION;  Surgeon: Shannon Agent, MD;  Location: [facility];  Service: Urology;  Laterality: N/A;   TEE WITHOUT CARDIOVERSION N/A 10/03/2022   Procedure: TRANSESOPHAGEAL ECHOCARDIOGRAM (TEE);  Surgeon: Cindie Ole DASEN, MD;  Location: [facility] INVASIVE CV LAB;  Service: Cardiovascular;  Laterality: N/A;   watchman implantation  10/03/2022    Family History  Problem Relation Age of Onset   Stroke Mother    Hypertension Father    Heart attack Father    Hypertension Sister        x 3   Hyperthyroidism Sister        x2, s/p RAI ablation   Liver cancer Sister    Hypertension Brother    Hypothyroidism Brother    Colon cancer Neg Hx    Esophageal cancer Neg Hx    Rectal cancer Neg Hx    Stomach cancer Neg Hx    Breast cancer Neg Hx    Ovarian cancer Neg Hx    Endometrial cancer Neg Hx    Pancreatic cancer Neg Hx    Prostate cancer Neg Hx     Social History   Socioeconomic History   Marital status: Married    Spouse name: Not on file   Number of children: 3   Years of education: Not on file   Highest education level: 11th grade  Occupational History   Occupation: Retired  Tobacco Use   Smoking status: Former    Current packs/day: 0.00    Average packs/day: 4.0 packs/day for 4.0 years (16.0 ttl pk-yrs)    Types: Cigarettes    Start date: 79    Quit date: 1985    Years since quitting: 40.5   Smokeless tobacco: Never  Vaping Use   Vaping status: Never Used  Substance and Sexual Activity   Alcohol use: No    Alcohol/week: 0.0 standard drinks of alcohol   Drug use: No   Sexual activity: Not Currently  Other Topics Concern   Not on file  Social  History Narrative   Artist -retired Designer, fashion/clothing   Married, lives with spouse, Salomon, he is IT support for [employer]   3 sons   2 caffeinated beverages a day   No regular exercise, diet is ok   Right handed   Social Drivers of Corporate investment banker Strain: Low Risk  (07/18/2022)   Overall Financial Resource Strain (CARDIA)    Difficulty of Paying Living Expenses: Not hard at all  Food Insecurity: No Food Insecurity (01/19/2023)   Hunger Vital Sign    Worried About Running Out of Food in the  Last Year: Never true    Ran Out of Food in the Last Year: Never true  Transportation Needs: No Transportation Needs (01/22/2023)   PRAPARE - Administrator, Civil Service (Medical): No    Lack of Transportation (Non-Medical): No  Physical Activity: Inactive (07/18/2022)   Exercise Vital Sign    Days of Exercise per Week: 0 days    Minutes of Exercise per Session: 0 min  Stress: No Stress Concern Present (07/18/2022)   [employer] of Occupational Health - Occupational Stress Questionnaire    Feeling of Stress : Not at all  Social Connections: Socially Integrated (07/18/2022)   Social Connection and Isolation Panel    Frequency of Communication with Friends and Family: More than three times a week    Frequency of Social Gatherings with Friends and Family: More than three times a week    Attends Religious Services: More than 4 times per year    Active Member of [employer] or Organizations: Yes    Attends Engineer, structural: More than 4 times per year    Marital Status: Married    Current Medications:  Current Outpatient Medications:    acetaminophen  (TYLENOL ) 650 MG CR tablet, Take 1,300 mg by mouth every 8 (eight) hours as needed for pain., Disp: , Rfl:    amoxicillin  (AMOXIL ) 500 MG capsule, Take 4 capsules (2,000 mg total) by mouth as directed., Disp: 12 capsule, Rfl: 6   BIOTIN  PO, Take 1 tablet by mouth at bedtime., Disp: , Rfl:     Cholecalciferol  (VITAMIN D ) 125 MCG (5000 UT) CAPS, Take 5,000 Units by mouth in the morning., Disp: , Rfl:    ferrous sulfate  (FEROSUL) 325 (65 FE) MG tablet, Take 1 tablet (325 mg total) by mouth daily., Disp: 100 tablet, Rfl: 0   fluticasone  (FLONASE ) 50 MCG/ACT nasal spray, Place 1-2 sprays into both nostrils daily as needed for allergies or rhinitis., Disp: , Rfl:    furosemide  (LASIX ) 20 MG tablet, Take 3 tablets (60 mg total) by mouth 2 (two) times daily., Disp: 540 tablet, Rfl: 3   gabapentin  (NEURONTIN ) 100 MG capsule, TAKE 3 CAPSULES BY MOUTH IN THE MORNING AND 2 CAPSULES AT BEDTIME. OK TO TAKE EXTRA CAPSULE AT BEDTIME AS NEEDED, Disp: 540 capsule, Rfl: 3   hydroxychloroquine  (PLAQUENIL ) 200 MG tablet, Take 1 tablet (200 mg total) by mouth 2 (two) times daily with food or milk., Disp: 180 tablet, Rfl: 1   hydroxypropyl methylcellulose / hypromellose (ISOPTO TEARS / GONIOVISC) 2.5 % ophthalmic solution, Place 1 drop into both eyes in the morning., Disp: , Rfl:    losartan  (COZAAR ) 100 MG tablet, Take 1 tablet (100 mg total) by mouth daily., Disp: 30 tablet, Rfl: 0   Menthol , Topical Analgesic, (BIOFREEZE) 4 % GEL, Apply 1 application  topically at bedtime. Neuropathy feet/ankles, Disp: , Rfl:    metoprolol  succinate (TOPROL -XL) 50 MG 24 hr tablet, Take 1 tablet (50 mg total) by mouth 2 (two) times daily., Disp: 60 tablet, Rfl: 0   Multiple Vitamins-Minerals (MULTIVITAMIN WITH MINERALS) tablet, Take 2 tablets by mouth daily., Disp: , Rfl:    OVER THE COUNTER MEDICATION, Moringa 8,000mg  once daily, Disp: , Rfl:    OVER THE COUNTER MEDICATION, Vitamin B1 600mg  once daily, Disp: , Rfl:    potassium chloride  SA (KLOR-CON  M) 20 MEQ tablet, Take 1 tablet by mouth 2 times daily. Patient needs an appt, Disp: 180 tablet, Rfl: 0   prochlorperazine  (COMPAZINE ) 10 MG tablet, Take  1 tablet (10 mg) by mouth every 6 hours as needed for nausea or vomiting., Disp: 30 tablet, Rfl: 0   sulfaSALAzine  (AZULFIDINE )  500 MG tablet, Take 2 tablets (1,000 mg total) by mouth 2 (two) times daily., Disp: 360 tablet, Rfl: 1   vitamin C  (ASCORBIC ACID ) 500 MG tablet, Take 500 mg by mouth daily., Disp: , Rfl:   Review of Symptoms: Complete 10-system review is negative except as above in Interval History.  Physical Exam: BP 139/61 (BP Location: Left Arm, Patient Position: Sitting)   Pulse 66   Temp 97.8 F (36.6 C) (Oral)   Resp 19   Wt 283 lb (128.4 kg)   SpO2 94%   BMI 45.68 kg/m  General: Alert, oriented, no acute distress. HEENT: Normocephalic, atraumatic. Neck symmetric without masses. Sclera anicteric.  Chest: Normal work of breathing. Clear to auscultation bilaterally.   Cardiovascular: Regular rate and rhythm, no murmurs. Abdomen: Soft, nontender.  Normoactive bowel sounds.  No masses appreciated.   Extremities: Grossly normal range of motion.  Warm, well perfused.  No edema bilaterally. Skin: No rashes or lesions noted. Lymphatics: No cervical, supraclavicular, or inguinal adenopathy. GU: Normal appearing external genitalia without erythema, excoriation, or lesions.  Speculum exam reveals coaptation at vaginal apex with cervix adherent to the posterior vaginal wall, cervix incompletely visualized. What is visualized appears normal without nodularity or mass.  Bimanual exam reveals narrowing, adhesion at the vaginal apex. No nodularity or pelvic mass. Rectovaginal exam confirms above. Exam chaperoned by Kimberly [place]azil[place], CMA    Laboratory & Radiologic Studies: NM PET Image Restag (PS) Skull Base To Thigh 10/23/2023  Addendum 11/10/2023  8:13 AM ADDENDUM REPORT: 11/10/2023 08:10  ADDENDUM: Review of imaging at the [facility] tumor board on 11/10/2023 by Dr. Leonce demonstrates patchy hypermetabolism in the upper right sacrum on the 10/23/23 PET-CT, which is new from 07/07/23 PET-CT. No discrete osseous lesion or discrete fracture on the CT images. Per discussion at tumor board, the patient did have an  interval fall and reports pelvic pain. The imaging findings are nonspecific but could represent post-traumatic uptake from an occult right sacral fracture. Findings discussed with Dr. Lynwood Nasuti at tumor board. Any need for follow-up imaging should be based on clinical assessment.   Electronically Signed By: Selinda DELENA Leonce M.D. On: 11/10/2023 08:10  Narrative CLINICAL DATA:  Subsequent treatment strategy for cervical cancer.  EXAM: NUCLEAR MEDICINE PET SKULL BASE TO THIGH  TECHNIQUE: 13.9 mCi F-18 FDG was injected intravenously. Full-ring PET imaging was performed from the skull base to thigh after the radiotracer. CT data was obtained and used for attenuation correction and anatomic localization.  Fasting blood glucose: 115 mg/dl  COMPARISON:  Prior PET CTs 13 2024 and 07/07/2023.  FINDINGS: Mediastinal blood pool activity: SUV max 3.84  Liver activity: SUV max N/A  NECK: No hypermetabolic lymph nodes in the neck.  Incidental CT findings: Stable thyroid  goiter.  CHEST: No hypermetabolic mediastinal or hilar nodes. No suspicious pulmonary nodules on the CT scan.  Incidental CT findings: Stable scattered atherosclerotic calcifications involving the aorta. No aneurysm. Left atrial closure device noted. No worrisome pulmonary lesions or acute pulmonary findings on the CT scan. Images are degraded by breathing motion artifact.  ABDOMEN/PELVIS: No abnormal hypermetabolic activity within the liver, pancreas, adrenal glands, or spleen. No hypermetabolic lymph nodes in the abdomen or pelvis.  Mild residual hypermetabolism in the region of the cervix with SUV max 4.46, unchanged since the recent PET-CT. This could be radiation related.  Incidental CT findings: Stable scattered aortic calcifications. No abdominal or pelvic lymphadenopathy. The uterus and ovaries are unremarkable. No pelvic mass or free pelvic fluid collections.  SKELETON: No focal hypermetabolic  activity to suggest skeletal metastasis.  Incidental CT findings: None.  IMPRESSION: 1. Mild residual hypermetabolism in the region of the cervix, unchanged since the recent PET-CT. This could be radiation related. No findings suspicious for recurrent tumor or metastatic disease. 2. No findings for metastatic disease involving the neck, chest, abdomen/pelvis or bony structures. 3. Aortic atherosclerosis.  Electronically Signed: By: MYRTIS Stammer M.D. On: 11/06/2023 14:41

## 2024-03-15 NOTE — Patient Instructions (Signed)
 It was a pleasure to see you in clinic today. - Stable exam today - Pap smear collected - Pet scan ordered - Return visit planned for around december  Thank you very much for allowing me to provide care for you today.  I appreciate your confidence in choosing our Gynecologic Oncology team at [facility].  If you have any questions about your visit today please call our office or send us  a MyChart message and we will get back to you as soon as possible.

## 2024-03-16 ENCOUNTER — Other Ambulatory Visit (HOSPITAL_COMMUNITY): Payer: Self-pay

## 2024-03-16 MED ORDER — HYDROXYCHLOROQUINE SULFATE 200 MG PO TABS
200.0000 mg | ORAL_TABLET | Freq: Two times a day (BID) | ORAL | 1 refills | Status: AC
  Filled 2024-03-16 – 2024-03-18 (×2): qty 60, 30d supply, fill #0
  Filled 2024-03-22: qty 60, fill #0
  Filled 2024-03-28 – 2024-04-30 (×2): qty 60, 30d supply, fill #0
  Filled 2024-07-16 – 2024-07-19 (×2): qty 60, 30d supply, fill #1

## 2024-03-18 ENCOUNTER — Other Ambulatory Visit (HOSPITAL_COMMUNITY): Payer: Self-pay

## 2024-03-21 LAB — CYTOLOGY - PAP
Comment: NEGATIVE
Diagnosis: NEGATIVE
High risk HPV: NEGATIVE

## 2024-03-22 ENCOUNTER — Ambulatory Visit: Payer: Self-pay | Admitting: Psychiatry

## 2024-03-22 ENCOUNTER — Other Ambulatory Visit: Payer: Self-pay | Admitting: Family Medicine

## 2024-03-22 DIAGNOSIS — I10 Essential (primary) hypertension: Secondary | ICD-10-CM

## 2024-03-23 ENCOUNTER — Other Ambulatory Visit (HOSPITAL_COMMUNITY): Payer: Self-pay

## 2024-03-23 MED ORDER — METOPROLOL SUCCINATE ER 50 MG PO TB24
50.0000 mg | ORAL_TABLET | Freq: Two times a day (BID) | ORAL | 0 refills | Status: DC
  Filled 2024-03-23: qty 60, 30d supply, fill #0

## 2024-03-25 ENCOUNTER — Other Ambulatory Visit: Payer: Self-pay | Admitting: Family Medicine

## 2024-03-25 DIAGNOSIS — I10 Essential (primary) hypertension: Secondary | ICD-10-CM

## 2024-03-26 ENCOUNTER — Other Ambulatory Visit (HOSPITAL_COMMUNITY): Payer: Self-pay

## 2024-03-26 MED ORDER — LOSARTAN POTASSIUM 100 MG PO TABS
100.0000 mg | ORAL_TABLET | Freq: Every day | ORAL | 0 refills | Status: DC
  Filled 2024-03-26: qty 30, 30d supply, fill #0

## 2024-03-29 ENCOUNTER — Other Ambulatory Visit (HOSPITAL_COMMUNITY): Payer: Self-pay

## 2024-04-01 ENCOUNTER — Ambulatory Visit (INDEPENDENT_AMBULATORY_CARE_PROVIDER_SITE_OTHER): Admitting: Family Medicine

## 2024-04-01 ENCOUNTER — Other Ambulatory Visit (HOSPITAL_COMMUNITY): Payer: Self-pay

## 2024-04-01 ENCOUNTER — Ambulatory Visit: Admitting: Family Medicine

## 2024-04-01 DIAGNOSIS — Z0000 Encounter for general adult medical examination without abnormal findings: Secondary | ICD-10-CM

## 2024-04-01 NOTE — Progress Notes (Signed)
 Subjective:   [name] is a 73 y.o. female who presents for Medicare Annual (Subsequent) preventive examination.  Visit Complete: Virtual I connected with  [name] on 04/01/24 by a video and audio enabled telemedicine application and verified that I am speaking with the correct person using two identifiers.  Patient Location: Home  Provider Location: Office/Clinic  I discussed the limitations of evaluation and management by telemedicine. The patient expressed understanding and agreed to proceed.  Vital Signs: Because this visit was a virtual/telehealth visit, some criteria may be missing or patient reported. Any vitals not documented were not able to be obtained and vitals that have been documented are patient reported.  Patient Medicare AWV questionnaire was completed by the patient on 03/31/2024; I have confirmed that all information answered by patient is correct and no changes since this date.        Objective:    Today's Vitals   04/01/24 1542  PainSc: 3    There is no height or weight on file to calculate BMI.     04/01/2024    3:57 PM 01/19/2024   10:37 AM 10/30/2023    9:47 AM 05/23/2023   10:24 AM 05/05/2023   11:36 AM 04/02/2023    6:43 AM 03/26/2023    6:54 AM  Advanced Directives  Does Patient Have a Medical Advance Directive? No No No No No No No  Does patient want to make changes to medical advance directive?      No - Patient declined No - Patient declined  Would patient like information on creating a medical advance directive? No - Patient declined  No - Patient declined No - Patient declined No - Patient declined No - Patient declined     Current Medications (verified) Outpatient Encounter Medications as of 04/01/2024  Medication Sig   acetaminophen  (TYLENOL ) 650 MG CR tablet Take 1,300 mg by mouth every 8 (eight) hours as needed for pain.   amoxicillin  (AMOXIL ) 500 MG capsule Take 4 capsules (2,000 mg total) by mouth as directed.   BIOTIN  PO  Take 1 tablet by mouth at bedtime.   Cholecalciferol  (VITAMIN D ) 125 MCG (5000 UT) CAPS Take 5,000 Units by mouth in the morning.   ferrous sulfate  (FEROSUL) 325 (65 FE) MG tablet Take 1 tablet (325 mg total) by mouth daily.   fluticasone  (FLONASE ) 50 MCG/ACT nasal spray Place 1-2 sprays into both nostrils daily as needed for allergies or rhinitis.   furosemide  (LASIX ) 20 MG tablet Take 3 tablets (60 mg total) by mouth 2 (two) times daily.   gabapentin  (NEURONTIN ) 100 MG capsule TAKE 3 CAPSULES BY MOUTH IN THE MORNING AND 2 CAPSULES AT BEDTIME. OK TO TAKE EXTRA CAPSULE AT BEDTIME AS NEEDED   hydroxychloroquine  (PLAQUENIL ) 200 MG tablet Take 1 tablet by mouth with food or milk twice a day   hydroxypropyl methylcellulose / hypromellose (ISOPTO TEARS / GONIOVISC) 2.5 % ophthalmic solution Place 1 drop into both eyes in the morning.   losartan  (COZAAR ) 100 MG tablet Take 1 tablet (100 mg total) by mouth daily.   Menthol , Topical Analgesic, (BIOFREEZE) 4 % GEL Apply 1 application  topically at bedtime. Neuropathy feet/ankles   metoprolol  succinate (TOPROL -XL) 50 MG 24 hr tablet Take 1 tablet (50 mg total) by mouth 2 (two) times daily.   Multiple Vitamins-Minerals (MULTIVITAMIN WITH MINERALS) tablet Take 2 tablets by mouth daily.   OVER THE COUNTER MEDICATION Moringa 8,000mg  once daily   OVER THE COUNTER MEDICATION Vitamin B1  600mg  once daily   potassium chloride  SA (KLOR-CON  M) 20 MEQ tablet Take 1 tablet by mouth 2 times daily. Patient needs an appt   prochlorperazine  (COMPAZINE ) 10 MG tablet Take 1 tablet (10 mg) by mouth every 6 hours as needed for nausea or vomiting.   sulfaSALAzine  (AZULFIDINE ) 500 MG tablet Take 2 tablets (1,000 mg total) by mouth 2 (two) times daily.   vitamin C  (ASCORBIC ACID ) 500 MG tablet Take 500 mg by mouth daily.   No facility-administered encounter medications on file as of 04/01/2024.    Allergies (verified) Amlodipine  besylate, Divalproex  sodium, Nortriptyline ,  Hydromorphone  hcl, Other, Rosuvastatin , and Tramadol    History: Past Medical History:  Diagnosis Date   Allergy    Cataract    BILATERAL-REMOVED 2 YEARS AGO   Cervical cancer (HCC) 2024   COVID 09/2021   Encounter for blood transfusion 01/19/2023   2 units given   Erythromelalgia (HCC)    followed by neuro Dr Tobie , mgd on gabapentin  , dx several years ago    GERD (gastroesophageal reflux disease)    Helicobacter pylori gastritis 10/05/2018   History of atrial fibrillation 09/2022   s/p watchman implantation   History of radiation therapy    Cervix- 01/14/23-04/02/23-Dr. 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 Cindie   Pseudogout of foot    Rheumatoid arthritis(714.0) dx 2010   Shingles    hx of    Tibial plateau fracture, right, closed, initial encounter 07/16/2017   Past Surgical History:  Procedure Laterality Date   ATRIAL FIBRILLATION ABLATION N/A 04/16/2022   Procedure: ATRIAL FIBRILLATION ABLATION;  Surgeon: Cindie Ole DASEN, MD;  Location: [facility];  Service: Cardiovascular;  Laterality: N/A;   BREAST BIOPSY  1972   Broken wrist Bilateral 2010   CARDIOVERSION N/A 11/22/2021   Procedure: CARDIOVERSION;  Surgeon: Barbaraann Darryle Ned, MD;  Location: [facility] ENDOSCOPY;  Service: Cardiovascular;  Laterality: N/A;   CATARACT EXTRACTION Bilateral 03/2012   COLONOSCOPY     COSMETIC SURGERY     breast   FRACTURE SURGERY      INNER EAR SURGERY     busted ear drum   LEFT ATRIAL APPENDAGE OCCLUSION N/A 10/03/2022   Procedure: LEFT ATRIAL APPENDAGE OCCLUSION;  Surgeon: Cindie Ole DASEN, MD;  Location: [facility];  Service: Cardiovascular;  Laterality: N/A;   MAXIMUM ACCESS (MAS)POSTERIOR LUMBAR INTERBODY FUSION (PLIF) 1 LEVEL N/A 10/11/2016   Procedure: Lumbar one-Sacral one Maximum access posterior lumbar interbody fusion;  Surgeon: Fairy Levels, MD;  Location: [facility] OR;  Service: Neurosurgery;  Laterality: N/A;   OPERATIVE ULTRASOUND N/A 03/05/2023   Procedure: OPERATIVE ULTRASOUND;  Surgeon: Nasuti Lynwood, MD;  Location: [facility];  Service: Urology;  Laterality: N/A;   OPERATIVE ULTRASOUND N/A 03/13/2023   Procedure: OPERATIVE ULTRASOUND;  Surgeon: Nasuti Lynwood,  MD;  Location: [facility];  Service: Urology;  Laterality: N/A;   OPERATIVE ULTRASOUND N/A 03/20/2023   Procedure: OPERATIVE ULTRASOUND;  Surgeon: Shannon Agent, MD;  Location: [facility];  Service: Urology;  Laterality: N/A;   OPERATIVE ULTRASOUND N/A 03/26/2023   Procedure: OPERATIVE ULTRASOUND;  Surgeon: Shannon Agent, MD;  Location: [facility];  Service: Urology;  Laterality: N/A;   OPERATIVE ULTRASOUND N/A 04/02/2023   Procedure: OPERATIVE ULTRASOUND;  Surgeon: Shannon Agent, MD;  Location: [facility];  Service: Urology;  Laterality: N/A;   ORIF WRIST FRACTURE Right 07/18/2017   Procedure: OPEN REDUCTION INTERNAL FIXATION (ORIF) WRIST FRACTURE;  Surgeon: Beverley Evalene BIRCH, MD;  Location: [facility] OR;  Service: Orthopedics;  Laterality: Right;   PARATHYROIDECTOMY Left 04/12/2019   Procedure: LEFT SUPERIOR PARATHYROIDECTOMY;  Surgeon: Eletha Boas, MD;  Location: [facility] ORS;  Service: General;  Laterality: Left;   TANDEM RING INSERTION N/A 03/05/2023   Procedure: TANDEM RING INSERTION;  Surgeon: Shannon Agent, MD;  Location: [facility];  Service: Urology;   Laterality: N/A;   TANDEM RING INSERTION N/A 03/13/2023   Procedure: TANDEM RING INSERTION;  Surgeon: Shannon Agent, MD;  Location: [facility];  Service: Urology;  Laterality: N/A;   TANDEM RING INSERTION N/A 03/20/2023   Procedure: TANDEM RING INSERTION;  Surgeon: Shannon Agent, MD;  Location: [facility];  Service: Urology;  Laterality: N/A;   TANDEM RING INSERTION N/A 03/26/2023   Procedure: TANDEM RING INSERTION;  Surgeon: Shannon Agent, MD;  Location: [facility];  Service: Urology;  Laterality: N/A;   TANDEM RING INSERTION N/A 04/02/2023   Procedure: TANDEM RING INSERTION;  Surgeon: Shannon Agent, MD;  Location: [facility];  Service: Urology;  Laterality: N/A;   TEE WITHOUT CARDIOVERSION N/A 10/03/2022   Procedure: TRANSESOPHAGEAL ECHOCARDIOGRAM (TEE);  Surgeon: Cindie Ole DASEN, MD;  Location: [facility] INVASIVE CV LAB;  Service: Cardiovascular;  Laterality: N/A;   watchman implantation  10/03/2022   Family History  Problem Relation Age of Onset   Stroke Mother    Hypertension Father    Heart attack Father    Hypertension Sister        x 3   Hyperthyroidism Sister        x2, s/p RAI ablation   Liver cancer Sister    Hypertension Brother    Hypothyroidism Brother    Colon cancer Neg Hx    Esophageal cancer Neg Hx    Rectal cancer Neg Hx    Stomach cancer Neg Hx    Breast cancer Neg Hx    Ovarian cancer Neg Hx    Endometrial cancer Neg Hx    Pancreatic cancer Neg Hx    Prostate cancer Neg Hx    Social History   Socioeconomic History   Marital status: Married    Spouse name: Not on file   Number of children: 3   Years of education: Not on file   Highest education level: 11th grade  Occupational History   Occupation: Retired  Tobacco Use   Smoking status: Former    Current packs/day: 0.00    Average packs/day: 4.0 packs/day for 4.0 years (16.0 ttl pk-yrs)    Types: Cigarettes    Start date: 60    Quit date:  1985    Years since quitting: 40.6   Smokeless tobacco: Never  Vaping Use   Vaping status: Never Used  Substance and Sexual Activity   Alcohol  use: No    Alcohol/week: 0.0 standard drinks of alcohol   Drug use: No   Sexual activity: Not Currently  Other Topics Concern   Not on file  Social History Narrative   Artist -retired Designer, fashion/clothing   Married, lives with spouse, Salomon, he is IT support for [employer]   3 sons   2 caffeinated beverages a day   No regular exercise, diet is ok   Right handed   Social Drivers of Corporate investment banker Strain: Low Risk  (03/31/2024)   Overall Financial Resource Strain (CARDIA)    Difficulty of Paying Living Expenses: Not hard at all  Food Insecurity: No Food Insecurity (03/31/2024)   Hunger Vital Sign    Worried About Running Out of Food in the Last Year: Never true    Ran Out of Food in the Last Year: Never true  Transportation Needs: No Transportation Needs (03/31/2024)   PRAPARE - Administrator, Civil Service (Medical): No    Lack of Transportation (Non-Medical): No  Physical Activity: Inactive (03/31/2024)   Exercise Vital Sign    Days of Exercise per Week: 0 days    Minutes of Exercise per Session: Not on file  Stress: No Stress Concern Present (03/31/2024)   [employer] of Occupational Health - Occupational Stress Questionnaire    Feeling of Stress: Not at all  Social Connections: Unknown (03/31/2024)   Social Connection and Isolation Panel    Frequency of Communication with Friends and Family: More than three times a week    Frequency of Social Gatherings with Friends and Family: More than three times a week    Attends Religious Services: Not on Insurance claims handler of Clubs or Organizations: No    Attends Banker Meetings: Not on file    Marital Status: Married    Tobacco Counseling Counseling given: Not Answered Pt is a former smoker, quit in 1985  Clinical  Intake:  Pre-visit preparation completed: Yes (03/31/2024)  Pain : 0-10 Pain Score: 3  Pain Type: Chronic pain, Neuropathic pain Pain Location: Foot Pain Descriptors / Indicators: Burning Pain Onset: Other (comment) Pain Frequency: Several days a week     Nutritional Risks: None  How often do you need to have someone help you when you read instructions, pamphlets, or other written materials from your doctor or pharmacy?: 1 - Never  Interpreter Needed?: No      Activities of Daily Living    03/31/2024    8:28 AM  In your present state of health, do you have any difficulty performing the following activities:  Hearing? 0  Vision? 0  Difficulty concentrating or making decisions? 0  Walking or climbing stairs? 0  Dressing or bathing? 0  Doing errands, shopping? 0  Preparing Food and eating ? N  Using the Toilet? N  In the past six months, have you accidently leaked urine? N  Do you have problems with loss of bowel control? N  Managing your Medications? N  Managing your Finances? N  Housekeeping or managing your Housekeeping? N    Patient Care Team: [name] HERO, MD as PCP - General (Family Medicine) Cindie Ole DASEN, MD as PCP - Electrophysiology (Cardiology) Floy Lynwood PARAS, MD (Inactive) (Otolaryngology) Carrie Dunnings, MD (Ophthalmology) Mai Lynwood FALCON, MD as Consulting Physician (Rheumatology) Patel, Donika K, DO as Consulting Physician (Neurology) Trixie File, MD as Consulting Physician (Internal Medicine)  Indicate any recent Medical Services you  may have received from other than [facility] providers in the past year (date may be approximate).     Assessment:   This is a routine wellness examination for [place].  Hearing/Vision screen No results found.   Goals Addressed               This Visit's Progress     Patient stated (pt-stated)        Get out in my yard and work, pt has a lot of chronic back pain and chronic neuropathy, thinks  she is doing better than previous but has room to improve.       Depression Screen    04/01/2024    3:55 PM 12/31/2022   11:07 AM 11/29/2022   10:12 AM 07/18/2022   12:59 PM 02/13/2022   12:49 PM 01/10/2021    1:03 PM 05/28/2018    1:57 PM  PHQ 2/9 Scores  PHQ - 2 Score 0 0 0 0 0 0 0  PHQ- 9 Score     0 0     Fall Risk    03/31/2024    8:28 AM 01/19/2024   10:37 AM 01/08/2023    8:31 AM 07/18/2022    1:00 PM 04/01/2022   11:17 AM  Fall Risk   Falls in the past year? 0 0 0 0 0  Number falls in past yr:  0 0 0 0  Injury with Fall?  0 0 0 0  Risk for fall due to :    No Fall Risks No Fall Risks  Follow up  Falls evaluation completed Falls evaluation completed Falls prevention discussed  Falls evaluation completed      Data saved with a previous flowsheet row definition    MEDICARE RISK AT HOME: Medicare Risk at Home Any stairs in or around the home?: (Patient-Rptd) Yes If so, are there any without handrails?: (Patient-Rptd) No Home free of loose throw rugs in walkways, pet beds, electrical cords, etc?: (Patient-Rptd) Yes Adequate lighting in your home to reduce risk of falls?: (Patient-Rptd) Yes Life alert?: (Patient-Rptd) No Use of a cane, walker or w/c?: (Patient-Rptd) Yes Grab bars in the bathroom?: (Patient-Rptd) Yes Shower chair or bench in shower?: (Patient-Rptd) Yes Elevated toilet seat or a handicapped toilet?: (Patient-Rptd) No  TIMED UP AND GO:  Was the test performed?  No    Cognitive Function:        04/01/2024    3:46 PM 07/18/2022    1:01 PM  6CIT Screen  What Year? 0 points 0 points  What month? 0 points 0 points  What time? 0 points 0 points  Count back from 20 0 points 0 points  Months in reverse 0 points 0 points  Repeat phrase 0 points 0 points  Total Score 0 points 0 points    Immunizations Immunization History  Administered Date(s) Administered   Fluad Quad(high Dose 65+) 05/22/2019   Influenza, High Dose Seasonal PF 05/31/2016,  07/04/2017, 06/04/2018   Influenza,inj,Quad PF,6+ Mos 09/15/2014, 05/04/2015   Pneumococcal Conjugate-13 09/08/2015   Pneumococcal Polysaccharide-23 04/09/2011, 09/12/2016   Tdap 05/31/2016    TDAP status: Up to date  Flu Vaccine status: Due, Education has been provided regarding the importance of this vaccine. Advised may receive this vaccine at local pharmacy or Health Dept. Aware to provide a copy of the vaccination record if obtained from local pharmacy or Health Dept. Verbalized acceptance and understanding.  Pneumococcal vaccine status: Up to date  Covid-19 vaccine status: Completed vaccines  Qualifies  for Shingles Vaccine? Yes   Zostavax completed No   Shingrix Completed?: No.    Education has been provided regarding the importance of this vaccine. Patient has been advised to call insurance company to determine out of pocket expense if they have not yet received this vaccine. Advised may also receive vaccine at local pharmacy or Health Dept. Verbalized acceptance and understanding.  Screening Tests Health Maintenance  Topic Date Due   COVID-19 Vaccine (1) Never done   Zoster Vaccines- Shingrix (1 of 2) Never done   INFLUENZA VACCINE  03/19/2024   Colonoscopy  04/01/2025 (Originally 10/01/2023)   Medicare Annual Wellness (AWV)  04/01/2025   DTaP/Tdap/Td (2 - Td or Tdap) 05/31/2026   Pneumococcal Vaccine: 50+ Years  Completed   DEXA SCAN  Completed   Hepatitis C Screening  Completed   HPV VACCINES  Aged Out   Meningococcal B Vaccine  Aged Out   Pneumococcal Vaccine  Discontinued    Health Maintenance  Health Maintenance Due  Topic Date Due   COVID-19 Vaccine (1) Never done   Zoster Vaccines- Shingrix (1 of 2) Never done   INFLUENZA VACCINE  03/19/2024    Colorectal cancer screening: Type of screening: Colonoscopy. Completed 2020. Repeat every 5 years, pt declined referral to gastroenterology  Mammogram status: No longer required due to age. Offered to patient but  she declined this as well  Bone Density status: Completed 2023. Results reflect: Bone density results: NORMAL. Repeat every 2 years.  Lung Cancer Screening: (Low Dose CT Chest recommended if Age 40-80 years, 20 pack-year currently smoking OR have quit w/in 15years.) does not qualify.   Lung Cancer Screening Referral: N/A  Additional Screening:  Hepatitis C Screening: does qualify; Completed to be completed at next visit  Vision Screening: Recommended annual ophthalmology exams for early detection of glaucoma and other disorders of the eye. Is the patient up to date with their annual eye exam?  Yes  Who is the provider or what is the name of the office in which the patient attends annual eye exams? [facility] If pt is not established with a provider, would they like to be referred to a provider to establish care? No .   Dental Screening: Recommended annual dental exams for proper oral hygiene, patient sees her dentist regularly.   Community Resource Referral / Chronic Care Management: CRR required this visit?  No   CCM required this visit?  No     Plan:     I have personally reviewed and noted the following in the patient's chart:   Medical and social history Use of alcohol, tobacco or illicit drugs  Current medications and supplements including opioid prescriptions. Patient is not currently taking opioid prescriptions. Functional ability and status Nutritional status Physical activity Advanced directives List of other physicians Hospitalizations, surgeries, and ER visits in previous 12 months Vitals Screenings to include cognitive, depression, and falls Referrals and appointments  In addition, I have reviewed and discussed with patient certain preventive protocols, quality metrics, and best practice recommendations. A written personalized care plan for preventive services as well as general preventive health recommendations were provided to patient.      Heron CHRISTELLA Sharper, MD   04/01/2024   After Visit Summary: (MyChart) Due to this being a telephonic visit, the after visit summary with patients personalized plan was offered to patient via MyChart

## 2024-04-08 ENCOUNTER — Encounter (HOSPITAL_COMMUNITY)
Admission: RE | Admit: 2024-04-08 | Discharge: 2024-04-08 | Disposition: A | Discharge status: Home / Self Care | Source: Ambulatory Visit | Attending: Psychiatry | Admitting: Psychiatry

## 2024-04-08 DIAGNOSIS — C538 Malignant neoplasm of overlapping sites of cervix uteri: Secondary | ICD-10-CM | POA: Diagnosis present

## 2024-04-08 LAB — GLUCOSE, CAPILLARY: Glucose-Capillary: 106 mg/dL — ABNORMAL HIGH (ref 70–99)

## 2024-04-08 MED ORDER — FLUDEOXYGLUCOSE F - 18 (FDG) INJECTION
14.1500 | Freq: Once | INTRAVENOUS | Status: AC
  Administered 2024-04-08: 14.05 via INTRAVENOUS

## 2024-04-12 ENCOUNTER — Other Ambulatory Visit (HOSPITAL_COMMUNITY): Payer: Self-pay

## 2024-04-12 ENCOUNTER — Other Ambulatory Visit: Payer: Self-pay

## 2024-04-13 ENCOUNTER — Other Ambulatory Visit: Payer: Self-pay

## 2024-04-22 ENCOUNTER — Other Ambulatory Visit: Payer: Self-pay | Admitting: Family Medicine

## 2024-04-22 ENCOUNTER — Other Ambulatory Visit (HOSPITAL_COMMUNITY): Payer: Self-pay

## 2024-04-22 ENCOUNTER — Other Ambulatory Visit: Payer: Self-pay

## 2024-04-22 DIAGNOSIS — I10 Essential (primary) hypertension: Secondary | ICD-10-CM

## 2024-04-22 MED ORDER — LOSARTAN POTASSIUM 100 MG PO TABS
100.0000 mg | ORAL_TABLET | Freq: Every day | ORAL | 0 refills | Status: DC
  Filled 2024-04-22: qty 30, 30d supply, fill #0

## 2024-04-22 MED ORDER — METOPROLOL SUCCINATE ER 50 MG PO TB24
50.0000 mg | ORAL_TABLET | Freq: Two times a day (BID) | ORAL | 0 refills | Status: DC
  Filled 2024-04-22: qty 60, 30d supply, fill #0

## 2024-04-28 NOTE — Progress Notes (Signed)
 Radiation Oncology         [phone] ________________________________  Name: [name] MRN: [id number]  Date: 04/29/2024  DOB: 04-08-1951  Follow-Up Visit Note  CC: [name] CHRISTELLA, MD  [name] CHRISTELLA, MD  No diagnosis found.  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     Cancer Staging  Cervical cancer [facility]) Staging form: Cervix Uteri, AJCC Version 9 - Clinical stage from 12/24/2022: FIGO Stage IIIC2 (cT2b, cN2a, cM0) - Signed by Lonn Hicks, MD on 12/31/2022   Interval Since Last Radiation: 1 year 29 days    Indication for treatment: Curative        Radiation treatment dates: 01/14/23 through 04/02/23 (Pelvic IMRT - 01/14/23 through 02/24/23) (Cervix Brachytherapy - 5 treatment sessions delivered delivered from 03/05/23 through 04/02/23) Site/dose:    1) Pelvis - 45 Gy delivered in 25 Fx at 1.8 Gy/Fx 2) Pelvis boost - 9 Gy delivered in 5 Fx at 1.8 Gy/Fx 3) Cervix (brachytherapy) - 27.5 Gy delivered in 5 Fx at 5.5 Gy/Fx Technique/Mode:  1&2) IMRT / Photon  3) HDR Ir-192 / Brachytherapy  Beams/energy:    1&2) 6X 3) GMP Ir-192 HDR   Narrative:  The patient returns today for routine follow-up. She was last seen in office on 10/30/23 for a follow-up. Patient continued to follow up with their specialists to manage their chronic conditions.    Patient case was presented to the tumor board on 11/10/23 with the disposition to no further imaging unless patient has symptoms.   She presented for a follow up with Dr. Eldonna on 03/15/24 where she was noted NED on examination and underwent a yearly PAP exam. Pathology was negative for any intraepithelial lesion or malignancy.   She underwent a restaging PET on 04/08/24 showing no evidence of metabolically active residual or recurrent cervical cancer or metastatic disease.   No other significant oncologic interval  history since the patient was last seen.                             Allergies:  is allergic to amlodipine  besylate, divalproex  sodium, nortriptyline , hydromorphone  hcl, other, rosuvastatin , and tramadol .  Meds: Current Outpatient Medications  Medication Sig Dispense Refill   acetaminophen  (TYLENOL ) 650 MG CR tablet Take 1,300 mg by mouth every 8 (eight) hours as needed for pain.     amoxicillin  (AMOXIL ) 500 MG capsule Take 4 capsules (2,000 mg total) by mouth as directed. 12 capsule 6   BIOTIN  PO Take 1 tablet by mouth at bedtime.     Cholecalciferol  (VITAMIN D ) 125 MCG (5000 UT) CAPS Take 5,000 Units by mouth in the morning.     ferrous sulfate  (FEROSUL) 325 (65 FE) MG tablet Take 1 tablet (325 mg total) by mouth daily. 100 tablet 0   fluticasone  (FLONASE ) 50 MCG/ACT nasal spray Place 1-2 sprays into both nostrils daily as needed for allergies or rhinitis.     furosemide  (LASIX ) 20 MG tablet Take 3 tablets (60 mg total) by mouth 2 (two) times daily. 540 tablet 3   gabapentin  (NEURONTIN ) 100 MG capsule TAKE 3 CAPSULES BY MOUTH IN THE MORNING AND 2 CAPSULES AT BEDTIME. OK TO TAKE EXTRA CAPSULE AT BEDTIME AS NEEDED 540 capsule 3   hydroxychloroquine  (PLAQUENIL ) 200 MG tablet Take 1 tablet by mouth with food or milk twice a day 60 tablet 1   hydroxypropyl  methylcellulose / hypromellose (ISOPTO TEARS / GONIOVISC) 2.5 % ophthalmic solution Place 1 drop into both eyes in the morning.     losartan  (COZAAR ) 100 MG tablet Take 1 tablet (100 mg total) by mouth daily. 30 tablet 0   Menthol , Topical Analgesic, (BIOFREEZE) 4 % GEL Apply 1 application  topically at bedtime. Neuropathy feet/ankles     metoprolol  succinate (TOPROL -XL) 50 MG 24 hr tablet Take 1 tablet (50 mg total) by mouth 2 (two) times daily. 60 tablet 0   Multiple Vitamins-Minerals (MULTIVITAMIN WITH MINERALS) tablet Take 2 tablets by mouth daily.     OVER THE COUNTER MEDICATION [place] 8,000mg  once daily     OVER THE COUNTER MEDICATION  Vitamin B1 600mg  once daily     potassium chloride  SA (KLOR-CON  M) 20 MEQ tablet Take 1 tablet by mouth 2 times daily. Patient needs an appt 180 tablet 0   prochlorperazine  (COMPAZINE ) 10 MG tablet Take 1 tablet (10 mg) by mouth every 6 hours as needed for nausea or vomiting. 30 tablet 0   sulfaSALAzine  (AZULFIDINE ) 500 MG tablet Take 2 tablets (1,000 mg total) by mouth 2 (two) times daily. 360 tablet 1   vitamin C  (ASCORBIC ACID ) 500 MG tablet Take 500 mg by mouth daily.     No current facility-administered medications for this encounter.    Physical Findings: The patient is in no acute distress. Patient is alert and oriented.  vitals were not taken for this visit. .  No significant changes. Lungs are clear to auscultation bilaterally. Heart has regular rate and rhythm. No palpable cervical, supraclavicular, or axillary adenopathy. Abdomen soft, non-tender, normal bowel sounds.   Lab Findings: Lab Results  Component Value Date   WBC 4.1 03/05/2023   HGB 11.7 (L) 03/05/2023   HCT 34.1 (L) 03/05/2023   MCV 89.7 03/05/2023   PLT 154 03/05/2023    Radiographic Findings: NM PET Image Restage (PS) Skull Base to Thigh (F-18 FDG) Result Date: 04/17/2024 EXAM: PET AND CT SKULL BASE TO MID THIGH 04/08/2024 11:23:18 AM TECHNIQUE: PET imaging was acquired from the base of the skull to the mid thighs. Non-contrast enhanced computed tomography was obtained for attenuation correction and anatomic localization. RADIOPHARMACEUTICAL: 14.05 mCi F-18 FDG Uptake time 60 minutes. Glucose level 106 mg/dl. COMPARISON: 10/23/2023 CLINICAL HISTORY: Cervical cancer, monitor. Not diabetic; no surgeries; no biopsies; had radiation last year; no OBMD; EOV. FINDINGS: HEAD AND NECK: No tracer avid lymph nodes within the soft tissues of the neck. CHEST: No tracer avid mediastinal, hilar, or axillary lymph nodes. No tracer avid pulmonary nodule or mass. Coronary artery calcification. Aortic atherosclerosis. Left atrial  appendage occluder. ABDOMEN AND PELVIS: No abnormal tracer uptake within the liver, pancreas, spleen, or adrenal glands. No hypermetabolic lymph nodes within the abdomen. No abnormal focal increased tracer uptake identified within the cervix. No tracer avid pelvic or inguinal lymph nodes. No ascites. No peritoneal nodularity. BONES AND SOFT TISSUE: No signs of hypermetabolic bone metastases. Increased sclerosis compatible with sacral insufficiency fractures, best seen on axial image 159. Lumbar spine hardware fixation. IMPRESSION: 1. No evidence of metabolically active residual or recurrent cervical cancer or metastatic disease. 2. Signs of bilateral sacral insufficiency fractures with increased radiotracer uptake within both sacral wings. 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  ***  Plan:  ***   *** minutes of total time was spent for this patient encounter, including preparation, face-to-face counseling with the patient and coordination of care, physical exam, and documentation of the encounter. ____________________________________  Lynwood CHARM Nasuti, PhD, MD  This document serves as a record of services personally performed by Lynwood Nasuti, MD. It was created on his behalf by Reymundo Cartwright, a trained medical scribe. The creation of this record is based on the scribe's personal observations and the provider's statements to them. This document has been checked and approved by the attending provider.

## 2024-04-29 ENCOUNTER — Encounter: Payer: Self-pay | Admitting: Radiation Oncology

## 2024-04-29 ENCOUNTER — Ambulatory Visit
Admission: RE | Admit: 2024-04-29 | Discharge: 2024-04-29 | Disposition: A | Discharge status: Home / Self Care | Source: Ambulatory Visit | Attending: Radiation Oncology | Admitting: Radiation Oncology

## 2024-04-29 VITALS — BP 195/89 | HR 76 | Temp 97.5°F | Resp 18 | Ht 66.0 in | Wt 285.2 lb

## 2024-04-29 DIAGNOSIS — C538 Malignant neoplasm of overlapping sites of cervix uteri: Secondary | ICD-10-CM | POA: Insufficient documentation

## 2024-04-29 DIAGNOSIS — Z79899 Other long term (current) drug therapy: Secondary | ICD-10-CM | POA: Insufficient documentation

## 2024-04-29 DIAGNOSIS — Z923 Personal history of irradiation: Secondary | ICD-10-CM | POA: Diagnosis not present

## 2024-04-29 NOTE — Progress Notes (Signed)
 [name] is here today for follow up post radiation to the pelvic.  They completed their radiation on: 04/02/23   Does the patient complain of any of the following:  Pain: Yes reports back pain  and right side abdominal pain.  Abdominal bloating:  No Diarrhea/Constipation: No Nausea/Vomiting: No Vaginal Discharge: No Blood in Urine or Stool: No Urinary Issues (dysuria/incomplete emptying/ incontinence/ increased frequency/urgency): Yes, reports intermittent dysuria.  Does patient report using vaginal dilator 2-3 times a week and/or sexually active 2-3 weeks: Yes, patient has not used in 2 weeks.  Post radiation skin changes: No    Additional comments if applicable: last pap 03/15/24.   BP (!) 195/89   Pulse 76   Temp (!) 97.5 F (36.4 C)   Resp 18   Ht 5' 6 (1.676 m)   Wt 285 lb 4 oz (129.4 kg)   SpO2 97%   BMI 46.04 kg/m

## 2024-04-30 ENCOUNTER — Other Ambulatory Visit: Payer: Self-pay

## 2024-04-30 ENCOUNTER — Other Ambulatory Visit (HOSPITAL_COMMUNITY): Payer: Self-pay

## 2024-05-12 ENCOUNTER — Other Ambulatory Visit (HOSPITAL_BASED_OUTPATIENT_CLINIC_OR_DEPARTMENT_OTHER): Payer: Self-pay

## 2024-05-12 DIAGNOSIS — I7381 Erythromelalgia: Secondary | ICD-10-CM | POA: Diagnosis not present

## 2024-05-12 DIAGNOSIS — M0609 Rheumatoid arthritis without rheumatoid factor, multiple sites: Secondary | ICD-10-CM | POA: Diagnosis not present

## 2024-05-12 DIAGNOSIS — M5136 Other intervertebral disc degeneration, lumbar region with discogenic back pain only: Secondary | ICD-10-CM | POA: Diagnosis not present

## 2024-05-12 MED ORDER — MELOXICAM 15 MG PO TABS
15.0000 mg | ORAL_TABLET | Freq: Every day | ORAL | 3 refills | Status: DC
  Filled 2024-05-12: qty 30, 30d supply, fill #0

## 2024-05-12 MED ORDER — SULFASALAZINE 500 MG PO TABS
1000.0000 mg | ORAL_TABLET | Freq: Two times a day (BID) | ORAL | 1 refills | Status: AC
  Filled 2024-05-12 – 2024-07-07 (×2): qty 360, 90d supply, fill #0

## 2024-05-12 MED ORDER — HYDROXYCHLOROQUINE SULFATE 200 MG PO TABS
200.0000 mg | ORAL_TABLET | Freq: Two times a day (BID) | ORAL | 1 refills | Status: DC
  Filled 2024-05-12 – 2024-07-17 (×2): qty 180, 90d supply, fill #0

## 2024-05-17 ENCOUNTER — Other Ambulatory Visit: Payer: Self-pay | Admitting: Physician Assistant

## 2024-05-17 ENCOUNTER — Other Ambulatory Visit: Payer: Self-pay | Admitting: Family Medicine

## 2024-05-17 DIAGNOSIS — R600 Localized edema: Secondary | ICD-10-CM

## 2024-05-17 DIAGNOSIS — I10 Essential (primary) hypertension: Secondary | ICD-10-CM

## 2024-05-18 ENCOUNTER — Other Ambulatory Visit (HOSPITAL_COMMUNITY): Payer: Self-pay

## 2024-05-18 ENCOUNTER — Other Ambulatory Visit: Payer: Self-pay

## 2024-05-18 MED ORDER — LOSARTAN POTASSIUM 100 MG PO TABS
100.0000 mg | ORAL_TABLET | Freq: Every day | ORAL | 0 refills | Status: DC
  Filled 2024-05-18: qty 30, 30d supply, fill #0

## 2024-05-19 ENCOUNTER — Other Ambulatory Visit (HOSPITAL_COMMUNITY): Payer: Self-pay

## 2024-05-19 MED ORDER — FUROSEMIDE 20 MG PO TABS
60.0000 mg | ORAL_TABLET | Freq: Two times a day (BID) | ORAL | 0 refills | Status: DC
  Filled 2024-05-19: qty 180, 30d supply, fill #0

## 2024-05-20 ENCOUNTER — Other Ambulatory Visit (HOSPITAL_COMMUNITY): Payer: Self-pay

## 2024-05-20 ENCOUNTER — Other Ambulatory Visit: Payer: Self-pay | Admitting: Family Medicine

## 2024-05-20 DIAGNOSIS — I10 Essential (primary) hypertension: Secondary | ICD-10-CM

## 2024-05-21 ENCOUNTER — Other Ambulatory Visit (HOSPITAL_COMMUNITY): Payer: Self-pay

## 2024-05-21 ENCOUNTER — Encounter (HOSPITAL_COMMUNITY): Payer: Self-pay

## 2024-05-25 ENCOUNTER — Other Ambulatory Visit: Payer: Self-pay | Admitting: Family Medicine

## 2024-05-25 DIAGNOSIS — I10 Essential (primary) hypertension: Secondary | ICD-10-CM

## 2024-05-26 ENCOUNTER — Other Ambulatory Visit (HOSPITAL_COMMUNITY): Payer: Self-pay

## 2024-05-26 MED ORDER — METOPROLOL SUCCINATE ER 50 MG PO TB24
50.0000 mg | ORAL_TABLET | Freq: Two times a day (BID) | ORAL | 0 refills | Status: DC
  Filled 2024-05-26: qty 60, 30d supply, fill #0

## 2024-06-15 ENCOUNTER — Other Ambulatory Visit: Payer: Self-pay | Admitting: Physician Assistant

## 2024-06-15 DIAGNOSIS — R600 Localized edema: Secondary | ICD-10-CM

## 2024-06-17 ENCOUNTER — Encounter (HOSPITAL_COMMUNITY): Payer: Self-pay

## 2024-06-17 ENCOUNTER — Other Ambulatory Visit (HOSPITAL_COMMUNITY): Payer: Self-pay

## 2024-06-18 ENCOUNTER — Other Ambulatory Visit: Payer: Self-pay | Admitting: Family Medicine

## 2024-06-18 ENCOUNTER — Other Ambulatory Visit: Payer: Self-pay | Admitting: Physician Assistant

## 2024-06-18 ENCOUNTER — Other Ambulatory Visit (HOSPITAL_COMMUNITY): Payer: Self-pay

## 2024-06-18 DIAGNOSIS — I10 Essential (primary) hypertension: Secondary | ICD-10-CM

## 2024-06-18 DIAGNOSIS — R600 Localized edema: Secondary | ICD-10-CM

## 2024-06-18 MED ORDER — METOPROLOL SUCCINATE ER 50 MG PO TB24
50.0000 mg | ORAL_TABLET | Freq: Two times a day (BID) | ORAL | 0 refills | Status: DC
  Filled 2024-06-18: qty 60, 30d supply, fill #0

## 2024-06-19 ENCOUNTER — Other Ambulatory Visit: Payer: Self-pay

## 2024-06-20 ENCOUNTER — Other Ambulatory Visit: Payer: Self-pay | Admitting: Family Medicine

## 2024-06-20 DIAGNOSIS — I10 Essential (primary) hypertension: Secondary | ICD-10-CM

## 2024-06-21 ENCOUNTER — Encounter: Payer: Self-pay | Admitting: Radiology

## 2024-06-21 ENCOUNTER — Other Ambulatory Visit (HOSPITAL_COMMUNITY): Payer: Self-pay

## 2024-06-21 ENCOUNTER — Other Ambulatory Visit: Payer: Self-pay | Admitting: Physician Assistant

## 2024-06-21 ENCOUNTER — Other Ambulatory Visit: Payer: Self-pay

## 2024-06-21 DIAGNOSIS — R600 Localized edema: Secondary | ICD-10-CM

## 2024-06-21 MED ORDER — LOSARTAN POTASSIUM 100 MG PO TABS
100.0000 mg | ORAL_TABLET | Freq: Every day | ORAL | 0 refills | Status: DC
  Filled 2024-06-21: qty 90, 90d supply, fill #0

## 2024-06-22 ENCOUNTER — Telehealth: Payer: Self-pay | Admitting: Cardiology

## 2024-06-22 ENCOUNTER — Other Ambulatory Visit: Payer: Self-pay

## 2024-06-22 ENCOUNTER — Other Ambulatory Visit (HOSPITAL_COMMUNITY): Payer: Self-pay

## 2024-06-22 DIAGNOSIS — R600 Localized edema: Secondary | ICD-10-CM

## 2024-06-22 MED ORDER — FUROSEMIDE 20 MG PO TABS
60.0000 mg | ORAL_TABLET | Freq: Two times a day (BID) | ORAL | 0 refills | Status: DC
  Filled 2024-06-22: qty 180, 30d supply, fill #0

## 2024-06-22 NOTE — Progress Notes (Unsigned)
  Electrophysiology Office Note:   Date:  06/23/2024  ID:  [name], DOB Mar 05, 1951, MRN [id number]  Primary Cardiologist: None Electrophysiologist: OLE ONEIDA HOLTS, MD   Electrophysiologist:  OLE ONEIDA HOLTS, MD      History of Present Illness:   [name] is a 73 y.o. female with h/o AF, severe arthritis, HTN, and chronic diastolic CHF seen today for routine electrophysiology followup.   Since last being seen in our clinic the patient reports doing OK. Mostly limited by back pain. Denies undue SOB. Has chronic peripheral edema. Otherwise, she denies chest pain, palpitations, dyspnea, PND, orthopnea, nausea, vomiting, dizziness, syncope, weight gain, or early satiety.   Review of systems complete and found to be negative unless listed in HPI.   EP Information / Studies Reviewed:    EKG is ordered today. Personal review as below.  EKG Interpretation Date/Time:  Wednesday June 23 2024 08:57:53 EST Ventricular Rate:  76 PR Interval:  188 QRS Duration:  100 QT Interval:  406 QTC Calculation: 456 R Axis:   -10  Text Interpretation: Normal sinus rhythm Incomplete right bundle branch block When compared with ECG of 19-Jan-2023 00:38, Premature ventricular complexes are no longer Present Incomplete right bundle branch block is now Present Confirmed by Lesia Sharper [phone]) on 06/23/2024 9:14:33 AM    Arrhythmia/Device History S/p PVI 04/16/2022 S/p Watchman 10/04/2023   Physical Exam:   VS:  BP 138/74   Pulse 76   Ht 5' 6 (1.676 m)   Wt 282 lb (127.9 kg)   SpO2 98%   BMI 45.52 kg/m    Wt Readings from Last 3 Encounters:  06/23/24 282 lb (127.9 kg)  04/29/24 285 lb 4 oz (129.4 kg)  03/15/24 283 lb (128.4 kg)     GEN: No acute distress NECK: No JVD; No carotid bruits CARDIAC: Regular rate and rhythm, no murmurs, rubs, gallops RESPIRATORY:  Clear to auscultation without rales, wheezing or rhonchi  ABDOMEN: Soft, non-tender, non-distended EXTREMITIES:   No edema; No deformity   ASSESSMENT AND PLAN:    Persistent AF Secondary hypercoagulable state Presence of Watchman EKG today shows NSR Off OAC s/p WAtchman as above. No obvious recurrence by symptoms  Chronic diastolic CHF Volume status stable Continue lasix  60 mg BID Labs today Reinforced fluid restriction to < 2 L daily, sodium restriction to less than 2000 mg daily, and the importance of daily weights.    Follow up with EP Team in 12 months  Signed, Sharper Prentice Lesia, PA-C

## 2024-06-22 NOTE — Telephone Encounter (Signed)
*  STAT* If patient is at the pharmacy, call can be transferred to refill team.   1. Which medications need to be refilled? (please list name of each medication and dose if known)   furosemide  (LASIX ) 20 MG tablet     2. Would you like to learn more about the convenience, safety, & potential cost savings by using the [facility]? no   3. Are you open to using the [facility] ([facility] no    4. Which pharmacy/location (including street and city if local pharmacy) is medication to be sent to?[place] [facility]    5. Do they need a 30 day or 90 day supply? 90 day    Pt is out of medication and has upcoming appt 06/23/24

## 2024-06-23 ENCOUNTER — Encounter: Payer: Self-pay | Admitting: Student

## 2024-06-23 ENCOUNTER — Ambulatory Visit: Attending: Student | Admitting: Student

## 2024-06-23 ENCOUNTER — Other Ambulatory Visit (HOSPITAL_BASED_OUTPATIENT_CLINIC_OR_DEPARTMENT_OTHER): Payer: Self-pay

## 2024-06-23 VITALS — BP 138/74 | HR 76 | Ht 66.0 in | Wt 282.0 lb

## 2024-06-23 DIAGNOSIS — I5032 Chronic diastolic (congestive) heart failure: Secondary | ICD-10-CM | POA: Diagnosis not present

## 2024-06-23 DIAGNOSIS — Z95818 Presence of other cardiac implants and grafts: Secondary | ICD-10-CM

## 2024-06-23 DIAGNOSIS — I4819 Other persistent atrial fibrillation: Secondary | ICD-10-CM | POA: Diagnosis not present

## 2024-06-23 DIAGNOSIS — R600 Localized edema: Secondary | ICD-10-CM

## 2024-06-23 MED ORDER — FUROSEMIDE 20 MG PO TABS
60.0000 mg | ORAL_TABLET | Freq: Two times a day (BID) | ORAL | 6 refills | Status: DC
  Filled 2024-06-23: qty 180, 30d supply, fill #0
  Filled 2024-07-17: qty 180, 90d supply, fill #0
  Filled 2024-08-17 – 2024-08-19 (×2): qty 180, 90d supply, fill #1
  Filled 2024-08-20: qty 180, 30d supply, fill #1

## 2024-06-23 NOTE — Patient Instructions (Signed)
 Medication Instructions:  Your physician recommends that you continue on your current medications as directed. Please refer to the Current Medication list given to you today.  *If you need a refill on your cardiac medications before your next appointment, please call your pharmacy*  Lab Work: BMET-TODAY If you have labs (blood work) drawn today and your tests are completely normal, you will receive your results only by: MyChart Message (if you have MyChart) OR A paper copy in the mail If you have any lab test that is abnormal or we need to change your treatment, we will call you to review the results.  Follow-Up: At [facility], you and your health needs are our priority.  As part of our continuing mission to provide you with exceptional heart care, our providers are all part of one team.  This team includes your primary Cardiologist (physician) and Advanced Practice Providers or APPs (Physician Assistants and Nurse Practitioners) who all work together to provide you with the care you need, when you need it.  Your next appointment:   1 year(s)  Provider:   Ozell Jodie Passey, PA-C

## 2024-06-24 LAB — BASIC METABOLIC PANEL WITH GFR
BUN/Creatinine Ratio: 14 (ref 12–28)
BUN: 16 mg/dL (ref 8–27)
CO2: 23 mmol/L (ref 20–29)
Calcium: 9.4 mg/dL (ref 8.7–10.3)
Chloride: 100 mmol/L (ref 96–106)
Creatinine, Ser: 1.13 mg/dL — AB (ref 0.57–1.00)
Glucose: 103 mg/dL — AB (ref 70–99)
Potassium: 3.9 mmol/L (ref 3.5–5.2)
Sodium: 144 mmol/L (ref 134–144)
eGFR: 51 mL/min/1.73 — AB (ref >59)

## 2024-06-25 ENCOUNTER — Ambulatory Visit: Payer: Self-pay | Admitting: Student

## 2024-07-07 ENCOUNTER — Other Ambulatory Visit (HOSPITAL_COMMUNITY): Payer: Self-pay

## 2024-07-09 ENCOUNTER — Other Ambulatory Visit (HOSPITAL_COMMUNITY): Payer: Self-pay

## 2024-07-16 ENCOUNTER — Other Ambulatory Visit: Payer: Self-pay | Admitting: Family Medicine

## 2024-07-16 DIAGNOSIS — I10 Essential (primary) hypertension: Secondary | ICD-10-CM

## 2024-07-17 ENCOUNTER — Other Ambulatory Visit (HOSPITAL_BASED_OUTPATIENT_CLINIC_OR_DEPARTMENT_OTHER): Payer: Self-pay

## 2024-07-19 ENCOUNTER — Other Ambulatory Visit: Payer: Self-pay | Admitting: Family Medicine

## 2024-07-19 ENCOUNTER — Other Ambulatory Visit (HOSPITAL_BASED_OUTPATIENT_CLINIC_OR_DEPARTMENT_OTHER): Payer: Self-pay

## 2024-07-19 ENCOUNTER — Other Ambulatory Visit (HOSPITAL_COMMUNITY): Payer: Self-pay

## 2024-07-19 DIAGNOSIS — I10 Essential (primary) hypertension: Secondary | ICD-10-CM

## 2024-07-19 MED ORDER — METOPROLOL SUCCINATE ER 50 MG PO TB24
50.0000 mg | ORAL_TABLET | Freq: Two times a day (BID) | ORAL | 0 refills | Status: DC
  Filled 2024-07-19: qty 60, 30d supply, fill #0

## 2024-07-20 ENCOUNTER — Other Ambulatory Visit (HOSPITAL_COMMUNITY): Payer: Self-pay

## 2024-07-20 ENCOUNTER — Other Ambulatory Visit (HOSPITAL_BASED_OUTPATIENT_CLINIC_OR_DEPARTMENT_OTHER): Payer: Self-pay

## 2024-07-20 ENCOUNTER — Other Ambulatory Visit: Payer: Self-pay

## 2024-08-10 ENCOUNTER — Telehealth: Payer: Self-pay

## 2024-08-10 NOTE — Telephone Encounter (Signed)
 Left voicemail for patient to return call to see how she is doing s/p LAAO 10/03/2022.

## 2024-08-16 ENCOUNTER — Inpatient Hospital Stay: Admitting: Psychiatry

## 2024-08-16 NOTE — Telephone Encounter (Signed)
 Patient returned call. She had LAAO on 10/03/2022. The patient reports doing well with no issues. The patient understands to call with questions or concerns.

## 2024-08-16 NOTE — Telephone Encounter (Signed)
Left second voicemail for patient to return call

## 2024-08-17 ENCOUNTER — Other Ambulatory Visit: Payer: Self-pay | Admitting: Family Medicine

## 2024-08-17 ENCOUNTER — Other Ambulatory Visit (HOSPITAL_COMMUNITY): Payer: Self-pay

## 2024-08-17 DIAGNOSIS — I10 Essential (primary) hypertension: Secondary | ICD-10-CM

## 2024-08-18 ENCOUNTER — Other Ambulatory Visit (HOSPITAL_COMMUNITY): Payer: Self-pay

## 2024-08-18 ENCOUNTER — Other Ambulatory Visit: Payer: Self-pay

## 2024-08-18 MED ORDER — LOSARTAN POTASSIUM 100 MG PO TABS
100.0000 mg | ORAL_TABLET | Freq: Every day | ORAL | 0 refills | Status: AC
  Filled 2024-08-18 – 2024-09-15 (×4): qty 30, 30d supply, fill #0

## 2024-08-18 MED ORDER — METOPROLOL SUCCINATE ER 50 MG PO TB24
50.0000 mg | ORAL_TABLET | Freq: Two times a day (BID) | ORAL | 0 refills | Status: DC
  Filled 2024-08-18: qty 60, 30d supply, fill #0

## 2024-08-19 ENCOUNTER — Other Ambulatory Visit (HOSPITAL_BASED_OUTPATIENT_CLINIC_OR_DEPARTMENT_OTHER): Payer: Self-pay

## 2024-08-20 ENCOUNTER — Other Ambulatory Visit (HOSPITAL_COMMUNITY): Payer: Self-pay

## 2024-08-20 ENCOUNTER — Other Ambulatory Visit (HOSPITAL_BASED_OUTPATIENT_CLINIC_OR_DEPARTMENT_OTHER): Payer: Self-pay

## 2024-08-20 ENCOUNTER — Telehealth: Payer: Self-pay | Admitting: Cardiology

## 2024-08-20 DIAGNOSIS — R600 Localized edema: Secondary | ICD-10-CM

## 2024-08-20 DIAGNOSIS — I5032 Chronic diastolic (congestive) heart failure: Secondary | ICD-10-CM

## 2024-08-20 NOTE — Telephone Encounter (Signed)
" °*  STAT* If patient is at the pharmacy, call can be transferred to refill team.   1. Which medications need to be refilled? (please list name of each medication and dose if known) furosemide  (LASIX ) 20 MG tablet    2. Would you like to learn more about the convenience, safety, & potential cost savings by using the [facility]? No   3. Are you open to using the [facility] ([facility] ) No   4. Which pharmacy/location (including street and city if local pharmacy) is medication to be sent to?  [facility] Phone: [phone]            5. Do they need a 30 day or 90 day supply? 90 day  Pt is will not have enough meds to last through weekend  "

## 2024-08-23 ENCOUNTER — Telehealth: Payer: Self-pay

## 2024-08-23 ENCOUNTER — Other Ambulatory Visit (HOSPITAL_BASED_OUTPATIENT_CLINIC_OR_DEPARTMENT_OTHER): Payer: Self-pay

## 2024-08-23 MED ORDER — FUROSEMIDE 20 MG PO TABS
60.0000 mg | ORAL_TABLET | Freq: Two times a day (BID) | ORAL | 3 refills | Status: AC
  Filled 2024-08-23 – 2024-09-15 (×3): qty 540, 90d supply, fill #0

## 2024-08-23 NOTE — Telephone Encounter (Signed)
 Pt's medication was sent to pt's pharmacy as requested. Confirmation received.

## 2024-08-23 NOTE — Telephone Encounter (Signed)
 Ms. Upperman called stating her husband recently had a heart attack, he is in therapy and she needs to reschedule her appointment with Dr.Newton on 1/12.   Next available scheduled on 2/9 @ 2:30. [name] agrees to date/time

## 2024-08-30 ENCOUNTER — Inpatient Hospital Stay: Admitting: Psychiatry

## 2024-09-13 ENCOUNTER — Other Ambulatory Visit: Payer: Self-pay | Admitting: Family Medicine

## 2024-09-13 ENCOUNTER — Other Ambulatory Visit (HOSPITAL_COMMUNITY): Payer: Self-pay

## 2024-09-13 ENCOUNTER — Other Ambulatory Visit: Payer: Self-pay

## 2024-09-13 DIAGNOSIS — I10 Essential (primary) hypertension: Secondary | ICD-10-CM

## 2024-09-13 MED ORDER — METOPROLOL SUCCINATE ER 50 MG PO TB24
50.0000 mg | ORAL_TABLET | Freq: Two times a day (BID) | ORAL | 0 refills | Status: AC
  Filled 2024-09-13 – 2024-09-15 (×2): qty 60, 30d supply, fill #0

## 2024-09-13 NOTE — Telephone Encounter (Signed)
 I will fill but patient needs her 6 months follow up visit for February -- she will need her annual blood work done at this visit also. Thanks!

## 2024-09-15 ENCOUNTER — Other Ambulatory Visit (HOSPITAL_COMMUNITY): Payer: Self-pay

## 2024-09-15 ENCOUNTER — Other Ambulatory Visit (HOSPITAL_BASED_OUTPATIENT_CLINIC_OR_DEPARTMENT_OTHER): Payer: Self-pay

## 2024-09-23 ENCOUNTER — Encounter: Payer: Self-pay | Admitting: Psychiatry

## 2024-09-27 ENCOUNTER — Inpatient Hospital Stay: Admitting: Psychiatry

## 2024-09-27 DIAGNOSIS — C538 Malignant neoplasm of overlapping sites of cervix uteri: Secondary | ICD-10-CM

## 2024-10-28 ENCOUNTER — Ambulatory Visit: Admitting: Radiation Oncology

## 2025-01-18 ENCOUNTER — Ambulatory Visit: Admitting: Neurology
